# Patient Record
Sex: Female | Born: 1970
Health system: Southern US, Community
[De-identification: ages and names within clinical notes are randomized; demographics above are authoritative.]

## PROBLEM LIST (undated history)

## (undated) DIAGNOSIS — K219 Gastro-esophageal reflux disease without esophagitis: Secondary | ICD-10-CM

## (undated) DIAGNOSIS — I2699 Other pulmonary embolism without acute cor pulmonale: Secondary | ICD-10-CM

## (undated) DIAGNOSIS — K760 Fatty (change of) liver, not elsewhere classified: Secondary | ICD-10-CM

## (undated) DIAGNOSIS — J449 Chronic obstructive pulmonary disease, unspecified: Secondary | ICD-10-CM

## (undated) DIAGNOSIS — R51 Headache: Secondary | ICD-10-CM

## (undated) DIAGNOSIS — I493 Ventricular premature depolarization: Secondary | ICD-10-CM

## (undated) DIAGNOSIS — E119 Type 2 diabetes mellitus without complications: Secondary | ICD-10-CM

## (undated) DIAGNOSIS — A419 Sepsis, unspecified organism: Secondary | ICD-10-CM

## (undated) DIAGNOSIS — R0602 Shortness of breath: Secondary | ICD-10-CM

## (undated) DIAGNOSIS — A159 Respiratory tuberculosis unspecified: Secondary | ICD-10-CM

## (undated) DIAGNOSIS — E538 Deficiency of other specified B group vitamins: Secondary | ICD-10-CM

## (undated) DIAGNOSIS — I739 Peripheral vascular disease, unspecified: Secondary | ICD-10-CM

## (undated) DIAGNOSIS — M1990 Unspecified osteoarthritis, unspecified site: Secondary | ICD-10-CM

## (undated) DIAGNOSIS — F419 Anxiety disorder, unspecified: Secondary | ICD-10-CM

## (undated) DIAGNOSIS — J45909 Unspecified asthma, uncomplicated: Secondary | ICD-10-CM

## (undated) DIAGNOSIS — I509 Heart failure, unspecified: Secondary | ICD-10-CM

## (undated) DIAGNOSIS — R7989 Other specified abnormal findings of blood chemistry: Secondary | ICD-10-CM

## (undated) DIAGNOSIS — E559 Vitamin D deficiency, unspecified: Secondary | ICD-10-CM

## (undated) DIAGNOSIS — F32A Depression, unspecified: Secondary | ICD-10-CM

## (undated) DIAGNOSIS — I10 Essential (primary) hypertension: Secondary | ICD-10-CM

## (undated) DIAGNOSIS — D649 Anemia, unspecified: Secondary | ICD-10-CM

## (undated) DIAGNOSIS — I209 Angina pectoris, unspecified: Secondary | ICD-10-CM

## (undated) DIAGNOSIS — F329 Major depressive disorder, single episode, unspecified: Secondary | ICD-10-CM

## (undated) HISTORY — DX: Vitamin D deficiency, unspecified: E55.9

## (undated) HISTORY — DX: Other specified abnormal findings of blood chemistry: R79.89

## (undated) HISTORY — DX: Essential (primary) hypertension: I10

## (undated) HISTORY — DX: Type 2 diabetes mellitus without complications: E11.9

## (undated) HISTORY — DX: Other pulmonary embolism without acute cor pulmonale: I26.99

## (undated) HISTORY — DX: Deficiency of other specified B group vitamins: E53.8

## (undated) HISTORY — DX: Ventricular premature depolarization: I49.3

## (undated) HISTORY — DX: Heart failure, unspecified: I50.9

## (undated) HISTORY — PX: CHOLECYSTECTOMY: SHX55

## (undated) HISTORY — PX: DIAGNOSTIC LAPAROSCOPY: SUR761

## (undated) HISTORY — DX: Peripheral vascular disease, unspecified: I73.9

## (undated) HISTORY — PX: APPENDECTOMY: SHX54

---

## 2005-04-01 ENCOUNTER — Ambulatory Visit: Payer: Self-pay | Admitting: Cardiology

## 2011-07-28 DIAGNOSIS — J189 Pneumonia, unspecified organism: Secondary | ICD-10-CM

## 2011-07-28 HISTORY — DX: Pneumonia, unspecified organism: J18.9

## 2011-07-28 HISTORY — PX: APPENDECTOMY: SHX54

## 2012-01-28 ENCOUNTER — Inpatient Hospital Stay (HOSPITAL_COMMUNITY): Payer: Medicaid - Out of State

## 2012-01-28 ENCOUNTER — Inpatient Hospital Stay (HOSPITAL_COMMUNITY)
Admission: AD | Admit: 2012-01-28 | Discharge: 2012-02-06 | DRG: 862 | Disposition: A | Discharge status: Home / Self Care | Payer: Medicaid - Out of State | Source: Other Acute Inpatient Hospital | Attending: Internal Medicine | Admitting: Internal Medicine

## 2012-01-28 ENCOUNTER — Encounter (HOSPITAL_COMMUNITY): Payer: Self-pay | Admitting: *Deleted

## 2012-01-28 DIAGNOSIS — K35209 Acute appendicitis with generalized peritonitis, without abscess, unspecified as to perforation: Secondary | ICD-10-CM | POA: Diagnosis present

## 2012-01-28 DIAGNOSIS — E876 Hypokalemia: Secondary | ICD-10-CM | POA: Diagnosis present

## 2012-01-28 DIAGNOSIS — A419 Sepsis, unspecified organism: Secondary | ICD-10-CM | POA: Diagnosis present

## 2012-01-28 DIAGNOSIS — Y836 Removal of other organ (partial) (total) as the cause of abnormal reaction of the patient, or of later complication, without mention of misadventure at the time of the procedure: Secondary | ICD-10-CM | POA: Diagnosis present

## 2012-01-28 DIAGNOSIS — J9601 Acute respiratory failure with hypoxia: Secondary | ICD-10-CM | POA: Diagnosis present

## 2012-01-28 DIAGNOSIS — Y9210 Unspecified residential institution as the place of occurrence of the external cause: Secondary | ICD-10-CM | POA: Diagnosis present

## 2012-01-28 DIAGNOSIS — J189 Pneumonia, unspecified organism: Secondary | ICD-10-CM | POA: Diagnosis present

## 2012-01-28 DIAGNOSIS — T8140XA Infection following a procedure, unspecified, initial encounter: Principal | ICD-10-CM | POA: Diagnosis present

## 2012-01-28 DIAGNOSIS — R6520 Severe sepsis without septic shock: Secondary | ICD-10-CM | POA: Diagnosis present

## 2012-01-28 DIAGNOSIS — J4489 Other specified chronic obstructive pulmonary disease: Secondary | ICD-10-CM | POA: Diagnosis present

## 2012-01-28 DIAGNOSIS — J9589 Other postprocedural complications and disorders of respiratory system, not elsewhere classified: Secondary | ICD-10-CM

## 2012-01-28 DIAGNOSIS — Z6832 Body mass index (BMI) 32.0-32.9, adult: Secondary | ICD-10-CM

## 2012-01-28 DIAGNOSIS — K219 Gastro-esophageal reflux disease without esophagitis: Secondary | ICD-10-CM | POA: Diagnosis present

## 2012-01-28 DIAGNOSIS — F3289 Other specified depressive episodes: Secondary | ICD-10-CM | POA: Diagnosis present

## 2012-01-28 DIAGNOSIS — E46 Unspecified protein-calorie malnutrition: Secondary | ICD-10-CM | POA: Diagnosis present

## 2012-01-28 DIAGNOSIS — F329 Major depressive disorder, single episode, unspecified: Secondary | ICD-10-CM | POA: Diagnosis present

## 2012-01-28 DIAGNOSIS — E119 Type 2 diabetes mellitus without complications: Secondary | ICD-10-CM | POA: Diagnosis present

## 2012-01-28 DIAGNOSIS — J80 Acute respiratory distress syndrome: Secondary | ICD-10-CM

## 2012-01-28 DIAGNOSIS — J9600 Acute respiratory failure, unspecified whether with hypoxia or hypercapnia: Secondary | ICD-10-CM

## 2012-01-28 DIAGNOSIS — K37 Unspecified appendicitis: Secondary | ICD-10-CM | POA: Diagnosis present

## 2012-01-28 DIAGNOSIS — D649 Anemia, unspecified: Secondary | ICD-10-CM | POA: Diagnosis present

## 2012-01-28 DIAGNOSIS — J449 Chronic obstructive pulmonary disease, unspecified: Secondary | ICD-10-CM | POA: Diagnosis present

## 2012-01-28 DIAGNOSIS — I739 Peripheral vascular disease, unspecified: Secondary | ICD-10-CM | POA: Diagnosis present

## 2012-01-28 DIAGNOSIS — M129 Arthropathy, unspecified: Secondary | ICD-10-CM | POA: Diagnosis present

## 2012-01-28 DIAGNOSIS — F411 Generalized anxiety disorder: Secondary | ICD-10-CM | POA: Diagnosis present

## 2012-01-28 DIAGNOSIS — J45909 Unspecified asthma, uncomplicated: Secondary | ICD-10-CM | POA: Diagnosis present

## 2012-01-28 DIAGNOSIS — K352 Acute appendicitis with generalized peritonitis, without abscess: Secondary | ICD-10-CM | POA: Diagnosis present

## 2012-01-28 DIAGNOSIS — R6521 Severe sepsis with septic shock: Secondary | ICD-10-CM | POA: Diagnosis present

## 2012-01-28 DIAGNOSIS — I10 Essential (primary) hypertension: Secondary | ICD-10-CM | POA: Diagnosis present

## 2012-01-28 HISTORY — DX: Anemia, unspecified: D64.9

## 2012-01-28 HISTORY — DX: Gastro-esophageal reflux disease without esophagitis: K21.9

## 2012-01-28 HISTORY — DX: Type 2 diabetes mellitus without complications: E11.9

## 2012-01-28 HISTORY — DX: Headache: R51

## 2012-01-28 HISTORY — DX: Anxiety disorder, unspecified: F41.9

## 2012-01-28 HISTORY — DX: Chronic obstructive pulmonary disease, unspecified: J44.9

## 2012-01-28 HISTORY — DX: Angina pectoris, unspecified: I20.9

## 2012-01-28 HISTORY — DX: Unspecified asthma, uncomplicated: J45.909

## 2012-01-28 HISTORY — DX: Essential (primary) hypertension: I10

## 2012-01-28 HISTORY — DX: Shortness of breath: R06.02

## 2012-01-28 HISTORY — DX: Major depressive disorder, single episode, unspecified: F32.9

## 2012-01-28 HISTORY — DX: Peripheral vascular disease, unspecified: I73.9

## 2012-01-28 HISTORY — DX: Unspecified osteoarthritis, unspecified site: M19.90

## 2012-01-28 HISTORY — DX: Depression, unspecified: F32.A

## 2012-01-28 HISTORY — DX: Respiratory tuberculosis unspecified: A15.9

## 2012-01-28 LAB — POCT I-STAT 3, ART BLOOD GAS (G3+)
TCO2: 18 mmol/L (ref 0–100)
pCO2 arterial: 34.6 mmHg — ABNORMAL LOW (ref 35.0–45.0)
pH, Arterial: 7.295 — ABNORMAL LOW (ref 7.350–7.450)
pO2, Arterial: 84 mmHg (ref 80.0–100.0)

## 2012-01-28 LAB — GLUCOSE, CAPILLARY: Glucose-Capillary: 236 mg/dL — ABNORMAL HIGH (ref 70–99)

## 2012-01-28 LAB — COMPREHENSIVE METABOLIC PANEL
ALT: 12 U/L (ref 0–35)
Alkaline Phosphatase: 112 U/L (ref 39–117)
BUN: 15 mg/dL (ref 6–23)
CO2: 17 mEq/L — ABNORMAL LOW (ref 19–32)
Chloride: 102 mEq/L (ref 96–112)
GFR calc Af Amer: >90 mL/min (ref >90)
GFR calc non Af Amer: >90 mL/min (ref >90)
Glucose, Bld: 272 mg/dL — ABNORMAL HIGH (ref 70–99)
Potassium: 4.7 mEq/L (ref 3.5–5.1)
Sodium: 135 mEq/L (ref 135–145)
Total Bilirubin: 0.1 mg/dL — ABNORMAL LOW (ref 0.3–1.2)
Total Protein: 5.2 g/dL — ABNORMAL LOW (ref 6.0–8.3)

## 2012-01-28 LAB — CBC WITH DIFFERENTIAL/PLATELET
Basophils Relative: 0 % (ref 0–1)
Eosinophils Absolute: 0 10*3/uL (ref 0.0–0.7)
HCT: 27.6 % — ABNORMAL LOW (ref 36.0–46.0)
Hemoglobin: 8.9 g/dL — ABNORMAL LOW (ref 12.0–15.0)
Lymphocytes Relative: 12 % (ref 12–46)
Lymphs Abs: 1.3 10*3/uL (ref 0.7–4.0)
MCH: 32.2 pg (ref 26.0–34.0)
MCHC: 32.2 g/dL (ref 30.0–36.0)
MCV: 100 fL (ref 78.0–100.0)
Neutro Abs: 9.6 10*3/uL — ABNORMAL HIGH (ref 1.7–7.7)
RDW: 15.2 % (ref 11.5–15.5)

## 2012-01-28 LAB — LACTIC ACID, PLASMA: Lactic Acid, Venous: 0.7 mmol/L (ref 0.5–2.2)

## 2012-01-28 LAB — LIPASE, BLOOD: Lipase: 41 U/L (ref 11–59)

## 2012-01-28 LAB — CARDIAC PANEL(CRET KIN+CKTOT+MB+TROPI)
CK, MB: 1.3 ng/mL (ref 0.3–4.0)
Total CK: 31 U/L (ref 7–177)
Troponin I: <0.3 ng/mL (ref <0.30)

## 2012-01-28 LAB — PROTIME-INR: INR: 1.23 (ref 0.00–1.49)

## 2012-01-28 MED ORDER — MIDAZOLAM HCL 2 MG/2ML IJ SOLN
INTRAMUSCULAR | Status: AC
  Administered 2012-01-28: 4 mg
  Filled 2012-01-28: qty 4

## 2012-01-28 MED ORDER — DEXTROSE 10 % IV SOLN: INTRAVENOUS | Status: DC | PRN

## 2012-01-28 MED ORDER — FENTANYL CITRATE 0.05 MG/ML IJ SOLN
INTRAMUSCULAR | Status: AC
  Administered 2012-01-28: 100 ug
  Filled 2012-01-28: qty 4

## 2012-01-28 MED ORDER — ADULT MULTIVITAMIN LIQUID CH
5.0000 mL | Freq: Every day | ORAL | Status: DC
  Administered 2012-01-28 – 2012-02-05 (×8): 5 mL
  Filled 2012-01-28 (×9): qty 5

## 2012-01-28 MED ORDER — OXEPA PO LIQD
1000.0000 mL | ORAL | Status: DC
  Administered 2012-01-28 – 2012-02-01 (×5): 1000 mL
  Filled 2012-01-28 (×8): qty 1000

## 2012-01-28 MED ORDER — HEPARIN SODIUM (PORCINE) 5000 UNIT/ML IJ SOLN
5000.0000 [IU] | Freq: Three times a day (TID) | INTRAMUSCULAR | Status: DC
  Administered 2012-01-28 – 2012-02-06 (×28): 5000 [IU] via SUBCUTANEOUS
  Filled 2012-01-28 (×32): qty 1

## 2012-01-28 MED ORDER — NOREPINEPHRINE BITARTRATE 1 MG/ML IJ SOLN
2.0000 ug/min | INTRAVENOUS | Status: DC
  Administered 2012-01-28: 10 ug/min via INTRAVENOUS
  Administered 2012-01-28 – 2012-01-29 (×2): 8 ug/min via INTRAVENOUS
  Administered 2012-01-29: 15 ug/min via INTRAVENOUS
  Administered 2012-01-30: 8 ug/min via INTRAVENOUS
  Filled 2012-01-28 (×10): qty 4

## 2012-01-28 MED ORDER — SODIUM CHLORIDE 0.9 % IV SOLN
2.0000 mg/h | INTRAVENOUS | Status: DC
  Administered 2012-01-28: 2 mg/h via INTRAVENOUS
  Administered 2012-01-28 – 2012-01-29 (×4): 4 mg/h via INTRAVENOUS
  Administered 2012-01-29: 8 mg/h via INTRAVENOUS
  Administered 2012-01-30 (×2): 4 mg/h via INTRAVENOUS
  Administered 2012-01-31: 5 mg/h via INTRAVENOUS
  Administered 2012-01-31: 4 mg/h via INTRAVENOUS
  Administered 2012-02-01 (×4): 8 mg/h via INTRAVENOUS
  Administered 2012-02-02: 5 mg/h via INTRAVENOUS
  Administered 2012-02-02: 2.5 mg/h via INTRAVENOUS
  Administered 2012-02-02: 3 mg/h via INTRAVENOUS
  Administered 2012-02-02: 10 mg/h via INTRAVENOUS
  Filled 2012-01-28 (×19): qty 10

## 2012-01-28 MED ORDER — FUROSEMIDE 10 MG/ML IJ SOLN
40.0000 mg | Freq: Once | INTRAMUSCULAR | Status: AC
  Administered 2012-01-28: 40 mg via INTRAVENOUS
  Filled 2012-01-28: qty 4

## 2012-01-28 MED ORDER — PRO-STAT SUGAR FREE PO LIQD
30.0000 mL | Freq: Every day | ORAL | Status: DC
  Administered 2012-01-30 – 2012-02-02 (×4): 30 mL
  Filled 2012-01-28 (×8): qty 30

## 2012-01-28 MED ORDER — INSULIN ASPART 100 UNIT/ML ~~LOC~~ SOLN
0.0000 [IU] | SUBCUTANEOUS | Status: DC
  Administered 2012-01-28 (×2): 10 [IU] via SUBCUTANEOUS

## 2012-01-28 MED ORDER — CHLORHEXIDINE GLUCONATE 0.12 % MT SOLN
15.0000 mL | Freq: Two times a day (BID) | OROMUCOSAL | Status: DC
  Administered 2012-01-28 – 2012-02-04 (×14): 15 mL via OROMUCOSAL
  Filled 2012-01-28 (×16): qty 15

## 2012-01-28 MED ORDER — PRO-STAT SUGAR FREE PO LIQD
60.0000 mL | Freq: Three times a day (TID) | ORAL | Status: DC
  Administered 2012-01-28 – 2012-02-03 (×18): 60 mL
  Filled 2012-01-28 (×24): qty 60

## 2012-01-28 MED ORDER — IOHEXOL 300 MG/ML  SOLN
100.0000 mL | Freq: Once | INTRAMUSCULAR | Status: AC | PRN
  Administered 2012-01-28: 100 mL via INTRAVENOUS

## 2012-01-28 MED ORDER — SODIUM CHLORIDE 0.9 % IV SOLN
500.0000 mg | Freq: Four times a day (QID) | INTRAVENOUS | Status: AC
  Administered 2012-01-28 – 2012-02-06 (×36): 500 mg via INTRAVENOUS
  Filled 2012-01-28 (×37): qty 500

## 2012-01-28 MED ORDER — FAMOTIDINE IN NACL 20-0.9 MG/50ML-% IV SOLN
20.0000 mg | Freq: Two times a day (BID) | INTRAVENOUS | Status: DC
  Administered 2012-01-28 – 2012-01-29 (×3): 20 mg via INTRAVENOUS
  Filled 2012-01-28 (×4): qty 50

## 2012-01-28 MED ORDER — SODIUM CHLORIDE 0.9 % IV SOLN
INTRAVENOUS | Status: AC
  Administered 2012-01-28: 06:00:00 via INTRAVENOUS
  Administered 2012-01-29 (×2): 20 mL/h via INTRAVENOUS

## 2012-01-28 MED ORDER — MIDAZOLAM BOLUS VIA INFUSION
1.0000 mg | INTRAVENOUS | Status: DC | PRN
  Filled 2012-01-28 (×2): qty 2

## 2012-01-28 MED ORDER — VANCOMYCIN HCL IN DEXTROSE 1-5 GM/200ML-% IV SOLN
1000.0000 mg | Freq: Three times a day (TID) | INTRAVENOUS | Status: DC
  Administered 2012-01-28 – 2012-01-30 (×7): 1000 mg via INTRAVENOUS
  Filled 2012-01-28 (×10): qty 200

## 2012-01-28 MED ORDER — INSULIN ASPART 100 UNIT/ML ~~LOC~~ SOLN
3.0000 [IU] | SUBCUTANEOUS | Status: DC
  Administered 2012-01-28 (×2): 3 [IU] via SUBCUTANEOUS

## 2012-01-28 MED ORDER — ACETAMINOPHEN 160 MG/5ML PO SOLN
650.0000 mg | Freq: Four times a day (QID) | ORAL | Status: DC | PRN
  Administered 2012-01-28 – 2012-01-29 (×2): 650 mg
  Filled 2012-01-28 (×3): qty 20.3

## 2012-01-28 MED ORDER — BIOTENE DRY MOUTH MT LIQD
15.0000 mL | Freq: Four times a day (QID) | OROMUCOSAL | Status: DC
  Administered 2012-01-29 – 2012-02-04 (×27): 15 mL via OROMUCOSAL

## 2012-01-28 MED ORDER — SODIUM CHLORIDE 0.9 % IV SOLN
250.0000 mL | INTRAVENOUS | Status: DC | PRN
  Administered 2012-01-30: 250 mL via INTRAVENOUS

## 2012-01-28 MED ORDER — INSULIN ASPART 100 UNIT/ML ~~LOC~~ SOLN
0.0000 [IU] | SUBCUTANEOUS | Status: DC
  Administered 2012-01-28 (×2): 4 [IU] via SUBCUTANEOUS

## 2012-01-28 MED ORDER — IOHEXOL 300 MG/ML  SOLN
20.0000 mL | INTRAMUSCULAR | Status: AC
  Administered 2012-01-28 (×2): 20 mL via ORAL

## 2012-01-28 MED ORDER — IPRATROPIUM-ALBUTEROL 18-103 MCG/ACT IN AERO
6.0000 | INHALATION_SPRAY | RESPIRATORY_TRACT | Status: DC
  Administered 2012-01-28 – 2012-02-03 (×37): 6 via RESPIRATORY_TRACT
  Filled 2012-01-28 (×2): qty 14.7

## 2012-01-28 MED ORDER — SODIUM CHLORIDE 0.9 % IV SOLN
50.0000 ug/h | INTRAVENOUS | Status: DC
  Administered 2012-01-28: 75 ug/h via INTRAVENOUS
  Administered 2012-01-28 – 2012-01-29 (×3): 200 ug/h via INTRAVENOUS
  Administered 2012-01-30 – 2012-01-31 (×2): 150 ug/h via INTRAVENOUS
  Administered 2012-01-31 – 2012-02-01 (×2): 200 ug/h via INTRAVENOUS
  Administered 2012-02-01: 250 ug/h via INTRAVENOUS
  Administered 2012-02-02: 125 ug/h via INTRAVENOUS
  Administered 2012-02-02: 100 ug/h via INTRAVENOUS
  Administered 2012-02-03: 200 ug/h via INTRAVENOUS
  Filled 2012-01-28 (×13): qty 50

## 2012-01-28 MED ORDER — FENTANYL BOLUS VIA INFUSION
50.0000 ug | Freq: Four times a day (QID) | INTRAVENOUS | Status: DC | PRN
  Filled 2012-01-28: qty 100

## 2012-01-28 NOTE — Progress Notes (Addendum)
INITIAL ADULT NUTRITION ASSESSMENT Date: 01/28/2012   Time: 9:29 AM Reason for Assessment: TF Management Consult  INTERVENTION: Initiate Oxepa @ 20 ml/hr via nasogastric feeding tube. 60 ml Prostat TID and 30 ml Prostat daily.  At goal rate, tube feeding regimen will provide 1420 kcal (22 kcal/kg IBW), 135 grams of protein (>100% of minimum needs), and 376 ml of H2O.   Adult liquid MVI daily   ASSESSMENT: Female 41 y.o.  Dx: Acute respiratory failure with hypoxia  Hx:  Past Medical History  Diagnosis Date  . Peripheral vascular disease   . Hypertension   . Anginal pain   . Anxiety   . Depression   . Asthma   . COPD (chronic obstructive pulmonary disease)   . Shortness of breath   . Tuberculosis   . GERD (gastroesophageal reflux disease)   . Headache   . Arthritis   . Diabetes mellitus 01/28/2012   Past Surgical History  Procedure Date  . Diagnostic laparoscopy     Related Meds:     . albuterol-ipratropium  6 puff Inhalation Q4H  . famotidine (PEPCID) IV  20 mg Intravenous Q12H  . fentaNYL      . furosemide  40 mg Intravenous Once  . heparin  5,000 Units Subcutaneous Q8H  . imipenem-cilastatin  500 mg Intravenous Q6H  . insulin aspart  0-4 Units Subcutaneous Q4H  . insulin aspart  3 Units Subcutaneous Q4H  . iohexol  20 mL Oral Q1 Hr x 2  . midazolam      . vancomycin  1,000 mg Intravenous Q8H    Ht: 5\' 8"  (172.7 cm)  Wt: 215 lb 2.7 oz (97.6 kg)  Ideal Wt: 63.6 kg % Ideal Wt: 153%  Usual Wt: unknown  % Usual Wt:    Body mass index is 32.72 kg/(m^2). Obesity Class I  Food/Nutrition Related Hx: no family present. Pt sedated on vent. Per RN will start TF after pt has CT.   Labs:  CMP     Component Value Date/Time   NA 135 01/28/2012 0644   K 4.7 01/28/2012 0644   CL 102 01/28/2012 0644   CO2 17* 01/28/2012 0644   GLUCOSE 272* 01/28/2012 0644   BUN 15 01/28/2012 0644   CREATININE 0.63 01/28/2012 0644   CALCIUM 8.2* 01/28/2012 0644   PROT 5.2* 01/28/2012 0644   ALBUMIN 1.8* 01/28/2012 0644   AST 19 01/28/2012 0644   ALT 12 01/28/2012 0644   ALKPHOS 112 01/28/2012 0644   BILITOT 0.1* 01/28/2012 0644   GFRNONAA >90 01/28/2012 0644   GFRAA >90 01/28/2012 0644   CBG (last 3)   Basename 01/28/12 0745 01/28/12 0530  GLUCAP 236* 262*    No results found for this basename: phos   No results found for this basename: mg   No results found for this basename: HGBA1C    Intake/Output Summary (Last 24 hours) at 01/28/12 0932 Last data filed at 01/28/12 0800  Gross per 24 hour  Intake 139.55 ml  Output    285 ml  Net -145.45 ml    Diet Order: NPO  Supplements/Tube Feeding: none  IVF:    sodium chloride Last Rate: 20 mL/hr (01/28/12 0820)  dextrose   fentaNYL infusion INTRAVENOUS Last Rate: 200 mcg/hr (01/28/12 0815)  midazolam (VERSED) infusion Last Rate: 4 mg/hr (01/28/12 0800)   Pt with obesity and diabetes was admitted to [facility] on 7/1 with a ruptured appendix. She underwent a lap appy on 7/1 and on 7/3 developed  hypoxemic respiratory failure requiring intubation. She was transferred to [facility] for further management on 7/4. Pt with ARDS likely due to HCAP vs sepsis from appendicitis. Patient is currently intubated on ventilator support.  MV: 10.4 Temp: 37.1 C Pt with NG tube in place, no xray available.   Estimated Nutritional Needs:   Kcal:  1925 Protein:  >/=127 grams Fluid:  >2 L/day  NUTRITION DIAGNOSIS: -Inadequate oral intake (NI-2.1).  Status: Ongoing  RELATED TO: inability to eat  AS EVIDENCE BY: NPO status  MONITORING/EVALUATION(Goals): Goal: Enteral nutrition to provide 60-70% of estimated calorie needs (22-25 kcals/kg ideal body weight) and 100% of estimated protein needs, based on ASPEN guidelines for permissive underfeeding in critically ill obese individuals.  Monitor: TF tolerance, vent status, I&O  EDUCATION NEEDS: -No education needs identified at this time   DOCUMENTATION CODES Per approved criteria    -Obesity Unspecified    Kendell Bane RD, LDN, CNSC [phone] Pager [phone] After Hours Pager  01/28/2012, 9:29 AM

## 2012-01-28 NOTE — Progress Notes (Signed)
1742 Temp 100.6 Dr. Delford Field ordered Tylenol. RN will give when approved by pharmacy. Pls see MAR

## 2012-01-28 NOTE — Progress Notes (Signed)
1100 Discussed MAP <60 with Dr. Delford Field. Norepinephrine ordered to maintain MAP >65. Will start as soon as drip sent from pharmacy.   01/28/12 1100  Vitals  Pulse Rate 72   Pulse Rate Source Monitor  ECG Heart Rate 73   Cardiac Rhythm NSR  BP ! 100/43 mmHg  MAP (mmHg) 57   BP Location Left arm  BP Method Automatic  Patient Position, if appropriate Lying  Resp 16

## 2012-01-28 NOTE — Procedures (Signed)
Central Venous Catheter Insertion Procedure Note BLUE RUGGERIO [id number] 06/24/1971  Procedure: Insertion of Central Venous Catheter Indications: Assessment of intravascular volume and Drug and/or fluid administration  Procedure Details Consent: Risks of procedure as well as the alternatives and risks of each were explained to the (patient/caregiver).  Consent for procedure obtained. Time Out: Verified patient identification, verified procedure, site/side was marked, verified correct patient position, special equipment/implants available, medications/allergies/relevent history reviewed, required imaging and test results available.  Performed  Maximum sterile technique was used including antiseptics, cap, gloves, gown, hand hygiene, mask and sheet. Skin prep: Chlorhexidine; local anesthetic administered A antimicrobial bonded/coated triple lumen catheter was placed in the right internal jugular vein using the Seldinger technique. Ultrasound used for vessel identification.  Evaluation Blood flow good Complications: No apparent complications Patient did tolerate procedure well. Chest X-ray ordered to verify placement.  CXR: pending.  Naseem Varden 01/28/2012, 6:06 AM

## 2012-01-28 NOTE — Progress Notes (Signed)
ANTIBIOTIC CONSULT NOTE - INITIAL  Pharmacy Consult for Vancomycin/Primaxin Indication: rule out sepsis  No Known Allergies  Patient Measurements: Height: 5\' 8"  (172.7 cm) Weight: 215 lb 2.7 oz (97.6 kg) IBW/kg (Calculated) : 63.9  Adjusted Body Weight: 80  Vital Signs: Temp: 98.8 F (37.1 C) (07/04 0546) Temp src: Axillary (07/04 0546) BP: 101/47 mmHg (07/04 0645) Pulse Rate: 81  (07/04 0645) Intake/Output from previous day: 07/03 0701 - 07/04 0700 In: 55.1 [I.V.:55.1] Out: 185 [Urine:185] Intake/Output from this shift:    Labs (at [facility]): SCr 0.71  No results found for this basename: WBC:3,HGB:3,PLT:3,LABCREA:3,CREATININE:3 in the last 72 hours CrCl is unknown because no creatinine reading has been taken. No results found for this basename: VANCOTROUGH:2,VANCOPEAK:2,VANCORANDOM:2,GENTTROUGH:2,GENTPEAK:2,GENTRANDOM:2,TOBRATROUGH:2,TOBRAPEAK:2,TOBRARND:2,AMIKACINPEAK:2,AMIKACINTROU:2,AMIKACIN:2, in the last 72 hours   Microbiology: No results found for this or any previous visit (from the past 720 hour(s)).  Medical History: Past Medical History  Diagnosis Date  . Peripheral vascular disease   . Hypertension   . Anginal pain   . Anxiety   . Depression   . Asthma   . COPD (chronic obstructive pulmonary disease)   . Shortness of breath   . Tuberculosis   . GERD (gastroesophageal reflux disease)   . Headache   . Arthritis   . Diabetes mellitus 01/28/2012    Home Medications:  Metformin  Zantac  Lortab  Zestoretic  ASA  Coreg  Gabapentin  Assessment: 41 yo female with ARDS/sepsis for empiric antibiotics Antibiotics at [facility]: Vancomycin 1 g IV q8h   Last dose at 0330 7/4 Invanz 1 g IV q24h    Last dose at 2130 7/3 Azithromycin 250 mg IV q24h   Last dose at 2130 7/3  Goal of Therapy:  Vancomycin trough level 15-20 mcg/ml  Plan:  Continue Vancomycin 1g IV q8h Primaxin 500 mg IV q6h  Eddie Candle 01/28/2012,7:31 AM

## 2012-01-28 NOTE — H&P (Addendum)
Name: [name] MRN: [id number] DOB: [birth year]    LOS: 0  Referring Provider:  [facility] Reason for Referral:  Acute respiratory failure  PULMONARY / CRITICAL CARE MEDICINE  HPI:  This is a 41 y/o female with obesity and diabetes who was admitted to [facility] on 7/1 with a ruptured appendix.  She underwent a lap appy on 7/1 and on 7/3 developed hypoxemic respiratory failure requiring intubation.  She was transferred to [facility] for further management on 7/4.  Past Medical History  Diagnosis Date  . Peripheral vascular disease   . Hypertension   . Anginal pain   . Anxiety   . Depression   . Asthma   . COPD (chronic obstructive pulmonary disease)   . Shortness of breath   . Tuberculosis   . GERD (gastroesophageal reflux disease)   . Headache   . Arthritis    Past Surgical History  Procedure Date  . Diagnostic laparoscopy    Prior to Admission medications   Not on File   Allergies No Known Allergies  Family History History reviewed. No pertinent family history. Social History  does not have a smoking history on file. She does not have any smokeless tobacco history on file. She reports that she does not drink alcohol or use illicit drugs.  Review Of Systems:  Cannot obtain due to intubation  Brief patient description:  41 y/o female with DM2, who developed ARDS on 7/3 after a lap appy on 7/1 at [facility].  She was transferred to [facility] on 01/28/2012.  Events Since Admission: 7/3 CT Ab>> bibasilar airspace disease, fluid near appy site and drainage catheter, unremarkable small bowel, stomach, colon.    Current Status:  Vital Signs: Temp:  [98.8 F (37.1 C)] 98.8 F (37.1 C) (07/04 0546) Pulse Rate:  [82-93] 82  (07/04 0600) Resp:  [19-23] 19  (07/04 0600) BP: (103-117)/(47-59) 103/47 mmHg (07/04 0600) SpO2:  [96 %] 96 % (07/04 0600) FiO2 (%):  [100 %] 100 % (07/04 0600)  Physical Examination: Gen: sedated on vent HEENT: NCAT, PERRL,  EOMi PULM: Insp crackles L > R CV: RRR, no mgr, no JVD AB: BS infrequent, soft, nontender,  Ext: warm, no edema, no clubbing, no cyanosis Derm: no rash or skin breakdown Neuro: A&Ox4, CN II-XII intact, strength 5/5 in all 4 extremities   Principal Problem:  *Acute respiratory failure with hypoxia Active Problems:  Appendicitis  ARDS (adult respiratory distress syndrome)  Diabetes mellitus  Asthma  Hypertension   ASSESSMENT AND PLAN  PULMONARY No results found for this basename: PHART:5,PCO2:5,PCO2ART:5,PO2ART:5,HCO3:5,O2SAT:5 in the last 168 hours Ventilator Settings: Vent Mode:  [-]  FiO2 (%):  [100 %] 100 % CXR:  Bilateral airspace disease ETT:  7/3 >>  A:  ARDS likely due to HCAP vs. Sepsis from appendicitis; Asthma without exacerbation P:   -ARDS protocol -goal CVP < 4 -cont sedation protocol -see ID for antibiotics -daily WUA/SBT/CXR/ABG -scheduled bronchodilators  CARDIOVASCULAR No results found for this basename: TROPONINI:5,LATICACIDVEN:5, O2SATVEN:5,PROBNP:5 in the last 168 hours ECG:  pending Lines: 7/4 R IJ CVL >>  A: No acute issues P:  -Keep MAP > 65 -Keep CVP < 4 for ARDS  RENAL No results found for this basename: NA:5,K:2,CL:5,CO2:5,BUN:5,CREATININE:5,CALCIUM:5,MG:5,PHOS:5 in the last 168 hours Intake/Output    None    Foley:  7/4  A:  No acute issues P:   -BMET -follow uop  GASTROINTESTINAL No results found for this basename: AST:5,ALT:5,ALKPHOS:5,BILITOT:5,PROT:5,ALBUMIN:5 in the last 168 hours  A:  S/p Appy, protein calorie malnutrition P:   -tube feedings -follow JP drain output -see ID -check CMP  HEMATOLOGIC No results found for this basename: HGB:5,HCT:5,PLT:5,INR:5,APTT:5 in the last 168 hours A:  No acute issues P:  -follow cbc  INFECTIOUS No results found for this basename: WBC:5,PROCALCITON:5 in the last 168 hours Cultures: 7/2 resp >> 7/2 blood >> Antibiotics: 7/2 Vanc HCAP >> 7/2 Imi (HCAP/Appendicitis)  >>  A:  HCAP and appendicitis P:   -vanc/imi -follow blood and resp cultures -follow JP drain output  ENDOCRINE  Lab 01/28/12 0530  GLUCAP 262*   A:  DM2 P:   -ICU hyperglycemia protocol  NEUROLOGIC  A:  Sedation needs P:   -continuous sedation protocol  BEST PRACTICE / DISPOSITION Level of Care:  ICU Primary Service:  PCCM Consultants:   Code Status:  full Diet:  Enteral feedings DVT Px:  Sub q hep GI Px:  pepcid Skin Integrity:  normal Social / Family:  Need to update when they arrive on 7/4  CC time 60 minutes.  Max Fickle, M.D. Pulmonary and Critical Care Medicine [facility] Pager: [phone]  01/28/2012, 6:07 AM

## 2012-01-28 NOTE — Progress Notes (Signed)
1742 Arterial Line insertion by Earnest Rosier NP on 1st attempt.

## 2012-01-28 NOTE — Progress Notes (Signed)
1145 Norepinephrine gtt started to maintain MAP >65. Will continue to monitor patient throughout shift and alert oncoming RN of new gtt and MAP goal.

## 2012-01-28 NOTE — Consult Note (Signed)
[name] DOB: [birth year] MRN: [id number]                                                                                      DATE: 01/28/2012  PCP: No primary provider on file. Referring Provider: No ref. provider found  IMPRESSION:  S/P appendectomy with respiratory failure. Does not appear to have an intra-abdominal source of infection other than resolving appendicitis for appendectomy.  PLAN:   No surgical needs now. Will monitor drain output and abdominal exam.                    HPI:  [name] is a 41 y.o.  female who presents for evaluation of post op sepsis. She had an appendectomy a few days ago and has developed respiratory failure, sepsis. We are asked to evaluate for abdominal issues. She is on a vent and sedated and can't communicate at this point  PMH:  has a past medical history of Peripheral vascular disease; Hypertension; Anginal pain; Anxiety; Depression; Asthma; COPD (chronic obstructive pulmonary disease); Shortness of breath; Tuberculosis; GERD (gastroesophageal reflux disease); Headache; Arthritis; and Diabetes mellitus (01/28/2012).  PSH:   has past surgical history that includes Diagnostic laparoscopy.  ALLERGIES:  No Known Allergies  MEDICATIONS: Current facility-administered medications:0.9 %  sodium chloride infusion, 250 mL, Intravenous, PRN, Lupita Leash, MD;  0.9 %  sodium chloride infusion, , Intravenous, Continuous, Storm Frisk, MD, Last Rate: 20 mL/hr at 01/28/12 0820, 20 mL/hr at 01/28/12 0820;  albuterol-ipratropium (COMBIVENT) inhaler 6 puff, 6 puff, Inhalation, Q4H, Lupita Leash, MD, 6 puff at 01/28/12 1139 dextrose 10 % infusion, , Intravenous, Continuous PRN, Lupita Leash, MD;  famotidine (PEPCID) IVPB 20 mg, 20 mg, Intravenous, Q12H, Lupita Leash, MD, 20 mg at 01/28/12 1010;  feeding supplement (OXEPA) liquid 1,000 mL, 1,000 mL, Per Tube, Q24H, Heather Cornelison Pitts, RD;  feeding supplement (PRO-STAT SUGAR  FREE 64) liquid 30 mL, 30 mL, Per Tube, Daily, Heather Cornelison Pitts, RD feeding supplement (PRO-STAT SUGAR FREE 64) liquid 60 mL, 60 mL, Per Tube, TID, Heather Cornelison Pitts, RD, 60 mL at 01/28/12 1322;  fentaNYL (SUBLIMAZE) 0.05 MG/ML injection, , , , , 100 mcg at 01/28/12 0548;  fentaNYL (SUBLIMAZE) 10 mcg/mL in sodium chloride 0.9 % 250 mL infusion, 50-400 mcg/hr, Intravenous, Titrated, Zigmund Gottron, MD, Last Rate: 10 mL/hr at 01/28/12 0900, 100 mcg/hr at 01/28/12 0900 fentaNYL (SUBLIMAZE) bolus via infusion 50-100 mcg, 50-100 mcg, Intravenous, Q6H PRN, Zigmund Gottron, MD;  furosemide (LASIX) injection 40 mg, 40 mg, Intravenous, Once, Storm Frisk, MD, 40 mg at 01/28/12 0851;  heparin injection 5,000 Units, 5,000 Units, Subcutaneous, Q8H, Lupita Leash, MD, 5,000 Units at 01/28/12 1321 imipenem-cilastatin (PRIMAXIN) 500 mg in sodium chloride 0.9 % 100 mL IVPB, 500 mg, Intravenous, Q6H, Lupita Leash, MD, 500 mg at 01/28/12 0850;  insulin aspart (novoLOG) injection 0-4 Units, 0-4 Units, Subcutaneous, Q4H, Lupita Leash, MD, 4 Units at 01/28/12 1314;  insulin aspart (novoLOG) injection 3 Units, 3 Units, Subcutaneous, Q4H, Lupita Leash, MD, 3 Units at 01/28/12 1318 iohexol (OMNIPAQUE) 300 MG/ML solution 100  mL, 100 mL, Intravenous, Once PRN, Medication Radiologist, MD, 100 mL at 01/28/12 1248;  iohexol (OMNIPAQUE) 300 MG/ML solution 20 mL, 20 mL, Oral, Q1 Hr x 2, Medication Radiologist, MD, 20 mL at 01/28/12 1110;  midazolam (VERSED) 1 mg/mL in sodium chloride 0.9 % 50 mL infusion, 2-10 mg/hr, Intravenous, Titrated, Zigmund Gottron, MD, Last Rate: 3 mL/hr at 01/28/12 0900, 3 mg/hr at 01/28/12 0900 midazolam (VERSED) 2 MG/2ML injection, , , , , 4 mg at 01/28/12 0548;  midazolam (VERSED) bolus via infusion 1-2 mg, 1-2 mg, Intravenous, Q2H PRN, Zigmund Gottron, MD;  multivitamin liquid 5 mL, 5 mL, Per Tube, Daily, Heather Cornelison Pitts, RD, 5 mL at  01/28/12 1322 norepinephrine (LEVOPHED) 4 mg in dextrose 5 % 250 mL infusion, 2-50 mcg/min, Intravenous, Continuous, Storm Frisk, MD, Last Rate: 30 mL/hr at 01/28/12 1311, 8 mcg/min at 01/28/12 1311;  vancomycin (VANCOCIN) IVPB 1000 mg/200 mL premix, 1,000 mg, Intravenous, Q8H, Lupita Leash, MD, 1,000 mg at 01/28/12 1116  ROS: Not obtainable other than from prior notes  EXAM:   General: The patient is sedated on a ventilator. Vital signs noted. She is on low-dose Levophed. Pulse rate is 76. Lungs: Sounds clear to auscultation anteriorly. Abdomen: Soft. Seems nontender but difficult to assess due to sedation. Incisions are clean and healing nicely. The drain has serous material.  DATA REVIEWED:  I have reviewed her H&P other notes laboratory studies and CT scan. Of note is that the CT scan does not show evidence of any intra-abdominal abscess or infection as etiology for her septic picture.    [name] 01/28/2012  CC: No ref. provider found, No primary provider on file.

## 2012-01-28 NOTE — Progress Notes (Addendum)
Changes made per ards protocol increased RR to 18.

## 2012-01-28 NOTE — Procedures (Signed)
Arterial Catheter Insertion Procedure Note DEAMBER BUCKHALTER [id number] Mar 29, 1971  Procedure: Insertion of Arterial Catheter  Indications: Blood pressure monitoring and Frequent blood sampling  Procedure Details Consent: Risks of procedure as well as the alternatives and risks of each were explained to the (patient/caregiver).  Consent for procedure obtained. Time Out: Verified patient identification, verified procedure, site/side was marked, verified correct patient position, special equipment/implants available, medications/allergies/relevent history reviewed, required imaging and test results available.  Performed  Maximum sterile technique was used including antiseptics, cap, gloves, gown, hand hygiene, mask and sheet. Skin prep: Chlorhexidine; local anesthetic administered 20 gauge catheter was inserted into left radial artery using the Seldinger technique.  Evaluation Blood flow good; BP tracing good. Complications: No apparent complications.   Procedure performed under direct supervision of Dr. Delford Field and with ultrasound guidance.     Canary Brim, NP-C [facility] Pgr: [phone] or [phone]

## 2012-01-29 ENCOUNTER — Inpatient Hospital Stay (HOSPITAL_COMMUNITY): Payer: Medicaid - Out of State

## 2012-01-29 DIAGNOSIS — I509 Heart failure, unspecified: Secondary | ICD-10-CM

## 2012-01-29 LAB — COMPREHENSIVE METABOLIC PANEL
ALT: 10 U/L (ref 0–35)
AST: 18 U/L (ref 0–37)
Alkaline Phosphatase: 91 U/L (ref 39–117)
CO2: 21 mEq/L (ref 19–32)
Chloride: 104 mEq/L (ref 96–112)
GFR calc Af Amer: >90 mL/min (ref >90)
GFR calc non Af Amer: >90 mL/min (ref >90)
Glucose, Bld: 294 mg/dL — ABNORMAL HIGH (ref 70–99)
Potassium: 3.9 mEq/L (ref 3.5–5.1)
Sodium: 136 mEq/L (ref 135–145)
Total Bilirubin: 0.1 mg/dL — ABNORMAL LOW (ref 0.3–1.2)

## 2012-01-29 LAB — CBC
Hemoglobin: 9.3 g/dL — ABNORMAL LOW (ref 12.0–15.0)
MCHC: 31.1 g/dL (ref 30.0–36.0)
Platelets: 405 10*3/uL — ABNORMAL HIGH (ref 150–400)
Platelets: 368 10*3/uL (ref 150–400)
RBC: 2.74 MIL/uL — ABNORMAL LOW (ref 3.87–5.11)
RDW: 16.1 % — ABNORMAL HIGH (ref 11.5–15.5)
WBC: 14.2 10*3/uL — ABNORMAL HIGH (ref 4.0–10.5)

## 2012-01-29 LAB — GLUCOSE, CAPILLARY
Glucose-Capillary: 272 mg/dL — ABNORMAL HIGH (ref 70–99)
Glucose-Capillary: 260 mg/dL — ABNORMAL HIGH (ref 70–99)
Glucose-Capillary: 189 mg/dL — ABNORMAL HIGH (ref 70–99)
Glucose-Capillary: 128 mg/dL — ABNORMAL HIGH (ref 70–99)
Glucose-Capillary: 145 mg/dL — ABNORMAL HIGH (ref 70–99)
Glucose-Capillary: 175 mg/dL — ABNORMAL HIGH (ref 70–99)
Glucose-Capillary: 186 mg/dL — ABNORMAL HIGH (ref 70–99)
Glucose-Capillary: 251 mg/dL — ABNORMAL HIGH (ref 70–99)

## 2012-01-29 LAB — CREATININE, SERUM
Creatinine, Ser: 0.77 mg/dL (ref 0.50–1.10)
GFR calc non Af Amer: >90 mL/min (ref >90)

## 2012-01-29 LAB — PHOSPHORUS: Phosphorus: 3.3 mg/dL (ref 2.3–4.6)

## 2012-01-29 LAB — POCT I-STAT 3, ART BLOOD GAS (G3+)
Acid-base deficit: 8 mmol/L — ABNORMAL HIGH (ref 0.0–2.0)
O2 Saturation: 97 %
TCO2: 20 mmol/L (ref 0–100)
pCO2 arterial: 44.5 mmHg (ref 35.0–45.0)

## 2012-01-29 MED ORDER — INSULIN ASPART 100 UNIT/ML ~~LOC~~ SOLN
0.0000 [IU] | Freq: Three times a day (TID) | SUBCUTANEOUS | Status: DC
  Administered 2012-01-29: 2 [IU] via SUBCUTANEOUS

## 2012-01-29 MED ORDER — SODIUM CHLORIDE 0.9 % IV SOLN
100.0000 mg | Freq: Every day | INTRAVENOUS | Status: DC
  Administered 2012-01-29 – 2012-02-02 (×5): 100 mg via INTRAVENOUS
  Filled 2012-01-29 (×6): qty 100

## 2012-01-29 MED ORDER — INSULIN GLARGINE 100 UNIT/ML ~~LOC~~ SOLN
10.0000 [IU] | Freq: Every day | SUBCUTANEOUS | Status: DC
  Administered 2012-01-29 – 2012-02-03 (×6): 10 [IU] via SUBCUTANEOUS

## 2012-01-29 MED ORDER — FUROSEMIDE 10 MG/ML IJ SOLN
20.0000 mg | INTRAMUSCULAR | Status: DC | PRN
  Administered 2012-01-31 – 2012-02-01 (×4): 40 mg via INTRAVENOUS
  Administered 2012-02-03 (×2): 20 mg via INTRAVENOUS
  Filled 2012-01-29 (×5): qty 4
  Filled 2012-01-29: qty 8

## 2012-01-29 MED ORDER — STUDY - INVESTIGATIONAL DRUG SIMPLE RECORD
40.0000 mg | Freq: Once | Status: AC
  Administered 2012-01-29: 40 mg via ORAL
  Filled 2012-01-29: qty 40

## 2012-01-29 MED ORDER — FAMOTIDINE 40 MG/5ML PO SUSR
20.0000 mg | Freq: Two times a day (BID) | ORAL | Status: DC
  Administered 2012-01-29 – 2012-02-02 (×9): 20 mg
  Filled 2012-01-29 (×11): qty 2.5

## 2012-01-29 MED ORDER — VECURONIUM BOLUS VIA INFUSION
0.0800 mg/kg | Freq: Once | INTRAVENOUS | Status: AC
  Administered 2012-01-29: 8.3 mg via INTRAVENOUS
  Filled 2012-01-29: qty 9

## 2012-01-29 MED ORDER — STUDY - INVESTIGATIONAL DRUG SIMPLE RECORD
20.0000 mg | Freq: Every day | Status: DC
  Administered 2012-01-30 – 2012-02-06 (×8): 20 mg via ORAL
  Filled 2012-01-29 (×2): qty 20

## 2012-01-29 MED ORDER — DEXTROSE 5 % IV SOLN
INTRAVENOUS | Status: DC
  Administered 2012-01-29: 14:00:00 via INTRAVENOUS
  Filled 2012-01-29 (×2): qty 150

## 2012-01-29 MED ORDER — SODIUM CHLORIDE 0.9 % IV SOLN
0.8000 ug/kg/min | INTRAVENOUS | Status: DC
  Administered 2012-01-29: 1 ug/kg/min via INTRAVENOUS
  Administered 2012-01-30: 0.795 ug/kg/min via INTRAVENOUS
  Administered 2012-01-31: 0.804 ug/kg/min via INTRAVENOUS
  Filled 2012-01-29 (×3): qty 100

## 2012-01-29 MED ORDER — GLUCERNA PO LIQD
237.0000 mL | Freq: Three times a day (TID) | ORAL | Status: DC
  Filled 2012-01-29 (×3): qty 237

## 2012-01-29 MED ORDER — INSULIN ASPART 100 UNIT/ML ~~LOC~~ SOLN
0.0000 [IU] | Freq: Every day | SUBCUTANEOUS | Status: DC
  Administered 2012-01-29: 3 [IU] via SUBCUTANEOUS

## 2012-01-29 MED ORDER — HEPARIN SODIUM (PORCINE) 5000 UNIT/ML IJ SOLN: 5000.0000 [IU] | Freq: Three times a day (TID) | INTRAMUSCULAR | Status: DC

## 2012-01-29 MED ORDER — SODIUM CHLORIDE 0.9 % IV SOLN
INTRAVENOUS | Status: AC
  Administered 2012-01-29: 2.3 [IU]/h via INTRAVENOUS
  Filled 2012-01-29 (×2): qty 1

## 2012-01-29 MED ORDER — ARTIFICIAL TEARS OP OINT
1.0000 "application " | TOPICAL_OINTMENT | Freq: Three times a day (TID) | OPHTHALMIC | Status: DC
  Administered 2012-01-29 – 2012-02-02 (×13): 1 via OPHTHALMIC
  Filled 2012-01-29: qty 3.5

## 2012-01-29 NOTE — Progress Notes (Signed)
  Echocardiogram 2D Echocardiogram has been performed.  [name] 01/29/2012, 10:07 AM

## 2012-01-29 NOTE — Progress Notes (Signed)
1800  RN Daily Progress Note  Neuro: -Patient on versed for sedation and fentanyl for analgesia.  -BIS monitor placed at 14:00 due to CCM order for paralytic for optimum ARDS treatment. Versed and Fentanyl gtt titrated up (see I/O) to a BIS goal <40. Paralytic gtt started only after optimum sedation/analgesia (BIS 39 at 1500) -Eyes lubricated with lacrilube and covered with gauze over eyes.   CV -Patient Arterial line very positional and dampens frequently. Return of waveform with flush.  RT Mabe asked to assess. -Levophed gtt titrated for MAP goal maintain at >60 throughout shift -CVP 8-10 throughout shift  Respiratory -Vent changes made by RT. Increased rate 26, decreased TV 382, FIO2 50 and Peep 10 based ARDS protocol -Bicarb gtt started based on ABG  GI -Insulin gtt off -Lantus started and CBG now Q4H  GU -Decreased UO. Dr Tyson Alias aware. Note CVP 8-10 throughout shift. -No lasix ordered despite SAILS study  Skin -JP drain dressing changed in AM  Report given to [employer]

## 2012-01-29 NOTE — Care Management Note (Signed)
    Page 1 of 2   02/08/2012     3:25:06 PM   CARE MANAGEMENT NOTE 02/08/2012  Patient:  [name], [name]   Account Number:  [id number]  Date Initiated:  01/29/2012  Documentation initiated by:  [facility]  Subjective/Objective Assessment:   Septic/ARDS post lap chole on 7-1.  Tx to [facility] 01-28-12.     Action/Plan:   pt rec hhpt- patient lives in [place].   Anticipated DC Date:  02/06/2012   Anticipated DC Plan:  HOME/SELF CARE      DC Planning Services  CM consult      Choice offered to / List presented to:          [facility] arranged  HH - 11 Patient Refused      Status of service:  Completed, signed off Medicare Important Message given?   (If response is "NO", the following Medicare IM given date fields will be blank) Date Medicare IM given:   Date Additional Medicare IM given:    Discharge Disposition:  HOME/SELF CARE  Per UR Regulation:  Reviewed for med. necessity/level of care/duration of stay  If discussed at Long Length of Stay Meetings, dates discussed:    Comments:  Contact:  [place],Shirley sister -  [phone]                 Wonda Cerise - Aunt -  [phone]  02/08/12 15:23 Letha Cape RN, BSN [phone] patient dc on 7/13, spoke with patient today to see if she wanted hhpt , she states she has bee ambulating with her sister and she is doing well. She does not have any insurance and lives in [place] so she does not have funds to pay for hhpt, at this time she states she does not need it.  02/05/12 17:14 Letha Cape RN, BSN [phone] patient lives in [place], physical therapy recs hhpt, patient for possible dc on Monday.  02-03-12 11:45am Avie Arenas, RNBSN [phone] Called and left message for Talbert Forest - awaiting call back. 2pm Talbert Forest called back - relationship is older sister.  Is on disability and is home 24/7 and is planning on when her sister comes home taking care of her at her house.  Updated contacts with relationship to  patient.  Has lived with boyfriend prior to admission at her house, but is now in jail and will not be returning there on discharge.

## 2012-01-29 NOTE — Research (Signed)
Discussed SAILS clinical research trial, Crestor vs Placebo in patients with ARDS and septi,c with patient's sister and brother.  All questions and concerns were addressed and answered prior to obtaining informed consent.  No study procedures were initiated prior to obtaining informed consent.  A copy of the signed consent was provided to the L[facility].  Patient is currentlty pressor dependent  and therefore no fluid management with lasix will commence until off pressors for 12 hours and no fluid bolus given in past 12 hours.  Please contact CCM physician or study coordinator at cell# [phone] or home [phone] for any questions or concerns.

## 2012-01-29 NOTE — Progress Notes (Signed)
[facility] Progress Note    Subjective: Patient still septic.  Sedated, not paralyzed.  On ventilator, Still ARDS  Objective: Vital signs in last 24 hours: Temp:  [98.1 F (36.7 C)-100.6 F (38.1 C)] 99.2 F (37.3 C) (07/05 0442) Pulse Rate:  [62-89] 74  (07/05 0700) Resp:  [1-26] 18  (07/05 0832) BP: (88-124)/(39-57) 98/43 mmHg (07/05 0832) SpO2:  [91 %-99 %] 99 % (07/05 0832) FiO2 (%):  [60 %-60.2 %] 60 % (07/05 0832) Weight:  [103.7 kg (228 lb 9.9 oz)] 103.7 kg (228 lb 9.9 oz) (07/05 0424) Last BM Date:  (Prior to admission)  Intake/Output from previous day: 07/04 0701 - 07/05 0700 In: 2890 [I.V.:1510; NG/GT:280; IV Piggyback:1100] Out: 1970 [Urine:1870; Emesis/NG output:100] Intake/Output this shift:    General: Sedated and on pressors  Lungs: Clear  Abd: Hypoactive bowel sounds., wounds okay.  Murky fluid from drain.  Extremities: Edematous  Neuro: Sedated  Lab Results:  @LABLAST2 (wbc:2,hgb:2,hct:2,plt:2) BMET  Basename 01/29/12 0500 01/28/12 0644  NA 136 135  K 3.9 4.7  CL 104 102  CO2 21 17*  GLUCOSE 294* 272*  BUN 23 15  CREATININE 0.74 0.63  CALCIUM 8.6 8.2*   PT/INR  Basename 01/28/12 1024  LABPROT 15.8*  INR 1.23   ABG  Basename 01/29/12 0405 01/28/12 0646  PHART 7.250* 7.295*  HCO3 20.2 16.8*    Studies/Results: Ct Abdomen Pelvis W Contrast  01/28/2012  *RADIOLOGY REPORT*  Clinical Data: Status post appendectomy for perforated appendix 01/25/2012.  Drain in place.  Respiratory distress and sepsis. Question abscess.  CT ABDOMEN AND PELVIS WITH CONTRAST  Technique:  Multidetector CT imaging of the abdomen and pelvis was performed following the standard protocol during bolus administration of intravenous contrast.  Contrast: OMNIPAQUE IOHEXOL 300 MG/ML  SOLN  Comparison: CT abdomen and pelvis 01/27/2012.  Findings: Small bilateral pleural effusions are again seen. Extensive bilateral airspace disease in the lung bases persists.  Surgical  drain remains in place in the right lower quadrant.  The drain does not localize a fluid collection.  There is a persistent fluid collection in the cul-de-sac measuring 4.6 cm transverse by 2.7 cm AP by 3.7 cm cranial-caudal compared to 5.0 cm transverse by 3.4 cm AP by 4.4 cm cranial-caudal on the prior study.  No new fluid collection is identified.  The patient is status post cholecystectomy.  The liver, spleen, adrenal glands, pancreas and kidneys are unremarkable.  Uterus and adnexa are unremarkable.  Foley catheter is in place in a decompressed urinary bladder.  The stomach and small and large bowel appear normal.  There is no focal bony abnormality.  IMPRESSION:  1.  Slight decrease in the size of a small fluid collection in the cul-de-sac of the pelvis. 2.  Surgical drain remains in place in the right lower quadrant. The drain does not localize a fluid collection. 3.  No marked change in extensive bibasilar airspace disease and small bilateral pleural effusions.  Original Report Authenticated By: Bernadene Bell. Maricela Curet, M.D.   Portable Chest Xray In Am  01/29/2012  *RADIOLOGY REPORT*  Clinical Data: Check endotracheal tube.  Central venous line.  PORTABLE CHEST - 1 VIEW  Comparison: 01/28/2012.  Findings: Endotracheal tube and right IJ line are stable.  An NG tube courses off the inferior border the film.  The heart is mildly enlarged. A diffuse interstitial pattern is compatible with edema. Mild bibasilar atelectasis is stable.  IMPRESSION:  1.  Stable diffuse interstitial edema. 2.  The support apparatus  is stable.  Original Report Authenticated By: Jamesetta Orleans. MATTERN, M.D.   Dg Chest Port 1 View  01/28/2012  *RADIOLOGY REPORT*  Clinical Data: Central line placement  PORTABLE CHEST - 1 VIEW  Comparison: 01/28/2012  Findings: Right IJ catheter tip projects over the proximal SVC / brachiocephalic confluence.  Endotracheal tube tip projects 3.4 cm proximal to the carina.  NG tube descends below the level  of the image.  Bilateral airspace opacities are similar to slightly improved in the interval.  Prominent cardiomediastinal contours are similar.  No pneumothorax.  No acute osseous finding.  IMPRESSION: Right IJ catheter tip projects over the brachiocephalic confluence / proximal SVC.  No pneumothorax.  Endotracheal tube tip projects 3.4 cm proximal to the carina.  Bilateral airspace opacities are similar to slightly improved in the interval.  Original Report Authenticated By: Waneta Martins, M.D.    Anti-infectives: Anti-infectives     Start     Dose/Rate Route Frequency Ordered Stop   01/28/12 1200   vancomycin (VANCOCIN) IVPB 1000 mg/200 mL premix        1,000 mg 200 mL/hr over 60 Minutes Intravenous Every 8 hours 01/28/12 0739     01/28/12 0900   imipenem-cilastatin (PRIMAXIN) 500 mg in sodium chloride 0.9 % 100 mL IVPB        500 mg 200 mL/hr over 30 Minutes Intravenous Every 6 hours 01/28/12 0739            Assessment/Plan: s/p  Continue ABX therapy due to Post-op infection Continue foley due to strict I&O, patient critically ill and patient in ICU Follow loosely from surgical standpoint.  LOS: 1 day   Marta Lamas. Gae Bon, MD, FACS [phone] [facility] 01/29/2012

## 2012-01-29 NOTE — H&P (Signed)
Name: [name] MRN: [id number] DOB: 07/24/1971    LOS: 1  Referring Provider:  [facility] Reason for Referral:  Acute respiratory failure  PULMONARY / CRITICAL CARE MEDICINE  Brief patient description:  41 y/o female with DM2, who developed ARDS on 7/3 after a lap appy on 7/1 at [facility].  She was transferred to [facility] on 01/28/2012.  Events Since Admission: 7/3 CT Ab>> bibasilar airspace disease, fluid near appy site and drainage catheter, unremarkable small bowel, stomach, colon.   7/4 L Radial Art Line>>  Current Status:  vented  Vital Signs: Temp:  [98.1 F (36.7 C)-100.6 F (38.1 C)] 98.2 F (36.8 C) (07/05 1145) Pulse Rate:  [72-89] 72  (07/05 1000) Resp:  [10-26] 16  (07/05 1000) BP: (88-124)/(39-57) 93/41 mmHg (07/05 1000) SpO2:  [94 %-99 %] 95 % (07/05 1000) FiO2 (%):  [60 %-60.1 %] 60 % (07/05 1100) Weight:  [228 lb 9.9 oz (103.7 kg)] 228 lb 9.9 oz (103.7 kg) (07/05 0424)  Physical Examination: Gen: sedated on vent PULM: course breath sounds w/ predominantly upper airway transmision. On Vent CV: RRR, no mgr, no JVD AB: BS infrequent, soft,  Ext: warm, no edema, no clubbing, no cyanosis Derm: no rash or skin breakdown Neuro: Sedated   Principal Problem:  *Acute respiratory failure with hypoxia Active Problems:  Appendicitis  ARDS (adult respiratory distress syndrome)  Diabetes mellitus  Asthma  Hypertension   ASSESSMENT AND PLAN  PULMONARY  Lab 01/29/12 0405 01/28/12 0646  PHART 7.250* 7.295*  PCO2ART 48.1* 34.6*  PO2ART 82.8 84.0  HCO3 20.2 16.8*  O2SAT 94.6 95.0   Ventilator Settings: Vent Mode:  [-] PRVC FiO2 (%):  [60 %-60.1 %] 60 % Set Rate:  [18 bmp] 18 bmp Vt Set:  [450 mL] 450 mL PEEP:  [10 cmH20] 10 cmH20 Plateau Pressure:  [24 cmH20] 24 cmH20 CXR:  1. Stable diffuse interstitial edema.  2. The support apparatus is stable. ETT:  7/3 >>  A:  ARDS likely due to HCAP vs. Sepsis from appendicitis; Asthma without  apparent exacerbation. CXR shows diffuse interstitial edema but overall improved.  P:   - ARDS protocol maintain -goal CVP < 4 -cont sedation protocol -see ID for antibiotics -Change bronchodilators to PRN, limit in ARDS -last abg reviewed, plat 24, at 7 cc/kg, goal to 6, increase rate to max 26 with h/o asthma, abg to follow -pa/fio2 ratio = severe , consider 48 hrs paralysis -pcxr i nam  -NO WUA likely able in am or fruther Bicarb drip for ph less 7.25, ards Hold lasix today  CARDIOVASCULAR  Lab 01/28/12 1322 01/28/12 0647 01/28/12 0644  TROPONINI <0.30 -- <0.30  LATICACIDVEN -- 0.7 --  PROBNP -- -- 1340.0*   ECG:  pending Lines: 7/4 R IJ CVL >>  A: Requiring pressors. Currently on Levo 5. Hypotensive since admission. Fluid status +1.27L since admission. Non-tachy.  P:  - Likely still fluid deficit will provide 1/2NS  - Wean Levo as tolerated to keep MAP >65 - Contiue to hold home lisinopril/HCTZ, dc in setting arf - Keep CVP < 10 for ARDS Echo awaited  RENAL  Lab 01/29/12 0500 01/28/12 0644  NA 136 135  K 3.9 4.7  CL 104 102  CO2 21 17*  BUN 23 15  CREATININE 0.74 0.63  CALCIUM 8.6 8.2*  MG 2.0 --  PHOS 3.3 --   Intake/Output      07/04 0701 - 07/05 0700 07/05 0701 - 07/06 0700  I.V. (mL/kg) 1502.3 (14.5) 260.6 (2.5)   Other  20   NG/GT 280 80   IV Piggyback 1100 150   Total Intake(mL/kg) 2882.3 (27.8) 510.6 (4.9)   Urine (mL/kg/hr) 1900 (0.8) 35 (0.1)   Emesis/NG output 100    Total Output 2000 35   Net +882.3 +475.6         Foley:  7/4  A:  No acute issues. Cr. 0.74 on admission. UOP of 1.9L in 24hrs. IV lasix 40 x1 before transfer P:   -BMET - ARDS noted, cvp 10 , may need lasix restart in future, follow pressor needs -follow uop  GASTROINTESTINAL  Lab 01/29/12 0500 01/28/12 0644  AST 18 19  ALT 10 12  ALKPHOS 91 112  BILITOT 0.1* 0.1*  PROT 5.7* 5.2*  ALBUMIN 2.0* 1.8*    A:  S/p Appy, protein calorie malnutrition.  Minimal  serosaguanous JP drain output of  P:   -continue tube feedings -follow JP drain output -see ID for ABX -check CMP  HEMATOLOGIC  Lab 01/29/12 0500 01/28/12 1024  HGB 9.3* 8.9*  HCT 29.9* 27.6*  PLT 405* 300  INR -- 1.23  APTT -- --   A:  No acute issues. Likely volume depleted despite fluid status being positive since admission. Anemic, but unsure of baseline. Stable today at 9.3.  P:  -follow cbc - hep sub q  INFECTIOUS  Lab 01/29/12 0500 01/28/12 1024 01/28/12 0644  WBC 20.2* 11.2* --  PROCALCITON -- -- 1.10   Cultures: 7/5 resp >> 7/2 blood > > Antibiotics: 7/2 Vanc HCAP >> 7/2 Imipenim (HCAP/Appendicitis) >>  A:  HCAP and appendicitis. WBC elevated today but afebrile. Clear cut hemoconcetration, Cx negative so far.  P:   - CBC in am -vanc/imi -follow blood and resp cultures -follow JP drain output Low threshold antifungal, add myco with clinical deterioration Ct reviewed, appreciate surgery  ENDOCRINE  Lab 01/29/12 1012 01/29/12 0647 01/29/12 0534 01/29/12 0430 01/29/12 0326  GLUCAP 128* 189* 260* 272* 291*   A:  DM2. On glucose stabilizer P:   - Transition off glucose stabilizer to moderate SSI and Lantus as sugars appropriate for change.  - Continue to hold home Metformin -goal off drip  NEUROLOGIC  A:  Fentanyl 222mcg/hr and versed 1mg /hr P:   -rass goal to -3, requires vsred, fent -add vec, see pulm   BEST PRACTICE / DISPOSITION Level of Care:  ICU Primary Service:  PCCM Consultants: none  Code Status:  full Diet:  Enteral feedings DVT Px:  Sub q hep GI Px:  pepcid Skin Integrity:  normal Social / Family:  Family updated and w/ pt on 7/5  Ccm time 35 min  Shelly Flatten, MD Family Medicine PGY-2 01/29/2012, 12:05 PM  I have fully examined this patient and agree with above findings.    And edited in full.  Mcarthur Rossetti. Tyson Alias, MD, FACP Pgr: [phone] [facility]

## 2012-01-30 ENCOUNTER — Inpatient Hospital Stay (HOSPITAL_COMMUNITY): Payer: Medicaid - Out of State

## 2012-01-30 LAB — POCT I-STAT 3, ART BLOOD GAS (G3+)
O2 Saturation: 91 %
Patient temperature: 99
TCO2: 29 mmol/L (ref 0–100)
pO2, Arterial: 70 mmHg — ABNORMAL LOW (ref 80.0–100.0)

## 2012-01-30 LAB — GLUCOSE, CAPILLARY
Glucose-Capillary: 133 mg/dL — ABNORMAL HIGH (ref 70–99)
Glucose-Capillary: 198 mg/dL — ABNORMAL HIGH (ref 70–99)
Glucose-Capillary: 312 mg/dL — ABNORMAL HIGH (ref 70–99)
Glucose-Capillary: 357 mg/dL — ABNORMAL HIGH (ref 70–99)

## 2012-01-30 LAB — CK: Total CK: 11 U/L (ref 7–177)

## 2012-01-30 LAB — BLOOD GAS, ARTERIAL
Acid-base deficit: 1.4 mmol/L (ref 0.0–2.0)
FIO2: 0.5 %
MECHVT: 380 mL
TCO2: 27.7 mmol/L (ref 0–100)
pCO2 arterial: 69.3 mmHg (ref 35.0–45.0)
pO2, Arterial: 82.6 mmHg (ref 80.0–100.0)

## 2012-01-30 LAB — BASIC METABOLIC PANEL
BUN: 23 mg/dL (ref 6–23)
CO2: 26 mEq/L (ref 19–32)
Calcium: 8.4 mg/dL (ref 8.4–10.5)
GFR calc non Af Amer: >90 mL/min (ref >90)
Glucose, Bld: 195 mg/dL — ABNORMAL HIGH (ref 70–99)

## 2012-01-30 LAB — CREATININE, SERUM
Creatinine, Ser: 0.73 mg/dL (ref 0.50–1.10)
GFR calc non Af Amer: >90 mL/min (ref >90)

## 2012-01-30 MED ORDER — METHYLPREDNISOLONE SODIUM SUCC 40 MG IJ SOLR
40.0000 mg | Freq: Two times a day (BID) | INTRAMUSCULAR | Status: DC
  Administered 2012-01-30 – 2012-01-31 (×3): 40 mg via INTRAVENOUS
  Filled 2012-01-30 (×4): qty 1

## 2012-01-30 MED ORDER — INSULIN ASPART 100 UNIT/ML ~~LOC~~ SOLN
0.0000 [IU] | SUBCUTANEOUS | Status: DC
  Administered 2012-01-30: 9 [IU] via SUBCUTANEOUS
  Administered 2012-01-30: 1 [IU] via SUBCUTANEOUS
  Administered 2012-01-30: 2 [IU] via SUBCUTANEOUS
  Administered 2012-01-30: 9 [IU] via SUBCUTANEOUS
  Administered 2012-01-31: 3 [IU] via SUBCUTANEOUS
  Administered 2012-01-31: 9 [IU] via SUBCUTANEOUS
  Administered 2012-01-31: 7 [IU] via SUBCUTANEOUS
  Administered 2012-01-31 (×2): 5 [IU] via SUBCUTANEOUS
  Administered 2012-01-31: 3 [IU] via SUBCUTANEOUS
  Administered 2012-02-01 (×2): 1 [IU] via SUBCUTANEOUS
  Administered 2012-02-01 (×3): 2 [IU] via SUBCUTANEOUS
  Administered 2012-02-02: 3 [IU] via SUBCUTANEOUS
  Administered 2012-02-02 (×3): 2 [IU] via SUBCUTANEOUS
  Administered 2012-02-02: 3 [IU] via SUBCUTANEOUS
  Administered 2012-02-03 (×2): 2 [IU] via SUBCUTANEOUS
  Administered 2012-02-03: 3 [IU] via SUBCUTANEOUS
  Administered 2012-02-03: 5 [IU] via SUBCUTANEOUS
  Administered 2012-02-03: 2 [IU] via SUBCUTANEOUS
  Administered 2012-02-03: 3 [IU] via SUBCUTANEOUS
  Administered 2012-02-04 (×2): 2 [IU] via SUBCUTANEOUS
  Administered 2012-02-04: 1 [IU] via SUBCUTANEOUS
  Administered 2012-02-04: 2 [IU] via SUBCUTANEOUS
  Administered 2012-02-04: 1 [IU] via SUBCUTANEOUS
  Administered 2012-02-04: 2 [IU] via SUBCUTANEOUS
  Administered 2012-02-05 (×3): 1 [IU] via SUBCUTANEOUS

## 2012-01-30 MED ORDER — SODIUM BICARBONATE 8.4 % IV SOLN: INTRAVENOUS | Status: DC

## 2012-01-30 MED ORDER — VANCOMYCIN HCL IN DEXTROSE 1-5 GM/200ML-% IV SOLN
1000.0000 mg | Freq: Two times a day (BID) | INTRAVENOUS | Status: DC
  Administered 2012-01-31 – 2012-02-02 (×5): 1000 mg via INTRAVENOUS
  Filled 2012-01-30 (×6): qty 200

## 2012-01-30 MED ORDER — INSULIN ASPART 100 UNIT/ML ~~LOC~~ SOLN: 0.0000 [IU] | SUBCUTANEOUS | Status: DC

## 2012-01-30 NOTE — Progress Notes (Signed)
ANTIBIOTIC CONSULT NOTE - INITIAL  Pharmacy Consult for Vancomycin Indication: rule out sepsis  No Known Allergies  Patient Measurements: Height: 5\' 8"  (172.7 cm) Weight: 230 lb 2.6 oz (104.4 kg) IBW/kg (Calculated) : 63.9  Adjusted Body Weight: 80  Vital Signs: Temp: 99 F (37.2 C) (07/06 1800) Temp src: Rectal (07/06 1800) BP: 139/71 mmHg (07/06 1845) Pulse Rate: 114  (07/06 1845) Intake/Output from previous day: 07/05 0701 - 07/06 0700 In: 4556.9 [I.V.:2926.9; NG/GT:460; IV Piggyback:1150] Out: 1570 [Urine:1570] Intake/Output from this shift:    Labs (at [facility]): SCr 0.71   Basename 01/30/12 1035 01/30/12 1030 01/29/12 1335 01/29/12 0500 01/28/12 1024  WBC -- -- 14.2* 20.2* 11.2*  HGB -- -- 8.7* 9.3* 8.9*  PLT -- -- 368 405* 300  LABCREA -- -- -- -- --  CREATININE 0.73 0.59 0.77 -- --   Estimated Creatinine Clearance: 115.8 ml/min (by C-G formula based on Cr of 0.73).  Basename 01/30/12 1850  VANCOTROUGH 23.7*  VANCOPEAK --  VANCORANDOM --  GENTTROUGH --  GENTPEAK --  GENTRANDOM --  TOBRATROUGH --  TOBRAPEAK --  TOBRARND --  AMIKACINPEAK --  AMIKACINTROU --  AMIKACIN --     Microbiology: Recent Results (from the past 720 hour(s))  MRSA PCR SCREENING     Status: Normal   Collection Time   01/28/12  5:36 AM      Component Value Range Status Comment   MRSA by PCR NEGATIVE  NEGATIVE Final   CULTURE, BLOOD (ROUTINE X 2)     Status: Normal (Preliminary result)   Collection Time   01/28/12  8:30 AM      Component Value Range Status Comment   Specimen Description BLOOD LEFT ARM   Final    Special Requests BOTTLES DRAWN AEROBIC AND ANAEROBIC 10CC   Final    Culture  Setup Time 01/28/2012 16:55   Final    Culture     Final    Value:        BLOOD CULTURE RECEIVED NO GROWTH TO DATE CULTURE WILL BE HELD FOR 5 DAYS BEFORE ISSUING A FINAL NEGATIVE REPORT   Report Status PENDING   Incomplete   CULTURE, BLOOD (ROUTINE X 2)     Status: Normal  (Preliminary result)   Collection Time   01/28/12  8:45 AM      Component Value Range Status Comment   Specimen Description BLOOD LEFT ARM   Final    Special Requests BOTTLES DRAWN AEROBIC AND ANAEROBIC 10CC   Final    Culture  Setup Time 01/28/2012 16:55   Final    Culture     Final    Value:        BLOOD CULTURE RECEIVED NO GROWTH TO DATE CULTURE WILL BE HELD FOR 5 DAYS BEFORE ISSUING A FINAL NEGATIVE REPORT   Report Status PENDING   Incomplete   CULTURE, RESPIRATORY     Status: Normal (Preliminary result)   Collection Time   01/29/12 11:09 AM      Component Value Range Status Comment   Specimen Description TRACHEAL ASPIRATE   Final    Special Requests NONE   Final    Gram Stain     Final    Value: FEW WBC PRESENT,BOTH PMN AND MONONUCLEAR     FEW SQUAMOUS EPITHELIAL CELLS PRESENT     NO ORGANISMS SEEN   Culture NO GROWTH   Final    Report Status PENDING   Incomplete     Medical History:  Past Medical History  Diagnosis Date  . Peripheral vascular disease   . Hypertension   . Anginal pain   . Anxiety   . Depression   . Asthma   . COPD (chronic obstructive pulmonary disease)   . Shortness of breath   . Tuberculosis   . GERD (gastroesophageal reflux disease)   . Headache   . Arthritis   . Diabetes mellitus 01/28/2012    Home Medications:  Metformin  Zantac  Lortab  Zestoretic  ASA  Coreg  Gabapentin  Assessment: 41 yo female with ARDS/sepsis for empiric antibiotics. Trough came back supratherapeutic tonight. Scr has remained normal. UOP has been good.   Goal of Therapy:  Vancomycin trough level 15-20 mcg/ml  Plan:  Change vanc to 1g IV q12  [name] 01/30/2012,7:44 PM

## 2012-01-30 NOTE — Progress Notes (Signed)
[facility] Progress Note    Subjective: Patient is sedated and paralyzed.  Still on Levophed  Objective: Vital signs in last 24 hours: Temp:  [98.2 F (36.8 C)-100.6 F (38.1 C)] 98.6 F (37 C) (07/06 0740) Pulse Rate:  [72-115] 95  (07/06 0727) Resp:  [16-30] 30  (07/06 0727) BP: (92-128)/(37-60) 124/60 mmHg (07/06 0727) SpO2:  [90 %-99 %] 96 % (07/06 0727) FiO2 (%):  [49.9 %-60 %] 50.2 % (07/06 0727) Weight:  [104.4 kg (230 lb 2.6 oz)] 104.4 kg (230 lb 2.6 oz) (07/06 0500) Last BM Date:  (Prior to admission)  Intake/Output from previous day: 07/05 0701 - 07/06 0700 In: 4338 [I.V.:2728; NG/GT:440; IV Piggyback:1150] Out: 1570 [Urine:1570] Intake/Output this shift:    General: Paralyzed and not overbreathing  Lungs: Coarse breath sounds bilaterally with coarse rhonchi.  CXR show diffuse and focal infiltrates.  I would lean towards prone ventilation.  Abd: Soft, no enteric drainage from drain in RLQ.  Drainage is serous.  Extremities: No changes  Neuro: Sedated and paralyzed.  Lab Results:  @LABLAST2 (wbc:2,hgb:2,hct:2,plt:2) BMET  Basename 01/29/12 1335 01/29/12 0500 01/28/12 0644  NA -- 136 135  K -- 3.9 4.7  CL -- 104 102  CO2 -- 21 17*  GLUCOSE -- 294* 272*  BUN -- 23 15  CREATININE 0.77 0.74 --  CALCIUM -- 8.6 8.2*   PT/INR  Basename 01/28/12 1024  LABPROT 15.8*  INR 1.23   ABG  Basename 01/30/12 0525 01/29/12 1459  PHART 7.198* 7.237*  HCO3 25.7* 19.0*    Studies/Results: Ct Abdomen Pelvis W Contrast  01/28/2012  *RADIOLOGY REPORT*  Clinical Data: Status post appendectomy for perforated appendix 01/25/2012.  Drain in place.  Respiratory distress and sepsis. Question abscess.  CT ABDOMEN AND PELVIS WITH CONTRAST  Technique:  Multidetector CT imaging of the abdomen and pelvis was performed following the standard protocol during bolus administration of intravenous contrast.  Contrast: OMNIPAQUE IOHEXOL 300 MG/ML  SOLN  Comparison: CT abdomen and  pelvis 01/27/2012.  Findings: Small bilateral pleural effusions are again seen. Extensive bilateral airspace disease in the lung bases persists.  Surgical drain remains in place in the right lower quadrant.  The drain does not localize a fluid collection.  There is a persistent fluid collection in the cul-de-sac measuring 4.6 cm transverse by 2.7 cm AP by 3.7 cm cranial-caudal compared to 5.0 cm transverse by 3.4 cm AP by 4.4 cm cranial-caudal on the prior study.  No new fluid collection is identified.  The patient is status post cholecystectomy.  The liver, spleen, adrenal glands, pancreas and kidneys are unremarkable.  Uterus and adnexa are unremarkable.  Foley catheter is in place in a decompressed urinary bladder.  The stomach and small and large bowel appear normal.  There is no focal bony abnormality.  IMPRESSION:  1.  Slight decrease in the size of a small fluid collection in the cul-de-sac of the pelvis. 2.  Surgical drain remains in place in the right lower quadrant. The drain does not localize a fluid collection. 3.  No marked change in extensive bibasilar airspace disease and small bilateral pleural effusions.  Original Report Authenticated By: Bernadene Bell. Maricela Curet, M.D.   Dg Chest Port 1 View  01/30/2012  *RADIOLOGY REPORT*  Clinical Data: ARDS  PORTABLE CHEST - 1 VIEW  Comparison: 01/29/2012  Findings: There is a right IJ catheter with tip in the SVC.  The ET tube tip remains above the carina.  There is a nasogastric tube coiled in  the stomach.  The heart size appears mildly enlarged. Bilateral interstitial and airspace opacities are noted and appear increased from previous exam.  IMPRESSION:  1.  Worsening aeration to both lungs.  Original Report Authenticated By: Rosealee Albee, M.D.   Portable Chest Xray In Am  01/29/2012  *RADIOLOGY REPORT*  Clinical Data: Check endotracheal tube.  Central venous line.  PORTABLE CHEST - 1 VIEW  Comparison: 01/28/2012.  Findings: Endotracheal tube and right IJ  line are stable.  An NG tube courses off the inferior border the film.  The heart is mildly enlarged. A diffuse interstitial pattern is compatible with edema. Mild bibasilar atelectasis is stable.  IMPRESSION:  1.  Stable diffuse interstitial edema. 2.  The support apparatus is stable.  Original Report Authenticated By: Jamesetta Orleans. MATTERN, M.D.    Anti-infectives: Anti-infectives     Start     Dose/Rate Route Frequency Ordered Stop   01/29/12 1400   micafungin (MYCAMINE) 100 mg in sodium chloride 0.9 % 100 mL IVPB        100 mg 100 mL/hr over 1 Hours Intravenous Daily 01/29/12 1338     01/28/12 1200   vancomycin (VANCOCIN) IVPB 1000 mg/200 mL premix        1,000 mg 200 mL/hr over 60 Minutes Intravenous Every 8 hours 01/28/12 0739     01/28/12 0900   imipenem-cilastatin (PRIMAXIN) 500 mg in sodium chloride 0.9 % 100 mL IVPB        500 mg 200 mL/hr over 30 Minutes Intravenous Every 6 hours 01/28/12 0739            Assessment/Plan: s/p  Per CCM service Minimal surgical input at this time. If the patient subsequently needs nutrition, enteric feedings will be okay once patient off paralytic.  LOS: 2 days   Marta Lamas. Gae Bon, MD, FACS [phone] [facility] 01/30/2012

## 2012-01-30 NOTE — Progress Notes (Signed)
Name: [name] MRN: [id number] DOB: 08-04-1970    LOS: 2  Referring Provider:  [facility] Reason for Referral:  Acute respiratory failure  PULMONARY / CRITICAL CARE MEDICINE  Brief patient description:  41 y/o female with DM2, who developed ARDS on 7/3 after a lap appy on 7/1 at [facility].  She was transferred to [facility] on 01/28/2012.  Events Since Admission: 7/3 CT Ab>> bibasilar airspace disease, fluid near appy site and drainage catheter, unremarkable small bowel, stomach, colon.   7/4 L Radial Art Line>>7/5  Current Status:  Vented sedated and paralyzed  Vital Signs: Temp:  [98.2 F (36.8 C)-100.6 F (38.1 C)] 98.6 F (37 C) (07/06 0740) Pulse Rate:  [72-115] 95  (07/06 0727) Resp:  [16-30] 30  (07/06 0727) BP: (92-128)/(37-60) 124/60 mmHg (07/06 0727) SpO2:  [90 %-98 %] 96 % (07/06 0727) FiO2 (%):  [49.9 %-60 %] 50.2 % (07/06 0727) Weight:  [230 lb 2.6 oz (104.4 kg)] 230 lb 2.6 oz (104.4 kg) (07/06 0500)  Physical Examination: Gen: sedated/paralized on vent PULM: course breath sounds and wheezing bilat w/ predominantly upper airway congestion. On Vent CV: RRR, no mgr, no JVD AB: BS infrequent, soft,  Ext: warm, no edema, no clubbing, no cyanosis Derm: no rash or skin breakdown Neuro: Sedated and paralyzed  Principal Problem:  *Acute respiratory failure with hypoxia Active Problems:  Appendicitis  ARDS (adult respiratory distress syndrome)  Diabetes mellitus  Asthma  Hypertension   ASSESSMENT AND PLAN  PULMONARY  Lab 01/30/12 0525 01/29/12 1459 01/29/12 0405 01/28/12 0646  PHART 7.198* 7.237* 7.250* 7.295*  PCO2ART 69.3* 44.5 48.1* 34.6*  PO2ART 82.6 110.0* 82.8 84.0  HCO3 25.7* 19.0* 20.2 16.8*  O2SAT 95.0 97.0 94.6 95.0   Ventilator Settings: Vent Mode:  [-] PRVC FiO2 (%):  [49.9 %-60 %] 50.2 % Set Rate:  [26 bmp-30 bmp] 30 bmp Vt Set:  [380 mL-680 mL] 380 mL PEEP:  [10 cmH20] 10 cmH20 Plateau Pressure:  [20 cmH20-28 cmH20] 28  cmH20  CXR:  1. Worsening aeration to both lungs.  ETT:  7/3 >>  A:  ARDS likely due to HCAP vs. Sepsis from appendicitis;  Asthma without exacerbation but wheezing today.  CXR worse today Paralysis started 7/5 for severe ARDS P:   - ARDS protocol maintain -goal CVP < 4 (when off pressors) -cont sedation protocol and paralytics -see ID for antibiotics -Continue bronchodilators, add solumedrol for wheezing today - RR at max, no air trapping this AM (7/6) but this is a problem for her, monitor closely -pcxr daily   CARDIOVASCULAR  Lab 01/28/12 1322 01/28/12 0647 01/28/12 0644  TROPONINI <0.30 -- <0.30  LATICACIDVEN -- 0.7 --  PROBNP -- -- 1340.0*   ECG:  pending Lines: 7/4 R IJ>>> Echo: 7/5 Normal anatomy and function. EF of 65-70%  A: septic shock, worsened periodically with air trapping  pressors weaning 7/6 P:  - Likely still fluid deficit but concern for worsening ARDs - Wean Levo as tolerated to keep MAP >65 - Contiue to hold home lisinopril/HCTZ, dc in setting arf - Keep CVP < 4 for ARDS when off pressors   RENAL  Lab 01/29/12 1335 01/29/12 0500 01/28/12 0644  NA -- 136 135  K -- 3.9 4.7  CL -- 104 102  CO2 -- 21 17*  BUN -- 23 15  CREATININE 0.77 0.74 0.63  CALCIUM -- 8.6 8.2*  MG -- 2.0 --  PHOS -- 3.3 --   Intake/Output  07/05 0701 - 07/06 0700 07/06 0701 - 07/07 0700   I.V. (mL/kg) 2728 (26.1)    Other 20    NG/GT 440    IV Piggyback 1150    Total Intake(mL/kg) 4338 (41.6)    Urine (mL/kg/hr) 1570 (0.6)    Emesis/NG output     Total Output 1570    Net +2768          Foley:  7/4  A:  No acute issues. Cr. 0.74 on admission. UOP of 1.6L in 24hrs.  P:   -BMET - ARDS noted, cvp 10 , may need lasix restart in future, follow pressor needs -follow uop  GASTROINTESTINAL  Lab 01/30/12 0400 01/29/12 0500 01/28/12 0644  AST 27 18 19   ALT 13 10 12   ALKPHOS -- 91 112  BILITOT -- 0.1* 0.1*  PROT -- 5.7* 5.2*  ALBUMIN -- 2.0* 1.8*    A:   S/p Appy, protein calorie malnutrition.  Minimal serosaguanous JP drain output of  P:   -continue tube feedings -follow JP drain output -see ID for ABX -BMET in am  HEMATOLOGIC  Lab 01/29/12 1335 01/29/12 0500 01/28/12 1024  HGB 8.7* 9.3* 8.9*  HCT 27.4* 29.9* 27.6*  PLT 368 405* 300  INR -- -- 1.23  APTT -- -- --   A:  No acute issues. Likely volume depleted despite fluid status being positive since admission. Anemic, but unsure of baseline. Stable today at 8.7.  P:  -follow cbc - hep sub q  INFECTIOUS  Lab 01/29/12 1335 01/29/12 0500 01/28/12 1024 01/28/12 0644  WBC 14.2* 20.2* 11.2* --  PROCALCITON -- -- -- 1.10   Cultures: 7/5 resp >> 7/2 blood >>  Antibiotics: 7/2 Vanc HCAP >> 7/2 Imipenim (HCAP/Appendicitis) >>  A:  HCAP and appendicitis. WBC improved today but afebrile.  Cx negative so far.  P:   - CBC in am -vanc/imi -follow blood and resp cultures -follow JP drain output add myco with clinical deterioration Ct reviewed, appreciate surgery  ENDOCRINE  Lab 01/30/12 0732 01/30/12 0409 01/30/12 0013 01/29/12 2309 01/29/12 2217  GLUCAP 133* 159* 155* 198* 251*   A:  DM2. Off glucose stabilizer. On SSI P:   - continue moderate SSI and Lantus as sugars appropriate for change.  - Continue to hold home Metformin  NEUROLOGIC  A:  Fentanyl 279mcg/hr and versed 1mg /hr and vecuronium (Vec for 48 hours for paralysis due to severe ARDS) P:   -rass goal to -3, requires vsred, fent, Vec    BEST PRACTICE / DISPOSITION Level of Care:  ICU Primary Service:  PCCM Consultants: none  Code Status:  full Diet:  Enteral feedings DVT Px:  Sub q hep GI Px:  pepcid Skin Integrity:  normal Social / Family:  Family updated and w/ pt on 7/5   Shelly Flatten, MD Family Medicine PGY-2 01/30/2012, 9:11 AM  Attending:   I have seen and examined the patient with nurse practitioner/resident and agree with and have edited the note above.   Yolonda Kida  PCCM Pager: [phone] Cell: [phone] If no response, call [phone]

## 2012-01-30 NOTE — Progress Notes (Signed)
Worsening hypercarbic acidosis; rate increased lightly to 30; repeat ABG in 30 min,

## 2012-01-31 ENCOUNTER — Inpatient Hospital Stay (HOSPITAL_COMMUNITY): Payer: Medicaid - Out of State

## 2012-01-31 LAB — BLOOD GAS, ARTERIAL
Drawn by: 36277
FIO2: 0.5 %
MECHVT: 380 mL
PEEP: 10 cmH2O
RATE: 30 resp/min
pCO2 arterial: 46.6 mmHg — ABNORMAL HIGH (ref 35.0–45.0)
pH, Arterial: 7.399 (ref 7.350–7.450)
pO2, Arterial: 69.9 mmHg — ABNORMAL LOW (ref 80.0–100.0)

## 2012-01-31 LAB — GLUCOSE, CAPILLARY
Glucose-Capillary: 275 mg/dL — ABNORMAL HIGH (ref 70–99)
Glucose-Capillary: 289 mg/dL — ABNORMAL HIGH (ref 70–99)
Glucose-Capillary: 405 mg/dL — ABNORMAL HIGH (ref 70–99)
Glucose-Capillary: 414 mg/dL — ABNORMAL HIGH (ref 70–99)

## 2012-01-31 LAB — CBC
HCT: 29.3 % — ABNORMAL LOW (ref 36.0–46.0)
Hemoglobin: 9.3 g/dL — ABNORMAL LOW (ref 12.0–15.0)
MCH: 31.4 pg (ref 26.0–34.0)
MCHC: 31.7 g/dL (ref 30.0–36.0)
MCV: 99 fL (ref 78.0–100.0)
RBC: 2.96 MIL/uL — ABNORMAL LOW (ref 3.87–5.11)
RDW: 15.3 % (ref 11.5–15.5)

## 2012-01-31 LAB — BASIC METABOLIC PANEL
BUN: 27 mg/dL — ABNORMAL HIGH (ref 6–23)
CO2: 28 mEq/L (ref 19–32)
GFR calc non Af Amer: >90 mL/min (ref >90)
Glucose, Bld: 262 mg/dL — ABNORMAL HIGH (ref 70–99)
Potassium: 3.8 mEq/L (ref 3.5–5.1)

## 2012-01-31 LAB — CULTURE, RESPIRATORY W GRAM STAIN

## 2012-01-31 MED ORDER — INSULIN ASPART 100 UNIT/ML ~~LOC~~ SOLN
12.0000 [IU] | Freq: Once | SUBCUTANEOUS | Status: AC
  Administered 2012-01-31: 12 [IU] via SUBCUTANEOUS

## 2012-01-31 MED ORDER — SODIUM CHLORIDE 0.9 % IV BOLUS (SEPSIS)
250.0000 mL | Freq: Once | INTRAVENOUS | Status: AC
  Administered 2012-01-31: 250 mL via INTRAVENOUS

## 2012-01-31 NOTE — Progress Notes (Signed)
Subjective: Remains intubated, not responsive to commands  Objective: Vital signs in last 24 hours: Temp:  [98.1 F (36.7 C)-100.4 F (38 C)] 98.1 F (36.7 C) (07/07 0735) Pulse Rate:  [77-125] 125  (07/07 0811) Resp:  [20-30] 30  (07/07 0811) BP: (106-147)/(42-76) 120/65 mmHg (07/07 0811) SpO2:  [89 %-97 %] 95 % (07/07 0811) FiO2 (%):  [49.7 %-99 %] 50 % (07/07 0811) Weight:  [222 lb 10.6 oz (101 kg)] 222 lb 10.6 oz (101 kg) (07/07 0500) Last BM Date: 01/31/12  Intake/Output from previous day: 07/06 0701 - 07/07 0700 In: 2540.4 [I.V.:1050.4; NG/GT:790; IV Piggyback:700] Out: 4190 [Urine:3925; Drains:265] Intake/Output this shift:    Incisions well-healed Drain - thin serous drainage Some drainage around the JP drain site  Lab Results:   Basename 01/31/12 0430 01/29/12 1335  WBC 8.9 14.2*  HGB 9.3* 8.7*  HCT 29.3* 27.4*  PLT 404* 368   BMET  Basename 01/31/12 0430 01/30/12 1035 01/30/12 1030  NA 141 -- 137  K 3.8 -- 3.7  CL 104 -- 102  CO2 28 -- 26  GLUCOSE 262* -- 195*  BUN 27* -- 23  CREATININE 0.49* 0.73 --  CALCIUM 9.0 -- 8.4   PT/INR  Basename 01/28/12 1024  LABPROT 15.8*  INR 1.23   ABG  Basename 01/31/12 0419 01/30/12 1133  PHART 7.399 7.308*  HCO3 28.3* 27.1*    Studies/Results: Dg Chest Port 1 View  01/31/2012  *RADIOLOGY REPORT*  Clinical Data: Ventilator dependent respiratory failure.  Follow up pulmonary edema/ARDS and left lower lobe atelectasis/pneumonia.  PORTABLE CHEST - 1 VIEW 01/31/2012 0547 hours:  Comparison: Portable chest x-rays yesterday and dating back to 01/27/2011.  Findings: Endotracheal tube tip in satisfactory position approximately 7 cm above the carina.  Right jugular central venous catheter tip in the SVC.  Nasogastric courses below the diaphragm into the stomach.  Cardiac silhouette enlarged but stable.  Stable dense left lower lobe consolidation with air bronchograms. Resolution of pulmonary edema with pulmonary  venous hypertension persisting.  Stable mild atelectasis at the right lung base.  No new pulmonary parenchymal abnormalities.  IMPRESSION: Support apparatus satisfactory.  Stable dense left lower lobe pneumonia and mild right basilar atelectasis.  Resolution of interstitial pulmonary edema with pulmonary venous hypertension persisting.  No new abnormalities.  Original Report Authenticated By: Arnell Sieving, M.D.   Dg Chest Port 1 View  01/30/2012  *RADIOLOGY REPORT*  Clinical Data: Ventilator dependent respiratory failure.  Follow up edema and atelectasis versus pneumonia.  PORTABLE CHEST - 1 VIEW 01/30/2012 1153 hours:  Comparison: Portable chest x-ray earlier same date 0539 hours and dating back to 01/27/2012.  Findings: Endotracheal tube tip in satisfactory position approximately 5 cm above the carina.  Right jugular central venous catheter tip in the upper SVC.  Nasogastric tube courses below the diaphragm into the stomach.  Cardiac silhouette enlarged but stable.  Interval improvement in the interstitial and airspace pulmonary edema since earlier in the day, with only minimal interstitial edema persisting.  Improved aeration in the lung bases, though moderate consolidation persists, left greater than right.  No new pulmonary parenchymal abnormalities.  IMPRESSION: Support apparatus satisfactory.  Improved pulmonary edema since earlier same date, with only minimal interstitial edema persisting. Improved aeration in the lung bases, with persistent atelectasis and/or pneumonia, left greater than right.  No new abnormalities.  Original Report Authenticated By: Arnell Sieving, M.D.   Dg Chest Port 1 View  01/30/2012  *RADIOLOGY REPORT*  Clinical Data: ARDS  PORTABLE CHEST - 1 VIEW  Comparison: 01/29/2012  Findings: There is a right IJ catheter with tip in the SVC.  The ET tube tip remains above the carina.  There is a nasogastric tube coiled in the stomach.  The heart size appears mildly enlarged.  Bilateral interstitial and airspace opacities are noted and appear increased from previous exam.  IMPRESSION:  1.  Worsening aeration to both lungs.  Original Report Authenticated By: Rosealee Albee, M.D.    Anti-infectives: Anti-infectives     Start     Dose/Rate Route Frequency Ordered Stop   01/31/12 0030   vancomycin (VANCOCIN) IVPB 1000 mg/200 mL premix        1,000 mg 200 mL/hr over 60 Minutes Intravenous Every 12 hours 01/30/12 1947     01/29/12 1400   micafungin (MYCAMINE) 100 mg in sodium chloride 0.9 % 100 mL IVPB        100 mg 100 mL/hr over 1 Hours Intravenous Daily 01/29/12 1338     01/28/12 1200   vancomycin (VANCOCIN) IVPB 1000 mg/200 mL premix  Status:  Discontinued        1,000 mg 200 mL/hr over 60 Minutes Intravenous Every 8 hours 01/28/12 0739 01/30/12 1947   01/28/12 0900   imipenem-cilastatin (PRIMAXIN) 500 mg in sodium chloride 0.9 % 100 mL IVPB        500 mg 200 mL/hr over 30 Minutes Intravenous Every 6 hours 01/28/12 0739            Assessment/Plan: s/p lap appy on 01/25/12 at [facility] Severe ARDS per CCM No apparent surgical complications at this time.   LOS: 3 days    Khaliah Barnick K. 01/31/2012

## 2012-01-31 NOTE — Progress Notes (Signed)
eLink Physician-Brief Progress Note Patient Name: [name] DOB: 03/02/1971 MRN: [id number]  Date of Service  01/31/2012   HPI/Events of Note   Hypotension with CVP of 3 in the setting of SAILs Trial.  Had received lasix earlier.  Lasix held per protocol.  eICU Interventions  Plan: 250 cc bolus of NS for BP support in the setting of low CVP   Intervention Category Intermediate Interventions: Hypotension - evaluation and management  DETERDING,ELIZABETH 01/31/2012, 11:38 PM

## 2012-01-31 NOTE — Progress Notes (Signed)
Name: [name] MRN: [id number] DOB: May 15, 1971    LOS: 3  Referring Provider:  [facility] Reason for Referral:  Acute respiratory failure   PULMONARY / CRITICAL CARE MEDICINE  Brief patient description:  41 y/o female with DM2, who developed ARDS on 7/3 after a lap appy on 7/1 at [facility].  She was transferred to [facility] on 01/28/2012.  Events Since Admission: 7/3 CT Ab>> bibasilar airspace disease, fluid near appy site and drainage catheter, unremarkable small bowel, stomach, colon.   7/4 L Radial Art Line>>7/5  Current Status:  Intubated, sedated, paralyzed. Improved fio2/ peep  Vital Signs: Temp:  [98.1 F (36.7 C)-100.4 F (38 C)] 98.1 F (36.7 C) (07/07 1202) Pulse Rate:  [77-125] 91  (07/07 1100) Resp:  [28-30] 30  (07/07 1100) BP: (106-182)/(42-76) 133/61 mmHg (07/07 1100) SpO2:  [89 %-97 %] 94 % (07/07 1100) FiO2 (%):  [49.7 %-50.5 %] 50.2 % (07/07 1100) Weight:  [222 lb 10.6 oz (101 kg)] 222 lb 10.6 oz (101 kg) (07/07 0500)  Physical Examination: Gen: sedated/paralized on vent PULM: course breath sounds bilaterally CV: RRR, no mgr, no JVD AB: BS infrequent, soft,  Ext: warm, no edema, no clubbing, no cyanosis Derm: no rash or skin breakdown Neuro: Sedated and paralyzed  Principal Problem:  *Acute respiratory failure with hypoxia Active Problems:  Appendicitis  ARDS (adult respiratory distress syndrome)  Diabetes mellitus  Asthma  Hypertension   ASSESSMENT AND PLAN  PULMONARY  Lab 01/31/12 0419 01/30/12 1133 01/30/12 0525 01/29/12 1459 01/29/12 0405  PHART 7.399 7.308* 7.198* 7.237* 7.250*  PCO2ART 46.6* 54.3* 69.3* 44.5 48.1*  PO2ART 69.9* 70.0* 82.6 110.0* 82.8  HCO3 28.3* 27.1* 25.7* 19.0* 20.2  O2SAT 94.5 91.0 95.0 97.0 94.6   Ventilator Settings: Vent Mode:  [-] PRVC FiO2 (%):  [49.7 %-50.5 %] 50.2 % Set Rate:  [30 bmp] 30 bmp Vt Set:  [380 mL] 380 mL PEEP:  [8 cmH20-10 cmH20] 8 cmH20 Plateau Pressure:  [17 cmH20-31  cmH20] 21 cmH20  CXR:  7/7 Stable dense left lower lobe pneumonia and mild right basilar atelectasis. Resolution of interstitial pulmonary edema with pulmonary venous hypertension persisting. No new abnormalities.   ETT:  7/3 >>  A:   ARDS likely due to HCAP vs. Sepsis from appendicitis;  Asthma without exacerbation but wheezing today.  Paralysis started 7/5 for severe ARDS  P:   - ARDS protocol  - cont sedation protocol  - d/c paralytics - see ID for antibiotics - Continue bronchodilators, solumedrol - RR at max, no air trapping this AM (7/7) but this is a problem for her, monitor closely - pcxr daily   CARDIOVASCULAR  Lab 01/28/12 1322 01/28/12 0647 01/28/12 0644  TROPONINI <0.30 -- <0.30  LATICACIDVEN -- 0.7 --  PROBNP -- -- 1340.0*   ECG:  pending Lines: 7/4 R IJ>>> Echo: 7/5 Normal anatomy and function. EF of 65-70%  A:  septic shock, worsened periodically with air trapping -resolving.  P:  - Levo off 7/7 - Contiue to hold home lisinopril/HCTZ, dc in setting arf - Keep CVP < 4 for ARDS when off pressors   RENAL  Lab 01/31/12 0430 01/30/12 1035 01/30/12 1030 01/29/12 1335 01/29/12 0500 01/28/12 0644  NA 141 -- 137 -- 136 135  K 3.8 -- 3.7 -- -- --  CL 104 -- 102 -- 104 102  CO2 28 -- 26 -- 21 17*  BUN 27* -- 23 -- 23 15  CREATININE 0.49* 0.73 0.59 0.77 0.74 --  CALCIUM 9.0 -- 8.4 -- 8.6 8.2*  MG -- -- -- -- 2.0 --  PHOS -- -- -- -- 3.3 --   Intake/Output      07/06 0701 - 07/07 0700 07/07 0701 - 07/08 0700   I.V. (mL/kg) 1050.4 (10.4) 116 (1.1)   Other     NG/GT 790 80   IV Piggyback 700 200   Total Intake(mL/kg) 2540.4 (25.2) 396 (3.9)   Urine (mL/kg/hr) 3925 (1.6) 275 (0.5)   Drains 265    Total Output 4190 275   Net -1649.6 +121         Foley:  7/4  A:   No acute issues.    P:   - BMET in am  - ARDS noted, cvp 10 , may need lasix restart in future, follow pressor needs - follow uop   GASTROINTESTINAL  Lab 01/30/12 0400 01/29/12  0500 01/28/12 0644  AST 27 18 19   ALT 13 10 12   ALKPHOS -- 91 112  BILITOT -- 0.1* 0.1*  PROT -- 5.7* 5.2*  ALBUMIN -- 2.0* 1.8*    A:   S/p Appy protein calorie malnutrition.  Minimal serosaguanous JP drain output   P:   - continue tube feedings - follow JP drain output - see ID for ABX  HEMATOLOGIC  Lab 01/31/12 0430 01/29/12 1335 01/29/12 0500 01/28/12 1024  HGB 9.3* 8.7* 9.3* 8.9*  HCT 29.3* 27.4* 29.9* 27.6*  PLT 404* 368 405* 300  INR -- -- -- 1.23  APTT -- -- -- --   A:  No acute issues. Likely volume depleted despite fluid status being positive since admission. Anemic, but unsure of baseline. Stable today at 8.7.  P:  -follow cbc - hep sub q  INFECTIOUS  Lab 01/31/12 0430 01/29/12 1335 01/29/12 0500 01/28/12 1024 01/28/12 0644  WBC 8.9 14.2* 20.2* 11.2* --  PROCALCITON -- -- -- -- 1.10   Cultures: 7/5 resp >>neg 7/2 blood >>  Antibiotics: 7/2 Vanc HCAP >> 7/2 Imipenim (HCAP/Appendicitis) >> 7/5  Micafungin>>>  A:  HCAP and appendicitis. WBC improved today but afebrile.  Cx negative so far.  P:   - CBC in am - vanc/imi - follow blood and resp cultures - follow JP drain output - Appreciate surgery - abx / cultures as above  ENDOCRINE  Lab 01/31/12 1140 01/31/12 0723 01/31/12 0351 01/30/12 2354 01/30/12 2211  GLUCAP 289* 241* 233* 312* 346*   A:   DM2.   P:   - continue moderate SSI and Lantus as sugars appropriate for change.  - Continue to hold home Metformin  NEUROLOGIC  A:   Fentanyl 219mcg/hr and versed 1mg /hr and vecuronium (Vec for 48 hours for paralysis due to severe ARDS)  P:   -Vec for 48 hours for paralysis due to severe ARDS-->completed 7/7, d/c -wean sedation for vent synchrony   BEST PRACTICE / DISPOSITION Level of Care:  ICU Primary Service:  PCCM Consultants: none  Code Status:  full Diet:  Enteral feedings DVT Px:  Sub q hep GI Px:  pepcid Skin Integrity:  normal Social / Family:  Family updated and w/ pt on  7/7   Canary Brim, NP-C [facility] Pgr: [phone] or [phone]  Attending:  I have seen and examined the patient with nurse practitioner/resident and agree with the note above.   D/C vec Wheezing improved, d/c steroids given paralytics No airtrapping, likely need to d/c RR once vec off  Cont antibiotics  Karyme Mcconathy  [place] PCCM Pager: [phone] Cell: [phone] If no response, call [phone]

## 2012-02-01 ENCOUNTER — Inpatient Hospital Stay (HOSPITAL_COMMUNITY): Payer: Medicaid - Out of State

## 2012-02-01 LAB — BLOOD GAS, ARTERIAL
Acid-base deficit: 5.7 mmol/L — ABNORMAL HIGH (ref 0.0–2.0)
Bicarbonate: 20.2 mEq/L (ref 20.0–24.0)
Drawn by: 34779
FIO2: 0.6 %
O2 Saturation: 94.6 %
RATE: 18 resp/min
TCO2: 21.7 mmol/L (ref 0–100)
pCO2 arterial: 48.1 mmHg — ABNORMAL HIGH (ref 35.0–45.0)
pO2, Arterial: 82.8 mmHg (ref 80.0–100.0)

## 2012-02-01 LAB — GLUCOSE, CAPILLARY
Glucose-Capillary: 141 mg/dL — ABNORMAL HIGH (ref 70–99)
Glucose-Capillary: 191 mg/dL — ABNORMAL HIGH (ref 70–99)

## 2012-02-01 LAB — POCT I-STAT 3, ART BLOOD GAS (G3+)
Bicarbonate: 33.4 mEq/L — ABNORMAL HIGH (ref 20.0–24.0)
pH, Arterial: 7.434 (ref 7.350–7.450)
pO2, Arterial: 103 mmHg — ABNORMAL HIGH (ref 80.0–100.0)

## 2012-02-01 LAB — BASIC METABOLIC PANEL
Calcium: 8.7 mg/dL (ref 8.4–10.5)
Chloride: 105 mEq/L (ref 96–112)
Creatinine, Ser: 0.54 mg/dL (ref 0.50–1.10)
GFR calc Af Amer: >90 mL/min (ref >90)
GFR calc Af Amer: >90 mL/min (ref >90)
GFR calc non Af Amer: >90 mL/min (ref >90)
Glucose, Bld: 194 mg/dL — ABNORMAL HIGH (ref 70–99)
Potassium: 3.2 mEq/L — ABNORMAL LOW (ref 3.5–5.1)
Sodium: 144 mEq/L (ref 135–145)
Sodium: 143 mEq/L (ref 135–145)

## 2012-02-01 LAB — CBC
HCT: 27 % — ABNORMAL LOW (ref 36.0–46.0)
Platelets: 436 10*3/uL — ABNORMAL HIGH (ref 150–400)
RDW: 15.1 % (ref 11.5–15.5)
WBC: 13.8 10*3/uL — ABNORMAL HIGH (ref 4.0–10.5)

## 2012-02-01 LAB — CK: Total CK: 10 U/L (ref 7–177)

## 2012-02-01 MED ORDER — POTASSIUM CHLORIDE 20 MEQ/15ML (10%) PO LIQD
40.0000 meq | Freq: Every day | ORAL | Status: AC
  Administered 2012-02-01: 40 meq via ORAL
  Filled 2012-02-01: qty 30

## 2012-02-01 MED ORDER — SODIUM CHLORIDE 0.9 % IV BOLUS (SEPSIS)
250.0000 mL | Freq: Once | INTRAVENOUS | Status: AC
  Administered 2012-02-01: 250 mL via INTRAVENOUS

## 2012-02-01 MED ORDER — SODIUM CHLORIDE 0.9 % IV SOLN
INTRAVENOUS | Status: DC
  Administered 2012-02-01: 20 mL/h via INTRAVENOUS
  Administered 2012-02-03: 500 mL via INTRAVENOUS
  Administered 2012-02-04: 10 mL/h via INTRAVENOUS

## 2012-02-01 MED ORDER — POTASSIUM CHLORIDE 10 MEQ/50ML IV SOLN
INTRAVENOUS | Status: AC
  Filled 2012-02-01: qty 50

## 2012-02-01 MED ORDER — POTASSIUM CHLORIDE 10 MEQ/50ML IV SOLN
10.0000 meq | INTRAVENOUS | Status: AC
  Administered 2012-02-01 (×5): 10 meq via INTRAVENOUS
  Filled 2012-02-01 (×2): qty 50
  Filled 2012-02-01: qty 100

## 2012-02-01 NOTE — Progress Notes (Signed)
eLink Physician-Brief Progress Note Patient Name: [name] DOB: [birth year] MRN: [id number]  Date of Service  02/01/2012   HPI/Events of Note  hypokalemia   eICU Interventions  Potassium replaced   Intervention Category Intermediate Interventions: Electrolyte abnormality - evaluation and management  Sapphira Harjo 02/01/2012, 6:13 AM

## 2012-02-01 NOTE — Progress Notes (Signed)
Name: [name] MRN: [id number] DOB: 1971-07-11    LOS: 4  Referring Provider:  [facility] Reason for Referral:  Acute respiratory failure   PULMONARY / CRITICAL CARE MEDICINE  Brief patient description:  41 y/o female with DM2, who developed ARDS on 7/3 after a lap appy on 7/1 at [facility].  She was transferred to [facility] on 01/28/2012.  Events Since Admission: 7/3 CT Ab>> bibasilar airspace disease, fluid near appy site and drainage catheter, unremarkable small bowel, stomach, colon.   7/4 L Radial Art Line>>7/5 7/5 Paralytics>>>7/7  Current Status:  Intubated, sedated  Vital Signs: Temp:  [98 F (36.7 C)-99.3 F (37.4 C)] 98 F (36.7 C) (07/08 0403) Pulse Rate:  [63-128] 75  (07/08 0600) Resp:  [23-38] 30  (07/08 0600) BP: (97-182)/(38-90) 107/55 mmHg (07/08 0600) SpO2:  [93 %-99 %] 94 % (07/08 0600) FiO2 (%):  [39.4 %-50.3 %] 39.7 % (07/08 0600) Weight:  [220 lb 3.8 oz (99.9 kg)] 220 lb 3.8 oz (99.9 kg) (07/08 0500)  Intake/Output Summary (Last 24 hours) at 02/01/12 1034 Last data filed at 02/01/12 1000  Gross per 24 hour  Intake 3536.5 ml  Output   4605 ml  Net -1068.5 ml    Physical Examination: Gen: sedated on vent PULM: course breath sounds bilaterally, No wheezes CV: RRR, no mgr, no JVD AB: BS present, soft,  Ext: warm, no edema, no clubbing, no cyanosis Derm: no rash or skin breakdown Neuro: Sedated, responds to stimuli  Principal Problem:  *Acute respiratory failure with hypoxia Active Problems:  Appendicitis  ARDS (adult respiratory distress syndrome)  Diabetes mellitus  Asthma  Hypertension   ASSESSMENT AND PLAN  PULMONARY  Lab 01/31/12 0419 01/30/12 1133 01/30/12 0525 01/29/12 1459 01/29/12 0405  PHART 7.399 7.308* 7.198* 7.237* 7.250*  PCO2ART 46.6* 54.3* 69.3* 44.5 48.1*  PO2ART 69.9* 70.0* 82.6 110.0* 82.8  HCO3 28.3* 27.1* 25.7* 19.0* 20.2  O2SAT 94.5 91.0 95.0 97.0 94.6   Ventilator Settings: Vent Mode:  [-]  PRVC FiO2 (%):  [39.4 %-50.3 %] 39.7 % Set Rate:  [30 bmp] 30 bmp Vt Set:  [380 mL] 380 mL PEEP:  [5 cmH20-8 cmH20] 5 cmH20 Plateau Pressure:  [16 cmH20-21 cmH20] 16 cmH20  CXR: 7/8 Endotracheal tube tip 3.5 cm above the carina. Consolidation left base may represent infiltrate and / or  atelectasis superimposed upon the pulmonary edema.   ETT:  7/3 >>  A:   ARDS likely due to HCAP vs. Sepsis from appendicitis; Asthma with possible exacerbation Paralysis started 7/5 for severe ARDS and DC on 7/7. ABG pending  P:   - ARDS protocol, could consider liberalization of tidal volumes to allow smoother wake-up - cont sedation protocol  - ABG pending 7/8 - see ID for antibiotics - Continue bronchodilators, solumedrol x 1 given for possible bronchospasm - Monitor for air trapping - pcxr daily   CARDIOVASCULAR  Lab 01/28/12 1322 01/28/12 0647 01/28/12 0644  TROPONINI <0.30 -- <0.30  LATICACIDVEN -- 0.7 --  PROBNP -- -- 1340.0*   ECG:  pending Lines: 7/4 R IJ>>> Echo: 7/5 Normal anatomy and function. EF of 65-70%  A:  septic shock, worsened periodically with air trapping -resolving. Levo off on 7/7 P:  - Keep CVP goal 5-7 for ARDS when off pressors   RENAL  Lab 02/01/12 0400 01/31/12 0430 01/30/12 1035 01/30/12 1030 01/29/12 1335 01/29/12 0500 01/28/12 0644  NA 144 141 -- 137 -- 136 135  K 3.0* 3.8 -- -- -- -- --  CL 105 104 -- 102 -- 104 102  CO2 32 28 -- 26 -- 21 17*  BUN 33* 27* -- 23 -- 23 15  CREATININE 0.54 0.49* 0.73 0.59 0.77 -- --  CALCIUM 8.8 9.0 -- 8.4 -- 8.6 8.2*  MG 1.9 -- -- -- -- 2.0 --  PHOS -- -- -- -- -- 3.3 --   Intake/Output      07/07 0701 - 07/08 0700   I.V. (mL/kg) 618.5 (6.2)   Other 800   NG/GT 320   IV Piggyback 1454   Total Intake(mL/kg) 3192.5 (32)   Urine (mL/kg/hr) 4475 (1.9)   Drains 145   Total Output 4620   Net -1427.5        Foley:  7/4  A:   No acute issues.  UOP of 4.5L in 24hrs. Hypo K  P:   - BMET in am  - follow  uop - K repletion today  GASTROINTESTINAL  Lab 02/01/12 0400 01/30/12 0400 01/29/12 0500 01/28/12 0644  AST 17 27 18 19   ALT 16 13 10 12   ALKPHOS -- -- 91 112  BILITOT -- -- 0.1* 0.1*  PROT -- -- 5.7* 5.2*  ALBUMIN -- -- 2.0* 1.8*    A:   S/p Appy protein calorie malnutrition.  175cc serosaguanous JP drain output in 24hrs  P:   - continue tube feedings - follow JP drain output. Surgery to decide when out.  - see ID for ABX  HEMATOLOGIC  Lab 02/01/12 0400 01/31/12 0430 01/29/12 1335 01/29/12 0500 01/28/12 1024  HGB 8.6* 9.3* 8.7* 9.3* 8.9*  HCT 27.0* 29.3* 27.4* 29.9* 27.6*  PLT 436* 404* 368 405* 300  INR -- -- -- -- 1.23  APTT -- -- -- -- --   A:  No acute issues. Likely volume depleted despite fluid status being positive since admission. Anemic, but unsure of baseline. Stable today at 8.6.  P:  -follow cbc - hep sub q  INFECTIOUS  Lab 02/01/12 0400 01/31/12 0430 01/29/12 1335 01/29/12 0500 01/28/12 1024 01/28/12 0644  WBC 13.8* 8.9 14.2* 20.2* 11.2* --  PROCALCITON -- -- -- -- -- 1.10   Cultures: 7/5 resp >>neg 7/2 blood >> No Growth  Antibiotics: 7/2 Vanc HCAP >> 7/2 Imipenim (HCAP/Appendicitis) >> 7/5  Micafungin>>>  A:  HCAP (unlikely at this point given improved CXR) and appendicitis. WBC improved today but afebrile.  Cx negative so far.  P:   - CBC in am - vanc/imi - follow blood and resp cultures likely to remain negative  - follow JP drain output - Appreciate surgery recs - abx / cultures as above  ENDOCRINE  Lab 02/01/12 0401 01/31/12 2343 01/31/12 2154 01/31/12 1927 01/31/12 1551  GLUCAP 189* 275* 343* 414* 405*   A:   DM2. CBG improved.   P:   - continue moderate SSI and Lantus as sugars appropriate for change.  - Continue to hold home Metformin  NEUROLOGIC  A:   Still on Fentanyl 24mcg/hr and versed 1mg /hr.  Vec off since 7/7. Moves spontaneously and to painful stimuli P:   - SBT today  BEST PRACTICE / DISPOSITION Level of  Care:  ICU Primary Service:  PCCM Consultants: none  Code Status:  full Diet:  Enteral feedings DVT Px:  Sub q hep GI Px:  pepcid Skin Integrity:  normal Social / Family:  Family updated and w/ pt on 7/7   Shelly Flatten, MD Family Medicine PGY-2 02/01/2012, 6:34 AM   Attending Addendum:  I have seen the patient, discussed the issues, test results and plans with Dr Konrad Dolores.  I agree with the Assessment and Plans as ammended above.   Levy Pupa, MD, PhD 02/01/2012, 10:40 AM [facility] [phone] or if no answer [phone]

## 2012-02-01 NOTE — Progress Notes (Signed)
I have seen and examined the patient and agree with the assessment and plans.  Katalyna Socarras A. Thayer Embleton  MD, FACS  

## 2012-02-01 NOTE — Progress Notes (Signed)
ANTIBIOTIC CONSULT NOTE - Follow-Up  Pharmacy Consult for Vancomycin Indication: rule out sepsis/HCAP/appendicitis  No Known Allergies  Patient Measurements: Height: 5\' 8"  (172.7 cm) Weight: 220 lb 3.8 oz (99.9 kg) IBW/kg (Calculated) : 63.9  Adjusted Body Weight: 80  Vital Signs: Temp: 97.9 F (36.6 C) (07/08 0836) Temp src: Oral (07/08 0836) BP: 115/61 mmHg (07/08 1100) Pulse Rate: 87  (07/08 1100) Intake/Output from previous day: 07/07 0701 - 07/08 0700 In: 3238 [I.V.:644; NG/GT:340; IV Piggyback:1454] Out: 4650 [Urine:4475; Drains:175] Intake/Output from this shift: Total I/O In: 1011.5 [I.V.:197.5; NG/GT:210; IV Piggyback:604] Out: 1405 [Urine:1405]  Labs (at [facility]): SCr 0.71   Basename 02/01/12 0400 01/31/12 0430 01/30/12 1035 01/29/12 1335  WBC 13.8* 8.9 -- 14.2*  HGB 8.6* 9.3* -- 8.7*  PLT 436* 404* -- 368  LABCREA -- -- -- --  CREATININE 0.54 0.49* 0.73 --   Estimated Creatinine Clearance: 113.2 ml/min (by C-G formula based on Cr of 0.54).  Basename 01/30/12 1850  VANCOTROUGH 23.7*  VANCOPEAK --  VANCORANDOM --  GENTTROUGH --  GENTPEAK --  GENTRANDOM --  TOBRATROUGH --  TOBRAPEAK --  TOBRARND --  AMIKACINPEAK --  AMIKACINTROU --  AMIKACIN --     Microbiology: Recent Results (from the past 720 hour(s))  MRSA PCR SCREENING     Status: Normal   Collection Time   01/28/12  5:36 AM      Component Value Range Status Comment   MRSA by PCR NEGATIVE  NEGATIVE Final   CULTURE, BLOOD (ROUTINE X 2)     Status: Normal (Preliminary result)   Collection Time   01/28/12  8:30 AM      Component Value Range Status Comment   Specimen Description BLOOD LEFT ARM   Final    Special Requests BOTTLES DRAWN AEROBIC AND ANAEROBIC 10CC   Final    Culture  Setup Time 01/28/2012 16:55   Final    Culture     Final    Value:        BLOOD CULTURE RECEIVED NO GROWTH TO DATE CULTURE WILL BE HELD FOR 5 DAYS BEFORE ISSUING A FINAL NEGATIVE REPORT   Report Status  PENDING   Incomplete   CULTURE, BLOOD (ROUTINE X 2)     Status: Normal (Preliminary result)   Collection Time   01/28/12  8:45 AM      Component Value Range Status Comment   Specimen Description BLOOD LEFT ARM   Final    Special Requests BOTTLES DRAWN AEROBIC AND ANAEROBIC 10CC   Final    Culture  Setup Time 01/28/2012 16:55   Final    Culture     Final    Value:        BLOOD CULTURE RECEIVED NO GROWTH TO DATE CULTURE WILL BE HELD FOR 5 DAYS BEFORE ISSUING A FINAL NEGATIVE REPORT   Report Status PENDING   Incomplete   CULTURE, RESPIRATORY     Status: Normal   Collection Time   01/29/12 11:09 AM      Component Value Range Status Comment   Specimen Description TRACHEAL ASPIRATE   Final    Special Requests NONE   Final    Gram Stain     Final    Value: FEW WBC PRESENT,BOTH PMN AND MONONUCLEAR     FEW SQUAMOUS EPITHELIAL CELLS PRESENT     NO ORGANISMS SEEN   Culture NO GROWTH 2 DAYS   Final    Report Status 01/31/2012 FINAL   Final  Medical History: Past Medical History  Diagnosis Date  . Peripheral vascular disease   . Hypertension   . Anginal pain   . Anxiety   . Depression   . Asthma   . COPD (chronic obstructive pulmonary disease)   . Shortness of breath   . Tuberculosis   . GERD (gastroesophageal reflux disease)   . Headache   . Arthritis   . Diabetes mellitus 01/28/2012    Home Medications:  Metformin  Zantac  Lortab  Zestoretic  ASA  Coreg  Gabapentin  Assessment: 41 yo female with ARDS/sepsis/appendicitis started on empiric antibiotics.  JP drain remains and plan is to continue until out.  Cultures have not revealed anything, WBC increased, and is afebrile.  vanc 7/2>> primaxin 7/2 >> Mycamine 7/5>>  7/5 Rep>>neg 7/4 Bld x2>>NGTD 7/4 MRSA PCR: neg   Goal of Therapy:  Vancomycin trough level 15-20 mcg/ml  Plan:  - Cont vanc 1g IV q12 - Cont imipenem 500 q6h - f/u renal fxn, cx, drains, vancomycin levels as necessary   Rolland Porter, Pharm.D.,  BCPS Clinical Pharmacist Pager: [phone] 02/01/2012,11:59 AM

## 2012-02-01 NOTE — Progress Notes (Signed)
UR Completed.  Jazilyn Siegenthaler Jane [phone] 02/01/2012  

## 2012-02-01 NOTE — Progress Notes (Signed)
Subjective: Remains on ventilator, unresponsive to voice, will withdraw to painful stimuli.  Objective: Vital signs in last 24 hours: Temp:  [98 F (36.7 C)-99.3 F (37.4 C)] 98 F (36.7 C) (07/08 0403) Pulse Rate:  [63-128] 79  (07/08 0800) Resp:  [23-38] 29  (07/08 0800) BP: (97-182)/(38-90) 117/62 mmHg (07/08 0800) SpO2:  [93 %-99 %] 96 % (07/08 0800) FiO2 (%):  [39.4 %-50.3 %] 40.2 % (07/08 0800) Weight:  [220 lb 3.8 oz (99.9 kg)] 220 lb 3.8 oz (99.9 kg) (07/08 0500) Last BM Date: 01/31/12  Intake/Output from previous day: 07/07 0701 - 07/08 0700 In: 3238 [I.V.:644; NG/GT:340; IV Piggyback:1454] Out: 4650 [Urine:4475; Drains:175] Intake/Output this shift: Total I/O In: 110 [NG/GT:60; IV Piggyback:50] Out: 155 [Urine:155]  General appearance: no distress and mildly obese, on ventilator. Abdomen: JP drain in RLQ draining scant amount of serous appearing fluid (175 ml over past 24 hours) all other surgical wounds appear to be healing well, no visible drainage from these sites. Abdomen is otherwise soft, with +BS, no BM recorded.  Lab Results:   Basename 02/01/12 0400 01/31/12 0430  WBC 13.8* 8.9  HGB 8.6* 9.3*  HCT 27.0* 29.3*  PLT 436* 404*   BMET  Basename 02/01/12 0400 01/31/12 0430  NA 144 141  K 3.0* 3.8  CL 105 104  CO2 32 28  GLUCOSE 194* 262*  BUN 33* 27*  CREATININE 0.54 0.49*  CALCIUM 8.8 9.0   PT/INR No results found for this basename: LABPROT:2,INR:2 in the last 72 hours ABG  Basename 01/31/12 0419 01/30/12 1133  PHART 7.399 7.308*  HCO3 28.3* 27.1*    Studies/Results: Dg Chest Port 1 View  02/01/2012  *RADIOLOGY REPORT*  Clinical Data: Evaluate endotracheal tube.  PORTABLE CHEST - 1 VIEW  Comparison: 01/31/2012.  Findings: Rotation to the left.  Endotracheal tube tip 3.5 cm above the carina.  Right central line tip at the expected level of the proximal superior vena cava. Nasogastric tube courses below the diaphragm.  The tip is not  included on this exam.  Consolidation left base may represent infiltrate and / or atelectasis superimposed upon the pulmonary edema.  Heart size top normal slightly enlarged.  Aorta incompletely assessed.  IMPRESSION: Endotracheal tube tip 3.5 cm above the carina.  Consolidation left base may represent infiltrate and / or atelectasis superimposed upon the pulmonary edema.  Original Report Authenticated By: Fuller Canada, M.D.   Dg Chest Port 1 View  01/31/2012  *RADIOLOGY REPORT*  Clinical Data: Ventilator dependent respiratory failure.  Follow up pulmonary edema/ARDS and left lower lobe atelectasis/pneumonia.  PORTABLE CHEST - 1 VIEW 01/31/2012 0547 hours:  Comparison: Portable chest x-rays yesterday and dating back to 01/27/2011.  Findings: Endotracheal tube tip in satisfactory position approximately 7 cm above the carina.  Right jugular central venous catheter tip in the SVC.  Nasogastric courses below the diaphragm into the stomach.  Cardiac silhouette enlarged but stable.  Stable dense left lower lobe consolidation with air bronchograms. Resolution of pulmonary edema with pulmonary venous hypertension persisting.  Stable mild atelectasis at the right lung base.  No new pulmonary parenchymal abnormalities.  IMPRESSION: Support apparatus satisfactory.  Stable dense left lower lobe pneumonia and mild right basilar atelectasis.  Resolution of interstitial pulmonary edema with pulmonary venous hypertension persisting.  No new abnormalities.  Original Report Authenticated By: Arnell Sieving, M.D.   Dg Chest Port 1 View  01/30/2012  *RADIOLOGY REPORT*  Clinical Data: Ventilator dependent respiratory failure.  Follow up edema  and atelectasis versus pneumonia.  PORTABLE CHEST - 1 VIEW 01/30/2012 1153 hours:  Comparison: Portable chest x-ray earlier same date 0539 hours and dating back to 01/27/2012.  Findings: Endotracheal tube tip in satisfactory position approximately 5 cm above the carina.  Right jugular  central venous catheter tip in the upper SVC.  Nasogastric tube courses below the diaphragm into the stomach.  Cardiac silhouette enlarged but stable.  Interval improvement in the interstitial and airspace pulmonary edema since earlier in the day, with only minimal interstitial edema persisting.  Improved aeration in the lung bases, though moderate consolidation persists, left greater than right.  No new pulmonary parenchymal abnormalities.  IMPRESSION: Support apparatus satisfactory.  Improved pulmonary edema since earlier same date, with only minimal interstitial edema persisting. Improved aeration in the lung bases, with persistent atelectasis and/or pneumonia, left greater than right.  No new abnormalities.  Original Report Authenticated By: Arnell Sieving, M.D.    Anti-infectives: Anti-infectives     Start     Dose/Rate Route Frequency Ordered Stop   01/31/12 0030   vancomycin (VANCOCIN) IVPB 1000 mg/200 mL premix        1,000 mg 200 mL/hr over 60 Minutes Intravenous Every 12 hours 01/30/12 1947     01/29/12 1400   micafungin (MYCAMINE) 100 mg in sodium chloride 0.9 % 100 mL IVPB        100 mg 100 mL/hr over 1 Hours Intravenous Daily 01/29/12 1338     01/28/12 1200   vancomycin (VANCOCIN) IVPB 1000 mg/200 mL premix  Status:  Discontinued        1,000 mg 200 mL/hr over 60 Minutes Intravenous Every 8 hours 01/28/12 0739 01/30/12 1947   01/28/12 0900   imipenem-cilastatin (PRIMAXIN) 500 mg in sodium chloride 0.9 % 100 mL IVPB        500 mg 200 mL/hr over 30 Minutes Intravenous Every 6 hours 01/28/12 0739            Assessment/Plan: s/p * No surgery found * 1. Continue to follow 2. Medical management as per CCM   LOS: 4 days    Rein Popov 02/01/2012

## 2012-02-01 NOTE — Progress Notes (Signed)
Spoke with Elink RN for 2nd notification regarding K 3.2, no new orders received at this time, MD aware.

## 2012-02-02 ENCOUNTER — Inpatient Hospital Stay (HOSPITAL_COMMUNITY): Payer: Medicaid - Out of State

## 2012-02-02 LAB — GLUCOSE, CAPILLARY
Glucose-Capillary: 120 mg/dL — ABNORMAL HIGH (ref 70–99)
Glucose-Capillary: 172 mg/dL — ABNORMAL HIGH (ref 70–99)

## 2012-02-02 LAB — BASIC METABOLIC PANEL
CO2: 36 mEq/L — ABNORMAL HIGH (ref 19–32)
Calcium: 8.6 mg/dL (ref 8.4–10.5)
Creatinine, Ser: 0.51 mg/dL (ref 0.50–1.10)
GFR calc non Af Amer: >90 mL/min (ref >90)

## 2012-02-02 LAB — POCT I-STAT 3, ART BLOOD GAS (G3+)
Acid-Base Excess: 16 mmol/L — ABNORMAL HIGH (ref 0.0–2.0)
Bicarbonate: 40.6 mEq/L — ABNORMAL HIGH (ref 20.0–24.0)
Patient temperature: 99.1

## 2012-02-02 MED ORDER — POTASSIUM CHLORIDE 20 MEQ/15ML (10%) PO LIQD
ORAL | Status: AC
  Filled 2012-02-02: qty 30

## 2012-02-02 MED ORDER — POTASSIUM CHLORIDE 10 MEQ/50ML IV SOLN
10.0000 meq | INTRAVENOUS | Status: DC
  Administered 2012-02-02 (×4): 10 meq via INTRAVENOUS
  Filled 2012-02-02: qty 200
  Filled 2012-02-02: qty 50

## 2012-02-02 MED ORDER — POTASSIUM CHLORIDE 10 MEQ/50ML IV SOLN
10.0000 meq | INTRAVENOUS | Status: AC
  Administered 2012-02-02 (×2): 10 meq via INTRAVENOUS
  Filled 2012-02-02: qty 50

## 2012-02-02 MED ORDER — POTASSIUM CHLORIDE 20 MEQ/15ML (10%) PO LIQD
40.0000 meq | Freq: Once | ORAL | Status: AC
  Administered 2012-02-02: 40 meq
  Filled 2012-02-02: qty 30

## 2012-02-02 NOTE — Progress Notes (Signed)
Name: [name] MRN: [id number] DOB: 10-Aug-1970    LOS: 5  Referring Provider:  [facility] Reason for Referral:  Acute respiratory failure   PULMONARY / CRITICAL CARE MEDICINE  Brief patient description:  41 y/o female with DM2, who developed ARDS on 7/3 after a lap appy on 7/1 at [facility].  She was transferred to [facility] on 01/28/2012.  Events Since Admission: 7/3 CT Ab>> bibasilar airspace disease, fluid near appy site and drainage catheter, unremarkable small bowel, stomach, colon.   7/4 L Radial Art Line>>7/5 7/5 Paralytics>>>7/7  Current Status:  Intubated, sedated  Vital Signs: Temp:  [97.9 F (36.6 C)-98.9 F (37.2 C)] 98.1 F (36.7 C) (07/09 0500) Pulse Rate:  [70-92] 78  (07/09 0700) Resp:  [14-30] 30  (07/09 0700) BP: (88-129)/(36-62) 108/44 mmHg (07/09 0700) SpO2:  [89 %-97 %] 95 % (07/09 0700) FiO2 (%):  [29.5 %-40.2 %] 29.8 % (07/09 0700) Weight:  [217 lb 9.5 oz (98.7 kg)] 217 lb 9.5 oz (98.7 kg) (07/09 0500)  Intake/Output Summary (Last 24 hours) at 02/02/12 0743 Last data filed at 02/02/12 0600  Gross per 24 hour  Intake 2540.5 ml  Output   7335 ml  Net -4794.5 ml    Physical Examination: Gen: sedated on vent PULM: course breath sounds bilaterally, No wheezes CV: RRR, no mgr, no JVD AB: BS present, soft, JP drain in place Ext: warm, no edema, no clubbing, no cyanosis Derm: no rash or skin breakdown Neuro: Sedated, responds to stimuli  Principal Problem:  *Acute respiratory failure with hypoxia Active Problems:  Appendicitis  ARDS (adult respiratory distress syndrome)  Diabetes mellitus  Asthma  Hypertension   ASSESSMENT AND PLAN  PULMONARY  Lab 02/01/12 0901 01/31/12 0419 01/30/12 1133 01/30/12 0525 01/29/12 1459  PHART 7.434 7.399 7.308* 7.198* 7.237*  PCO2ART 49.7* 46.6* 54.3* 69.3* 44.5  PO2ART 103.0* 69.9* 70.0* 82.6 110.0*  HCO3 33.4* 28.3* 27.1* 25.7* 19.0*  O2SAT 98.0 94.5 91.0 95.0 97.0   Ventilator  Settings: Vent Mode:  [-] PRVC FiO2 (%):  [29.5 %-40.2 %] 29.8 % Set Rate:  [14 bmp-30 bmp] 30 bmp Vt Set:  [380 mL-520 mL] 380 mL PEEP:  [5 cmH20-5.5 cmH20] 5 cmH20 Plateau Pressure:  [19 cmH20-24 cmH20] 21 cmH20  CXR: 7/8 Endotracheal tube tip 3.5 cm above the carina. Consolidation left base may represent infiltrate and / or  atelectasis superimposed upon the pulmonary edema.   ETT:  7/3 >>  A:   ARDS likely due to HCAP vs. Sepsis from appendicitis; Asthma, with possible exacerbation Paralysis started 7/5 for severe ARDS and DC on 7/7. ABG pending  P:   - Continued ARDS protocol, due to clinical trial - cont sedation protocol  - see ID for antibiotics - Continue bronchodilators,  - Monitor for air trapping - pcxr daily - goal extubation 7/9   CARDIOVASCULAR  Lab 01/28/12 1322 01/28/12 0647 01/28/12 0644  TROPONINI <0.30 -- <0.30  LATICACIDVEN -- 0.7 --  PROBNP -- -- 1340.0*   ECG:  pending Lines: 7/4 R IJ>>> Echo: 7/5 Normal anatomy and function. EF of 65-70%  A:  septic shock at outside hospital, worsened periodically with air trapping -resolving. Levo off on 7/7 P:  - Keep CVP goal 5-7 for ARDS when off pressors   RENAL  Lab 02/01/12 1830 02/01/12 0400 01/31/12 0430 01/30/12 1035 01/30/12 1030 01/29/12 0500  NA 143 144 141 -- 137 136  K 3.2* 3.0* -- -- -- --  CL 99 105 104 --  102 104  CO2 37* 32 28 -- 26 21  BUN 33* 33* 27* -- 23 23  CREATININE 0.55 0.54 0.49* 0.73 0.59 --  CALCIUM 8.7 8.8 9.0 -- 8.4 8.6  MG -- 1.9 -- -- -- 2.0  PHOS -- -- -- -- -- 3.3   Intake/Output      07/08 0701 - 07/09 0700 07/09 0701 - 07/10 0700   I.V. (mL/kg) 1148.5 (11.6)    Other     NG/GT 490    IV Piggyback 1012    Total Intake(mL/kg) 2650.5 (26.9)    Urine (mL/kg/hr) 7375 (3.1)    Drains 65    Stool 50    Total Output 7490    Net -4839.5          Foley:  7/4  A:   No acute issues.  UOP of 7.4L in 24hrs. 120 Mg Lasix IV yesterday. Hypo K. BMET pending.   P:     - BMET in am  - follow uop - K repletion today after BMET returns  GASTROINTESTINAL  Lab 02/01/12 0400 01/30/12 0400 01/29/12 0500 01/28/12 0644  AST 17 27 18 19   ALT 16 13 10 12   ALKPHOS -- -- 91 112  BILITOT -- -- 0.1* 0.1*  PROT -- -- 5.7* 5.2*  ALBUMIN -- -- 2.0* 1.8*    A:   S/p Appy protein calorie malnutrition.  65cc serosaguanous JP drain output in 24hrs  P:   - continue tube feedings - follow JP drain output. Surgery d/c'd 7/9  - see ID for ABX  HEMATOLOGIC  Lab 02/01/12 0400 01/31/12 0430 01/29/12 1335 01/29/12 0500 01/28/12 1024  HGB 8.6* 9.3* 8.7* 9.3* 8.9*  HCT 27.0* 29.3* 27.4* 29.9* 27.6*  PLT 436* 404* 368 405* 300  INR -- -- -- -- 1.23  APTT -- -- -- -- --   A:  No acute issues. Likely volume depleted despite fluid status being positive since admission. Anemic, but unsure of baseline. Stable today at 8.6.  P:  - follow cbc - hep sub q  INFECTIOUS  Lab 02/01/12 0400 01/31/12 0430 01/29/12 1335 01/29/12 0500 01/28/12 1024 01/28/12 0644  WBC 13.8* 8.9 14.2* 20.2* 11.2* --  PROCALCITON -- -- -- -- -- 1.10   Cultures: 7/5 resp >>neg 7/2 blood >> No Growth  Antibiotics: 7/2 Vanc HCAP >> 7/9 7/2 Imipenim (HCAP/Appendicitis) >> 7/5  Micafungin>>>  A:  HCAP (unlikely at this point given improved CXR) and appendicitis.  Cx negative so far. Cx still negative P:   - CBC in am - continue imipenem, d/c vanco 7/9 - follow blood and resp cultures likely to remain negative  - Appreciate surgery recs - abx / cultures as above  ENDOCRINE  Lab 02/02/12 0456 02/01/12 2024 02/01/12 1144 02/01/12 0822 02/01/12 0401  GLUCAP 120* 191* 141* 151* 189*   A:   DM2. CBG improved.   P:   - continue moderate SSI and Lantus as sugars appropriate for change.  - Continue to hold home Metformin  NEUROLOGIC  A:   Still on Fentanyl and versed for sedation.  Vec off since 7/7. Moves spontaneously and to painful stimuli P:   - SBT today  BEST PRACTICE /  DISPOSITION Level of Care:  ICU Primary Service:  PCCM Consultants: none  Code Status:  full Diet:  Enteral feedings DVT Px:  Sub q hep GI Px:  pepcid Skin Integrity:  normal Social / Family:  Family updated and w/ pt on 7/7  CC time 30 minutes  Shelly Flatten, MD Family Medicine PGY-2 02/02/2012, 7:43 AM   Attending Addendum:  I have seen the patient, discussed the issues, test results and plans with B. Veleta Miners, NP. I agree with the Assessment and Plans as ammended above.   Levy Pupa, MD, PhD 02/02/2012, 11:01 AM [facility] [phone] or if no answer [phone]

## 2012-02-02 NOTE — Progress Notes (Signed)
Patient ID: [name], female   DOB: 10-12-1970, 41 y.o.   MRN: [id number]    Subjective: Remains on ventilator, unresponsive to voice, will withdraw to painful stimuli.  Objective: Vital signs in last 24 hours: Temp:  [97.9 F (36.6 C)-98.9 F (37.2 C)] 98.1 F (36.7 C) (07/09 0500) Pulse Rate:  [70-113] 113  (07/09 0745) Resp:  [14-30] 19  (07/09 0745) BP: (88-129)/(36-62) 113/53 mmHg (07/09 0745) SpO2:  [89 %-97 %] 97 % (07/09 0745) FiO2 (%):  [29.5 %-40.2 %] 30 % (07/09 0750) Weight:  [217 lb 9.5 oz (98.7 kg)] 217 lb 9.5 oz (98.7 kg) (07/09 0500) Last BM Date: 02/01/12  Intake/Output from previous day: 07/08 0701 - 07/09 0700 In: 2650.5 [I.V.:1148.5; NG/GT:490; IV Piggyback:1012] Out: 7490 [Urine:7375; Drains:65; Stool:50] Intake/Output this shift:    General appearance: no distress and mildly obese, on ventilator. Abdomen: JP drain in RLQ draining scant amount of serous appearing fluid (65 ml over past 24 hours) all other surgical wounds appear to be healing well, no visible drainage from these sites. Abdomen is otherwise soft, with +BS, no BM recorded.  Lab Results:   Basename 02/01/12 0400 01/31/12 0430  WBC 13.8* 8.9  HGB 8.6* 9.3*  HCT 27.0* 29.3*  PLT 436* 404*   BMET  Basename 02/01/12 1830 02/01/12 0400  NA 143 144  K 3.2* 3.0*  CL 99 105  CO2 37* 32  GLUCOSE 194* 194*  BUN 33* 33*  CREATININE 0.55 0.54  CALCIUM 8.7 8.8   PT/INR No results found for this basename: LABPROT:2,INR:2 in the last 72 hours ABG  Basename 02/01/12 0901 01/31/12 0419  PHART 7.434 7.399  HCO3 33.4* 28.3*    Studies/Results: Dg Chest Port 1 View  02/01/2012  *RADIOLOGY REPORT*  Clinical Data: Evaluate endotracheal tube.  PORTABLE CHEST - 1 VIEW  Comparison: 01/31/2012.  Findings: Rotation to the left.  Endotracheal tube tip 3.5 cm above the carina.  Right central line tip at the expected level of the proximal superior vena cava. Nasogastric tube courses below the  diaphragm.  The tip is not included on this exam.  Consolidation left base may represent infiltrate and / or atelectasis superimposed upon the pulmonary edema.  Heart size top normal slightly enlarged.  Aorta incompletely assessed.  IMPRESSION: Endotracheal tube tip 3.5 cm above the carina.  Consolidation left base may represent infiltrate and / or atelectasis superimposed upon the pulmonary edema.  Original Report Authenticated By: Fuller Canada, M.D.    Anti-infectives: Anti-infectives     Start     Dose/Rate Route Frequency Ordered Stop   01/31/12 0030   vancomycin (VANCOCIN) IVPB 1000 mg/200 mL premix        1,000 mg 200 mL/hr over 60 Minutes Intravenous Every 12 hours 01/30/12 1947     01/29/12 1400   micafungin (MYCAMINE) 100 mg in sodium chloride 0.9 % 100 mL IVPB        100 mg 100 mL/hr over 1 Hours Intravenous Daily 01/29/12 1338     01/28/12 1200   vancomycin (VANCOCIN) IVPB 1000 mg/200 mL premix  Status:  Discontinued        1,000 mg 200 mL/hr over 60 Minutes Intravenous Every 8 hours 01/28/12 0739 01/30/12 1947   01/28/12 0900   imipenem-cilastatin (PRIMAXIN) 500 mg in sodium chloride 0.9 % 100 mL IVPB        500 mg 200 mL/hr over 30 Minutes Intravenous Every 6 hours 01/28/12 0739  Assessment/Plan: s/p * No surgery found * 1. Continue to follow 2. Hypokalemia 3. Possible removal of JP today after discussion with Dr. Magnus Ivan. 4. Medical management as per CCM   LOS: 5 days    [name] 02/02/2012

## 2012-02-02 NOTE — Progress Notes (Signed)
I have seen and examined the patient and agree with the assessment and plans.  Bradlee Bridgers A. Cong Hightower  MD, FACS  

## 2012-02-03 ENCOUNTER — Other Ambulatory Visit: Payer: Self-pay

## 2012-02-03 ENCOUNTER — Inpatient Hospital Stay (HOSPITAL_COMMUNITY): Payer: Medicaid - Out of State

## 2012-02-03 LAB — BASIC METABOLIC PANEL
BUN: 29 mg/dL — ABNORMAL HIGH (ref 6–23)
Chloride: 101 mEq/L (ref 96–112)
GFR calc non Af Amer: >90 mL/min (ref >90)
Glucose, Bld: 195 mg/dL — ABNORMAL HIGH (ref 70–99)
Potassium: 3.4 mEq/L — ABNORMAL LOW (ref 3.5–5.1)

## 2012-02-03 LAB — CBC
HCT: 29.7 % — ABNORMAL LOW (ref 36.0–46.0)
Hemoglobin: 9.2 g/dL — ABNORMAL LOW (ref 12.0–15.0)
MCH: 31.5 pg (ref 26.0–34.0)
MCHC: 31 g/dL (ref 30.0–36.0)

## 2012-02-03 LAB — POCT I-STAT 3, ART BLOOD GAS (G3+)
Acid-Base Excess: 13 mmol/L — ABNORMAL HIGH (ref 0.0–2.0)
Bicarbonate: 36.8 mEq/L — ABNORMAL HIGH (ref 20.0–24.0)
Bicarbonate: 36.7 mEq/L — ABNORMAL HIGH (ref 20.0–24.0)
O2 Saturation: 95 %
O2 Saturation: 96 %
TCO2: 38 mmol/L (ref 0–100)
TCO2: 38 mmol/L (ref 0–100)
pCO2 arterial: 42 mmHg (ref 35.0–45.0)
pCO2 arterial: 42.6 mmHg (ref 35.0–45.0)
pO2, Arterial: 67 mmHg — ABNORMAL LOW (ref 80.0–100.0)
pO2, Arterial: 73 mmHg — ABNORMAL LOW (ref 80.0–100.0)

## 2012-02-03 LAB — GLUCOSE, CAPILLARY
Glucose-Capillary: 133 mg/dL — ABNORMAL HIGH (ref 70–99)
Glucose-Capillary: 153 mg/dL — ABNORMAL HIGH (ref 70–99)
Glucose-Capillary: 167 mg/dL — ABNORMAL HIGH (ref 70–99)
Glucose-Capillary: 172 mg/dL — ABNORMAL HIGH (ref 70–99)
Glucose-Capillary: 183 mg/dL — ABNORMAL HIGH (ref 70–99)
Glucose-Capillary: 203 mg/dL — ABNORMAL HIGH (ref 70–99)

## 2012-02-03 LAB — CULTURE, BLOOD (ROUTINE X 2): Culture: NO GROWTH

## 2012-02-03 MED ORDER — IPRATROPIUM BROMIDE 0.02 % IN SOLN
RESPIRATORY_TRACT | Status: AC
  Administered 2012-02-03: 0.5 mg
  Filled 2012-02-03: qty 2.5

## 2012-02-03 MED ORDER — POTASSIUM CHLORIDE 10 MEQ/50ML IV SOLN: 10.0000 meq | INTRAVENOUS | Status: DC

## 2012-02-03 MED ORDER — FENTANYL CITRATE 0.05 MG/ML IJ SOLN
25.0000 ug | INTRAMUSCULAR | Status: DC | PRN
  Administered 2012-02-03: 50 ug via INTRAVENOUS
  Filled 2012-02-03: qty 2

## 2012-02-03 MED ORDER — ALBUTEROL SULFATE (5 MG/ML) 0.5% IN NEBU
2.5000 mg | INHALATION_SOLUTION | RESPIRATORY_TRACT | Status: DC
  Administered 2012-02-03 – 2012-02-04 (×5): 2.5 mg via RESPIRATORY_TRACT
  Filled 2012-02-03 (×5): qty 0.5

## 2012-02-03 MED ORDER — POTASSIUM CHLORIDE 10 MEQ/50ML IV SOLN
10.0000 meq | INTRAVENOUS | Status: AC
  Administered 2012-02-03 (×2): 10 meq via INTRAVENOUS
  Filled 2012-02-03: qty 150

## 2012-02-03 MED ORDER — IPRATROPIUM BROMIDE 0.02 % IN SOLN
0.5000 mg | RESPIRATORY_TRACT | Status: DC
  Administered 2012-02-03 – 2012-02-04 (×5): 0.5 mg via RESPIRATORY_TRACT
  Filled 2012-02-03 (×5): qty 2.5

## 2012-02-03 MED ORDER — POTASSIUM CHLORIDE 10 MEQ/50ML IV SOLN
10.0000 meq | INTRAVENOUS | Status: DC
  Administered 2012-02-03: 10 meq via INTRAVENOUS
  Filled 2012-02-03 (×2): qty 50

## 2012-02-03 MED ORDER — ALBUTEROL SULFATE (5 MG/ML) 0.5% IN NEBU
INHALATION_SOLUTION | RESPIRATORY_TRACT | Status: AC
  Administered 2012-02-03: 2.5 mg
  Filled 2012-02-03: qty 0.5

## 2012-02-03 MED ORDER — POTASSIUM CHLORIDE 20 MEQ/15ML (10%) PO LIQD
ORAL | Status: AC
  Administered 2012-02-03: 20 meq
  Filled 2012-02-03: qty 30

## 2012-02-03 MED ORDER — POTASSIUM CHLORIDE 20 MEQ/15ML (10%) PO LIQD
40.0000 meq | ORAL | Status: AC
  Administered 2012-02-03 (×2): 40 meq
  Filled 2012-02-03 (×2): qty 30

## 2012-02-03 MED ORDER — IPRATROPIUM-ALBUTEROL 18-103 MCG/ACT IN AERO
2.0000 | INHALATION_SPRAY | RESPIRATORY_TRACT | Status: DC
  Filled 2012-02-03: qty 14.7

## 2012-02-03 MED ORDER — FAMOTIDINE IN NACL 20-0.9 MG/50ML-% IV SOLN
20.0000 mg | Freq: Two times a day (BID) | INTRAVENOUS | Status: DC
  Administered 2012-02-03 – 2012-02-04 (×2): 20 mg via INTRAVENOUS
  Filled 2012-02-03 (×3): qty 50

## 2012-02-03 NOTE — Therapy (Signed)
Speech Language Pathology Treatment Patient Details Name: TIEARA FLITTON MRN: [id number] DOB: Dec 16, 1970 Today's Date: 02/03/2012 Time:  -    Received order for swallow assessment.  Pt. Too sleepy to participate per RN at present.  She will most likely have a better outcome with swallow assessment tomorrow due to extubation 4 hrs ago and intubated for 6 days.  Will see 7/11          Breck Coons Brent Taillon M.Ed [employer] [phone]  02/03/2012

## 2012-02-03 NOTE — Progress Notes (Signed)
Name: [name] MRN: [id number] DOB: 1970-09-27    LOS: 6  Referring Provider:  [facility]  Reason for Referral:  Acute respiratory failure   PULMONARY / CRITICAL CARE MEDICINE  Brief patient description:  41 y/o female with DM2, who developed ARDS on 7/3 after a lap appy on 7/1 at [facility].  She was transferred to [facility] on 01/28/2012.  Events Since Admission: 7/3 CT Ab>> bibasilar airspace disease, fluid near appy site and drainage catheter, unremarkable small bowel, stomach, colon.   7/4 L Radial Art Line>>7/5 7/5 Paralytics>>>7/7 JP drain out on 7/9  Current Status:  Intubated, sedated. Following some commands  Vital Signs: Temp:  [98.2 F (36.8 C)-99.1 F (37.3 C)] 98.2 F (36.8 C) (07/10 0400) Pulse Rate:  [81-119] 97  (07/10 0700) Resp:  [9-30] 24  (07/10 0700) BP: (91-173)/(38-119) 110/49 mmHg (07/10 0700) SpO2:  [93 %-99 %] 97 % (07/10 0700) FiO2 (%):  [29.6 %-98.1 %] 30.1 % (07/10 0700) Weight:  [214 lb 4.6 oz (97.2 kg)] 214 lb 4.6 oz (97.2 kg) (07/10 0500)  Intake/Output Summary (Last 24 hours) at 02/03/12 0728 Last data filed at 02/03/12 0600  Gross per 24 hour  Intake 2707.91 ml  Output   3495 ml  Net -787.09 ml    Physical Examination: Gen: obese, on vent PULM: course breath sounds bilaterally, No wheezes CV: RRR, no mgr, no JVD AB: BS present, soft, JP drain in place Ext: warm, no edema, no clubbing, no cyanosis Derm: no rash or skin breakdown Neuro: Responds to stimuli, follows some commands  Principal Problem:  *Acute respiratory failure with hypoxia Active Problems:  Appendicitis  ARDS (adult respiratory distress syndrome)  Diabetes mellitus  Asthma  Hypertension   ASSESSMENT AND PLAN  PULMONARY  Lab 02/03/12 0527 02/03/12 0405 02/02/12 0902 02/01/12 0901 01/31/12 0419  PHART 7.544* 7.550* 7.533* 7.434 7.399  PCO2ART 42.6 42.0 48.3* 49.7* 46.6*  PO2ART 73.0* 67.0* 62.0* 103.0* 69.9*  HCO3 36.7* 36.8* 40.6* 33.4*  28.3*  O2SAT 96.0 95.0 93.0 98.0 94.5   Ventilator Settings: Vent Mode:  [-] PRVC FiO2 (%):  [29.6 %-98.1 %] 30.1 % Set Rate:  [24 bmp-30 bmp] 24 bmp Vt Set:  [380 mL] 380 mL PEEP:  [5 cmH20] 5 cmH20 Pressure Support:  [8 cmH20-10 cmH20] 10 cmH20 Plateau Pressure:  [18 cmH20-21 cmH20] 18 cmH20  CXR: 7/10. Official read pending but Endotracheal tube tip appears to be approximately 3.5 cm above the carina. Improved aeration w/ decreased infiltrate and atelectatic pattern.   ETT:  7/3 >>  A:   ARDS likely due to HCAP vs. Sepsis from appendicitis; Asthma, with possible exacerbation. Paralysis started 7/5 for severe ARDS and DC on 7/7. Overall improved today w/ significant diuresis, and slow vent wean.   P:   - Weaning with goal extubation 7/10 - see ID for antibiotics - Continue bronchodilators, steroids d/c'd - pcxr daily   CARDIOVASCULAR  Lab 01/28/12 1322 01/28/12 0647 01/28/12 0644  TROPONINI <0.30 -- <0.30  LATICACIDVEN -- 0.7 --  PROBNP -- -- 1340.0*   ECG:  pending Lines: 7/4 R IJ>>> Echo: 7/5 Normal anatomy and function. EF of 65-70%  A:  septic shock at outside hospital, worsened periodically with air trapping -resolving. Levo off on 7/7. FLuid status down 5L since admission. MAP of 55-130 in 24hrs  P:  - CVP variable yesterday. Keep CVP goal 5-7 for ARDS    RENAL  Lab 02/03/12 0500 02/02/12 0830 02/01/12 1830 02/01/12 0400 01/31/12  0430 01/29/12 0500  NA 144 142 143 144 141 --  K 3.4* 3.1* -- -- -- --  CL 101 98 99 105 104 --  CO2 34* 36* 37* 32 28 --  BUN 29* 35* 33* 33* 27* --  CREATININE 0.47* 0.51 0.55 0.54 0.49* --  CALCIUM 8.6 8.6 8.7 8.8 9.0 --  MG -- -- -- 1.9 -- 2.0  PHOS -- -- -- -- -- 3.3   Intake/Output      07/09 0701 - 07/10 0700 07/10 0701 - 07/11 0700   I.V. (mL/kg) 795.9 (8.2)    Other 250    NG/GT 760    IV Piggyback 902    Total Intake(mL/kg) 2707.9 (27.9)    Urine (mL/kg/hr) 3245 (1.4)    Drains     Stool 250    Total Output  3495    Net -787.1          Foley:  7/4  A:   No acute issues.  UOP of 3.25L in 24hrs. Lasix 20mg  x1 yesterday. K repleted but still hypoK.    P:   - BMET in am  - follow uop - K repletion today (IV x10) - Mag in the am  GASTROINTESTINAL  Lab 02/01/12 0400 01/30/12 0400 01/29/12 0500 01/28/12 0644  AST 17 27 18 19   ALT 16 13 10 12   ALKPHOS -- -- 91 112  BILITOT -- -- 0.1* 0.1*  PROT -- -- 5.7* 5.2*  ALBUMIN -- -- 2.0* 1.8*    A:   S/p Appy protein calorie malnutrition.  JP drain out 7/9  P:   - swallow eval for Po intake - see ID for ABX  HEMATOLOGIC  Lab 02/03/12 0500 02/01/12 0400 01/31/12 0430 01/29/12 1335 01/29/12 0500 01/28/12 1024  HGB 9.2* 8.6* 9.3* 8.7* 9.3* --  HCT 29.7* 27.0* 29.3* 27.4* 29.9* --  PLT 410* 436* 404* 368 405* --  INR -- -- -- -- -- 1.23  APTT -- -- -- -- -- --   A:  No acute issues. Anemic, but unsure of baseline. Improved on 7/10 to 9.2 from 8.6 on 7/9.  P:  - follow cbc - hep sub q  INFECTIOUS  Lab 02/03/12 0500 02/01/12 0400 01/31/12 0430 01/29/12 1335 01/29/12 0500 01/28/12 0644  WBC 12.2* 13.8* 8.9 14.2* 20.2* --  PROCALCITON -- -- -- -- -- 1.10   Cultures: 7/5 resp >>neg 7/2 blood >> No Growth  Antibiotics: 7/2 Vanc HCAP >> 7/9 7/2 Imipenim (HCAP/Appendicitis) >> 7/5  Micafungin>>> 7/10  A:  HCAP (unlikely at this point given improved CXR) and appendicitis.  Cx still negative. DC Vanc 7/9 P:   - continue imipenem, d/c micafungin today - follow blood and resp cultures likely to remain negative  - Appreciate surgery recs - abx / cultures as above  ENDOCRINE  Lab 02/03/12 0359 02/02/12 2343 02/02/12 1550 02/02/12 1150 02/02/12 0828  GLUCAP 163* 210* 217* 212* 172*   A:   DM2. CBG improved.   P:   - continue moderate SSI and Lantus as sugars appropriate for change.  - Continue to hold home Metformin  NEUROLOGIC  A:   Still on Fentanyl and versed for sedation.  Vec off since 7/7. Moves spontaneously  and to painful stimuli. Does not respond to commands P:   - Wean sedation for likely extubation  BEST PRACTICE / DISPOSITION Level of Care:  ICU Primary Service:  PCCM Consultants: none  Code Status:  full Diet:  NPO  until after swallow eval DVT Px:  Sub q hep GI Px:  pepcid Skin Integrity:  normal Social / Family:  Family updated and w/ pt on 7/7  Shelly Flatten, MD Family Medicine PGY-2 02/03/2012, 8:04 AM    Attending Addendum:  I have seen the patient, discussed the issues, test results and plans with Dr Konrad Dolores. I agree with the Assessment and Plans as ammended above.   Levy Pupa, MD, PhD 02/03/2012, 10:45 AM [facility] [phone] or if no answer [phone]

## 2012-02-03 NOTE — Progress Notes (Signed)
Inpatient Diabetes Program Recommendations  AACE/ADA: New Consensus Statement on Inpatient Glycemic Control  Target Ranges:  Prepandial:   less than 140 mg/dL      Peak postprandial:   less than 180 mg/dL (1-2 hours)      Critically ill patients:  140 - 180 mg/dL  Pager:  [phone] Hours:  8 am-10pm   Reason for Visit: Elevated glucose in 200s  Inpatient Diabetes Program Recommendations Insulin - Basal: Increase Lantus to 15 unit qhs    Alfredia Client PhD, RN Diabetes Coordinator  Office:  [phone] Team Pager:  [phone]

## 2012-02-03 NOTE — Progress Notes (Signed)
ANTIBIOTIC CONSULT NOTE - FOLLOW UP  Pharmacy Consult for Primaxin Indication: ? HCAP/sepsis  No Known Allergies  Patient Measurements: Height: 5\' 8"  (172.7 cm) Weight: 214 lb 4.6 oz (97.2 kg) IBW/kg (Calculated) : 63.9   Vital Signs: Temp: 98.2 F (36.8 C) (07/10 0400) Temp src: Oral (07/10 0400) BP: 113/93 mmHg (07/10 1046) Pulse Rate: 112  (07/10 1046) Intake/Output from previous day: 07/09 0701 - 07/10 0700 In: 2751.9 [I.V.:819.9; NG/GT:780; IV Piggyback:902] Out: 3495 [Urine:3245; Stool:250] Intake/Output from this shift: Total I/O In: 53 [I.V.:33; NG/GT:20] Out: 100 [Urine:100]  Labs:  Basename 02/03/12 0500 02/02/12 0830 02/01/12 1830 02/01/12 0400  WBC 12.2* -- -- 13.8*  HGB 9.2* -- -- 8.6*  PLT 410* -- -- 436*  LABCREA -- -- -- --  CREATININE 0.47* 0.51 0.55 --   Estimated Creatinine Clearance: 111.6 ml/min (by C-G formula based on Cr of 0.47). No results found for this basename: VANCOTROUGH:2,VANCOPEAK:2,VANCORANDOM:2,GENTTROUGH:2,GENTPEAK:2,GENTRANDOM:2,TOBRATROUGH:2,TOBRAPEAK:2,TOBRARND:2,AMIKACINPEAK:2,AMIKACINTROU:2,AMIKACIN:2, in the last 72 hours   Assessment: Ms. Bolick continues on empiric Primaxin for ?sepsis vs. HCAP s/p appendectomy at OSH. Pt is afebrile, WBC decr, but still slightly elevated. Scr and UOP are WNL.  vanc 7/2>> 7/9 primaxin 7/2 >> Mycamine 7/5>>7/10  7/5 Rep>>neg 7/4 Bld x2>>NGTD 7/4 MRSA PCR: neg  Plan:  - Continue Primaxin at current dose - Will f/up clinical status along with you daily - Will f/up for Primaxin LOT  Ayaka Andes K. Allena Katz, PharmD, BCPS.  Clinical Pharmacist Pager [phone]. 02/03/2012 10:57 AM

## 2012-02-03 NOTE — Procedures (Signed)
Extubation Procedure Note  Patient Details:   Name: SARIYA TRICKEY DOB: 12-Aug-1970 MRN: [id number]   Airway Documentation:   Pt extubated to 4L [place]. No stridor noted. BBS dim and coarse. Pt able to cough. Pt able to vocalize.   Evaluation  O2 sats: stable throughout and currently acceptable Complications: No apparent complications Patient did tolerate procedure well. Bilateral Breath Sounds: Rhonchi Suctioning: Airway   Christie Beckers 02/03/2012, 10:46 AM

## 2012-02-03 NOTE — Progress Notes (Signed)
Patient ID: SHERIE DOBROWOLSKI, female   DOB: 12-25-1970, 41 y.o.   MRN: [id number] Patient ID: LILYAN PRETE, female   DOB: August 14, 1970, 41 y.o.   MRN: [id number]    Subjective: Remains on ventilator,  Will respond  to voice by opening eyes, will withdraw to painful stimuli.  Objective: Vital signs in last 24 hours: Temp:  [98.2 F (36.8 C)-99.1 F (37.3 C)] 98.2 F (36.8 C) (07/10 0400) Pulse Rate:  [81-119] 101  (07/10 0733) Resp:  [9-30] 21  (07/10 0733) BP: (97-173)/(41-119) 119/51 mmHg (07/10 0733) SpO2:  [93 %-99 %] 98 % (07/10 0733) FiO2 (%):  [29.6 %-98.1 %] 30 % (07/10 0733) Weight:  [214 lb 4.6 oz (97.2 kg)] 214 lb 4.6 oz (97.2 kg) (07/10 0500) Last BM Date: 02/01/12  Intake/Output from previous day: 07/09 0701 - 07/10 0700 In: 2707.9 [I.V.:795.9; NG/GT:760; IV Piggyback:902] Out: 3495 [Urine:3245; Stool:250] Intake/Output this shift:    General appearance: no distress and mildly obese, on ventilator. Abdomen:  all surgical wounds appear to be healing well, no visible drainage from these sites. Abdomen is otherwise soft,slightly distended  with +BS, (250 ml in rectal pouch); BM recorded.  Lab Results:   Basename 02/03/12 0500 02/01/12 0400  WBC 12.2* 13.8*  HGB 9.2* 8.6*  HCT 29.7* 27.0*  PLT 410* 436*   BMET  Basename 02/03/12 0500 02/02/12 0830  NA 144 142  K 3.4* 3.1*  CL 101 98  CO2 34* 36*  GLUCOSE 195* 217*  BUN 29* 35*  CREATININE 0.47* 0.51  CALCIUM 8.6 8.6   PT/INR No results found for this basename: LABPROT:2,INR:2 in the last 72 hours ABG  Basename 02/03/12 0527 02/03/12 0405  PHART 7.544* 7.550*  HCO3 36.7* 36.8*    Studies/Results: Dg Chest Port 1 View  02/02/2012  *RADIOLOGY REPORT*  Clinical Data: Intubated.  ARDS.  COPD.  Tuberculosis. Hypertension.  PORTABLE CHEST - 1 VIEW  Comparison: 1 day prior  Findings: Endotracheal tube terminates at the level of the clavicles and is similar.  Nasogastric extends beyond the  inferior aspect of  the film.  Right IJ central line terminates at high SVC.  Support apparatus projects over the right upper lung.  Borderline cardiomegaly, accentuated by AP portable technique.  Suspect a small left pleural effusion.  Right costophrenic angle is minimally excluded. No pneumothorax.  Improved to resolved interstitial edema.  Improved right base aeration.  Patchy left base air space disease is slightly improved.  IMPRESSION:  1.  Overall improved aeration, with improved to resolved interstitial edema. 2.  Persistent left base atelectasis or infection with small left pleural effusion.  Original Report Authenticated By: Consuello Bossier, M.D.    Anti-infectives: Anti-infectives     Start     Dose/Rate Route Frequency Ordered Stop   01/31/12 0030   vancomycin (VANCOCIN) IVPB 1000 mg/200 mL premix  Status:  Discontinued        1,000 mg 200 mL/hr over 60 Minutes Intravenous Every 12 hours 01/30/12 1947 02/02/12 1101   01/29/12 1400   micafungin (MYCAMINE) 100 mg in sodium chloride 0.9 % 100 mL IVPB        100 mg 100 mL/hr over 1 Hours Intravenous Daily 01/29/12 1338     01/28/12 1200   vancomycin (VANCOCIN) IVPB 1000 mg/200 mL premix  Status:  Discontinued        1,000 mg 200 mL/hr over 60 Minutes Intravenous Every 8 hours 01/28/12 0739 01/30/12 1947   01/28/12 0900  imipenem-cilastatin (PRIMAXIN) 500 mg in sodium chloride 0.9 % 100 mL IVPB        500 mg 200 mL/hr over 30 Minutes Intravenous Every 6 hours 01/28/12 0739            Assessment/Plan: s/p * No surgery found * 1. Continue to follow 2. Mild Hypokalemia 3. Medical management as per CCM   LOS: 6 days    Jacqulyn Barresi 02/03/2012

## 2012-02-03 NOTE — Progress Notes (Signed)
I have seen and examined the patient and agree with the assessment and plans.  Kinaya Hilliker A. Tory Mckissack  MD, FACS  

## 2012-02-03 NOTE — Progress Notes (Signed)
[facility] ADULT ICU REPLACEMENT PROTOCOL FOR AM LAB REPLACEMENT ONLY  The patient does apply for the [facility] Adult ICU Electrolyte Replacment Protocol based on the criteria listed below:   1. Is GFR >/= 50 ml/min? yes  Patient's GFR today is >90 2. Is urine output >/= 0.5 ml/kg/hr for the last 8 hours? yes Patient's UOP is 31 ml/kg/hr 3. Is BUN < 30 mg/dL? yes  Patient's BUN today is 29 4. Abnormal electrolyte(s): Potassium 3.4 5. Ordered repletion with: x2 of potassium per protocol 6. If a panic level lab has been reported, has the CCM MD in charge been notified? no.   Physician:    Thomasenia Bottoms 02/03/2012 6:21 AM

## 2012-02-03 NOTE — Progress Notes (Signed)
Pt complaining of "tightening" chest pain. Called Dr. Vassie Loll, performed an EKG and faxed to [facility] at [facility].

## 2012-02-04 ENCOUNTER — Encounter (HOSPITAL_COMMUNITY): Payer: Self-pay | Admitting: General Practice

## 2012-02-04 ENCOUNTER — Inpatient Hospital Stay (HOSPITAL_COMMUNITY): Payer: Medicaid - Out of State

## 2012-02-04 LAB — BASIC METABOLIC PANEL
BUN: 19 mg/dL (ref 6–23)
Chloride: 102 mEq/L (ref 96–112)
GFR calc Af Amer: >90 mL/min (ref >90)
GFR calc non Af Amer: >90 mL/min (ref >90)
Potassium: 4.3 mEq/L (ref 3.5–5.1)
Sodium: 139 mEq/L (ref 135–145)

## 2012-02-04 LAB — GLUCOSE, CAPILLARY
Glucose-Capillary: 178 mg/dL — ABNORMAL HIGH (ref 70–99)
Glucose-Capillary: 152 mg/dL — ABNORMAL HIGH (ref 70–99)
Glucose-Capillary: 163 mg/dL — ABNORMAL HIGH (ref 70–99)
Glucose-Capillary: 168 mg/dL — ABNORMAL HIGH (ref 70–99)
Glucose-Capillary: 136 mg/dL — ABNORMAL HIGH (ref 70–99)
Glucose-Capillary: 131 mg/dL — ABNORMAL HIGH (ref 70–99)
Glucose-Capillary: 137 mg/dL — ABNORMAL HIGH (ref 70–99)

## 2012-02-04 LAB — ALT: ALT: 11 U/L (ref 0–35)

## 2012-02-04 MED ORDER — BOOST / RESOURCE BREEZE PO LIQD
1.0000 | Freq: Two times a day (BID) | ORAL | Status: DC
  Administered 2012-02-04 – 2012-02-06 (×3): 1 via ORAL

## 2012-02-04 MED ORDER — INSULIN GLARGINE 100 UNIT/ML ~~LOC~~ SOLN
15.0000 [IU] | Freq: Every day | SUBCUTANEOUS | Status: DC
  Administered 2012-02-04: 15 [IU] via SUBCUTANEOUS

## 2012-02-04 MED ORDER — ALBUTEROL SULFATE (5 MG/ML) 0.5% IN NEBU: 2.5000 mg | INHALATION_SOLUTION | RESPIRATORY_TRACT | Status: DC | PRN

## 2012-02-04 MED ORDER — IPRATROPIUM BROMIDE 0.02 % IN SOLN: 0.5000 mg | RESPIRATORY_TRACT | Status: DC | PRN

## 2012-02-04 MED ORDER — HYDROCODONE-ACETAMINOPHEN 5-325 MG PO TABS: 2.0000 | ORAL_TABLET | ORAL | Status: DC | PRN

## 2012-02-04 MED ORDER — HYDROCODONE-ACETAMINOPHEN 5-325 MG PO TABS
1.0000 | ORAL_TABLET | ORAL | Status: DC | PRN
  Administered 2012-02-05 (×2): 2 via ORAL
  Filled 2012-02-04 (×2): qty 2

## 2012-02-04 NOTE — Evaluation (Signed)
Physical Therapy Evaluation Patient Details Name: SAMA ARAUZ MRN: [id number] DOB: [birth year] Today's Date: 02/04/2012 Time: 6440-3474 PT Time Calculation (min): 27 min  PT Assessment / Plan / Recommendation Clinical Impression  Patient s/p VDRF after appendectomy with decr mobility secondary to decr endurance and decr balance after bedrest.  Will benefit from PT to address mobility.  IF sister can provide 24 hour assist, can probably d/c home with her.  If not NHP for therapy recommended.      PT Assessment  Patient needs continued PT services    Follow Up Recommendations  Home health PT;Supervision/Assistance - 24 hour (if sister can provide care)    Barriers to Discharge   If sister can provide 24 hour care, patient can d/c home.      Equipment Recommendations  None recommended by PT    Recommendations for Other Services OT consult   Frequency Min 3X/week    Precautions / Restrictions Precautions Precautions: Fall Restrictions Weight Bearing Restrictions: No   Pertinent Vitals/Pain VSS, Some pain      Mobility  Bed Mobility Bed Mobility: Rolling Left;Left Sidelying to Sit;Sitting - Scoot to [place] of Bed Rolling Left: 4: Min assist;With rail Left Sidelying to Sit: 1: +2 Total assist;With rails;HOB elevated Left Sidelying to Sit: Patient Percentage: 60% Sitting - Scoot to Edge of Bed: 3: Mod assist Details for Bed Mobility Assistance: cues for technique.  Assist to elevate trunk. Transfers Transfers: Sit to Stand;Stand to Sit Sit to Stand: 1: +2 Total assist;From elevated surface;With upper extremity assist;From bed Sit to Stand: Patient Percentage: 50% Stand to Sit: 1: +2 Total assist;With upper extremity assist;To bed Stand to Sit: Patient Percentage: 50% Details for Transfer Assistance: PAtient needed cues for hand placement and technique for sit to stand.  Had difficulty initiating movement and also to complete full stand as patient with flexed posture with  difficulty with trunk and knee extension.  Needed facilitation and cues.  Patient side stepped to her left up to [facility] holding RW to get up in bed to the correct position. with Total assist +2 (pt =65%).   Ambulation/Gait Ambulation/Gait Assistance: Not tested (comment) Stairs: No Wheelchair Mobility Wheelchair Mobility: No         PT Diagnosis: Generalized weakness  PT Problem List: Decreased activity tolerance;Decreased balance;Decreased mobility;Decreased safety awareness;Decreased knowledge of use of DME PT Treatment Interventions: DME instruction;Gait training;Functional mobility training;Therapeutic activities;Stair training;Therapeutic exercise;Balance training;Patient/family education   PT Goals Acute Rehab PT Goals PT Goal Formulation: With patient Time For Goal Achievement: 02/18/12 Potential to Achieve Goals: Good Pt will go Supine/Side to Sit: Independently PT Goal: Supine/Side to Sit - Progress: Goal set today Pt will go Sit to Stand: Independently PT Goal: Sit to Stand - Progress: Goal set today Pt will Ambulate: >150 feet;with supervision;with least restrictive assistive device PT Goal: Ambulate - Progress: Goal set today Pt will Go Up / Down Stairs: 3-5 stairs;with supervision;with least restrictive assistive device PT Goal: Up/Down Stairs - Progress: Goal set today  Visit Information  Last PT Received On: 02/04/12 Assistance Needed: +2    Subjective Data  Subjective: "I live by myself." Patient Stated Goal: To go stay with my sister   Prior Functioning  Home Living Lives With: Significant other (recent split) Available Help at Discharge: Family;Available 24 hours/day (sister) Type of Home: House Home Access: Stairs to enter [employer] of Steps: 1 Home Layout: One level Bathroom Shower/Tub: Health visitor: Standard Home Adaptive Equipment: Bedside commode/3-in-1;Walker - rolling Prior  Function Level of Independence:  Independent Able to Take Stairs?: Yes Driving: Yes Vocation: Unemployed Comments: Trying to get on disability before all this happened Communication Communication: No difficulties Dominant Hand: Left    Cognition  Overall Cognitive Status: Appears within functional limits for tasks assessed/performed Arousal/Alertness: Awake/alert Orientation Level: Appears intact for tasks assessed Behavior During Session: [facility] for tasks performed    Extremity/Trunk Assessment Right Upper Extremity Assessment RUE ROM/Strength/Tone: WFL for tasks assessed RUE Sensation: WFL - Light Touch RUE Coordination: WFL - gross/fine motor Left Upper Extremity Assessment LUE ROM/Strength/Tone: WFL for tasks assessed LUE Sensation: WFL - Light Touch LUE Coordination: WFL - gross/fine motor Right Lower Extremity Assessment RLE ROM/Strength/Tone: WFL for tasks assessed RLE Sensation: WFL - Light Touch RLE Coordination: WFL - gross/fine motor Left Lower Extremity Assessment LLE ROM/Strength/Tone: WFL for tasks assessed LLE Sensation: WFL - Light Touch LLE Coordination: WFL - gross/fine motor Trunk Assessment Trunk Assessment: Normal   Balance Balance Balance Assessed: Yes Static Sitting Balance Static Sitting - Balance Support: Bilateral upper extremity supported;Feet supported Static Sitting - Level of Assistance: 4: Min assist Static Sitting - Comment/# of Minutes: 3  End of Session PT - End of Session Equipment Utilized During Treatment: Gait belt Activity Tolerance: Patient limited by fatigue Patient left: in bed;with call bell/phone within reach (Nursing needed patient back in bed for line placement) Nurse Communication: Mobility status       INGOLD,Faolan [place] 02/04/2012, 12:32 PM  [facility] [phone] (pager)

## 2012-02-04 NOTE — Progress Notes (Signed)
I have seen and examined the patient and agree with the assessment and plans.  Pearlina Friedly A. Delila Kuklinski  MD, FACS  

## 2012-02-04 NOTE — Evaluation (Signed)
Clinical/Bedside Swallow Evaluation Patient Details  Name: KONI KANNAN MRN: [id number] Date of Birth: 1971/04/13  Today's Date: 02/04/2012 Time: 0805-0817 SLP Time Calculation (min): 12 min  Past Medical History:  Past Medical History  Diagnosis Date  . Peripheral vascular disease   . Hypertension   . Anginal pain   . Anxiety   . Depression   . Asthma   . COPD (chronic obstructive pulmonary disease)   . Shortness of breath   . Tuberculosis   . GERD (gastroesophageal reflux disease)   . Headache   . Arthritis   . Diabetes mellitus 01/28/2012   Past Surgical History:  Past Surgical History  Procedure Date  . Diagnostic laparoscopy    HPI:  41 yr old admitted to [facility] hospital 7/1 after ruptured appendix developed respiratory failure , intubated 7/3-7/10.  History of HTN, PVD anginal pain, GERD, COPD.   Assessment / Plan / Recommendation Clinical Impression  Pt. with strong and clear vocal quality during swallow assessment without significant complaints of pharyngeal soreness.  No indications of aspiration, mastication and oral transist WFL's.  Pt. could tolerate a regular diet and thin liquids with current swallowing ability, however NP for surgery present and recommends initiating clear liquids initially.  No ST needed at this time.  Recommend pt.'s diet upgrade to regular texture and thin liquids when pt. able.    Aspiration Risk  Mild    Diet Recommendation Regular;Thin liquid   Liquid Administration via: Cup;Straw Medication Administration: Whole meds with liquid Supervision: Patient able to self feed Compensations: Slow rate;Small sips/bites Postural Changes and/or Swallow Maneuvers: Seated upright 90 degrees;Upright 30-60 min after meal    Other  Recommendations Oral Care Recommendations: Oral care BID   Follow Up Recommendations  None    Frequency and Duration        Pertinent Vitals/Pain       Swallow Study Prior Functional Status       General  HPI: 41 yr old admitted to [facility] hospital 7/1 after ruptured appendix developed respiratory failure , intubated 7/3-7/10.  History of HTN, PVD anginal pain, GERD, COPD. Type of Study: Bedside swallow evaluation Diet Prior to this Study: NPO Temperature Spikes Noted: No Respiratory Status: Supplemental O2 delivered via (comment) History of Recent Intubation: Yes Length of Intubations (days): 8 days Date extubated: 02/03/12 Behavior/Cognition: Alert;Cooperative;Pleasant mood Oral Cavity - Dentition: Adequate natural dentition Self-Feeding Abilities: Able to feed self Patient Positioning: Upright in bed Baseline Vocal Quality: Clear Volitional Cough: Strong Volitional Swallow: Able to elicit    Oral/Motor/Sensory Function Overall Oral Motor/Sensory Function: Appears within functional limits for tasks assessed   Ice Chips Ice chips: Not tested   Thin Liquid Thin Liquid: Within functional limits    Nectar Thick Nectar Thick Liquid: Not tested   Honey Thick Honey Thick Liquid: Not tested   Puree Puree: Within functional limits   Solid Solid: Within functional limits    Royce Macadamia M.Ed [employer] [phone]  02/04/2012

## 2012-02-04 NOTE — Clinical Social Work Psychosocial (Signed)
     Clinical Social Work Department BRIEF PSYCHOSOCIAL ASSESSMENT 02/04/2012  Patient:  [name], [name]     Account Number:  [id number]     Admit date:  01/28/2012  Clinical Social Worker:  Margaree Mackintosh  Date/Time:  02/03/2012 02:00 PM  Referred by:  Physician  Date Referred:  02/03/2012 Referred for  Other - See comment   Other Referral:   Assistance with paperwork   Interview type:  Family Other interview type:   Sister    PSYCHOSOCIAL DATA Living Status:  ALONE Admitted from facility:   Level of care:   Primary support name:  [phone] Primary support relationship to patient:  SIBLING Degree of support available:   Adequate.    CURRENT CONCERNS Current Concerns  Other - See comment   Other Concerns:    SOCIAL WORK ASSESSMENT / PLAN Clinical Social Worker recieved referral from MD indicating that family requested assistance with paperwork.  CSW reviewed chart and met with sister at bedside, pt recently extubated and not able to fully participate in assessment due to medical condition.  CSW introduced self, explained role, and provided support.  Sister requested assistance with paperwork.  CSW reviewed paperwork and noted it was a request for medical records.  CSW explained HIPPA and process for obtaining medical records.  CSW walked sister to medical records where they could review request.  No other CSW needs identified at this time.  CSW to sign off, please re consult if needed.   Assessment/plan status:  No Further Intervention Required Other assessment/ plan:   Information/referral to community resources:   Medical Records.    PATIENTS/FAMILYS RESPONSE TO PLAN OF CARE: Pt was unable to fully participate in assessment due to medical condition.  Sister was pleasant and appropriately concerned.  Sister thanked CSW for intervention.

## 2012-02-04 NOTE — Progress Notes (Addendum)
Nutrition Follow-up  Intervention:    Boost Breeze BID to maximize oral intake.  Discontinue Pro-stat.  Assessment:   Patient was extubated yesterday morning.  S/P swallow evaluation with SLP this morning; recommended regular diet with thin liquids.  TF was discontinued with extubation.  Patient reports poor intake PTA, but seemed a little confused during RD visit.  Diet Order:  Clear Liquids, advancing to CHO-Modified Medium today  Meds: Scheduled Meds:   . albuterol  2.5 mg Nebulization Q4H  . albuterol      . antiseptic oral rinse  15 mL Mouth Rinse QID  . chlorhexidine  15 mL Mouth Rinse BID  . famotidine (PEPCID) IV  20 mg Intravenous Q12H  . feeding supplement  30 mL Per Tube Daily  . feeding supplement  60 mL Per Tube TID  . heparin  5,000 Units Subcutaneous Q8H  . imipenem-cilastatin  500 mg Intravenous Q6H  . insulin aspart  0-9 Units Subcutaneous Q4H  . insulin glargine  15 Units Subcutaneous QHS  . ipratropium      . ipratropium  0.5 mg Nebulization Q4H  . multivitamin  5 mL Per Tube Daily  . potassium chloride  10 mEq Intravenous Q1 Hr x 2  . potassium chloride  40 mEq Per Tube Q4H  . potassium chloride      . SAILS Study - rosuvastatin / placebo (PI-Wright)  20 mg Oral Daily  . DISCONTD: albuterol-ipratropium  2 puff Inhalation Q4H  . DISCONTD: albuterol-ipratropium  6 puff Inhalation Q4H  . DISCONTD: famotidine  20 mg Per Tube BID  . DISCONTD: feeding supplement (OXEPA)  1,000 mL Per Tube Q24H  . DISCONTD: insulin glargine  10 Units Subcutaneous QHS  . DISCONTD: micafungin (MYCAMINE) IV  100 mg Intravenous Daily  . DISCONTD: potassium chloride  10 mEq Intravenous Q1H   Continuous Infusions:   . sodium chloride 500 mL (02/03/12 2035)  . dextrose    . DISCONTD: fentaNYL infusion INTRAVENOUS Stopped (02/03/12 0900)  . DISCONTD: midazolam (VERSED) infusion Stopped (02/03/12 0900)  . DISCONTD: norepinephrine (LEVOPHED) Adult infusion 1.333 mcg/min (01/31/12  0015)   PRN Meds:.acetaminophen (TYLENOL) oral liquid 160 mg/5 mL, dextrose, HYDROcodone-acetaminophen, DISCONTD: fentaNYL, DISCONTD: fentaNYL, DISCONTD: furosemide, DISCONTD: HYDROcodone-acetaminophen, DISCONTD: midazolam  Labs:  CMP     Component Value Date/Time   NA 139 02/04/2012 0400   K 4.3 02/04/2012 0400   CL 102 02/04/2012 0400   CO2 29 02/04/2012 0400   GLUCOSE 168* 02/04/2012 0400   BUN 19 02/04/2012 0400   CREATININE 0.52 02/04/2012 0400   CALCIUM 8.6 02/04/2012 0400   PROT 5.7* 01/29/2012 0500   ALBUMIN 2.0* 01/29/2012 0500   AST 20 02/04/2012 0400   ALT 11 02/04/2012 0400   ALKPHOS 91 01/29/2012 0500   BILITOT 0.1* 01/29/2012 0500   GFRNONAA >90 02/04/2012 0400   GFRAA >90 02/04/2012 0400     Intake/Output Summary (Last 24 hours) at 02/04/12 0937 Last data filed at 02/04/12 0900  Gross per 24 hour  Intake   1090 ml  Output   2345 ml  Net  -1255 ml    Weight Status:  97.2 kg stable since admission  Re-estimated needs:  1800-2000 kcals, 95-105 grams protein daily  Nutrition Dx:  Inadequate oral intake, ongoing, now related to recent intubation and possible altered GI function as evidenced by clear liquid diet.  Goal:  Enteral nutrition to provide 60-70% of estimated calorie needs (22-25 kcals/kg ideal body weight) and 100% of estimated protein needs, based  on ASPEN guidelines for permissive underfeeding in critically ill obese individuals, no longer applicable.  New Goal:  Intake to meet >90% of estimated nutrition needs.  Monitor:  PO intake as diet is advanced, weight trend.   Joaquin Courts, RD, CNSC, LDN Pager# [phone] After Hours Pager# [phone]

## 2012-02-04 NOTE — Progress Notes (Signed)
Name: [name] MRN: [id number] DOB: 1970-10-30    LOS: 7  Referring Provider:  [facility]  Reason for Referral:  Acute respiratory failure   PULMONARY / CRITICAL CARE MEDICINE  Brief patient description:  41 y/o female with DM2, who developed ARDS on 7/3 after a lap appy on 7/1 at [facility].  She was transferred to [facility] on 01/28/2012.  Events Since Admission: 7/3 CT Ab>> bibasilar airspace disease, fluid near appy site and drainage catheter, unremarkable small bowel, stomach, colon.   7/4 L Radial Art Line>>7/5 7/5 Paralytics>>>7/7 JP drain out on 7/9  Current Status:  Extubated. Bedside swallow pending. Feels significantly better today.   Vital Signs: Temp:  [98.2 F (36.8 C)-99.6 F (37.6 C)] 98.4 F (36.9 C) (07/11 0400) Pulse Rate:  [61-112] 61  (07/11 0600) Resp:  [9-24] 11  (07/11 0600) BP: (88-126)/(46-93) 91/48 mmHg (07/11 0600) SpO2:  [94 %-99 %] 97 % (07/11 0600) FiO2 (%):  [30 %-30.2 %] 30.2 % (07/10 1000) Weight:  [214 lb 4.6 oz (97.2 kg)] 214 lb 4.6 oz (97.2 kg) (07/11 0500)  Intake/Output Summary (Last 24 hours) at 02/04/12 0636 Last data filed at 02/04/12 0600  Gross per 24 hour  Intake   1087 ml  Output   2295 ml  Net  -1208 ml    Physical Examination: Gen: obese, NAD PULM: CTAB, on 4L [place] CV: RRR, no mgr, no JVD AB: BS present, soft, Ext: warm, trace edema, no clubbing, no cyanosis Derm: no rash or skin breakdown Neuro: AAOx3  Principal Problem:  *Acute respiratory failure with hypoxia Active Problems:  Appendicitis  ARDS (adult respiratory distress syndrome)  Diabetes mellitus  Asthma  Hypertension   ASSESSMENT AND PLAN  PULMONARY  Lab 02/03/12 0527 02/03/12 0405 02/02/12 0902 02/01/12 0901 01/31/12 0419  PHART 7.544* 7.550* 7.533* 7.434 7.399  PCO2ART 42.6 42.0 48.3* 49.7* 46.6*  PO2ART 73.0* 67.0* 62.0* 103.0* 69.9*  HCO3 36.7* 36.8* 40.6* 33.4* 28.3*  O2SAT 96.0 95.0 93.0 98.0 94.5   Ventilator  Settings: None   CXR: 7/11. Mild central pulmonary vascular prominence without frank pulmonary edema.  Removal of endotracheal tube and nasogastric tube.  Mild prominence aortic knob region. Attention to this on follow-up.   ETT:  7/3 >>7/10  A:   ARDS likely due to HCAP vs. Sepsis from appendicitis; Asthma, with unlikely exacerbation. Paralysis started 7/5 for severe ARDS and DC on 7/7. Overall improved today w/ significant diuresis, and extubation. Net down 6.1L since admission  P:   - see ID for antibiotics - Continue supplemental O2 PRN. Wean as tolerated  - Continue bronchodilators - change to prn - Steroids off since 7/7 - pcxr daily   CARDIOVASCULAR  Lab 01/28/12 1322 01/28/12 0647 01/28/12 0644  TROPONINI <0.30 -- <0.30  LATICACIDVEN -- 0.7 --  PROBNP -- -- 1340.0*   ECG:  pending Lines: 7/4 R IJ>>> Echo: 7/5 Normal anatomy and function. EF of 65-70%  A:  septic shock at outside hospital, worsened periodically with air trapping -resolving. Levo off on 7/7. FLuid status down 6L since admission. MAP of 60-68 in 24hrs  P:  - Restart home BP meds as BP returns to baseline   RENAL  Lab 02/04/12 0400 02/03/12 0500 02/02/12 0830 02/01/12 1830 02/01/12 0400 01/29/12 0500  NA 139 144 142 143 144 --  K 4.3 3.4* -- -- -- --  CL 102 101 98 99 105 --  CO2 29 34* 36* 37* 32 --  BUN 19  29* 35* 33* 33* --  CREATININE 0.52 0.47* 0.51 0.55 0.54 --  CALCIUM 8.6 8.6 8.6 8.7 8.8 --  MG 2.1 -- -- -- 1.9 2.0  PHOS -- -- -- -- -- 3.3   Intake/Output      07/10 0701 - 07/11 0700   I.V. (mL/kg) 453 (4.7)   NG/GT 40   IV Piggyback 550   Total Intake(mL/kg) 1043 (10.7)   Urine (mL/kg/hr) 2295 (1)   Total Output 2295   Net -1252        Foley:  7/4>>>7/11  A:   No acute issues.  UOP of 2.3L in 24hrs. Lasix DC on 7/10. K repleted and normal.   P:   - BMET in am  - follow uop - No further K replacement - DC Foley today   GASTROINTESTINAL  Lab 02/04/12 0400 02/01/12  0400 01/30/12 0400 01/29/12 0500 01/28/12 0644  AST 20 17 27 18 19   ALT 11 16 13 10 12   ALKPHOS -- -- -- 91 112  BILITOT -- -- -- 0.1* 0.1*  PROT -- -- -- 5.7* 5.2*  ALBUMIN -- -- -- 2.0* 1.8*    A:   S/p Appy protein calorie malnutrition.  JP drain out 7/9. Mag normal on 7/11  P:   - swallow eval for Po intake deferred to today. - Advance diet to carb mod today. Start w/ clear liquid diet per Surgery - see ID for ABX - DC rectal tube today   HEMATOLOGIC  Lab 02/03/12 0500 02/01/12 0400 01/31/12 0430 01/29/12 1335 01/29/12 0500 01/28/12 1024  HGB 9.2* 8.6* 9.3* 8.7* 9.3* --  HCT 29.7* 27.0* 29.3* 27.4* 29.9* --  PLT 410* 436* 404* 368 405* --  INR -- -- -- -- -- 1.23  APTT -- -- -- -- -- --   A:  No acute issues. Anemic, but unsure of baseline. Improved on 7/10 to 9.2 from 8.6 on 7/9.  P:  - follow cbc - hep sub q  INFECTIOUS  Lab 02/03/12 0500 02/01/12 0400 01/31/12 0430 01/29/12 1335 01/29/12 0500 01/28/12 0644  WBC 12.2* 13.8* 8.9 14.2* 20.2* --  PROCALCITON -- -- -- -- -- 1.10   Cultures: 7/5 resp >>neg 7/2 blood >> No Growth as of 7/11  Antibiotics: 7/2 Vanc HCAP >> 7/9 7/2 Imipenim (HCAP/Appendicitis) >> 7/11  7/5  Micafungin>>> 7/10  A:  HCAP (unlikely at this point given improved CXR) and appendicitis.  Cx still negative. DC Vanc 7/9. DC Micofungin on 7/10.  P:   - DC imipenem 7/11 - follow blood and resp cultures likely to remain negative  - Appreciate surgery recs - abx / cultures as above  ENDOCRINE  Lab 02/03/12 2334 02/03/12 2239 02/03/12 1948 02/03/12 1617 02/03/12 1203  GLUCAP 153* 133* 172* 203* 263*   A:   DM2. Some elevated sugars, this should increase as pt starts taking PO.   P:   - continue moderate SSI and Lantus as sugars appropriate for change. Increase Lantus to 15.  - Continue to hold home Metformin  NEUROLOGIC  A:   Awake and alert. Pain well controlled.    Vec off since 7/7.  P:   - Pain well controlled. - DC  Fentanyl and start Norco  - PT/OT starting 7/11  BEST PRACTICE / DISPOSITION Level of Care:  ICU >> to floor bed Primary Service:  PCCM >> to [facility] service Consultants: none  Code Status:  full Diet:  NPO until after swallow eval DVT Px:  Sub q hep GI Px:  pepcid Skin Integrity:  normal Social / Family:  Family updated and w/ pt on 7/7  Shelly Flatten, MD Family Medicine PGY-2 02/04/2012, 6:36 AM  Levy Pupa, MD, PhD 02/04/2012, 11:10 AM [facility] [phone] or if no answer [phone]

## 2012-02-04 NOTE — Progress Notes (Signed)
Patient ID: [name], female   DOB: 1971-06-14, 41 y.o.   MRN: [id number] Patient ID: [name], female   DOB: 10-16-1970, 41 y.o.   MRN: [id number] Patient ID: [name], female   DOB: 1970/09/11, 41 y.o.   MRN: [id number]    Subjective: Extubated, awake, alert this am. Speech doing swallowing eval.  Objective: Vital signs in last 24 hours: Temp:  [98.2 F (36.8 C)-99.6 F (37.6 C)] 98.4 F (36.9 C) (07/11 0400) Pulse Rate:  [61-112] 65  (07/11 0700) Resp:  [9-16] 12  (07/11 0700) BP: (88-126)/(46-93) 105/54 mmHg (07/11 0700) SpO2:  [94 %-99 %] 98 % (07/11 0700) FiO2 (%):  [30 %-30.2 %] 30.2 % (07/10 1000) Weight:  [214 lb 4.6 oz (97.2 kg)] 214 lb 4.6 oz (97.2 kg) (07/11 0500) Last BM Date: 02/01/12  Intake/Output from previous day: 07/10 0701 - 07/11 0700 In: 1063 [I.V.:473; NG/GT:40; IV Piggyback:550] Out: 2295 [Urine:2295] Intake/Output this shift:    General appearance: no distress and mildly obese,awake , alert.  Abdomen:  all surgical wounds appear to be healing well, no visible drainage from these sites. Abdomen is otherwise soft,slightly distended  with +BS,no BM recorded today rectal pouch removed.  Lab Results:   Basename 02/03/12 0500  WBC 12.2*  HGB 9.2*  HCT 29.7*  PLT 410*   BMET  Basename 02/04/12 0400 02/03/12 0500  NA 139 144  K 4.3 3.4*  CL 102 101  CO2 29 34*  GLUCOSE 168* 195*  BUN 19 29*  CREATININE 0.52 0.47*  CALCIUM 8.6 8.6   PT/INR No results found for this basename: LABPROT:2,INR:2 in the last 72 hours ABG  Basename 02/03/12 0527 02/03/12 0405  PHART 7.544* 7.550*  HCO3 36.7* 36.8*    Studies/Results: Dg Chest Port 1 View  02/04/2012  *RADIOLOGY REPORT*  Clinical Data: ARDS. Shortness of breath.  PORTABLE CHEST - 1 VIEW  Comparison: 02/03/2012.  Findings: Endotracheal tube and nasogastric tube have been removed. Right central line unchanged in position at the expected level of the right internal jugular vein /  brachiocephalic vein junction. No gross pneumothorax.  Heart slightly prominent in size.  Mild pulmonary vascular prominence.  Mild prominence aortic knob region.  Attention to this on follow- up.  IMPRESSION: Mild central pulmonary vascular prominence without frank pulmonary edema.  Removal of endotracheal tube and nasogastric tube.  Mild prominence aortic knob region.  Attention to this on follow- up.  Original Report Authenticated By: Fuller Canada, M.D.   Dg Chest Port 1 View  02/03/2012  *RADIOLOGY REPORT*  Clinical Data: Intubated patient.  PORTABLE CHEST - 1 VIEW  Comparison: Chest 02/02/2012.  Findings: Endotracheal tube, NG tube and right IJ catheter unchanged.  Basilar airspace disease, worse on the left, persists. No pneumothorax.  Heart size normal.  IMPRESSION: No interval change.  Original Report Authenticated By: Bernadene Bell. Maricela Curet, M.D.    Anti-infectives: Anti-infectives     Start     Dose/Rate Route Frequency Ordered Stop   01/31/12 0030   vancomycin (VANCOCIN) IVPB 1000 mg/200 mL premix  Status:  Discontinued        1,000 mg 200 mL/hr over 60 Minutes Intravenous Every 12 hours 01/30/12 1947 02/02/12 1101   01/29/12 1400   micafungin (MYCAMINE) 100 mg in sodium chloride 0.9 % 100 mL IVPB  Status:  Discontinued        100 mg 100 mL/hr over 1 Hours Intravenous Daily 01/29/12 1338 02/03/12 1045   01/28/12 1200  vancomycin (VANCOCIN) IVPB 1000 mg/200 mL premix  Status:  Discontinued        1,000 mg 200 mL/hr over 60 Minutes Intravenous Every 8 hours 01/28/12 0739 01/30/12 1947   01/28/12 0900   imipenem-cilastatin (PRIMAXIN) 500 mg in sodium chloride 0.9 % 100 mL IVPB        500 mg 200 mL/hr over 30 Minutes Intravenous Every 6 hours 01/28/12 0739            Assessment/Plan: s/p * No surgery found * 1. Continue to follow 2. Hypokalemia (resolved) 3. Advance diet to clear liquids (patient cleared by speech) discussed with CCM we will advance diet as tolerated. 4.  PT/OT 5. Transfer to medsurg/step down soon? 6. Medical management as per CCM   LOS: 7 days    Chevy Virgo 02/04/2012

## 2012-02-05 ENCOUNTER — Inpatient Hospital Stay (HOSPITAL_COMMUNITY): Payer: Medicaid - Out of State

## 2012-02-05 ENCOUNTER — Encounter (HOSPITAL_COMMUNITY): Payer: Self-pay | Admitting: Internal Medicine

## 2012-02-05 DIAGNOSIS — A419 Sepsis, unspecified organism: Secondary | ICD-10-CM | POA: Diagnosis present

## 2012-02-05 DIAGNOSIS — D649 Anemia, unspecified: Secondary | ICD-10-CM

## 2012-02-05 DIAGNOSIS — E876 Hypokalemia: Secondary | ICD-10-CM | POA: Diagnosis present

## 2012-02-05 HISTORY — DX: Anemia, unspecified: D64.9

## 2012-02-05 LAB — GLUCOSE, CAPILLARY: Glucose-Capillary: 101 mg/dL — ABNORMAL HIGH (ref 70–99)

## 2012-02-05 MED ORDER — METFORMIN HCL 500 MG PO TABS
1000.0000 mg | ORAL_TABLET | Freq: Two times a day (BID) | ORAL | Status: DC
  Administered 2012-02-05 – 2012-02-06 (×3): 1000 mg via ORAL
  Filled 2012-02-05 (×5): qty 2

## 2012-02-05 MED ORDER — ADULT MULTIVITAMIN W/MINERALS CH
1.0000 | ORAL_TABLET | Freq: Every day | ORAL | Status: DC
  Administered 2012-02-06: 1 via ORAL
  Filled 2012-02-05: qty 1

## 2012-02-05 MED ORDER — INSULIN ASPART 100 UNIT/ML ~~LOC~~ SOLN: 0.0000 [IU] | Freq: Three times a day (TID) | SUBCUTANEOUS | Status: DC

## 2012-02-05 MED ORDER — CARVEDILOL 3.125 MG PO TABS
3.1250 mg | ORAL_TABLET | Freq: Two times a day (BID) | ORAL | Status: DC
  Administered 2012-02-05 – 2012-02-06 (×3): 3.125 mg via ORAL
  Filled 2012-02-05 (×5): qty 1

## 2012-02-05 MED ORDER — FAMOTIDINE 20 MG PO TABS
20.0000 mg | ORAL_TABLET | Freq: Every day | ORAL | Status: DC
  Administered 2012-02-05 – 2012-02-06 (×2): 20 mg via ORAL
  Filled 2012-02-05 (×2): qty 1

## 2012-02-05 MED ORDER — ONDANSETRON HCL 4 MG/2ML IJ SOLN
4.0000 mg | Freq: Four times a day (QID) | INTRAMUSCULAR | Status: DC | PRN
  Administered 2012-02-05: 4 mg via INTRAVENOUS
  Filled 2012-02-05: qty 2

## 2012-02-05 MED ORDER — METFORMIN HCL 500 MG PO TABS: 1000.0000 mg | ORAL_TABLET | Freq: Two times a day (BID) | ORAL | Status: DC

## 2012-02-05 MED ORDER — INSULIN GLARGINE 100 UNIT/ML ~~LOC~~ SOLN
5.0000 [IU] | Freq: Every day | SUBCUTANEOUS | Status: DC
  Administered 2012-02-05: 5 [IU] via SUBCUTANEOUS

## 2012-02-05 NOTE — Significant Event (Signed)
Pt c/o nausea.  Will give zofran IV prn and monitor.  Coralyn Helling, MD 02/05/2012, 2:02 AM

## 2012-02-05 NOTE — Research (Signed)
PCCM Research  I have seen and examined this pt previously on 01/28/12 and agree she is an excellent candidate for the [employer] SAILS trial.  Austin Miles  [phone]  Cell  [phone]  If no response or cell goes to voicemail, call beeper [phone]

## 2012-02-05 NOTE — Progress Notes (Signed)
I have seen and examined the patient and agree with the assessment and plans.  No further general surgical issues at this point.  Will sign off.  Call us if needed  Hagen Tidd A. Magnus Ivan  MD, FACS

## 2012-02-05 NOTE — Research (Signed)
SAILS clinical research study explained to patient.  Consent to continue in the study was obtained.  All questions and concerns were answered and addressed prior to obtaining continued consent.  The patient expressed understanding of the study.  A signed copy of the consent was provided to the patient.  Please contact CCM physician or study coordinator at [phone] for any questions or concerns.

## 2012-02-05 NOTE — Progress Notes (Signed)
[facility] PROGRESS NOTE  PEARSON REASONS ONG:[id number] DOB: Apr 02, 1971 DOA: 01/28/2012   Assessment/Plan: Patient Active Hospital Problem List: Acute respiratory failure with hypoxia (01/28/2012) 2/2 ARDS after rupture apendin -resolved, ETT: 7/3 >>7/10-Levo off on 7/7.  Septic shock: -Now resolved -septic shock at outside hospital,  Levo off on 7/7. FLuid status down 6L since admission. Good UOP. BP stable SB > 100's -now on imipenem and micafungin.  Ruptured Appendicitis (01/28/2012) -outside hospital, continue antibiotic imipenem and micafungin. -? To surgery duration of antibiotics. ? changed to PO. ? Follow ups. -advance diet as tolerated  Diabetes mellitus (01/28/2012) -good controlled. -on lantus plus SSI. Restart metformin  Asthma (01/28/2012) -Stable  Hypertension (01/28/2012) -good controlled. -resume coreg.  Anemia: -menstruating female, Hbg stable.  Code Status: full code Family Communication: patient  Disposition Plan: inpatient     LOS: 8 days   Procedures: Lines: 7/4 R IJ>>> JP today 7/3>> 7/9 7/4 L Radial Art Line>>7/5 ETT 7/3>>7/10  Antibiotics: 7/2 Vanc HCAP >> 7/9  7/2 Imipenim (HCAP/Appendicitis) >>  7/5 Micafungin>>> 7/10   Interim History: 42 y/o female with DM2, who developed ARDS on 7/3 after a lap appy on 7/1 at [facility]. She was transferred to [facility] on 01/28/2012. Intubated on 7/3 and required pressor. Extubated on 7/10 and transfer to the floor on 7/11.   Subjective: Feels tired. She relates she is not hungry  Objective: Filed Vitals:   02/04/12 1200 02/04/12 1300 02/04/12 2111 02/05/12 0522  BP: 92/54 88/51 115/73 100/62  Pulse: 63 62 86 71  Temp:   98.7 F (37.1 C) 97.5 F (36.4 C)  TempSrc:   Oral Oral  Resp: 14 13 16 18   Height:      Weight:      SpO2: 96% 96% 95% 92%    Intake/Output Summary (Last 24 hours) at 02/05/12 0813 Last data filed at 02/05/12 4401  Gross per 24 hour  Intake 582.84 ml  Output     250 ml  Net 332.84 ml   Weight change:   Exam:  General: Alert, awake, oriented x3, in no acute distress.  HEENT: No bruits, no goiter.  Heart: Regular rate and rhythm, without murmurs, rubs, gallops.  Lungs: Good air movement, bilateral air movement.  Abdomen: Soft, nontender, nondistended, positive bowel sounds.  Neuro: Grossly intact, nonfocal.   Data Reviewed: Basic Metabolic Panel:  Lab 02/04/12 0272 02/03/12 0500 02/02/12 0830 02/01/12 1830 02/01/12 0400  NA 139 144 142 143 144  K 4.3 3.4* -- -- --  CL 102 101 98 99 105  CO2 29 34* 36* 37* 32  GLUCOSE 168* 195* 217* 194* 194*  BUN 19 29* 35* 33* 33*  CREATININE 0.52 0.47* 0.51 0.55 0.54  CALCIUM 8.6 8.6 8.6 8.7 8.8  MG 2.1 -- -- -- 1.9  PHOS -- -- -- -- --   Liver Function Tests:  Lab 02/04/12 0400 02/01/12 0400 01/30/12 0400  AST 20 17 27   ALT 11 16 13   ALKPHOS -- -- --  BILITOT -- -- --  PROT -- -- --  ALBUMIN -- -- --   No results found for this basename: LIPASE:5,AMYLASE:5 in the last 168 hours No results found for this basename: AMMONIA:5 in the last 168 hours CBC:  Lab 02/03/12 0500 02/01/12 0400 01/31/12 0430 01/29/12 1335  WBC 12.2* 13.8* 8.9 14.2*  NEUTROABS -- -- -- --  HGB 9.2* 8.6* 9.3* 8.7*  HCT 29.7* 27.0* 29.3* 27.4*  MCV 101.7* 100.0 99.0 100.0  PLT  410* 436* 404* 368   Cardiac Enzymes:  Lab 02/04/12 0400 02/01/12 0400 01/30/12 0400  CKTOTAL 54 10 11  CKMB -- -- --  CKMBINDEX -- -- --  TROPONINI -- -- --   BNP: No components found with this basename: POCBNP:5 CBG:  Lab 02/05/12 0356 02/04/12 2341 02/04/12 2218 02/04/12 2104 02/04/12 1703  GLUCAP 126* 133* 137* 131* 136*    Recent Results (from the past 240 hour(s))  MRSA PCR SCREENING     Status: Normal   Collection Time   01/28/12  5:36 AM      Component Value Range Status Comment   MRSA by PCR NEGATIVE  NEGATIVE Final   CULTURE, BLOOD (ROUTINE X 2)     Status: Normal   Collection Time   01/28/12  8:30 AM      Component  Value Range Status Comment   Specimen Description BLOOD LEFT ARM   Final    Special Requests BOTTLES DRAWN AEROBIC AND ANAEROBIC 10CC   Final    Culture  Setup Time 01/28/2012 16:55   Final    Culture NO GROWTH 5 DAYS   Final    Report Status 02/03/2012 FINAL   Final   CULTURE, BLOOD (ROUTINE X 2)     Status: Normal   Collection Time   01/28/12  8:45 AM      Component Value Range Status Comment   Specimen Description BLOOD LEFT ARM   Final    Special Requests BOTTLES DRAWN AEROBIC AND ANAEROBIC 10CC   Final    Culture  Setup Time 01/28/2012 16:55   Final    Culture NO GROWTH 5 DAYS   Final    Report Status 02/03/2012 FINAL   Final   CULTURE, RESPIRATORY     Status: Normal   Collection Time   01/29/12 11:09 AM      Component Value Range Status Comment   Specimen Description TRACHEAL ASPIRATE   Final    Special Requests NONE   Final    Gram Stain     Final    Value: FEW WBC PRESENT,BOTH PMN AND MONONUCLEAR     FEW SQUAMOUS EPITHELIAL CELLS PRESENT     NO ORGANISMS SEEN   Culture NO GROWTH 2 DAYS   Final    Report Status 01/31/2012 FINAL   Final      Studies: Ct Abdomen Pelvis W Contrast  01/28/2012  *RADIOLOGY REPORT*  Clinical Data: Status post appendectomy for perforated appendix 01/25/2012.  Drain in place.  Respiratory distress and sepsis. Question abscess.  CT ABDOMEN AND PELVIS WITH CONTRAST  Technique:  Multidetector CT imaging of the abdomen and pelvis was performed following the standard protocol during bolus administration of intravenous contrast.  Contrast: OMNIPAQUE IOHEXOL 300 MG/ML  SOLN  Comparison: CT abdomen and pelvis 01/27/2012.  Findings: Small bilateral pleural effusions are again seen. Extensive bilateral airspace disease in the lung bases persists.  Surgical drain remains in place in the right lower quadrant.  The drain does not localize a fluid collection.  There is a persistent fluid collection in the cul-de-sac measuring 4.6 cm transverse by 2.7 cm AP by 3.7 cm  cranial-caudal compared to 5.0 cm transverse by 3.4 cm AP by 4.4 cm cranial-caudal on the prior study.  No new fluid collection is identified.  The patient is status post cholecystectomy.  The liver, spleen, adrenal glands, pancreas and kidneys are unremarkable.  Uterus and adnexa are unremarkable.  Foley catheter is in place in a decompressed  urinary bladder.  The stomach and small and large bowel appear normal.  There is no focal bony abnormality.  IMPRESSION:  1.  Slight decrease in the size of a small fluid collection in the cul-de-sac of the pelvis. 2.  Surgical drain remains in place in the right lower quadrant. The drain does not localize a fluid collection. 3.  No marked change in extensive bibasilar airspace disease and small bilateral pleural effusions.  Original Report Authenticated By: Bernadene Bell. Maricela Curet, M.D.   Dg Chest Port 1 View  02/04/2012  *RADIOLOGY REPORT*  Clinical Data: ARDS. Shortness of breath.  PORTABLE CHEST - 1 VIEW  Comparison: 02/03/2012.  Findings: Endotracheal tube and nasogastric tube have been removed. Right central line unchanged in position at the expected level of the right internal jugular vein / brachiocephalic vein junction. No gross pneumothorax.  Heart slightly prominent in size.  Mild pulmonary vascular prominence.  Mild prominence aortic knob region.  Attention to this on follow- up.  IMPRESSION: Mild central pulmonary vascular prominence without frank pulmonary edema.  Removal of endotracheal tube and nasogastric tube.  Mild prominence aortic knob region.  Attention to this on follow- up.  Original Report Authenticated By: Fuller Canada, M.D.   Dg Chest Port 1 View  02/03/2012  *RADIOLOGY REPORT*  Clinical Data: Intubated patient.  PORTABLE CHEST - 1 VIEW  Comparison: Chest 02/02/2012.  Findings: Endotracheal tube, NG tube and right IJ catheter unchanged.  Basilar airspace disease, worse on the left, persists. No pneumothorax.  Heart size normal.  IMPRESSION: No  interval change.  Original Report Authenticated By: Bernadene Bell. Maricela Curet, M.D.   Dg Chest Port 1 View  02/02/2012  *RADIOLOGY REPORT*  Clinical Data: Intubated.  ARDS.  COPD.  Tuberculosis. Hypertension.  PORTABLE CHEST - 1 VIEW  Comparison: 1 day prior  Findings: Endotracheal tube terminates at the level of the clavicles and is similar.  Nasogastric extends beyond the  inferior aspect of the film.  Right IJ central line terminates at high SVC.  Support apparatus projects over the right upper lung.  Borderline cardiomegaly, accentuated by AP portable technique.  Suspect a small left pleural effusion.  Right costophrenic angle is minimally excluded. No pneumothorax.  Improved to resolved interstitial edema.  Improved right base aeration.  Patchy left base air space disease is slightly improved.  IMPRESSION:  1.  Overall improved aeration, with improved to resolved interstitial edema. 2.  Persistent left base atelectasis or infection with small left pleural effusion.  Original Report Authenticated By: Consuello Bossier, M.D.   Dg Chest Port 1 View  02/01/2012  *RADIOLOGY REPORT*  Clinical Data: Evaluate endotracheal tube.  PORTABLE CHEST - 1 VIEW  Comparison: 01/31/2012.  Findings: Rotation to the left.  Endotracheal tube tip 3.5 cm above the carina.  Right central line tip at the expected level of the proximal superior vena cava. Nasogastric tube courses below the diaphragm.  The tip is not included on this exam.  Consolidation left base may represent infiltrate and / or atelectasis superimposed upon the pulmonary edema.  Heart size top normal slightly enlarged.  Aorta incompletely assessed.  IMPRESSION: Endotracheal tube tip 3.5 cm above the carina.  Consolidation left base may represent infiltrate and / or atelectasis superimposed upon the pulmonary edema.  Original Report Authenticated By: Fuller Canada, M.D.   Dg Chest Port 1 View  01/31/2012  *RADIOLOGY REPORT*  Clinical Data: Ventilator dependent  respiratory failure.  Follow up pulmonary edema/ARDS and left lower lobe atelectasis/pneumonia.  PORTABLE CHEST - 1 VIEW 01/31/2012 0547 hours:  Comparison: Portable chest x-rays yesterday and dating back to 01/27/2011.  Findings: Endotracheal tube tip in satisfactory position approximately 7 cm above the carina.  Right jugular central venous catheter tip in the SVC.  Nasogastric courses below the diaphragm into the stomach.  Cardiac silhouette enlarged but stable.  Stable dense left lower lobe consolidation with air bronchograms. Resolution of pulmonary edema with pulmonary venous hypertension persisting.  Stable mild atelectasis at the right lung base.  No new pulmonary parenchymal abnormalities.  IMPRESSION: Support apparatus satisfactory.  Stable dense left lower lobe pneumonia and mild right basilar atelectasis.  Resolution of interstitial pulmonary edema with pulmonary venous hypertension persisting.  No new abnormalities.  Original Report Authenticated By: Arnell Sieving, M.D.   Dg Chest Port 1 View  01/30/2012  *RADIOLOGY REPORT*  Clinical Data: Ventilator dependent respiratory failure.  Follow up edema and atelectasis versus pneumonia.  PORTABLE CHEST - 1 VIEW 01/30/2012 1153 hours:  Comparison: Portable chest x-ray earlier same date 0539 hours and dating back to 01/27/2012.  Findings: Endotracheal tube tip in satisfactory position approximately 5 cm above the carina.  Right jugular central venous catheter tip in the upper SVC.  Nasogastric tube courses below the diaphragm into the stomach.  Cardiac silhouette enlarged but stable.  Interval improvement in the interstitial and airspace pulmonary edema since earlier in the day, with only minimal interstitial edema persisting.  Improved aeration in the lung bases, though moderate consolidation persists, left greater than right.  No new pulmonary parenchymal abnormalities.  IMPRESSION: Support apparatus satisfactory.  Improved pulmonary edema since  earlier same date, with only minimal interstitial edema persisting. Improved aeration in the lung bases, with persistent atelectasis and/or pneumonia, left greater than right.  No new abnormalities.  Original Report Authenticated By: Arnell Sieving, M.D.   Dg Chest Port 1 View  01/30/2012  *RADIOLOGY REPORT*  Clinical Data: ARDS  PORTABLE CHEST - 1 VIEW  Comparison: 01/29/2012  Findings: There is a right IJ catheter with tip in the SVC.  The ET tube tip remains above the carina.  There is a nasogastric tube coiled in the stomach.  The heart size appears mildly enlarged. Bilateral interstitial and airspace opacities are noted and appear increased from previous exam.  IMPRESSION:  1.  Worsening aeration to both lungs.  Original Report Authenticated By: Rosealee Albee, M.D.   Portable Chest Xray In Am  01/29/2012  *RADIOLOGY REPORT*  Clinical Data: Check endotracheal tube.  Central venous line.  PORTABLE CHEST - 1 VIEW  Comparison: 01/28/2012.  Findings: Endotracheal tube and right IJ line are stable.  An NG tube courses off the inferior border the film.  The heart is mildly enlarged. A diffuse interstitial pattern is compatible with edema. Mild bibasilar atelectasis is stable.  IMPRESSION:  1.  Stable diffuse interstitial edema. 2.  The support apparatus is stable.  Original Report Authenticated By: Jamesetta Orleans. MATTERN, M.D.   Dg Chest Port 1 View  01/28/2012  *RADIOLOGY REPORT*  Clinical Data: Central line placement  PORTABLE CHEST - 1 VIEW  Comparison: 01/28/2012  Findings: Right IJ catheter tip projects over the proximal SVC / brachiocephalic confluence.  Endotracheal tube tip projects 3.4 cm proximal to the carina.  NG tube descends below the level of the image.  Bilateral airspace opacities are similar to slightly improved in the interval.  Prominent cardiomediastinal contours are similar.  No pneumothorax.  No acute osseous finding.  IMPRESSION: Right IJ catheter  tip projects over the  brachiocephalic confluence / proximal SVC.  No pneumothorax.  Endotracheal tube tip projects 3.4 cm proximal to the carina.  Bilateral airspace opacities are similar to slightly improved in the interval.  Original Report Authenticated By: Waneta Martins, M.D.    Scheduled Meds:   . feeding supplement  1 Container Oral BID BM  . heparin  5,000 Units Subcutaneous Q8H  . imipenem-cilastatin  500 mg Intravenous Q6H  . insulin aspart  0-9 Units Subcutaneous Q4H  . insulin glargine  15 Units Subcutaneous QHS  . multivitamin  5 mL Per Tube Daily  . SAILS Study - rosuvastatin / placebo (PI-Wright)  20 mg Oral Daily  . DISCONTD: albuterol  2.5 mg Nebulization Q4H  . DISCONTD: antiseptic oral rinse  15 mL Mouth Rinse QID  . DISCONTD: chlorhexidine  15 mL Mouth Rinse BID  . DISCONTD: famotidine (PEPCID) IV  20 mg Intravenous Q12H  . DISCONTD: feeding supplement  30 mL Per Tube Daily  . DISCONTD: feeding supplement  60 mL Per Tube TID  . DISCONTD: insulin glargine  10 Units Subcutaneous QHS  . DISCONTD: ipratropium  0.5 mg Nebulization Q4H   Continuous Infusions:   . sodium chloride 10 mL/hr at 02/05/12 0600  . DISCONTD: dextrose      Lambert Keto, MD  [facility] Pager [phone]  If 7PM-7AM, please contact night-coverage [email or web address] Password [facility] 02/05/2012, 8:13 AM

## 2012-02-05 NOTE — Progress Notes (Addendum)
Name: [name] MRN: [id number] DOB: 1971/01/05    LOS: 8  Referring Provider:  [facility]  Reason for Referral:  Acute respiratory failure   PULMONARY / CRITICAL CARE MEDICINE  Brief patient description:  41 y/o female with DM2, who developed ARDS on 7/3 after a lap appy on 7/1 at [facility].  She was transferred to [facility] on 01/28/2012.  Events Since Admission: 7/3 CT Ab>> bibasilar airspace disease, fluid near appy site and drainage catheter, unremarkable small bowel, stomach, colon.   7/4 L Radial Art Line>>7/5 7/5 Paralytics>>>7/7 JP drain out on 7/9  Current Status:  Passed speech eval, thin liquids, walked to door  Vital Signs: Temp:  [97.5 F (36.4 C)-98.7 F (37.1 C)] 97.5 F (36.4 C) (07/12 0522) Pulse Rate:  [62-86] 71  (07/12 0522) Resp:  [11-18] 18  (07/12 0522) BP: (88-115)/(49-73) 100/62 mmHg (07/12 0522) SpO2:  [92 %-99 %] 92 % (07/12 0522)  Intake/Output Summary (Last 24 hours) at 02/05/12 0956 Last data filed at 02/05/12 7829  Gross per 24 hour  Intake 502.84 ml  Output    250 ml  Net 252.84 ml    Physical Examination:  Gen: well appearing, sitting up , comfortable HEENT: NCAT, EOMi PULM: CTA B, few crackles CV: RRR,no mgr AB: BS+, soft, nontender Ext: trace edema Neuro: A&Ox4, maew  Principal Problem:  *Acute respiratory failure with hypoxia Active Problems:  Appendicitis  ARDS (adult respiratory distress syndrome)  Diabetes mellitus  Asthma  Hypertension  Septic shock  Hypokalemia  Anemia   ASSESSMENT AND PLAN  PULMONARY  Lab 02/03/12 0527 02/03/12 0405 02/02/12 0902 02/01/12 0901 01/31/12 0419  PHART 7.544* 7.550* 7.533* 7.434 7.399  PCO2ART 42.6 42.0 48.3* 49.7* 46.6*  PO2ART 73.0* 67.0* 62.0* 103.0* 69.9*  HCO3 36.7* 36.8* 40.6* 33.4* 28.3*  O2SAT 96.0 95.0 93.0 98.0 94.5   Ventilator Settings: None   CXR: 7/11. Mild central pulmonary vascular prominence without frank pulmonary edema.  Removal of  endotracheal tube and nasogastric tube.  Mild prominence aortic knob region. Attention to this on follow-up.   ETT:  7/3 >>7/10  A:   ARDS likely due to HCAP vs. Sepsis from appendicitis;  Asthma, without exacerbation Recovering remarkably well since 7/10 extubation  P:   - see ID for antibiotics - IC - OOB as tolerated - pcxr daily   CARDIOVASCULAR No results found for this basename: TROPONINI:5,LATICACIDVEN:5, O2SATVEN:5,PROBNP:5 in the last 168 hours ECG:  pending Lines: 7/4 R IJ>>> Echo: 7/5 Normal anatomy and function. EF of 65-70%  A:  Septic shock resolve, net negative fluid balance for hospitalization  P:  - home BP meds    RENAL  Lab 02/04/12 0400 02/03/12 0500 02/02/12 0830 02/01/12 1830 02/01/12 0400  NA 139 144 142 143 144  K 4.3 3.4* -- -- --  CL 102 101 98 99 105  CO2 29 34* 36* 37* 32  BUN 19 29* 35* 33* 33*  CREATININE 0.52 0.47* 0.51 0.55 0.54  CALCIUM 8.6 8.6 8.6 8.7 8.8  MG 2.1 -- -- -- 1.9  PHOS -- -- -- -- --   Intake/Output      07/11 0701 - 07/12 0700 07/12 0701 - 07/13 0700   P.O. 120    I.V. (mL/kg) 312.8 (3.2)    NG/GT     IV Piggyback 250    Total Intake(mL/kg) 682.8 (7)    Urine (mL/kg/hr) 400 (0.2)    Total Output 400    Net +282.8  Urine Occurrence 4 x 1 x   Stool Occurrence 3 x 1 x    Foley:  7/4>>>7/11  A:   No acute issues.  UOP of 2.3L in 24hrs. Lasix DC on 7/10. K repleted and normal.   P:   - BMET in am  - follow uop - keep net even   GASTROINTESTINAL  Lab 02/04/12 0400 02/01/12 0400 01/30/12 0400  AST 20 17 27   ALT 11 16 13   ALKPHOS -- -- --  BILITOT -- -- --  PROT -- -- --  ALBUMIN -- -- --    A:   S/p Appy protein calorie malnutrition.  JP drain out 7/9. Mag normal on 7/11  P:   - Advance diet per per Surgery - see ID for ABX   HEMATOLOGIC  Lab 02/03/12 0500 02/01/12 0400 01/31/12 0430 01/29/12 1335  HGB 9.2* 8.6* 9.3* 8.7*  HCT 29.7* 27.0* 29.3* 27.4*  PLT 410* 436* 404* 368    INR -- -- -- --  APTT -- -- -- --   A:  No acute issues. Anemic, but unsure of baseline. Improved on 7/10 to 9.2 from 8.6 on 7/9.  P:  - follow cbc - hep sub q  INFECTIOUS  Lab 02/03/12 0500 02/01/12 0400 01/31/12 0430 01/29/12 1335  WBC 12.2* 13.8* 8.9 14.2*  PROCALCITON -- -- -- --   Cultures: 7/5 resp >>neg 7/2 blood >> No Growth as of 7/11  Antibiotics: 7/2 Vanc HCAP >> 7/9 7/2 Imipenim (HCAP/Appendicitis) >> ??? 7/5  Micafungin>>> 7/10  A:  HCAP (unlikely at this point given improved CXR) and appendicitis.  Cx still negative. DC Vanc 7/9. DC Micofungin on 7/10.  P:   - no indication for imipenem for pneumonia - would d/c imi unless otherwise indicated by surgery  ENDOCRINE  Lab 02/05/12 0748 02/05/12 0356 02/04/12 2341 02/04/12 2218 02/04/12 2104  GLUCAP 113* 126* 133* 137* 131*   A:   DM2. Some elevated sugars, this should increase as pt starts taking PO.   P:   - per primary team  NEUROLOGIC  A:   Awake and alert. Pain well controlled.    Vec off since 7/7.  P:   - Pain well controlled. - PT/OT  PCCM to see as needed over weekend, call if questions  Yolonda Kida PCCM Pager: [phone] Cell: [phone] If no response, call [phone]

## 2012-02-05 NOTE — Progress Notes (Signed)
OT Cancellation Note  Treatment cancelled today due to patient cannot maintain focus with me talking to her, she keeps drifting off thus feel i will not get best answers and she will not retain what I will be going over with her.   Will eval 02/06/2012.Marland Kitchen  [name], [name] [phone] 02/05/2012, 11:54 AM

## 2012-02-05 NOTE — Progress Notes (Signed)
Physical Therapy Treatment Patient Details Name: [name] MRN: [id number] DOB: 1970/08/29 Today's Date: 02/05/2012 Time: 3086-5784 PT Time Calculation (min): 23 min  PT Assessment / Plan / Recommendation Comments on Treatment Session  Pt adm with VDRF after appendectomy.  Pt making excellent progress and should be able to go home IF sister can provide 24 hour care.    Follow Up Recommendations  Home health PT;Supervision/Assistance - 24 hour    Barriers to Discharge        Equipment Recommendations  None recommended by PT    Recommendations for Other Services OT consult  Frequency Min 3X/week   Plan Discharge plan remains appropriate;Frequency remains appropriate    Precautions / Restrictions Precautions Precautions: Fall Restrictions Weight Bearing Restrictions: No   Pertinent Vitals/Pain N/A    Mobility  Bed Mobility Bed Mobility: Supine to Sit;Sitting - Scoot to Edge of Bed Supine to Sit: 4: Min assist;HOB elevated Sitting - Scoot to [place] of Bed: 4: Min assist Details for Bed Mobility Assistance: verbal cues for technique Transfers Sit to Stand: 4: Min assist;With upper extremity assist;With armrests;From bed;From chair/3-in-1 Stand to Sit: 4: Min assist;With upper extremity assist;To chair/3-in-1 Transfer via Lift Equipment: Stedy Details for Transfer Assistance: Used Stedy for transfer from bed to chair. Ambulation/Gait Ambulation/Gait Assistance: 4: Min assist (+1 for person to follow with chair.) Ambulation Distance (Feet): 25 Feet Assistive device: Rolling walker Ambulation/Gait Assistance Details: verbal cues to stand more erect. Gait Pattern: Step-to pattern;Decreased step length - right;Decreased step length - left;Wide base of support (lt knee began to buckle)    Exercises     PT Diagnosis:    PT Problem List:   PT Treatment Interventions:     PT Goals Acute Rehab PT Goals PT Goal: Supine/Side to Sit - Progress: Progressing toward goal PT  Goal: Sit to Stand - Progress: Progressing toward goal PT Goal: Ambulate - Progress: Progressing toward goal  Visit Information  Last PT Received On: 02/05/12 Assistance Needed: +2    Subjective Data  Subjective: "I want to go home."   Cognition  Overall Cognitive Status: Appears within functional limits for tasks assessed/performed Arousal/Alertness: Awake/alert Orientation Level: Appears intact for tasks assessed Behavior During Session: [facility] for tasks performed    Balance  Static Sitting Balance Static Sitting - Balance Support: No upper extremity supported Static Sitting - Level of Assistance: 5: Stand by assistance Static Standing Balance Static Standing - Balance Support: Bilateral upper extremity supported Static Standing - Level of Assistance: 4: Min assist  End of Session PT - End of Session Equipment Utilized During Treatment: Gait belt Activity Tolerance: Patient tolerated treatment well Patient left: in chair;with call bell/phone within reach Nurse Communication: Mobility status   GP     Bryn Perkin 02/05/2012, 9:40 AM  [employer] PT [phone]

## 2012-02-05 NOTE — Progress Notes (Signed)
Patient ID: [name], female   DOB: 1970/08/19, 41 y.o.   MRN: [id number] Patient ID: [name], female   DOB: [birth year], 41 y.o.   MRN: [id number] Patient ID: [name], female   DOB: April 28, 1971, 41 y.o.   MRN: [id number] Patient ID: [name], female   DOB: 28-Aug-1970, 41 y.o.   MRN: [id number]    Subjective: Doing well this morning, " feels weak" tolerating diet, no c/o offered otherwise.  Objective: Vital signs in last 24 hours: Temp:  [97.5 F (36.4 C)-98.7 F (37.1 C)] 97.5 F (36.4 C) (07/12 0522) Pulse Rate:  [62-86] 71  (07/12 0522) Resp:  [11-18] 18  (07/12 0522) BP: (88-115)/(48-73) 100/62 mmHg (07/12 0522) SpO2:  [92 %-99 %] 92 % (07/12 0522) Last BM Date: 02/04/12  Intake/Output from previous day: 07/11 0701 - 07/12 0700 In: 682.8 [P.O.:120; I.V.:312.8; IV Piggyback:250] Out: 400 [Urine:400] Intake/Output this shift:    General appearance: no distress and mildly obese,awake , alert.  Abdomen:  all surgical wounds appear to be healing well, no visible drainage from these sites. Abdomen is otherwise soft,slightly distended  with +BS.  Lab Results:   Basename 02/03/12 0500  WBC 12.2*  HGB 9.2*  HCT 29.7*  PLT 410*   BMET  Basename 02/04/12 0400 02/03/12 0500  NA 139 144  K 4.3 3.4*  CL 102 101  CO2 29 34*  GLUCOSE 168* 195*  BUN 19 29*  CREATININE 0.52 0.47*  CALCIUM 8.6 8.6   PT/INR No results found for this basename: LABPROT:2,INR:2 in the last 72 hours ABG  Basename 02/03/12 0527 02/03/12 0405  PHART 7.544* 7.550*  HCO3 36.7* 36.8*    Studies/Results: Dg Chest Port 1 View  02/05/2012  *RADIOLOGY REPORT*  Clinical Data: Fever, ARDS  PORTABLE CHEST - 1 VIEW  Comparison: Portable exam 0715 hours compared to 02/04/2012  Findings: Right jugular line no longer identified. Borderline enlargement of cardiac silhouette. Pulmonary vascular congestion. Peribronchial thickening. Minimal right basilar atelectasis. Central left upper lobe  infiltrate. No pleural effusion or pneumothorax. Bones unremarkable.  IMPRESSION: Borderline enlargement of cardiac silhouette with pulmonary vascular congestion. Central left upper lobe infiltrate and right basilar atelectasis.  Original Report Authenticated By: Lollie Marrow, M.D.   Dg Chest Port 1 View  02/04/2012  *RADIOLOGY REPORT*  Clinical Data: ARDS. Shortness of breath.  PORTABLE CHEST - 1 VIEW  Comparison: 02/03/2012.  Findings: Endotracheal tube and nasogastric tube have been removed. Right central line unchanged in position at the expected level of the right internal jugular vein / brachiocephalic vein junction. No gross pneumothorax.  Heart slightly prominent in size.  Mild pulmonary vascular prominence.  Mild prominence aortic knob region.  Attention to this on follow- up.  IMPRESSION: Mild central pulmonary vascular prominence without frank pulmonary edema.  Removal of endotracheal tube and nasogastric tube.  Mild prominence aortic knob region.  Attention to this on follow- up.  Original Report Authenticated By: Fuller Canada, M.D.    Anti-infectives: Anti-infectives     Start     Dose/Rate Route Frequency Ordered Stop   01/31/12 0030   vancomycin (VANCOCIN) IVPB 1000 mg/200 mL premix  Status:  Discontinued        1,000 mg 200 mL/hr over 60 Minutes Intravenous Every 12 hours 01/30/12 1947 02/02/12 1101   01/29/12 1400   micafungin (MYCAMINE) 100 mg in sodium chloride 0.9 % 100 mL IVPB  Status:  Discontinued        100  mg 100 mL/hr over 1 Hours Intravenous Daily 01/29/12 1338 02/03/12 1045   01/28/12 1200   vancomycin (VANCOCIN) IVPB 1000 mg/200 mL premix  Status:  Discontinued        1,000 mg 200 mL/hr over 60 Minutes Intravenous Every 8 hours 01/28/12 0739 01/30/12 1947   01/28/12 0900   imipenem-cilastatin (PRIMAXIN) 500 mg in sodium chloride 0.9 % 100 mL IVPB        500 mg 200 mL/hr over 30 Minutes Intravenous Every 6 hours 01/28/12 0739 02/05/12 2359           Assessment/Plan: s/p * No surgery found * 1. PT/OT 2. Follow up surgically should be with patient 's surgeon at [facility].  LOS: 8 days    Blenda Mounts 02/05/2012

## 2012-02-06 LAB — GLUCOSE, CAPILLARY
Glucose-Capillary: 94 mg/dL (ref 70–99)
Glucose-Capillary: 129 mg/dL — ABNORMAL HIGH (ref 70–99)

## 2012-02-06 NOTE — Discharge Summary (Signed)
Physician Discharge Summary  VERDIS BASSETTE ZHY:[id number] DOB: [birth year] DOA: 01/28/2012  PCP: No primary provider on file.  Admit date: 01/28/2012 Discharge date: 02/06/2012  Discharge Diagnoses:  Principal Problem:  *Acute respiratory failure with hypoxia Active Problems:  Appendicitis  ARDS (adult respiratory distress syndrome)  Diabetes mellitus  Asthma  Hypertension  Septic shock  Hypokalemia  Anemia   Discharge Condition: stable.  Disposition:  Follow-up Information    Please follow up. (Patient should follow up with her surgeon at )          Diet:carb modified diet  History of present illness:  41 y/o female with obesity and diabetes who was admitted to [facility] on 7/1 with a ruptured appendix. She underwent a lap appy on 7/1 and on 7/3 developed hypoxemic respiratory failure requiring intubation. She was transferred to [facility] for further management on 7/4.   Hospital Course:  Principal Problem:  *Acute respiratory failure with hypoxia/ARDS (adult respiratory distress syndrome): pateint was transferred from [facility] had to be intubated from.  Septic shock:  -septic shock at outside hospital, on admission to call had to be put on pressors, Levo off on 7/7. FLuid status down 6L since admission. This is mostly secondary to ruptured appendix.  Active Problems:  Ruptured Appendicitis  -outside hospital status post surgery over at [facility], admitted to the ICU team broad-spectrum antibiotics and micafungin the antibiotic treatment was desufflated to imipenem she completed a day course surgery was consulted they recommended to continue current management and complete a piece of antibiotics. This was stopped she tolerated this well. Her diet was advanced and she discharged in stable condition to   Diabetes mellitus: She was changed back to oral dose regimen she'll follow with her primary care Dr.   Hypertension Aftershock resolve her blood pressure started to  trend up her blood pressure medications were started she will continue to follow with her primary care Dr. will titrate her blood pressure medications as tolerated.  Discharge Exam: Filed Vitals:   02/06/12 0541  BP: 95/60  Pulse: 74  Temp: 98.1 F (36.7 C)  Resp: 18   Filed Vitals:   02/05/12 1400 02/05/12 1640 02/05/12 2108 02/06/12 0541  BP: 100/64 109/67 107/73 95/60  Pulse: 73 84 70 74  Temp: 98.4 F (36.9 C)  98.5 F (36.9 C) 98.1 F (36.7 C)  TempSrc: Oral  Oral Oral  Resp: 20  16 18   Height:      Weight:      SpO2: 94%  94% 92%   General: Alert awake and oriented times Cardiovascular: Regular rate and rhythm with positive S1 and S2 Respiratory: Good air movement clear to  Discharge Instructions  Discharge Orders    Future Orders Please Complete By Expires   Diet - low sodium heart healthy      Increase activity slowly        Medication List  As of 02/06/2012 11:35 AM   TAKE these medications         aspirin 325 MG tablet   Take 325 mg by mouth daily.      CALCIUM 600 + D PO   Take 1 tablet by mouth daily.      carvedilol 3.125 MG tablet   Commonly known as: COREG   Take 3.125 mg by mouth 2 (two) times daily with a meal.      gabapentin 600 MG tablet   Commonly known as: NEURONTIN   Take 600 mg by mouth 3 (three) times  daily.      HYDROcodone-acetaminophen 10-500 MG per tablet   Commonly known as: LORTAB   Take 1 tablet by mouth every 6 (six) hours as needed. pain      lisinopril-hydrochlorothiazide 20-12.5 MG per tablet   Commonly known as: PRINZIDE,ZESTORETIC   Take 1 tablet by mouth daily.      medroxyPROGESTERone 150 MG/ML injection   Commonly known as: DEPO-PROVERA   Inject 150 mg into the muscle every 3 (three) months.      metFORMIN 1000 MG tablet   Commonly known as: GLUCOPHAGE   Take 2,000 mg by mouth 2 (two) times daily with a meal.      ranitidine 150 MG tablet   Commonly known as: ZANTAC   Take 150 mg by mouth 2 (two) times  daily.              The results of significant diagnostics from this hospitalization (including imaging, microbiology, ancillary and laboratory) are listed below for reference.    Significant Diagnostic Studies: Ct Abdomen Pelvis W Contrast  01/28/2012  *RADIOLOGY REPORT*  Clinical Data: Status post appendectomy for perforated appendix 01/25/2012.  Drain in place.  Respiratory distress and sepsis. Question abscess.  CT ABDOMEN AND PELVIS WITH CONTRAST  Technique:  Multidetector CT imaging of the abdomen and pelvis was performed following the standard protocol during bolus administration of intravenous contrast.  Contrast: OMNIPAQUE IOHEXOL 300 MG/ML  SOLN  Comparison: CT abdomen and pelvis 01/27/2012.  Findings: Small bilateral pleural effusions are again seen. Extensive bilateral airspace disease in the lung bases persists.  Surgical drain remains in place in the right lower quadrant.  The drain does not localize a fluid collection.  There is a persistent fluid collection in the cul-de-sac measuring 4.6 cm transverse by 2.7 cm AP by 3.7 cm cranial-caudal compared to 5.0 cm transverse by 3.4 cm AP by 4.4 cm cranial-caudal on the prior study.  No new fluid collection is identified.  The patient is status post cholecystectomy.  The liver, spleen, adrenal glands, pancreas and kidneys are unremarkable.  Uterus and adnexa are unremarkable.  Foley catheter is in place in a decompressed urinary bladder.  The stomach and small and large bowel appear normal.  There is no focal bony abnormality.  IMPRESSION:  1.  Slight decrease in the size of a small fluid collection in the cul-de-sac of the pelvis. 2.  Surgical drain remains in place in the right lower quadrant. The drain does not localize a fluid collection. 3.  No marked change in extensive bibasilar airspace disease and small bilateral pleural effusions.  Original Report Authenticated By: Bernadene Bell. Maricela Curet, M.D.   Dg Chest Port 1 View  02/05/2012   *RADIOLOGY REPORT*  Clinical Data: Fever, ARDS  PORTABLE CHEST - 1 VIEW  Comparison: Portable exam 0715 hours compared to 02/04/2012  Findings: Right jugular line no longer identified. Borderline enlargement of cardiac silhouette. Pulmonary vascular congestion. Peribronchial thickening. Minimal right basilar atelectasis. Central left upper lobe infiltrate. No pleural effusion or pneumothorax. Bones unremarkable.  IMPRESSION: Borderline enlargement of cardiac silhouette with pulmonary vascular congestion. Central left upper lobe infiltrate and right basilar atelectasis.  Original Report Authenticated By: Lollie Marrow, M.D.   Dg Chest Port 1 View  02/04/2012  *RADIOLOGY REPORT*  Clinical Data: ARDS. Shortness of breath.  PORTABLE CHEST - 1 VIEW  Comparison: 02/03/2012.  Findings: Endotracheal tube and nasogastric tube have been removed. Right central line unchanged in position at the expected level of the  right internal jugular vein / brachiocephalic vein junction. No gross pneumothorax.  Heart slightly prominent in size.  Mild pulmonary vascular prominence.  Mild prominence aortic knob region.  Attention to this on follow- up.  IMPRESSION: Mild central pulmonary vascular prominence without frank pulmonary edema.  Removal of endotracheal tube and nasogastric tube.  Mild prominence aortic knob region.  Attention to this on follow- up.  Original Report Authenticated By: Fuller Canada, M.D.   Dg Chest Port 1 View  02/03/2012  *RADIOLOGY REPORT*  Clinical Data: Intubated patient.  PORTABLE CHEST - 1 VIEW  Comparison: Chest 02/02/2012.  Findings: Endotracheal tube, NG tube and right IJ catheter unchanged.  Basilar airspace disease, worse on the left, persists. No pneumothorax.  Heart size normal.  IMPRESSION: No interval change.  Original Report Authenticated By: Bernadene Bell. Maricela Curet, M.D.   Dg Chest Port 1 View  02/02/2012  *RADIOLOGY REPORT*  Clinical Data: Intubated.  ARDS.  COPD.  Tuberculosis. Hypertension.   PORTABLE CHEST - 1 VIEW  Comparison: 1 day prior  Findings: Endotracheal tube terminates at the level of the clavicles and is similar.  Nasogastric extends beyond the  inferior aspect of the film.  Right IJ central line terminates at high SVC.  Support apparatus projects over the right upper lung.  Borderline cardiomegaly, accentuated by AP portable technique.  Suspect a small left pleural effusion.  Right costophrenic angle is minimally excluded. No pneumothorax.  Improved to resolved interstitial edema.  Improved right base aeration.  Patchy left base air space disease is slightly improved.  IMPRESSION:  1.  Overall improved aeration, with improved to resolved interstitial edema. 2.  Persistent left base atelectasis or infection with small left pleural effusion.  Original Report Authenticated By: Consuello Bossier, M.D.   Dg Chest Port 1 View  02/01/2012  *RADIOLOGY REPORT*  Clinical Data: Evaluate endotracheal tube.  PORTABLE CHEST - 1 VIEW  Comparison: 01/31/2012.  Findings: Rotation to the left.  Endotracheal tube tip 3.5 cm above the carina.  Right central line tip at the expected level of the proximal superior vena cava. Nasogastric tube courses below the diaphragm.  The tip is not included on this exam.  Consolidation left base may represent infiltrate and / or atelectasis superimposed upon the pulmonary edema.  Heart size top normal slightly enlarged.  Aorta incompletely assessed.  IMPRESSION: Endotracheal tube tip 3.5 cm above the carina.  Consolidation left base may represent infiltrate and / or atelectasis superimposed upon the pulmonary edema.  Original Report Authenticated By: Fuller Canada, M.D.   Dg Chest Port 1 View  01/31/2012  *RADIOLOGY REPORT*  Clinical Data: Ventilator dependent respiratory failure.  Follow up pulmonary edema/ARDS and left lower lobe atelectasis/pneumonia.  PORTABLE CHEST - 1 VIEW 01/31/2012 0547 hours:  Comparison: Portable chest x-rays yesterday and dating back to  01/27/2011.  Findings: Endotracheal tube tip in satisfactory position approximately 7 cm above the carina.  Right jugular central venous catheter tip in the SVC.  Nasogastric courses below the diaphragm into the stomach.  Cardiac silhouette enlarged but stable.  Stable dense left lower lobe consolidation with air bronchograms. Resolution of pulmonary edema with pulmonary venous hypertension persisting.  Stable mild atelectasis at the right lung base.  No new pulmonary parenchymal abnormalities.  IMPRESSION: Support apparatus satisfactory.  Stable dense left lower lobe pneumonia and mild right basilar atelectasis.  Resolution of interstitial pulmonary edema with pulmonary venous hypertension persisting.  No new abnormalities.  Original Report Authenticated By: Arnell Sieving, M.D.  Dg Chest Port 1 View  01/30/2012  *RADIOLOGY REPORT*  Clinical Data: Ventilator dependent respiratory failure.  Follow up edema and atelectasis versus pneumonia.  PORTABLE CHEST - 1 VIEW 01/30/2012 1153 hours:  Comparison: Portable chest x-ray earlier same date 0539 hours and dating back to 01/27/2012.  Findings: Endotracheal tube tip in satisfactory position approximately 5 cm above the carina.  Right jugular central venous catheter tip in the upper SVC.  Nasogastric tube courses below the diaphragm into the stomach.  Cardiac silhouette enlarged but stable.  Interval improvement in the interstitial and airspace pulmonary edema since earlier in the day, with only minimal interstitial edema persisting.  Improved aeration in the lung bases, though moderate consolidation persists, left greater than right.  No new pulmonary parenchymal abnormalities.  IMPRESSION: Support apparatus satisfactory.  Improved pulmonary edema since earlier same date, with only minimal interstitial edema persisting. Improved aeration in the lung bases, with persistent atelectasis and/or pneumonia, left greater than right.  No new abnormalities.  Original  Report Authenticated By: Arnell Sieving, M.D.   Dg Chest Port 1 View  01/30/2012  *RADIOLOGY REPORT*  Clinical Data: ARDS  PORTABLE CHEST - 1 VIEW  Comparison: 01/29/2012  Findings: There is a right IJ catheter with tip in the SVC.  The ET tube tip remains above the carina.  There is a nasogastric tube coiled in the stomach.  The heart size appears mildly enlarged. Bilateral interstitial and airspace opacities are noted and appear increased from previous exam.  IMPRESSION:  1.  Worsening aeration to both lungs.  Original Report Authenticated By: Rosealee Albee, M.D.   Portable Chest Xray In Am  01/29/2012  *RADIOLOGY REPORT*  Clinical Data: Check endotracheal tube.  Central venous line.  PORTABLE CHEST - 1 VIEW  Comparison: 01/28/2012.  Findings: Endotracheal tube and right IJ line are stable.  An NG tube courses off the inferior border the film.  The heart is mildly enlarged. A diffuse interstitial pattern is compatible with edema. Mild bibasilar atelectasis is stable.  IMPRESSION:  1.  Stable diffuse interstitial edema. 2.  The support apparatus is stable.  Original Report Authenticated By: Jamesetta Orleans. MATTERN, M.D.   Dg Chest Port 1 View  01/28/2012  *RADIOLOGY REPORT*  Clinical Data: Central line placement  PORTABLE CHEST - 1 VIEW  Comparison: 01/28/2012  Findings: Right IJ catheter tip projects over the proximal SVC / brachiocephalic confluence.  Endotracheal tube tip projects 3.4 cm proximal to the carina.  NG tube descends below the level of the image.  Bilateral airspace opacities are similar to slightly improved in the interval.  Prominent cardiomediastinal contours are similar.  No pneumothorax.  No acute osseous finding.  IMPRESSION: Right IJ catheter tip projects over the brachiocephalic confluence / proximal SVC.  No pneumothorax.  Endotracheal tube tip projects 3.4 cm proximal to the carina.  Bilateral airspace opacities are similar to slightly improved in the interval.  Original Report  Authenticated By: Waneta Martins, M.D.    Microbiology: Recent Results (from the past 240 hour(s))  MRSA PCR SCREENING     Status: Normal   Collection Time   01/28/12  5:36 AM      Component Value Range Status Comment   MRSA by PCR NEGATIVE  NEGATIVE Final   CULTURE, BLOOD (ROUTINE X 2)     Status: Normal   Collection Time   01/28/12  8:30 AM      Component Value Range Status Comment   Specimen Description BLOOD LEFT ARM  Final    Special Requests BOTTLES DRAWN AEROBIC AND ANAEROBIC 10CC   Final    Culture  Setup Time 01/28/2012 16:55   Final    Culture NO GROWTH 5 DAYS   Final    Report Status 02/03/2012 FINAL   Final   CULTURE, BLOOD (ROUTINE X 2)     Status: Normal   Collection Time   01/28/12  8:45 AM      Component Value Range Status Comment   Specimen Description BLOOD LEFT ARM   Final    Special Requests BOTTLES DRAWN AEROBIC AND ANAEROBIC 10CC   Final    Culture  Setup Time 01/28/2012 16:55   Final    Culture NO GROWTH 5 DAYS   Final    Report Status 02/03/2012 FINAL   Final   CULTURE, RESPIRATORY     Status: Normal   Collection Time   01/29/12 11:09 AM      Component Value Range Status Comment   Specimen Description TRACHEAL ASPIRATE   Final    Special Requests NONE   Final    Gram Stain     Final    Value: FEW WBC PRESENT,BOTH PMN AND MONONUCLEAR     FEW SQUAMOUS EPITHELIAL CELLS PRESENT     NO ORGANISMS SEEN   Culture NO GROWTH 2 DAYS   Final    Report Status 01/31/2012 FINAL   Final      Labs: Basic Metabolic Panel:  Lab 02/04/12 1610 02/03/12 0500 02/02/12 0830 02/01/12 1830 02/01/12 0400  NA 139 144 142 143 144  K 4.3 3.4* -- -- --  CL 102 101 98 99 105  CO2 29 34* 36* 37* 32  GLUCOSE 168* 195* 217* 194* 194*  BUN 19 29* 35* 33* 33*  CREATININE 0.52 0.47* 0.51 0.55 0.54  CALCIUM 8.6 8.6 8.6 8.7 8.8  MG 2.1 -- -- -- 1.9  PHOS -- -- -- -- --   Liver Function Tests:  Lab 02/04/12 0400 02/01/12 0400  AST 20 17  ALT 11 16  ALKPHOS -- --  BILITOT --  --  PROT -- --  ALBUMIN -- --   No results found for this basename: LIPASE:5,AMYLASE:5 in the last 168 hours No results found for this basename: AMMONIA:5 in the last 168 hours CBC:  Lab 02/03/12 0500 02/01/12 0400 01/31/12 0430  WBC 12.2* 13.8* 8.9  NEUTROABS -- -- --  HGB 9.2* 8.6* 9.3*  HCT 29.7* 27.0* 29.3*  MCV 101.7* 100.0 99.0  PLT 410* 436* 404*   Cardiac Enzymes:  Lab 02/04/12 0400 02/01/12 0400  CKTOTAL 54 10  CKMB -- --  CKMBINDEX -- --  TROPONINI -- --   BNP: No components found with this basename: POCBNP:5 CBG:  Lab 02/06/12 0808 02/05/12 2151 02/05/12 1646 02/05/12 1148 02/05/12 0748  GLUCAP 94 101* 98 123* 113*    Time coordinating discharge: Rhythm 45 minutes  Signed:  Marinda Elk  [facility] 02/06/2012, 11:35 AM

## 2012-02-06 NOTE — Progress Notes (Signed)
Physical Therapy Treatment Patient Details Name: [name] MRN: [id number] DOB: Sep 23, 1970 Today's Date: 02/06/2012 Time: 4098-1191 PT Time Calculation (min): 12 min  PT Assessment / Plan / Recommendation Comments on Treatment Session  Pt making great progress with mobility.  Very excited today due to possibly d/cing home.  Pt states she will go to her sister's house & her sister & brother in law will be able to provide assistance/supervision.      Follow Up Recommendations  Home health PT;Supervision/Assistance - 24 hour    Barriers to Discharge        Equipment Recommendations       Recommendations for Other Services    Frequency Min 3X/week   Plan Discharge plan remains appropriate;Frequency remains appropriate    Precautions / Restrictions Precautions Precautions: Fall Restrictions Weight Bearing Restrictions: No       Mobility  Transfers Transfers: Sit to Stand;Stand to Sit Sit to Stand: 5: Supervision;With upper extremity assist;From bed Stand to Sit: 5: Supervision;With upper extremity assist;To bed Details for Transfer Assistance: Supervision for safety.  Cues for safe hand placement & technique Ambulation/Gait Ambulation/Gait Assistance: 4: Min guard Ambulation Distance (Feet): 120 Feet Assistive device: Rolling walker Ambulation/Gait Assistance Details: Guarding for safety.  Ocassional increased lateral sway but no physical assist needed.  Cues to slow down & taker her time.  Pt very excited due to possibility of d/cing home today.   Gait Pattern: Step-through pattern;Decreased stride length Stairs: Yes Stairs Assistance: 4: Min guard Stair Management Technique: Two rails;Step to pattern;Forwards Number of Stairs: 5  Wheelchair Mobility Wheelchair Mobility: No      PT Goals Acute Rehab PT Goals Time For Goal Achievement: 02/18/12 PT Goal: Sit to Stand - Progress: Progressing toward goal PT Goal: Ambulate - Progress: Progressing toward goal PT  Goal: Up/Down Stairs - Progress: Progressing toward goal  Visit Information  Last PT Received On: 02/06/12 Assistance Needed: +1    Subjective Data      Cognition  Overall Cognitive Status: Appears within functional limits for tasks assessed/performed Arousal/Alertness: Awake/alert Orientation Level: Appears intact for tasks assessed Behavior During Session: [facility] for tasks performed    Balance     End of Session PT - End of Session Equipment Utilized During Treatment: Gait belt Activity Tolerance: Patient tolerated treatment well Patient left: Other (comment) (sitting EOB)    Verdell Face, PTA [phone] 02/06/2012

## 2012-02-09 DIAGNOSIS — I2699 Other pulmonary embolism without acute cor pulmonale: Secondary | ICD-10-CM

## 2012-03-14 DIAGNOSIS — R079 Chest pain, unspecified: Secondary | ICD-10-CM

## 2012-03-22 DIAGNOSIS — R079 Chest pain, unspecified: Secondary | ICD-10-CM

## 2012-11-29 DIAGNOSIS — F411 Generalized anxiety disorder: Secondary | ICD-10-CM | POA: Diagnosis not present

## 2012-11-29 DIAGNOSIS — I2699 Other pulmonary embolism without acute cor pulmonale: Secondary | ICD-10-CM | POA: Diagnosis not present

## 2012-11-29 DIAGNOSIS — F329 Major depressive disorder, single episode, unspecified: Secondary | ICD-10-CM | POA: Diagnosis not present

## 2012-11-29 DIAGNOSIS — I10 Essential (primary) hypertension: Secondary | ICD-10-CM | POA: Diagnosis not present

## 2013-01-04 DIAGNOSIS — H538 Other visual disturbances: Secondary | ICD-10-CM | POA: Diagnosis not present

## 2013-01-04 DIAGNOSIS — E1139 Type 2 diabetes mellitus with other diabetic ophthalmic complication: Secondary | ICD-10-CM | POA: Diagnosis not present

## 2013-01-20 DIAGNOSIS — E785 Hyperlipidemia, unspecified: Secondary | ICD-10-CM | POA: Diagnosis not present

## 2013-01-20 DIAGNOSIS — I739 Peripheral vascular disease, unspecified: Secondary | ICD-10-CM | POA: Diagnosis not present

## 2013-01-20 DIAGNOSIS — E119 Type 2 diabetes mellitus without complications: Secondary | ICD-10-CM | POA: Diagnosis not present

## 2013-01-20 DIAGNOSIS — I10 Essential (primary) hypertension: Secondary | ICD-10-CM | POA: Diagnosis not present

## 2013-02-17 DIAGNOSIS — E119 Type 2 diabetes mellitus without complications: Secondary | ICD-10-CM | POA: Diagnosis not present

## 2013-02-17 DIAGNOSIS — I10 Essential (primary) hypertension: Secondary | ICD-10-CM | POA: Diagnosis not present

## 2013-02-17 DIAGNOSIS — I739 Peripheral vascular disease, unspecified: Secondary | ICD-10-CM | POA: Diagnosis not present

## 2013-02-17 DIAGNOSIS — E559 Vitamin D deficiency, unspecified: Secondary | ICD-10-CM | POA: Diagnosis not present

## 2013-02-17 DIAGNOSIS — I515 Myocardial degeneration: Secondary | ICD-10-CM | POA: Diagnosis not present

## 2013-02-17 DIAGNOSIS — R0602 Shortness of breath: Secondary | ICD-10-CM | POA: Diagnosis not present

## 2013-02-17 DIAGNOSIS — E785 Hyperlipidemia, unspecified: Secondary | ICD-10-CM | POA: Diagnosis not present

## 2013-02-17 DIAGNOSIS — I517 Cardiomegaly: Secondary | ICD-10-CM | POA: Diagnosis not present

## 2013-02-17 DIAGNOSIS — I059 Rheumatic mitral valve disease, unspecified: Secondary | ICD-10-CM | POA: Diagnosis not present

## 2013-02-17 DIAGNOSIS — F17200 Nicotine dependence, unspecified, uncomplicated: Secondary | ICD-10-CM | POA: Diagnosis not present

## 2013-02-21 DIAGNOSIS — I2699 Other pulmonary embolism without acute cor pulmonale: Secondary | ICD-10-CM | POA: Diagnosis not present

## 2013-02-21 DIAGNOSIS — F411 Generalized anxiety disorder: Secondary | ICD-10-CM | POA: Diagnosis not present

## 2013-02-21 DIAGNOSIS — F329 Major depressive disorder, single episode, unspecified: Secondary | ICD-10-CM | POA: Diagnosis not present

## 2013-02-21 DIAGNOSIS — Z3049 Encounter for surveillance of other contraceptives: Secondary | ICD-10-CM | POA: Diagnosis not present

## 2013-02-21 DIAGNOSIS — I10 Essential (primary) hypertension: Secondary | ICD-10-CM | POA: Diagnosis not present

## 2013-02-24 DIAGNOSIS — H60509 Unspecified acute noninfective otitis externa, unspecified ear: Secondary | ICD-10-CM | POA: Diagnosis not present

## 2013-02-24 DIAGNOSIS — H6120 Impacted cerumen, unspecified ear: Secondary | ICD-10-CM | POA: Diagnosis not present

## 2013-02-28 DIAGNOSIS — Z79899 Other long term (current) drug therapy: Secondary | ICD-10-CM | POA: Diagnosis not present

## 2013-02-28 DIAGNOSIS — R0789 Other chest pain: Secondary | ICD-10-CM | POA: Diagnosis not present

## 2013-02-28 DIAGNOSIS — Z86711 Personal history of pulmonary embolism: Secondary | ICD-10-CM | POA: Diagnosis not present

## 2013-02-28 DIAGNOSIS — Z7982 Long term (current) use of aspirin: Secondary | ICD-10-CM | POA: Diagnosis not present

## 2013-02-28 DIAGNOSIS — Z7901 Long term (current) use of anticoagulants: Secondary | ICD-10-CM | POA: Diagnosis not present

## 2013-02-28 DIAGNOSIS — J9819 Other pulmonary collapse: Secondary | ICD-10-CM | POA: Diagnosis not present

## 2013-02-28 DIAGNOSIS — F411 Generalized anxiety disorder: Secondary | ICD-10-CM | POA: Diagnosis not present

## 2013-02-28 DIAGNOSIS — I10 Essential (primary) hypertension: Secondary | ICD-10-CM | POA: Diagnosis not present

## 2013-02-28 DIAGNOSIS — E119 Type 2 diabetes mellitus without complications: Secondary | ICD-10-CM | POA: Diagnosis not present

## 2013-02-28 DIAGNOSIS — F17200 Nicotine dependence, unspecified, uncomplicated: Secondary | ICD-10-CM | POA: Diagnosis not present

## 2013-03-15 DIAGNOSIS — H6120 Impacted cerumen, unspecified ear: Secondary | ICD-10-CM | POA: Diagnosis not present

## 2013-03-21 DIAGNOSIS — H60399 Other infective otitis externa, unspecified ear: Secondary | ICD-10-CM | POA: Diagnosis not present

## 2013-03-21 DIAGNOSIS — H6120 Impacted cerumen, unspecified ear: Secondary | ICD-10-CM | POA: Diagnosis not present

## 2013-03-22 DIAGNOSIS — R000 Tachycardia, unspecified: Secondary | ICD-10-CM | POA: Diagnosis not present

## 2013-03-22 DIAGNOSIS — F17200 Nicotine dependence, unspecified, uncomplicated: Secondary | ICD-10-CM | POA: Diagnosis not present

## 2013-03-22 DIAGNOSIS — Z7982 Long term (current) use of aspirin: Secondary | ICD-10-CM | POA: Diagnosis not present

## 2013-03-22 DIAGNOSIS — I10 Essential (primary) hypertension: Secondary | ICD-10-CM | POA: Diagnosis not present

## 2013-03-22 DIAGNOSIS — R079 Chest pain, unspecified: Secondary | ICD-10-CM | POA: Diagnosis not present

## 2013-03-22 DIAGNOSIS — M25519 Pain in unspecified shoulder: Secondary | ICD-10-CM | POA: Diagnosis not present

## 2013-03-22 DIAGNOSIS — Z79899 Other long term (current) drug therapy: Secondary | ICD-10-CM | POA: Diagnosis not present

## 2013-03-22 DIAGNOSIS — Z86711 Personal history of pulmonary embolism: Secondary | ICD-10-CM | POA: Diagnosis not present

## 2013-03-22 DIAGNOSIS — E119 Type 2 diabetes mellitus without complications: Secondary | ICD-10-CM | POA: Diagnosis not present

## 2013-03-22 DIAGNOSIS — Z7901 Long term (current) use of anticoagulants: Secondary | ICD-10-CM | POA: Diagnosis not present

## 2013-03-22 DIAGNOSIS — I739 Peripheral vascular disease, unspecified: Secondary | ICD-10-CM | POA: Diagnosis not present

## 2013-03-31 DIAGNOSIS — H9190 Unspecified hearing loss, unspecified ear: Secondary | ICD-10-CM | POA: Diagnosis not present

## 2013-03-31 DIAGNOSIS — H6120 Impacted cerumen, unspecified ear: Secondary | ICD-10-CM | POA: Diagnosis not present

## 2013-03-31 DIAGNOSIS — R42 Dizziness and giddiness: Secondary | ICD-10-CM | POA: Diagnosis not present

## 2013-04-14 DIAGNOSIS — R209 Unspecified disturbances of skin sensation: Secondary | ICD-10-CM | POA: Diagnosis not present

## 2013-04-14 DIAGNOSIS — M79609 Pain in unspecified limb: Secondary | ICD-10-CM | POA: Diagnosis not present

## 2013-04-22 DIAGNOSIS — R42 Dizziness and giddiness: Secondary | ICD-10-CM | POA: Diagnosis not present

## 2013-04-22 DIAGNOSIS — E119 Type 2 diabetes mellitus without complications: Secondary | ICD-10-CM | POA: Diagnosis not present

## 2013-04-22 DIAGNOSIS — G43909 Migraine, unspecified, not intractable, without status migrainosus: Secondary | ICD-10-CM | POA: Diagnosis not present

## 2013-04-22 DIAGNOSIS — I10 Essential (primary) hypertension: Secondary | ICD-10-CM | POA: Diagnosis not present

## 2013-04-22 DIAGNOSIS — F17200 Nicotine dependence, unspecified, uncomplicated: Secondary | ICD-10-CM | POA: Diagnosis not present

## 2013-04-22 DIAGNOSIS — Z79899 Other long term (current) drug therapy: Secondary | ICD-10-CM | POA: Diagnosis not present

## 2013-04-22 DIAGNOSIS — I739 Peripheral vascular disease, unspecified: Secondary | ICD-10-CM | POA: Diagnosis not present

## 2013-04-22 DIAGNOSIS — Z7982 Long term (current) use of aspirin: Secondary | ICD-10-CM | POA: Diagnosis not present

## 2013-05-15 DIAGNOSIS — I10 Essential (primary) hypertension: Secondary | ICD-10-CM | POA: Diagnosis not present

## 2013-05-15 DIAGNOSIS — I2699 Other pulmonary embolism without acute cor pulmonale: Secondary | ICD-10-CM | POA: Diagnosis not present

## 2013-05-15 DIAGNOSIS — F411 Generalized anxiety disorder: Secondary | ICD-10-CM | POA: Diagnosis not present

## 2013-05-15 DIAGNOSIS — G4489 Other headache syndrome: Secondary | ICD-10-CM | POA: Diagnosis not present

## 2013-05-15 DIAGNOSIS — F329 Major depressive disorder, single episode, unspecified: Secondary | ICD-10-CM | POA: Diagnosis not present

## 2013-05-26 DIAGNOSIS — I739 Peripheral vascular disease, unspecified: Secondary | ICD-10-CM | POA: Diagnosis not present

## 2013-05-26 DIAGNOSIS — I10 Essential (primary) hypertension: Secondary | ICD-10-CM | POA: Diagnosis not present

## 2013-05-26 DIAGNOSIS — E119 Type 2 diabetes mellitus without complications: Secondary | ICD-10-CM | POA: Diagnosis not present

## 2013-05-26 DIAGNOSIS — E785 Hyperlipidemia, unspecified: Secondary | ICD-10-CM | POA: Diagnosis not present

## 2013-06-14 DIAGNOSIS — H6120 Impacted cerumen, unspecified ear: Secondary | ICD-10-CM | POA: Diagnosis not present

## 2013-07-01 DIAGNOSIS — J45909 Unspecified asthma, uncomplicated: Secondary | ICD-10-CM | POA: Diagnosis not present

## 2013-07-01 DIAGNOSIS — E119 Type 2 diabetes mellitus without complications: Secondary | ICD-10-CM | POA: Diagnosis not present

## 2013-07-01 DIAGNOSIS — Z7982 Long term (current) use of aspirin: Secondary | ICD-10-CM | POA: Diagnosis not present

## 2013-07-01 DIAGNOSIS — Z7901 Long term (current) use of anticoagulants: Secondary | ICD-10-CM | POA: Diagnosis not present

## 2013-07-01 DIAGNOSIS — K2990 Gastroduodenitis, unspecified, without bleeding: Secondary | ICD-10-CM | POA: Diagnosis not present

## 2013-07-01 DIAGNOSIS — Z79899 Other long term (current) drug therapy: Secondary | ICD-10-CM | POA: Diagnosis not present

## 2013-07-01 DIAGNOSIS — K2970 Gastritis, unspecified, without bleeding: Secondary | ICD-10-CM | POA: Diagnosis not present

## 2013-07-01 DIAGNOSIS — F17200 Nicotine dependence, unspecified, uncomplicated: Secondary | ICD-10-CM | POA: Diagnosis not present

## 2013-07-01 DIAGNOSIS — I10 Essential (primary) hypertension: Secondary | ICD-10-CM | POA: Diagnosis not present

## 2013-08-02 DIAGNOSIS — Z9889 Other specified postprocedural states: Secondary | ICD-10-CM | POA: Diagnosis not present

## 2013-08-02 DIAGNOSIS — I739 Peripheral vascular disease, unspecified: Secondary | ICD-10-CM | POA: Diagnosis not present

## 2013-08-07 DIAGNOSIS — F3289 Other specified depressive episodes: Secondary | ICD-10-CM | POA: Diagnosis not present

## 2013-08-07 DIAGNOSIS — IMO0001 Reserved for inherently not codable concepts without codable children: Secondary | ICD-10-CM | POA: Diagnosis not present

## 2013-08-07 DIAGNOSIS — G4489 Other headache syndrome: Secondary | ICD-10-CM | POA: Diagnosis not present

## 2013-08-07 DIAGNOSIS — I2699 Other pulmonary embolism without acute cor pulmonale: Secondary | ICD-10-CM | POA: Diagnosis not present

## 2013-08-07 DIAGNOSIS — F329 Major depressive disorder, single episode, unspecified: Secondary | ICD-10-CM | POA: Diagnosis not present

## 2013-08-07 DIAGNOSIS — F411 Generalized anxiety disorder: Secondary | ICD-10-CM | POA: Diagnosis not present

## 2013-08-07 DIAGNOSIS — I10 Essential (primary) hypertension: Secondary | ICD-10-CM | POA: Diagnosis not present

## 2013-10-31 DIAGNOSIS — F3289 Other specified depressive episodes: Secondary | ICD-10-CM | POA: Diagnosis not present

## 2013-10-31 DIAGNOSIS — M129 Arthropathy, unspecified: Secondary | ICD-10-CM | POA: Diagnosis not present

## 2013-10-31 DIAGNOSIS — I10 Essential (primary) hypertension: Secondary | ICD-10-CM | POA: Diagnosis not present

## 2013-10-31 DIAGNOSIS — F329 Major depressive disorder, single episode, unspecified: Secondary | ICD-10-CM | POA: Diagnosis not present

## 2013-10-31 DIAGNOSIS — F411 Generalized anxiety disorder: Secondary | ICD-10-CM | POA: Diagnosis not present

## 2013-10-31 DIAGNOSIS — K219 Gastro-esophageal reflux disease without esophagitis: Secondary | ICD-10-CM | POA: Diagnosis not present

## 2013-10-31 DIAGNOSIS — IMO0001 Reserved for inherently not codable concepts without codable children: Secondary | ICD-10-CM | POA: Diagnosis not present

## 2013-10-31 DIAGNOSIS — I2699 Other pulmonary embolism without acute cor pulmonale: Secondary | ICD-10-CM | POA: Diagnosis not present

## 2013-10-31 DIAGNOSIS — E538 Deficiency of other specified B group vitamins: Secondary | ICD-10-CM | POA: Diagnosis not present

## 2013-11-07 DIAGNOSIS — F411 Generalized anxiety disorder: Secondary | ICD-10-CM | POA: Diagnosis not present

## 2013-11-07 DIAGNOSIS — F329 Major depressive disorder, single episode, unspecified: Secondary | ICD-10-CM | POA: Diagnosis not present

## 2013-11-07 DIAGNOSIS — F3289 Other specified depressive episodes: Secondary | ICD-10-CM | POA: Diagnosis not present

## 2013-11-07 DIAGNOSIS — G4489 Other headache syndrome: Secondary | ICD-10-CM | POA: Diagnosis not present

## 2013-11-07 DIAGNOSIS — IMO0001 Reserved for inherently not codable concepts without codable children: Secondary | ICD-10-CM | POA: Diagnosis not present

## 2013-11-07 DIAGNOSIS — I2699 Other pulmonary embolism without acute cor pulmonale: Secondary | ICD-10-CM | POA: Diagnosis not present

## 2013-11-07 DIAGNOSIS — M129 Arthropathy, unspecified: Secondary | ICD-10-CM | POA: Diagnosis not present

## 2013-11-07 DIAGNOSIS — I10 Essential (primary) hypertension: Secondary | ICD-10-CM | POA: Diagnosis not present

## 2014-01-04 DIAGNOSIS — R109 Unspecified abdominal pain: Secondary | ICD-10-CM | POA: Diagnosis not present

## 2014-01-04 DIAGNOSIS — M79609 Pain in unspecified limb: Secondary | ICD-10-CM | POA: Diagnosis not present

## 2014-01-04 DIAGNOSIS — IMO0001 Reserved for inherently not codable concepts without codable children: Secondary | ICD-10-CM | POA: Diagnosis not present

## 2014-01-09 DIAGNOSIS — R109 Unspecified abdominal pain: Secondary | ICD-10-CM | POA: Diagnosis not present

## 2014-01-09 DIAGNOSIS — M25559 Pain in unspecified hip: Secondary | ICD-10-CM | POA: Diagnosis not present

## 2014-01-09 DIAGNOSIS — R112 Nausea with vomiting, unspecified: Secondary | ICD-10-CM | POA: Diagnosis not present

## 2014-01-09 DIAGNOSIS — K7689 Other specified diseases of liver: Secondary | ICD-10-CM | POA: Diagnosis not present

## 2014-01-15 DIAGNOSIS — I739 Peripheral vascular disease, unspecified: Secondary | ICD-10-CM | POA: Diagnosis not present

## 2014-01-15 DIAGNOSIS — J45909 Unspecified asthma, uncomplicated: Secondary | ICD-10-CM | POA: Diagnosis not present

## 2014-01-15 DIAGNOSIS — Z7901 Long term (current) use of anticoagulants: Secondary | ICD-10-CM | POA: Diagnosis not present

## 2014-01-15 DIAGNOSIS — E1065 Type 1 diabetes mellitus with hyperglycemia: Secondary | ICD-10-CM | POA: Diagnosis not present

## 2014-01-15 DIAGNOSIS — IMO0002 Reserved for concepts with insufficient information to code with codable children: Secondary | ICD-10-CM | POA: Diagnosis not present

## 2014-01-15 DIAGNOSIS — T50901A Poisoning by unspecified drugs, medicaments and biological substances, accidental (unintentional), initial encounter: Secondary | ICD-10-CM | POA: Diagnosis not present

## 2014-01-15 DIAGNOSIS — I10 Essential (primary) hypertension: Secondary | ICD-10-CM | POA: Diagnosis not present

## 2014-01-15 DIAGNOSIS — F17200 Nicotine dependence, unspecified, uncomplicated: Secondary | ICD-10-CM | POA: Diagnosis not present

## 2014-01-15 DIAGNOSIS — R112 Nausea with vomiting, unspecified: Secondary | ICD-10-CM | POA: Diagnosis not present

## 2014-01-15 DIAGNOSIS — Z8489 Family history of other specified conditions: Secondary | ICD-10-CM | POA: Diagnosis not present

## 2014-01-15 DIAGNOSIS — K769 Liver disease, unspecified: Secondary | ICD-10-CM | POA: Diagnosis not present

## 2014-01-15 DIAGNOSIS — Z833 Family history of diabetes mellitus: Secondary | ICD-10-CM | POA: Diagnosis not present

## 2014-01-15 DIAGNOSIS — E785 Hyperlipidemia, unspecified: Secondary | ICD-10-CM | POA: Diagnosis not present

## 2014-01-15 DIAGNOSIS — Z823 Family history of stroke: Secondary | ICD-10-CM | POA: Diagnosis not present

## 2014-01-15 DIAGNOSIS — R4182 Altered mental status, unspecified: Secondary | ICD-10-CM | POA: Diagnosis not present

## 2014-01-15 DIAGNOSIS — R55 Syncope and collapse: Secondary | ICD-10-CM | POA: Diagnosis not present

## 2014-01-15 DIAGNOSIS — Z7982 Long term (current) use of aspirin: Secondary | ICD-10-CM | POA: Diagnosis not present

## 2014-01-15 DIAGNOSIS — E7800 Pure hypercholesterolemia, unspecified: Secondary | ICD-10-CM | POA: Diagnosis not present

## 2014-01-15 DIAGNOSIS — R51 Headache: Secondary | ICD-10-CM | POA: Diagnosis not present

## 2014-01-15 DIAGNOSIS — Z794 Long term (current) use of insulin: Secondary | ICD-10-CM | POA: Diagnosis not present

## 2014-01-15 DIAGNOSIS — Z86711 Personal history of pulmonary embolism: Secondary | ICD-10-CM | POA: Diagnosis not present

## 2014-01-15 DIAGNOSIS — Z79899 Other long term (current) drug therapy: Secondary | ICD-10-CM | POA: Diagnosis not present

## 2014-01-16 DIAGNOSIS — R4182 Altered mental status, unspecified: Secondary | ICD-10-CM | POA: Diagnosis not present

## 2014-01-16 DIAGNOSIS — R51 Headache: Secondary | ICD-10-CM | POA: Diagnosis not present

## 2014-01-22 DIAGNOSIS — F3289 Other specified depressive episodes: Secondary | ICD-10-CM | POA: Diagnosis not present

## 2014-01-22 DIAGNOSIS — IMO0001 Reserved for inherently not codable concepts without codable children: Secondary | ICD-10-CM | POA: Diagnosis not present

## 2014-01-22 DIAGNOSIS — G4489 Other headache syndrome: Secondary | ICD-10-CM | POA: Diagnosis not present

## 2014-01-22 DIAGNOSIS — F329 Major depressive disorder, single episode, unspecified: Secondary | ICD-10-CM | POA: Diagnosis not present

## 2014-01-22 DIAGNOSIS — R110 Nausea: Secondary | ICD-10-CM | POA: Diagnosis not present

## 2014-01-22 DIAGNOSIS — F411 Generalized anxiety disorder: Secondary | ICD-10-CM | POA: Diagnosis not present

## 2014-01-24 ENCOUNTER — Telehealth: Payer: Self-pay | Admitting: Gastroenterology

## 2014-01-24 ENCOUNTER — Other Ambulatory Visit: Payer: Self-pay | Admitting: Gastroenterology

## 2014-01-24 DIAGNOSIS — K769 Liver disease, unspecified: Secondary | ICD-10-CM

## 2014-01-24 NOTE — Telephone Encounter (Signed)
MRI OF LIVER W/EOVIST DUEL PHASE [facility] ON Monday July 6th AT 8:30 AM AND MRS. Stamour IS AWARE

## 2014-01-24 NOTE — Telephone Encounter (Signed)
Thank you :)

## 2014-01-29 ENCOUNTER — Telehealth: Payer: Self-pay

## 2014-01-29 DIAGNOSIS — K7689 Other specified diseases of liver: Secondary | ICD-10-CM | POA: Diagnosis not present

## 2014-01-29 NOTE — Telephone Encounter (Signed)
T/C [facility] with call report on MRI of abdomen. She is faxing the report to me and she said to inform provider to focus on Impression #1 ( small hepatic lesion).

## 2014-01-29 NOTE — Telephone Encounter (Signed)
Fax given to Gerrit HallsAnna Sams, NP.

## 2014-01-30 ENCOUNTER — Ambulatory Visit (INDEPENDENT_AMBULATORY_CARE_PROVIDER_SITE_OTHER): Payer: Medicare Other | Admitting: Gastroenterology

## 2014-01-30 ENCOUNTER — Encounter: Payer: Self-pay | Admitting: Gastroenterology

## 2014-01-30 ENCOUNTER — Encounter (INDEPENDENT_AMBULATORY_CARE_PROVIDER_SITE_OTHER): Payer: Self-pay

## 2014-01-30 ENCOUNTER — Other Ambulatory Visit: Payer: Self-pay | Admitting: Gastroenterology

## 2014-01-30 VITALS — BP 108/65 | HR 83 | Temp 98.9°F | Resp 18 | Ht 65.0 in | Wt 212.0 lb

## 2014-01-30 DIAGNOSIS — R1011 Right upper quadrant pain: Secondary | ICD-10-CM | POA: Diagnosis not present

## 2014-01-30 DIAGNOSIS — K750 Abscess of liver: Secondary | ICD-10-CM

## 2014-01-30 MED ORDER — PANTOPRAZOLE SODIUM 40 MG PO TBEC: 40.0000 mg | DELAYED_RELEASE_TABLET | Freq: Every day | ORAL | Status: DC

## 2014-01-30 MED ORDER — AMOXICILLIN-POT CLAVULANATE 875-125 MG PO TABS: 1.0000 | ORAL_TABLET | Freq: Two times a day (BID) | ORAL | Status: DC

## 2014-01-30 NOTE — Progress Notes (Signed)
Primary Care Physician:  Selinda FlavinHOWARD, KEVIN, MD Primary Gastroenterologist:  Dr. Darrick PennaFields   Chief Complaint  Patient presents with  . Referral    HPI:   [name] presents today as an urgent work-in secondary to enlarging liver lesion noted on CT. Patient has not been seen by our practice before. Hospitalized at [facility] in late June secondary to a syncopal episode of unknown etiology, incidentally found to have a 2.2 by 1.8 cm right lobe liver lesion, with interval growth since Dec 2014. MRI performed just prior to appt, noting concerning for a small hepatic abscess and markedly fatty liver. Discussed in detail with Dr. Tyron RussellBoles, who states the lesion appears to be typical of fluid, with post-contrast images noting peripheral enhancement, not typical of an hemangioma. Feels hot sometimes. Doesn't take temperature. N/V for several months. RUQ pain, radiating down chronically for 2 years, since ruptured appendix in July 2013. Eats because she has to. Seldom gets hungry. N/V not related to eating. Alternating constipation/diarrhea. No rectal bleeding. On zantac only, no PPI.   Past Medical History  Diagnosis Date  . Peripheral vascular disease   . Hypertension   . Anginal pain   . Anxiety   . Depression   . Asthma   . COPD (chronic obstructive pulmonary disease)   . Shortness of breath   . Tuberculosis   . GERD (gastroesophageal reflux disease)   . Headache(784.0)   . Arthritis   . Diabetes mellitus 01/28/2012  . Anemia 02/05/2012  . PE (pulmonary embolism)     Xarelto    Past Surgical History  Procedure Laterality Date  . Diagnostic laparoscopy    . Appendectomy      Current Outpatient Prescriptions  Medication Sig Dispense Refill  . ALPRAZolam (XANAX) 0.5 MG tablet Take 0.5 mg by mouth 2 (two) times daily as needed for anxiety.      Marland Kitchen. aspirin 325 MG tablet Take 325 mg by mouth daily.      . Calcium Carbonate-Vitamin D (CALCIUM 600 + D PO) Take 1 tablet by mouth daily.        . carvedilol (COREG) 3.125 MG tablet Take 3.125 mg by mouth 2 (two) times daily with a meal.      . fenofibrate (TRICOR) 145 MG tablet Take 145 mg by mouth daily.      Marland Kitchen. gabapentin (NEURONTIN) 600 MG tablet Take 600 mg by mouth 3 (three) times daily.      Marland Kitchen. glipiZIDE (GLUCOTROL) 10 MG tablet Take 10 mg by mouth 2 (two) times daily.      . Insulin Glargine (LANTUS [place]) Inject 20 mg into the skin daily.      . Insulin Lispro, Human, (HUMALOG [place]) Inject into the skin. Sliding Scale      . lisinopril (PRINIVIL,ZESTRIL) 5 MG tablet Take 5 mg by mouth daily.      . metFORMIN (GLUCOPHAGE) 1000 MG tablet Take 2,000 mg by mouth 2 (two) times daily with a meal.      . metoCLOPramide (REGLAN) 5 MG tablet Take 5 mg by mouth 4 (four) times daily.      . Multiple Vitamins-Minerals (COMPLETE MULTIVITAMIN/MINERAL PO) Take by mouth daily.      . ondansetron (ZOFRAN) 4 MG tablet Take 4 mg by mouth every 8 (eight) hours as needed for nausea or vomiting.      Marland Kitchen. oxyCODONE-acetaminophen (PERCOCET/ROXICET) 5-325 MG per tablet Take 1 tablet by mouth every 6 (six) hours as needed for severe  pain.      . ranitidine (ZANTAC) 150 MG tablet Take 150 mg by mouth 2 (two) times daily.      . rivaroxaban (XARELTO) 20 MG TABS tablet Take 20 mg by mouth every morning.      . sertraline (ZOLOFT) 100 MG tablet Take 100 mg by mouth daily.      . simvastatin (ZOCOR) 80 MG tablet Take 80 mg by mouth every evening.      . topiramate (TOPAMAX) 25 MG tablet Take 25 mg by mouth every evening.      Marland Kitchen. amoxicillin-clavulanate (AUGMENTIN) 875-125 MG per tablet Take 1 tablet by mouth 2 (two) times daily.  28 tablet  0  . medroxyPROGESTERone (DEPO-PROVERA) 150 MG/ML injection Inject 150 mg into the muscle every 3 (three) months.      . pantoprazole (PROTONIX) 40 MG tablet Take 1 tablet (40 mg total) by mouth daily.  30 tablet  3   No current facility-administered medications for this visit.    Allergies as of 01/30/2014  . (No Known  Allergies)    Family History  Problem Relation Age of Onset  . Colon cancer Neg Hx   . Liver disease Neg Hx     History   Social History  . Marital Status: Married    Spouse Name: N/A    Number of Children: N/A  . Years of Education: N/A   Occupational History  . Not on file.   Social History Main Topics  . Smoking status: Current Every Day Smoker -- 1.50 packs/day  . Smokeless tobacco: Not on file  . Alcohol Use: No  . Drug Use: No  . Sexual Activity: Yes   Other Topics Concern  . Not on file   Social History Narrative  . No narrative on file    Review of Systems: As mentioned in HPI.   Physical Exam: BP 108/65  Pulse 83  Temp(Src) 98.9 F (37.2 C) (Oral)  Resp 18  Ht 5\' 5"  (1.651 m)  Wt 212 lb (96.163 kg)  BMI 35.28 kg/m2 General:   Alert and oriented. Pleasant and cooperative. Well-nourished and well-developed.  Head:  Normocephalic and atraumatic. Eyes:  Without icterus, sclera clear and conjunctiva pink.  Ears:  Normal auditory acuity. Nose:  No deformity, discharge,  or lesions. Mouth:  No deformity or lesions, oral mucosa pink.  Lungs:  Clear to auscultation bilaterally. No wheezes, rales, or rhonchi. No distress.  Heart:  S1, S2 present without murmurs appreciated.  Abdomen:  +BS, soft, TTP RUQ but without peritoneal signs.  Obese.  No HSM noted. No guarding or rebound. No masses appreciated.  Rectal:  Deferred  Msk:  Symmetrical without gross deformities. Normal posture. Extremities:  Without clubbing or edema. Neurologic:  Alert and  oriented x4;  grossly normal neurologically. Skin:  Intact without significant lesions or rashes. Psych:  Alert and cooperative. Normal mood and affect.

## 2014-01-30 NOTE — Patient Instructions (Addendum)
Start taking Protonix each morning, 30 minutes before breakfast. You can take one dose of Zantac in the evening if needed.  We have scheduled you for a liver biopsy for further evaluation.  I have sent Augmentin to your pharmacy to take twice a day with food for 14 days. Please seek immediate medical attention if fever, chills, worsening abdominal pain, inability to tolerate any oral intake.  Please have blood work done today.

## 2014-01-31 DIAGNOSIS — R1011 Right upper quadrant pain: Secondary | ICD-10-CM | POA: Insufficient documentation

## 2014-01-31 LAB — HEPATIC FUNCTION PANEL
ALBUMIN: 4 g/dL (ref 3.5–5.2)
ALT: 31 U/L (ref 0–35)
AST: 28 U/L (ref 0–37)
Alkaline Phosphatase: 66 U/L (ref 39–117)
BILIRUBIN DIRECT: 0.1 mg/dL (ref 0.0–0.3)
Indirect Bilirubin: 0.2 mg/dL (ref 0.2–1.2)
TOTAL PROTEIN: 6.6 g/dL (ref 6.0–8.3)
Total Bilirubin: 0.3 mg/dL (ref 0.2–1.2)

## 2014-01-31 LAB — CBC
HEMATOCRIT: 36.8 % (ref 36.0–46.0)
HEMOGLOBIN: 12.2 g/dL (ref 12.0–15.0)
MCH: 30.1 pg (ref 26.0–34.0)
MCHC: 33.2 g/dL (ref 30.0–36.0)
MCV: 90.9 fL (ref 78.0–100.0)
Platelets: 246 10*3/uL (ref 150–400)
RBC: 4.05 MIL/uL (ref 3.87–5.11)
RDW: 15 % (ref 11.5–15.5)
WBC: 10.1 10*3/uL (ref 4.0–10.5)

## 2014-01-31 NOTE — Assessment & Plan Note (Signed)
Chronic, with associated N/V worsening. Incidental finding of enlarging liver lesion noted on recent CT at [facility], with subsequent MRI noting question of abscess due to characteristics. Afebrile at visit, although she does note occasional chills but has not taken her actual temperature. N/V for several months. Reviewed MRI with Dr. Tyron RussellBoles who favors an abscess; although it is not definite, and this will require ultrasound-guided percutaneous biopsy for definitive diagnosis.   Patient is non-toxic appearing. To be thorough, will empirically place on Augmentin. Arrange appt with IR as soon as possible. PATIENT IS ON XARELTO.  Start Protonix daily, Zantac in evening prn.

## 2014-02-01 NOTE — Progress Notes (Signed)
cc'd to pcp 

## 2014-02-05 DIAGNOSIS — F411 Generalized anxiety disorder: Secondary | ICD-10-CM | POA: Diagnosis not present

## 2014-02-05 DIAGNOSIS — I2699 Other pulmonary embolism without acute cor pulmonale: Secondary | ICD-10-CM | POA: Diagnosis not present

## 2014-02-05 DIAGNOSIS — E538 Deficiency of other specified B group vitamins: Secondary | ICD-10-CM | POA: Diagnosis not present

## 2014-02-05 DIAGNOSIS — IMO0001 Reserved for inherently not codable concepts without codable children: Secondary | ICD-10-CM | POA: Diagnosis not present

## 2014-02-05 DIAGNOSIS — I10 Essential (primary) hypertension: Secondary | ICD-10-CM | POA: Diagnosis not present

## 2014-02-05 DIAGNOSIS — M129 Arthropathy, unspecified: Secondary | ICD-10-CM | POA: Diagnosis not present

## 2014-02-05 DIAGNOSIS — K219 Gastro-esophageal reflux disease without esophagitis: Secondary | ICD-10-CM | POA: Diagnosis not present

## 2014-02-06 NOTE — Progress Notes (Signed)
Quick Note:  LFTs normal. CBC normal. How is patient doing? Appears she is scheduled with IR on 7/22 for percutaneous biopsy of ?Liver abscess.  ______

## 2014-02-06 NOTE — Progress Notes (Signed)
Quick Note:  I called and informed pt. She said she is still having a lot of nausea and some vomiting and some dry heaves. She just does not feel good. She is scheduled for the biopsy on 02/14/2014.  Please advise! ______

## 2014-02-07 DIAGNOSIS — IMO0001 Reserved for inherently not codable concepts without codable children: Secondary | ICD-10-CM | POA: Diagnosis not present

## 2014-02-13 ENCOUNTER — Other Ambulatory Visit: Payer: Self-pay | Admitting: Radiology

## 2014-02-13 NOTE — Progress Notes (Signed)
Quick Note:  Have her follow-up with us next week. If she has persistent symptoms after biopsy, may need to consider EGD. ______

## 2014-02-14 ENCOUNTER — Encounter (HOSPITAL_COMMUNITY): Payer: Self-pay

## 2014-02-14 ENCOUNTER — Ambulatory Visit (HOSPITAL_COMMUNITY)
Admission: RE | Admit: 2014-02-14 | Discharge: 2014-02-14 | Disposition: A | Discharge status: Home / Self Care | Payer: Medicare Other | Source: Ambulatory Visit | Attending: Gastroenterology | Admitting: Gastroenterology

## 2014-02-14 DIAGNOSIS — K750 Abscess of liver: Secondary | ICD-10-CM | POA: Diagnosis not present

## 2014-02-14 DIAGNOSIS — R109 Unspecified abdominal pain: Secondary | ICD-10-CM | POA: Insufficient documentation

## 2014-02-14 DIAGNOSIS — K7689 Other specified diseases of liver: Secondary | ICD-10-CM | POA: Diagnosis not present

## 2014-02-14 DIAGNOSIS — K7460 Unspecified cirrhosis of liver: Secondary | ICD-10-CM | POA: Diagnosis not present

## 2014-02-14 LAB — GLUCOSE, CAPILLARY
GLUCOSE-CAPILLARY: 206 mg/dL — AB (ref 70–99)
GLUCOSE-CAPILLARY: 178 mg/dL — AB (ref 70–99)

## 2014-02-14 LAB — CBC
HEMATOCRIT: 37.5 % (ref 36.0–46.0)
Hemoglobin: 12.1 g/dL (ref 12.0–15.0)
MCH: 31.3 pg (ref 26.0–34.0)
MCHC: 32.3 g/dL (ref 30.0–36.0)
MCV: 97.2 fL (ref 78.0–100.0)
Platelets: 224 10*3/uL (ref 150–400)
RBC: 3.86 MIL/uL — AB (ref 3.87–5.11)
RDW: 15.4 % (ref 11.5–15.5)
WBC: 9.1 10*3/uL (ref 4.0–10.5)

## 2014-02-14 LAB — PROTIME-INR
INR: 0.97 (ref 0.00–1.49)
Prothrombin Time: 12.9 seconds (ref 11.6–15.2)

## 2014-02-14 LAB — APTT: aPTT: 28 seconds (ref 24–37)

## 2014-02-14 MED ORDER — HYDROCODONE-ACETAMINOPHEN 5-325 MG PO TABS
1.0000 | ORAL_TABLET | ORAL | Status: DC | PRN
  Administered 2014-02-14: 2 via ORAL

## 2014-02-14 MED ORDER — FENTANYL CITRATE 0.05 MG/ML IJ SOLN
INTRAMUSCULAR | Status: AC
  Filled 2014-02-14: qty 2

## 2014-02-14 MED ORDER — MIDAZOLAM HCL 2 MG/2ML IJ SOLN
INTRAMUSCULAR | Status: AC | PRN
  Administered 2014-02-14: 0.5 mg via INTRAVENOUS
  Administered 2014-02-14: 1 mg via INTRAVENOUS

## 2014-02-14 MED ORDER — FENTANYL CITRATE 0.05 MG/ML IJ SOLN
INTRAMUSCULAR | Status: AC | PRN
  Administered 2014-02-14 (×3): 25 ug via INTRAVENOUS

## 2014-02-14 MED ORDER — MIDAZOLAM HCL 2 MG/2ML IJ SOLN
INTRAMUSCULAR | Status: AC
  Filled 2014-02-14: qty 2

## 2014-02-14 MED ORDER — HYDROCODONE-ACETAMINOPHEN 5-325 MG PO TABS
ORAL_TABLET | ORAL | Status: AC
  Filled 2014-02-14: qty 2

## 2014-02-14 MED ORDER — SODIUM CHLORIDE 0.9 % IV SOLN
INTRAVENOUS | Status: DC
  Administered 2014-02-14: 13:00:00 via INTRAVENOUS

## 2014-02-14 NOTE — Procedures (Signed)
US 17g FNA and 18g core biopsy R liver lesion 2ml purulent material for culture No complication No blood loss. [facility] dictation [facility].

## 2014-02-14 NOTE — Progress Notes (Signed)
Discharge instruction given to pt per MD order.  Pt to car via wheelchair.

## 2014-02-14 NOTE — Progress Notes (Addendum)
Pt stated that pain med was not effective for chronic right side upper abd pain.  Pt stated she take percocet at home.

## 2014-02-14 NOTE — Discharge Instructions (Signed)
Liver Biopsy, Care After °Refer to this sheet in the next few weeks. These discharge instructions provide you with general information on caring for yourself after you leave the hospital. Your caregiver may also give you specific instructions. Your treatment has been planned according to the most current medical practices available, but unavoidable complications sometimes occur. If you have any problems or questions after discharge, please call your caregiver. °HOME CARE INSTRUCTIONS  °· You should rest for 1 to 2 days or as instructed. °· If you go home the same day as your procedure (outpatient), have a responsible adult take you home and stay with you overnight. °· Do not lift more than 5 pounds or play contact sports for 2 weeks. °· Do not drive for 24 hours after this test. °· Do not take medicine containing aspirin or drink alcohol for 1 week after this test. °· Change bandages (dressings) as directed. °· Only take over-the-counter or prescription medicines for pain, discomfort, or fever as directed by your caregiver. °OBTAINING YOUR TEST RESULTS °Not all test results are available during your visit. If your test results are not back during the visit, make an appointment with your caregiver to find out the results. Do not assume everything is normal if you have not heard from your caregiver or the medical facility. It is important for you to follow up on all of your test results. °SEEK MEDICAL CARE IF:  °· You have increased bleeding (more than a small spot) from the biopsy site. °· You have redness, swelling, or increasing pain in the biopsy site. °· You have an oral temperature above 102° F (38.9° C). °SEEK IMMEDIATE MEDICAL CARE IF:  °· You develop swelling or pain in the belly (abdomen). °· You develop a rash. °· You have difficulty breathing, feel short of breath, or feel faint. °· You develop any reaction or side effects to medicines given. °MAKE SURE YOU:  °· Understand these instructions. °· Will watch  your condition. °· Will get help right away if you are not doing well or get worse. °Document Released: 01/30/2005 Document Revised: 07/18/2013 Document Reviewed: 02/23/2008 °ExitCare® Patient Information ©2015 [employer]. This information is not intended to replace advice given to you by your health care provider. Make sure you discuss any questions you have with your health care provider. ° °

## 2014-02-14 NOTE — Progress Notes (Signed)
Quick Note:  LMOM to call. ______ 

## 2014-02-14 NOTE — H&P (Signed)
[name] is an 43 y.o. female.   Chief Complaint: pt has suffered off and on abd pain x 1-2 yrs Was first seen in late 2014 by MD: all tests were neg per pt Was admitted to hospital 12/2013 for syncopal episode of unknown etiology Work up revealed incidental finding of liver lesion Compare to last CT does show same lesion - now enlarging Reading suggests probable fluid collection rather than mass Now scheduled for liver lesion biopsy vs aspiration  HPI: smoker; PVD; HTN; COPD- asthma; TB; GERD; DM; on Xarelto for PE( last dose 1 week ago)  Past Medical History  Diagnosis Date  . Peripheral vascular disease   . Hypertension   . Anginal pain   . Anxiety   . Depression   . Asthma   . COPD (chronic obstructive pulmonary disease)   . Shortness of breath   . Tuberculosis   . GERD (gastroesophageal reflux disease)   . Headache(784.0)   . Arthritis   . Diabetes mellitus 01/28/2012  . Anemia 02/05/2012  . PE (pulmonary embolism)     Xarelto    Past Surgical History  Procedure Laterality Date  . Diagnostic laparoscopy    . Appendectomy      Family History  Problem Relation Age of Onset  . Colon cancer Neg Hx   . Liver disease Neg Hx    Social History:  reports that she has been smoking.  She does not have any smokeless tobacco history on file. She reports that she does not drink alcohol or use illicit drugs.  Allergies: No Known Allergies   (Not in a hospital admission)  Results for orders placed during the hospital encounter of 02/14/14 (from the past 48 hour(s))  APTT     Status: None   Collection Time    02/14/14 12:53 PM      Result Value Ref Range   aPTT 28  24 - 37 seconds  CBC     Status: Abnormal   Collection Time    02/14/14 12:53 PM      Result Value Ref Range   WBC 9.1  4.0 - 10.5 K/uL   RBC 3.86 (*) 3.87 - 5.11 MIL/uL   Hemoglobin 12.1  12.0 - 15.0 g/dL   HCT 16.1  09.6 - 04.5 %   MCV 97.2  78.0 - 100.0 fL   MCH 31.3  26.0 - 34.0 pg   MCHC 32.3   30.0 - 36.0 g/dL   RDW 40.9  81.1 - 91.4 %   Platelets 224  150 - 400 K/uL  PROTIME-INR     Status: None   Collection Time    02/14/14 12:53 PM      Result Value Ref Range   Prothrombin Time 12.9  11.6 - 15.2 seconds   INR 0.97  0.00 - 1.49  GLUCOSE, CAPILLARY     Status: Abnormal   Collection Time    02/14/14  1:21 PM      Result Value Ref Range   Glucose-Capillary 206 (*) 70 - 99 mg/dL   Comment 1 Notify RN     Comment 2 Documented in Chart     No results found.  Review of Systems  Constitutional: Negative for fever and weight loss.  HENT: Negative for hearing loss.   Respiratory: Negative for shortness of breath.   Cardiovascular: Negative for chest pain.  Gastrointestinal: Positive for nausea and abdominal pain.  Musculoskeletal: Negative for back pain.  Neurological: Positive for weakness.  Psychiatric/Behavioral:  Positive for substance abuse.       Smoker    Blood pressure 118/69, pulse 80, temperature 98 F (36.7 C), temperature source Oral, resp. rate 20, height 5\' 6"  (1.676 m), weight 97.523 kg (215 lb), SpO2 98.00%. Physical Exam  Constitutional: She is oriented to person, place, and time. She appears well-nourished.  Cardiovascular: Normal rate and regular rhythm.   No murmur heard. Respiratory: Effort normal and breath sounds normal. She has no wheezes.  GI: Soft. Bowel sounds are normal. There is tenderness.  Musculoskeletal: Normal range of motion.  Neurological: She is alert and oriented to person, place, and time.  Skin: Skin is warm and dry.  Psychiatric: She has a normal mood and affect. Her behavior is normal. Judgment and thought content normal.     Assessment/Plan Liver lesion Fluid vs mass Enlarging on CT Now scheduled for bx vs aspiration Pt aware of procedure benefits and risks and agreeable to proceed Consent signed andin chart  Konni Kesinger A 02/14/2014, 2:07 PM

## 2014-02-15 NOTE — Progress Notes (Signed)
Patient ID: [name], female   DOB: [birth year], 43 y.o.   MRN: [id number]   Pts sister [name] has called to inform us about continued pain pt is having at bx site. [phone]  She gave me pts number: [phone]- lives in [place], [place] Pt states she has pain at liver bx site Seems worse than yesterday Unable to sleep due to pain- even with Percocet she has at home.  Denies N/V or fever Site is tender. Does not feel like pulse has changed Does not feel dizzy or light headed  Explained to pt she may have some discomfort from biopsy Could try cold pack to area But if she feels pain is that severe or worsening Rec: to ED either [facility] or here For evaluation  Pt has good understanding of plan Gave her PA desk at [facility] # if needs anything

## 2014-02-16 NOTE — Progress Notes (Signed)
Quick Note:  I called and informed pt. [name] will schedule her an appt for next week. ______

## 2014-02-17 LAB — CULTURE, ROUTINE-ABSCESS

## 2014-02-21 ENCOUNTER — Encounter: Payer: Self-pay | Admitting: Gastroenterology

## 2014-02-21 ENCOUNTER — Ambulatory Visit (INDEPENDENT_AMBULATORY_CARE_PROVIDER_SITE_OTHER): Payer: Medicare Other | Admitting: Gastroenterology

## 2014-02-21 VITALS — BP 100/67 | HR 85 | Temp 97.5°F | Ht 66.0 in | Wt 221.0 lb

## 2014-02-21 DIAGNOSIS — R1011 Right upper quadrant pain: Secondary | ICD-10-CM

## 2014-02-21 DIAGNOSIS — K750 Abscess of liver: Secondary | ICD-10-CM | POA: Diagnosis not present

## 2014-02-21 LAB — COMPREHENSIVE METABOLIC PANEL
ALBUMIN: 3.8 g/dL (ref 3.5–5.2)
ALT: 32 U/L (ref 0–35)
AST: 25 U/L (ref 0–37)
Alkaline Phosphatase: 91 U/L (ref 39–117)
BUN: 18 mg/dL (ref 6–23)
CO2: 23 mEq/L (ref 19–32)
Calcium: 9.7 mg/dL (ref 8.4–10.5)
Chloride: 101 mEq/L (ref 96–112)
Creat: 1.09 mg/dL (ref 0.50–1.10)
Glucose, Bld: 260 mg/dL — ABNORMAL HIGH (ref 70–99)
POTASSIUM: 4.8 meq/L (ref 3.5–5.3)
Sodium: 138 mEq/L (ref 135–145)
TOTAL PROTEIN: 6.8 g/dL (ref 6.0–8.3)
Total Bilirubin: 0.2 mg/dL — ABNORMAL LOW (ref 0.2–1.2)

## 2014-02-21 MED ORDER — FLUCONAZOLE 200 MG PO TABS: 200.0000 mg | ORAL_TABLET | Freq: Every day | ORAL | Status: DC

## 2014-02-21 MED ORDER — AMOXICILLIN-POT CLAVULANATE 875-125 MG PO TABS: 1.0000 | ORAL_TABLET | Freq: Two times a day (BID) | ORAL | Status: DC

## 2014-02-21 MED ORDER — PROMETHAZINE HCL 25 MG PO TABS: 12.5000 mg | ORAL_TABLET | Freq: Four times a day (QID) | ORAL | Status: DC | PRN

## 2014-02-21 MED ORDER — ONDANSETRON HCL 4 MG PO TABS: 4.0000 mg | ORAL_TABLET | Freq: Three times a day (TID) | ORAL | Status: DC

## 2014-02-21 NOTE — Progress Notes (Signed)
Referring Provider: Selinda FlavinHoward, Kevin, MD Primary Care Physician:  Selinda FlavinHOWARD, KEVIN, MD Primary GI: Dr. Darrick PennaFields   Chief Complaint  Patient presents with  . Follow-up    pt still having some right side pain most of the time    HPI:   Claiborne BillingsKaren D Eberlin presents today in close follow-up after recent ultrasound guided liver abscess aspiration and biopsy. Please see prior office visit note from 7/7 for full history. Cultures revealed few candida albicans.  N/V improved. Still feels nauseated the majority of the time. Vomiting improved. None in a few days. Right-sided abdominal pain, around site of biopsy. Constant. Laying on it makes it worse. Turning a certain way worsens. Had been placed on course of Augmentin empirically at last visit due to symptoms of significant pain, N/V, reported fever.   Past Medical History  Diagnosis Date  . Peripheral vascular disease   . Hypertension   . Anginal pain   . Anxiety   . Depression   . Asthma   . COPD (chronic obstructive pulmonary disease)   . Shortness of breath   . Tuberculosis   . GERD (gastroesophageal reflux disease)   . Headache(784.0)   . Arthritis   . Diabetes mellitus 01/28/2012  . Anemia 02/05/2012  . PE (pulmonary embolism)     Xarelto    Past Surgical History  Procedure Laterality Date  . Diagnostic laparoscopy    . Appendectomy      Current Outpatient Prescriptions  Medication Sig Dispense Refill  . ALPRAZolam (XANAX) 0.5 MG tablet Take 0.5 mg by mouth 2 (two) times daily as needed for anxiety.      Marland Kitchen. aspirin 325 MG tablet Take 325 mg by mouth daily.      . Calcium Carbonate-Vitamin D (CALCIUM 600 + D PO) Take 1 tablet by mouth daily.       . carvedilol (COREG) 3.125 MG tablet Take 3.125 mg by mouth 2 (two) times daily with a meal.      . fenofibrate (TRICOR) 145 MG tablet Take 145 mg by mouth daily.      Marland Kitchen. gabapentin (NEURONTIN) 600 MG tablet Take 600 mg by mouth 3 (three) times daily.      Marland Kitchen. glipiZIDE (GLUCOTROL) 10 MG  tablet Take 10 mg by mouth 2 (two) times daily.      . insulin glargine (LANTUS) 100 UNIT/ML injection Inject 20 Units into the skin every morning.      . insulin lispro (HUMALOG) 100 UNIT/ML injection Inject 2-6 Units into the skin 3 (three) times daily before meals. *per sliding scale*      . lisinopril (PRINIVIL,ZESTRIL) 5 MG tablet Take 5 mg by mouth every morning.       . metFORMIN (GLUCOPHAGE-XR) 500 MG 24 hr tablet Take 1,000 mg by mouth 2 (two) times daily.      . metoCLOPramide (REGLAN) 5 MG tablet Take 5 mg by mouth 4 (four) times daily as needed for nausea or vomiting.       . Multiple Vitamins-Minerals (COMPLETE MULTIVITAMIN/MINERAL PO) Take 1 tablet by mouth daily.       . ondansetron (ZOFRAN) 4 MG tablet Take 1 tablet (4 mg total) by mouth 3 (three) times daily with meals.  90 tablet  3  . oxyCODONE-acetaminophen (PERCOCET/ROXICET) 5-325 MG per tablet Take 1 tablet by mouth every 6 (six) hours as needed for severe pain.      . pantoprazole (PROTONIX) 40 MG tablet Take 40 mg by mouth every  morning.      . ranitidine (ZANTAC) 300 MG tablet Take 300 mg by mouth at bedtime as needed for heartburn. *takes if symptoms not relieved by protonix*      . rivaroxaban (XARELTO) 20 MG TABS tablet Take 20 mg by mouth every morning.      . sertraline (ZOLOFT) 100 MG tablet Take 100 mg by mouth daily.      . simvastatin (ZOCOR) 80 MG tablet Take 80 mg by mouth every evening.      . topiramate (TOPAMAX) 25 MG tablet Take 25 mg by mouth every evening.      Marland Kitchen amoxicillin-clavulanate (AUGMENTIN) 875-125 MG per tablet Take 1 tablet by mouth 2 (two) times daily.  28 tablet  0  . fluconazole (DIFLUCAN) 200 MG tablet Take 1 tablet (200 mg total) by mouth daily. Take 4 tablets on day one, then 2 tablets for the next 21 days.  48 tablet  0  . promethazine (PHENERGAN) 25 MG tablet Take 0.5 tablets (12.5 mg total) by mouth every 6 (six) hours as needed for nausea or vomiting.  30 tablet  3   No current  facility-administered medications for this visit.    Allergies as of 02/21/2014  . (No Known Allergies)    Family History  Problem Relation Age of Onset  . Colon cancer Neg Hx   . Liver disease Neg Hx     History   Social History  . Marital Status: Single    Spouse Name: N/A    Number of Children: N/A  . Years of Education: N/A   Social History Main Topics  . Smoking status: Current Every Day Smoker -- 1.50 packs/day  . Smokeless tobacco: None     Comment: 1/2 to one pack a day  . Alcohol Use: No  . Drug Use: No  . Sexual Activity: Yes   Other Topics Concern  . None   Social History Narrative  . None    Review of Systems: As mentioned in HPI  Physical Exam: BP 100/67  Pulse 85  Temp(Src) 97.5 F (36.4 C) (Oral)  Ht 5\' 6"  (1.676 m)  Wt 221 lb (100.245 kg)  BMI 35.69 kg/m2 General:   Alert and oriented. No distress noted. Pleasant and cooperative.  Head:  Normocephalic and atraumatic. Eyes:  Conjuctiva clear without scleral icterus. Mouth:  Oral mucosa pink and moist. Good dentition. No lesions. Heart:  S1, S2 present without murmurs, rubs, or gallops. Regular rate and rhythm. Abdomen:  +BS, soft, tender to palpation right-sided abdomen with only light palpation, out of proportion. +carnett's sign.  Msk:  Symmetrical without gross deformities. Normal posture. Extremities:  Without edema. Neurologic:  Alert and  oriented x4;  grossly normal neurologically. Skin:  Intact without significant lesions or rashes. Psych:  Alert and cooperative. Normal mood and affect.

## 2014-02-21 NOTE — Patient Instructions (Addendum)
Go to the lab and have blood cultures drawn. Make sure they draw from 2 different sites (both arms for example).   Do not take the medication (Diflucan) until I call you with the results of your kidney function. We need to make sure the dosing is right for this. However, you may start taking Augmentin now.   When it is time to start Diflucan, you will take 4 tablets the first day, then 2 tablets for 21 more days. Again, do not take Diflucan until we call you. DO NOT TAKE YOUR CHOLESTEROL MEDICATION (ZOCOR and TRICOR) WHILE TAKING DIFLUCAN.   We will be in touch with the results of the blood cultures as soon as they are available.   I have sent Zofran, a nausea medication, to the pharmacy to take with your meals. I have also sent Phenergan to take 1/2 tablet as needed for severe nausea.

## 2014-02-21 NOTE — Assessment & Plan Note (Addendum)
Interesting case with cultures revealing candida albicans. Was on Augmentin empirically prior to biopsy, so unable to completely rule out bacterial involvement. Improvement in symptoms of N/V but with persistent abdominal pain. +Carnett's sign on exam. Doubt current clinical scenario of liver abscess completely explaining abdominal pain.   Spoke with Dr. Orvan Falconerampbell with ID at [id number]. Recommended routine blood cultures X 2, speciate organisms, obtain susceptibility. Then start loading dose of Diflucan 800 mg followed by 400 mg daily for a few weeks. Remain on Augmentin. Tailor therapy once cultures back. Reimage if clinically indicated. Await blood cultures now. Patient appears to be stable, non-toxic appearing. Appropriate for outpatient management.

## 2014-02-22 NOTE — Progress Notes (Signed)
Quick Note:  Glucose elevated.  LFTs normal. Good renal function.  Go ahead and start Diflucan and Augmentin. Blood cultures pending. ______

## 2014-02-27 LAB — CULTURE, BLOOD (SINGLE): ORGANISM ID, BACTERIA: NO GROWTH

## 2014-02-27 NOTE — Progress Notes (Signed)
Quick Note:  Looks like we are missing the second blood culture? No growth thus far. Complete Diflucan and Augmentin as previously instructed. Let's repeat MRI in 6 months. ______

## 2014-02-27 NOTE — Progress Notes (Signed)
Cc to PCP 

## 2014-03-07 ENCOUNTER — Telehealth: Payer: Self-pay | Admitting: Gastroenterology

## 2014-03-07 DIAGNOSIS — I2699 Other pulmonary embolism without acute cor pulmonale: Secondary | ICD-10-CM | POA: Diagnosis not present

## 2014-03-07 DIAGNOSIS — IMO0001 Reserved for inherently not codable concepts without codable children: Secondary | ICD-10-CM | POA: Diagnosis not present

## 2014-03-07 DIAGNOSIS — K219 Gastro-esophageal reflux disease without esophagitis: Secondary | ICD-10-CM | POA: Diagnosis not present

## 2014-03-07 DIAGNOSIS — I10 Essential (primary) hypertension: Secondary | ICD-10-CM | POA: Diagnosis not present

## 2014-03-07 DIAGNOSIS — M129 Arthropathy, unspecified: Secondary | ICD-10-CM | POA: Diagnosis not present

## 2014-03-07 DIAGNOSIS — F411 Generalized anxiety disorder: Secondary | ICD-10-CM | POA: Diagnosis not present

## 2014-03-07 DIAGNOSIS — F3289 Other specified depressive episodes: Secondary | ICD-10-CM | POA: Diagnosis not present

## 2014-03-07 DIAGNOSIS — R109 Unspecified abdominal pain: Secondary | ICD-10-CM | POA: Diagnosis not present

## 2014-03-07 DIAGNOSIS — F329 Major depressive disorder, single episode, unspecified: Secondary | ICD-10-CM | POA: Diagnosis not present

## 2014-03-07 NOTE — Telephone Encounter (Signed)
AS was already aware of the blood culture that was printed and showed to me today. She had ordered TWO culture that day but looks like only one was done. Please verify with the lab.

## 2014-03-07 NOTE — Telephone Encounter (Signed)
I have printed out the blood culture results and showed them to LSL and put the results in AS box.  Routing to DS and LSL

## 2014-03-07 NOTE — Telephone Encounter (Signed)
Pt called asking for JL. JL not available to speak with patient. Patient was wanting her results from her labs. Looks like she is a SF and don't know why she was asking for JL. JL said that she would have to call lab to see if they would send us the results so LSL could review them since AS is not here this week. Patient said if we get this done today to call [phone] or you can call her home number tomorrow at [phone]

## 2014-03-08 NOTE — Telephone Encounter (Signed)
Routing to DS to follow up on labs.

## 2014-03-09 NOTE — Telephone Encounter (Signed)
I called [facility] and spoke to WanetteNeka who said that 2 cultures were done and she will fax the results of both.

## 2014-03-09 NOTE — Telephone Encounter (Signed)
Routing the info to Tana CoastLeslie Lewis, [place].

## 2014-03-12 NOTE — Telephone Encounter (Signed)
I placed on Anna's desk since she is back now.

## 2014-03-13 ENCOUNTER — Telehealth: Payer: Self-pay | Admitting: *Deleted

## 2014-03-13 NOTE — Telephone Encounter (Signed)
Pt called to get her results. Please advise  

## 2014-03-14 NOTE — Telephone Encounter (Signed)
Cultures negative.   She should be finished with Augmentin and Diflucan. How is she doing?

## 2014-03-15 NOTE — Telephone Encounter (Signed)
Please see addendum to my office note.

## 2014-03-15 NOTE — Progress Notes (Signed)
LMOM to call.

## 2014-03-15 NOTE — Telephone Encounter (Signed)
Pt is aware of results. She did finish the medication. She is still hurting on her side. When she burps it taste like boiled eggs. Please advise

## 2014-03-15 NOTE — Progress Notes (Signed)
She has been treated appropriately for liver abscess. No growth on blood cultures. With her persistent symptoms, will need EGD with Dr. Darrick PennaFields. HOWEVER, she is on Xarelto. Will need to have this held, but we NEED TO CHECK WITH CARDIOLOGY FIRST. Anticipate holding 3 days prior. Will also likely need with Propofol. Ultimately needs office visit with us to set all this up.

## 2014-03-19 NOTE — Progress Notes (Signed)
Pt called and was informed. OV with Gerrit Halls, NP on 04/18/2014 at 10:30 AM.

## 2014-04-18 ENCOUNTER — Ambulatory Visit (INDEPENDENT_AMBULATORY_CARE_PROVIDER_SITE_OTHER): Payer: Medicare Other | Admitting: Gastroenterology

## 2014-04-18 ENCOUNTER — Encounter (INDEPENDENT_AMBULATORY_CARE_PROVIDER_SITE_OTHER): Payer: Self-pay

## 2014-04-18 ENCOUNTER — Encounter: Payer: Self-pay | Admitting: Gastroenterology

## 2014-04-18 ENCOUNTER — Telehealth: Payer: Self-pay | Admitting: Gastroenterology

## 2014-04-18 VITALS — BP 102/70 | HR 84 | Temp 97.4°F | Ht 66.0 in | Wt 214.0 lb

## 2014-04-18 DIAGNOSIS — K750 Abscess of liver: Secondary | ICD-10-CM

## 2014-04-18 DIAGNOSIS — R1011 Right upper quadrant pain: Secondary | ICD-10-CM | POA: Diagnosis not present

## 2014-04-18 DIAGNOSIS — K5900 Constipation, unspecified: Secondary | ICD-10-CM

## 2014-04-18 MED ORDER — LUBIPROSTONE 8 MCG PO CAPS: 8.0000 ug | ORAL_CAPSULE | Freq: Two times a day (BID) | ORAL | Status: DC

## 2014-04-18 NOTE — Patient Instructions (Signed)
Continue Zofran scheduled with meals. Continue Protonix each morning, 30 minutes before breakfast.   For constipation: start taking Amitiza 1 gelcap WITH FOOD to avoid nausea twice a day. I have provided samples and a prescription.   I recommend an upper endoscopy with dilation in the near future. Please call me in the next few days with a decision. It will need to be done within the next 30 days.   We will see you back in 3 months regardless. Review the high fiber diet handout attached. Make sure you are drinking plenty of water daily.   High-Fiber Diet Fiber is found in fruits, vegetables, and grains. A high-fiber diet encourages the addition of more whole grains, legumes, fruits, and vegetables in your diet. The recommended amount of fiber for adult males is 38 g per day. For adult females, it is 25 g per day. Pregnant and lactating women should get 28 g of fiber per day. If you have a digestive or bowel problem, ask your caregiver for advice before adding high-fiber foods to your diet. Eat a variety of high-fiber foods instead of only a select few type of foods.  PURPOSE  To increase stool bulk.  To make bowel movements more regular to prevent constipation.  To lower cholesterol.  To prevent overeating. WHEN IS THIS DIET USED?  It may be used if you have constipation and hemorrhoids.  It may be used if you have uncomplicated diverticulosis (intestine condition) and irritable bowel syndrome.  It may be used if you need help with weight management.  It may be used if you want to add it to your diet as a protective measure against atherosclerosis, diabetes, and cancer. SOURCES OF FIBER  Whole-grain breads and cereals.  Fruits, such as apples, oranges, bananas, berries, prunes, and pears.  Vegetables, such as green peas, carrots, sweet potatoes, beets, broccoli, cabbage, spinach, and artichokes.  Legumes, such split peas, soy, lentils.  Almonds. FIBER CONTENT IN FOODS Starches  and Grains / Dietary Fiber (g)  Cheerios, 1 cup / 3 g  Corn Flakes cereal, 1 cup / 0.7 g  Rice crispy treat cereal, 1 cup / 0.3 g  Instant oatmeal (cooked),  cup / 2 g  Frosted wheat cereal, 1 cup / 5.1 g  Brown, long-grain rice (cooked), 1 cup / 3.5 g  White, long-grain rice (cooked), 1 cup / 0.6 g  Enriched macaroni (cooked), 1 cup / 2.5 g Legumes / Dietary Fiber (g)  Baked beans (canned, plain, or vegetarian),  cup / 5.2 g  Kidney beans (canned),  cup / 6.8 g  Pinto beans (cooked),  cup / 5.5 g Breads and Crackers / Dietary Fiber (g)  Plain or honey graham crackers, 2 squares / 0.7 g  Saltine crackers, 3 squares / 0.3 g  Plain, salted pretzels, 10 pieces / 1.8 g  Whole-wheat bread, 1 slice / 1.9 g  White bread, 1 slice / 0.7 g  Raisin bread, 1 slice / 1.2 g  Plain bagel, 3 oz / 2 g  Flour tortilla, 1 oz / 0.9 g  Corn tortilla, 1 small / 1.5 g  Hamburger or hotdog bun, 1 small / 0.9 g Fruits / Dietary Fiber (g)  Apple with skin, 1 medium / 4.4 g  Sweetened applesauce,  cup / 1.5 g  Banana,  medium / 1.5 g  Grapes, 10 grapes / 0.4 g  Orange, 1 small / 2.3 g  Raisin, 1.5 oz / 1.6 g  Melon, 1 cup /  1.4 g Vegetables / Dietary Fiber (g)  Green beans (canned),  cup / 1.3 g  Carrots (cooked),  cup / 2.3 g  Broccoli (cooked),  cup / 2.8 g  Peas (cooked),  cup / 4.4 g  Mashed potatoes,  cup / 1.6 g  Lettuce, 1 cup / 0.5 g  Corn (canned),  cup / 1.6 g  Tomato,  cup / 1.1 g Document Released: 07/13/2005 Document Revised: 01/12/2012 Document Reviewed: 10/15/2011 ExitCare Patient Information 2015 [place], [place]. This information is not intended to replace advice given to you by your health care provider. Make sure you discuss any questions you have with your health care provider.

## 2014-04-18 NOTE — Progress Notes (Signed)
Referring Provider: Selinda Flavin, MD Primary Care Physician:  Selinda Flavin, MD Primary GI: Dr. Darrick Penna    Chief Complaint  Patient presents with  . Emesis    not vomiting as much for the last 2 weeks    HPI:   [name] presents with history of chronic RUQ pain since appendectomy several years ago, incidentally found to have an enlarging liver lesion on CT several months ago. MRI then completed showing a small hepatic abscess and markedly fatty liver. Sent for liver abscess biopsy with cultures revealing candida albicans. Discussed with ID, who recommended continuing Augmentin, start Diflucan, and proceed with blood cultures. Blood cultures negative X 2. She presents today to discuss possible EGD due to persistent abdominal pain and N/V.   No vomiting in 2 weeks but some nausea. RUQ pain constant. Not aggravated by eating. No fever/chills. On Protonix daily. Zantac each evening. Some breakthrough reflux occasionally. Occasional solid food and liquid dysphagia. No melena, no hematochezia. Small amount of constipation. BM daily to every other day. Sometimes feels like it is a hard time sometimes to get out. Soft stool.   On Xarelto. Managed by Dr. Dimas Aguas.    Past Medical History  Diagnosis Date  . Peripheral vascular disease   . Hypertension   . Anginal pain   . Anxiety   . Depression   . Asthma   . COPD (chronic obstructive pulmonary disease)   . Shortness of breath   . Tuberculosis   . GERD (gastroesophageal reflux disease)   . Headache(784.0)   . Arthritis   . Diabetes mellitus 01/28/2012  . Anemia 02/05/2012  . PE (pulmonary embolism)     Xarelto    Past Surgical History  Procedure Laterality Date  . Diagnostic laparoscopy    . Appendectomy      Current Outpatient Prescriptions  Medication Sig Dispense Refill  . ALPRAZolam (XANAX) 0.5 MG tablet Take 0.5 mg by mouth 2 (two) times daily as needed for anxiety.      Marland Kitchen aspirin 325 MG tablet Take 325 mg by mouth  daily.      . Calcium Carbonate-Vitamin D (CALCIUM 600 + D PO) Take 1 tablet by mouth daily.       . carvedilol (COREG) 3.125 MG tablet Take 3.125 mg by mouth 2 (two) times daily with a meal.      . fenofibrate (TRICOR) 145 MG tablet Take 145 mg by mouth daily.      Marland Kitchen gabapentin (NEURONTIN) 600 MG tablet Take 600 mg by mouth 3 (three) times daily.      Marland Kitchen glipiZIDE (GLUCOTROL) 10 MG tablet Take 10 mg by mouth 2 (two) times daily.      . insulin glargine (LANTUS) 100 UNIT/ML injection Inject 20 Units into the skin every morning.      . insulin lispro (HUMALOG) 100 UNIT/ML injection Inject 2-6 Units into the skin 3 (three) times daily before meals. *per sliding scale*      . lisinopril (PRINIVIL,ZESTRIL) 5 MG tablet Take 5 mg by mouth every morning.       . metFORMIN (GLUCOPHAGE-XR) 500 MG 24 hr tablet Take 1,000 mg by mouth 2 (two) times daily.      . Multiple Vitamins-Minerals (COMPLETE MULTIVITAMIN/MINERAL PO) Take 1 tablet by mouth daily.       . ondansetron (ZOFRAN) 4 MG tablet Take 1 tablet (4 mg total) by mouth 3 (three) times daily with meals.  90 tablet  3  . oxyCODONE-acetaminophen (PERCOCET/ROXICET)  5-325 MG per tablet Take 1 tablet by mouth every 6 (six) hours as needed for severe pain.      . pantoprazole (PROTONIX) 40 MG tablet Take 40 mg by mouth every morning.      . promethazine (PHENERGAN) 25 MG tablet Take 0.5 tablets (12.5 mg total) by mouth every 6 (six) hours as needed for nausea or vomiting.  30 tablet  3  . ranitidine (ZANTAC) 300 MG tablet Take 300 mg by mouth at bedtime as needed for heartburn. *takes if symptoms not relieved by protonix*      . rivaroxaban (XARELTO) 20 MG TABS tablet Take 20 mg by mouth every morning.      . sertraline (ZOLOFT) 100 MG tablet Take 100 mg by mouth daily.      . simvastatin (ZOCOR) 80 MG tablet Take 80 mg by mouth every evening.      . topiramate (TOPAMAX) 25 MG tablet Take 25 mg by mouth every evening.       No current facility-administered  medications for this visit.    Allergies as of 04/18/2014  . (No Known Allergies)    Family History  Problem Relation Age of Onset  . Colon cancer Neg Hx   . Liver disease Neg Hx     History   Social History  . Marital Status: Single    Spouse Name: N/A    Number of Children: N/A  . Years of Education: N/A   Social History Main Topics  . Smoking status: Current Every Day Smoker -- 1.50 packs/day  . Smokeless tobacco: None     Comment: 1/2 to one pack a day  . Alcohol Use: No  . Drug Use: No  . Sexual Activity: Yes   Other Topics Concern  . None   Social History Narrative  . None    Review of Systems: As mentioned in HPI  Physical Exam: BP 102/70  Pulse 84  Temp(Src) 97.4 F (36.3 C) (Oral)  Ht  (1.676 m)  Wt 214 lb (97.07 kg)  BMI 34.56 kg/m2 General:   Alert and oriented. No distress noted. Pleasant and cooperative.  Head:  Normocephalic and atraumatic. Eyes:  Conjuctiva clear without scleral icterus. Mouth:  Oral mucosa pink and moist. Good dentition. No lesions. Heart:  S1, S2 present without murmurs, rubs, or gallops. Regular rate and rhythm. Abdomen:  +BS, soft, TTP with light touch RUQ, +CARNETT'S SIGN and non-distended. No rebound or guarding. No HSM or masses noted. Msk:  Symmetrical without gross deformities. Normal posture. Extremities:  Without edema. Neurologic:  Alert and  oriented x4;  grossly normal neurologically. Psych:  Alert and cooperative. Normal mood and affect.

## 2014-04-18 NOTE — Telephone Encounter (Signed)
PATIENT NEEDS REPEAT MRI LATE OCT 2015

## 2014-04-19 ENCOUNTER — Telehealth: Payer: Self-pay | Admitting: Gastroenterology

## 2014-04-19 ENCOUNTER — Other Ambulatory Visit: Payer: Self-pay

## 2014-04-19 DIAGNOSIS — K769 Liver disease, unspecified: Secondary | ICD-10-CM

## 2014-04-19 NOTE — Telephone Encounter (Signed)
PATIENT CALLED AGAIN STATING THAT SHE WANTS HER MRI IN [place] .

## 2014-04-19 NOTE — Telephone Encounter (Signed)
LMOM to call back

## 2014-04-19 NOTE — Telephone Encounter (Signed)
Order has been faxed to [facility] and she is scheduled for 05/14/14 @ 10:00 am. Pt is aware of appointment and time.

## 2014-04-19 NOTE — Telephone Encounter (Signed)
Noted. I will send the order to [facility]

## 2014-04-19 NOTE — Telephone Encounter (Signed)
PATIENT RETURNED CALL, PLEASE CALL BACK  °

## 2014-04-20 DIAGNOSIS — K5900 Constipation, unspecified: Secondary | ICD-10-CM | POA: Insufficient documentation

## 2014-04-20 NOTE — Assessment & Plan Note (Signed)
Culture revealing candida albicans. No septicemia, with blood cultures negative X 2. Completed course of Diflucan and Augmentin as per ID recommendations. Needs repeat MRI liver next month to document resolution.

## 2014-04-20 NOTE — Assessment & Plan Note (Signed)
No rectal bleeding. Start Amitiza po BID. 3 month return.

## 2014-04-20 NOTE — Assessment & Plan Note (Signed)
43 year old female with chronic RUQ since appendectomy several years ago, associated N/V but improved since scheduled Zofran. Intermittent solid food and liquid dysphagia. Although liver abscess incidentally noted, this does not completely explain all of her symptoms. With +Carnett's sign, query abdominal wall pain. With persistent symptoms, would proceed with EGD and dilation to assess for gastritis, PUD. Dysphagia differentials include uncontrolled GERD, web, ring, stricture. HOWEVER, patient is hesitant to pursue an EGD due to fear of sedation. She has requested to consult with her sister first. Patient is on XARELTO. This would need to be held prior to procedure. In interim while patient decides, continue Protonix each morning, Zofran scheduled with meals.

## 2014-04-26 NOTE — Progress Notes (Signed)
cc'ed to pcp °

## 2014-05-14 DIAGNOSIS — K769 Liver disease, unspecified: Secondary | ICD-10-CM | POA: Diagnosis not present

## 2014-05-14 DIAGNOSIS — K7689 Other specified diseases of liver: Secondary | ICD-10-CM | POA: Diagnosis not present

## 2014-05-23 ENCOUNTER — Telehealth: Payer: Self-pay | Admitting: Gastroenterology

## 2014-05-23 NOTE — Telephone Encounter (Signed)
I spoke to pt and she would like these results when available. I am having Darl PikesSusan get the results from [facility].

## 2014-05-23 NOTE — Telephone Encounter (Signed)
PATIENT CALLED UPSET STATING THAT SHE WANTS HER RESULTS FROM HER MRI

## 2014-05-24 ENCOUNTER — Telehealth: Payer: Self-pay | Admitting: Gastroenterology

## 2014-05-24 NOTE — Telephone Encounter (Signed)
Patient has called several times today, but this time she said that DS had called and she was returning her call. Please call her at [phone]

## 2014-05-24 NOTE — Telephone Encounter (Signed)
Pt was informed of the results.

## 2014-05-24 NOTE — Telephone Encounter (Signed)
LMOM to call.

## 2014-05-24 NOTE — Telephone Encounter (Signed)
Great news! MRI reviewed and there is almost complete resolution of liver abscess. Will have it scanned in.

## 2014-05-24 NOTE — Telephone Encounter (Signed)
I have returned pt's call and informed of the MRI results.

## 2014-05-24 NOTE — Telephone Encounter (Signed)
We received [facility] and I am giving them to Gerrit HallsAnna Sams, NP to sign off on in Dr. Evelina DunField's absence.

## 2014-06-05 DIAGNOSIS — Z23 Encounter for immunization: Secondary | ICD-10-CM | POA: Diagnosis not present

## 2014-06-05 DIAGNOSIS — E1165 Type 2 diabetes mellitus with hyperglycemia: Secondary | ICD-10-CM | POA: Diagnosis not present

## 2014-06-05 DIAGNOSIS — E1140 Type 2 diabetes mellitus with diabetic neuropathy, unspecified: Secondary | ICD-10-CM | POA: Diagnosis not present

## 2014-06-05 DIAGNOSIS — F419 Anxiety disorder, unspecified: Secondary | ICD-10-CM | POA: Diagnosis not present

## 2014-06-05 DIAGNOSIS — E1143 Type 2 diabetes mellitus with diabetic autonomic (poly)neuropathy: Secondary | ICD-10-CM | POA: Diagnosis not present

## 2014-06-05 DIAGNOSIS — G4489 Other headache syndrome: Secondary | ICD-10-CM | POA: Diagnosis not present

## 2014-06-05 DIAGNOSIS — F328 Other depressive episodes: Secondary | ICD-10-CM | POA: Diagnosis not present

## 2014-07-16 ENCOUNTER — Ambulatory Visit: Payer: Medicare Other | Admitting: Gastroenterology

## 2014-08-08 DIAGNOSIS — I739 Peripheral vascular disease, unspecified: Secondary | ICD-10-CM | POA: Diagnosis not present

## 2014-08-08 DIAGNOSIS — Z9889 Other specified postprocedural states: Secondary | ICD-10-CM | POA: Diagnosis not present

## 2014-08-08 DIAGNOSIS — Z09 Encounter for follow-up examination after completed treatment for conditions other than malignant neoplasm: Secondary | ICD-10-CM | POA: Diagnosis not present

## 2014-08-24 ENCOUNTER — Ambulatory Visit (INDEPENDENT_AMBULATORY_CARE_PROVIDER_SITE_OTHER): Payer: Medicare Other | Admitting: Gastroenterology

## 2014-08-24 ENCOUNTER — Encounter: Payer: Self-pay | Admitting: Gastroenterology

## 2014-08-24 ENCOUNTER — Other Ambulatory Visit: Payer: Self-pay

## 2014-08-24 VITALS — BP 136/87 | HR 107 | Temp 97.4°F | Ht 66.0 in | Wt 202.4 lb

## 2014-08-24 DIAGNOSIS — R1011 Right upper quadrant pain: Secondary | ICD-10-CM

## 2014-08-24 DIAGNOSIS — R1314 Dysphagia, pharyngoesophageal phase: Secondary | ICD-10-CM

## 2014-08-24 DIAGNOSIS — K5900 Constipation, unspecified: Secondary | ICD-10-CM | POA: Diagnosis not present

## 2014-08-24 NOTE — Patient Instructions (Signed)
We will ask Dr. Dimas AguasHoward if it's ok to stop the Xarelto for 3 days prior to the procedure.  We have set you up for an upper endoscopy and possible dilation with Dr. Darrick PennaFields in the near future.

## 2014-08-24 NOTE — Progress Notes (Signed)
Referring Provider: Selinda FlavinHoward, Kevin, MD Primary Care Physician:  Selinda FlavinHOWARD, KEVIN, MD Primary GI: Dr. Darrick PennaFields    Chief Complaint  Patient presents with  . Follow-up    HPI:   Sharon BillingsKaren D [name] presents with history of chronic RUQ pain since appendectomy several years ago, incidentally found to have an enlarging liver lesion on CT several months ago. MRI then completed showing a small hepatic abscess and markedly fatty liver. Sent for liver abscess biopsy with cultures revealing candida albicans. Discussed with ID, who recommended continuing Augmentin, start Diflucan, and proceed with blood cultures. Blood cultures negative X 2. Repeat MRI in Oct 2015 with almost complete resolution of liver abscess. She presents today to discuss possible EGD due to persistent abdominal pain and N/V. Started Amitiza 8 mcg BID in Sept 2015.   Chronic intermittent nausea, vomiting/gagging. Nausea most days of the week. RUQ pain constant. No aggravating or relieving factors. Not much of an appetite. If eats without drinking, may have solid food dysphagia. Protonix once daily. Zantac as needed at night. No melena. No rectal bleeding. Not taking Amitiza but notes improvement in constipation symptoms.   On Xarelto due to history of PE. Managed by Dr. Dimas AguasHoward.    Past Medical History  Diagnosis Date  . Peripheral vascular disease   . Hypertension   . Anginal pain   . Anxiety   . Depression   . Asthma   . COPD (chronic obstructive pulmonary disease)   . Shortness of breath   . Tuberculosis   . GERD (gastroesophageal reflux disease)   . Headache(784.0)   . Arthritis   . Diabetes mellitus 01/28/2012  . Anemia 02/05/2012  . PE (pulmonary embolism)     Xarelto    Past Surgical History  Procedure Laterality Date  . Diagnostic laparoscopy    . Appendectomy    . Cholecystectomy      Current Outpatient Prescriptions  Medication Sig Dispense Refill  . ALPRAZolam (XANAX) 0.5 MG tablet Take 0.5 mg by mouth 2  (two) times daily as needed for anxiety.    Marland Kitchen. aspirin 325 MG tablet Take 325 mg by mouth daily.    . Calcium Carbonate-Vitamin D (CALCIUM 600 + D PO) Take 1 tablet by mouth daily.     . carvedilol (COREG) 3.125 MG tablet Take 3.125 mg by mouth 2 (two) times daily with a meal.    . fenofibrate (TRICOR) 145 MG tablet Take 145 mg by mouth daily.    Marland Kitchen. gabapentin (NEURONTIN) 600 MG tablet Take 600 mg by mouth 3 (three) times daily.    Marland Kitchen. glipiZIDE (GLUCOTROL) 10 MG tablet Take 10 mg by mouth 2 (two) times daily.    . insulin glargine (LANTUS) 100 UNIT/ML injection Inject 20 Units into the skin every morning.    . insulin lispro (HUMALOG) 100 UNIT/ML injection Inject 2-6 Units into the skin 3 (three) times daily before meals. *per sliding scale*    . lisinopril (PRINIVIL,ZESTRIL) 5 MG tablet Take 5 mg by mouth every morning.     . lubiprostone (AMITIZA) 8 MCG capsule Take 1 capsule (8 mcg total) by mouth 2 (two) times daily with a meal. 60 capsule 3  . metFORMIN (GLUCOPHAGE-XR) 500 MG 24 hr tablet Take 1,000 mg by mouth 2 (two) times daily.    . Multiple Vitamins-Minerals (COMPLETE MULTIVITAMIN/MINERAL PO) Take 1 tablet by mouth daily.     . ondansetron (ZOFRAN) 4 MG tablet Take 1 tablet (4 mg total) by mouth 3 (three) times  daily with meals. 90 tablet 3  . oxyCODONE-acetaminophen (PERCOCET/ROXICET) 5-325 MG per tablet Take 1 tablet by mouth every 6 (six) hours as needed for severe pain.    . pantoprazole (PROTONIX) 40 MG tablet Take 40 mg by mouth every morning.    . promethazine (PHENERGAN) 25 MG tablet Take 0.5 tablets (12.5 mg total) by mouth every 6 (six) hours as needed for nausea or vomiting. 30 tablet 3  . ranitidine (ZANTAC) 300 MG tablet Take 300 mg by mouth at bedtime as needed for heartburn. *takes if symptoms not relieved by protonix*    . rivaroxaban (XARELTO) 20 MG TABS tablet Take 20 mg by mouth every morning.    . sertraline (ZOLOFT) 100 MG tablet Take 100 mg by mouth daily.    .  simvastatin (ZOCOR) 80 MG tablet Take 80 mg by mouth every evening.    . topiramate (TOPAMAX) 25 MG tablet Take 25 mg by mouth every evening.     No current facility-administered medications for this visit.    Allergies as of 08/24/2014  . (No Known Allergies)    Family History  Problem Relation Age of Onset  . Colon cancer Neg Hx   . Liver disease Neg Hx     History   Social History  . Marital Status: Single    Spouse Name: N/A    Number of Children: N/A  . Years of Education: N/A   Social History Main Topics  . Smoking status: Current Every Day Smoker -- 1.50 packs/day  . Smokeless tobacco: None     Comment: 1/2 to one pack a day  . Alcohol Use: No  . Drug Use: No  . Sexual Activity: Yes   Other Topics Concern  . None   Social History Narrative    Review of Systems: As mentioned in HPI  Physical Exam: BP 136/87 mmHg  Pulse 107  Temp(Src) 97.4 F (36.3 C) (Oral)  Ht  (1.676 m)  Wt 202 lb 6.4 oz (91.808 kg)  BMI 32.68 kg/m2 General:   Alert and oriented. No distress noted. Pleasant and cooperative.  Head:  Normocephalic and atraumatic. Eyes:  Conjuctiva clear without scleral icterus. Mouth:  Oral mucosa pink and moist.  Heart:  S1, S2 present without murmurs Abdomen:  +BS, soft, TTP with light touch RUQ, +CARNETT'S SIGN and non-distended. No rebound or guarding. No HSM or masses noted. Msk:  Symmetrical without gross deformities. Normal posture. Extremities:  Without edema. Neurologic:  Alert and  oriented x4;  grossly normal neurologically. Psych:  Alert and cooperative. Normal mood and affect.

## 2014-08-27 ENCOUNTER — Telehealth: Payer: Self-pay | Admitting: Gastroenterology

## 2014-08-27 NOTE — Telephone Encounter (Signed)
ON FEB RECALL FOR REPEAT MRI

## 2014-08-28 ENCOUNTER — Telehealth: Payer: Self-pay

## 2014-08-28 ENCOUNTER — Telehealth: Payer: Self-pay | Admitting: Gastroenterology

## 2014-08-28 NOTE — Telephone Encounter (Signed)
Please let patient know she needs to hold Xarelto X 3 days prior to procedure with Dr. Darrick PennaFields. This has been approved by prescribing physician.  Nira RetortAnna W. Sams, ANP-BC [facility]

## 2014-08-28 NOTE — Telephone Encounter (Signed)
Pt is aware to hold Xarelto x 3 days prior to procedure with Dr. Darrick PennaFields.

## 2014-08-28 NOTE — Telephone Encounter (Signed)
Noted. Thanks.

## 2014-08-28 NOTE — Telephone Encounter (Signed)
Letter mailed with appointment for MRI on 09/07/14 @ 1245

## 2014-08-28 NOTE — Telephone Encounter (Signed)
Faxed letter to [facility] to Dr.Howard to see if it was ok to hold pts Xarelto for 3 days for her procedure. Valerie from [facility] called and said Dr.Howard said it was ok to hold Xarelto for 3 days prior to procedure.

## 2014-08-28 NOTE — Assessment & Plan Note (Addendum)
44 year old female with chronic RUQ pain since appendectomy several years ago, likely secondary to adhesive disease. As mentioned above, liver abscess treated and nearly resolved (see HPI). Additional symptoms of chronic intermittent nausea, vomiting, and solid food dysphagia warrants further upper GI evaluation. Continue Protonix once daily. On Xarelto due to past history of PE: per Dr. Dimas AguasHoward, may hold Xarelto X 3 days prior to procedure.  Proceed with upper endoscopy and dilation in the near future with Dr. Jena Gaussourk. The risks, benefits, and alternatives have been discussed in detail with patient. They have stated understanding and desire to proceed.  Hold Xarelto X 3 days prior Continue Protonix once daily

## 2014-08-29 ENCOUNTER — Other Ambulatory Visit: Payer: Self-pay

## 2014-08-29 NOTE — Assessment & Plan Note (Signed)
Resume Amitiza if indicated. Doing well now without any agents. No concerning lower GI symptoms.

## 2014-08-29 NOTE — Assessment & Plan Note (Signed)
Dilatation as appropriate.  

## 2014-08-29 NOTE — Telephone Encounter (Signed)
Called pt and told her to hold her [name] for 3 days prior to procedure. Pt aware to start holding on 09/07/2014

## 2014-08-30 DIAGNOSIS — E1143 Type 2 diabetes mellitus with diabetic autonomic (poly)neuropathy: Secondary | ICD-10-CM | POA: Diagnosis not present

## 2014-08-30 DIAGNOSIS — G8929 Other chronic pain: Secondary | ICD-10-CM | POA: Diagnosis not present

## 2014-08-30 DIAGNOSIS — E1140 Type 2 diabetes mellitus with diabetic neuropathy, unspecified: Secondary | ICD-10-CM | POA: Diagnosis not present

## 2014-08-30 DIAGNOSIS — F328 Other depressive episodes: Secondary | ICD-10-CM | POA: Diagnosis not present

## 2014-08-30 DIAGNOSIS — E1165 Type 2 diabetes mellitus with hyperglycemia: Secondary | ICD-10-CM | POA: Diagnosis not present

## 2014-08-30 DIAGNOSIS — F419 Anxiety disorder, unspecified: Secondary | ICD-10-CM | POA: Diagnosis not present

## 2014-08-30 NOTE — Telephone Encounter (Signed)
PER CJ: Called pt and told her to hold her Lesia HausenXaralto for 3 days prior to procedure. Pt aware to start holding on 09/07/2014

## 2014-09-06 ENCOUNTER — Telehealth: Payer: Self-pay

## 2014-09-06 ENCOUNTER — Other Ambulatory Visit: Payer: Self-pay

## 2014-09-06 ENCOUNTER — Encounter (HOSPITAL_COMMUNITY): Payer: Self-pay

## 2014-09-06 ENCOUNTER — Encounter (HOSPITAL_COMMUNITY)
Admission: RE | Admit: 2014-09-06 | Discharge: 2014-09-06 | Disposition: A | Discharge status: Home / Self Care | Payer: Medicare Other | Source: Ambulatory Visit | Attending: Gastroenterology | Admitting: Gastroenterology

## 2014-09-06 DIAGNOSIS — Z01818 Encounter for other preprocedural examination: Secondary | ICD-10-CM | POA: Insufficient documentation

## 2014-09-06 DIAGNOSIS — R1314 Dysphagia, pharyngoesophageal phase: Secondary | ICD-10-CM | POA: Diagnosis not present

## 2014-09-06 LAB — CBC
HEMATOCRIT: 36.8 % (ref 36.0–46.0)
HEMOGLOBIN: 12 g/dL (ref 12.0–15.0)
MCH: 31.4 pg (ref 26.0–34.0)
MCHC: 32.6 g/dL (ref 30.0–36.0)
MCV: 96.3 fL (ref 78.0–100.0)
Platelets: 166 10*3/uL (ref 150–400)
RBC: 3.82 MIL/uL — ABNORMAL LOW (ref 3.87–5.11)
RDW: 14.3 % (ref 11.5–15.5)
WBC: 7.4 10*3/uL (ref 4.0–10.5)

## 2014-09-06 LAB — BASIC METABOLIC PANEL
Anion gap: 7 (ref 5–15)
BUN: 9 mg/dL (ref 6–23)
CALCIUM: 8.6 mg/dL (ref 8.4–10.5)
CHLORIDE: 105 mmol/L (ref 96–112)
CO2: 22 mmol/L (ref 19–32)
Creatinine, Ser: 1.01 mg/dL (ref 0.50–1.10)
GFR calc Af Amer: 77 mL/min — ABNORMAL LOW (ref >90)
GFR, EST NON AFRICAN AMERICAN: 66 mL/min — AB (ref >90)
Glucose, Bld: 590 mg/dL (ref 70–99)
POTASSIUM: 4.2 mmol/L (ref 3.5–5.1)
Sodium: 134 mmol/L — ABNORMAL LOW (ref 135–145)

## 2014-09-06 NOTE — Patient Instructions (Signed)
[name]  09/06/2014   Your procedure is scheduled on:   09/11/2014   [facility] at  [facility]  AM.  Call this number if you have problems the morning of surgery: [phone]   Remember:   Do not eat food or drink liquids after midnight.   Take these medicines the morning of surgery with A SIP OF WATER:  Xanax, coreg, neurontin, lisinopril, zofran, oxycodone, protonix, phenergan, zoloft   Do not wear jewelry, make-up or nail polish.  Do not wear lotions, powders, or perfumes.   Do not shave 48 hours prior to surgery. Men may shave face and neck.  Do not bring valuables to the hospital.  [facility] is not responsible for any belongings or valuables.               Contacts, dentures or bridgework may not be worn into surgery.  Leave suitcase in the car. After surgery it may be brought to your room.  For patients admitted to the hospital, discharge time is determined by your treatment team.               Patients discharged the day of surgery will not be allowed to drive home.  Name and phone number of your driver: family  Special Instructions: N/A   Please read over the following fact sheets that you were given: Pain Booklet, Coughing and Deep Breathing, Surgical Site Infection Prevention, Anesthesia Post-op Instructions and Care and Recovery After Surgery Esophagogastroduodenoscopy Esophagogastroduodenoscopy (EGD) is a procedure to examine the lining of the esophagus, stomach, and first part of the small intestine (duodenum). A long, flexible, lighted tube with a camera attached (endoscope) is inserted down the throat to view these organs. This procedure is done to detect problems or abnormalities, such as inflammation, bleeding, ulcers, or growths, in order to treat them. The procedure lasts about 5-20 minutes. It is usually an outpatient procedure, but it may need to be performed in emergency cases in the hospital. LET YOUR CAREGIVER KNOW ABOUT:   Allergies to  food or medicine.  All medicines you are taking, including vitamins, herbs, eyedrops, and over-the-counter medicines and creams.  Use of steroids (by mouth or creams).  Previous problems you or members of your family have had with the use of anesthetics.  Any blood disorders you have.  Previous surgeries you have had.  Other health problems you have.  Possibility of pregnancy, if this applies. RISKS AND COMPLICATIONS  Generally, EGD is a safe procedure. However, as with any procedure, complications can occur. Possible complications include:  Infection.  Bleeding.  Tearing (perforation) of the esophagus, stomach, or duodenum.  Difficulty breathing or not being able to breath.  Excessive sweating.  Spasms of the larynx.  Slowed heartbeat.  Low blood pressure. BEFORE THE PROCEDURE  Do not eat or drink anything for 6-8 hours before the procedure or as directed by your caregiver.  Ask your caregiver about changing or stopping your regular medicines.  If you wear dentures, be prepared to remove them before the procedure.  Arrange for someone to drive you home after the procedure. PROCEDURE   A vein will be accessed to give medicines and fluids. A medicine to relax you (sedative) and a pain reliever will be given through that access into the vein.  A numbing medicine (local anesthetic) may be sprayed on your throat for comfort and to stop you from gagging or coughing.  A mouth guard may  be placed in your mouth to protect your teeth and to keep you from biting on the endoscope.  You will be asked to lie on your left side.  The endoscope is inserted down your throat and into the esophagus, stomach, and duodenum.  Air is put through the endoscope to allow your caregiver to view the lining of your esophagus clearly.  The esophagus, stomach, and duodenum is then examined. During the exam, your caregiver may:  Remove tissue to be examined under a microscope (biopsy) for  inflammation, infection, or other medical problems.  Remove growths.  Remove objects (foreign bodies) that are stuck.  Treat any bleeding with medicines or other devices that stop tissues from bleeding (hot cautery, clipping devices).  Widen (dilate) or stretch narrowed areas of the esophagus and stomach.  The endoscope will then be withdrawn. AFTER THE PROCEDURE  You will be taken to a recovery area to be monitored. You will be able to go home once you are stable and alert.  Do not eat or drink anything until the local anesthetic and numbing medicines have worn off. You may choke.  It is normal to feel bloated, have pain with swallowing, or have a sore throat for a short time. This will wear off.  Your caregiver should be able to discuss his or her findings with you. It will take longer to discuss the test results if any biopsies were taken. Document Released: 11/13/2004 Document Revised: 11/27/2013 Document Reviewed: 06/15/2012 [facility] Patient Information 2015 [place], [place]. This information is not intended to replace advice given to you by your health care provider. Make sure you discuss any questions you have with your health care provider. Esophageal Dilatation The esophagus is the long, narrow tube which carries food and liquid from the mouth to the stomach. Esophageal dilatation is the technique used to stretch a blocked or narrowed portion of the esophagus. This procedure is used when a part of the esophagus has become so narrow that it becomes difficult, painful or even impossible to swallow. This is generally an uncomplicated form of treatment. When this is not successful, chest surgery may be required. This is a much more extensive form of treatment with a longer recovery time. CAUSES  Some of the more common causes of blockage or strictures of the esophagus are:  Narrowing from longstanding inflammation (soreness and redness) of the lower esophagus. This comes from the  constant exposure of the lower esophagus to the acid which bubbles up from the stomach. Over time this causes scarring and narrowing of the lower esophagus.  Hiatal hernia in which a small part of the stomach bulges (herniates) up through the diaphragm. This can cause a gradual narrowing of the end of the esophagus.  Schatzki ring is a narrow ring of benign (non-cancerous) fibrous tissue which constricts the lower esophagus. The reason for this is not known.  Scleroderma is a connective tissue disorder that affects the esophagus and makes swallowing difficult.  Achalasia is an absence of nerves to the lower esophagus and to the esophageal sphincter. This is the circular muscle between the stomach and esophagus that relaxes to allow food into the stomach. After swallowing, it contracts to keep food in the stomach. This absence of nerves may be congenital (present since birth). This can cause irregular spasms of the lower esophageal muscle. This spasm does not open up to allow food and fluid through. The result is a persistent blockage with subsequent slow trickling of the esophageal contents into the stomach.  Strictures may develop from swallowing materials which damage the esophagus. Some examples are strong acids or alkalis such as lye.  Growths such as benign (non-cancerous) and malignant (cancerous) tumors can block the esophagus.  Hereditary (present since birth) causes. DIAGNOSIS  Your caregiver often suspects this problem by taking a medical history. They will also do a physical exam. They can then prove their suspicions using X-rays and endoscopy. Endoscopy is an exam in which a tube like a small, flexible telescope is used to look at your esophagus.  TREATMENT There are different stretching (dilating) techniques that can be used. Simple bougie dilatation may be done in the office. This usually takes only a couple minutes. A numbing (anesthetic) spray of the throat is used. Endoscopy, when  done, is done in an endoscopy suite under mild sedation. When fluoroscopy is used, the procedure is performed in X-ray. Other techniques require a little longer time. Recovery is usually quick. There is no waiting time to begin eating and drinking to test success of the treatment. Following are some of the methods used. Narrowing of the esophagus is treated by making it bigger. Commonly this is a mechanical problem which can be treated with stretching. This can be done in different ways. Your caregiver will discuss these with you. Some of the means used are:  A series of graduated (increasing thickness) flexible dilators can be used. These are weighted tubes passed through the esophagus into the stomach. The tubes used become progressively larger until the desired stretched size is reached. Graduated dilators are a simple and quick way of opening the esophagus. No visualization is required.  Another method is the use of endoscopy to place a flexible wire across the stricture. The endoscope is removed and the wire left in place. A dilator with a hole through it from end to end is guided down the esophagus and across the stricture. One or more of these dilators are passed over the wire. At the end of the exam, the wire is removed. This type of treatment may be performed in the X-ray department under fluoroscopy. An advantage of this procedure is the examiner is visualizing the end opening in the esophagus.  Stretching of the esophagus may be done using balloons. Deflated balloons are placed through the endoscope and across the stricture. This type of balloon dilatation is often done at the time of endoscopy or fluoroscopy. Flexible endoscopy allows the examiner to directly view the stricture. A balloon is inserted in the deflated form into the area of narrowing. It is then inflated with air to a certain pressure that is preset for a given circumference. When inflated, it becomes sausage shaped, stretched, and  makes the stricture larger.  Achalasia requires a longer, larger balloon-type dilator. This is frequently done under X-ray control. In this situation, the spastic muscle fibers in the lower esophagus are stretched. All of the above procedures make the passage of food and water into the stomach easier. They also make it easier for stomach contents to reflux back into the esophagus. Special medications may be used following the procedure to help prevent further stricturing. Proton-pump inhibitor medications are good at decreasing the amount of acid in the stomach juice. When stomach juice refluxes into the esophagus, the juice is no longer as acidic and is less likely to burn or scar the esophagus. RISKS AND COMPLICATIONS Esophageal dilatation is usually performed effectively and without problems. Some complications that can occur are:  A small amount of bleeding almost always happens  where the stretching takes place. If this is too excessive it may require more aggressive treatment.  An uncommon complication is perforation (making a hole) of the esophagus. The esophagus is thin. It is easy to make a hole in it. If this happens, an operation may be necessary to repair this.  A small, undetected perforation could lead to an infection in the chest. This can be very serious. HOME CARE INSTRUCTIONS   If you received sedation for your procedure, do not drive, make important decisions, or perform any activities requiring your full coordination. Do not drink alcohol, take sedatives, or use any mind altering chemicals unless instructed by your caregiver.  You may use throat lozenges or warm salt water gargles if you have throat discomfort.  You can begin eating and drinking normally on return home unless instructed otherwise. Do not purposely try to force large chunks of food down to test the benefits of your procedure.  Mild discomfort can be eased with sips of ice water.  Medications for discomfort may  or may not be needed. SEEK IMMEDIATE MEDICAL CARE IF:   You begin vomiting up blood.  You develop black, tarry stools.  You develop chills or an unexplained temperature of over 101F (38.3C)  You develop chest or abdominal pain.  You develop shortness of breath, or feel light-headed or faint.  Your swallowing is becoming more painful, difficult, or you are unable to swallow. MAKE SURE YOU:   Understand these instructions.  Will watch your condition.  Will get help right away if you are not doing well or get worse. Document Released: 09/03/2005 Document Revised: 11/27/2013 Document Reviewed: 10/21/2005 [facility] Patient Information 2015 [place], [place]. This information is not intended to replace advice given to you by your health care provider. Make sure you discuss any questions you have with your health care provider. PATIENT INSTRUCTIONS POST-ANESTHESIA  IMMEDIATELY FOLLOWING SURGERY:  Do not drive or operate machinery for the first twenty four hours after surgery.  Do not make any important decisions for twenty four hours after surgery or while taking narcotic pain medications or sedatives.  If you develop intractable nausea and vomiting or a severe headache please notify your doctor immediately.  FOLLOW-UP:  Please make an appointment with your surgeon as instructed. You do not need to follow up with anesthesia unless specifically instructed to do so.  WOUND CARE INSTRUCTIONS (if applicable):  Keep a dry clean dressing on the anesthesia/puncture wound site if there is drainage.  Once the wound has quit draining you may leave it open to air.  Generally you should leave the bandage intact for twenty four hours unless there is drainage.  If the epidural site drains for more than 36-48 hours please call the anesthesia department.  QUESTIONS?:  Please feel free to call your physician or the hospital operator if you have any questions, and they will be happy to assist you.

## 2014-09-06 NOTE — Telephone Encounter (Signed)
Pt came by office and states that she just left her pre-op appt. And her times were changed and she was unaware. Pt states she needs original time that was set for procedure (945) and an arrival at 815-830am due to caring for her mother.  Wants to know if Dr. Darrick PennaFields can make an exception for this.  Spoke with Dr. Darrick PennaFields and she states she will do her 1st OR case and then a regular sedation and then the Mrs. Darcel BayleyLeonard.

## 2014-09-06 NOTE — Pre-Procedure Instructions (Signed)
Glucose of 590 called to Wynne DustEric Gill, PA. He is to let Dr Darrick PennaFields know.

## 2014-09-06 NOTE — Pre-Procedure Instructions (Signed)
Patient given information to sign up for my chart at home. 

## 2014-09-07 ENCOUNTER — Ambulatory Visit (HOSPITAL_COMMUNITY): Admission: RE | Admit: 2014-09-07 | Payer: Medicare Other | Source: Ambulatory Visit

## 2014-09-07 ENCOUNTER — Telehealth: Payer: Self-pay | Admitting: Gastroenterology

## 2014-09-07 NOTE — Progress Notes (Signed)
REVIEWED.  

## 2014-09-07 NOTE — Pre-Procedure Instructions (Signed)
Dr Jayme CloudGonzalez made aware of glucose of 590. Will do fasting CBG am of procedure.

## 2014-09-07 NOTE — Telephone Encounter (Signed)
REVIEWED. AGREE. NO ADDITIONAL RECOMMENDATIONS. 

## 2014-09-07 NOTE — Progress Notes (Signed)
REVIEWED-NO ADDITIONAL RECOMMENDATIONS. 

## 2014-09-07 NOTE — Telephone Encounter (Signed)
PLEASE CALL PT. HER GLUCOSE IS 590. HER BLOOD COUNT IS NORMAL. SHE SHOULD FOLLOW A DIABETIC DIET AND USE HER HUMALOG SLIDING SCALE TO GET GER BLOOD SUGAR DOWN. SHE NEEDS TO DRINK WATER TO KEEP HER URINE LIGHT YELLOW.

## 2014-09-07 NOTE — Telephone Encounter (Signed)
Patient called and left her a message from Dr Darrick PennaFields. I let her know per Dr Darrick PennaFields that her glucose was 590 and her blood count was normal. That she needs to follow a diabetic diet and use her Humalog sliding scale to get her bloodsugar under control. Instructed her that she needs to drink plenty of water to keep her urine a light yellow.

## 2014-09-11 ENCOUNTER — Encounter (HOSPITAL_COMMUNITY): Admission: RE | Payer: Self-pay | Source: Ambulatory Visit

## 2014-09-11 ENCOUNTER — Other Ambulatory Visit: Payer: Self-pay

## 2014-09-11 ENCOUNTER — Telehealth: Payer: Self-pay

## 2014-09-11 ENCOUNTER — Ambulatory Visit (HOSPITAL_COMMUNITY): Admission: RE | Admit: 2014-09-11 | Payer: Medicare Other | Source: Ambulatory Visit | Admitting: Gastroenterology

## 2014-09-11 SURGERY — ESOPHAGOGASTRODUODENOSCOPY (EGD) WITH PROPOFOL
Anesthesia: Monitor Anesthesia Care

## 2014-09-11 NOTE — Telephone Encounter (Signed)
REVIEWED-NO ADDITIONAL RECOMMENDATIONS. 

## 2014-09-11 NOTE — Progress Notes (Signed)
cc'ed to pcp °

## 2014-09-11 NOTE — Telephone Encounter (Signed)
Pt called to reschedule her appointment for her procedure. She is now set up for 10/09/2014 @ 930am . Pt is aware of instructions and will follow previous instructions and follow them for the new date.   Medications reviewed and no changes noted.

## 2014-10-04 ENCOUNTER — Encounter (HOSPITAL_COMMUNITY): Payer: Self-pay

## 2014-10-04 ENCOUNTER — Encounter (HOSPITAL_COMMUNITY)
Admission: RE | Admit: 2014-10-04 | Discharge: 2014-10-04 | Disposition: A | Discharge status: Home / Self Care | Payer: Medicare Other | Source: Ambulatory Visit | Attending: Gastroenterology | Admitting: Gastroenterology

## 2014-10-04 NOTE — Pre-Procedure Instructions (Signed)
Pre op assessment completed.  Patient verbalizes understanding and is aware of medications to take am of surgery.  Advised to take only 1/2 of her regular dose of insulin the am of procedure and nothing for diabetes that morning.

## 2014-10-09 ENCOUNTER — Ambulatory Visit (HOSPITAL_COMMUNITY): Payer: Medicare Other | Admitting: Anesthesiology

## 2014-10-09 ENCOUNTER — Encounter (HOSPITAL_COMMUNITY): Payer: Self-pay | Admitting: *Deleted

## 2014-10-09 ENCOUNTER — Encounter (HOSPITAL_COMMUNITY)
Admission: RE | Disposition: A | Discharge status: Home / Self Care | Payer: Self-pay | Source: Ambulatory Visit | Attending: Gastroenterology

## 2014-10-09 ENCOUNTER — Ambulatory Visit (HOSPITAL_COMMUNITY)
Admission: RE | Admit: 2014-10-09 | Discharge: 2014-10-09 | Disposition: A | Discharge status: Home / Self Care | Payer: Medicare Other | Source: Ambulatory Visit | Attending: Gastroenterology | Admitting: Gastroenterology

## 2014-10-09 DIAGNOSIS — J449 Chronic obstructive pulmonary disease, unspecified: Secondary | ICD-10-CM | POA: Diagnosis not present

## 2014-10-09 DIAGNOSIS — Z9049 Acquired absence of other specified parts of digestive tract: Secondary | ICD-10-CM | POA: Insufficient documentation

## 2014-10-09 DIAGNOSIS — K2970 Gastritis, unspecified, without bleeding: Secondary | ICD-10-CM | POA: Diagnosis not present

## 2014-10-09 DIAGNOSIS — Z79899 Other long term (current) drug therapy: Secondary | ICD-10-CM | POA: Insufficient documentation

## 2014-10-09 DIAGNOSIS — K219 Gastro-esophageal reflux disease without esophagitis: Secondary | ICD-10-CM | POA: Diagnosis not present

## 2014-10-09 DIAGNOSIS — R112 Nausea with vomiting, unspecified: Secondary | ICD-10-CM

## 2014-10-09 DIAGNOSIS — F419 Anxiety disorder, unspecified: Secondary | ICD-10-CM | POA: Diagnosis not present

## 2014-10-09 DIAGNOSIS — Z7901 Long term (current) use of anticoagulants: Secondary | ICD-10-CM | POA: Insufficient documentation

## 2014-10-09 DIAGNOSIS — Z79891 Long term (current) use of opiate analgesic: Secondary | ICD-10-CM | POA: Diagnosis not present

## 2014-10-09 DIAGNOSIS — R109 Unspecified abdominal pain: Secondary | ICD-10-CM

## 2014-10-09 DIAGNOSIS — R1310 Dysphagia, unspecified: Secondary | ICD-10-CM | POA: Diagnosis not present

## 2014-10-09 DIAGNOSIS — J45909 Unspecified asthma, uncomplicated: Secondary | ICD-10-CM | POA: Insufficient documentation

## 2014-10-09 DIAGNOSIS — E1165 Type 2 diabetes mellitus with hyperglycemia: Secondary | ICD-10-CM | POA: Insufficient documentation

## 2014-10-09 DIAGNOSIS — I739 Peripheral vascular disease, unspecified: Secondary | ICD-10-CM | POA: Diagnosis not present

## 2014-10-09 DIAGNOSIS — R1011 Right upper quadrant pain: Secondary | ICD-10-CM | POA: Diagnosis not present

## 2014-10-09 DIAGNOSIS — F17210 Nicotine dependence, cigarettes, uncomplicated: Secondary | ICD-10-CM | POA: Diagnosis not present

## 2014-10-09 DIAGNOSIS — Z794 Long term (current) use of insulin: Secondary | ICD-10-CM | POA: Insufficient documentation

## 2014-10-09 DIAGNOSIS — Z6833 Body mass index (BMI) 33.0-33.9, adult: Secondary | ICD-10-CM | POA: Insufficient documentation

## 2014-10-09 DIAGNOSIS — K2950 Unspecified chronic gastritis without bleeding: Secondary | ICD-10-CM | POA: Insufficient documentation

## 2014-10-09 DIAGNOSIS — Z86711 Personal history of pulmonary embolism: Secondary | ICD-10-CM | POA: Insufficient documentation

## 2014-10-09 DIAGNOSIS — I10 Essential (primary) hypertension: Secondary | ICD-10-CM | POA: Insufficient documentation

## 2014-10-09 DIAGNOSIS — M1990 Unspecified osteoarthritis, unspecified site: Secondary | ICD-10-CM | POA: Insufficient documentation

## 2014-10-09 DIAGNOSIS — F329 Major depressive disorder, single episode, unspecified: Secondary | ICD-10-CM | POA: Diagnosis not present

## 2014-10-09 DIAGNOSIS — Z7982 Long term (current) use of aspirin: Secondary | ICD-10-CM | POA: Diagnosis not present

## 2014-10-09 HISTORY — PX: ESOPHAGOGASTRODUODENOSCOPY (EGD) WITH PROPOFOL: SHX5813

## 2014-10-09 HISTORY — PX: ESOPHAGEAL DILATION: SHX303

## 2014-10-09 LAB — GLUCOSE, CAPILLARY
Glucose-Capillary: 340 mg/dL — ABNORMAL HIGH (ref 70–99)
Glucose-Capillary: 337 mg/dL — ABNORMAL HIGH (ref 70–99)
Glucose-Capillary: 264 mg/dL — ABNORMAL HIGH (ref 70–99)

## 2014-10-09 SURGERY — ESOPHAGOGASTRODUODENOSCOPY (EGD) WITH PROPOFOL
Anesthesia: Monitor Anesthesia Care

## 2014-10-09 MED ORDER — SIMETHICONE 40 MG/0.6ML PO SUSP
ORAL | Status: DC | PRN
  Administered 2014-10-09: 10:00:00

## 2014-10-09 MED ORDER — ONDANSETRON HCL 4 MG/2ML IJ SOLN
INTRAMUSCULAR | Status: AC
  Filled 2014-10-09: qty 2

## 2014-10-09 MED ORDER — MIDAZOLAM HCL 2 MG/2ML IJ SOLN
1.0000 mg | INTRAMUSCULAR | Status: DC | PRN
  Administered 2014-10-09 (×2): 2 mg via INTRAVENOUS
  Filled 2014-10-09: qty 2

## 2014-10-09 MED ORDER — LIDOCAINE VISCOUS 2 % MT SOLN
15.0000 mL | Freq: Once | OROMUCOSAL | Status: AC
  Administered 2014-10-09: 15 mL via OROMUCOSAL

## 2014-10-09 MED ORDER — MINERAL OIL PO OIL
TOPICAL_OIL | ORAL | Status: AC
  Filled 2014-10-09: qty 30

## 2014-10-09 MED ORDER — FENTANYL CITRATE 0.05 MG/ML IJ SOLN
INTRAMUSCULAR | Status: AC
  Filled 2014-10-09: qty 2

## 2014-10-09 MED ORDER — LACTATED RINGERS IV SOLN
INTRAVENOUS | Status: DC
  Administered 2014-10-09: 1000 mL via INTRAVENOUS

## 2014-10-09 MED ORDER — PANTOPRAZOLE SODIUM 40 MG PO TBEC: 40.0000 mg | DELAYED_RELEASE_TABLET | Freq: Two times a day (BID) | ORAL | Status: DC

## 2014-10-09 MED ORDER — STERILE WATER FOR IRRIGATION IR SOLN
Status: DC | PRN
  Administered 2014-10-09: 1000 mL

## 2014-10-09 MED ORDER — LACTATED RINGERS IV SOLN
INTRAVENOUS | Status: DC | PRN
  Administered 2014-10-09: 08:00:00 via INTRAVENOUS

## 2014-10-09 MED ORDER — LIDOCAINE HCL (CARDIAC) 10 MG/ML IV SOLN
INTRAVENOUS | Status: DC | PRN
  Administered 2014-10-09: 50 mg via INTRAVENOUS

## 2014-10-09 MED ORDER — ONDANSETRON HCL 4 MG/2ML IJ SOLN
4.0000 mg | Freq: Once | INTRAMUSCULAR | Status: AC
  Administered 2014-10-09: 4 mg via INTRAVENOUS

## 2014-10-09 MED ORDER — MIDAZOLAM HCL 2 MG/2ML IJ SOLN
INTRAMUSCULAR | Status: AC
  Filled 2014-10-09: qty 2

## 2014-10-09 MED ORDER — INSULIN ASPART 100 UNIT/ML ~~LOC~~ SOLN
5.0000 [IU] | Freq: Once | SUBCUTANEOUS | Status: AC
  Administered 2014-10-09: 5 [IU] via SUBCUTANEOUS
  Filled 2014-10-09: qty 0.05

## 2014-10-09 MED ORDER — FENTANYL CITRATE 0.05 MG/ML IJ SOLN
25.0000 ug | INTRAMUSCULAR | Status: AC
  Administered 2014-10-09 (×2): 25 ug via INTRAVENOUS

## 2014-10-09 MED ORDER — LIDOCAINE VISCOUS 2 % MT SOLN
OROMUCOSAL | Status: AC
  Filled 2014-10-09: qty 15

## 2014-10-09 MED ORDER — PROPOFOL INFUSION 10 MG/ML OPTIME
INTRAVENOUS | Status: DC | PRN
  Administered 2014-10-09: 125 ug/kg/min via INTRAVENOUS

## 2014-10-09 MED ORDER — GLYCOPYRROLATE 0.2 MG/ML IJ SOLN
INTRAMUSCULAR | Status: AC
  Filled 2014-10-09: qty 1

## 2014-10-09 SURGICAL SUPPLY — 29 items
BALLN CRE LF 10-12 240X5.5 (BALLOONS)
BALLN CRE LF 10-12MM 240X5.5 (BALLOONS)
BALLN DILATOR CRE 12-15 240 (BALLOONS)
BALLN DILATOR CRE 15-18 240 (BALLOONS) IMPLANT
BALLN DILATOR CRE 18-20 240 (BALLOONS) IMPLANT
BALLN DILATOR CRE WIREGUIDE (BALLOONS)
BALLOON CRE LF 10-12 240X5.5 (BALLOONS) IMPLANT
BALLOON DILATOR CRE 12-15 240 (BALLOONS) IMPLANT
BALLOON DILATOR CRE WIREGUIDE (BALLOONS) IMPLANT
BLOCK BITE 60FR ADLT L/F BLUE (MISCELLANEOUS) ×2 IMPLANT
ELECT REM PT RETURN 9FT ADLT (ELECTROSURGICAL)
ELECTRODE REM PT RTRN 9FT ADLT (ELECTROSURGICAL) IMPLANT
FLOOR PAD 36X40 (MISCELLANEOUS) ×3
FORCEPS BIOP RAD 4 LRG CAP 4 (CUTTING FORCEPS) ×2 IMPLANT
FORMALIN 10 PREFIL 20ML (MISCELLANEOUS) ×2 IMPLANT
KIT CLEAN ENDO COMPLIANCE (KITS) ×2 IMPLANT
MANIFOLD NEPTUNE II (INSTRUMENTS) ×3 IMPLANT
NDL SCLEROTHERAPY 25GX240 (NEEDLE) IMPLANT
NEEDLE SCLEROTHERAPY 25GX240 (NEEDLE) IMPLANT
PAD FLOOR 36X40 (MISCELLANEOUS) IMPLANT
PROBE APC STR FIRE (PROBE) IMPLANT
PROBE INJECTION GOLD (MISCELLANEOUS)
PROBE INJECTION GOLD 7FR (MISCELLANEOUS) IMPLANT
SNARE SHORT THROW 13M SML OVAL (MISCELLANEOUS) IMPLANT
SYR 50ML LL SCALE MARK (SYRINGE) ×2 IMPLANT
SYR INFLATE BILIARY GAUGE (MISCELLANEOUS) IMPLANT
SYR INFLATION 60ML (SYRINGE) IMPLANT
TUBING IRRIGATION ENDOGATOR (MISCELLANEOUS) ×2 IMPLANT
WATER STERILE IRR 1000ML POUR (IV SOLUTION) ×2 IMPLANT

## 2014-10-09 NOTE — Progress Notes (Signed)
REVIEWED-NO ADDITIONAL RECOMMENDATIONS. 

## 2014-10-09 NOTE — Anesthesia Preprocedure Evaluation (Addendum)
Anesthesia Evaluation  Patient identified by MRN, date of birth, ID band Patient awake    Reviewed: Allergy & Precautions, Patient's Chart, lab work & pertinent test results  Airway Mallampati: II  TM Distance: >3 FB     Dental  (+) Teeth Intact   Pulmonary shortness of breath, asthma , COPDCurrent Smoker,  Hx resp failure with hypoxia  breath sounds clear to auscultation        Cardiovascular hypertension, Pt. on medications + angina with exertion + Peripheral Vascular Disease Rhythm:Regular Rate:Normal     Neuro/Psych  Headaches, PSYCHIATRIC DISORDERS Anxiety Depression    GI/Hepatic GERD-  Poorly Controlled,  Endo/Other  diabetes, Poorly Controlled, Type 2, Insulin DependentMorbid obesity  Renal/GU      Musculoskeletal   Abdominal   Peds  Hematology  (+) anemia ,   Anesthesia Other Findings   Reproductive/Obstetrics                            Anesthesia Physical Anesthesia Plan  ASA: III  Anesthesia Plan: MAC   Post-op Pain Management:    Induction: Intravenous  Airway Management Planned: Simple Face Mask  Additional Equipment:   Intra-op Plan:   Post-operative Plan:   Informed Consent: I have reviewed the patients History and Physical, chart, labs and discussed the procedure including the risks, benefits and alternatives for the proposed anesthesia with the patient or authorized representative who has indicated his/her understanding and acceptance.     Plan Discussed with:   Anesthesia Plan Comments:         Anesthesia Quick Evaluation

## 2014-10-09 NOTE — Anesthesia Postprocedure Evaluation (Signed)
  Anesthesia Post-op Note  Patient: [name]  Procedure(s) Performed: Procedure(s) with comments: ESOPHAGOGASTRODUODENOSCOPY (EGD) WITH PROPOFOL (N/A) ESOPHAGEAL DILATION (N/A) - #15 and #16 savory  Patient Location: PACU  Anesthesia Type:MAC  Level of Consciousness: awake, alert , oriented and patient cooperative  Airway and Oxygen Therapy: Patient Spontanous Breathing and Patient connected to nasal cannula oxygen  Post-op Pain: none  Post-op Assessment: Post-op Vital signs reviewed, Patient's Cardiovascular Status Stable, Respiratory Function Stable, Patent Airway, No signs of Nausea or vomiting and Pain level controlled  Post-op Vital Signs: Reviewed and stable  Last Vitals:  Filed Vitals:   10/09/14 0935  BP: 80/54  Pulse:   Temp:   Resp: 20    Complications: No apparent anesthesia complications

## 2014-10-09 NOTE — Transfer of Care (Signed)
Immediate Anesthesia Transfer of Care Note  Patient: [name]  Procedure(s) Performed: Procedure(s) with comments: ESOPHAGOGASTRODUODENOSCOPY (EGD) WITH PROPOFOL (N/A) ESOPHAGEAL DILATION (N/A) - #15 and #16 savory  Patient Location: PACU  Anesthesia Type:MAC  Level of Consciousness: awake and alert   Airway & Oxygen Therapy: Patient Spontanous Breathing and Patient connected to nasal cannula oxygen  Post-op Assessment: Report given to RN and Post -op Vital signs reviewed and stable  Post vital signs: Reviewed and stable  Last Vitals:  Filed Vitals:   10/09/14 0935  BP: 80/54  Pulse:   Temp:   Resp: 20    Complications: No apparent anesthesia complications

## 2014-10-09 NOTE — Discharge Instructions (Signed)
YOUR NAUSEA AND VOMITING ARE MOST LIKELY DUE TO UNCONTROLLED BLOOD SUGARS, REFLUX, AND GASTRITIS. I dilated your esophagus. I DID NOT SEE A DEFINITE NARROWING IN YOUR esophagus. You have MILD gastritis. I biopsied your stomach.   STRICTLY FOLLOW YOU DIABETIC DIET AND TAKE YOUR DIABETES MEDS/INSULIN. IF NOT YOUR ARE GOING TO CONTINUE TO HAVE PROBLEMS WITH NAUSEA AND VOMITING AND ABDOMINAL PAIN.  INCREASE PROTONIX. TAKE 30 MINUTES PRIOR TO MEALS TWICE DAILY.  ZANTAC HELPS MOST WHEN USED AS NEEDED.  YOUR BIOPSY RESULTS WILL BE AVAILABLE IN MY CHART AFTER MAR 17 OR MY OFFICE WILL CONTACT YOU IN 10-14 DAYS WITH YOUR RESULTS.   FOLLOW UP IN 4 MOS. UPPER ENDOSCOPY AFTER CARE Read the instructions outlined below and refer to this sheet in the next week. These discharge instructions provide you with general information on caring for yourself after you leave the hospital. While your treatment has been planned according to the most current medical practices available, unavoidable complications occasionally occur. If you have any problems or questions after discharge, call DR. Gregroy Dombkowski, [phone].  ACTIVITY  You may resume your regular activity, but move at a slower pace for the next 24 hours.   Take frequent rest periods for the next 24 hours.   Walking will help get rid of the air and reduce the bloated feeling in your belly (abdomen).   No driving for 24 hours (because of the medicine (anesthesia) used during the test).   You may shower.   Do not sign any important legal documents or operate any machinery for 24 hours (because of the anesthesia used during the test).    NUTRITION  Drink plenty of fluids.   You may resume your normal diet as instructed by your doctor.   Begin with a light meal and progress to your normal diet. Heavy or fried foods are harder to digest and may make you feel sick to your stomach (nauseated).   Avoid alcoholic beverages for 24 hours or as instructed.     MEDICATIONS  You may resume your normal medications.   WHAT YOU CAN EXPECT TODAY  Some feelings of bloating in the abdomen.   Passage of more gas than usual.    IF YOU HAD A BIOPSY TAKEN DURING THE UPPER ENDOSCOPY:  Eat a soft diet IF YOU HAVE NAUSEA, BLOATING, ABDOMINAL PAIN, OR VOMITING.    FINDING OUT THE RESULTS OF YOUR TEST Not all test results are available during your visit. DR. Darrick PennaFIELDS WILL CALL YOU WITHIN 14 DAYS OF YOUR PROCEDUE WITH YOUR RESULTS. Do not assume everything is normal if you have not heard from DR. Mesha Schamberger, CALL HER OFFICE AT [phone].  SEEK IMMEDIATE MEDICAL ATTENTION AND CALL THE OFFICE: [phone] IF:  You have more than a spotting of blood in your stool.   Your belly is swollen (abdominal distention).   You are nauseated or vomiting.   You have a temperature over 101F.   You have abdominal pain or discomfort that is severe or gets worse throughout the day.  Gastritis  Gastritis is an inflammation (the body's way of reacting to injury and/or infection) of the stomach. It is often caused by viral or bacterial (germ) infections. It can also be caused BY ASPIRIN, BC/GOODY POWDER'S, (IBUPROFEN) MOTRIN, OR ALEVE (NAPROXEN), chemicals (including alcohol), SPICY FOODS, and medications. This illness may be associated with generalized malaise (feeling tired, not well), UPPER ABDOMINAL STOMACH cramps, and fever. One common bacterial cause of gastritis is an organism known as H. Pylori.  This can be treated with antibiotics.   DYSPHAGIA DYSPHAGIA can be caused by stomach acid backing up into the tube that carries food from the mouth down to the stomach (lower esophagus).  TREATMENT There are a number of medicines used to treat DYSPHAGIA including:  Antacids.  Proton-pump inhibitors: PROTONIX ZANTAC  HOME CARE INSTRUCTIONS Eat 2-3 hours before going to bed.  Try to reach and maintain a healthy weight.  Do not eat just a few very large meals.  Instead, eat 4 TO 6 smaller meals throughout the day.  Try to identify foods and beverages that make your symptoms worse, and avoid these.  Avoid tight clothing.  Do not exercise right after eating.

## 2014-10-09 NOTE — H&P (Signed)
Primary Care Physician:  Selinda FlavinHOWARD, KEVIN, MD Primary Gastroenterologist:  Dr. Darrick PennaFields  Pre-Procedure History & Physical: HPI:  [name] is a 44 y.o. female here for DYSPHAGIA/abd pain.  Past Medical History  Diagnosis Date  . Peripheral vascular disease   . Hypertension   . Anginal pain   . Anxiety   . Depression   . Asthma   . COPD (chronic obstructive pulmonary disease)   . Shortness of breath   . Tuberculosis   . GERD (gastroesophageal reflux disease)   . Headache(784.0)   . Arthritis   . Diabetes mellitus 01/28/2012  . Anemia 02/05/2012  . PE (pulmonary embolism)     Xarelto    Past Surgical History  Procedure Laterality Date  . Appendectomy    . Cholecystectomy    . Diagnostic laparoscopy      Prior to Admission medications   Medication Sig Start Date End Date Taking? Authorizing Provider  ALPRAZolam Prudy Feeler(XANAX) 0.5 MG tablet Take 0.5 mg by mouth 2 (two) times daily as needed for anxiety.   Yes Historical Provider, MD  Calcium Carbonate-Vitamin D (CALCIUM 600 + D PO) Take 1 tablet by mouth daily.    Yes Historical Provider, MD  carvedilol (COREG) 3.125 MG tablet Take 3.125 mg by mouth 2 (two) times daily with a meal.   Yes Historical Provider, MD  fenofibrate (TRICOR) 145 MG tablet Take 145 mg by mouth daily.   Yes Historical Provider, MD  gabapentin (NEURONTIN) 600 MG tablet Take 600 mg by mouth 3 (three) times daily.   Yes Historical Provider, MD  glipiZIDE (GLUCOTROL) 10 MG tablet Take 10 mg by mouth 2 (two) times daily.   Yes Historical Provider, MD  insulin glargine (LANTUS) 100 UNIT/ML injection Inject 20 Units into the skin every morning.   Yes Historical Provider, MD  insulin lispro (HUMALOG) 100 UNIT/ML injection Inject 2-6 Units into the skin 3 (three) times daily before meals. *per sliding scale*   Yes Historical Provider, MD  lisinopril (PRINIVIL,ZESTRIL) 5 MG tablet Take 5 mg by mouth every morning.    Yes Historical Provider, MD  lubiprostone (AMITIZA) 8  MCG capsule Take 1 capsule (8 mcg total) by mouth 2 (two) times daily with a meal. 04/18/14  Yes Nira RetortAnna W Sams, NP  metFORMIN (GLUCOPHAGE-XR) 500 MG 24 hr tablet Take 1,000 mg by mouth 2 (two) times daily.   Yes Historical Provider, MD  Multiple Vitamins-Minerals (COMPLETE MULTIVITAMIN/MINERAL PO) Take 1 tablet by mouth daily.    Yes Historical Provider, MD  ondansetron (ZOFRAN) 4 MG tablet Take 1 tablet (4 mg total) by mouth 3 (three) times daily with meals. 02/21/14  Yes Nira RetortAnna W Sams, NP  oxyCODONE-acetaminophen (PERCOCET/ROXICET) 5-325 MG per tablet Take 1 tablet by mouth every 6 (six) hours as needed for severe pain.   Yes Historical Provider, MD  pantoprazole (PROTONIX) 40 MG tablet Take 40 mg by mouth every morning.   Yes Historical Provider, MD  promethazine (PHENERGAN) 25 MG tablet Take 0.5 tablets (12.5 mg total) by mouth every 6 (six) hours as needed for nausea or vomiting. 02/21/14  Yes Nira RetortAnna W Sams, NP  ranitidine (ZANTAC) 300 MG tablet Take 300 mg by mouth at bedtime as needed for heartburn. *takes if symptoms not relieved by protonix*   Yes Historical Provider, MD  sertraline (ZOLOFT) 100 MG tablet Take 100 mg by mouth daily.   Yes Historical Provider, MD  simvastatin (ZOCOR) 80 MG tablet Take 80 mg by mouth every evening.   Yes  Historical Provider, MD  topiramate (TOPAMAX) 25 MG tablet Take 25 mg by mouth every evening.   Yes Historical Provider, MD  aspirin 325 MG tablet Take 325 mg by mouth daily.    Historical Provider, MD  rivaroxaban (XARELTO) 20 MG TABS tablet Take 20 mg by mouth every morning.    Historical Provider, MD    Allergies as of 09/11/2014  . (No Known Allergies)    Family History  Problem Relation Age of Onset  . Colon cancer Neg Hx   . Liver disease Neg Hx     History   Social History  . Marital Status: Single    Spouse Name: N/A  . Number of Children: N/A  . Years of Education: N/A   Occupational History  . Not on file.   Social History Main Topics  .  Smoking status: Current Every Day Smoker -- 1.00 packs/day for 20 years    Types: Cigarettes  . Smokeless tobacco: Not on file     Comment: 1/2 to one pack a day  . Alcohol Use: No  . Drug Use: No  . Sexual Activity: Yes    Birth Control/ Protection: Injection   Other Topics Concern  . Not on file   Social History Narrative    Review of Systems: See HPI, otherwise negative ROS   Physical Exam: BP 94/58 mmHg  Pulse 99  Temp(Src) 98.5 F (36.9 C) (Oral)  Resp 15  Ht  (1.676 m)  Wt 205 lb (92.987 kg)  BMI 33.10 kg/m2  SpO2 99% General:   Alert,  pleasant and cooperative in NAD Head:  Normocephalic and atraumatic. Neck:  Supple; Lungs:  Clear throughout to auscultation.    Heart:  Regular rate and rhythm. Abdomen:  Soft, nontender and nondistended. Normal bowel sounds, without guarding, and without rebound.   Neurologic:  Alert and  oriented x4;  grossly normal neurologically.  Impression/Plan:      DYSPHAGIA/abd pain PLAN:  EGD/?DIL TODAY

## 2014-10-09 NOTE — Anesthesia Procedure Notes (Signed)
Procedure Name: MAC Date/Time: 10/09/2014 9:44 AM Performed by: Pernell DupreADAMS, Kadiatou Oplinger A Pre-anesthesia Checklist: Patient identified, Timeout performed, Emergency Drugs available, Suction available and Patient being monitored Oxygen Delivery Method: Simple face mask

## 2014-10-10 NOTE — Op Note (Signed)
[facility] [address] [place], [id number]   ENDOSCOPY PROCEDURE REPORT  PATIENT: [name], [name]  MR#: [id number] BIRTHDATE: August 23, 1970 , 43  yrs. old GENDER: female  ENDOSCOPIST: West BaliSandi L Yvonne Stopher, MD REFFERED XB:JYNWGBY:Kevin Howard, M.[name]  PROCEDURE DATE:  10/09/2014 PROCEDURE:   EGD with biopsy and EGD with dilatation over guidewire   INDICATIONS:1.  nausea and vomiting.   2.  abdominal pain. PT intermittently takes INSULIN SHOTS. MEDICATIONS: Monitored anesthesia care TOPICAL ANESTHETIC: Viscous Xylocaine  DESCRIPTION OF PROCEDURE:   After the risks benefits and alternatives of the procedure were thoroughly explained, informed consent was obtained.  The     endoscope was introduced through the mouth and advanced to the second portion of the duodenum. The instrument was slowly withdrawn as the mucosa was carefully examined.  Prior to withdrawal of the scope, the guidwire was placed.  The esophagus was dilated successfully.  The patient was recovered in endoscopy and discharged home in satisfactory condition.   ESOPHAGUS: SCOPE PASSED EASILY INTO PROXIMAL ESOPHAGUS.  NL ESOPHAGEAL MUCOSA.   STOMACH: Mild erosive gastritis (inflammation) was found in the gastric antrum.  Multiple biopsies were performed using cold forceps.   DUODENUM: The duodenal mucosa showed no abnormalities in the bulb and second portion of the duodenum. Dilation was then performed at the gastroesphageal junction Dilator: Savary over guidewire Size(s): 15-16 MM Resistance: minimal Heme: none  COMPLICATIONS: There were no immediate complications.  ENDOSCOPIC IMPRESSION: 1.  NAUSEA AND VOMITING MOST LIKELY DUE TO UNCONTROLLED BLOOD SUGARS, REFLUX, AND GASTRITIS. 2.  EMPIRIC DILATION DUE TO C/O DYSPHAGIA  RECOMMENDATIONS: STRICTLY FOLLOW A DIABETIC DIET AND TAKE DIABETES MEDS/INSULIN. INCREASE PROTONIX.  TAKE 30 MINUTES PRIOR TO MEALS TWICE DAILY. ZANTAC HELPS MOST WHEN USED AS  NEEDED. AWAIT BIOPSY. FOLLOW UP IN 4 MOS.   eSigned:  West BaliSandi L Clem Wisenbaker, MD 10/10/2014 3:56 PM   CPT CODES: ICD CODES:  The ICD and CPT codes recommended by this software are interpretations from the data that the clinical staff has captured with the software.  The verification of the translation of this report to the ICD and CPT codes and modifiers is the sole responsibility of the health care institution and practicing physician where this report was generated.  [employer] will not be held responsible for the validity of the ICD and CPT codes included on this report.  AMA assumes no liability for data contained or not contained herein. CPT is a Publishing rights managerregistered trademark of the [employer].

## 2014-10-11 ENCOUNTER — Encounter (HOSPITAL_COMMUNITY): Payer: Self-pay | Admitting: Gastroenterology

## 2014-10-22 ENCOUNTER — Telehealth: Payer: Self-pay | Admitting: Gastroenterology

## 2014-10-22 NOTE — Telephone Encounter (Signed)
LMOM for pt that I have not gotten any results from Dr. Darrick PennaFields yet. I will send message to Dr. Darrick PennaFields and let her know soon.

## 2014-10-22 NOTE — Telephone Encounter (Signed)
PATIENT CALLED INQUIRING ON RESULTS FROM PROCEDURE SHE HAD 10/09/14.  PLEASE ADVISE

## 2014-10-23 NOTE — Telephone Encounter (Signed)
LMOM to call.

## 2014-10-23 NOTE — Telephone Encounter (Signed)
Please call pt. HER stomach Bx shows gastritis. HER NAUSEA AND VOMITING ARE MOST LIKELY DUE TO UNCONTROLLED BLOOD SUGARS, REFLUX, AND GASTRITIS.   SHE SHOULD STRICTLY FOLLOW HER DIABETIC DIET AND TAKE HER DIABETES MEDS/INSULIN. IF NOT SHE IS GOING TO CONTINUE TO HAVE PROBLEMS WITH NAUSEA AND VOMITING AND ABDOMINAL PAIN.  CONTINUE PROTONIX. TAKE 30 MINUTES PRIOR TO MEALS TWICE DAILY.  ZANTAC HELPS MOST WHEN USED AS NEEDED.  FOLLOW UP IN 4 MOS E30 NAUSEA/VOMITING.

## 2014-10-23 NOTE — Telephone Encounter (Signed)
Pt called and was informed of results. She said her right side continues to hurt all of the time. She rates the pain at a 6-7 now. The pain is midway her abdomen on her right side. She continues to have some nausea and vomiting.  Please advise!

## 2014-10-23 NOTE — Telephone Encounter (Signed)
APPOINTMENT MADE °

## 2014-10-25 MED ORDER — ONDANSETRON HCL 4 MG PO TABS: 4.0000 mg | ORAL_TABLET | Freq: Three times a day (TID) | ORAL | Status: DC

## 2014-10-25 NOTE — Addendum Note (Signed)
Addended by: West BaliFIELDS, [name] Vazguez L on: 10/25/2014 01:35 PM   Modules accepted: Orders

## 2014-10-25 NOTE — Telephone Encounter (Addendum)
PLEASE CALL PT. Her SIDE PAIN IS MOST LIKELY DUE TO ABDOMINAL WALL PAIN. ICE TID TO R SIDE. SHE MAY USE TYLENOL PRN PAIN. USE ZOFRAN TO CONTROL NAUSEA AND VOMITING.

## 2014-10-25 NOTE — Telephone Encounter (Signed)
Pt is aware.  

## 2014-11-08 DIAGNOSIS — S161XXS Strain of muscle, fascia and tendon at neck level, sequela: Secondary | ICD-10-CM | POA: Diagnosis not present

## 2014-11-08 DIAGNOSIS — H6123 Impacted cerumen, bilateral: Secondary | ICD-10-CM | POA: Diagnosis not present

## 2014-11-28 DIAGNOSIS — F419 Anxiety disorder, unspecified: Secondary | ICD-10-CM | POA: Diagnosis not present

## 2014-11-28 DIAGNOSIS — Z1389 Encounter for screening for other disorder: Secondary | ICD-10-CM | POA: Diagnosis not present

## 2014-11-28 DIAGNOSIS — E1140 Type 2 diabetes mellitus with diabetic neuropathy, unspecified: Secondary | ICD-10-CM | POA: Diagnosis not present

## 2014-11-28 DIAGNOSIS — F328 Other depressive episodes: Secondary | ICD-10-CM | POA: Diagnosis not present

## 2014-11-28 DIAGNOSIS — Z23 Encounter for immunization: Secondary | ICD-10-CM | POA: Diagnosis not present

## 2014-11-28 DIAGNOSIS — E1165 Type 2 diabetes mellitus with hyperglycemia: Secondary | ICD-10-CM | POA: Diagnosis not present

## 2014-11-28 DIAGNOSIS — G8929 Other chronic pain: Secondary | ICD-10-CM | POA: Diagnosis not present

## 2014-12-21 DIAGNOSIS — M542 Cervicalgia: Secondary | ICD-10-CM | POA: Diagnosis not present

## 2014-12-21 DIAGNOSIS — M546 Pain in thoracic spine: Secondary | ICD-10-CM | POA: Diagnosis not present

## 2014-12-26 DIAGNOSIS — M542 Cervicalgia: Secondary | ICD-10-CM | POA: Diagnosis not present

## 2014-12-26 DIAGNOSIS — M546 Pain in thoracic spine: Secondary | ICD-10-CM | POA: Diagnosis not present

## 2014-12-31 DIAGNOSIS — M546 Pain in thoracic spine: Secondary | ICD-10-CM | POA: Diagnosis not present

## 2014-12-31 DIAGNOSIS — M542 Cervicalgia: Secondary | ICD-10-CM | POA: Diagnosis not present

## 2015-01-02 DIAGNOSIS — M546 Pain in thoracic spine: Secondary | ICD-10-CM | POA: Diagnosis not present

## 2015-01-02 DIAGNOSIS — M542 Cervicalgia: Secondary | ICD-10-CM | POA: Diagnosis not present

## 2015-01-08 DIAGNOSIS — M546 Pain in thoracic spine: Secondary | ICD-10-CM | POA: Diagnosis not present

## 2015-01-08 DIAGNOSIS — M542 Cervicalgia: Secondary | ICD-10-CM | POA: Diagnosis not present

## 2015-01-10 DIAGNOSIS — M542 Cervicalgia: Secondary | ICD-10-CM | POA: Diagnosis not present

## 2015-01-10 DIAGNOSIS — M546 Pain in thoracic spine: Secondary | ICD-10-CM | POA: Diagnosis not present

## 2015-01-14 DIAGNOSIS — M546 Pain in thoracic spine: Secondary | ICD-10-CM | POA: Diagnosis not present

## 2015-01-14 DIAGNOSIS — M542 Cervicalgia: Secondary | ICD-10-CM | POA: Diagnosis not present

## 2015-01-16 DIAGNOSIS — M546 Pain in thoracic spine: Secondary | ICD-10-CM | POA: Diagnosis not present

## 2015-01-16 DIAGNOSIS — M542 Cervicalgia: Secondary | ICD-10-CM | POA: Diagnosis not present

## 2015-01-23 DIAGNOSIS — M546 Pain in thoracic spine: Secondary | ICD-10-CM | POA: Diagnosis not present

## 2015-01-23 DIAGNOSIS — M542 Cervicalgia: Secondary | ICD-10-CM | POA: Diagnosis not present

## 2015-01-25 DIAGNOSIS — M542 Cervicalgia: Secondary | ICD-10-CM | POA: Diagnosis not present

## 2015-01-25 DIAGNOSIS — M546 Pain in thoracic spine: Secondary | ICD-10-CM | POA: Diagnosis not present

## 2015-01-29 DIAGNOSIS — M542 Cervicalgia: Secondary | ICD-10-CM | POA: Diagnosis not present

## 2015-01-29 DIAGNOSIS — M546 Pain in thoracic spine: Secondary | ICD-10-CM | POA: Diagnosis not present

## 2015-02-04 DIAGNOSIS — M546 Pain in thoracic spine: Secondary | ICD-10-CM | POA: Diagnosis not present

## 2015-02-04 DIAGNOSIS — M542 Cervicalgia: Secondary | ICD-10-CM | POA: Diagnosis not present

## 2015-02-08 ENCOUNTER — Ambulatory Visit: Payer: Medicare Other | Admitting: Gastroenterology

## 2015-02-11 DIAGNOSIS — M542 Cervicalgia: Secondary | ICD-10-CM | POA: Diagnosis not present

## 2015-02-11 DIAGNOSIS — M546 Pain in thoracic spine: Secondary | ICD-10-CM | POA: Diagnosis not present

## 2015-02-14 ENCOUNTER — Encounter: Payer: Self-pay | Admitting: Gastroenterology

## 2015-02-14 ENCOUNTER — Ambulatory Visit (INDEPENDENT_AMBULATORY_CARE_PROVIDER_SITE_OTHER): Payer: Medicare Other | Admitting: Gastroenterology

## 2015-02-14 VITALS — BP 118/75 | HR 89 | Temp 96.8°F | Ht 65.0 in | Wt 177.8 lb

## 2015-02-14 DIAGNOSIS — R1011 Right upper quadrant pain: Secondary | ICD-10-CM | POA: Diagnosis not present

## 2015-02-14 DIAGNOSIS — K750 Abscess of liver: Secondary | ICD-10-CM

## 2015-02-14 MED ORDER — LIDOCAINE 5 % EX PTCH: 1.0000 | MEDICATED_PATCH | CUTANEOUS | Status: DC

## 2015-02-14 NOTE — Patient Instructions (Signed)
We will set up the repeat MRI for you. This should be the last one you have to do.   I have sent in lidoderm patches to your pharmacy. Let me know if this isn't covered. This will be applied to the right-side for 12 hours, then take off for 12 hours.   We have referred you to Pain Management.

## 2015-02-14 NOTE — Progress Notes (Signed)
Referring Provider: Selinda Flavin, MD Primary Care Physician:  Selinda Flavin, MD  Primary GI: Dr. Darrick Penna   Chief Complaint  Patient presents with  . Follow-up    doing about the same as before.    HPI:   [name] is a 44 y.o. female presenting today with a history of chronic RUQ pain since appendectomy several years ago, incidentally found to have an enlarging liver lesion on CT several months ago. MRI then completed showing a small hepatic abscess and markedly fatty liver. Sent for liver abscess biopsy with cultures revealing candida albicans. Discussed with ID, who recommended continuing Augmentin, start Diflucan, and proceed with blood cultures. Blood cultures negative X 2. Repeat MRI in Oct 2015 with almost complete resolution of liver abscess  Recent EGD March 2016 with mild erosive gastritis, empiric dilation. History of chronic constipation, Amitiza 8 mcg po BID.   Zofran a few times a week. Phenergan a few times a week. N/V improved. RUQ pain persists.    Past Medical History  Diagnosis Date  . Peripheral vascular disease   . Hypertension   . Anginal pain   . Anxiety   . Depression   . Asthma   . COPD (chronic obstructive pulmonary disease)   . Shortness of breath   . Tuberculosis   . GERD (gastroesophageal reflux disease)   . Headache(784.0)   . Arthritis   . Diabetes mellitus 01/28/2012  . Anemia 02/05/2012  . PE (pulmonary embolism)     Xarelto    Past Surgical History  Procedure Laterality Date  . Appendectomy    . Cholecystectomy    . Diagnostic laparoscopy    . Esophagogastroduodenoscopy (egd) with propofol N/A 10/09/2014    Dr. Darrick Penna: mild erosive gastritis, Savary dilation. Nausea and vomiting most likely due to uncontroleld blood sugars, reflux, and gastritis.   . Esophageal dilation N/A 10/09/2014    Procedure: ESOPHAGEAL DILATION;  Surgeon: West Bali, MD;  Location: [facility];  Service: Endoscopy;  Laterality: N/A;  #15 and #16 savory     Current Outpatient Prescriptions  Medication Sig Dispense Refill  . ALPRAZolam (XANAX) 0.5 MG tablet Take 0.5 mg by mouth 2 (two) times daily as needed for anxiety.    Marland Kitchen aspirin 325 MG tablet Take 325 mg by mouth daily.    . Calcium Carbonate-Vitamin D (CALCIUM 600 + D PO) Take 1 tablet by mouth daily.     . carvedilol (COREG) 3.125 MG tablet Take 3.125 mg by mouth 2 (two) times daily with a meal.    . gabapentin (NEURONTIN) 600 MG tablet Take 600 mg by mouth 3 (three) times daily.    Marland Kitchen glipiZIDE (GLUCOTROL) 10 MG tablet Take 10 mg by mouth 2 (two) times daily.    . insulin aspart (NOVOLOG) 100 UNIT/ML injection Inject into the skin as needed for high blood sugar (per sliding scale).    . insulin glargine (LANTUS) 100 UNIT/ML injection Inject 20 Units into the skin every morning.    Marland Kitchen lisinopril (PRINIVIL,ZESTRIL) 5 MG tablet Take 5 mg by mouth every morning.     . lubiprostone (AMITIZA) 8 MCG capsule Take 1 capsule (8 mcg total) by mouth 2 (two) times daily with a meal. (Patient taking differently: Take 8 mcg by mouth 2 (two) times daily with a meal. Takes prn) 60 capsule 3  . metFORMIN (GLUCOPHAGE-XR) 500 MG 24 hr tablet Take 1,000 mg by mouth 2 (two) times daily.    . Multiple Vitamins-Minerals (COMPLETE  MULTIVITAMIN/MINERAL PO) Take 1 tablet by mouth daily.     . ondansetron (ZOFRAN) 4 MG tablet Take 1 tablet (4 mg total) by mouth 3 (three) times daily with meals. 90 tablet 1  . oxyCODONE-acetaminophen (PERCOCET/ROXICET) 5-325 MG per tablet Take 1 tablet by mouth every 6 (six) hours as needed for severe pain.    . pantoprazole (PROTONIX) 40 MG tablet Take 1 tablet (40 mg total) by mouth 2 (two) times daily before a meal. 60 tablet 11  . promethazine (PHENERGAN) 25 MG tablet Take 0.5 tablets (12.5 mg total) by mouth every 6 (six) hours as needed for nausea or vomiting. 30 tablet 3  . ranitidine (ZANTAC) 300 MG tablet Take 300 mg by mouth at bedtime as needed for heartburn. *takes if  symptoms not relieved by protonix*    . sertraline (ZOLOFT) 100 MG tablet Take 100 mg by mouth daily.    . simvastatin (ZOCOR) 80 MG tablet Take 80 mg by mouth every evening.    . topiramate (TOPAMAX) 25 MG tablet Take 25 mg by mouth every evening.    . lidocaine (LIDODERM) 5 % Place 1 patch onto the skin daily. Remove & Discard patch within 12 hours or as directed by MD 30 patch 0   No current facility-administered medications for this visit.    Allergies as of 02/14/2015  . (No Known Allergies)    Family History  Problem Relation Age of Onset  . Colon cancer Neg Hx   . Liver disease Neg Hx     History   Social History  . Marital Status: Single    Spouse Name: N/A  . Number of Children: N/A  . Years of Education: N/A   Social History Main Topics  . Smoking status: Current Every Day Smoker -- 1.00 packs/day for 20 years    Types: Cigarettes  . Smokeless tobacco: Not on file     Comment: 1/2 to one pack a day  . Alcohol Use: No  . Drug Use: No  . Sexual Activity: Yes    Birth Control/ Protection: Injection   Other Topics Concern  . None   Social History Narrative    Review of Systems: Negative unless mentioned in HPI  Physical Exam: BP 118/75 mmHg  Pulse 89  Temp(Src) 96.8 F (36 C) (Oral)  Ht  (1.651 m)  Wt 177 lb 12.8 oz (80.65 kg)  BMI 29.59 kg/m2 General:   Alert and oriented. No distress noted. Pleasant and cooperative.  Head:  Normocephalic and atraumatic. Eyes:  Conjuctiva clear without scleral icterus. Abdomen:  +BS, soft, TTP RUQ, +Carnett's sign and non-distended. No rebound or guarding.  Extremities:  Without edema. Neurologic:  Alert and  oriented x4;  grossly normal neurologically. Psych:  Alert and cooperative. Normal mood and affect.

## 2015-02-19 ENCOUNTER — Other Ambulatory Visit: Payer: Self-pay

## 2015-02-19 DIAGNOSIS — K750 Abscess of liver: Secondary | ICD-10-CM

## 2015-02-19 DIAGNOSIS — R109 Unspecified abdominal pain: Secondary | ICD-10-CM

## 2015-02-19 DIAGNOSIS — K769 Liver disease, unspecified: Secondary | ICD-10-CM

## 2015-02-19 NOTE — Assessment & Plan Note (Signed)
44 year old female with chronic RUQ pain since appendectomy several years ago, with interesting history of liver abscess as noted in "liver abscess". EGD completed with mild erosive gastritis, s/p dilation. Symptoms persist and highly suspect adhesive disease as culprit, chronic abdominal pain. Referral to Pain Management for further evaluation. In interim, trial short course of lidoderm patches if covered by insurance. Continue PPI, Zofran and Phenergan prn.

## 2015-02-19 NOTE — Assessment & Plan Note (Signed)
With culture revealing candida albicans in past. No septicemia, blood cultures negative X2. Course completed last year with Diflucan and Augmentin as per ID recommendations. Last MRI in Oct 2015 with near resolution. One final MRI ordered to ensure complete resolution.

## 2015-02-25 DIAGNOSIS — K769 Liver disease, unspecified: Secondary | ICD-10-CM | POA: Diagnosis not present

## 2015-02-25 DIAGNOSIS — K760 Fatty (change of) liver, not elsewhere classified: Secondary | ICD-10-CM | POA: Diagnosis not present

## 2015-02-25 DIAGNOSIS — R109 Unspecified abdominal pain: Secondary | ICD-10-CM | POA: Diagnosis not present

## 2015-02-25 DIAGNOSIS — R112 Nausea with vomiting, unspecified: Secondary | ICD-10-CM | POA: Diagnosis not present

## 2015-02-25 NOTE — Progress Notes (Signed)
CC'ED TO PCP 

## 2015-02-28 NOTE — Progress Notes (Signed)
MRI from 02/25/15 at [facility] reviewed:  Interval resolution of subcapsular lesion in the inferior right hepatic lobe, consistent with resolution of hepatic abscess. Mild residual scarring. Mild fatty liver.   Good news! No further MRI unless clinically indicated.

## 2015-02-28 NOTE — Progress Notes (Signed)
LMOM to call.

## 2015-03-13 ENCOUNTER — Telehealth: Payer: Self-pay

## 2015-03-13 NOTE — Progress Notes (Signed)
LMOM to call.

## 2015-03-13 NOTE — Telephone Encounter (Signed)
Received call from [facility] staff. They stated that they called patient to give her an appointment and the patient told them she had no way to get there. This is the second pain management referral for pt.  Pain specialist in [place] would not see her.   Please advise.

## 2015-03-14 NOTE — Progress Notes (Signed)
Pt is aware.  

## 2015-03-18 DIAGNOSIS — F419 Anxiety disorder, unspecified: Secondary | ICD-10-CM | POA: Diagnosis not present

## 2015-03-18 DIAGNOSIS — E1140 Type 2 diabetes mellitus with diabetic neuropathy, unspecified: Secondary | ICD-10-CM | POA: Diagnosis not present

## 2015-03-18 DIAGNOSIS — F328 Other depressive episodes: Secondary | ICD-10-CM | POA: Diagnosis not present

## 2015-03-18 DIAGNOSIS — G8929 Other chronic pain: Secondary | ICD-10-CM | POA: Diagnosis not present

## 2015-03-18 DIAGNOSIS — E1165 Type 2 diabetes mellitus with hyperglycemia: Secondary | ICD-10-CM | POA: Diagnosis not present

## 2015-04-19 DIAGNOSIS — Z1231 Encounter for screening mammogram for malignant neoplasm of breast: Secondary | ICD-10-CM | POA: Diagnosis not present

## 2015-05-28 DIAGNOSIS — Z23 Encounter for immunization: Secondary | ICD-10-CM | POA: Diagnosis not present

## 2015-05-28 DIAGNOSIS — F419 Anxiety disorder, unspecified: Secondary | ICD-10-CM | POA: Diagnosis not present

## 2015-05-28 DIAGNOSIS — M129 Arthropathy, unspecified: Secondary | ICD-10-CM | POA: Diagnosis not present

## 2015-05-28 DIAGNOSIS — E1165 Type 2 diabetes mellitus with hyperglycemia: Secondary | ICD-10-CM | POA: Diagnosis not present

## 2015-05-28 DIAGNOSIS — R109 Unspecified abdominal pain: Secondary | ICD-10-CM | POA: Diagnosis not present

## 2015-05-28 DIAGNOSIS — E1140 Type 2 diabetes mellitus with diabetic neuropathy, unspecified: Secondary | ICD-10-CM | POA: Diagnosis not present

## 2015-08-05 DIAGNOSIS — R079 Chest pain, unspecified: Secondary | ICD-10-CM | POA: Diagnosis not present

## 2015-08-05 DIAGNOSIS — Z86711 Personal history of pulmonary embolism: Secondary | ICD-10-CM | POA: Diagnosis not present

## 2015-08-05 DIAGNOSIS — F17200 Nicotine dependence, unspecified, uncomplicated: Secondary | ICD-10-CM | POA: Diagnosis not present

## 2015-08-05 DIAGNOSIS — K219 Gastro-esophageal reflux disease without esophagitis: Secondary | ICD-10-CM | POA: Diagnosis not present

## 2015-08-05 DIAGNOSIS — R0789 Other chest pain: Secondary | ICD-10-CM | POA: Diagnosis not present

## 2015-08-05 DIAGNOSIS — Z79899 Other long term (current) drug therapy: Secondary | ICD-10-CM | POA: Diagnosis not present

## 2015-08-05 DIAGNOSIS — E119 Type 2 diabetes mellitus without complications: Secondary | ICD-10-CM | POA: Diagnosis not present

## 2015-08-05 DIAGNOSIS — I10 Essential (primary) hypertension: Secondary | ICD-10-CM | POA: Diagnosis not present

## 2015-08-05 DIAGNOSIS — I739 Peripheral vascular disease, unspecified: Secondary | ICD-10-CM | POA: Diagnosis not present

## 2015-08-07 DIAGNOSIS — Z95828 Presence of other vascular implants and grafts: Secondary | ICD-10-CM | POA: Diagnosis not present

## 2015-08-07 DIAGNOSIS — I739 Peripheral vascular disease, unspecified: Secondary | ICD-10-CM | POA: Diagnosis not present

## 2015-09-11 DIAGNOSIS — M62262 Nontraumatic ischemic infarction of muscle, left lower leg: Secondary | ICD-10-CM | POA: Diagnosis not present

## 2015-09-11 DIAGNOSIS — K219 Gastro-esophageal reflux disease without esophagitis: Secondary | ICD-10-CM | POA: Diagnosis not present

## 2015-09-11 DIAGNOSIS — I2699 Other pulmonary embolism without acute cor pulmonale: Secondary | ICD-10-CM | POA: Diagnosis not present

## 2015-09-11 DIAGNOSIS — I10 Essential (primary) hypertension: Secondary | ICD-10-CM | POA: Diagnosis not present

## 2015-09-11 DIAGNOSIS — G4489 Other headache syndrome: Secondary | ICD-10-CM | POA: Diagnosis not present

## 2015-09-11 DIAGNOSIS — B354 Tinea corporis: Secondary | ICD-10-CM | POA: Diagnosis not present

## 2015-09-11 DIAGNOSIS — E1165 Type 2 diabetes mellitus with hyperglycemia: Secondary | ICD-10-CM | POA: Diagnosis not present

## 2015-09-11 DIAGNOSIS — F419 Anxiety disorder, unspecified: Secondary | ICD-10-CM | POA: Diagnosis not present

## 2015-09-11 DIAGNOSIS — F3289 Other specified depressive episodes: Secondary | ICD-10-CM | POA: Diagnosis not present

## 2015-09-11 DIAGNOSIS — Z0001 Encounter for general adult medical examination with abnormal findings: Secondary | ICD-10-CM | POA: Diagnosis not present

## 2015-09-11 DIAGNOSIS — E1140 Type 2 diabetes mellitus with diabetic neuropathy, unspecified: Secondary | ICD-10-CM | POA: Diagnosis not present

## 2015-12-04 DIAGNOSIS — F330 Major depressive disorder, recurrent, mild: Secondary | ICD-10-CM | POA: Diagnosis not present

## 2015-12-04 DIAGNOSIS — E1165 Type 2 diabetes mellitus with hyperglycemia: Secondary | ICD-10-CM | POA: Diagnosis not present

## 2015-12-04 DIAGNOSIS — F419 Anxiety disorder, unspecified: Secondary | ICD-10-CM | POA: Diagnosis not present

## 2015-12-04 DIAGNOSIS — K219 Gastro-esophageal reflux disease without esophagitis: Secondary | ICD-10-CM | POA: Diagnosis not present

## 2015-12-04 DIAGNOSIS — Z1389 Encounter for screening for other disorder: Secondary | ICD-10-CM | POA: Diagnosis not present

## 2015-12-04 DIAGNOSIS — E1140 Type 2 diabetes mellitus with diabetic neuropathy, unspecified: Secondary | ICD-10-CM | POA: Diagnosis not present

## 2015-12-04 DIAGNOSIS — I10 Essential (primary) hypertension: Secondary | ICD-10-CM | POA: Diagnosis not present

## 2015-12-04 DIAGNOSIS — R1084 Generalized abdominal pain: Secondary | ICD-10-CM | POA: Diagnosis not present

## 2015-12-06 DIAGNOSIS — R1084 Generalized abdominal pain: Secondary | ICD-10-CM | POA: Diagnosis not present

## 2015-12-06 DIAGNOSIS — G8929 Other chronic pain: Secondary | ICD-10-CM | POA: Diagnosis not present

## 2015-12-06 DIAGNOSIS — R109 Unspecified abdominal pain: Secondary | ICD-10-CM | POA: Diagnosis not present

## 2015-12-06 DIAGNOSIS — K219 Gastro-esophageal reflux disease without esophagitis: Secondary | ICD-10-CM | POA: Diagnosis not present

## 2016-02-13 ENCOUNTER — Ambulatory Visit: Payer: Medicare Other | Admitting: Nurse Practitioner

## 2016-03-09 DIAGNOSIS — R109 Unspecified abdominal pain: Secondary | ICD-10-CM | POA: Diagnosis not present

## 2016-03-09 DIAGNOSIS — M62262 Nontraumatic ischemic infarction of muscle, left lower leg: Secondary | ICD-10-CM | POA: Diagnosis not present

## 2016-03-09 DIAGNOSIS — F419 Anxiety disorder, unspecified: Secondary | ICD-10-CM | POA: Diagnosis not present

## 2016-03-09 DIAGNOSIS — M47812 Spondylosis without myelopathy or radiculopathy, cervical region: Secondary | ICD-10-CM | POA: Diagnosis not present

## 2016-03-09 DIAGNOSIS — G4489 Other headache syndrome: Secondary | ICD-10-CM | POA: Diagnosis not present

## 2016-03-09 DIAGNOSIS — Z6826 Body mass index (BMI) 26.0-26.9, adult: Secondary | ICD-10-CM | POA: Diagnosis not present

## 2016-03-09 DIAGNOSIS — E1140 Type 2 diabetes mellitus with diabetic neuropathy, unspecified: Secondary | ICD-10-CM | POA: Diagnosis not present

## 2016-03-12 ENCOUNTER — Encounter: Payer: Self-pay | Admitting: Nurse Practitioner

## 2016-03-12 ENCOUNTER — Ambulatory Visit (INDEPENDENT_AMBULATORY_CARE_PROVIDER_SITE_OTHER): Payer: Medicare Other | Admitting: Nurse Practitioner

## 2016-03-12 VITALS — BP 124/79 | HR 88 | Temp 97.4°F | Ht 65.5 in | Wt 161.6 lb

## 2016-03-12 DIAGNOSIS — K750 Abscess of liver: Secondary | ICD-10-CM

## 2016-03-12 DIAGNOSIS — R112 Nausea with vomiting, unspecified: Secondary | ICD-10-CM

## 2016-03-12 DIAGNOSIS — R1314 Dysphagia, pharyngoesophageal phase: Secondary | ICD-10-CM

## 2016-03-12 DIAGNOSIS — K5900 Constipation, unspecified: Secondary | ICD-10-CM

## 2016-03-12 DIAGNOSIS — R1011 Right upper quadrant pain: Secondary | ICD-10-CM | POA: Diagnosis not present

## 2016-03-12 MED ORDER — LINACLOTIDE 145 MCG PO CAPS: 145.0000 ug | ORAL_CAPSULE | Freq: Every day | ORAL | 0 refills | Status: DC

## 2016-03-12 MED ORDER — PANTOPRAZOLE SODIUM 40 MG PO TBEC: 40.0000 mg | DELAYED_RELEASE_TABLET | Freq: Two times a day (BID) | ORAL | 11 refills | Status: DC

## 2016-03-12 MED ORDER — PROMETHAZINE HCL 25 MG PO TABS: 12.5000 mg | ORAL_TABLET | Freq: Four times a day (QID) | ORAL | 3 refills | Status: DC | PRN

## 2016-03-12 MED ORDER — ONDANSETRON HCL 4 MG PO TABS: 4.0000 mg | ORAL_TABLET | Freq: Three times a day (TID) | ORAL | 2 refills | Status: DC

## 2016-03-12 MED ORDER — LIDOCAINE 5 % EX PTCH
1.0000 | MEDICATED_PATCH | CUTANEOUS | 1 refills | Status: DC
Start: 2016-03-12 — End: 2016-08-19

## 2016-03-12 NOTE — Assessment & Plan Note (Signed)
Issue with persistent nausea and vomiting. Query element of gastroparesis. She has run out of her Zofran and Phenergan and did not call for refills. Today I'll refill her anti-emetics and schedule gastric emptying study to further evaluate.

## 2016-03-12 NOTE — Progress Notes (Signed)
Referring Provider: Selinda FlavinHoward, Kevin, MD Primary Care Physician:  Selinda FlavinHOWARD, KEVIN, MD Primary GI:  Dr. Darrick PennaFields  Chief Complaint  Patient presents with  . Abdominal Pain  . Emesis  . Constipation    bm q 3-4 days, small amt    HPI:   Sharon BillingsKaren D [name] is a 45 y.o. female who presents For abdominal pain, emesis, constipation. Last seen in our office 02/14/2015 with a history of chronic right upper quadrant pain since appendectomy several years ago and noted enlarging liver lesion on CT. MRI completed showing small hepatic abscess and marketed fatty liver. Liver abscess biopsy with cultures revealed candida albicans. Consulted infectious disease recommended continuing Augmentin, start Diflucan and proceed up blood cultures which were negative 2. Repeat MRI in 2015 with near-complete resolution of abscess.  Last EGD March 2016 with nonerosive gastritis, empiric dilation. Chronic constipation on Amitiza 8 g by mouth twice a day. Abdominal pain was persistent at her last visit.  At her last visit a final MRI was ordered to document resolution of liver abscess. Persistent right upper quadrant pain with suspected adhesive disease and she was referred to pain management for further evaluation and given a short course of Lidoderm patches in the interim. Recommended continue PPI, Zofran, Phenergan.  There is a line entry for MRI of the abdomen with and without contrast on 02/25/2015 but there is no result report available, query if the MRI was completed.  Today she states she's still having abdominal pain. She was told by her PCP that she's not sure if her insurance will pay for pain management. MRI report reviewed, documents interval resolution of liver abscess. Has generalized abdominal pain, pain is described as crampy and constant ache. Still with constipation, typically 2-3 days between bowel movements with minimal stools. Also with N/V recently after eating. Occasional heartburn at least once a week.  Also with dysphagia. Denies hematochezia, melena. Denies chest pain, dyspnea, dizziness, lightheadedness, syncope, near syncope. Denies any other upper or lower GI symptoms.  States Lidoderm patches helped some, but no refills were ordered so she ran out. Ran out of Amitiza but doesn't think it's helped. Also ran out of Protonix. Also ran out of Zofran and Phenergan.  Past Medical History:  Diagnosis Date  . Anemia 02/05/2012  . Anginal pain (HCC)   . Anxiety   . Arthritis   . Asthma   . COPD (chronic obstructive pulmonary disease) (HCC)   . Depression   . Diabetes mellitus (HCC) 01/28/2012  . GERD (gastroesophageal reflux disease)   . Headache(784.0)   . Hypertension   . PE (pulmonary embolism)    Xarelto  . Peripheral vascular disease (HCC)   . Shortness of breath   . Tuberculosis     Past Surgical History:  Procedure Laterality Date  . APPENDECTOMY    . CHOLECYSTECTOMY    . DIAGNOSTIC LAPAROSCOPY    . ESOPHAGEAL DILATION N/A 10/09/2014   Procedure: ESOPHAGEAL DILATION;  Surgeon: West BaliSandi L Fields, MD;  Location: [facility];  Service: Endoscopy;  Laterality: N/A;  #15 and #16 savory  . ESOPHAGOGASTRODUODENOSCOPY (EGD) WITH PROPOFOL N/A 10/09/2014   Dr. Darrick PennaFields: mild erosive gastritis, Savary dilation. Nausea and vomiting most likely due to uncontroleld blood sugars, reflux, and gastritis.     Current Outpatient Prescriptions  Medication Sig Dispense Refill  . ALPRAZolam (XANAX) 0.5 MG tablet Take 0.5 mg by mouth 2 (two) times daily as needed for anxiety.    . Calcium Carbonate-Vitamin D (CALCIUM 600 + D  PO) Take 1 tablet by mouth daily.     . carvedilol (COREG) 3.125 MG tablet Take 3.125 mg by mouth 2 (two) times daily with a meal.    . gabapentin (NEURONTIN) 600 MG tablet Take 600 mg by mouth 4 (four) times daily.     Marland Kitchen. glipiZIDE (GLUCOTROL) 10 MG tablet Take 10 mg by mouth 2 (two) times daily.    . insulin aspart (NOVOLOG) 100 UNIT/ML injection Inject into the skin as needed for high  blood sugar (per sliding scale).    . insulin glargine (LANTUS) 100 UNIT/ML injection Inject 20 Units into the skin every morning.    Marland Kitchen. lisinopril (PRINIVIL,ZESTRIL) 5 MG tablet Take 5 mg by mouth every morning.     . metFORMIN (GLUCOPHAGE-XR) 500 MG 24 hr tablet Take 1,000 mg by mouth 2 (two) times daily.    . Multiple Vitamins-Minerals (COMPLETE MULTIVITAMIN/MINERAL PO) Take 1 tablet by mouth daily.     Marland Kitchen. oxyCODONE-acetaminophen (PERCOCET/ROXICET) 5-325 MG per tablet Take 1 tablet by mouth every 6 (six) hours as needed for severe pain.    . ranitidine (ZANTAC) 300 MG tablet Take 300 mg by mouth at bedtime as needed for heartburn. *takes if symptoms not relieved by protonix*    . topiramate (TOPAMAX) 25 MG tablet Take 25 mg by mouth every evening.    Carlena Hurl. XARELTO 20 MG TABS tablet Take 1 tablet by mouth daily.     No current facility-administered medications for this visit.     Allergies as of 03/12/2016  . (No Known Allergies)    Family History  Problem Relation Age of Onset  . Colon cancer Neg Hx   . Liver disease Neg Hx     Social History   Social History  . Marital status: Single    Spouse name: N/A  . Number of children: N/A  . Years of education: N/A   Social History Main Topics  . Smoking status: Current Every Day Smoker    Packs/day: 1.00    Years: 20.00    Types: Cigarettes  . Smokeless tobacco: Never Used  . Alcohol use No  . Drug use: No  . Sexual activity: Yes    Birth control/ protection: Injection   Other Topics Concern  . None   Social History Narrative  . None    Review of Systems: 10-point ROS negative except as per HPI.   Physical Exam: BP 124/79   Pulse 88   Temp 97.4 F (36.3 C) (Oral)   Ht 5' 5.5" (1.664 m)   Wt 161 lb 9.6 oz (73.3 kg)   BMI 26.48 kg/m  General:   Alert and oriented. Pleasant and cooperative. Well-nourished and well-developed.  Eyes:  Without icterus, sclera clear and conjunctiva pink.  Ears:  Normal auditory  acuity. Cardiovascular:  S1, S2 present without murmurs appreciated. Normal pulses noted. Extremities without clubbing or edema. Respiratory:  Clear to auscultation bilaterally. No wheezes, rales, or rhonchi. No distress.  Gastrointestinal:  +BS, soft, and non-distended. Generalized abdominal TTP. No HSM noted. No guarding or rebound. No masses appreciated.  Rectal:  Deferred  Musculoskalatal:  Symmetrical without gross deformities. Skin:  Intact without significant lesions or rashes. Neurologic:  Alert and oriented x4;  grossly normal neurologically. Psych:  Alert and cooperative. Normal mood and affect. Heme/Lymph/Immune: No excessive bruising noted.    03/12/2016 10:42 AM   Disclaimer: This note was dictated with voice recognition software. Similar sounding words can inadvertently be transcribed and may not be corrected  upon review.

## 2016-03-12 NOTE — Patient Instructions (Signed)
1. I have refilled Zofran and Phenergan for her nausea. 2. I refilled a lidocaine patch for your pain. 3. I refilled her Protonix for acid reflux. 4. I will give you samples of Linzess 145 g pills. Take 1, once a day on an empty stomach. 5. Call us with a progress report on how the Linzess helping your constipation in 2 weeks. 6. I've ordered a gastric emptying study to evaluate for slow emptying stomach as a possible cause severe nausea and vomiting. 7. Return for follow-up in 2 months.

## 2016-03-12 NOTE — Progress Notes (Signed)
cc'ed to pcp °

## 2016-03-12 NOTE — Assessment & Plan Note (Signed)
And to need constipation, Amitiza 8 g did not help much. His only otherwise tried over-the-counter medications. I'll trial her on 145 g Linzess with samples for 2 weeks and request a progress report in 2 weeks. Return for follow-up in 2 months.

## 2016-03-12 NOTE — Assessment & Plan Note (Signed)
Continued abdominal pain. She has run out of Protonix and I called for refill. I'll refill her Protonix today. Also ran out of Lidoderm patches which she felt helped. We'll reorder these as well. At last visit deemed likely adhesive disease. She was referred to pain management but told she was not sure if her insurance to pay for that. It would likely be beneficial to refer her to pain management again at her next visit and allow pain management determine if she has insurance coverage for this. Return for follow-up in 2 months.

## 2016-03-12 NOTE — Assessment & Plan Note (Signed)
Noted interval resolution of liver abscess on MRI completed [facility].

## 2016-03-12 NOTE — Assessment & Plan Note (Signed)
Notes some continued dysphagia symptoms. Last EGD about 1 year ago with empiric dilation. I will order a gastric emptying study to further evaluate for narrowing or stricture. Return for follow-up in 2 months.

## 2016-03-17 ENCOUNTER — Telehealth: Payer: Self-pay | Admitting: Gastroenterology

## 2016-03-17 DIAGNOSIS — K5900 Constipation, unspecified: Secondary | ICD-10-CM

## 2016-03-17 DIAGNOSIS — R1084 Generalized abdominal pain: Secondary | ICD-10-CM

## 2016-03-17 NOTE — Telephone Encounter (Signed)
Pt is taking Linzess 145 mcg and she is having cramps in lower abd and middle. She is have a small amount diarrhea. Please advise

## 2016-03-17 NOTE — Telephone Encounter (Signed)
[phone]  ERIC CALLED IN LIDOCAINE PATCH AND HER PHARMACY STATES THAT HER INSURANCE NEEDS PRE AUTH, ALSO WOULD LIKE TO SPEAK TO THE NURSE ABOUT THE LINZESS SAMPLES SHE GOT HERE.

## 2016-03-18 NOTE — Telephone Encounter (Signed)
Hold the Linzess. Let's do an abdominal XRay 1-view for stool burden (I put in the order, she can walk-in at [facility]).  Please ask Raynelle FanningJulie to work on [employer] I[employer]PA for [employer]idoderm patch  We will call her with Herby AbrahamXRay results and further recommendations. Will likely need to change her constipation medicine dose or switch to a different medication.

## 2016-03-18 NOTE — Addendum Note (Signed)
Addended by: Delane GingerGILL, Jazmeen Axtell A on: 03/18/2016 11:37 AM   Modules accepted: Orders

## 2016-03-19 NOTE — Telephone Encounter (Signed)
LMOM for pt to call as soon as she gets this message.

## 2016-03-20 NOTE — Telephone Encounter (Signed)
LMOM that I have been unable to reach her but to please go by the hospital for the X-Ray that [name] has ordered. Call me back if she gets this before noon today, since we leave at noon on Fridays.

## 2016-03-23 ENCOUNTER — Ambulatory Visit (HOSPITAL_COMMUNITY)
Admission: RE | Admit: 2016-03-23 | Discharge: 2016-03-23 | Disposition: A | Discharge status: Home / Self Care | Payer: Medicare Other | Source: Ambulatory Visit | Attending: Nurse Practitioner | Admitting: Nurse Practitioner

## 2016-03-23 ENCOUNTER — Encounter (HOSPITAL_COMMUNITY): Payer: Self-pay

## 2016-03-23 ENCOUNTER — Encounter (HOSPITAL_COMMUNITY)
Admission: RE | Admit: 2016-03-23 | Discharge: 2016-03-23 | Disposition: A | Discharge status: Home / Self Care | Payer: Medicare Other | Source: Ambulatory Visit | Attending: Nurse Practitioner | Admitting: Nurse Practitioner

## 2016-03-23 DIAGNOSIS — R1084 Generalized abdominal pain: Secondary | ICD-10-CM | POA: Insufficient documentation

## 2016-03-23 DIAGNOSIS — R112 Nausea with vomiting, unspecified: Secondary | ICD-10-CM | POA: Insufficient documentation

## 2016-03-23 DIAGNOSIS — K5900 Constipation, unspecified: Secondary | ICD-10-CM | POA: Diagnosis not present

## 2016-03-23 DIAGNOSIS — I70298 Other atherosclerosis of native arteries of extremities, other extremity: Secondary | ICD-10-CM | POA: Insufficient documentation

## 2016-03-23 DIAGNOSIS — R6881 Early satiety: Secondary | ICD-10-CM | POA: Diagnosis not present

## 2016-03-23 MED ORDER — TECHNETIUM TC 99M SULFUR COLLOID
2.0000 | Freq: Once | INTRAVENOUS | Status: AC | PRN
  Administered 2016-03-23: 2 via ORAL

## 2016-03-23 NOTE — Telephone Encounter (Signed)
She needs a purge. Significantly constipated but no evidence of obstruction.  Take Miralax 1 capful every hour followed by full glass of water up to 6 doses. Then, tomorrow, start Linzess 145 mcg once daily. She will likely need the 290 mcg dosage in the future, but let's start with the 145.Marland Kitchen. She may or may not have loose stool with this. This is a classic side effect of Linzess. Needs to give it a full week to trial. Further recommendations per Minerva AreolaEric.

## 2016-03-23 NOTE — Telephone Encounter (Signed)
LMOM for a return call.  

## 2016-03-23 NOTE — Telephone Encounter (Signed)
Forwarding to Sharon BattenAnna Booth, NP in the absence of Wynne DustEric Gill, NP.  Please review the X-ray results and make recommendations.

## 2016-03-24 NOTE — Telephone Encounter (Signed)
LMOM to call.

## 2016-03-24 NOTE — Telephone Encounter (Signed)
Pt is aware.  

## 2016-03-25 NOTE — Telephone Encounter (Signed)
Tried to do a PA for lidoderm patch. It was denied. They will only approve it if the pt has: pain associated with diabetic neuropathy, pain associated with cancer related neuropathy or post herpactic neuralgia. A letter can be done and faxed to them for an appeal if you would want to do that.

## 2016-03-26 ENCOUNTER — Other Ambulatory Visit: Payer: Self-pay

## 2016-03-26 MED ORDER — LINACLOTIDE 145 MCG PO CAPS: 145.0000 ug | ORAL_CAPSULE | Freq: Every day | ORAL | 2 refills | Status: DC

## 2016-03-26 NOTE — Progress Notes (Signed)
LMOM to call.

## 2016-03-27 ENCOUNTER — Telehealth: Payer: Self-pay | Admitting: Gastroenterology

## 2016-03-27 NOTE — Telephone Encounter (Signed)
Minerva AreolaEric , please send the prescription. Looks like it was entered as Samples.  Thanks!

## 2016-03-27 NOTE — Telephone Encounter (Signed)
[facility] in [place] hasn't received patient's prescription of Linzess yet. Please send. Pt was offered samples, but she can't get here before we close at noon.

## 2016-03-27 NOTE — Progress Notes (Signed)
PT said the Linzess 145 mcg has helped her although she still only has a BM about once every other day. Please advise!

## 2016-03-27 NOTE — Progress Notes (Signed)
Pt is aware.  

## 2016-04-01 MED ORDER — LINACLOTIDE 145 MCG PO CAPS: 145.0000 ug | ORAL_CAPSULE | Freq: Every day | ORAL | 3 refills | Status: DC

## 2016-04-01 NOTE — Telephone Encounter (Signed)
Please tell the patient her insurance will not cover Lidoderm except for a few specific reasons, none of which she has. Keep her follow-up appointment, keep taking Protonix. Call with questions/concerns.

## 2016-04-01 NOTE — Telephone Encounter (Signed)
LMOM for pt that it has been sent in.  

## 2016-04-01 NOTE — Telephone Encounter (Signed)
Looks like it was already sent in on 8/31 but I sent in again for a 90 day supply.

## 2016-04-01 NOTE — Addendum Note (Signed)
Addended by: Gelene MinkBOONE, Dominigue Gellner W on: 04/01/2016 12:14 PM   Modules accepted: Orders

## 2016-04-02 NOTE — Progress Notes (Signed)
LMOM to call.

## 2016-04-03 NOTE — Progress Notes (Signed)
Pt is aware of the plan and also aware per Raynelle FanningJulie that her insurance will not cover the Lidoderm patches.

## 2016-04-03 NOTE — Telephone Encounter (Signed)
[name] informed pt.

## 2016-04-09 DIAGNOSIS — K219 Gastro-esophageal reflux disease without esophagitis: Secondary | ICD-10-CM | POA: Diagnosis not present

## 2016-04-09 DIAGNOSIS — I10 Essential (primary) hypertension: Secondary | ICD-10-CM | POA: Diagnosis not present

## 2016-04-09 DIAGNOSIS — E1140 Type 2 diabetes mellitus with diabetic neuropathy, unspecified: Secondary | ICD-10-CM | POA: Diagnosis not present

## 2016-04-09 DIAGNOSIS — E1165 Type 2 diabetes mellitus with hyperglycemia: Secondary | ICD-10-CM | POA: Diagnosis not present

## 2016-04-09 DIAGNOSIS — F330 Major depressive disorder, recurrent, mild: Secondary | ICD-10-CM | POA: Diagnosis not present

## 2016-04-09 DIAGNOSIS — F419 Anxiety disorder, unspecified: Secondary | ICD-10-CM | POA: Diagnosis not present

## 2016-04-09 DIAGNOSIS — Z6826 Body mass index (BMI) 26.0-26.9, adult: Secondary | ICD-10-CM | POA: Diagnosis not present

## 2016-04-09 DIAGNOSIS — E1143 Type 2 diabetes mellitus with diabetic autonomic (poly)neuropathy: Secondary | ICD-10-CM | POA: Diagnosis not present

## 2016-04-17 DIAGNOSIS — M542 Cervicalgia: Secondary | ICD-10-CM | POA: Diagnosis not present

## 2016-04-21 DIAGNOSIS — Z1231 Encounter for screening mammogram for malignant neoplasm of breast: Secondary | ICD-10-CM | POA: Diagnosis not present

## 2016-04-22 DIAGNOSIS — M542 Cervicalgia: Secondary | ICD-10-CM | POA: Diagnosis not present

## 2016-04-28 DIAGNOSIS — M542 Cervicalgia: Secondary | ICD-10-CM | POA: Diagnosis not present

## 2016-05-01 DIAGNOSIS — M542 Cervicalgia: Secondary | ICD-10-CM | POA: Diagnosis not present

## 2016-05-05 DIAGNOSIS — M542 Cervicalgia: Secondary | ICD-10-CM | POA: Diagnosis not present

## 2016-05-08 DIAGNOSIS — M542 Cervicalgia: Secondary | ICD-10-CM | POA: Diagnosis not present

## 2016-05-11 DIAGNOSIS — M542 Cervicalgia: Secondary | ICD-10-CM | POA: Diagnosis not present

## 2016-05-12 ENCOUNTER — Ambulatory Visit (INDEPENDENT_AMBULATORY_CARE_PROVIDER_SITE_OTHER): Payer: Medicare Other | Admitting: Gastroenterology

## 2016-05-12 ENCOUNTER — Encounter: Payer: Self-pay | Admitting: Gastroenterology

## 2016-05-12 ENCOUNTER — Other Ambulatory Visit: Payer: Self-pay

## 2016-05-12 VITALS — BP 123/78 | HR 92 | Temp 97.4°F | Ht 65.0 in | Wt 165.6 lb

## 2016-05-12 DIAGNOSIS — R1011 Right upper quadrant pain: Secondary | ICD-10-CM | POA: Diagnosis not present

## 2016-05-12 DIAGNOSIS — K5900 Constipation, unspecified: Secondary | ICD-10-CM | POA: Diagnosis not present

## 2016-05-12 DIAGNOSIS — R1115 Cyclical vomiting syndrome unrelated to migraine: Secondary | ICD-10-CM

## 2016-05-12 DIAGNOSIS — G43A0 Cyclical vomiting, not intractable: Secondary | ICD-10-CM

## 2016-05-12 DIAGNOSIS — G8929 Other chronic pain: Secondary | ICD-10-CM

## 2016-05-12 NOTE — Progress Notes (Signed)
Referring Provider: Selinda Flavin, MD Primary Care Physician:  Selinda Flavin, MD Primary GI: Dr. Darrick Penna   Chief Complaint  Patient presents with  . Abdominal Pain    HPI:   [name] is a 45 y.o. female presenting today with a history of chronic abdominal pain, constipation, dysphagia, N/V. Recent GES completed and normal. Last EGD in March 2016 with empiric dilation.   Chronic abdominal pain. Linzess 145 mcg too strong. Has diarrhea with it. If doesn't take it, will be constipated. Had an appt for pain management in [place] but it was too far. Not vomiting as much, less nausea since taking Zofran. Protonix BID.   Past Medical History:  Diagnosis Date  . Anemia 02/05/2012  . Anginal pain (HCC)   . Anxiety   . Arthritis   . Asthma   . COPD (chronic obstructive pulmonary disease) (HCC)   . Depression   . Diabetes mellitus (HCC) 01/28/2012  . GERD (gastroesophageal reflux disease)   . Headache(784.0)   . Hypertension   . PE (pulmonary embolism)    Xarelto  . Peripheral vascular disease (HCC)   . Shortness of breath   . Tuberculosis     Past Surgical History:  Procedure Laterality Date  . APPENDECTOMY    . CHOLECYSTECTOMY    . DIAGNOSTIC LAPAROSCOPY    . ESOPHAGEAL DILATION N/A 10/09/2014   Procedure: ESOPHAGEAL DILATION;  Surgeon: West Bali, MD;  Location: [facility];  Service: Endoscopy;  Laterality: N/A;  #15 and #16 savory  . ESOPHAGOGASTRODUODENOSCOPY (EGD) WITH PROPOFOL N/A 10/09/2014   Dr. Darrick Penna: mild erosive gastritis, Savary dilation. Nausea and vomiting most likely due to uncontroleld blood sugars, reflux, and gastritis.     Current Outpatient Prescriptions  Medication Sig Dispense Refill  . ALPRAZolam (XANAX) 0.5 MG tablet Take 0.5 mg by mouth 2 (two) times daily as needed for anxiety.    . Calcium Carbonate-Vitamin D (CALCIUM 600 + D PO) Take 1 tablet by mouth daily.     . carvedilol (COREG) 3.125 MG tablet Take 3.125 mg by mouth 2 (two) times  daily with a meal.    . gabapentin (NEURONTIN) 600 MG tablet Take 600 mg by mouth 4 (four) times daily.     Marland Kitchen glipiZIDE (GLUCOTROL) 10 MG tablet Take 10 mg by mouth 2 (two) times daily.    . insulin aspart (NOVOLOG) 100 UNIT/ML injection Inject into the skin as needed for high blood sugar (per sliding scale).    . insulin glargine (LANTUS) 100 UNIT/ML injection Inject 20 Units into the skin every morning.    . lidocaine (LIDODERM) 5 % Place 1 patch onto the skin daily. Remove & Discard patch within 12 hours or as directed by MD 30 patch 1  . linaclotide (LINZESS) 145 MCG CAPS capsule Take 1 capsule (145 mcg total) by mouth daily before breakfast. 30 capsule 2  . lisinopril (PRINIVIL,ZESTRIL) 5 MG tablet Take 5 mg by mouth every morning.     . metFORMIN (GLUCOPHAGE-XR) 500 MG 24 hr tablet Take 1,000 mg by mouth 2 (two) times daily.    . Multiple Vitamins-Minerals (COMPLETE MULTIVITAMIN/MINERAL PO) Take 1 tablet by mouth daily.     . ondansetron (ZOFRAN) 4 MG tablet Take 1 tablet (4 mg total) by mouth 3 (three) times daily with meals. 90 tablet 2  . oxyCODONE-acetaminophen (PERCOCET/ROXICET) 5-325 MG per tablet Take 1 tablet by mouth every 6 (six) hours as needed for severe pain.    . pantoprazole (PROTONIX)  40 MG tablet Take 1 tablet (40 mg total) by mouth 2 (two) times daily before a meal. 60 tablet 11  . promethazine (PHENERGAN) 25 MG tablet Take 0.5 tablets (12.5 mg total) by mouth every 6 (six) hours as needed for nausea or vomiting. 30 tablet 3  . ranitidine (ZANTAC) 300 MG tablet Take 300 mg by mouth at bedtime as needed for heartburn. *takes if symptoms not relieved by protonix*    . topiramate (TOPAMAX) 25 MG tablet Take 25 mg by mouth every evening.    Carlena Hurl. XARELTO 20 MG TABS tablet Take 1 tablet by mouth daily.    Marland Kitchen. linaclotide (LINZESS) 145 MCG CAPS capsule Take 1 capsule (145 mcg total) by mouth daily before breakfast. (Patient not taking: Reported on 05/12/2016) 90 capsule 3   No current  facility-administered medications for this visit.     Allergies as of 05/12/2016  . (No Known Allergies)    Family History  Problem Relation Age of Onset  . Colon cancer Neg Hx   . Liver disease Neg Hx     Social History   Social History  . Marital status: Single    Spouse name: N/A  . Number of children: N/A  . Years of education: N/A   Social History Main Topics  . Smoking status: Current Every Day Smoker    Packs/day: 1.00    Years: 20.00    Types: Cigarettes  . Smokeless tobacco: Never Used  . Alcohol use No  . Drug use: No  . Sexual activity: Yes    Birth control/ protection: Injection   Other Topics Concern  . None   Social History Narrative  . None    Review of Systems: As mentioned in HPI   Physical Exam: BP 123/78   Pulse 92   Temp 97.4 F (36.3 C) (Oral)   Ht 5\' 5"  (1.651 m)   Wt 165 lb 9.6 oz (75.1 kg)   BMI 27.56 kg/m  General:   Alert and oriented. No distress noted. Pleasant and cooperative.  Head:  Normocephalic and atraumatic. Eyes:  Conjuctiva clear without scleral icterus. Abdomen:  +BS, soft, TTP right-sided abdomen, chronic and non-distended. No rebound or guarding. No HSM or masses noted. Msk:  Symmetrical without gross deformities. Normal posture. Extremities:  Without edema. Neurologic:  Alert and  oriented x4;  grossly normal neurologically. Psych:  Alert and cooperative. Normal mood and affect.

## 2016-05-12 NOTE — Patient Instructions (Signed)
Let's try Linzess at a smaller dose. I have given you samples of Linzess 72 mcg to take once each morning, 30 minutes before breakfast.   Continue Zofran scheduled with meals and at bedtime.   Continue Protonix twice a day.  We will try and get you in with Pain Management Specialists near you or here in [place].   Return in 3 months.

## 2016-05-14 NOTE — Assessment & Plan Note (Signed)
EGD in March 2016, GES recently normal. Continue PPI. Zofran prn.

## 2016-05-14 NOTE — Assessment & Plan Note (Signed)
Decrease Linzess to 72 mcg. Return in 3 months.

## 2016-05-14 NOTE — Assessment & Plan Note (Signed)
Chronic. Query adhesive disease. Thorough work-up. Refer to pain management closer to home or here in [place].

## 2016-05-15 NOTE — Progress Notes (Signed)
cc'ed to pcp °

## 2016-05-19 DIAGNOSIS — M542 Cervicalgia: Secondary | ICD-10-CM | POA: Diagnosis not present

## 2016-05-20 ENCOUNTER — Telehealth: Payer: Self-pay | Admitting: Gastroenterology

## 2016-05-20 NOTE — Telephone Encounter (Signed)
Noted  

## 2016-05-20 NOTE — Telephone Encounter (Signed)
Dr Ronal Fearoonquah's office called to say that they do not see patients from [place] for pain management and do not accept [place] medicaid.

## 2016-05-21 NOTE — Telephone Encounter (Signed)
We don't know of anyone [place] that [place]. Please advise

## 2016-05-22 NOTE — Telephone Encounter (Signed)
She may see someone near her.

## 2016-05-25 ENCOUNTER — Telehealth: Payer: Self-pay

## 2016-05-25 NOTE — Telephone Encounter (Signed)
LMOM for pt to call office re: referral to pain management. Referral had been sent to Dr. Ronal Fearoonquah's office, however they do not accept pt's insurance.

## 2016-05-26 NOTE — Telephone Encounter (Signed)
[name] talked with her today and she is going to talk with her PCP about a pain doctor

## 2016-05-26 NOTE — Telephone Encounter (Signed)
Pt called and wanted to let AB know that the Linzess 72 mcg is working and she would like RX [facility] (stated Linzess 145 mcg was too strong). Also informed that pain management referral was sent to Dr. Ronal Fearoonquah's office but they do not accept her insurance. Pt said she has an appt with her PCP 06/12/16 and she will ask about pain management referral then, stated PCP had sent her somewhere in the past.

## 2016-05-27 MED ORDER — LINACLOTIDE 72 MCG PO CAPS: 72.0000 ug | ORAL_CAPSULE | Freq: Every day | ORAL | 3 refills | Status: DC

## 2016-05-27 NOTE — Addendum Note (Signed)
Addended by: Gelene MinkBOONE, Sher Shampine W on: 05/27/2016 09:48 AM   Modules accepted: Orders

## 2016-05-27 NOTE — Telephone Encounter (Signed)
Prescription sent. I think I had requested to see if we can refer her somewhere in [place] for pain management?

## 2016-06-09 DIAGNOSIS — E1143 Type 2 diabetes mellitus with diabetic autonomic (poly)neuropathy: Secondary | ICD-10-CM | POA: Diagnosis not present

## 2016-06-09 DIAGNOSIS — F419 Anxiety disorder, unspecified: Secondary | ICD-10-CM | POA: Diagnosis not present

## 2016-06-09 DIAGNOSIS — E1165 Type 2 diabetes mellitus with hyperglycemia: Secondary | ICD-10-CM | POA: Diagnosis not present

## 2016-06-09 DIAGNOSIS — E1140 Type 2 diabetes mellitus with diabetic neuropathy, unspecified: Secondary | ICD-10-CM | POA: Diagnosis not present

## 2016-06-09 DIAGNOSIS — I10 Essential (primary) hypertension: Secondary | ICD-10-CM | POA: Diagnosis not present

## 2016-07-06 ENCOUNTER — Encounter: Payer: Self-pay | Admitting: Gastroenterology

## 2016-08-05 DIAGNOSIS — I739 Peripheral vascular disease, unspecified: Secondary | ICD-10-CM | POA: Diagnosis not present

## 2016-08-05 DIAGNOSIS — Z9889 Other specified postprocedural states: Secondary | ICD-10-CM | POA: Diagnosis not present

## 2016-08-05 DIAGNOSIS — Z7901 Long term (current) use of anticoagulants: Secondary | ICD-10-CM | POA: Diagnosis not present

## 2016-08-05 DIAGNOSIS — Z01812 Encounter for preprocedural laboratory examination: Secondary | ICD-10-CM | POA: Diagnosis not present

## 2016-08-05 DIAGNOSIS — Z09 Encounter for follow-up examination after completed treatment for conditions other than malignant neoplasm: Secondary | ICD-10-CM | POA: Diagnosis not present

## 2016-08-05 DIAGNOSIS — Z8679 Personal history of other diseases of the circulatory system: Secondary | ICD-10-CM | POA: Diagnosis not present

## 2016-08-05 DIAGNOSIS — M79605 Pain in left leg: Secondary | ICD-10-CM | POA: Diagnosis not present

## 2016-08-12 ENCOUNTER — Ambulatory Visit: Payer: Medicare Other | Admitting: Gastroenterology

## 2016-08-13 ENCOUNTER — Ambulatory Visit: Payer: Medicare Other | Admitting: Gastroenterology

## 2016-08-19 ENCOUNTER — Encounter: Payer: Self-pay | Admitting: Gastroenterology

## 2016-08-19 ENCOUNTER — Ambulatory Visit (INDEPENDENT_AMBULATORY_CARE_PROVIDER_SITE_OTHER): Payer: Medicare Other | Admitting: Gastroenterology

## 2016-08-19 VITALS — BP 144/87 | HR 109 | Temp 98.5°F | Ht 64.0 in | Wt 177.0 lb

## 2016-08-19 DIAGNOSIS — R1011 Right upper quadrant pain: Secondary | ICD-10-CM

## 2016-08-19 DIAGNOSIS — K5900 Constipation, unspecified: Secondary | ICD-10-CM | POA: Diagnosis not present

## 2016-08-19 NOTE — Patient Instructions (Signed)
Continue taking Linzess 72 mcg each morning as you are doing.  I am sorry you are having such pain with your leg!  We will see you in 6 months!

## 2016-08-19 NOTE — Assessment & Plan Note (Signed)
Chronic, question of adhesive disease. Pain Management upcoming Feb 2018. Linzess 72 mcg working quite well. Return in 6 months.

## 2016-08-19 NOTE — Progress Notes (Signed)
Referring Provider: Selinda Flavin, MD Primary Care Physician:  Selinda Flavin, MD  Primary GI: Dr. Darrick Penna   Chief Complaint  Patient presents with  . Abdominal Pain    HPI:   [name] is a 46 y.o. female presenting today with a history of chronic abdominal pain, constipation, dysphagia, N/V. GES normal last year. Last EGD in March 2016 with empiric dilation.    Left leg very painful due to advanced peripheral arterial occlusive disease, hx of tobacco abuse, hx of failed bypass. Will be assessed for candidacy of revascularization in near future.  Still with chronic right-sided pain but bowel movements are better. Linzess 72 mcg works well.    Has an appt with Pain Management (Dr. Reynolds Bowl with [facility]).   Continues to work on purposeful weight loss and doing quite well with this.   Past Medical History:  Diagnosis Date  . Anemia 02/05/2012  . Anginal pain (HCC)   . Anxiety   . Arthritis   . Asthma   . COPD (chronic obstructive pulmonary disease) (HCC)   . Depression   . Diabetes mellitus (HCC) 01/28/2012  . GERD (gastroesophageal reflux disease)   . Headache(784.0)   . Hypertension   . PE (pulmonary embolism)    Xarelto  . Peripheral vascular disease (HCC)   . Shortness of breath   . Tuberculosis     Past Surgical History:  Procedure Laterality Date  . APPENDECTOMY    . CHOLECYSTECTOMY    . DIAGNOSTIC LAPAROSCOPY    . ESOPHAGEAL DILATION N/A 10/09/2014   Procedure: ESOPHAGEAL DILATION;  Surgeon: West Bali, MD;  Location: [facility];  Service: Endoscopy;  Laterality: N/A;  #15 and #16 savory  . ESOPHAGOGASTRODUODENOSCOPY (EGD) WITH PROPOFOL N/A 10/09/2014   Dr. Darrick Penna: mild erosive gastritis, Savary dilation. Nausea and vomiting most likely due to uncontroleld blood sugars, reflux, and gastritis.     Current Outpatient Prescriptions  Medication Sig Dispense Refill  . ALPRAZolam (XANAX) 0.5 MG tablet Take 0.5 mg by mouth 2 (two) times daily as needed for  anxiety.    . Calcium Carbonate-Vitamin D (CALCIUM 600 + D PO) Take 1 tablet by mouth daily.     . carvedilol (COREG) 3.125 MG tablet Take 3.125 mg by mouth 2 (two) times daily with a meal.    . gabapentin (NEURONTIN) 600 MG tablet Take 600 mg by mouth 4 (four) times daily.     Marland Kitchen glipiZIDE (GLUCOTROL) 10 MG tablet Take 10 mg by mouth 2 (two) times daily.    . insulin aspart (NOVOLOG) 100 UNIT/ML injection Inject into the skin as needed for high blood sugar (per sliding scale).    . insulin glargine (LANTUS) 100 UNIT/ML injection Inject 20 Units into the skin every morning.    . lidocaine (LIDODERM) 5 % Place 1 patch onto the skin daily. Remove & Discard patch within 12 hours or as directed by MD 30 patch 1  . linaclotide (LINZESS) 72 MCG capsule Take 1 capsule (72 mcg total) by mouth daily before breakfast. 90 capsule 3  . lisinopril (PRINIVIL,ZESTRIL) 5 MG tablet Take 5 mg by mouth every morning.     . metFORMIN (GLUCOPHAGE-XR) 500 MG 24 hr tablet Take 1,000 mg by mouth 2 (two) times daily.    . Multiple Vitamins-Minerals (COMPLETE MULTIVITAMIN/MINERAL PO) Take 1 tablet by mouth daily.     . ondansetron (ZOFRAN) 4 MG tablet Take 1 tablet (4 mg total) by mouth 3 (three) times daily with meals.  90 tablet 2  . pantoprazole (PROTONIX) 40 MG tablet Take 1 tablet (40 mg total) by mouth 2 (two) times daily before a meal. 60 tablet 11  . promethazine (PHENERGAN) 25 MG tablet Take 0.5 tablets (12.5 mg total) by mouth every 6 (six) hours as needed for nausea or vomiting. 30 tablet 3  . ranitidine (ZANTAC) 300 MG tablet Take 300 mg by mouth at bedtime as needed for heartburn. *takes if symptoms not relieved by protonix*    . topiramate (TOPAMAX) 25 MG tablet Take 25 mg by mouth every evening.    Carlena Hurl. XARELTO 20 MG TABS tablet Take 1 tablet by mouth daily.    Marland Kitchen. oxyCODONE-acetaminophen (PERCOCET/ROXICET) 5-325 MG per tablet Take 1 tablet by mouth every 6 (six) hours as needed for severe pain.     No current  facility-administered medications for this visit.     Allergies as of 08/19/2016  . (No Known Allergies)    Family History  Problem Relation Age of Onset  . Colon cancer Neg Hx   . Liver disease Neg Hx     Social History   Social History  . Marital status: Single    Spouse name: N/A  . Number of children: N/A  . Years of education: N/A   Social History Main Topics  . Smoking status: Current Every Day Smoker    Packs/day: 1.00    Years: 20.00    Types: Cigarettes  . Smokeless tobacco: Never Used  . Alcohol use No  . Drug use: No  . Sexual activity: Yes    Birth control/ protection: Injection   Other Topics Concern  . None   Social History Narrative  . None    Review of Systems: As mentioned in HPI   Physical Exam: BP (!) 144/87   Pulse (!) 109   Temp 98.5 F (36.9 C) (Oral)   Ht 5\' 4"  (1.626 m)   Wt 177 lb (80.3 kg)   BMI 30.38 kg/m  General:   Alert and oriented. No distress noted. Pleasant and cooperative.  Head:  Normocephalic and atraumatic. Eyes:  Conjuctiva clear without scleral icterus. Mouth:  Oral mucosa pink and moist. Good dentition. No lesions. Abdomen:  +BS, soft, mild chronic TTP RUQ and non-distended. No rebound or guarding. No HSM or masses noted. Msk:  Symmetrical without gross deformities. Normal posture. Extremities:  Without edema. Neurologic:  Alert and  oriented x4;  grossly normal neurologically. Psych:  Alert and cooperative. Normal mood and affect.

## 2016-08-20 DIAGNOSIS — I70213 Atherosclerosis of native arteries of extremities with intermittent claudication, bilateral legs: Secondary | ICD-10-CM | POA: Diagnosis not present

## 2016-08-20 DIAGNOSIS — I743 Embolism and thrombosis of arteries of the lower extremities: Secondary | ICD-10-CM | POA: Diagnosis not present

## 2016-08-20 DIAGNOSIS — I739 Peripheral vascular disease, unspecified: Secondary | ICD-10-CM | POA: Diagnosis not present

## 2016-08-20 DIAGNOSIS — M79605 Pain in left leg: Secondary | ICD-10-CM | POA: Diagnosis not present

## 2016-08-20 DIAGNOSIS — Z01810 Encounter for preprocedural cardiovascular examination: Secondary | ICD-10-CM | POA: Diagnosis not present

## 2016-08-20 DIAGNOSIS — Z87891 Personal history of nicotine dependence: Secondary | ICD-10-CM | POA: Diagnosis not present

## 2016-08-20 DIAGNOSIS — R262 Difficulty in walking, not elsewhere classified: Secondary | ICD-10-CM | POA: Diagnosis not present

## 2016-08-20 NOTE — Progress Notes (Signed)
CC'ED TO PCP 

## 2016-08-24 DIAGNOSIS — R944 Abnormal results of kidney function studies: Secondary | ICD-10-CM | POA: Diagnosis not present

## 2016-08-26 DIAGNOSIS — I779 Disorder of arteries and arterioles, unspecified: Secondary | ICD-10-CM | POA: Diagnosis not present

## 2016-08-28 DIAGNOSIS — R1031 Right lower quadrant pain: Secondary | ICD-10-CM | POA: Diagnosis not present

## 2016-08-28 DIAGNOSIS — M79605 Pain in left leg: Secondary | ICD-10-CM | POA: Diagnosis not present

## 2016-08-28 DIAGNOSIS — M542 Cervicalgia: Secondary | ICD-10-CM | POA: Diagnosis not present

## 2016-08-28 DIAGNOSIS — I739 Peripheral vascular disease, unspecified: Secondary | ICD-10-CM | POA: Diagnosis not present

## 2016-08-28 DIAGNOSIS — G8929 Other chronic pain: Secondary | ICD-10-CM | POA: Diagnosis not present

## 2016-09-11 DIAGNOSIS — Z794 Long term (current) use of insulin: Secondary | ICD-10-CM | POA: Diagnosis not present

## 2016-09-11 DIAGNOSIS — Z86711 Personal history of pulmonary embolism: Secondary | ICD-10-CM | POA: Diagnosis not present

## 2016-09-11 DIAGNOSIS — E119 Type 2 diabetes mellitus without complications: Secondary | ICD-10-CM | POA: Diagnosis not present

## 2016-09-11 DIAGNOSIS — L0291 Cutaneous abscess, unspecified: Secondary | ICD-10-CM | POA: Diagnosis not present

## 2016-09-11 DIAGNOSIS — K610 Anal abscess: Secondary | ICD-10-CM | POA: Diagnosis not present

## 2016-09-11 DIAGNOSIS — I10 Essential (primary) hypertension: Secondary | ICD-10-CM | POA: Diagnosis not present

## 2016-09-11 DIAGNOSIS — I739 Peripheral vascular disease, unspecified: Secondary | ICD-10-CM | POA: Diagnosis not present

## 2016-09-11 DIAGNOSIS — L0231 Cutaneous abscess of buttock: Secondary | ICD-10-CM | POA: Diagnosis not present

## 2016-09-11 DIAGNOSIS — K611 Rectal abscess: Secondary | ICD-10-CM | POA: Diagnosis not present

## 2016-09-11 DIAGNOSIS — J449 Chronic obstructive pulmonary disease, unspecified: Secondary | ICD-10-CM | POA: Diagnosis not present

## 2016-09-11 DIAGNOSIS — K219 Gastro-esophageal reflux disease without esophagitis: Secondary | ICD-10-CM | POA: Diagnosis not present

## 2016-09-11 DIAGNOSIS — Z6828 Body mass index (BMI) 28.0-28.9, adult: Secondary | ICD-10-CM | POA: Diagnosis not present

## 2016-09-11 DIAGNOSIS — Z7901 Long term (current) use of anticoagulants: Secondary | ICD-10-CM | POA: Diagnosis not present

## 2016-09-11 DIAGNOSIS — J852 Abscess of lung without pneumonia: Secondary | ICD-10-CM | POA: Diagnosis not present

## 2016-09-12 DIAGNOSIS — J449 Chronic obstructive pulmonary disease, unspecified: Secondary | ICD-10-CM | POA: Diagnosis not present

## 2016-09-12 DIAGNOSIS — E119 Type 2 diabetes mellitus without complications: Secondary | ICD-10-CM | POA: Diagnosis not present

## 2016-09-12 DIAGNOSIS — I10 Essential (primary) hypertension: Secondary | ICD-10-CM | POA: Diagnosis not present

## 2016-09-12 DIAGNOSIS — K611 Rectal abscess: Secondary | ICD-10-CM | POA: Diagnosis not present

## 2016-09-24 DIAGNOSIS — R011 Cardiac murmur, unspecified: Secondary | ICD-10-CM | POA: Diagnosis not present

## 2016-09-24 DIAGNOSIS — I739 Peripheral vascular disease, unspecified: Secondary | ICD-10-CM | POA: Diagnosis not present

## 2016-09-24 DIAGNOSIS — E785 Hyperlipidemia, unspecified: Secondary | ICD-10-CM | POA: Diagnosis not present

## 2016-09-24 DIAGNOSIS — I10 Essential (primary) hypertension: Secondary | ICD-10-CM | POA: Diagnosis not present

## 2016-09-24 DIAGNOSIS — Z720 Tobacco use: Secondary | ICD-10-CM | POA: Diagnosis not present

## 2016-10-08 DIAGNOSIS — F419 Anxiety disorder, unspecified: Secondary | ICD-10-CM | POA: Diagnosis not present

## 2016-10-08 DIAGNOSIS — F17200 Nicotine dependence, unspecified, uncomplicated: Secondary | ICD-10-CM | POA: Diagnosis not present

## 2016-10-08 DIAGNOSIS — Z6828 Body mass index (BMI) 28.0-28.9, adult: Secondary | ICD-10-CM | POA: Diagnosis not present

## 2016-10-08 DIAGNOSIS — I10 Essential (primary) hypertension: Secondary | ICD-10-CM | POA: Diagnosis not present

## 2016-10-08 DIAGNOSIS — M47812 Spondylosis without myelopathy or radiculopathy, cervical region: Secondary | ICD-10-CM | POA: Diagnosis not present

## 2016-10-08 DIAGNOSIS — E1140 Type 2 diabetes mellitus with diabetic neuropathy, unspecified: Secondary | ICD-10-CM | POA: Diagnosis not present

## 2016-10-22 DIAGNOSIS — R011 Cardiac murmur, unspecified: Secondary | ICD-10-CM | POA: Diagnosis not present

## 2016-10-30 DIAGNOSIS — Z01818 Encounter for other preprocedural examination: Secondary | ICD-10-CM | POA: Diagnosis not present

## 2016-10-30 DIAGNOSIS — I10 Essential (primary) hypertension: Secondary | ICD-10-CM | POA: Diagnosis not present

## 2016-11-09 DIAGNOSIS — R599 Enlarged lymph nodes, unspecified: Secondary | ICD-10-CM | POA: Diagnosis not present

## 2016-11-09 DIAGNOSIS — I952 Hypotension due to drugs: Secondary | ICD-10-CM | POA: Diagnosis not present

## 2016-11-09 DIAGNOSIS — K219 Gastro-esophageal reflux disease without esophagitis: Secondary | ICD-10-CM | POA: Diagnosis present

## 2016-11-09 DIAGNOSIS — I70222 Atherosclerosis of native arteries of extremities with rest pain, left leg: Secondary | ICD-10-CM | POA: Diagnosis not present

## 2016-11-09 DIAGNOSIS — I779 Disorder of arteries and arterioles, unspecified: Secondary | ICD-10-CM | POA: Diagnosis not present

## 2016-11-09 DIAGNOSIS — Z86711 Personal history of pulmonary embolism: Secondary | ICD-10-CM | POA: Diagnosis not present

## 2016-11-09 DIAGNOSIS — E1151 Type 2 diabetes mellitus with diabetic peripheral angiopathy without gangrene: Secondary | ICD-10-CM | POA: Diagnosis present

## 2016-11-09 DIAGNOSIS — J449 Chronic obstructive pulmonary disease, unspecified: Secondary | ICD-10-CM | POA: Diagnosis present

## 2016-11-09 DIAGNOSIS — Z794 Long term (current) use of insulin: Secondary | ICD-10-CM | POA: Diagnosis not present

## 2016-11-09 DIAGNOSIS — I10 Essential (primary) hypertension: Secondary | ICD-10-CM | POA: Diagnosis present

## 2016-11-09 DIAGNOSIS — T402X5A Adverse effect of other opioids, initial encounter: Secondary | ICD-10-CM | POA: Diagnosis not present

## 2016-11-09 DIAGNOSIS — E1165 Type 2 diabetes mellitus with hyperglycemia: Secondary | ICD-10-CM | POA: Diagnosis not present

## 2016-11-09 DIAGNOSIS — E1140 Type 2 diabetes mellitus with diabetic neuropathy, unspecified: Secondary | ICD-10-CM | POA: Diagnosis present

## 2016-11-09 DIAGNOSIS — E7800 Pure hypercholesterolemia, unspecified: Secondary | ICD-10-CM | POA: Diagnosis present

## 2016-11-09 DIAGNOSIS — I70212 Atherosclerosis of native arteries of extremities with intermittent claudication, left leg: Secondary | ICD-10-CM | POA: Diagnosis not present

## 2016-11-09 DIAGNOSIS — I2510 Atherosclerotic heart disease of native coronary artery without angina pectoris: Secondary | ICD-10-CM | POA: Diagnosis present

## 2016-11-09 DIAGNOSIS — F17210 Nicotine dependence, cigarettes, uncomplicated: Secondary | ICD-10-CM | POA: Diagnosis present

## 2016-12-10 DIAGNOSIS — L0390 Cellulitis, unspecified: Secondary | ICD-10-CM | POA: Diagnosis not present

## 2016-12-10 DIAGNOSIS — Z6829 Body mass index (BMI) 29.0-29.9, adult: Secondary | ICD-10-CM | POA: Diagnosis not present

## 2016-12-14 DIAGNOSIS — K611 Rectal abscess: Secondary | ICD-10-CM | POA: Diagnosis not present

## 2016-12-16 IMAGING — DX DG ABDOMEN 1V
2 series · 2 of 2 positions shown · non-contrast
Comparison: CT scan of the abdomen pelvis of December 06, 2015

CLINICAL DATA: Generalized abdominal pain and bloating,
constipation, and nausea and vomiting intermittently for the past
3-4 months. No bowel movement for the past 5 days.

EXAM:
ABDOMEN - 1 VIEW

[abdomen kub (1 of 2)]
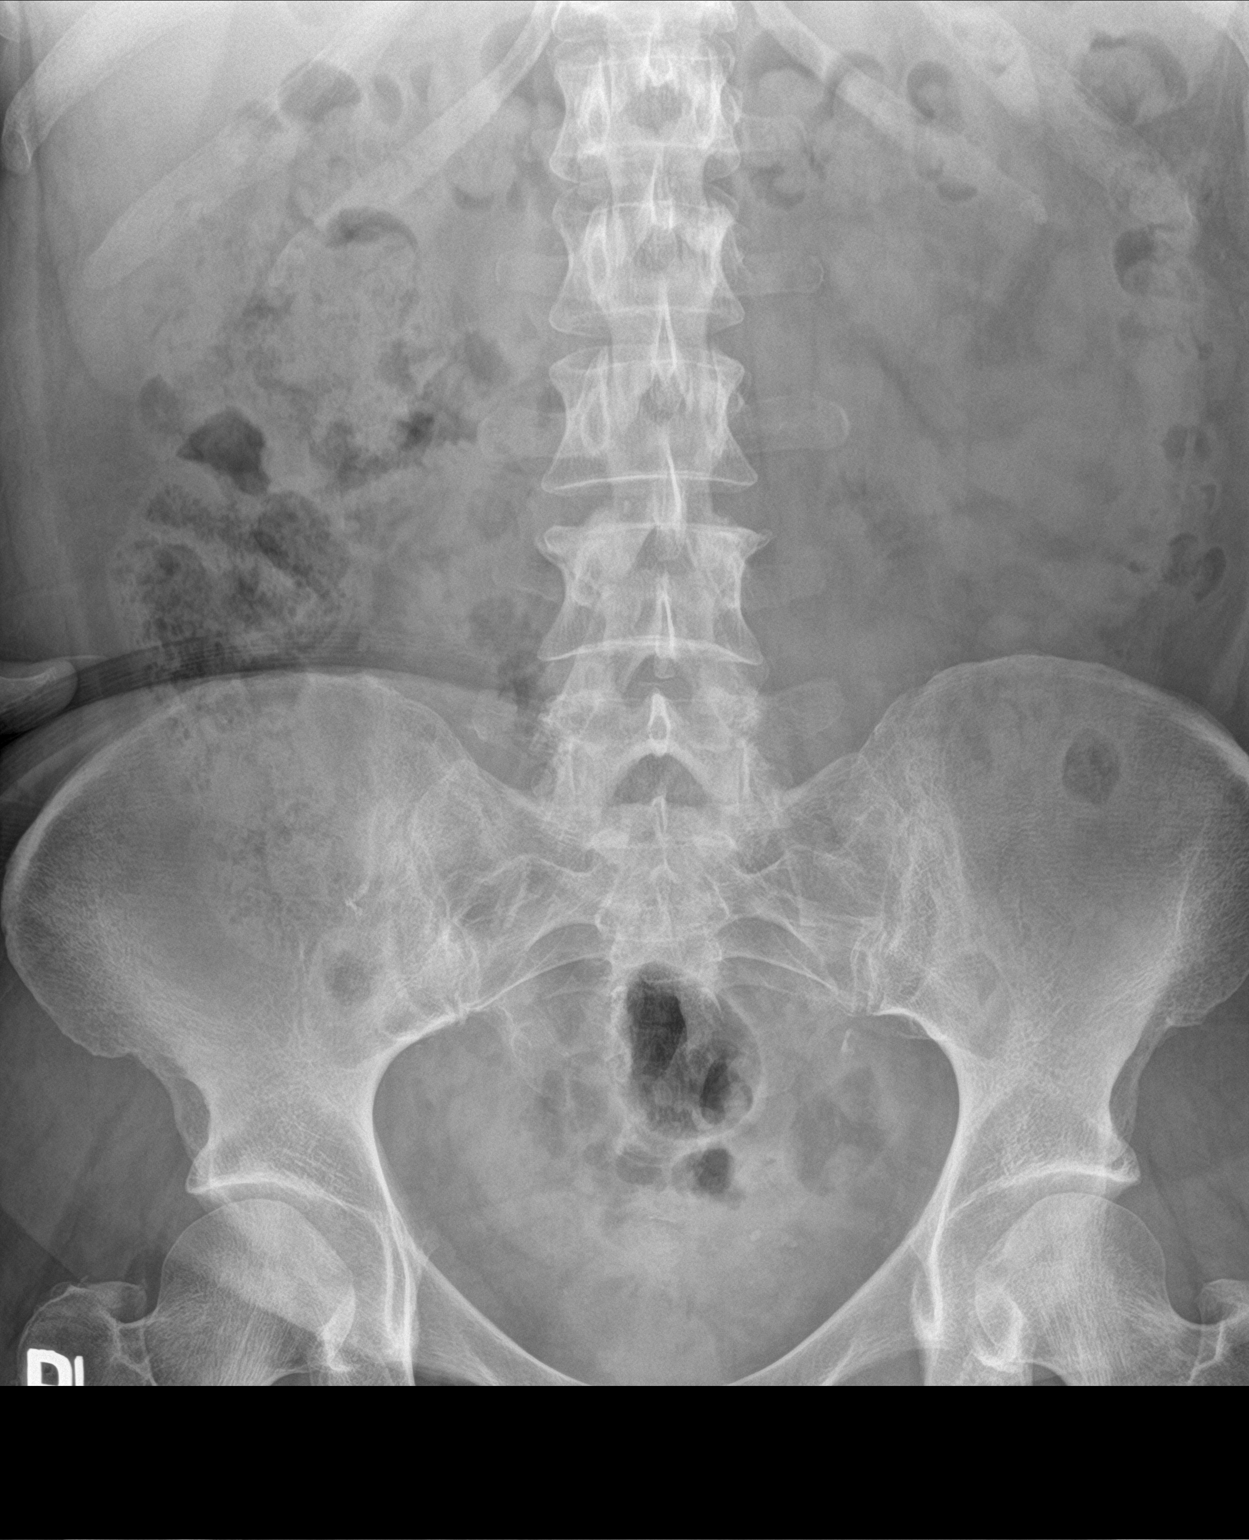

[abdomen kub (2 of 2)]
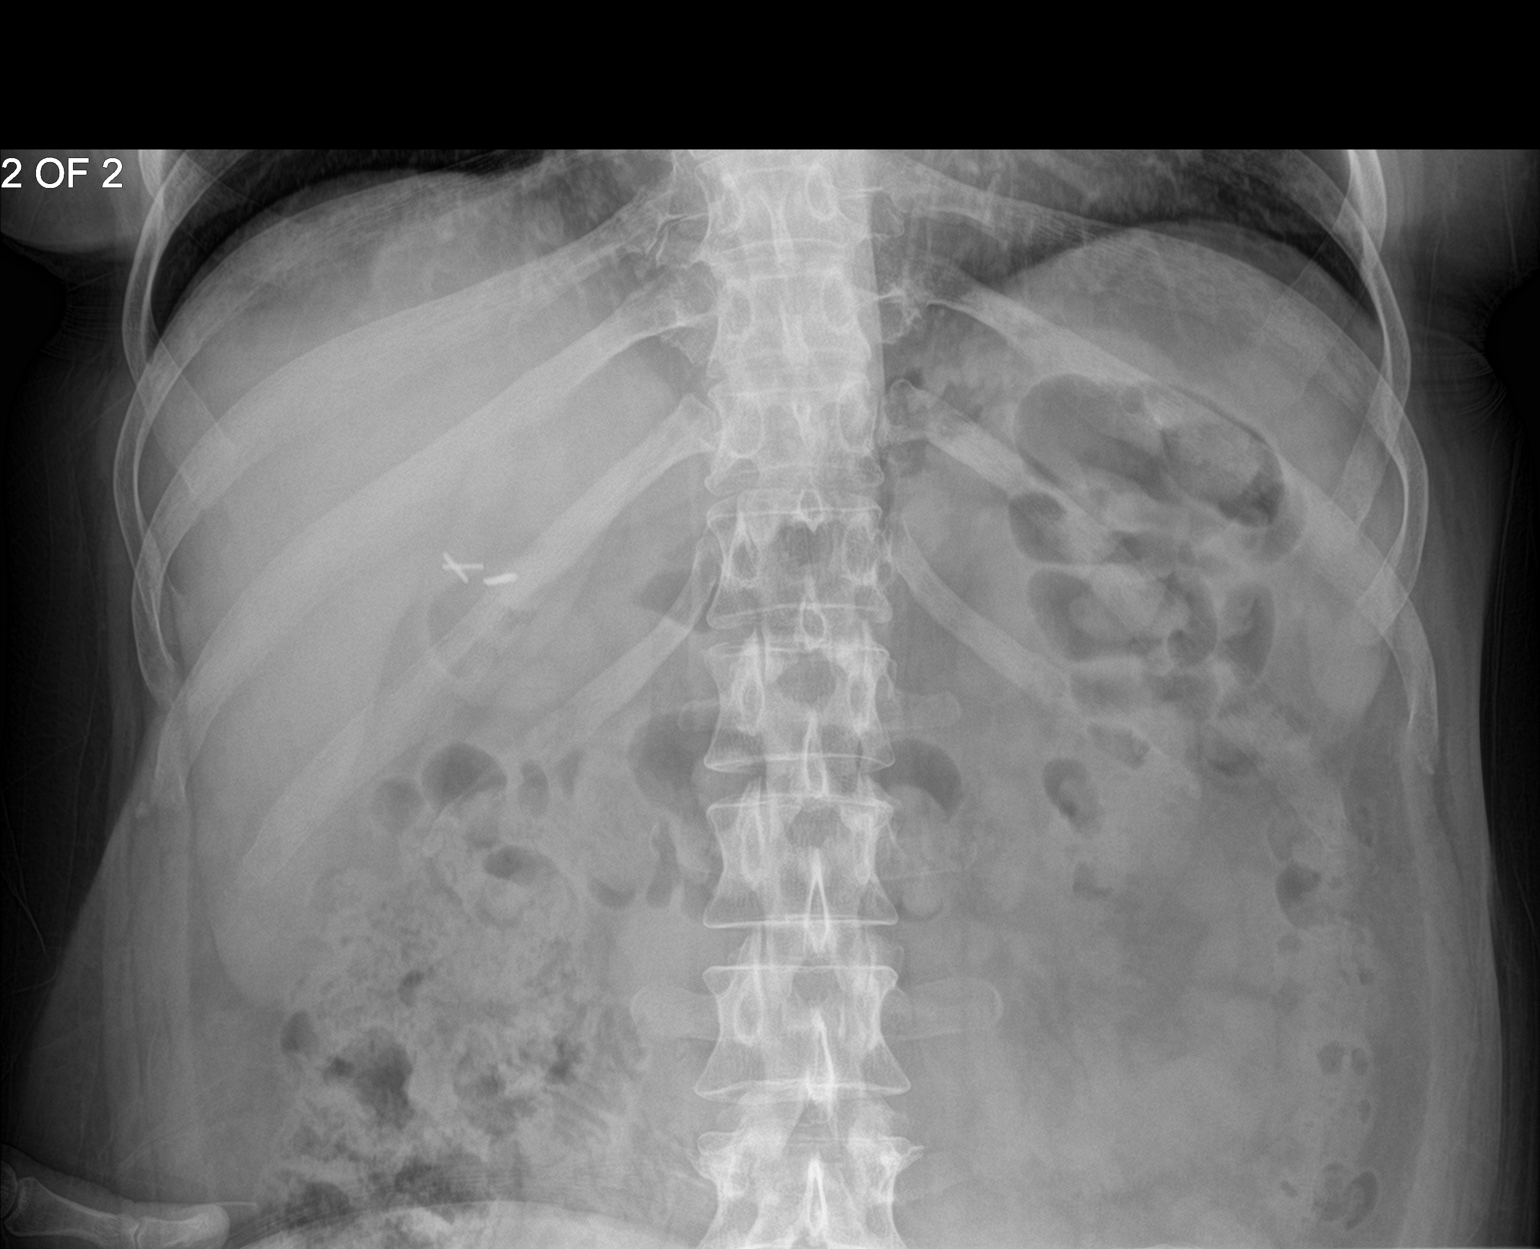

[2 of 2 positions shown; findings below may reference images not displayed]

FINDINGS: The colonic stool burden is increased diffusely. There is no
evidence of a fecal impaction. No abnormal small bowel gas
collections are observed. No ureteral or bladder stones are
demonstrated. There are pelvic phleboliths and there is
calcification within the common iliac arteries. There are surgical
clips in the gallbladder fossa. The lung bases are clear. The bony
structures exhibit no acute abnormalities. There is mild
degenerative disc disease centered at L4-5.
IMPRESSION: 1. Increase colonic stool burden consistent with clinical
constipation. No small or large bowel obstructive pattern.
2. Iliac artery atherosclerosis.

## 2016-12-25 DIAGNOSIS — M542 Cervicalgia: Secondary | ICD-10-CM | POA: Diagnosis not present

## 2016-12-25 DIAGNOSIS — R1084 Generalized abdominal pain: Secondary | ICD-10-CM | POA: Diagnosis not present

## 2016-12-25 DIAGNOSIS — M79605 Pain in left leg: Secondary | ICD-10-CM | POA: Diagnosis not present

## 2016-12-25 DIAGNOSIS — G8929 Other chronic pain: Secondary | ICD-10-CM | POA: Diagnosis not present

## 2017-01-04 DIAGNOSIS — K611 Rectal abscess: Secondary | ICD-10-CM | POA: Diagnosis not present

## 2017-02-15 DIAGNOSIS — K611 Rectal abscess: Secondary | ICD-10-CM | POA: Diagnosis not present

## 2017-02-17 ENCOUNTER — Ambulatory Visit: Payer: Medicare Other | Admitting: Nurse Practitioner

## 2017-02-26 DIAGNOSIS — I739 Peripheral vascular disease, unspecified: Secondary | ICD-10-CM | POA: Diagnosis not present

## 2017-02-26 DIAGNOSIS — M791 Myalgia: Secondary | ICD-10-CM | POA: Diagnosis not present

## 2017-02-26 DIAGNOSIS — G8929 Other chronic pain: Secondary | ICD-10-CM | POA: Diagnosis not present

## 2017-02-26 DIAGNOSIS — M542 Cervicalgia: Secondary | ICD-10-CM | POA: Diagnosis not present

## 2017-03-01 DIAGNOSIS — K611 Rectal abscess: Secondary | ICD-10-CM | POA: Diagnosis not present

## 2017-04-05 DIAGNOSIS — E1165 Type 2 diabetes mellitus with hyperglycemia: Secondary | ICD-10-CM | POA: Diagnosis not present

## 2017-04-05 DIAGNOSIS — I10 Essential (primary) hypertension: Secondary | ICD-10-CM | POA: Diagnosis not present

## 2017-04-05 DIAGNOSIS — K219 Gastro-esophageal reflux disease without esophagitis: Secondary | ICD-10-CM | POA: Diagnosis not present

## 2017-04-07 DIAGNOSIS — Z6830 Body mass index (BMI) 30.0-30.9, adult: Secondary | ICD-10-CM | POA: Diagnosis not present

## 2017-04-07 DIAGNOSIS — I10 Essential (primary) hypertension: Secondary | ICD-10-CM | POA: Diagnosis not present

## 2017-04-07 DIAGNOSIS — F17200 Nicotine dependence, unspecified, uncomplicated: Secondary | ICD-10-CM | POA: Diagnosis not present

## 2017-04-07 DIAGNOSIS — F419 Anxiety disorder, unspecified: Secondary | ICD-10-CM | POA: Diagnosis not present

## 2017-04-07 DIAGNOSIS — M47812 Spondylosis without myelopathy or radiculopathy, cervical region: Secondary | ICD-10-CM | POA: Diagnosis not present

## 2017-04-07 DIAGNOSIS — E1140 Type 2 diabetes mellitus with diabetic neuropathy, unspecified: Secondary | ICD-10-CM | POA: Diagnosis not present

## 2017-05-05 DIAGNOSIS — M79605 Pain in left leg: Secondary | ICD-10-CM | POA: Diagnosis not present

## 2017-05-05 DIAGNOSIS — Z95820 Peripheral vascular angioplasty status with implants and grafts: Secondary | ICD-10-CM | POA: Diagnosis not present

## 2017-05-05 DIAGNOSIS — Z95828 Presence of other vascular implants and grafts: Secondary | ICD-10-CM | POA: Diagnosis not present

## 2017-05-05 DIAGNOSIS — M79604 Pain in right leg: Secondary | ICD-10-CM | POA: Diagnosis not present

## 2017-05-05 DIAGNOSIS — I739 Peripheral vascular disease, unspecified: Secondary | ICD-10-CM | POA: Diagnosis not present

## 2017-05-05 DIAGNOSIS — Z7901 Long term (current) use of anticoagulants: Secondary | ICD-10-CM | POA: Diagnosis not present

## 2017-05-05 DIAGNOSIS — Z01812 Encounter for preprocedural laboratory examination: Secondary | ICD-10-CM | POA: Diagnosis not present

## 2017-05-20 DIAGNOSIS — I739 Peripheral vascular disease, unspecified: Secondary | ICD-10-CM | POA: Diagnosis not present

## 2017-05-20 DIAGNOSIS — I70213 Atherosclerosis of native arteries of extremities with intermittent claudication, bilateral legs: Secondary | ICD-10-CM | POA: Diagnosis not present

## 2017-05-20 DIAGNOSIS — Z01818 Encounter for other preprocedural examination: Secondary | ICD-10-CM | POA: Diagnosis not present

## 2017-05-26 DIAGNOSIS — I739 Peripheral vascular disease, unspecified: Secondary | ICD-10-CM | POA: Diagnosis not present

## 2017-05-26 DIAGNOSIS — I10 Essential (primary) hypertension: Secondary | ICD-10-CM | POA: Diagnosis not present

## 2017-05-26 DIAGNOSIS — Z01810 Encounter for preprocedural cardiovascular examination: Secondary | ICD-10-CM | POA: Diagnosis not present

## 2017-05-26 DIAGNOSIS — R262 Difficulty in walking, not elsewhere classified: Secondary | ICD-10-CM | POA: Diagnosis not present

## 2017-05-26 DIAGNOSIS — Z01812 Encounter for preprocedural laboratory examination: Secondary | ICD-10-CM | POA: Diagnosis not present

## 2017-05-26 DIAGNOSIS — M79604 Pain in right leg: Secondary | ICD-10-CM | POA: Diagnosis not present

## 2017-06-01 DIAGNOSIS — Z6831 Body mass index (BMI) 31.0-31.9, adult: Secondary | ICD-10-CM | POA: Diagnosis not present

## 2017-06-01 DIAGNOSIS — Z86711 Personal history of pulmonary embolism: Secondary | ICD-10-CM | POA: Diagnosis not present

## 2017-06-01 DIAGNOSIS — K219 Gastro-esophageal reflux disease without esophagitis: Secondary | ICD-10-CM | POA: Diagnosis present

## 2017-06-01 DIAGNOSIS — E669 Obesity, unspecified: Secondary | ICD-10-CM | POA: Diagnosis present

## 2017-06-01 DIAGNOSIS — F329 Major depressive disorder, single episode, unspecified: Secondary | ICD-10-CM | POA: Diagnosis present

## 2017-06-01 DIAGNOSIS — I70211 Atherosclerosis of native arteries of extremities with intermittent claudication, right leg: Secondary | ICD-10-CM | POA: Diagnosis present

## 2017-06-01 DIAGNOSIS — I10 Essential (primary) hypertension: Secondary | ICD-10-CM | POA: Diagnosis present

## 2017-06-01 DIAGNOSIS — F419 Anxiety disorder, unspecified: Secondary | ICD-10-CM | POA: Diagnosis present

## 2017-06-01 DIAGNOSIS — Z7982 Long term (current) use of aspirin: Secondary | ICD-10-CM | POA: Diagnosis not present

## 2017-06-01 DIAGNOSIS — I739 Peripheral vascular disease, unspecified: Secondary | ICD-10-CM | POA: Diagnosis not present

## 2017-06-01 DIAGNOSIS — M7741 Metatarsalgia, right foot: Secondary | ICD-10-CM | POA: Diagnosis present

## 2017-06-01 DIAGNOSIS — E1142 Type 2 diabetes mellitus with diabetic polyneuropathy: Secondary | ICD-10-CM | POA: Diagnosis present

## 2017-06-01 DIAGNOSIS — I959 Hypotension, unspecified: Secondary | ICD-10-CM | POA: Diagnosis present

## 2017-06-01 DIAGNOSIS — Z23 Encounter for immunization: Secondary | ICD-10-CM | POA: Diagnosis not present

## 2017-06-01 DIAGNOSIS — J449 Chronic obstructive pulmonary disease, unspecified: Secondary | ICD-10-CM | POA: Diagnosis present

## 2017-06-01 DIAGNOSIS — E1151 Type 2 diabetes mellitus with diabetic peripheral angiopathy without gangrene: Secondary | ICD-10-CM | POA: Diagnosis present

## 2017-06-01 DIAGNOSIS — Z8249 Family history of ischemic heart disease and other diseases of the circulatory system: Secondary | ICD-10-CM | POA: Diagnosis not present

## 2017-06-01 DIAGNOSIS — E785 Hyperlipidemia, unspecified: Secondary | ICD-10-CM | POA: Diagnosis present

## 2017-06-01 DIAGNOSIS — E7800 Pure hypercholesterolemia, unspecified: Secondary | ICD-10-CM | POA: Diagnosis present

## 2017-06-01 DIAGNOSIS — F17210 Nicotine dependence, cigarettes, uncomplicated: Secondary | ICD-10-CM | POA: Diagnosis present

## 2017-06-12 DIAGNOSIS — M549 Dorsalgia, unspecified: Secondary | ICD-10-CM | POA: Diagnosis not present

## 2017-09-22 DIAGNOSIS — I739 Peripheral vascular disease, unspecified: Secondary | ICD-10-CM | POA: Diagnosis not present

## 2017-10-05 DIAGNOSIS — I739 Peripheral vascular disease, unspecified: Secondary | ICD-10-CM | POA: Diagnosis not present

## 2017-10-05 DIAGNOSIS — Z95820 Peripheral vascular angioplasty status with implants and grafts: Secondary | ICD-10-CM | POA: Diagnosis not present

## 2017-10-13 DIAGNOSIS — I739 Peripheral vascular disease, unspecified: Secondary | ICD-10-CM | POA: Diagnosis not present

## 2017-11-04 DIAGNOSIS — I739 Peripheral vascular disease, unspecified: Secondary | ICD-10-CM | POA: Diagnosis not present

## 2017-11-04 DIAGNOSIS — E1140 Type 2 diabetes mellitus with diabetic neuropathy, unspecified: Secondary | ICD-10-CM | POA: Diagnosis not present

## 2017-11-04 DIAGNOSIS — F419 Anxiety disorder, unspecified: Secondary | ICD-10-CM | POA: Diagnosis not present

## 2017-11-04 DIAGNOSIS — F17200 Nicotine dependence, unspecified, uncomplicated: Secondary | ICD-10-CM | POA: Diagnosis not present

## 2017-11-04 DIAGNOSIS — Z6831 Body mass index (BMI) 31.0-31.9, adult: Secondary | ICD-10-CM | POA: Diagnosis not present

## 2017-11-04 DIAGNOSIS — M129 Arthropathy, unspecified: Secondary | ICD-10-CM | POA: Diagnosis not present

## 2018-01-06 DIAGNOSIS — E119 Type 2 diabetes mellitus without complications: Secondary | ICD-10-CM | POA: Diagnosis not present

## 2018-02-03 DIAGNOSIS — E1165 Type 2 diabetes mellitus with hyperglycemia: Secondary | ICD-10-CM | POA: Diagnosis not present

## 2018-02-03 DIAGNOSIS — Z720 Tobacco use: Secondary | ICD-10-CM | POA: Diagnosis not present

## 2018-02-03 DIAGNOSIS — Z0001 Encounter for general adult medical examination with abnormal findings: Secondary | ICD-10-CM | POA: Diagnosis not present

## 2018-02-03 DIAGNOSIS — I10 Essential (primary) hypertension: Secondary | ICD-10-CM | POA: Diagnosis not present

## 2018-02-03 DIAGNOSIS — E1140 Type 2 diabetes mellitus with diabetic neuropathy, unspecified: Secondary | ICD-10-CM | POA: Diagnosis not present

## 2018-02-03 DIAGNOSIS — K219 Gastro-esophageal reflux disease without esophagitis: Secondary | ICD-10-CM | POA: Diagnosis not present

## 2018-02-03 DIAGNOSIS — Z6829 Body mass index (BMI) 29.0-29.9, adult: Secondary | ICD-10-CM | POA: Diagnosis not present

## 2018-02-03 DIAGNOSIS — F419 Anxiety disorder, unspecified: Secondary | ICD-10-CM | POA: Diagnosis not present

## 2018-02-03 DIAGNOSIS — E1143 Type 2 diabetes mellitus with diabetic autonomic (poly)neuropathy: Secondary | ICD-10-CM | POA: Diagnosis not present

## 2018-04-13 DIAGNOSIS — Z95820 Peripheral vascular angioplasty status with implants and grafts: Secondary | ICD-10-CM | POA: Diagnosis not present

## 2018-04-13 DIAGNOSIS — I779 Disorder of arteries and arterioles, unspecified: Secondary | ICD-10-CM | POA: Diagnosis not present

## 2018-04-13 DIAGNOSIS — Z95828 Presence of other vascular implants and grafts: Secondary | ICD-10-CM | POA: Diagnosis not present

## 2018-04-13 DIAGNOSIS — I739 Peripheral vascular disease, unspecified: Secondary | ICD-10-CM | POA: Diagnosis not present

## 2018-07-07 DIAGNOSIS — H40053 Ocular hypertension, bilateral: Secondary | ICD-10-CM | POA: Diagnosis not present

## 2018-07-07 DIAGNOSIS — H35033 Hypertensive retinopathy, bilateral: Secondary | ICD-10-CM | POA: Diagnosis not present

## 2018-08-04 DIAGNOSIS — M47812 Spondylosis without myelopathy or radiculopathy, cervical region: Secondary | ICD-10-CM | POA: Diagnosis not present

## 2018-08-04 DIAGNOSIS — Z720 Tobacco use: Secondary | ICD-10-CM | POA: Diagnosis not present

## 2018-08-04 DIAGNOSIS — Z1331 Encounter for screening for depression: Secondary | ICD-10-CM | POA: Diagnosis not present

## 2018-08-04 DIAGNOSIS — I10 Essential (primary) hypertension: Secondary | ICD-10-CM | POA: Diagnosis not present

## 2018-08-04 DIAGNOSIS — Z1389 Encounter for screening for other disorder: Secondary | ICD-10-CM | POA: Diagnosis not present

## 2018-08-04 DIAGNOSIS — K219 Gastro-esophageal reflux disease without esophagitis: Secondary | ICD-10-CM | POA: Diagnosis not present

## 2018-08-04 DIAGNOSIS — Z23 Encounter for immunization: Secondary | ICD-10-CM | POA: Diagnosis not present

## 2018-08-04 DIAGNOSIS — F419 Anxiety disorder, unspecified: Secondary | ICD-10-CM | POA: Diagnosis not present

## 2018-08-04 DIAGNOSIS — I739 Peripheral vascular disease, unspecified: Secondary | ICD-10-CM | POA: Diagnosis not present

## 2018-08-04 DIAGNOSIS — G252 Other specified forms of tremor: Secondary | ICD-10-CM | POA: Diagnosis not present

## 2018-08-04 DIAGNOSIS — E1140 Type 2 diabetes mellitus with diabetic neuropathy, unspecified: Secondary | ICD-10-CM | POA: Diagnosis not present

## 2018-08-04 DIAGNOSIS — Z6830 Body mass index (BMI) 30.0-30.9, adult: Secondary | ICD-10-CM | POA: Diagnosis not present

## 2018-11-22 NOTE — Progress Notes (Signed)
REVIEWED-NO ADDITIONAL RECOMMENDATIONS. 

## 2018-11-30 DIAGNOSIS — E1140 Type 2 diabetes mellitus with diabetic neuropathy, unspecified: Secondary | ICD-10-CM | POA: Diagnosis not present

## 2018-11-30 DIAGNOSIS — Z720 Tobacco use: Secondary | ICD-10-CM | POA: Diagnosis not present

## 2018-11-30 DIAGNOSIS — Z6831 Body mass index (BMI) 31.0-31.9, adult: Secondary | ICD-10-CM | POA: Diagnosis not present

## 2018-11-30 DIAGNOSIS — M47812 Spondylosis without myelopathy or radiculopathy, cervical region: Secondary | ICD-10-CM | POA: Diagnosis not present

## 2018-11-30 DIAGNOSIS — K219 Gastro-esophageal reflux disease without esophagitis: Secondary | ICD-10-CM | POA: Diagnosis not present

## 2018-11-30 DIAGNOSIS — I10 Essential (primary) hypertension: Secondary | ICD-10-CM | POA: Diagnosis not present

## 2018-11-30 DIAGNOSIS — I739 Peripheral vascular disease, unspecified: Secondary | ICD-10-CM | POA: Diagnosis not present

## 2018-11-30 DIAGNOSIS — F419 Anxiety disorder, unspecified: Secondary | ICD-10-CM | POA: Diagnosis not present

## 2018-11-30 DIAGNOSIS — R1011 Right upper quadrant pain: Secondary | ICD-10-CM | POA: Diagnosis not present

## 2018-11-30 DIAGNOSIS — Z0000 Encounter for general adult medical examination without abnormal findings: Secondary | ICD-10-CM | POA: Diagnosis not present

## 2018-12-23 DIAGNOSIS — Z1231 Encounter for screening mammogram for malignant neoplasm of breast: Secondary | ICD-10-CM | POA: Diagnosis not present

## 2019-01-03 DIAGNOSIS — H40053 Ocular hypertension, bilateral: Secondary | ICD-10-CM | POA: Diagnosis not present

## 2019-01-03 DIAGNOSIS — H35033 Hypertensive retinopathy, bilateral: Secondary | ICD-10-CM | POA: Diagnosis not present

## 2019-01-04 DIAGNOSIS — Z95828 Presence of other vascular implants and grafts: Secondary | ICD-10-CM | POA: Diagnosis not present

## 2019-01-04 DIAGNOSIS — Z7901 Long term (current) use of anticoagulants: Secondary | ICD-10-CM | POA: Diagnosis not present

## 2019-01-04 DIAGNOSIS — Z01812 Encounter for preprocedural laboratory examination: Secondary | ICD-10-CM | POA: Diagnosis not present

## 2019-01-04 DIAGNOSIS — T82898A Other specified complication of vascular prosthetic devices, implants and grafts, initial encounter: Secondary | ICD-10-CM | POA: Diagnosis not present

## 2019-01-04 DIAGNOSIS — T82868A Thrombosis of vascular prosthetic devices, implants and grafts, initial encounter: Secondary | ICD-10-CM | POA: Diagnosis not present

## 2019-01-04 DIAGNOSIS — M79605 Pain in left leg: Secondary | ICD-10-CM | POA: Diagnosis not present

## 2019-01-04 DIAGNOSIS — I739 Peripheral vascular disease, unspecified: Secondary | ICD-10-CM | POA: Diagnosis not present

## 2019-01-04 DIAGNOSIS — M79672 Pain in left foot: Secondary | ICD-10-CM | POA: Diagnosis not present

## 2019-01-04 DIAGNOSIS — I779 Disorder of arteries and arterioles, unspecified: Secondary | ICD-10-CM | POA: Diagnosis not present

## 2019-01-06 DIAGNOSIS — Z7901 Long term (current) use of anticoagulants: Secondary | ICD-10-CM | POA: Diagnosis not present

## 2019-01-06 DIAGNOSIS — Z01818 Encounter for other preprocedural examination: Secondary | ICD-10-CM | POA: Diagnosis not present

## 2019-01-06 DIAGNOSIS — Z01812 Encounter for preprocedural laboratory examination: Secondary | ICD-10-CM | POA: Diagnosis not present

## 2019-01-06 DIAGNOSIS — I779 Disorder of arteries and arterioles, unspecified: Secondary | ICD-10-CM | POA: Diagnosis not present

## 2019-01-09 DIAGNOSIS — N99841 Postprocedural hematoma of a genitourinary system organ or structure following other procedure: Secondary | ICD-10-CM | POA: Diagnosis not present

## 2019-01-09 DIAGNOSIS — E785 Hyperlipidemia, unspecified: Secondary | ICD-10-CM | POA: Diagnosis not present

## 2019-01-09 DIAGNOSIS — I499 Cardiac arrhythmia, unspecified: Secondary | ICD-10-CM | POA: Diagnosis not present

## 2019-01-09 DIAGNOSIS — N2889 Other specified disorders of kidney and ureter: Secondary | ICD-10-CM | POA: Diagnosis not present

## 2019-01-09 DIAGNOSIS — K661 Hemoperitoneum: Secondary | ICD-10-CM | POA: Diagnosis not present

## 2019-01-09 DIAGNOSIS — Z86711 Personal history of pulmonary embolism: Secondary | ICD-10-CM | POA: Diagnosis not present

## 2019-01-09 DIAGNOSIS — F17210 Nicotine dependence, cigarettes, uncomplicated: Secondary | ICD-10-CM | POA: Diagnosis not present

## 2019-01-09 DIAGNOSIS — T82898A Other specified complication of vascular prosthetic devices, implants and grafts, initial encounter: Secondary | ICD-10-CM | POA: Diagnosis not present

## 2019-01-09 DIAGNOSIS — Z7982 Long term (current) use of aspirin: Secondary | ICD-10-CM | POA: Diagnosis not present

## 2019-01-09 DIAGNOSIS — J449 Chronic obstructive pulmonary disease, unspecified: Secondary | ICD-10-CM | POA: Diagnosis not present

## 2019-01-09 DIAGNOSIS — R001 Bradycardia, unspecified: Secondary | ICD-10-CM | POA: Diagnosis not present

## 2019-01-09 DIAGNOSIS — F329 Major depressive disorder, single episode, unspecified: Secondary | ICD-10-CM | POA: Diagnosis not present

## 2019-01-09 DIAGNOSIS — K219 Gastro-esophageal reflux disease without esophagitis: Secondary | ICD-10-CM | POA: Diagnosis not present

## 2019-01-09 DIAGNOSIS — Z87891 Personal history of nicotine dependence: Secondary | ICD-10-CM | POA: Diagnosis not present

## 2019-01-09 DIAGNOSIS — I739 Peripheral vascular disease, unspecified: Secondary | ICD-10-CM | POA: Diagnosis not present

## 2019-01-09 DIAGNOSIS — I10 Essential (primary) hypertension: Secondary | ICD-10-CM | POA: Diagnosis not present

## 2019-01-09 DIAGNOSIS — E7800 Pure hypercholesterolemia, unspecified: Secondary | ICD-10-CM | POA: Diagnosis not present

## 2019-01-09 DIAGNOSIS — Z7902 Long term (current) use of antithrombotics/antiplatelets: Secondary | ICD-10-CM | POA: Diagnosis not present

## 2019-01-09 DIAGNOSIS — E1140 Type 2 diabetes mellitus with diabetic neuropathy, unspecified: Secondary | ICD-10-CM | POA: Diagnosis not present

## 2019-01-09 DIAGNOSIS — R58 Hemorrhage, not elsewhere classified: Secondary | ICD-10-CM | POA: Diagnosis not present

## 2019-01-09 DIAGNOSIS — N179 Acute kidney failure, unspecified: Secondary | ICD-10-CM | POA: Diagnosis not present

## 2019-01-09 DIAGNOSIS — E1151 Type 2 diabetes mellitus with diabetic peripheral angiopathy without gangrene: Secondary | ICD-10-CM | POA: Diagnosis not present

## 2019-01-09 DIAGNOSIS — E119 Type 2 diabetes mellitus without complications: Secondary | ICD-10-CM | POA: Diagnosis not present

## 2019-01-09 DIAGNOSIS — S37011A Minor contusion of right kidney, initial encounter: Secondary | ICD-10-CM | POA: Diagnosis not present

## 2019-01-10 DIAGNOSIS — I739 Peripheral vascular disease, unspecified: Secondary | ICD-10-CM | POA: Diagnosis not present

## 2019-01-10 DIAGNOSIS — E119 Type 2 diabetes mellitus without complications: Secondary | ICD-10-CM | POA: Diagnosis not present

## 2019-01-10 DIAGNOSIS — K661 Hemoperitoneum: Secondary | ICD-10-CM | POA: Diagnosis not present

## 2019-01-10 DIAGNOSIS — N99841 Postprocedural hematoma of a genitourinary system organ or structure following other procedure: Secondary | ICD-10-CM | POA: Diagnosis not present

## 2019-01-11 DIAGNOSIS — N2889 Other specified disorders of kidney and ureter: Secondary | ICD-10-CM | POA: Diagnosis not present

## 2019-01-13 DIAGNOSIS — N99840 Postprocedural hematoma of a genitourinary system organ or structure following a genitourinary system procedure: Secondary | ICD-10-CM | POA: Diagnosis not present

## 2019-01-13 DIAGNOSIS — E1151 Type 2 diabetes mellitus with diabetic peripheral angiopathy without gangrene: Secondary | ICD-10-CM | POA: Diagnosis not present

## 2019-01-13 DIAGNOSIS — N2889 Other specified disorders of kidney and ureter: Secondary | ICD-10-CM | POA: Diagnosis not present

## 2019-01-13 DIAGNOSIS — D649 Anemia, unspecified: Secondary | ICD-10-CM | POA: Diagnosis not present

## 2019-01-13 DIAGNOSIS — J449 Chronic obstructive pulmonary disease, unspecified: Secondary | ICD-10-CM | POA: Diagnosis not present

## 2019-01-13 DIAGNOSIS — R109 Unspecified abdominal pain: Secondary | ICD-10-CM | POA: Diagnosis not present

## 2019-01-13 DIAGNOSIS — I10 Essential (primary) hypertension: Secondary | ICD-10-CM | POA: Diagnosis not present

## 2019-01-13 DIAGNOSIS — Z794 Long term (current) use of insulin: Secondary | ICD-10-CM | POA: Diagnosis not present

## 2019-01-13 DIAGNOSIS — Z86711 Personal history of pulmonary embolism: Secondary | ICD-10-CM | POA: Diagnosis not present

## 2019-01-18 DIAGNOSIS — I779 Disorder of arteries and arterioles, unspecified: Secondary | ICD-10-CM | POA: Diagnosis not present

## 2019-01-25 DIAGNOSIS — F17210 Nicotine dependence, cigarettes, uncomplicated: Secondary | ICD-10-CM | POA: Diagnosis not present

## 2019-01-25 DIAGNOSIS — Z95828 Presence of other vascular implants and grafts: Secondary | ICD-10-CM | POA: Diagnosis not present

## 2019-01-25 DIAGNOSIS — I10 Essential (primary) hypertension: Secondary | ICD-10-CM | POA: Diagnosis not present

## 2019-01-25 DIAGNOSIS — I739 Peripheral vascular disease, unspecified: Secondary | ICD-10-CM | POA: Diagnosis not present

## 2019-01-25 DIAGNOSIS — R0789 Other chest pain: Secondary | ICD-10-CM | POA: Diagnosis not present

## 2019-01-25 DIAGNOSIS — E785 Hyperlipidemia, unspecified: Secondary | ICD-10-CM | POA: Diagnosis not present

## 2019-01-25 DIAGNOSIS — Z01810 Encounter for preprocedural cardiovascular examination: Secondary | ICD-10-CM | POA: Diagnosis not present

## 2019-01-25 DIAGNOSIS — I779 Disorder of arteries and arterioles, unspecified: Secondary | ICD-10-CM | POA: Diagnosis not present

## 2019-01-25 DIAGNOSIS — R0600 Dyspnea, unspecified: Secondary | ICD-10-CM | POA: Diagnosis not present

## 2019-01-25 DIAGNOSIS — E1100 Type 2 diabetes mellitus with hyperosmolarity without nonketotic hyperglycemic-hyperosmolar coma (NKHHC): Secondary | ICD-10-CM | POA: Diagnosis not present

## 2019-01-25 DIAGNOSIS — Z794 Long term (current) use of insulin: Secondary | ICD-10-CM | POA: Diagnosis not present

## 2019-01-25 DIAGNOSIS — R9431 Abnormal electrocardiogram [ECG] [EKG]: Secondary | ICD-10-CM | POA: Diagnosis not present

## 2019-01-27 DIAGNOSIS — S37011A Minor contusion of right kidney, initial encounter: Secondary | ICD-10-CM | POA: Diagnosis not present

## 2019-01-31 DIAGNOSIS — R9431 Abnormal electrocardiogram [ECG] [EKG]: Secondary | ICD-10-CM | POA: Diagnosis not present

## 2019-01-31 DIAGNOSIS — I779 Disorder of arteries and arterioles, unspecified: Secondary | ICD-10-CM | POA: Diagnosis not present

## 2019-01-31 DIAGNOSIS — R0789 Other chest pain: Secondary | ICD-10-CM | POA: Diagnosis not present

## 2019-01-31 DIAGNOSIS — Z01810 Encounter for preprocedural cardiovascular examination: Secondary | ICD-10-CM | POA: Diagnosis not present

## 2019-01-31 DIAGNOSIS — R0600 Dyspnea, unspecified: Secondary | ICD-10-CM | POA: Diagnosis not present

## 2019-01-31 DIAGNOSIS — Z794 Long term (current) use of insulin: Secondary | ICD-10-CM | POA: Diagnosis not present

## 2019-01-31 DIAGNOSIS — E1100 Type 2 diabetes mellitus with hyperosmolarity without nonketotic hyperglycemic-hyperosmolar coma (NKHHC): Secondary | ICD-10-CM | POA: Diagnosis not present

## 2019-02-08 DIAGNOSIS — I779 Disorder of arteries and arterioles, unspecified: Secondary | ICD-10-CM | POA: Diagnosis not present

## 2019-02-08 DIAGNOSIS — M79605 Pain in left leg: Secondary | ICD-10-CM | POA: Diagnosis not present

## 2019-02-08 DIAGNOSIS — Z01812 Encounter for preprocedural laboratory examination: Secondary | ICD-10-CM | POA: Diagnosis not present

## 2019-02-11 DIAGNOSIS — Z01812 Encounter for preprocedural laboratory examination: Secondary | ICD-10-CM | POA: Diagnosis not present

## 2019-02-14 DIAGNOSIS — F17210 Nicotine dependence, cigarettes, uncomplicated: Secondary | ICD-10-CM | POA: Diagnosis present

## 2019-02-14 DIAGNOSIS — Z6831 Body mass index (BMI) 31.0-31.9, adult: Secondary | ICD-10-CM | POA: Diagnosis not present

## 2019-02-14 DIAGNOSIS — J449 Chronic obstructive pulmonary disease, unspecified: Secondary | ICD-10-CM | POA: Diagnosis present

## 2019-02-14 DIAGNOSIS — S37091A Other injury of right kidney, initial encounter: Secondary | ICD-10-CM | POA: Diagnosis not present

## 2019-02-14 DIAGNOSIS — E871 Hypo-osmolality and hyponatremia: Secondary | ICD-10-CM | POA: Diagnosis not present

## 2019-02-14 DIAGNOSIS — S37011A Minor contusion of right kidney, initial encounter: Secondary | ICD-10-CM | POA: Diagnosis present

## 2019-02-14 DIAGNOSIS — Z720 Tobacco use: Secondary | ICD-10-CM | POA: Diagnosis not present

## 2019-02-14 DIAGNOSIS — Z7982 Long term (current) use of aspirin: Secondary | ICD-10-CM | POA: Diagnosis not present

## 2019-02-14 DIAGNOSIS — I129 Hypertensive chronic kidney disease with stage 1 through stage 4 chronic kidney disease, or unspecified chronic kidney disease: Secondary | ICD-10-CM | POA: Diagnosis present

## 2019-02-14 DIAGNOSIS — F419 Anxiety disorder, unspecified: Secondary | ICD-10-CM | POA: Diagnosis present

## 2019-02-14 DIAGNOSIS — M1990 Unspecified osteoarthritis, unspecified site: Secondary | ICD-10-CM | POA: Diagnosis present

## 2019-02-14 DIAGNOSIS — T82898A Other specified complication of vascular prosthetic devices, implants and grafts, initial encounter: Secondary | ICD-10-CM | POA: Diagnosis present

## 2019-02-14 DIAGNOSIS — E1151 Type 2 diabetes mellitus with diabetic peripheral angiopathy without gangrene: Secondary | ICD-10-CM | POA: Diagnosis not present

## 2019-02-14 DIAGNOSIS — E785 Hyperlipidemia, unspecified: Secondary | ICD-10-CM | POA: Diagnosis present

## 2019-02-14 DIAGNOSIS — E669 Obesity, unspecified: Secondary | ICD-10-CM | POA: Diagnosis present

## 2019-02-14 DIAGNOSIS — K760 Fatty (change of) liver, not elsewhere classified: Secondary | ICD-10-CM | POA: Diagnosis present

## 2019-02-14 DIAGNOSIS — K219 Gastro-esophageal reflux disease without esophagitis: Secondary | ICD-10-CM | POA: Diagnosis present

## 2019-02-14 DIAGNOSIS — I10 Essential (primary) hypertension: Secondary | ICD-10-CM | POA: Diagnosis not present

## 2019-02-14 DIAGNOSIS — I743 Embolism and thrombosis of arteries of the lower extremities: Secondary | ICD-10-CM | POA: Diagnosis not present

## 2019-02-14 DIAGNOSIS — E1165 Type 2 diabetes mellitus with hyperglycemia: Secondary | ICD-10-CM | POA: Diagnosis present

## 2019-02-14 DIAGNOSIS — D62 Acute posthemorrhagic anemia: Secondary | ICD-10-CM | POA: Diagnosis present

## 2019-02-14 DIAGNOSIS — I70222 Atherosclerosis of native arteries of extremities with rest pain, left leg: Secondary | ICD-10-CM | POA: Diagnosis not present

## 2019-02-14 DIAGNOSIS — G43909 Migraine, unspecified, not intractable, without status migrainosus: Secondary | ICD-10-CM | POA: Diagnosis present

## 2019-02-14 DIAGNOSIS — Z79899 Other long term (current) drug therapy: Secondary | ICD-10-CM | POA: Diagnosis not present

## 2019-02-14 DIAGNOSIS — E1142 Type 2 diabetes mellitus with diabetic polyneuropathy: Secondary | ICD-10-CM | POA: Diagnosis present

## 2019-02-14 DIAGNOSIS — I739 Peripheral vascular disease, unspecified: Secondary | ICD-10-CM | POA: Diagnosis not present

## 2019-02-14 DIAGNOSIS — E875 Hyperkalemia: Secondary | ICD-10-CM | POA: Diagnosis not present

## 2019-02-14 DIAGNOSIS — N183 Chronic kidney disease, stage 3 (moderate): Secondary | ICD-10-CM | POA: Diagnosis present

## 2019-02-14 DIAGNOSIS — E1122 Type 2 diabetes mellitus with diabetic chronic kidney disease: Secondary | ICD-10-CM | POA: Diagnosis present

## 2019-02-16 DIAGNOSIS — S37091A Other injury of right kidney, initial encounter: Secondary | ICD-10-CM | POA: Diagnosis not present

## 2019-02-21 DIAGNOSIS — M1991 Primary osteoarthritis, unspecified site: Secondary | ICD-10-CM | POA: Diagnosis not present

## 2019-02-21 DIAGNOSIS — J449 Chronic obstructive pulmonary disease, unspecified: Secondary | ICD-10-CM | POA: Diagnosis not present

## 2019-02-21 DIAGNOSIS — F17210 Nicotine dependence, cigarettes, uncomplicated: Secondary | ICD-10-CM | POA: Diagnosis not present

## 2019-02-21 DIAGNOSIS — Z8701 Personal history of pneumonia (recurrent): Secondary | ICD-10-CM | POA: Diagnosis not present

## 2019-02-21 DIAGNOSIS — K219 Gastro-esophageal reflux disease without esophagitis: Secondary | ICD-10-CM | POA: Diagnosis not present

## 2019-02-21 DIAGNOSIS — Z794 Long term (current) use of insulin: Secondary | ICD-10-CM | POA: Diagnosis not present

## 2019-02-21 DIAGNOSIS — Z7982 Long term (current) use of aspirin: Secondary | ICD-10-CM | POA: Diagnosis not present

## 2019-02-21 DIAGNOSIS — Z48812 Encounter for surgical aftercare following surgery on the circulatory system: Secondary | ICD-10-CM | POA: Diagnosis not present

## 2019-02-21 DIAGNOSIS — I10 Essential (primary) hypertension: Secondary | ICD-10-CM | POA: Diagnosis not present

## 2019-02-21 DIAGNOSIS — F419 Anxiety disorder, unspecified: Secondary | ICD-10-CM | POA: Diagnosis not present

## 2019-02-21 DIAGNOSIS — G43909 Migraine, unspecified, not intractable, without status migrainosus: Secondary | ICD-10-CM | POA: Diagnosis not present

## 2019-02-21 DIAGNOSIS — E1140 Type 2 diabetes mellitus with diabetic neuropathy, unspecified: Secondary | ICD-10-CM | POA: Diagnosis not present

## 2019-02-21 DIAGNOSIS — E1151 Type 2 diabetes mellitus with diabetic peripheral angiopathy without gangrene: Secondary | ICD-10-CM | POA: Diagnosis not present

## 2019-02-21 DIAGNOSIS — Z86711 Personal history of pulmonary embolism: Secondary | ICD-10-CM | POA: Diagnosis not present

## 2019-02-21 DIAGNOSIS — F329 Major depressive disorder, single episode, unspecified: Secondary | ICD-10-CM | POA: Diagnosis not present

## 2019-02-21 DIAGNOSIS — Z7902 Long term (current) use of antithrombotics/antiplatelets: Secondary | ICD-10-CM | POA: Diagnosis not present

## 2019-02-22 DIAGNOSIS — Z48812 Encounter for surgical aftercare following surgery on the circulatory system: Secondary | ICD-10-CM | POA: Diagnosis not present

## 2019-02-22 DIAGNOSIS — E1140 Type 2 diabetes mellitus with diabetic neuropathy, unspecified: Secondary | ICD-10-CM | POA: Diagnosis not present

## 2019-02-22 DIAGNOSIS — I10 Essential (primary) hypertension: Secondary | ICD-10-CM | POA: Diagnosis not present

## 2019-02-22 DIAGNOSIS — F419 Anxiety disorder, unspecified: Secondary | ICD-10-CM | POA: Diagnosis not present

## 2019-02-22 DIAGNOSIS — J449 Chronic obstructive pulmonary disease, unspecified: Secondary | ICD-10-CM | POA: Diagnosis not present

## 2019-02-22 DIAGNOSIS — E1151 Type 2 diabetes mellitus with diabetic peripheral angiopathy without gangrene: Secondary | ICD-10-CM | POA: Diagnosis not present

## 2019-02-24 DIAGNOSIS — I10 Essential (primary) hypertension: Secondary | ICD-10-CM | POA: Diagnosis not present

## 2019-02-24 DIAGNOSIS — J449 Chronic obstructive pulmonary disease, unspecified: Secondary | ICD-10-CM | POA: Diagnosis not present

## 2019-02-24 DIAGNOSIS — E1140 Type 2 diabetes mellitus with diabetic neuropathy, unspecified: Secondary | ICD-10-CM | POA: Diagnosis not present

## 2019-02-24 DIAGNOSIS — E1151 Type 2 diabetes mellitus with diabetic peripheral angiopathy without gangrene: Secondary | ICD-10-CM | POA: Diagnosis not present

## 2019-02-24 DIAGNOSIS — Z48812 Encounter for surgical aftercare following surgery on the circulatory system: Secondary | ICD-10-CM | POA: Diagnosis not present

## 2019-02-24 DIAGNOSIS — F419 Anxiety disorder, unspecified: Secondary | ICD-10-CM | POA: Diagnosis not present

## 2019-02-27 DIAGNOSIS — Z48812 Encounter for surgical aftercare following surgery on the circulatory system: Secondary | ICD-10-CM | POA: Diagnosis not present

## 2019-02-27 DIAGNOSIS — E1140 Type 2 diabetes mellitus with diabetic neuropathy, unspecified: Secondary | ICD-10-CM | POA: Diagnosis not present

## 2019-02-27 DIAGNOSIS — E1151 Type 2 diabetes mellitus with diabetic peripheral angiopathy without gangrene: Secondary | ICD-10-CM | POA: Diagnosis not present

## 2019-02-27 DIAGNOSIS — J449 Chronic obstructive pulmonary disease, unspecified: Secondary | ICD-10-CM | POA: Diagnosis not present

## 2019-02-27 DIAGNOSIS — I10 Essential (primary) hypertension: Secondary | ICD-10-CM | POA: Diagnosis not present

## 2019-02-27 DIAGNOSIS — F419 Anxiety disorder, unspecified: Secondary | ICD-10-CM | POA: Diagnosis not present

## 2019-02-28 DIAGNOSIS — I10 Essential (primary) hypertension: Secondary | ICD-10-CM | POA: Diagnosis not present

## 2019-02-28 DIAGNOSIS — E1151 Type 2 diabetes mellitus with diabetic peripheral angiopathy without gangrene: Secondary | ICD-10-CM | POA: Diagnosis not present

## 2019-02-28 DIAGNOSIS — J449 Chronic obstructive pulmonary disease, unspecified: Secondary | ICD-10-CM | POA: Diagnosis not present

## 2019-02-28 DIAGNOSIS — Z48812 Encounter for surgical aftercare following surgery on the circulatory system: Secondary | ICD-10-CM | POA: Diagnosis not present

## 2019-02-28 DIAGNOSIS — F419 Anxiety disorder, unspecified: Secondary | ICD-10-CM | POA: Diagnosis not present

## 2019-02-28 DIAGNOSIS — E1140 Type 2 diabetes mellitus with diabetic neuropathy, unspecified: Secondary | ICD-10-CM | POA: Diagnosis not present

## 2019-03-02 DIAGNOSIS — E1140 Type 2 diabetes mellitus with diabetic neuropathy, unspecified: Secondary | ICD-10-CM | POA: Diagnosis not present

## 2019-03-02 DIAGNOSIS — K219 Gastro-esophageal reflux disease without esophagitis: Secondary | ICD-10-CM | POA: Diagnosis not present

## 2019-03-02 DIAGNOSIS — I739 Peripheral vascular disease, unspecified: Secondary | ICD-10-CM | POA: Diagnosis not present

## 2019-03-02 DIAGNOSIS — Z720 Tobacco use: Secondary | ICD-10-CM | POA: Diagnosis not present

## 2019-03-02 DIAGNOSIS — Z6830 Body mass index (BMI) 30.0-30.9, adult: Secondary | ICD-10-CM | POA: Diagnosis not present

## 2019-03-02 DIAGNOSIS — F419 Anxiety disorder, unspecified: Secondary | ICD-10-CM | POA: Diagnosis not present

## 2019-03-02 DIAGNOSIS — I10 Essential (primary) hypertension: Secondary | ICD-10-CM | POA: Diagnosis not present

## 2019-03-02 DIAGNOSIS — M47812 Spondylosis without myelopathy or radiculopathy, cervical region: Secondary | ICD-10-CM | POA: Diagnosis not present

## 2019-03-03 DIAGNOSIS — E1151 Type 2 diabetes mellitus with diabetic peripheral angiopathy without gangrene: Secondary | ICD-10-CM | POA: Diagnosis not present

## 2019-03-03 DIAGNOSIS — Z48812 Encounter for surgical aftercare following surgery on the circulatory system: Secondary | ICD-10-CM | POA: Diagnosis not present

## 2019-03-03 DIAGNOSIS — F419 Anxiety disorder, unspecified: Secondary | ICD-10-CM | POA: Diagnosis not present

## 2019-03-03 DIAGNOSIS — I10 Essential (primary) hypertension: Secondary | ICD-10-CM | POA: Diagnosis not present

## 2019-03-03 DIAGNOSIS — J449 Chronic obstructive pulmonary disease, unspecified: Secondary | ICD-10-CM | POA: Diagnosis not present

## 2019-03-03 DIAGNOSIS — E1140 Type 2 diabetes mellitus with diabetic neuropathy, unspecified: Secondary | ICD-10-CM | POA: Diagnosis not present

## 2019-03-07 DIAGNOSIS — E1151 Type 2 diabetes mellitus with diabetic peripheral angiopathy without gangrene: Secondary | ICD-10-CM | POA: Diagnosis not present

## 2019-03-07 DIAGNOSIS — Z48812 Encounter for surgical aftercare following surgery on the circulatory system: Secondary | ICD-10-CM | POA: Diagnosis not present

## 2019-03-07 DIAGNOSIS — F419 Anxiety disorder, unspecified: Secondary | ICD-10-CM | POA: Diagnosis not present

## 2019-03-07 DIAGNOSIS — J449 Chronic obstructive pulmonary disease, unspecified: Secondary | ICD-10-CM | POA: Diagnosis not present

## 2019-03-07 DIAGNOSIS — E1140 Type 2 diabetes mellitus with diabetic neuropathy, unspecified: Secondary | ICD-10-CM | POA: Diagnosis not present

## 2019-03-07 DIAGNOSIS — I10 Essential (primary) hypertension: Secondary | ICD-10-CM | POA: Diagnosis not present

## 2019-03-08 DIAGNOSIS — Z48812 Encounter for surgical aftercare following surgery on the circulatory system: Secondary | ICD-10-CM | POA: Diagnosis not present

## 2019-03-08 DIAGNOSIS — I10 Essential (primary) hypertension: Secondary | ICD-10-CM | POA: Diagnosis not present

## 2019-03-08 DIAGNOSIS — J449 Chronic obstructive pulmonary disease, unspecified: Secondary | ICD-10-CM | POA: Diagnosis not present

## 2019-03-08 DIAGNOSIS — F419 Anxiety disorder, unspecified: Secondary | ICD-10-CM | POA: Diagnosis not present

## 2019-03-08 DIAGNOSIS — E1140 Type 2 diabetes mellitus with diabetic neuropathy, unspecified: Secondary | ICD-10-CM | POA: Diagnosis not present

## 2019-03-08 DIAGNOSIS — E1151 Type 2 diabetes mellitus with diabetic peripheral angiopathy without gangrene: Secondary | ICD-10-CM | POA: Diagnosis not present

## 2019-03-09 DIAGNOSIS — F419 Anxiety disorder, unspecified: Secondary | ICD-10-CM | POA: Diagnosis not present

## 2019-03-09 DIAGNOSIS — E1151 Type 2 diabetes mellitus with diabetic peripheral angiopathy without gangrene: Secondary | ICD-10-CM | POA: Diagnosis not present

## 2019-03-09 DIAGNOSIS — E1140 Type 2 diabetes mellitus with diabetic neuropathy, unspecified: Secondary | ICD-10-CM | POA: Diagnosis not present

## 2019-03-09 DIAGNOSIS — J449 Chronic obstructive pulmonary disease, unspecified: Secondary | ICD-10-CM | POA: Diagnosis not present

## 2019-03-09 DIAGNOSIS — Z48812 Encounter for surgical aftercare following surgery on the circulatory system: Secondary | ICD-10-CM | POA: Diagnosis not present

## 2019-03-09 DIAGNOSIS — I10 Essential (primary) hypertension: Secondary | ICD-10-CM | POA: Diagnosis not present

## 2019-03-14 DIAGNOSIS — I10 Essential (primary) hypertension: Secondary | ICD-10-CM | POA: Diagnosis not present

## 2019-03-14 DIAGNOSIS — E1140 Type 2 diabetes mellitus with diabetic neuropathy, unspecified: Secondary | ICD-10-CM | POA: Diagnosis not present

## 2019-03-14 DIAGNOSIS — Z48812 Encounter for surgical aftercare following surgery on the circulatory system: Secondary | ICD-10-CM | POA: Diagnosis not present

## 2019-03-14 DIAGNOSIS — F419 Anxiety disorder, unspecified: Secondary | ICD-10-CM | POA: Diagnosis not present

## 2019-03-14 DIAGNOSIS — E1151 Type 2 diabetes mellitus with diabetic peripheral angiopathy without gangrene: Secondary | ICD-10-CM | POA: Diagnosis not present

## 2019-03-14 DIAGNOSIS — J449 Chronic obstructive pulmonary disease, unspecified: Secondary | ICD-10-CM | POA: Diagnosis not present

## 2019-03-16 DIAGNOSIS — J449 Chronic obstructive pulmonary disease, unspecified: Secondary | ICD-10-CM | POA: Diagnosis not present

## 2019-03-16 DIAGNOSIS — I10 Essential (primary) hypertension: Secondary | ICD-10-CM | POA: Diagnosis not present

## 2019-03-16 DIAGNOSIS — E1151 Type 2 diabetes mellitus with diabetic peripheral angiopathy without gangrene: Secondary | ICD-10-CM | POA: Diagnosis not present

## 2019-03-16 DIAGNOSIS — E1140 Type 2 diabetes mellitus with diabetic neuropathy, unspecified: Secondary | ICD-10-CM | POA: Diagnosis not present

## 2019-03-16 DIAGNOSIS — Z48812 Encounter for surgical aftercare following surgery on the circulatory system: Secondary | ICD-10-CM | POA: Diagnosis not present

## 2019-03-16 DIAGNOSIS — F419 Anxiety disorder, unspecified: Secondary | ICD-10-CM | POA: Diagnosis not present

## 2019-03-17 DIAGNOSIS — F419 Anxiety disorder, unspecified: Secondary | ICD-10-CM | POA: Diagnosis not present

## 2019-03-17 DIAGNOSIS — J449 Chronic obstructive pulmonary disease, unspecified: Secondary | ICD-10-CM | POA: Diagnosis not present

## 2019-03-17 DIAGNOSIS — Z48812 Encounter for surgical aftercare following surgery on the circulatory system: Secondary | ICD-10-CM | POA: Diagnosis not present

## 2019-03-17 DIAGNOSIS — E1151 Type 2 diabetes mellitus with diabetic peripheral angiopathy without gangrene: Secondary | ICD-10-CM | POA: Diagnosis not present

## 2019-03-17 DIAGNOSIS — I10 Essential (primary) hypertension: Secondary | ICD-10-CM | POA: Diagnosis not present

## 2019-03-17 DIAGNOSIS — E1140 Type 2 diabetes mellitus with diabetic neuropathy, unspecified: Secondary | ICD-10-CM | POA: Diagnosis not present

## 2019-03-22 DIAGNOSIS — F419 Anxiety disorder, unspecified: Secondary | ICD-10-CM | POA: Diagnosis not present

## 2019-03-22 DIAGNOSIS — Z48812 Encounter for surgical aftercare following surgery on the circulatory system: Secondary | ICD-10-CM | POA: Diagnosis not present

## 2019-03-22 DIAGNOSIS — E1151 Type 2 diabetes mellitus with diabetic peripheral angiopathy without gangrene: Secondary | ICD-10-CM | POA: Diagnosis not present

## 2019-03-22 DIAGNOSIS — J449 Chronic obstructive pulmonary disease, unspecified: Secondary | ICD-10-CM | POA: Diagnosis not present

## 2019-03-22 DIAGNOSIS — E1140 Type 2 diabetes mellitus with diabetic neuropathy, unspecified: Secondary | ICD-10-CM | POA: Diagnosis not present

## 2019-03-22 DIAGNOSIS — I10 Essential (primary) hypertension: Secondary | ICD-10-CM | POA: Diagnosis not present

## 2019-03-23 DIAGNOSIS — F17210 Nicotine dependence, cigarettes, uncomplicated: Secondary | ICD-10-CM | POA: Diagnosis not present

## 2019-03-23 DIAGNOSIS — Z86711 Personal history of pulmonary embolism: Secondary | ICD-10-CM | POA: Diagnosis not present

## 2019-03-23 DIAGNOSIS — Z7902 Long term (current) use of antithrombotics/antiplatelets: Secondary | ICD-10-CM | POA: Diagnosis not present

## 2019-03-23 DIAGNOSIS — Z7982 Long term (current) use of aspirin: Secondary | ICD-10-CM | POA: Diagnosis not present

## 2019-03-23 DIAGNOSIS — M1991 Primary osteoarthritis, unspecified site: Secondary | ICD-10-CM | POA: Diagnosis not present

## 2019-03-23 DIAGNOSIS — E1151 Type 2 diabetes mellitus with diabetic peripheral angiopathy without gangrene: Secondary | ICD-10-CM | POA: Diagnosis not present

## 2019-03-23 DIAGNOSIS — Z48812 Encounter for surgical aftercare following surgery on the circulatory system: Secondary | ICD-10-CM | POA: Diagnosis not present

## 2019-03-23 DIAGNOSIS — F329 Major depressive disorder, single episode, unspecified: Secondary | ICD-10-CM | POA: Diagnosis not present

## 2019-03-23 DIAGNOSIS — Z8701 Personal history of pneumonia (recurrent): Secondary | ICD-10-CM | POA: Diagnosis not present

## 2019-03-23 DIAGNOSIS — E1140 Type 2 diabetes mellitus with diabetic neuropathy, unspecified: Secondary | ICD-10-CM | POA: Diagnosis not present

## 2019-03-23 DIAGNOSIS — G43909 Migraine, unspecified, not intractable, without status migrainosus: Secondary | ICD-10-CM | POA: Diagnosis not present

## 2019-03-23 DIAGNOSIS — F419 Anxiety disorder, unspecified: Secondary | ICD-10-CM | POA: Diagnosis not present

## 2019-03-23 DIAGNOSIS — Z794 Long term (current) use of insulin: Secondary | ICD-10-CM | POA: Diagnosis not present

## 2019-03-23 DIAGNOSIS — J449 Chronic obstructive pulmonary disease, unspecified: Secondary | ICD-10-CM | POA: Diagnosis not present

## 2019-03-23 DIAGNOSIS — K219 Gastro-esophageal reflux disease without esophagitis: Secondary | ICD-10-CM | POA: Diagnosis not present

## 2019-03-23 DIAGNOSIS — I10 Essential (primary) hypertension: Secondary | ICD-10-CM | POA: Diagnosis not present

## 2019-03-29 DIAGNOSIS — Z48812 Encounter for surgical aftercare following surgery on the circulatory system: Secondary | ICD-10-CM | POA: Diagnosis not present

## 2019-03-29 DIAGNOSIS — J449 Chronic obstructive pulmonary disease, unspecified: Secondary | ICD-10-CM | POA: Diagnosis not present

## 2019-03-29 DIAGNOSIS — I10 Essential (primary) hypertension: Secondary | ICD-10-CM | POA: Diagnosis not present

## 2019-03-29 DIAGNOSIS — F419 Anxiety disorder, unspecified: Secondary | ICD-10-CM | POA: Diagnosis not present

## 2019-03-29 DIAGNOSIS — E1151 Type 2 diabetes mellitus with diabetic peripheral angiopathy without gangrene: Secondary | ICD-10-CM | POA: Diagnosis not present

## 2019-03-29 DIAGNOSIS — E1140 Type 2 diabetes mellitus with diabetic neuropathy, unspecified: Secondary | ICD-10-CM | POA: Diagnosis not present

## 2019-03-31 DIAGNOSIS — Z48812 Encounter for surgical aftercare following surgery on the circulatory system: Secondary | ICD-10-CM | POA: Diagnosis not present

## 2019-03-31 DIAGNOSIS — F419 Anxiety disorder, unspecified: Secondary | ICD-10-CM | POA: Diagnosis not present

## 2019-03-31 DIAGNOSIS — E1151 Type 2 diabetes mellitus with diabetic peripheral angiopathy without gangrene: Secondary | ICD-10-CM | POA: Diagnosis not present

## 2019-03-31 DIAGNOSIS — E1140 Type 2 diabetes mellitus with diabetic neuropathy, unspecified: Secondary | ICD-10-CM | POA: Diagnosis not present

## 2019-03-31 DIAGNOSIS — I10 Essential (primary) hypertension: Secondary | ICD-10-CM | POA: Diagnosis not present

## 2019-03-31 DIAGNOSIS — J449 Chronic obstructive pulmonary disease, unspecified: Secondary | ICD-10-CM | POA: Diagnosis not present

## 2019-04-04 DIAGNOSIS — Z48812 Encounter for surgical aftercare following surgery on the circulatory system: Secondary | ICD-10-CM | POA: Diagnosis not present

## 2019-04-04 DIAGNOSIS — E1140 Type 2 diabetes mellitus with diabetic neuropathy, unspecified: Secondary | ICD-10-CM | POA: Diagnosis not present

## 2019-04-04 DIAGNOSIS — E1151 Type 2 diabetes mellitus with diabetic peripheral angiopathy without gangrene: Secondary | ICD-10-CM | POA: Diagnosis not present

## 2019-04-04 DIAGNOSIS — I10 Essential (primary) hypertension: Secondary | ICD-10-CM | POA: Diagnosis not present

## 2019-04-04 DIAGNOSIS — F419 Anxiety disorder, unspecified: Secondary | ICD-10-CM | POA: Diagnosis not present

## 2019-04-04 DIAGNOSIS — J449 Chronic obstructive pulmonary disease, unspecified: Secondary | ICD-10-CM | POA: Diagnosis not present

## 2019-04-05 DIAGNOSIS — Z48812 Encounter for surgical aftercare following surgery on the circulatory system: Secondary | ICD-10-CM | POA: Diagnosis not present

## 2019-04-05 DIAGNOSIS — E1151 Type 2 diabetes mellitus with diabetic peripheral angiopathy without gangrene: Secondary | ICD-10-CM | POA: Diagnosis not present

## 2019-04-05 DIAGNOSIS — F419 Anxiety disorder, unspecified: Secondary | ICD-10-CM | POA: Diagnosis not present

## 2019-04-05 DIAGNOSIS — I10 Essential (primary) hypertension: Secondary | ICD-10-CM | POA: Diagnosis not present

## 2019-04-05 DIAGNOSIS — E1140 Type 2 diabetes mellitus with diabetic neuropathy, unspecified: Secondary | ICD-10-CM | POA: Diagnosis not present

## 2019-04-05 DIAGNOSIS — J449 Chronic obstructive pulmonary disease, unspecified: Secondary | ICD-10-CM | POA: Diagnosis not present

## 2019-04-11 DIAGNOSIS — E1151 Type 2 diabetes mellitus with diabetic peripheral angiopathy without gangrene: Secondary | ICD-10-CM | POA: Diagnosis not present

## 2019-04-11 DIAGNOSIS — J449 Chronic obstructive pulmonary disease, unspecified: Secondary | ICD-10-CM | POA: Diagnosis not present

## 2019-04-11 DIAGNOSIS — Z48812 Encounter for surgical aftercare following surgery on the circulatory system: Secondary | ICD-10-CM | POA: Diagnosis not present

## 2019-04-11 DIAGNOSIS — I10 Essential (primary) hypertension: Secondary | ICD-10-CM | POA: Diagnosis not present

## 2019-04-11 DIAGNOSIS — E1140 Type 2 diabetes mellitus with diabetic neuropathy, unspecified: Secondary | ICD-10-CM | POA: Diagnosis not present

## 2019-04-11 DIAGNOSIS — F419 Anxiety disorder, unspecified: Secondary | ICD-10-CM | POA: Diagnosis not present

## 2019-04-12 DIAGNOSIS — Z48812 Encounter for surgical aftercare following surgery on the circulatory system: Secondary | ICD-10-CM | POA: Diagnosis not present

## 2019-04-12 DIAGNOSIS — I10 Essential (primary) hypertension: Secondary | ICD-10-CM | POA: Diagnosis not present

## 2019-04-12 DIAGNOSIS — E1151 Type 2 diabetes mellitus with diabetic peripheral angiopathy without gangrene: Secondary | ICD-10-CM | POA: Diagnosis not present

## 2019-04-12 DIAGNOSIS — J449 Chronic obstructive pulmonary disease, unspecified: Secondary | ICD-10-CM | POA: Diagnosis not present

## 2019-04-12 DIAGNOSIS — F419 Anxiety disorder, unspecified: Secondary | ICD-10-CM | POA: Diagnosis not present

## 2019-04-12 DIAGNOSIS — E1140 Type 2 diabetes mellitus with diabetic neuropathy, unspecified: Secondary | ICD-10-CM | POA: Diagnosis not present

## 2019-04-13 DIAGNOSIS — E1151 Type 2 diabetes mellitus with diabetic peripheral angiopathy without gangrene: Secondary | ICD-10-CM | POA: Diagnosis not present

## 2019-04-13 DIAGNOSIS — Z48812 Encounter for surgical aftercare following surgery on the circulatory system: Secondary | ICD-10-CM | POA: Diagnosis not present

## 2019-04-13 DIAGNOSIS — J449 Chronic obstructive pulmonary disease, unspecified: Secondary | ICD-10-CM | POA: Diagnosis not present

## 2019-04-13 DIAGNOSIS — I10 Essential (primary) hypertension: Secondary | ICD-10-CM | POA: Diagnosis not present

## 2019-04-13 DIAGNOSIS — F419 Anxiety disorder, unspecified: Secondary | ICD-10-CM | POA: Diagnosis not present

## 2019-04-13 DIAGNOSIS — E1140 Type 2 diabetes mellitus with diabetic neuropathy, unspecified: Secondary | ICD-10-CM | POA: Diagnosis not present

## 2019-04-25 DIAGNOSIS — K219 Gastro-esophageal reflux disease without esophagitis: Secondary | ICD-10-CM | POA: Diagnosis not present

## 2019-04-25 DIAGNOSIS — E1151 Type 2 diabetes mellitus with diabetic peripheral angiopathy without gangrene: Secondary | ICD-10-CM | POA: Diagnosis not present

## 2019-04-25 DIAGNOSIS — F419 Anxiety disorder, unspecified: Secondary | ICD-10-CM | POA: Diagnosis not present

## 2019-04-25 DIAGNOSIS — Z48812 Encounter for surgical aftercare following surgery on the circulatory system: Secondary | ICD-10-CM | POA: Diagnosis not present

## 2019-04-25 DIAGNOSIS — E1140 Type 2 diabetes mellitus with diabetic neuropathy, unspecified: Secondary | ICD-10-CM | POA: Diagnosis not present

## 2019-04-25 DIAGNOSIS — J449 Chronic obstructive pulmonary disease, unspecified: Secondary | ICD-10-CM | POA: Diagnosis not present

## 2019-04-25 DIAGNOSIS — I10 Essential (primary) hypertension: Secondary | ICD-10-CM | POA: Diagnosis not present

## 2019-04-25 DIAGNOSIS — M1991 Primary osteoarthritis, unspecified site: Secondary | ICD-10-CM | POA: Diagnosis not present

## 2019-06-09 DIAGNOSIS — Z6830 Body mass index (BMI) 30.0-30.9, adult: Secondary | ICD-10-CM | POA: Diagnosis not present

## 2019-06-09 DIAGNOSIS — F419 Anxiety disorder, unspecified: Secondary | ICD-10-CM | POA: Diagnosis not present

## 2019-06-09 DIAGNOSIS — Z23 Encounter for immunization: Secondary | ICD-10-CM | POA: Diagnosis not present

## 2019-06-09 DIAGNOSIS — I739 Peripheral vascular disease, unspecified: Secondary | ICD-10-CM | POA: Diagnosis not present

## 2019-06-09 DIAGNOSIS — E1140 Type 2 diabetes mellitus with diabetic neuropathy, unspecified: Secondary | ICD-10-CM | POA: Diagnosis not present

## 2019-06-09 DIAGNOSIS — M47812 Spondylosis without myelopathy or radiculopathy, cervical region: Secondary | ICD-10-CM | POA: Diagnosis not present

## 2019-06-09 DIAGNOSIS — Z9114 Patient's other noncompliance with medication regimen: Secondary | ICD-10-CM | POA: Diagnosis not present

## 2019-06-09 DIAGNOSIS — I10 Essential (primary) hypertension: Secondary | ICD-10-CM | POA: Diagnosis not present

## 2019-06-14 DIAGNOSIS — T82898A Other specified complication of vascular prosthetic devices, implants and grafts, initial encounter: Secondary | ICD-10-CM | POA: Diagnosis not present

## 2019-06-14 DIAGNOSIS — M79673 Pain in unspecified foot: Secondary | ICD-10-CM | POA: Diagnosis not present

## 2019-06-14 DIAGNOSIS — T82868A Thrombosis of vascular prosthetic devices, implants and grafts, initial encounter: Secondary | ICD-10-CM | POA: Diagnosis not present

## 2019-06-14 DIAGNOSIS — I779 Disorder of arteries and arterioles, unspecified: Secondary | ICD-10-CM | POA: Diagnosis not present

## 2019-06-18 DIAGNOSIS — Z01818 Encounter for other preprocedural examination: Secondary | ICD-10-CM | POA: Diagnosis not present

## 2019-06-18 DIAGNOSIS — Z01812 Encounter for preprocedural laboratory examination: Secondary | ICD-10-CM | POA: Diagnosis not present

## 2019-06-20 DIAGNOSIS — I10 Essential (primary) hypertension: Secondary | ICD-10-CM | POA: Diagnosis not present

## 2019-06-20 DIAGNOSIS — R9431 Abnormal electrocardiogram [ECG] [EKG]: Secondary | ICD-10-CM | POA: Diagnosis not present

## 2019-06-20 DIAGNOSIS — Z01810 Encounter for preprocedural cardiovascular examination: Secondary | ICD-10-CM | POA: Diagnosis not present

## 2019-06-20 DIAGNOSIS — I779 Disorder of arteries and arterioles, unspecified: Secondary | ICD-10-CM | POA: Diagnosis not present

## 2019-06-20 DIAGNOSIS — Z95828 Presence of other vascular implants and grafts: Secondary | ICD-10-CM | POA: Diagnosis not present

## 2019-06-23 DIAGNOSIS — K760 Fatty (change of) liver, not elsewhere classified: Secondary | ICD-10-CM | POA: Diagnosis present

## 2019-06-23 DIAGNOSIS — I70322 Atherosclerosis of unspecified type of bypass graft(s) of the extremities with rest pain, left leg: Secondary | ICD-10-CM | POA: Diagnosis not present

## 2019-06-23 DIAGNOSIS — F329 Major depressive disorder, single episode, unspecified: Secondary | ICD-10-CM | POA: Diagnosis present

## 2019-06-23 DIAGNOSIS — E1151 Type 2 diabetes mellitus with diabetic peripheral angiopathy without gangrene: Secondary | ICD-10-CM | POA: Diagnosis not present

## 2019-06-23 DIAGNOSIS — I739 Peripheral vascular disease, unspecified: Secondary | ICD-10-CM | POA: Diagnosis not present

## 2019-06-23 DIAGNOSIS — G546 Phantom limb syndrome with pain: Secondary | ICD-10-CM | POA: Diagnosis not present

## 2019-06-23 DIAGNOSIS — K219 Gastro-esophageal reflux disease without esophagitis: Secondary | ICD-10-CM | POA: Diagnosis present

## 2019-06-23 DIAGNOSIS — I10 Essential (primary) hypertension: Secondary | ICD-10-CM | POA: Diagnosis not present

## 2019-06-23 DIAGNOSIS — Z022 Encounter for examination for admission to residential institution: Secondary | ICD-10-CM | POA: Diagnosis not present

## 2019-06-23 DIAGNOSIS — I959 Hypotension, unspecified: Secondary | ICD-10-CM | POA: Diagnosis not present

## 2019-06-23 DIAGNOSIS — F17210 Nicotine dependence, cigarettes, uncomplicated: Secondary | ICD-10-CM | POA: Diagnosis present

## 2019-06-23 DIAGNOSIS — I70222 Atherosclerosis of native arteries of extremities with rest pain, left leg: Secondary | ICD-10-CM | POA: Diagnosis not present

## 2019-06-23 DIAGNOSIS — Z86711 Personal history of pulmonary embolism: Secondary | ICD-10-CM | POA: Diagnosis not present

## 2019-06-23 DIAGNOSIS — N179 Acute kidney failure, unspecified: Secondary | ICD-10-CM | POA: Diagnosis not present

## 2019-06-23 DIAGNOSIS — E7800 Pure hypercholesterolemia, unspecified: Secondary | ICD-10-CM | POA: Diagnosis present

## 2019-06-23 DIAGNOSIS — F419 Anxiety disorder, unspecified: Secondary | ICD-10-CM | POA: Diagnosis present

## 2019-06-23 DIAGNOSIS — F418 Other specified anxiety disorders: Secondary | ICD-10-CM | POA: Diagnosis not present

## 2019-06-23 DIAGNOSIS — J449 Chronic obstructive pulmonary disease, unspecified: Secondary | ICD-10-CM | POA: Diagnosis not present

## 2019-06-23 DIAGNOSIS — E1142 Type 2 diabetes mellitus with diabetic polyneuropathy: Secondary | ICD-10-CM | POA: Diagnosis present

## 2019-06-23 DIAGNOSIS — Z7902 Long term (current) use of antithrombotics/antiplatelets: Secondary | ICD-10-CM | POA: Diagnosis not present

## 2019-06-23 DIAGNOSIS — D62 Acute posthemorrhagic anemia: Secondary | ICD-10-CM | POA: Diagnosis not present

## 2019-06-23 DIAGNOSIS — Z20828 Contact with and (suspected) exposure to other viral communicable diseases: Secondary | ICD-10-CM | POA: Diagnosis present

## 2019-07-03 DIAGNOSIS — Z89519 Acquired absence of unspecified leg below knee: Secondary | ICD-10-CM | POA: Diagnosis not present

## 2019-07-03 DIAGNOSIS — Z20828 Contact with and (suspected) exposure to other viral communicable diseases: Secondary | ICD-10-CM | POA: Diagnosis not present

## 2019-07-03 DIAGNOSIS — R531 Weakness: Secondary | ICD-10-CM | POA: Diagnosis not present

## 2019-07-03 DIAGNOSIS — Z720 Tobacco use: Secondary | ICD-10-CM | POA: Diagnosis not present

## 2019-07-03 DIAGNOSIS — I739 Peripheral vascular disease, unspecified: Secondary | ICD-10-CM | POA: Diagnosis not present

## 2019-07-04 DIAGNOSIS — Z89519 Acquired absence of unspecified leg below knee: Secondary | ICD-10-CM | POA: Diagnosis not present

## 2019-07-04 DIAGNOSIS — R531 Weakness: Secondary | ICD-10-CM | POA: Diagnosis not present

## 2019-07-04 DIAGNOSIS — Z720 Tobacco use: Secondary | ICD-10-CM | POA: Diagnosis not present

## 2019-07-04 DIAGNOSIS — I739 Peripheral vascular disease, unspecified: Secondary | ICD-10-CM | POA: Diagnosis not present

## 2019-07-05 DIAGNOSIS — L89150 Pressure ulcer of sacral region, unstageable: Secondary | ICD-10-CM | POA: Diagnosis not present

## 2019-07-05 DIAGNOSIS — R531 Weakness: Secondary | ICD-10-CM | POA: Diagnosis not present

## 2019-07-05 DIAGNOSIS — I739 Peripheral vascular disease, unspecified: Secondary | ICD-10-CM | POA: Diagnosis not present

## 2019-07-05 DIAGNOSIS — L97929 Non-pressure chronic ulcer of unspecified part of left lower leg with unspecified severity: Secondary | ICD-10-CM | POA: Diagnosis not present

## 2019-07-05 DIAGNOSIS — Z89519 Acquired absence of unspecified leg below knee: Secondary | ICD-10-CM | POA: Diagnosis not present

## 2019-07-06 DIAGNOSIS — I739 Peripheral vascular disease, unspecified: Secondary | ICD-10-CM | POA: Diagnosis not present

## 2019-07-06 DIAGNOSIS — Z89519 Acquired absence of unspecified leg below knee: Secondary | ICD-10-CM | POA: Diagnosis not present

## 2019-07-06 DIAGNOSIS — R531 Weakness: Secondary | ICD-10-CM | POA: Diagnosis not present

## 2019-07-22 DIAGNOSIS — Z9181 History of falling: Secondary | ICD-10-CM | POA: Diagnosis not present

## 2019-07-22 DIAGNOSIS — Z7902 Long term (current) use of antithrombotics/antiplatelets: Secondary | ICD-10-CM | POA: Diagnosis not present

## 2019-07-22 DIAGNOSIS — J449 Chronic obstructive pulmonary disease, unspecified: Secondary | ICD-10-CM | POA: Diagnosis not present

## 2019-07-22 DIAGNOSIS — Z7984 Long term (current) use of oral hypoglycemic drugs: Secondary | ICD-10-CM | POA: Diagnosis not present

## 2019-07-22 DIAGNOSIS — E1151 Type 2 diabetes mellitus with diabetic peripheral angiopathy without gangrene: Secondary | ICD-10-CM | POA: Diagnosis not present

## 2019-07-22 DIAGNOSIS — I10 Essential (primary) hypertension: Secondary | ICD-10-CM | POA: Diagnosis not present

## 2019-07-22 DIAGNOSIS — L89322 Pressure ulcer of left buttock, stage 2: Secondary | ICD-10-CM | POA: Diagnosis not present

## 2019-07-22 DIAGNOSIS — F17200 Nicotine dependence, unspecified, uncomplicated: Secondary | ICD-10-CM | POA: Diagnosis not present

## 2019-07-22 DIAGNOSIS — Z7982 Long term (current) use of aspirin: Secondary | ICD-10-CM | POA: Diagnosis not present

## 2019-07-22 DIAGNOSIS — Z89512 Acquired absence of left leg below knee: Secondary | ICD-10-CM | POA: Diagnosis not present

## 2019-07-22 DIAGNOSIS — L89312 Pressure ulcer of right buttock, stage 2: Secondary | ICD-10-CM | POA: Diagnosis not present

## 2019-07-22 DIAGNOSIS — Z4781 Encounter for orthopedic aftercare following surgical amputation: Secondary | ICD-10-CM | POA: Diagnosis not present

## 2019-07-22 DIAGNOSIS — L89152 Pressure ulcer of sacral region, stage 2: Secondary | ICD-10-CM | POA: Diagnosis not present

## 2019-07-24 DIAGNOSIS — I10 Essential (primary) hypertension: Secondary | ICD-10-CM | POA: Diagnosis not present

## 2019-07-24 DIAGNOSIS — E1151 Type 2 diabetes mellitus with diabetic peripheral angiopathy without gangrene: Secondary | ICD-10-CM | POA: Diagnosis not present

## 2019-07-24 DIAGNOSIS — L89322 Pressure ulcer of left buttock, stage 2: Secondary | ICD-10-CM | POA: Diagnosis not present

## 2019-07-24 DIAGNOSIS — Z4781 Encounter for orthopedic aftercare following surgical amputation: Secondary | ICD-10-CM | POA: Diagnosis not present

## 2019-07-24 DIAGNOSIS — L89312 Pressure ulcer of right buttock, stage 2: Secondary | ICD-10-CM | POA: Diagnosis not present

## 2019-07-24 DIAGNOSIS — L89152 Pressure ulcer of sacral region, stage 2: Secondary | ICD-10-CM | POA: Diagnosis not present

## 2019-08-03 DIAGNOSIS — Z4781 Encounter for orthopedic aftercare following surgical amputation: Secondary | ICD-10-CM | POA: Diagnosis not present

## 2019-08-03 DIAGNOSIS — I10 Essential (primary) hypertension: Secondary | ICD-10-CM | POA: Diagnosis not present

## 2019-08-03 DIAGNOSIS — L89152 Pressure ulcer of sacral region, stage 2: Secondary | ICD-10-CM | POA: Diagnosis not present

## 2019-08-03 DIAGNOSIS — L89322 Pressure ulcer of left buttock, stage 2: Secondary | ICD-10-CM | POA: Diagnosis not present

## 2019-08-03 DIAGNOSIS — L89312 Pressure ulcer of right buttock, stage 2: Secondary | ICD-10-CM | POA: Diagnosis not present

## 2019-08-03 DIAGNOSIS — E1151 Type 2 diabetes mellitus with diabetic peripheral angiopathy without gangrene: Secondary | ICD-10-CM | POA: Diagnosis not present

## 2019-08-04 DIAGNOSIS — I10 Essential (primary) hypertension: Secondary | ICD-10-CM | POA: Diagnosis not present

## 2019-08-04 DIAGNOSIS — E1151 Type 2 diabetes mellitus with diabetic peripheral angiopathy without gangrene: Secondary | ICD-10-CM | POA: Diagnosis not present

## 2019-08-04 DIAGNOSIS — L89152 Pressure ulcer of sacral region, stage 2: Secondary | ICD-10-CM | POA: Diagnosis not present

## 2019-08-04 DIAGNOSIS — L89322 Pressure ulcer of left buttock, stage 2: Secondary | ICD-10-CM | POA: Diagnosis not present

## 2019-08-04 DIAGNOSIS — L89312 Pressure ulcer of right buttock, stage 2: Secondary | ICD-10-CM | POA: Diagnosis not present

## 2019-08-04 DIAGNOSIS — Z4781 Encounter for orthopedic aftercare following surgical amputation: Secondary | ICD-10-CM | POA: Diagnosis not present

## 2019-08-07 DIAGNOSIS — L89152 Pressure ulcer of sacral region, stage 2: Secondary | ICD-10-CM | POA: Diagnosis not present

## 2019-08-07 DIAGNOSIS — L89322 Pressure ulcer of left buttock, stage 2: Secondary | ICD-10-CM | POA: Diagnosis not present

## 2019-08-07 DIAGNOSIS — I10 Essential (primary) hypertension: Secondary | ICD-10-CM | POA: Diagnosis not present

## 2019-08-07 DIAGNOSIS — E1151 Type 2 diabetes mellitus with diabetic peripheral angiopathy without gangrene: Secondary | ICD-10-CM | POA: Diagnosis not present

## 2019-08-07 DIAGNOSIS — Z4781 Encounter for orthopedic aftercare following surgical amputation: Secondary | ICD-10-CM | POA: Diagnosis not present

## 2019-08-07 DIAGNOSIS — L89312 Pressure ulcer of right buttock, stage 2: Secondary | ICD-10-CM | POA: Diagnosis not present

## 2019-08-09 DIAGNOSIS — L89152 Pressure ulcer of sacral region, stage 2: Secondary | ICD-10-CM | POA: Diagnosis not present

## 2019-08-09 DIAGNOSIS — Z4781 Encounter for orthopedic aftercare following surgical amputation: Secondary | ICD-10-CM | POA: Diagnosis not present

## 2019-08-09 DIAGNOSIS — L89312 Pressure ulcer of right buttock, stage 2: Secondary | ICD-10-CM | POA: Diagnosis not present

## 2019-08-09 DIAGNOSIS — L89322 Pressure ulcer of left buttock, stage 2: Secondary | ICD-10-CM | POA: Diagnosis not present

## 2019-08-09 DIAGNOSIS — I10 Essential (primary) hypertension: Secondary | ICD-10-CM | POA: Diagnosis not present

## 2019-08-09 DIAGNOSIS — E1151 Type 2 diabetes mellitus with diabetic peripheral angiopathy without gangrene: Secondary | ICD-10-CM | POA: Diagnosis not present

## 2019-08-10 DIAGNOSIS — L89322 Pressure ulcer of left buttock, stage 2: Secondary | ICD-10-CM | POA: Diagnosis not present

## 2019-08-10 DIAGNOSIS — L89152 Pressure ulcer of sacral region, stage 2: Secondary | ICD-10-CM | POA: Diagnosis not present

## 2019-08-10 DIAGNOSIS — I10 Essential (primary) hypertension: Secondary | ICD-10-CM | POA: Diagnosis not present

## 2019-08-10 DIAGNOSIS — Z4781 Encounter for orthopedic aftercare following surgical amputation: Secondary | ICD-10-CM | POA: Diagnosis not present

## 2019-08-10 DIAGNOSIS — L89312 Pressure ulcer of right buttock, stage 2: Secondary | ICD-10-CM | POA: Diagnosis not present

## 2019-08-10 DIAGNOSIS — E1151 Type 2 diabetes mellitus with diabetic peripheral angiopathy without gangrene: Secondary | ICD-10-CM | POA: Diagnosis not present

## 2019-08-11 DIAGNOSIS — L89312 Pressure ulcer of right buttock, stage 2: Secondary | ICD-10-CM | POA: Diagnosis not present

## 2019-08-11 DIAGNOSIS — E1151 Type 2 diabetes mellitus with diabetic peripheral angiopathy without gangrene: Secondary | ICD-10-CM | POA: Diagnosis not present

## 2019-08-11 DIAGNOSIS — Z4781 Encounter for orthopedic aftercare following surgical amputation: Secondary | ICD-10-CM | POA: Diagnosis not present

## 2019-08-11 DIAGNOSIS — L89152 Pressure ulcer of sacral region, stage 2: Secondary | ICD-10-CM | POA: Diagnosis not present

## 2019-08-11 DIAGNOSIS — L89322 Pressure ulcer of left buttock, stage 2: Secondary | ICD-10-CM | POA: Diagnosis not present

## 2019-08-11 DIAGNOSIS — I10 Essential (primary) hypertension: Secondary | ICD-10-CM | POA: Diagnosis not present

## 2019-08-14 DIAGNOSIS — L89152 Pressure ulcer of sacral region, stage 2: Secondary | ICD-10-CM | POA: Diagnosis not present

## 2019-08-14 DIAGNOSIS — L89322 Pressure ulcer of left buttock, stage 2: Secondary | ICD-10-CM | POA: Diagnosis not present

## 2019-08-14 DIAGNOSIS — I10 Essential (primary) hypertension: Secondary | ICD-10-CM | POA: Diagnosis not present

## 2019-08-14 DIAGNOSIS — Z4781 Encounter for orthopedic aftercare following surgical amputation: Secondary | ICD-10-CM | POA: Diagnosis not present

## 2019-08-14 DIAGNOSIS — E1151 Type 2 diabetes mellitus with diabetic peripheral angiopathy without gangrene: Secondary | ICD-10-CM | POA: Diagnosis not present

## 2019-08-14 DIAGNOSIS — L89312 Pressure ulcer of right buttock, stage 2: Secondary | ICD-10-CM | POA: Diagnosis not present

## 2019-08-16 DIAGNOSIS — E1151 Type 2 diabetes mellitus with diabetic peripheral angiopathy without gangrene: Secondary | ICD-10-CM | POA: Diagnosis not present

## 2019-08-16 DIAGNOSIS — L89152 Pressure ulcer of sacral region, stage 2: Secondary | ICD-10-CM | POA: Diagnosis not present

## 2019-08-16 DIAGNOSIS — L89312 Pressure ulcer of right buttock, stage 2: Secondary | ICD-10-CM | POA: Diagnosis not present

## 2019-08-16 DIAGNOSIS — Z4781 Encounter for orthopedic aftercare following surgical amputation: Secondary | ICD-10-CM | POA: Diagnosis not present

## 2019-08-16 DIAGNOSIS — I10 Essential (primary) hypertension: Secondary | ICD-10-CM | POA: Diagnosis not present

## 2019-08-16 DIAGNOSIS — L89322 Pressure ulcer of left buttock, stage 2: Secondary | ICD-10-CM | POA: Diagnosis not present

## 2019-08-18 DIAGNOSIS — L89312 Pressure ulcer of right buttock, stage 2: Secondary | ICD-10-CM | POA: Diagnosis not present

## 2019-08-18 DIAGNOSIS — I10 Essential (primary) hypertension: Secondary | ICD-10-CM | POA: Diagnosis not present

## 2019-08-18 DIAGNOSIS — Z4781 Encounter for orthopedic aftercare following surgical amputation: Secondary | ICD-10-CM | POA: Diagnosis not present

## 2019-08-18 DIAGNOSIS — L89152 Pressure ulcer of sacral region, stage 2: Secondary | ICD-10-CM | POA: Diagnosis not present

## 2019-08-18 DIAGNOSIS — E1151 Type 2 diabetes mellitus with diabetic peripheral angiopathy without gangrene: Secondary | ICD-10-CM | POA: Diagnosis not present

## 2019-08-18 DIAGNOSIS — L89322 Pressure ulcer of left buttock, stage 2: Secondary | ICD-10-CM | POA: Diagnosis not present

## 2019-08-19 DIAGNOSIS — L89312 Pressure ulcer of right buttock, stage 2: Secondary | ICD-10-CM | POA: Diagnosis not present

## 2019-08-19 DIAGNOSIS — Z4781 Encounter for orthopedic aftercare following surgical amputation: Secondary | ICD-10-CM | POA: Diagnosis not present

## 2019-08-19 DIAGNOSIS — I10 Essential (primary) hypertension: Secondary | ICD-10-CM | POA: Diagnosis not present

## 2019-08-19 DIAGNOSIS — L89322 Pressure ulcer of left buttock, stage 2: Secondary | ICD-10-CM | POA: Diagnosis not present

## 2019-08-19 DIAGNOSIS — L89152 Pressure ulcer of sacral region, stage 2: Secondary | ICD-10-CM | POA: Diagnosis not present

## 2019-08-19 DIAGNOSIS — E1151 Type 2 diabetes mellitus with diabetic peripheral angiopathy without gangrene: Secondary | ICD-10-CM | POA: Diagnosis not present

## 2019-08-21 DIAGNOSIS — Z7902 Long term (current) use of antithrombotics/antiplatelets: Secondary | ICD-10-CM | POA: Diagnosis not present

## 2019-08-21 DIAGNOSIS — Z7982 Long term (current) use of aspirin: Secondary | ICD-10-CM | POA: Diagnosis not present

## 2019-08-21 DIAGNOSIS — E1151 Type 2 diabetes mellitus with diabetic peripheral angiopathy without gangrene: Secondary | ICD-10-CM | POA: Diagnosis not present

## 2019-08-21 DIAGNOSIS — Z7984 Long term (current) use of oral hypoglycemic drugs: Secondary | ICD-10-CM | POA: Diagnosis not present

## 2019-08-21 DIAGNOSIS — Z89512 Acquired absence of left leg below knee: Secondary | ICD-10-CM | POA: Diagnosis not present

## 2019-08-21 DIAGNOSIS — L89322 Pressure ulcer of left buttock, stage 2: Secondary | ICD-10-CM | POA: Diagnosis not present

## 2019-08-21 DIAGNOSIS — L89312 Pressure ulcer of right buttock, stage 2: Secondary | ICD-10-CM | POA: Diagnosis not present

## 2019-08-21 DIAGNOSIS — Z4801 Encounter for change or removal of surgical wound dressing: Secondary | ICD-10-CM | POA: Diagnosis not present

## 2019-08-21 DIAGNOSIS — Z4781 Encounter for orthopedic aftercare following surgical amputation: Secondary | ICD-10-CM | POA: Diagnosis not present

## 2019-08-21 DIAGNOSIS — F330 Major depressive disorder, recurrent, mild: Secondary | ICD-10-CM | POA: Diagnosis not present

## 2019-08-21 DIAGNOSIS — I739 Peripheral vascular disease, unspecified: Secondary | ICD-10-CM | POA: Diagnosis not present

## 2019-08-21 DIAGNOSIS — D500 Iron deficiency anemia secondary to blood loss (chronic): Secondary | ICD-10-CM | POA: Diagnosis not present

## 2019-08-21 DIAGNOSIS — Z9181 History of falling: Secondary | ICD-10-CM | POA: Diagnosis not present

## 2019-08-21 DIAGNOSIS — J449 Chronic obstructive pulmonary disease, unspecified: Secondary | ICD-10-CM | POA: Diagnosis not present

## 2019-08-21 DIAGNOSIS — F17200 Nicotine dependence, unspecified, uncomplicated: Secondary | ICD-10-CM | POA: Diagnosis not present

## 2019-08-21 DIAGNOSIS — I10 Essential (primary) hypertension: Secondary | ICD-10-CM | POA: Diagnosis not present

## 2019-08-21 DIAGNOSIS — L89152 Pressure ulcer of sacral region, stage 2: Secondary | ICD-10-CM | POA: Diagnosis not present

## 2019-08-24 DIAGNOSIS — L89152 Pressure ulcer of sacral region, stage 2: Secondary | ICD-10-CM | POA: Diagnosis not present

## 2019-08-24 DIAGNOSIS — L89322 Pressure ulcer of left buttock, stage 2: Secondary | ICD-10-CM | POA: Diagnosis not present

## 2019-08-24 DIAGNOSIS — L89312 Pressure ulcer of right buttock, stage 2: Secondary | ICD-10-CM | POA: Diagnosis not present

## 2019-08-24 DIAGNOSIS — E1151 Type 2 diabetes mellitus with diabetic peripheral angiopathy without gangrene: Secondary | ICD-10-CM | POA: Diagnosis not present

## 2019-08-24 DIAGNOSIS — D500 Iron deficiency anemia secondary to blood loss (chronic): Secondary | ICD-10-CM | POA: Diagnosis not present

## 2019-08-24 DIAGNOSIS — Z4781 Encounter for orthopedic aftercare following surgical amputation: Secondary | ICD-10-CM | POA: Diagnosis not present

## 2019-08-25 DIAGNOSIS — J449 Chronic obstructive pulmonary disease, unspecified: Secondary | ICD-10-CM | POA: Diagnosis present

## 2019-08-25 DIAGNOSIS — I129 Hypertensive chronic kidney disease with stage 1 through stage 4 chronic kidney disease, or unspecified chronic kidney disease: Secondary | ICD-10-CM | POA: Diagnosis not present

## 2019-08-25 DIAGNOSIS — I739 Peripheral vascular disease, unspecified: Secondary | ICD-10-CM | POA: Diagnosis not present

## 2019-08-25 DIAGNOSIS — D62 Acute posthemorrhagic anemia: Secondary | ICD-10-CM | POA: Diagnosis not present

## 2019-08-25 DIAGNOSIS — G92 Toxic encephalopathy: Secondary | ICD-10-CM | POA: Diagnosis not present

## 2019-08-25 DIAGNOSIS — M86152 Other acute osteomyelitis, left femur: Secondary | ICD-10-CM | POA: Diagnosis not present

## 2019-08-25 DIAGNOSIS — E8770 Fluid overload, unspecified: Secondary | ICD-10-CM | POA: Diagnosis not present

## 2019-08-25 DIAGNOSIS — L89312 Pressure ulcer of right buttock, stage 2: Secondary | ICD-10-CM | POA: Diagnosis not present

## 2019-08-25 DIAGNOSIS — Z0000 Encounter for general adult medical examination without abnormal findings: Secondary | ICD-10-CM | POA: Diagnosis not present

## 2019-08-25 DIAGNOSIS — A419 Sepsis, unspecified organism: Secondary | ICD-10-CM | POA: Diagnosis not present

## 2019-08-25 DIAGNOSIS — Z86711 Personal history of pulmonary embolism: Secondary | ICD-10-CM | POA: Diagnosis not present

## 2019-08-25 DIAGNOSIS — Z7902 Long term (current) use of antithrombotics/antiplatelets: Secondary | ICD-10-CM | POA: Diagnosis not present

## 2019-08-25 DIAGNOSIS — I2510 Atherosclerotic heart disease of native coronary artery without angina pectoris: Secondary | ICD-10-CM | POA: Diagnosis present

## 2019-08-25 DIAGNOSIS — D500 Iron deficiency anemia secondary to blood loss (chronic): Secondary | ICD-10-CM | POA: Diagnosis not present

## 2019-08-25 DIAGNOSIS — T8740 Infection of amputation stump, unspecified extremity: Secondary | ICD-10-CM | POA: Diagnosis not present

## 2019-08-25 DIAGNOSIS — T426X5A Adverse effect of other antiepileptic and sedative-hypnotic drugs, initial encounter: Secondary | ICD-10-CM | POA: Diagnosis not present

## 2019-08-25 DIAGNOSIS — E1122 Type 2 diabetes mellitus with diabetic chronic kidney disease: Secondary | ICD-10-CM | POA: Diagnosis not present

## 2019-08-25 DIAGNOSIS — Z9889 Other specified postprocedural states: Secondary | ICD-10-CM | POA: Diagnosis not present

## 2019-08-25 DIAGNOSIS — L97129 Non-pressure chronic ulcer of left thigh with unspecified severity: Secondary | ICD-10-CM | POA: Diagnosis not present

## 2019-08-25 DIAGNOSIS — I10 Essential (primary) hypertension: Secondary | ICD-10-CM | POA: Diagnosis present

## 2019-08-25 DIAGNOSIS — T879 Unspecified complications of amputation stump: Secondary | ICD-10-CM | POA: Diagnosis not present

## 2019-08-25 DIAGNOSIS — L89322 Pressure ulcer of left buttock, stage 2: Secondary | ICD-10-CM | POA: Diagnosis not present

## 2019-08-25 DIAGNOSIS — E785 Hyperlipidemia, unspecified: Secondary | ICD-10-CM | POA: Diagnosis present

## 2019-08-25 DIAGNOSIS — I358 Other nonrheumatic aortic valve disorders: Secondary | ICD-10-CM | POA: Diagnosis not present

## 2019-08-25 DIAGNOSIS — I70202 Unspecified atherosclerosis of native arteries of extremities, left leg: Secondary | ICD-10-CM | POA: Diagnosis present

## 2019-08-25 DIAGNOSIS — J810 Acute pulmonary edema: Secondary | ICD-10-CM | POA: Diagnosis not present

## 2019-08-25 DIAGNOSIS — Z4781 Encounter for orthopedic aftercare following surgical amputation: Secondary | ICD-10-CM | POA: Diagnosis not present

## 2019-08-25 DIAGNOSIS — I499 Cardiac arrhythmia, unspecified: Secondary | ICD-10-CM | POA: Diagnosis not present

## 2019-08-25 DIAGNOSIS — R6521 Severe sepsis with septic shock: Secondary | ICD-10-CM | POA: Diagnosis not present

## 2019-08-25 DIAGNOSIS — E7800 Pure hypercholesterolemia, unspecified: Secondary | ICD-10-CM | POA: Diagnosis present

## 2019-08-25 DIAGNOSIS — T8140XA Infection following a procedure, unspecified, initial encounter: Secondary | ICD-10-CM | POA: Diagnosis not present

## 2019-08-25 DIAGNOSIS — N189 Chronic kidney disease, unspecified: Secondary | ICD-10-CM | POA: Diagnosis not present

## 2019-08-25 DIAGNOSIS — T8744 Infection of amputation stump, left lower extremity: Secondary | ICD-10-CM | POA: Diagnosis not present

## 2019-08-25 DIAGNOSIS — Z794 Long term (current) use of insulin: Secondary | ICD-10-CM | POA: Diagnosis not present

## 2019-08-25 DIAGNOSIS — E1165 Type 2 diabetes mellitus with hyperglycemia: Secondary | ICD-10-CM | POA: Diagnosis not present

## 2019-08-25 DIAGNOSIS — R7989 Other specified abnormal findings of blood chemistry: Secondary | ICD-10-CM | POA: Diagnosis not present

## 2019-08-25 DIAGNOSIS — I959 Hypotension, unspecified: Secondary | ICD-10-CM | POA: Diagnosis not present

## 2019-08-25 DIAGNOSIS — E1142 Type 2 diabetes mellitus with diabetic polyneuropathy: Secondary | ICD-10-CM | POA: Diagnosis present

## 2019-08-25 DIAGNOSIS — R571 Hypovolemic shock: Secondary | ICD-10-CM | POA: Diagnosis not present

## 2019-08-25 DIAGNOSIS — D638 Anemia in other chronic diseases classified elsewhere: Secondary | ICD-10-CM | POA: Diagnosis not present

## 2019-08-25 DIAGNOSIS — G9340 Encephalopathy, unspecified: Secondary | ICD-10-CM | POA: Diagnosis not present

## 2019-08-25 DIAGNOSIS — E1151 Type 2 diabetes mellitus with diabetic peripheral angiopathy without gangrene: Secondary | ICD-10-CM | POA: Diagnosis present

## 2019-08-25 DIAGNOSIS — L89152 Pressure ulcer of sacral region, stage 2: Secondary | ICD-10-CM | POA: Diagnosis not present

## 2019-08-25 DIAGNOSIS — F17210 Nicotine dependence, cigarettes, uncomplicated: Secondary | ICD-10-CM | POA: Diagnosis present

## 2019-08-25 DIAGNOSIS — R000 Tachycardia, unspecified: Secondary | ICD-10-CM | POA: Diagnosis not present

## 2019-08-25 DIAGNOSIS — J9601 Acute respiratory failure with hypoxia: Secondary | ICD-10-CM | POA: Diagnosis not present

## 2019-08-26 DIAGNOSIS — L97129 Non-pressure chronic ulcer of left thigh with unspecified severity: Secondary | ICD-10-CM | POA: Diagnosis not present

## 2019-08-26 DIAGNOSIS — T8744 Infection of amputation stump, left lower extremity: Secondary | ICD-10-CM | POA: Diagnosis not present

## 2019-08-26 DIAGNOSIS — I129 Hypertensive chronic kidney disease with stage 1 through stage 4 chronic kidney disease, or unspecified chronic kidney disease: Secondary | ICD-10-CM | POA: Diagnosis not present

## 2019-08-26 DIAGNOSIS — T879 Unspecified complications of amputation stump: Secondary | ICD-10-CM | POA: Diagnosis not present

## 2019-08-26 DIAGNOSIS — I739 Peripheral vascular disease, unspecified: Secondary | ICD-10-CM | POA: Diagnosis not present

## 2019-08-26 DIAGNOSIS — E1122 Type 2 diabetes mellitus with diabetic chronic kidney disease: Secondary | ICD-10-CM | POA: Diagnosis not present

## 2019-08-26 DIAGNOSIS — N189 Chronic kidney disease, unspecified: Secondary | ICD-10-CM | POA: Diagnosis not present

## 2019-08-26 DIAGNOSIS — M86152 Other acute osteomyelitis, left femur: Secondary | ICD-10-CM | POA: Diagnosis not present

## 2019-08-27 DIAGNOSIS — J810 Acute pulmonary edema: Secondary | ICD-10-CM | POA: Diagnosis not present

## 2019-08-27 DIAGNOSIS — I358 Other nonrheumatic aortic valve disorders: Secondary | ICD-10-CM | POA: Diagnosis not present

## 2019-08-27 DIAGNOSIS — I959 Hypotension, unspecified: Secondary | ICD-10-CM | POA: Diagnosis not present

## 2019-08-27 DIAGNOSIS — G9340 Encephalopathy, unspecified: Secondary | ICD-10-CM | POA: Diagnosis not present

## 2019-08-27 DIAGNOSIS — D62 Acute posthemorrhagic anemia: Secondary | ICD-10-CM | POA: Diagnosis not present

## 2019-08-27 DIAGNOSIS — J9601 Acute respiratory failure with hypoxia: Secondary | ICD-10-CM | POA: Diagnosis not present

## 2019-08-27 DIAGNOSIS — R000 Tachycardia, unspecified: Secondary | ICD-10-CM | POA: Diagnosis not present

## 2019-08-28 DIAGNOSIS — D638 Anemia in other chronic diseases classified elsewhere: Secondary | ICD-10-CM | POA: Diagnosis not present

## 2019-08-28 DIAGNOSIS — D62 Acute posthemorrhagic anemia: Secondary | ICD-10-CM | POA: Diagnosis not present

## 2019-08-28 DIAGNOSIS — Z794 Long term (current) use of insulin: Secondary | ICD-10-CM | POA: Diagnosis not present

## 2019-08-28 DIAGNOSIS — J810 Acute pulmonary edema: Secondary | ICD-10-CM | POA: Diagnosis not present

## 2019-08-28 DIAGNOSIS — G9340 Encephalopathy, unspecified: Secondary | ICD-10-CM | POA: Diagnosis not present

## 2019-08-28 DIAGNOSIS — E1151 Type 2 diabetes mellitus with diabetic peripheral angiopathy without gangrene: Secondary | ICD-10-CM | POA: Diagnosis not present

## 2019-08-28 DIAGNOSIS — R7989 Other specified abnormal findings of blood chemistry: Secondary | ICD-10-CM | POA: Diagnosis not present

## 2019-08-29 DIAGNOSIS — J810 Acute pulmonary edema: Secondary | ICD-10-CM | POA: Diagnosis not present

## 2019-08-29 DIAGNOSIS — D62 Acute posthemorrhagic anemia: Secondary | ICD-10-CM | POA: Diagnosis not present

## 2019-08-29 DIAGNOSIS — G9340 Encephalopathy, unspecified: Secondary | ICD-10-CM | POA: Diagnosis not present

## 2019-08-29 DIAGNOSIS — D638 Anemia in other chronic diseases classified elsewhere: Secondary | ICD-10-CM | POA: Diagnosis not present

## 2019-08-29 DIAGNOSIS — R7989 Other specified abnormal findings of blood chemistry: Secondary | ICD-10-CM | POA: Diagnosis not present

## 2019-08-29 DIAGNOSIS — Z794 Long term (current) use of insulin: Secondary | ICD-10-CM | POA: Diagnosis not present

## 2019-08-29 DIAGNOSIS — E1151 Type 2 diabetes mellitus with diabetic peripheral angiopathy without gangrene: Secondary | ICD-10-CM | POA: Diagnosis not present

## 2019-08-31 DIAGNOSIS — E1151 Type 2 diabetes mellitus with diabetic peripheral angiopathy without gangrene: Secondary | ICD-10-CM | POA: Diagnosis not present

## 2019-08-31 DIAGNOSIS — Z4781 Encounter for orthopedic aftercare following surgical amputation: Secondary | ICD-10-CM | POA: Diagnosis not present

## 2019-08-31 DIAGNOSIS — L89322 Pressure ulcer of left buttock, stage 2: Secondary | ICD-10-CM | POA: Diagnosis not present

## 2019-08-31 DIAGNOSIS — L89312 Pressure ulcer of right buttock, stage 2: Secondary | ICD-10-CM | POA: Diagnosis not present

## 2019-08-31 DIAGNOSIS — D500 Iron deficiency anemia secondary to blood loss (chronic): Secondary | ICD-10-CM | POA: Diagnosis not present

## 2019-08-31 DIAGNOSIS — L89152 Pressure ulcer of sacral region, stage 2: Secondary | ICD-10-CM | POA: Diagnosis not present

## 2019-09-01 DIAGNOSIS — L89152 Pressure ulcer of sacral region, stage 2: Secondary | ICD-10-CM | POA: Diagnosis not present

## 2019-09-01 DIAGNOSIS — Z4781 Encounter for orthopedic aftercare following surgical amputation: Secondary | ICD-10-CM | POA: Diagnosis not present

## 2019-09-01 DIAGNOSIS — D500 Iron deficiency anemia secondary to blood loss (chronic): Secondary | ICD-10-CM | POA: Diagnosis not present

## 2019-09-01 DIAGNOSIS — E1151 Type 2 diabetes mellitus with diabetic peripheral angiopathy without gangrene: Secondary | ICD-10-CM | POA: Diagnosis not present

## 2019-09-01 DIAGNOSIS — L89312 Pressure ulcer of right buttock, stage 2: Secondary | ICD-10-CM | POA: Diagnosis not present

## 2019-09-01 DIAGNOSIS — L89322 Pressure ulcer of left buttock, stage 2: Secondary | ICD-10-CM | POA: Diagnosis not present

## 2019-09-05 DIAGNOSIS — L89322 Pressure ulcer of left buttock, stage 2: Secondary | ICD-10-CM | POA: Diagnosis not present

## 2019-09-05 DIAGNOSIS — Z4781 Encounter for orthopedic aftercare following surgical amputation: Secondary | ICD-10-CM | POA: Diagnosis not present

## 2019-09-05 DIAGNOSIS — E1151 Type 2 diabetes mellitus with diabetic peripheral angiopathy without gangrene: Secondary | ICD-10-CM | POA: Diagnosis not present

## 2019-09-05 DIAGNOSIS — D500 Iron deficiency anemia secondary to blood loss (chronic): Secondary | ICD-10-CM | POA: Diagnosis not present

## 2019-09-05 DIAGNOSIS — L89152 Pressure ulcer of sacral region, stage 2: Secondary | ICD-10-CM | POA: Diagnosis not present

## 2019-09-05 DIAGNOSIS — L89312 Pressure ulcer of right buttock, stage 2: Secondary | ICD-10-CM | POA: Diagnosis not present

## 2019-09-15 DIAGNOSIS — Z720 Tobacco use: Secondary | ICD-10-CM | POA: Diagnosis not present

## 2019-09-15 DIAGNOSIS — D500 Iron deficiency anemia secondary to blood loss (chronic): Secondary | ICD-10-CM | POA: Diagnosis not present

## 2019-09-15 DIAGNOSIS — K219 Gastro-esophageal reflux disease without esophagitis: Secondary | ICD-10-CM | POA: Diagnosis not present

## 2019-09-15 DIAGNOSIS — E1140 Type 2 diabetes mellitus with diabetic neuropathy, unspecified: Secondary | ICD-10-CM | POA: Diagnosis not present

## 2019-09-15 DIAGNOSIS — L89152 Pressure ulcer of sacral region, stage 2: Secondary | ICD-10-CM | POA: Diagnosis not present

## 2019-09-15 DIAGNOSIS — E1151 Type 2 diabetes mellitus with diabetic peripheral angiopathy without gangrene: Secondary | ICD-10-CM | POA: Diagnosis not present

## 2019-09-15 DIAGNOSIS — I10 Essential (primary) hypertension: Secondary | ICD-10-CM | POA: Diagnosis not present

## 2019-09-15 DIAGNOSIS — I739 Peripheral vascular disease, unspecified: Secondary | ICD-10-CM | POA: Diagnosis not present

## 2019-09-15 DIAGNOSIS — L89322 Pressure ulcer of left buttock, stage 2: Secondary | ICD-10-CM | POA: Diagnosis not present

## 2019-09-15 DIAGNOSIS — F419 Anxiety disorder, unspecified: Secondary | ICD-10-CM | POA: Diagnosis not present

## 2019-09-15 DIAGNOSIS — M47812 Spondylosis without myelopathy or radiculopathy, cervical region: Secondary | ICD-10-CM | POA: Diagnosis not present

## 2019-09-15 DIAGNOSIS — L89312 Pressure ulcer of right buttock, stage 2: Secondary | ICD-10-CM | POA: Diagnosis not present

## 2019-09-15 DIAGNOSIS — Z4781 Encounter for orthopedic aftercare following surgical amputation: Secondary | ICD-10-CM | POA: Diagnosis not present

## 2019-09-15 DIAGNOSIS — Z9114 Patient's other noncompliance with medication regimen: Secondary | ICD-10-CM | POA: Diagnosis not present

## 2019-09-18 DIAGNOSIS — Z4781 Encounter for orthopedic aftercare following surgical amputation: Secondary | ICD-10-CM | POA: Diagnosis not present

## 2019-09-18 DIAGNOSIS — D500 Iron deficiency anemia secondary to blood loss (chronic): Secondary | ICD-10-CM | POA: Diagnosis not present

## 2019-09-18 DIAGNOSIS — L89152 Pressure ulcer of sacral region, stage 2: Secondary | ICD-10-CM | POA: Diagnosis not present

## 2019-09-18 DIAGNOSIS — L89322 Pressure ulcer of left buttock, stage 2: Secondary | ICD-10-CM | POA: Diagnosis not present

## 2019-09-18 DIAGNOSIS — E1151 Type 2 diabetes mellitus with diabetic peripheral angiopathy without gangrene: Secondary | ICD-10-CM | POA: Diagnosis not present

## 2019-09-18 DIAGNOSIS — L89312 Pressure ulcer of right buttock, stage 2: Secondary | ICD-10-CM | POA: Diagnosis not present

## 2019-09-20 DIAGNOSIS — I2510 Atherosclerotic heart disease of native coronary artery without angina pectoris: Secondary | ICD-10-CM | POA: Diagnosis not present

## 2019-09-20 DIAGNOSIS — F17210 Nicotine dependence, cigarettes, uncomplicated: Secondary | ICD-10-CM | POA: Diagnosis not present

## 2019-09-20 DIAGNOSIS — J449 Chronic obstructive pulmonary disease, unspecified: Secondary | ICD-10-CM | POA: Diagnosis not present

## 2019-09-20 DIAGNOSIS — D500 Iron deficiency anemia secondary to blood loss (chronic): Secondary | ICD-10-CM | POA: Diagnosis not present

## 2019-09-20 DIAGNOSIS — E1140 Type 2 diabetes mellitus with diabetic neuropathy, unspecified: Secondary | ICD-10-CM | POA: Diagnosis not present

## 2019-09-20 DIAGNOSIS — Z993 Dependence on wheelchair: Secondary | ICD-10-CM | POA: Diagnosis not present

## 2019-09-20 DIAGNOSIS — E1151 Type 2 diabetes mellitus with diabetic peripheral angiopathy without gangrene: Secondary | ICD-10-CM | POA: Diagnosis not present

## 2019-09-20 DIAGNOSIS — Z89612 Acquired absence of left leg above knee: Secondary | ICD-10-CM | POA: Diagnosis not present

## 2019-09-20 DIAGNOSIS — F330 Major depressive disorder, recurrent, mild: Secondary | ICD-10-CM | POA: Diagnosis not present

## 2019-09-20 DIAGNOSIS — Z4781 Encounter for orthopedic aftercare following surgical amputation: Secondary | ICD-10-CM | POA: Diagnosis not present

## 2019-09-20 DIAGNOSIS — I10 Essential (primary) hypertension: Secondary | ICD-10-CM | POA: Diagnosis not present

## 2019-09-20 DIAGNOSIS — Z9181 History of falling: Secondary | ICD-10-CM | POA: Diagnosis not present

## 2019-09-20 DIAGNOSIS — Z794 Long term (current) use of insulin: Secondary | ICD-10-CM | POA: Diagnosis not present

## 2019-09-25 DIAGNOSIS — J449 Chronic obstructive pulmonary disease, unspecified: Secondary | ICD-10-CM | POA: Diagnosis not present

## 2019-09-25 DIAGNOSIS — Z4781 Encounter for orthopedic aftercare following surgical amputation: Secondary | ICD-10-CM | POA: Diagnosis not present

## 2019-09-25 DIAGNOSIS — I10 Essential (primary) hypertension: Secondary | ICD-10-CM | POA: Diagnosis not present

## 2019-09-25 DIAGNOSIS — D500 Iron deficiency anemia secondary to blood loss (chronic): Secondary | ICD-10-CM | POA: Diagnosis not present

## 2019-09-25 DIAGNOSIS — E1151 Type 2 diabetes mellitus with diabetic peripheral angiopathy without gangrene: Secondary | ICD-10-CM | POA: Diagnosis not present

## 2019-09-25 DIAGNOSIS — I2510 Atherosclerotic heart disease of native coronary artery without angina pectoris: Secondary | ICD-10-CM | POA: Diagnosis not present

## 2019-10-02 DIAGNOSIS — I2510 Atherosclerotic heart disease of native coronary artery without angina pectoris: Secondary | ICD-10-CM | POA: Diagnosis not present

## 2019-10-02 DIAGNOSIS — D500 Iron deficiency anemia secondary to blood loss (chronic): Secondary | ICD-10-CM | POA: Diagnosis not present

## 2019-10-02 DIAGNOSIS — I10 Essential (primary) hypertension: Secondary | ICD-10-CM | POA: Diagnosis not present

## 2019-10-02 DIAGNOSIS — Z4781 Encounter for orthopedic aftercare following surgical amputation: Secondary | ICD-10-CM | POA: Diagnosis not present

## 2019-10-02 DIAGNOSIS — J449 Chronic obstructive pulmonary disease, unspecified: Secondary | ICD-10-CM | POA: Diagnosis not present

## 2019-10-02 DIAGNOSIS — E1151 Type 2 diabetes mellitus with diabetic peripheral angiopathy without gangrene: Secondary | ICD-10-CM | POA: Diagnosis not present

## 2019-10-09 DIAGNOSIS — E1151 Type 2 diabetes mellitus with diabetic peripheral angiopathy without gangrene: Secondary | ICD-10-CM | POA: Diagnosis not present

## 2019-10-09 DIAGNOSIS — I2510 Atherosclerotic heart disease of native coronary artery without angina pectoris: Secondary | ICD-10-CM | POA: Diagnosis not present

## 2019-10-09 DIAGNOSIS — Z4781 Encounter for orthopedic aftercare following surgical amputation: Secondary | ICD-10-CM | POA: Diagnosis not present

## 2019-10-09 DIAGNOSIS — I10 Essential (primary) hypertension: Secondary | ICD-10-CM | POA: Diagnosis not present

## 2019-10-09 DIAGNOSIS — D500 Iron deficiency anemia secondary to blood loss (chronic): Secondary | ICD-10-CM | POA: Diagnosis not present

## 2019-10-09 DIAGNOSIS — J449 Chronic obstructive pulmonary disease, unspecified: Secondary | ICD-10-CM | POA: Diagnosis not present

## 2019-10-16 DIAGNOSIS — J449 Chronic obstructive pulmonary disease, unspecified: Secondary | ICD-10-CM | POA: Diagnosis not present

## 2019-10-16 DIAGNOSIS — Z4781 Encounter for orthopedic aftercare following surgical amputation: Secondary | ICD-10-CM | POA: Diagnosis not present

## 2019-10-16 DIAGNOSIS — I10 Essential (primary) hypertension: Secondary | ICD-10-CM | POA: Diagnosis not present

## 2019-10-16 DIAGNOSIS — E1151 Type 2 diabetes mellitus with diabetic peripheral angiopathy without gangrene: Secondary | ICD-10-CM | POA: Diagnosis not present

## 2019-10-16 DIAGNOSIS — D500 Iron deficiency anemia secondary to blood loss (chronic): Secondary | ICD-10-CM | POA: Diagnosis not present

## 2019-10-16 DIAGNOSIS — I2510 Atherosclerotic heart disease of native coronary artery without angina pectoris: Secondary | ICD-10-CM | POA: Diagnosis not present

## 2019-10-20 DIAGNOSIS — E1151 Type 2 diabetes mellitus with diabetic peripheral angiopathy without gangrene: Secondary | ICD-10-CM | POA: Diagnosis not present

## 2019-10-20 DIAGNOSIS — Z9181 History of falling: Secondary | ICD-10-CM | POA: Diagnosis not present

## 2019-10-20 DIAGNOSIS — E1140 Type 2 diabetes mellitus with diabetic neuropathy, unspecified: Secondary | ICD-10-CM | POA: Diagnosis not present

## 2019-10-20 DIAGNOSIS — D500 Iron deficiency anemia secondary to blood loss (chronic): Secondary | ICD-10-CM | POA: Diagnosis not present

## 2019-10-20 DIAGNOSIS — Z993 Dependence on wheelchair: Secondary | ICD-10-CM | POA: Diagnosis not present

## 2019-10-20 DIAGNOSIS — Z89612 Acquired absence of left leg above knee: Secondary | ICD-10-CM | POA: Diagnosis not present

## 2019-10-20 DIAGNOSIS — Z4781 Encounter for orthopedic aftercare following surgical amputation: Secondary | ICD-10-CM | POA: Diagnosis not present

## 2019-10-20 DIAGNOSIS — I2510 Atherosclerotic heart disease of native coronary artery without angina pectoris: Secondary | ICD-10-CM | POA: Diagnosis not present

## 2019-10-20 DIAGNOSIS — I10 Essential (primary) hypertension: Secondary | ICD-10-CM | POA: Diagnosis not present

## 2019-10-20 DIAGNOSIS — J449 Chronic obstructive pulmonary disease, unspecified: Secondary | ICD-10-CM | POA: Diagnosis not present

## 2019-10-20 DIAGNOSIS — F330 Major depressive disorder, recurrent, mild: Secondary | ICD-10-CM | POA: Diagnosis not present

## 2019-10-20 DIAGNOSIS — F17210 Nicotine dependence, cigarettes, uncomplicated: Secondary | ICD-10-CM | POA: Diagnosis not present

## 2019-10-20 DIAGNOSIS — Z794 Long term (current) use of insulin: Secondary | ICD-10-CM | POA: Diagnosis not present

## 2019-10-26 DIAGNOSIS — J449 Chronic obstructive pulmonary disease, unspecified: Secondary | ICD-10-CM | POA: Diagnosis not present

## 2019-10-26 DIAGNOSIS — E1151 Type 2 diabetes mellitus with diabetic peripheral angiopathy without gangrene: Secondary | ICD-10-CM | POA: Diagnosis not present

## 2019-10-26 DIAGNOSIS — D500 Iron deficiency anemia secondary to blood loss (chronic): Secondary | ICD-10-CM | POA: Diagnosis not present

## 2019-10-26 DIAGNOSIS — Z4781 Encounter for orthopedic aftercare following surgical amputation: Secondary | ICD-10-CM | POA: Diagnosis not present

## 2019-10-26 DIAGNOSIS — I2510 Atherosclerotic heart disease of native coronary artery without angina pectoris: Secondary | ICD-10-CM | POA: Diagnosis not present

## 2019-10-26 DIAGNOSIS — I10 Essential (primary) hypertension: Secondary | ICD-10-CM | POA: Diagnosis not present

## 2019-10-31 DIAGNOSIS — I10 Essential (primary) hypertension: Secondary | ICD-10-CM | POA: Diagnosis not present

## 2019-10-31 DIAGNOSIS — Z4781 Encounter for orthopedic aftercare following surgical amputation: Secondary | ICD-10-CM | POA: Diagnosis not present

## 2019-10-31 DIAGNOSIS — J449 Chronic obstructive pulmonary disease, unspecified: Secondary | ICD-10-CM | POA: Diagnosis not present

## 2019-10-31 DIAGNOSIS — I2510 Atherosclerotic heart disease of native coronary artery without angina pectoris: Secondary | ICD-10-CM | POA: Diagnosis not present

## 2019-10-31 DIAGNOSIS — E1151 Type 2 diabetes mellitus with diabetic peripheral angiopathy without gangrene: Secondary | ICD-10-CM | POA: Diagnosis not present

## 2019-10-31 DIAGNOSIS — D500 Iron deficiency anemia secondary to blood loss (chronic): Secondary | ICD-10-CM | POA: Diagnosis not present

## 2019-11-09 DIAGNOSIS — I2510 Atherosclerotic heart disease of native coronary artery without angina pectoris: Secondary | ICD-10-CM | POA: Diagnosis not present

## 2019-11-09 DIAGNOSIS — Z4781 Encounter for orthopedic aftercare following surgical amputation: Secondary | ICD-10-CM | POA: Diagnosis not present

## 2019-11-09 DIAGNOSIS — J449 Chronic obstructive pulmonary disease, unspecified: Secondary | ICD-10-CM | POA: Diagnosis not present

## 2019-11-09 DIAGNOSIS — I10 Essential (primary) hypertension: Secondary | ICD-10-CM | POA: Diagnosis not present

## 2019-11-09 DIAGNOSIS — D500 Iron deficiency anemia secondary to blood loss (chronic): Secondary | ICD-10-CM | POA: Diagnosis not present

## 2019-11-09 DIAGNOSIS — E1151 Type 2 diabetes mellitus with diabetic peripheral angiopathy without gangrene: Secondary | ICD-10-CM | POA: Diagnosis not present

## 2019-11-14 DIAGNOSIS — Z4781 Encounter for orthopedic aftercare following surgical amputation: Secondary | ICD-10-CM | POA: Diagnosis not present

## 2019-11-14 DIAGNOSIS — D500 Iron deficiency anemia secondary to blood loss (chronic): Secondary | ICD-10-CM | POA: Diagnosis not present

## 2019-11-14 DIAGNOSIS — E1151 Type 2 diabetes mellitus with diabetic peripheral angiopathy without gangrene: Secondary | ICD-10-CM | POA: Diagnosis not present

## 2019-11-14 DIAGNOSIS — I10 Essential (primary) hypertension: Secondary | ICD-10-CM | POA: Diagnosis not present

## 2019-11-14 DIAGNOSIS — I2510 Atherosclerotic heart disease of native coronary artery without angina pectoris: Secondary | ICD-10-CM | POA: Diagnosis not present

## 2019-11-14 DIAGNOSIS — J449 Chronic obstructive pulmonary disease, unspecified: Secondary | ICD-10-CM | POA: Diagnosis not present

## 2019-11-17 DIAGNOSIS — N2889 Other specified disorders of kidney and ureter: Secondary | ICD-10-CM | POA: Diagnosis not present

## 2019-11-17 DIAGNOSIS — Q632 Ectopic kidney: Secondary | ICD-10-CM | POA: Diagnosis not present

## 2019-11-19 DIAGNOSIS — E1151 Type 2 diabetes mellitus with diabetic peripheral angiopathy without gangrene: Secondary | ICD-10-CM | POA: Diagnosis not present

## 2019-11-19 DIAGNOSIS — Z89612 Acquired absence of left leg above knee: Secondary | ICD-10-CM | POA: Diagnosis not present

## 2019-11-19 DIAGNOSIS — J449 Chronic obstructive pulmonary disease, unspecified: Secondary | ICD-10-CM | POA: Diagnosis not present

## 2019-11-19 DIAGNOSIS — Z993 Dependence on wheelchair: Secondary | ICD-10-CM | POA: Diagnosis not present

## 2019-11-19 DIAGNOSIS — F330 Major depressive disorder, recurrent, mild: Secondary | ICD-10-CM | POA: Diagnosis not present

## 2019-11-19 DIAGNOSIS — E1140 Type 2 diabetes mellitus with diabetic neuropathy, unspecified: Secondary | ICD-10-CM | POA: Diagnosis not present

## 2019-11-19 DIAGNOSIS — Z4781 Encounter for orthopedic aftercare following surgical amputation: Secondary | ICD-10-CM | POA: Diagnosis not present

## 2019-11-19 DIAGNOSIS — Z794 Long term (current) use of insulin: Secondary | ICD-10-CM | POA: Diagnosis not present

## 2019-11-19 DIAGNOSIS — Z9181 History of falling: Secondary | ICD-10-CM | POA: Diagnosis not present

## 2019-11-19 DIAGNOSIS — F17210 Nicotine dependence, cigarettes, uncomplicated: Secondary | ICD-10-CM | POA: Diagnosis not present

## 2019-11-19 DIAGNOSIS — I2510 Atherosclerotic heart disease of native coronary artery without angina pectoris: Secondary | ICD-10-CM | POA: Diagnosis not present

## 2019-11-19 DIAGNOSIS — I10 Essential (primary) hypertension: Secondary | ICD-10-CM | POA: Diagnosis not present

## 2019-11-19 DIAGNOSIS — D500 Iron deficiency anemia secondary to blood loss (chronic): Secondary | ICD-10-CM | POA: Diagnosis not present

## 2019-11-20 DIAGNOSIS — I10 Essential (primary) hypertension: Secondary | ICD-10-CM | POA: Diagnosis not present

## 2019-11-20 DIAGNOSIS — E1151 Type 2 diabetes mellitus with diabetic peripheral angiopathy without gangrene: Secondary | ICD-10-CM | POA: Diagnosis not present

## 2019-11-20 DIAGNOSIS — D500 Iron deficiency anemia secondary to blood loss (chronic): Secondary | ICD-10-CM | POA: Diagnosis not present

## 2019-11-20 DIAGNOSIS — Z4781 Encounter for orthopedic aftercare following surgical amputation: Secondary | ICD-10-CM | POA: Diagnosis not present

## 2019-11-20 DIAGNOSIS — I2510 Atherosclerotic heart disease of native coronary artery without angina pectoris: Secondary | ICD-10-CM | POA: Diagnosis not present

## 2019-11-20 DIAGNOSIS — J449 Chronic obstructive pulmonary disease, unspecified: Secondary | ICD-10-CM | POA: Diagnosis not present

## 2019-11-21 DIAGNOSIS — I2510 Atherosclerotic heart disease of native coronary artery without angina pectoris: Secondary | ICD-10-CM | POA: Diagnosis not present

## 2019-11-21 DIAGNOSIS — E1151 Type 2 diabetes mellitus with diabetic peripheral angiopathy without gangrene: Secondary | ICD-10-CM | POA: Diagnosis not present

## 2019-11-21 DIAGNOSIS — D500 Iron deficiency anemia secondary to blood loss (chronic): Secondary | ICD-10-CM | POA: Diagnosis not present

## 2019-11-21 DIAGNOSIS — J449 Chronic obstructive pulmonary disease, unspecified: Secondary | ICD-10-CM | POA: Diagnosis not present

## 2019-11-21 DIAGNOSIS — I10 Essential (primary) hypertension: Secondary | ICD-10-CM | POA: Diagnosis not present

## 2019-11-21 DIAGNOSIS — Z4781 Encounter for orthopedic aftercare following surgical amputation: Secondary | ICD-10-CM | POA: Diagnosis not present

## 2019-11-24 DIAGNOSIS — D500 Iron deficiency anemia secondary to blood loss (chronic): Secondary | ICD-10-CM | POA: Diagnosis not present

## 2019-11-24 DIAGNOSIS — I10 Essential (primary) hypertension: Secondary | ICD-10-CM | POA: Diagnosis not present

## 2019-11-24 DIAGNOSIS — Z4781 Encounter for orthopedic aftercare following surgical amputation: Secondary | ICD-10-CM | POA: Diagnosis not present

## 2019-11-24 DIAGNOSIS — J449 Chronic obstructive pulmonary disease, unspecified: Secondary | ICD-10-CM | POA: Diagnosis not present

## 2019-11-24 DIAGNOSIS — I2510 Atherosclerotic heart disease of native coronary artery without angina pectoris: Secondary | ICD-10-CM | POA: Diagnosis not present

## 2019-11-24 DIAGNOSIS — E1151 Type 2 diabetes mellitus with diabetic peripheral angiopathy without gangrene: Secondary | ICD-10-CM | POA: Diagnosis not present

## 2019-11-28 DIAGNOSIS — Z4781 Encounter for orthopedic aftercare following surgical amputation: Secondary | ICD-10-CM | POA: Diagnosis not present

## 2019-11-28 DIAGNOSIS — I2510 Atherosclerotic heart disease of native coronary artery without angina pectoris: Secondary | ICD-10-CM | POA: Diagnosis not present

## 2019-11-28 DIAGNOSIS — D500 Iron deficiency anemia secondary to blood loss (chronic): Secondary | ICD-10-CM | POA: Diagnosis not present

## 2019-11-28 DIAGNOSIS — I10 Essential (primary) hypertension: Secondary | ICD-10-CM | POA: Diagnosis not present

## 2019-11-28 DIAGNOSIS — J449 Chronic obstructive pulmonary disease, unspecified: Secondary | ICD-10-CM | POA: Diagnosis not present

## 2019-11-28 DIAGNOSIS — E1151 Type 2 diabetes mellitus with diabetic peripheral angiopathy without gangrene: Secondary | ICD-10-CM | POA: Diagnosis not present

## 2019-11-30 DIAGNOSIS — I2510 Atherosclerotic heart disease of native coronary artery without angina pectoris: Secondary | ICD-10-CM | POA: Diagnosis not present

## 2019-11-30 DIAGNOSIS — E1151 Type 2 diabetes mellitus with diabetic peripheral angiopathy without gangrene: Secondary | ICD-10-CM | POA: Diagnosis not present

## 2019-11-30 DIAGNOSIS — Z4781 Encounter for orthopedic aftercare following surgical amputation: Secondary | ICD-10-CM | POA: Diagnosis not present

## 2019-11-30 DIAGNOSIS — D500 Iron deficiency anemia secondary to blood loss (chronic): Secondary | ICD-10-CM | POA: Diagnosis not present

## 2019-11-30 DIAGNOSIS — J449 Chronic obstructive pulmonary disease, unspecified: Secondary | ICD-10-CM | POA: Diagnosis not present

## 2019-11-30 DIAGNOSIS — I10 Essential (primary) hypertension: Secondary | ICD-10-CM | POA: Diagnosis not present

## 2019-12-01 DIAGNOSIS — I10 Essential (primary) hypertension: Secondary | ICD-10-CM | POA: Diagnosis not present

## 2019-12-01 DIAGNOSIS — Z4781 Encounter for orthopedic aftercare following surgical amputation: Secondary | ICD-10-CM | POA: Diagnosis not present

## 2019-12-01 DIAGNOSIS — J449 Chronic obstructive pulmonary disease, unspecified: Secondary | ICD-10-CM | POA: Diagnosis not present

## 2019-12-01 DIAGNOSIS — E1151 Type 2 diabetes mellitus with diabetic peripheral angiopathy without gangrene: Secondary | ICD-10-CM | POA: Diagnosis not present

## 2019-12-01 DIAGNOSIS — I2510 Atherosclerotic heart disease of native coronary artery without angina pectoris: Secondary | ICD-10-CM | POA: Diagnosis not present

## 2019-12-01 DIAGNOSIS — D500 Iron deficiency anemia secondary to blood loss (chronic): Secondary | ICD-10-CM | POA: Diagnosis not present

## 2019-12-04 DIAGNOSIS — J449 Chronic obstructive pulmonary disease, unspecified: Secondary | ICD-10-CM | POA: Diagnosis not present

## 2019-12-04 DIAGNOSIS — I2510 Atherosclerotic heart disease of native coronary artery without angina pectoris: Secondary | ICD-10-CM | POA: Diagnosis not present

## 2019-12-04 DIAGNOSIS — D500 Iron deficiency anemia secondary to blood loss (chronic): Secondary | ICD-10-CM | POA: Diagnosis not present

## 2019-12-04 DIAGNOSIS — E1151 Type 2 diabetes mellitus with diabetic peripheral angiopathy without gangrene: Secondary | ICD-10-CM | POA: Diagnosis not present

## 2019-12-04 DIAGNOSIS — Z4781 Encounter for orthopedic aftercare following surgical amputation: Secondary | ICD-10-CM | POA: Diagnosis not present

## 2019-12-04 DIAGNOSIS — I10 Essential (primary) hypertension: Secondary | ICD-10-CM | POA: Diagnosis not present

## 2019-12-07 DIAGNOSIS — D500 Iron deficiency anemia secondary to blood loss (chronic): Secondary | ICD-10-CM | POA: Diagnosis not present

## 2019-12-07 DIAGNOSIS — I10 Essential (primary) hypertension: Secondary | ICD-10-CM | POA: Diagnosis not present

## 2019-12-07 DIAGNOSIS — I2510 Atherosclerotic heart disease of native coronary artery without angina pectoris: Secondary | ICD-10-CM | POA: Diagnosis not present

## 2019-12-07 DIAGNOSIS — E1151 Type 2 diabetes mellitus with diabetic peripheral angiopathy without gangrene: Secondary | ICD-10-CM | POA: Diagnosis not present

## 2019-12-07 DIAGNOSIS — J449 Chronic obstructive pulmonary disease, unspecified: Secondary | ICD-10-CM | POA: Diagnosis not present

## 2019-12-07 DIAGNOSIS — Z4781 Encounter for orthopedic aftercare following surgical amputation: Secondary | ICD-10-CM | POA: Diagnosis not present

## 2019-12-11 DIAGNOSIS — I10 Essential (primary) hypertension: Secondary | ICD-10-CM | POA: Diagnosis not present

## 2019-12-11 DIAGNOSIS — D500 Iron deficiency anemia secondary to blood loss (chronic): Secondary | ICD-10-CM | POA: Diagnosis not present

## 2019-12-11 DIAGNOSIS — E1151 Type 2 diabetes mellitus with diabetic peripheral angiopathy without gangrene: Secondary | ICD-10-CM | POA: Diagnosis not present

## 2019-12-11 DIAGNOSIS — Z4781 Encounter for orthopedic aftercare following surgical amputation: Secondary | ICD-10-CM | POA: Diagnosis not present

## 2019-12-11 DIAGNOSIS — J449 Chronic obstructive pulmonary disease, unspecified: Secondary | ICD-10-CM | POA: Diagnosis not present

## 2019-12-11 DIAGNOSIS — I2510 Atherosclerotic heart disease of native coronary artery without angina pectoris: Secondary | ICD-10-CM | POA: Diagnosis not present

## 2019-12-13 DIAGNOSIS — I2510 Atherosclerotic heart disease of native coronary artery without angina pectoris: Secondary | ICD-10-CM | POA: Diagnosis not present

## 2019-12-13 DIAGNOSIS — E1151 Type 2 diabetes mellitus with diabetic peripheral angiopathy without gangrene: Secondary | ICD-10-CM | POA: Diagnosis not present

## 2019-12-13 DIAGNOSIS — D500 Iron deficiency anemia secondary to blood loss (chronic): Secondary | ICD-10-CM | POA: Diagnosis not present

## 2019-12-13 DIAGNOSIS — J449 Chronic obstructive pulmonary disease, unspecified: Secondary | ICD-10-CM | POA: Diagnosis not present

## 2019-12-13 DIAGNOSIS — I10 Essential (primary) hypertension: Secondary | ICD-10-CM | POA: Diagnosis not present

## 2019-12-13 DIAGNOSIS — Z4781 Encounter for orthopedic aftercare following surgical amputation: Secondary | ICD-10-CM | POA: Diagnosis not present

## 2019-12-18 DIAGNOSIS — Z9114 Patient's other noncompliance with medication regimen: Secondary | ICD-10-CM | POA: Diagnosis not present

## 2019-12-18 DIAGNOSIS — I10 Essential (primary) hypertension: Secondary | ICD-10-CM | POA: Diagnosis not present

## 2019-12-18 DIAGNOSIS — E1140 Type 2 diabetes mellitus with diabetic neuropathy, unspecified: Secondary | ICD-10-CM | POA: Diagnosis not present

## 2019-12-18 DIAGNOSIS — Z720 Tobacco use: Secondary | ICD-10-CM | POA: Diagnosis not present

## 2019-12-18 DIAGNOSIS — K219 Gastro-esophageal reflux disease without esophagitis: Secondary | ICD-10-CM | POA: Diagnosis not present

## 2019-12-18 DIAGNOSIS — I739 Peripheral vascular disease, unspecified: Secondary | ICD-10-CM | POA: Diagnosis not present

## 2019-12-18 DIAGNOSIS — F419 Anxiety disorder, unspecified: Secondary | ICD-10-CM | POA: Diagnosis not present

## 2019-12-18 DIAGNOSIS — M47812 Spondylosis without myelopathy or radiculopathy, cervical region: Secondary | ICD-10-CM | POA: Diagnosis not present

## 2019-12-19 DIAGNOSIS — Z4781 Encounter for orthopedic aftercare following surgical amputation: Secondary | ICD-10-CM | POA: Diagnosis not present

## 2020-01-30 DIAGNOSIS — R262 Difficulty in walking, not elsewhere classified: Secondary | ICD-10-CM | POA: Diagnosis not present

## 2020-01-30 DIAGNOSIS — S78119A Complete traumatic amputation at level between unspecified hip and knee, initial encounter: Secondary | ICD-10-CM | POA: Diagnosis not present

## 2020-02-01 DIAGNOSIS — R262 Difficulty in walking, not elsewhere classified: Secondary | ICD-10-CM | POA: Diagnosis not present

## 2020-02-01 DIAGNOSIS — S78119A Complete traumatic amputation at level between unspecified hip and knee, initial encounter: Secondary | ICD-10-CM | POA: Diagnosis not present

## 2020-02-07 DIAGNOSIS — R262 Difficulty in walking, not elsewhere classified: Secondary | ICD-10-CM | POA: Diagnosis not present

## 2020-02-07 DIAGNOSIS — S78119A Complete traumatic amputation at level between unspecified hip and knee, initial encounter: Secondary | ICD-10-CM | POA: Diagnosis not present

## 2020-02-09 DIAGNOSIS — S78119A Complete traumatic amputation at level between unspecified hip and knee, initial encounter: Secondary | ICD-10-CM | POA: Diagnosis not present

## 2020-02-09 DIAGNOSIS — R262 Difficulty in walking, not elsewhere classified: Secondary | ICD-10-CM | POA: Diagnosis not present

## 2020-02-13 DIAGNOSIS — R262 Difficulty in walking, not elsewhere classified: Secondary | ICD-10-CM | POA: Diagnosis not present

## 2020-02-13 DIAGNOSIS — S78119A Complete traumatic amputation at level between unspecified hip and knee, initial encounter: Secondary | ICD-10-CM | POA: Diagnosis not present

## 2020-02-15 DIAGNOSIS — R262 Difficulty in walking, not elsewhere classified: Secondary | ICD-10-CM | POA: Diagnosis not present

## 2020-02-15 DIAGNOSIS — S78119A Complete traumatic amputation at level between unspecified hip and knee, initial encounter: Secondary | ICD-10-CM | POA: Diagnosis not present

## 2020-02-19 DIAGNOSIS — S78119A Complete traumatic amputation at level between unspecified hip and knee, initial encounter: Secondary | ICD-10-CM | POA: Diagnosis not present

## 2020-02-19 DIAGNOSIS — R262 Difficulty in walking, not elsewhere classified: Secondary | ICD-10-CM | POA: Diagnosis not present

## 2020-02-21 DIAGNOSIS — R262 Difficulty in walking, not elsewhere classified: Secondary | ICD-10-CM | POA: Diagnosis not present

## 2020-02-21 DIAGNOSIS — S78119A Complete traumatic amputation at level between unspecified hip and knee, initial encounter: Secondary | ICD-10-CM | POA: Diagnosis not present

## 2020-02-26 DIAGNOSIS — R262 Difficulty in walking, not elsewhere classified: Secondary | ICD-10-CM | POA: Diagnosis not present

## 2020-02-26 DIAGNOSIS — S78119A Complete traumatic amputation at level between unspecified hip and knee, initial encounter: Secondary | ICD-10-CM | POA: Diagnosis not present

## 2020-02-29 DIAGNOSIS — R2681 Unsteadiness on feet: Secondary | ICD-10-CM | POA: Diagnosis not present

## 2020-02-29 DIAGNOSIS — R262 Difficulty in walking, not elsewhere classified: Secondary | ICD-10-CM | POA: Diagnosis not present

## 2020-02-29 DIAGNOSIS — Z89612 Acquired absence of left leg above knee: Secondary | ICD-10-CM | POA: Diagnosis not present

## 2020-03-04 DIAGNOSIS — R262 Difficulty in walking, not elsewhere classified: Secondary | ICD-10-CM | POA: Diagnosis not present

## 2020-03-04 DIAGNOSIS — R2681 Unsteadiness on feet: Secondary | ICD-10-CM | POA: Diagnosis not present

## 2020-03-04 DIAGNOSIS — Z89612 Acquired absence of left leg above knee: Secondary | ICD-10-CM | POA: Diagnosis not present

## 2020-03-06 DIAGNOSIS — R262 Difficulty in walking, not elsewhere classified: Secondary | ICD-10-CM | POA: Diagnosis not present

## 2020-03-06 DIAGNOSIS — Z89612 Acquired absence of left leg above knee: Secondary | ICD-10-CM | POA: Diagnosis not present

## 2020-03-06 DIAGNOSIS — R2681 Unsteadiness on feet: Secondary | ICD-10-CM | POA: Diagnosis not present

## 2020-03-11 DIAGNOSIS — R2681 Unsteadiness on feet: Secondary | ICD-10-CM | POA: Diagnosis not present

## 2020-03-11 DIAGNOSIS — Z89612 Acquired absence of left leg above knee: Secondary | ICD-10-CM | POA: Diagnosis not present

## 2020-03-11 DIAGNOSIS — R262 Difficulty in walking, not elsewhere classified: Secondary | ICD-10-CM | POA: Diagnosis not present

## 2020-03-13 DIAGNOSIS — R262 Difficulty in walking, not elsewhere classified: Secondary | ICD-10-CM | POA: Diagnosis not present

## 2020-03-13 DIAGNOSIS — R2681 Unsteadiness on feet: Secondary | ICD-10-CM | POA: Diagnosis not present

## 2020-03-13 DIAGNOSIS — Z89612 Acquired absence of left leg above knee: Secondary | ICD-10-CM | POA: Diagnosis not present

## 2020-03-18 DIAGNOSIS — R2681 Unsteadiness on feet: Secondary | ICD-10-CM | POA: Diagnosis not present

## 2020-03-18 DIAGNOSIS — Z89612 Acquired absence of left leg above knee: Secondary | ICD-10-CM | POA: Diagnosis not present

## 2020-03-18 DIAGNOSIS — R262 Difficulty in walking, not elsewhere classified: Secondary | ICD-10-CM | POA: Diagnosis not present

## 2020-03-20 DIAGNOSIS — R262 Difficulty in walking, not elsewhere classified: Secondary | ICD-10-CM | POA: Diagnosis not present

## 2020-03-20 DIAGNOSIS — R2681 Unsteadiness on feet: Secondary | ICD-10-CM | POA: Diagnosis not present

## 2020-03-20 DIAGNOSIS — Z89612 Acquired absence of left leg above knee: Secondary | ICD-10-CM | POA: Diagnosis not present

## 2020-03-25 DIAGNOSIS — R2681 Unsteadiness on feet: Secondary | ICD-10-CM | POA: Diagnosis not present

## 2020-03-25 DIAGNOSIS — Z89612 Acquired absence of left leg above knee: Secondary | ICD-10-CM | POA: Diagnosis not present

## 2020-03-25 DIAGNOSIS — R262 Difficulty in walking, not elsewhere classified: Secondary | ICD-10-CM | POA: Diagnosis not present

## 2020-03-27 DIAGNOSIS — R262 Difficulty in walking, not elsewhere classified: Secondary | ICD-10-CM | POA: Diagnosis not present

## 2020-03-27 DIAGNOSIS — Z89612 Acquired absence of left leg above knee: Secondary | ICD-10-CM | POA: Diagnosis not present

## 2020-03-27 DIAGNOSIS — R2681 Unsteadiness on feet: Secondary | ICD-10-CM | POA: Diagnosis not present

## 2020-04-03 DIAGNOSIS — S78119A Complete traumatic amputation at level between unspecified hip and knee, initial encounter: Secondary | ICD-10-CM | POA: Diagnosis not present

## 2020-04-03 DIAGNOSIS — R2681 Unsteadiness on feet: Secondary | ICD-10-CM | POA: Diagnosis not present

## 2020-04-05 DIAGNOSIS — R2681 Unsteadiness on feet: Secondary | ICD-10-CM | POA: Diagnosis not present

## 2020-04-05 DIAGNOSIS — S78119A Complete traumatic amputation at level between unspecified hip and knee, initial encounter: Secondary | ICD-10-CM | POA: Diagnosis not present

## 2020-04-11 DIAGNOSIS — Z23 Encounter for immunization: Secondary | ICD-10-CM | POA: Diagnosis not present

## 2020-04-15 DIAGNOSIS — S78119A Complete traumatic amputation at level between unspecified hip and knee, initial encounter: Secondary | ICD-10-CM | POA: Diagnosis not present

## 2020-04-15 DIAGNOSIS — R2681 Unsteadiness on feet: Secondary | ICD-10-CM | POA: Diagnosis not present

## 2020-04-17 DIAGNOSIS — R2681 Unsteadiness on feet: Secondary | ICD-10-CM | POA: Diagnosis not present

## 2020-04-17 DIAGNOSIS — S78119A Complete traumatic amputation at level between unspecified hip and knee, initial encounter: Secondary | ICD-10-CM | POA: Diagnosis not present

## 2020-04-22 DIAGNOSIS — S78119A Complete traumatic amputation at level between unspecified hip and knee, initial encounter: Secondary | ICD-10-CM | POA: Diagnosis not present

## 2020-04-22 DIAGNOSIS — R2681 Unsteadiness on feet: Secondary | ICD-10-CM | POA: Diagnosis not present

## 2020-04-24 DIAGNOSIS — R2681 Unsteadiness on feet: Secondary | ICD-10-CM | POA: Diagnosis not present

## 2020-04-24 DIAGNOSIS — S78119A Complete traumatic amputation at level between unspecified hip and knee, initial encounter: Secondary | ICD-10-CM | POA: Diagnosis not present

## 2020-04-29 DIAGNOSIS — Z89612 Acquired absence of left leg above knee: Secondary | ICD-10-CM | POA: Diagnosis not present

## 2020-04-29 DIAGNOSIS — R2681 Unsteadiness on feet: Secondary | ICD-10-CM | POA: Diagnosis not present

## 2020-05-01 DIAGNOSIS — R2681 Unsteadiness on feet: Secondary | ICD-10-CM | POA: Diagnosis not present

## 2020-05-01 DIAGNOSIS — Z89612 Acquired absence of left leg above knee: Secondary | ICD-10-CM | POA: Diagnosis not present

## 2020-05-02 DIAGNOSIS — Z23 Encounter for immunization: Secondary | ICD-10-CM | POA: Diagnosis not present

## 2020-05-06 DIAGNOSIS — M25531 Pain in right wrist: Secondary | ICD-10-CM | POA: Diagnosis not present

## 2020-05-06 DIAGNOSIS — M25571 Pain in right ankle and joints of right foot: Secondary | ICD-10-CM | POA: Diagnosis not present

## 2020-05-06 DIAGNOSIS — M25532 Pain in left wrist: Secondary | ICD-10-CM | POA: Diagnosis not present

## 2020-05-06 DIAGNOSIS — Z89612 Acquired absence of left leg above knee: Secondary | ICD-10-CM | POA: Diagnosis not present

## 2020-05-06 DIAGNOSIS — R262 Difficulty in walking, not elsewhere classified: Secondary | ICD-10-CM | POA: Diagnosis not present

## 2020-05-08 DIAGNOSIS — R2681 Unsteadiness on feet: Secondary | ICD-10-CM | POA: Diagnosis not present

## 2020-05-08 DIAGNOSIS — Z89612 Acquired absence of left leg above knee: Secondary | ICD-10-CM | POA: Diagnosis not present

## 2020-05-09 DIAGNOSIS — E1140 Type 2 diabetes mellitus with diabetic neuropathy, unspecified: Secondary | ICD-10-CM | POA: Diagnosis not present

## 2020-05-09 DIAGNOSIS — Z9114 Patient's other noncompliance with medication regimen: Secondary | ICD-10-CM | POA: Diagnosis not present

## 2020-05-09 DIAGNOSIS — K219 Gastro-esophageal reflux disease without esophagitis: Secondary | ICD-10-CM | POA: Diagnosis not present

## 2020-05-09 DIAGNOSIS — F419 Anxiety disorder, unspecified: Secondary | ICD-10-CM | POA: Diagnosis not present

## 2020-05-09 DIAGNOSIS — I10 Essential (primary) hypertension: Secondary | ICD-10-CM | POA: Diagnosis not present

## 2020-05-09 DIAGNOSIS — M47812 Spondylosis without myelopathy or radiculopathy, cervical region: Secondary | ICD-10-CM | POA: Diagnosis not present

## 2020-05-09 DIAGNOSIS — Z720 Tobacco use: Secondary | ICD-10-CM | POA: Diagnosis not present

## 2020-05-09 DIAGNOSIS — I739 Peripheral vascular disease, unspecified: Secondary | ICD-10-CM | POA: Diagnosis not present

## 2020-05-13 DIAGNOSIS — R2681 Unsteadiness on feet: Secondary | ICD-10-CM | POA: Diagnosis not present

## 2020-05-13 DIAGNOSIS — Z89612 Acquired absence of left leg above knee: Secondary | ICD-10-CM | POA: Diagnosis not present

## 2020-05-15 DIAGNOSIS — I10 Essential (primary) hypertension: Secondary | ICD-10-CM | POA: Diagnosis not present

## 2020-05-15 DIAGNOSIS — Z13220 Encounter for screening for lipoid disorders: Secondary | ICD-10-CM | POA: Diagnosis not present

## 2020-05-15 DIAGNOSIS — K219 Gastro-esophageal reflux disease without esophagitis: Secondary | ICD-10-CM | POA: Diagnosis not present

## 2020-05-15 DIAGNOSIS — E1143 Type 2 diabetes mellitus with diabetic autonomic (poly)neuropathy: Secondary | ICD-10-CM | POA: Diagnosis not present

## 2020-05-15 DIAGNOSIS — F17200 Nicotine dependence, unspecified, uncomplicated: Secondary | ICD-10-CM | POA: Diagnosis not present

## 2020-05-20 DIAGNOSIS — Z89612 Acquired absence of left leg above knee: Secondary | ICD-10-CM | POA: Diagnosis not present

## 2020-05-20 DIAGNOSIS — R2681 Unsteadiness on feet: Secondary | ICD-10-CM | POA: Diagnosis not present

## 2020-05-22 DIAGNOSIS — R29898 Other symptoms and signs involving the musculoskeletal system: Secondary | ICD-10-CM | POA: Diagnosis not present

## 2020-05-22 DIAGNOSIS — I739 Peripheral vascular disease, unspecified: Secondary | ICD-10-CM | POA: Diagnosis not present

## 2020-05-22 DIAGNOSIS — R2681 Unsteadiness on feet: Secondary | ICD-10-CM | POA: Diagnosis not present

## 2020-05-22 DIAGNOSIS — Z89612 Acquired absence of left leg above knee: Secondary | ICD-10-CM | POA: Diagnosis not present

## 2020-05-22 DIAGNOSIS — M25571 Pain in right ankle and joints of right foot: Secondary | ICD-10-CM | POA: Diagnosis not present

## 2020-05-22 DIAGNOSIS — Z7409 Other reduced mobility: Secondary | ICD-10-CM | POA: Diagnosis not present

## 2020-05-27 DIAGNOSIS — R2681 Unsteadiness on feet: Secondary | ICD-10-CM | POA: Diagnosis not present

## 2020-05-27 DIAGNOSIS — Z89612 Acquired absence of left leg above knee: Secondary | ICD-10-CM | POA: Diagnosis not present

## 2020-08-09 DIAGNOSIS — K219 Gastro-esophageal reflux disease without esophagitis: Secondary | ICD-10-CM | POA: Diagnosis not present

## 2020-08-09 DIAGNOSIS — I739 Peripheral vascular disease, unspecified: Secondary | ICD-10-CM | POA: Diagnosis not present

## 2020-08-09 DIAGNOSIS — E1140 Type 2 diabetes mellitus with diabetic neuropathy, unspecified: Secondary | ICD-10-CM | POA: Diagnosis not present

## 2020-08-09 DIAGNOSIS — F419 Anxiety disorder, unspecified: Secondary | ICD-10-CM | POA: Diagnosis not present

## 2020-08-09 DIAGNOSIS — Z9114 Patient's other noncompliance with medication regimen: Secondary | ICD-10-CM | POA: Diagnosis not present

## 2020-08-09 DIAGNOSIS — Z6830 Body mass index (BMI) 30.0-30.9, adult: Secondary | ICD-10-CM | POA: Diagnosis not present

## 2020-08-09 DIAGNOSIS — M47812 Spondylosis without myelopathy or radiculopathy, cervical region: Secondary | ICD-10-CM | POA: Diagnosis not present

## 2020-08-09 DIAGNOSIS — I10 Essential (primary) hypertension: Secondary | ICD-10-CM | POA: Diagnosis not present

## 2020-11-07 DIAGNOSIS — F419 Anxiety disorder, unspecified: Secondary | ICD-10-CM | POA: Diagnosis not present

## 2020-11-07 DIAGNOSIS — K219 Gastro-esophageal reflux disease without esophagitis: Secondary | ICD-10-CM | POA: Diagnosis not present

## 2020-11-07 DIAGNOSIS — I739 Peripheral vascular disease, unspecified: Secondary | ICD-10-CM | POA: Diagnosis not present

## 2020-11-07 DIAGNOSIS — M47812 Spondylosis without myelopathy or radiculopathy, cervical region: Secondary | ICD-10-CM | POA: Diagnosis not present

## 2020-11-07 DIAGNOSIS — E785 Hyperlipidemia, unspecified: Secondary | ICD-10-CM | POA: Diagnosis not present

## 2020-11-07 DIAGNOSIS — E1140 Type 2 diabetes mellitus with diabetic neuropathy, unspecified: Secondary | ICD-10-CM | POA: Diagnosis not present

## 2020-11-07 DIAGNOSIS — Z9114 Patient's other noncompliance with medication regimen: Secondary | ICD-10-CM | POA: Diagnosis not present

## 2020-11-07 DIAGNOSIS — I10 Essential (primary) hypertension: Secondary | ICD-10-CM | POA: Diagnosis not present

## 2021-02-05 DIAGNOSIS — K219 Gastro-esophageal reflux disease without esophagitis: Secondary | ICD-10-CM | POA: Diagnosis not present

## 2021-02-05 DIAGNOSIS — Z9114 Patient's other noncompliance with medication regimen: Secondary | ICD-10-CM | POA: Diagnosis not present

## 2021-02-05 DIAGNOSIS — I10 Essential (primary) hypertension: Secondary | ICD-10-CM | POA: Diagnosis not present

## 2021-02-05 DIAGNOSIS — E785 Hyperlipidemia, unspecified: Secondary | ICD-10-CM | POA: Diagnosis not present

## 2021-02-05 DIAGNOSIS — E1140 Type 2 diabetes mellitus with diabetic neuropathy, unspecified: Secondary | ICD-10-CM | POA: Diagnosis not present

## 2021-02-05 DIAGNOSIS — F419 Anxiety disorder, unspecified: Secondary | ICD-10-CM | POA: Diagnosis not present

## 2021-02-05 DIAGNOSIS — M47812 Spondylosis without myelopathy or radiculopathy, cervical region: Secondary | ICD-10-CM | POA: Diagnosis not present

## 2021-02-05 DIAGNOSIS — I739 Peripheral vascular disease, unspecified: Secondary | ICD-10-CM | POA: Diagnosis not present

## 2021-04-28 DIAGNOSIS — K219 Gastro-esophageal reflux disease without esophagitis: Secondary | ICD-10-CM | POA: Diagnosis not present

## 2021-04-28 DIAGNOSIS — E785 Hyperlipidemia, unspecified: Secondary | ICD-10-CM | POA: Diagnosis not present

## 2021-04-28 DIAGNOSIS — I739 Peripheral vascular disease, unspecified: Secondary | ICD-10-CM | POA: Diagnosis not present

## 2021-04-28 DIAGNOSIS — I10 Essential (primary) hypertension: Secondary | ICD-10-CM | POA: Diagnosis not present

## 2021-04-28 DIAGNOSIS — M47812 Spondylosis without myelopathy or radiculopathy, cervical region: Secondary | ICD-10-CM | POA: Diagnosis not present

## 2021-04-28 DIAGNOSIS — E1140 Type 2 diabetes mellitus with diabetic neuropathy, unspecified: Secondary | ICD-10-CM | POA: Diagnosis not present

## 2021-04-28 DIAGNOSIS — Z720 Tobacco use: Secondary | ICD-10-CM | POA: Diagnosis not present

## 2021-04-28 DIAGNOSIS — Z9114 Patient's other noncompliance with medication regimen: Secondary | ICD-10-CM | POA: Diagnosis not present

## 2021-08-04 DIAGNOSIS — E785 Hyperlipidemia, unspecified: Secondary | ICD-10-CM | POA: Diagnosis not present

## 2021-08-04 DIAGNOSIS — I10 Essential (primary) hypertension: Secondary | ICD-10-CM | POA: Diagnosis not present

## 2021-08-04 DIAGNOSIS — I739 Peripheral vascular disease, unspecified: Secondary | ICD-10-CM | POA: Diagnosis not present

## 2021-08-04 DIAGNOSIS — E1140 Type 2 diabetes mellitus with diabetic neuropathy, unspecified: Secondary | ICD-10-CM | POA: Diagnosis not present

## 2021-08-04 DIAGNOSIS — R1011 Right upper quadrant pain: Secondary | ICD-10-CM | POA: Diagnosis not present

## 2021-08-04 DIAGNOSIS — Z9114 Patient's other noncompliance with medication regimen: Secondary | ICD-10-CM | POA: Diagnosis not present

## 2021-08-04 DIAGNOSIS — M47812 Spondylosis without myelopathy or radiculopathy, cervical region: Secondary | ICD-10-CM | POA: Diagnosis not present

## 2021-08-04 DIAGNOSIS — F419 Anxiety disorder, unspecified: Secondary | ICD-10-CM | POA: Diagnosis not present

## 2021-08-15 DIAGNOSIS — R932 Abnormal findings on diagnostic imaging of liver and biliary tract: Secondary | ICD-10-CM | POA: Diagnosis not present

## 2021-08-15 DIAGNOSIS — R1011 Right upper quadrant pain: Secondary | ICD-10-CM | POA: Diagnosis not present

## 2021-08-15 DIAGNOSIS — Z9049 Acquired absence of other specified parts of digestive tract: Secondary | ICD-10-CM | POA: Diagnosis not present

## 2021-08-15 DIAGNOSIS — I739 Peripheral vascular disease, unspecified: Secondary | ICD-10-CM | POA: Diagnosis not present

## 2021-08-26 ENCOUNTER — Other Ambulatory Visit (HOSPITAL_COMMUNITY): Payer: Self-pay | Admitting: Vascular Surgery

## 2021-08-26 DIAGNOSIS — I739 Peripheral vascular disease, unspecified: Secondary | ICD-10-CM

## 2021-08-27 ENCOUNTER — Encounter: Payer: Self-pay | Admitting: Vascular Surgery

## 2021-08-27 ENCOUNTER — Ambulatory Visit (INDEPENDENT_AMBULATORY_CARE_PROVIDER_SITE_OTHER): Payer: Medicare Other | Admitting: Vascular Surgery

## 2021-08-27 ENCOUNTER — Other Ambulatory Visit: Payer: Self-pay

## 2021-08-27 ENCOUNTER — Ambulatory Visit (INDEPENDENT_AMBULATORY_CARE_PROVIDER_SITE_OTHER): Payer: Medicare Other

## 2021-08-27 VITALS — BP 150/73 | HR 92 | Temp 97.7°F | Resp 18 | Ht 65.0 in

## 2021-08-27 DIAGNOSIS — I739 Peripheral vascular disease, unspecified: Secondary | ICD-10-CM | POA: Diagnosis not present

## 2021-08-27 DIAGNOSIS — I70221 Atherosclerosis of native arteries of extremities with rest pain, right leg: Secondary | ICD-10-CM | POA: Diagnosis not present

## 2021-08-27 NOTE — H&P (View-Only) (Signed)
[facility] [place]  Patient name: [name] MRN: [id number] DOB: [birth year] Sex: female  REASON FOR CONSULT: Evaluation critical limb ischemia right leg.  HPI: [name] is a 52 y.o. female, who is here today for evaluation.  She is here today with her sister.  She has an extremely complex past history.  She had Aasha Dina onset of peripheral vascular occlusive disease and gone multiple bilateral procedures in [place] at [place] [facility].  This dates back to 2012 with a femme distal bypass on the left leg.  She had multiple subsequent attempts at revision up to a left Pham posterior tibial with cadaver vein.  This failed and she underwent initially a below-knee amputation on the left in November 2020 and subsequent above-knee amputation on the left with healing in January 2021.  She also is status post right leg revascularization with a prosthetic bypass to the above-knee position in October 2018.  It is unclear whether she had had any recent surveillance.  She reports that for the past year she has had rest pain in her right foot.  This has been progressively severe.  She reports difficulty with transportation and is very worried about the possibility of right leg amputation and so was reluctant to seek medical care.  She lives in [place] and has transportation to our facility in [place] and therefore is seen here today.  She has no history of cardiac disease.  Does have a history of pulmonary embolus.  Unfortunately has resumed cigarette smoking  Past Medical History:  Diagnosis Date   Anemia 02/05/2012   Anginal pain (HCC)    Anxiety    Arthritis    Asthma    COPD (chronic obstructive pulmonary disease) (HCC)    Depression    Diabetes mellitus ([place]) 01/28/2012   GERD (gastroesophageal reflux disease)    Headache(784.0)    Hypertension    PE (pulmonary embolism)    Xarelto   Peripheral vascular disease (HCC)     Shortness of breath    Tuberculosis     Family History  Problem Relation Age of Onset   Colon cancer Neg Hx    Liver disease Neg Hx     SOCIAL HISTORY: Social History   Socioeconomic History   Marital status: Single    Spouse name: Not on file   Number of children: Not on file   Years of education: Not on file   Highest education level: Not on file  Occupational History   Not on file  Tobacco Use   Smoking status: Every Day    Packs/day: 1.00    Years: 20.00    Pack years: 20.00    Types: Cigarettes   Smokeless tobacco: Never  Substance and Sexual Activity   Alcohol use: No   Drug use: No   Sexual activity: Yes    Birth control/protection: Injection  Other Topics Concern   Not on file  Social History Narrative   Not on file   Social Determinants of Health   Financial Resource Strain: Not on file  Food Insecurity: Not on file  Transportation Needs: Not on file  Physical Activity: Not on file  Stress: Not on file  Social Connections: Not on file  Intimate Partner Violence: Not on file    No Known Allergies  Current Outpatient Medications  Medication Sig Dispense Refill   ALPRAZolam (XANAX) 0.5 MG tablet Take 0.5 mg by mouth 2 (two) times daily as needed for anxiety.  atorvastatin (LIPITOR) 40 MG tablet Take 40 mg by mouth daily. 1 every evening     Calcium Carbonate-Vitamin D (CALCIUM 600 + D PO) Take 1 tablet by mouth daily.      carvedilol (COREG) 3.125 MG tablet Take 3.125 mg by mouth 2 (two) times daily with a meal.     clopidogrel (PLAVIX) 75 MG tablet Take 75 mg by mouth daily.     gabapentin (NEURONTIN) 600 MG tablet Take 600 mg by mouth 4 (four) times daily.      glipiZIDE (GLUCOTROL) 10 MG tablet Take 10 mg by mouth 2 (two) times daily.     insulin aspart (NOVOLOG) 100 UNIT/ML injection Inject into the skin as needed for high blood sugar (per sliding scale).     insulin glargine (LANTUS) 100 UNIT/ML injection Inject 40 Units into the skin 2 (two)  times daily.     linaclotide (LINZESS) 72 MCG capsule Take 1 capsule (72 mcg total) by mouth daily before breakfast. 90 capsule 3   lisinopril (PRINIVIL,ZESTRIL) 5 MG tablet Take 5 mg by mouth every morning.      metFORMIN (GLUCOPHAGE-XR) 500 MG 24 hr tablet Take 1,000 mg by mouth 2 (two) times daily.     Multiple Vitamins-Minerals (COMPLETE MULTIVITAMIN/MINERAL PO) Take 1 tablet by mouth daily.      ondansetron (ZOFRAN) 4 MG tablet Take 1 tablet (4 mg total) by mouth 3 (three) times daily with meals. 90 tablet 2   pantoprazole (PROTONIX) 40 MG tablet Take 1 tablet (40 mg total) by mouth 2 (two) times daily before a meal. 60 tablet 11   promethazine (PHENERGAN) 25 MG tablet Take 0.5 tablets (12.5 mg total) by mouth every 6 (six) hours as needed for nausea or vomiting. 30 tablet 3   ranitidine (ZANTAC) 300 MG tablet Take 300 mg by mouth at bedtime as needed for heartburn. *takes if symptoms not relieved by protonix*     topiramate (TOPAMAX) 25 MG tablet Take 25 mg by mouth every evening.     XARELTO 20 MG TABS tablet Take 1 tablet by mouth daily. (Patient not taking: Reported on 08/27/2021)     No current facility-administered medications for this visit.    REVIEW OF SYSTEMS:  [X]  denotes positive finding, [ ]  denotes negative finding Cardiac  Comments:  Chest pain or chest pressure:    Shortness of breath upon exertion:    Short of breath when lying flat:    Irregular heart rhythm:        Vascular    Pain in calf, thigh, or hip brought on by ambulation:    Pain in feet at night that wakes you up from your sleep:  x   Blood clot in your veins:    Leg swelling:         Pulmonary    Oxygen at home:    Productive cough:     Wheezing:         Neurologic    Sudden weakness in arms or legs:     Sudden numbness in arms or legs:     Sudden onset of difficulty speaking or slurred speech:    Temporary loss of vision in one eye:     Problems with dizziness:         Gastrointestinal     Blood in stool:     Vomited blood:         Genitourinary    Burning when urinating:     Blood in urine:  Psychiatric    Major depression:         Hematologic    Bleeding problems:    Problems with blood clotting too easily:        Skin    Rashes or ulcers:        Constitutional    Fever or chills:      PHYSICAL EXAM: Vitals:   08/27/21 1013  BP: (!) 150/73  Pulse: 92  Resp: 18  Temp: 97.7 F (36.5 C)  TempSrc: Temporal  SpO2: 100%  Height: 5\' 5"  (1.651 m)    GENERAL: The patient is a well-nourished female, in no acute distress. The vital signs are documented above. CARDIOVASCULAR: Palpable femoral pulses bilaterally.  Absent popliteal and distal pulses on the right PULMONARY: There is good air exchange  MUSCULOSKELETAL: There are no major deformities or cyanosis.  Status post left above-knee amputation NEUROLOGIC: No focal weakness or paresthesias are detected. SKIN: There are no ulcers or rashes noted.  Foot is cool with thickened nails.  No ulcers PSYCHIATRIC: The patient has a normal affect.  DATA:  Noninvasive studies in our office today reveal an audible flow in the dorsalis pedis and dampened monophasic flow in the posterior tibial with an ankle arm index of 0.16  MEDICAL ISSUES: Had a long discussion with the patient and her sister.  She reports that she would not consent to amputation if it comes to this.  I did explain that she certainly has critical limb ischemia but fortunately has had no progression and no tissue loss.  I explained that if she is at extremely high risk for amputation without revascularization.  Explained the next step in her evaluation would be formal arteriography.  It appears that her above-knee popliteal incision is anterior to the saphenous vein.  I can feel the saphenous vein in her calf and explained that her success would be dependent on finding a distal target and vein conduit.  We will schedule her for outpatient  arteriography and possible endovascular treatment if possible as an outpatient at [facility].  I explained that in all likelihood this will require surgical treatment.   Rosetta Posner, MD FACS [facility] [phone] Pager [phone]  Note: Portions of this report may have been transcribed using voice recognition software.  Every effort has been made to ensure accuracy; however, inadvertent computerized transcription errors may still be present.

## 2021-08-27 NOTE — H&P (View-Only) (Signed)
[facility] Specialist of [place]  Patient name: [name] MRN: [id number] DOB: [birth year] Sex: female  REASON FOR CONSULT: Evaluation critical limb ischemia right leg.  HPI: [name] is a 51 y.o. female, who is here today for evaluation.  She is here today with her sister.  She has an extremely complex past history.  She had [name] onset of peripheral vascular occlusive disease and gone multiple bilateral procedures in [place] at [facility].  This dates back to 2012 with a femme distal bypass on the left leg.  She had multiple subsequent attempts at revision up to a left Pham posterior tibial with cadaver vein.  This failed and she underwent initially a below-knee amputation on the left in November 2020 and subsequent above-knee amputation on the left with healing in January 2021.  She also is status post right leg revascularization with a prosthetic bypass to the above-knee position in October 2018.  It is unclear whether she had had any recent surveillance.  She reports that for the past year she has had rest pain in her right foot.  This has been progressively severe.  She reports difficulty with transportation and is very worried about the possibility of right leg amputation and so was reluctant to seek medical care.  She lives in [place] and has transportation to our facility in [place] and therefore is seen here today.  She has no history of cardiac disease.  Does have a history of pulmonary embolus.  Unfortunately has resumed cigarette smoking  Past Medical History:  Diagnosis Date   Anemia 02/05/2012   Anginal pain (HCC)    Anxiety    Arthritis    Asthma    COPD (chronic obstructive pulmonary disease) (HCC)    Depression    Diabetes mellitus ([place]) 01/28/2012   GERD (gastroesophageal reflux disease)    Headache(784.0)    Hypertension    PE (pulmonary embolism)    Xarelto   Peripheral vascular disease (HCC)     Shortness of breath    Tuberculosis     Family History  Problem Relation Age of Onset   Colon cancer Neg Hx    Liver disease Neg Hx     SOCIAL HISTORY: Social History   Socioeconomic History   Marital status: Single    Spouse name: Not on file   Number of children: Not on file   Years of education: Not on file   Highest education level: Not on file  Occupational History   Not on file  Tobacco Use   Smoking status: Every Day    Packs/day: 1.00    Years: 20.00    Pack years: 20.00    Types: Cigarettes   Smokeless tobacco: Never  Substance and Sexual Activity   Alcohol use: No   Drug use: No   Sexual activity: Yes    Birth control/protection: Injection  Other Topics Concern   Not on file  Social History Narrative   Not on file   Social Determinants of Health   Financial Resource Strain: Not on file  Food Insecurity: Not on file  Transportation Needs: Not on file  Physical Activity: Not on file  Stress: Not on file  Social Connections: Not on file  Intimate Partner Violence: Not on file    No Known Allergies  Current Outpatient Medications  Medication Sig Dispense Refill   ALPRAZolam (XANAX) 0.5 MG tablet Take 0.5 mg by mouth 2 (two) times daily as needed for anxiety.  atorvastatin (LIPITOR) 40 MG tablet Take 40 mg by mouth daily. 1 every evening     Calcium Carbonate-Vitamin D (CALCIUM 600 + D PO) Take 1 tablet by mouth daily.      carvedilol (COREG) 3.125 MG tablet Take 3.125 mg by mouth 2 (two) times daily with a meal.     clopidogrel (PLAVIX) 75 MG tablet Take 75 mg by mouth daily.     gabapentin (NEURONTIN) 600 MG tablet Take 600 mg by mouth 4 (four) times daily.      glipiZIDE (GLUCOTROL) 10 MG tablet Take 10 mg by mouth 2 (two) times daily.     insulin aspart (NOVOLOG) 100 UNIT/ML injection Inject into the skin as needed for high blood sugar (per sliding scale).     insulin glargine (LANTUS) 100 UNIT/ML injection Inject 40 Units into the skin 2 (two)  times daily.     linaclotide (LINZESS) 72 MCG capsule Take 1 capsule (72 mcg total) by mouth daily before breakfast. 90 capsule 3   lisinopril (PRINIVIL,ZESTRIL) 5 MG tablet Take 5 mg by mouth every morning.      metFORMIN (GLUCOPHAGE-XR) 500 MG 24 hr tablet Take 1,000 mg by mouth 2 (two) times daily.     Multiple Vitamins-Minerals (COMPLETE MULTIVITAMIN/MINERAL PO) Take 1 tablet by mouth daily.      ondansetron (ZOFRAN) 4 MG tablet Take 1 tablet (4 mg total) by mouth 3 (three) times daily with meals. 90 tablet 2   pantoprazole (PROTONIX) 40 MG tablet Take 1 tablet (40 mg total) by mouth 2 (two) times daily before a meal. 60 tablet 11   promethazine (PHENERGAN) 25 MG tablet Take 0.5 tablets (12.5 mg total) by mouth every 6 (six) hours as needed for nausea or vomiting. 30 tablet 3   ranitidine (ZANTAC) 300 MG tablet Take 300 mg by mouth at bedtime as needed for heartburn. *takes if symptoms not relieved by protonix*     topiramate (TOPAMAX) 25 MG tablet Take 25 mg by mouth every evening.     XARELTO 20 MG TABS tablet Take 1 tablet by mouth daily. (Patient not taking: Reported on 08/27/2021)     No current facility-administered medications for this visit.    REVIEW OF SYSTEMS:  [X]  denotes positive finding, [ ]  denotes negative finding Cardiac  Comments:  Chest pain or chest pressure:    Shortness of breath upon exertion:    Short of breath when lying flat:    Irregular heart rhythm:        Vascular    Pain in calf, thigh, or hip brought on by ambulation:    Pain in feet at night that wakes you up from your sleep:  x   Blood clot in your veins:    Leg swelling:         Pulmonary    Oxygen at home:    Productive cough:     Wheezing:         Neurologic    Sudden weakness in arms or legs:     Sudden numbness in arms or legs:     Sudden onset of difficulty speaking or slurred speech:    Temporary loss of vision in one eye:     Problems with dizziness:         Gastrointestinal     Blood in stool:     Vomited blood:         Genitourinary    Burning when urinating:     Blood in urine:  Psychiatric    Major depression:         Hematologic    Bleeding problems:    Problems with blood clotting too easily:        Skin    Rashes or ulcers:        Constitutional    Fever or chills:      PHYSICAL EXAM: Vitals:   08/27/21 1013  BP: (!) 150/73  Pulse: 92  Resp: 18  Temp: 97.7 F (36.5 C)  TempSrc: Temporal  SpO2: 100%  Height: 5\' 5"  (1.651 m)    GENERAL: The patient is a well-nourished female, in no acute distress. The vital signs are documented above. CARDIOVASCULAR: Palpable femoral pulses bilaterally.  Absent popliteal and distal pulses on the right PULMONARY: There is good air exchange  MUSCULOSKELETAL: There are no major deformities or cyanosis.  Status post left above-knee amputation NEUROLOGIC: No focal weakness or paresthesias are detected. SKIN: There are no ulcers or rashes noted.  Foot is cool with thickened nails.  No ulcers PSYCHIATRIC: The patient has a normal affect.  DATA:  Noninvasive studies in our office today reveal an audible flow in the dorsalis pedis and dampened monophasic flow in the posterior tibial with an ankle arm index of 0.16  MEDICAL ISSUES: Had a long discussion with the patient and her sister.  She reports that she would not consent to amputation if it comes to this.  I did explain that she certainly has critical limb ischemia but fortunately has had no progression and no tissue loss.  I explained that if she is at extremely high risk for amputation without revascularization.  Explained the next step in her evaluation would be formal arteriography.  It appears that her above-knee popliteal incision is anterior to the saphenous vein.  I can feel the saphenous vein in her calf and explained that her success would be dependent on finding a distal target and vein conduit.  We will schedule her for outpatient  arteriography and possible endovascular treatment if possible as an outpatient at [facility].  I explained that in all likelihood this will require surgical treatment.   Rosetta Posner, MD FACS [facility] [phone] Pager [phone]  Note: Portions of this report may have been transcribed using voice recognition software.  Every effort has been made to ensure accuracy; however, inadvertent computerized transcription errors may still be present.

## 2021-08-27 NOTE — Progress Notes (Signed)
° ° °[facility] [place] ° °Patient name: [name] MRN: [id number] DOB: 04/01/1971 Sex: female ° °REASON FOR CONSULT: Evaluation critical limb ischemia right leg. ° °HPI: °[name] is a 50 y.o. female, who is here today for evaluation.  She is here today with her sister.  She has an extremely complex past history.  She had [name] onset of peripheral vascular occlusive disease and gone multiple bilateral procedures in [place] at [facility].  This dates back to 2012 with a femme distal bypass on the left leg.  She had multiple subsequent attempts at revision up to a left Pham posterior tibial with cadaver vein.  This failed and she underwent initially a below-knee amputation on the left in November 2020 and subsequent above-knee amputation on the left with healing in January 2021.  She also is status post right leg revascularization with a prosthetic bypass to the above-knee position in October 2018.  It is unclear whether she had had any recent surveillance.  She reports that for the past year she has had rest pain in her right foot.  This has been progressively severe.  She reports difficulty with transportation and is very worried about the possibility of right leg amputation and so was reluctant to seek medical care.  She lives in [place] and has transportation to our facility in [place] and therefore is seen here today.  She has no history of cardiac disease.  Does have a history of pulmonary embolus.  Unfortunately has resumed cigarette smoking ° °Past Medical History:  °Diagnosis Date  ° Anemia 02/05/2012  ° Anginal pain (HCC)   ° Anxiety   ° Arthritis   ° Asthma   ° COPD (chronic obstructive pulmonary disease) (HCC)   ° Depression   ° Diabetes mellitus (HCC) 01/28/2012  ° GERD (gastroesophageal reflux disease)   ° Headache(784.0)   ° Hypertension   ° PE (pulmonary embolism)   ° Xarelto  ° Peripheral vascular disease (HCC)   °  Shortness of breath   ° Tuberculosis   ° ° °Family History  °Problem Relation Age of Onset  ° Colon cancer Neg Hx   ° Liver disease Neg Hx   ° ° °SOCIAL HISTORY: °Social History  ° °Socioeconomic History  ° Marital status: Single  °  Spouse name: Not on file  ° Number of children: Not on file  ° Years of education: Not on file  ° Highest education level: Not on file  °Occupational History  ° Not on file  °Tobacco Use  ° Smoking status: Every Day  °  Packs/day: 1.00  °  Years: 20.00  °  Pack years: 20.00  °  Types: Cigarettes  ° Smokeless tobacco: Never  °Substance and Sexual Activity  ° Alcohol use: No  ° Drug use: No  ° Sexual activity: Yes  °  Birth control/protection: Injection  °Other Topics Concern  ° Not on file  °Social History Narrative  ° Not on file  ° °Social Determinants of Health  ° °Financial Resource Strain: Not on file  °Food Insecurity: Not on file  °Transportation Needs: Not on file  °Physical Activity: Not on file  °Stress: Not on file  °Social Connections: Not on file  °Intimate Partner Violence: Not on file  ° ° °No Known Allergies ° °Current Outpatient Medications  °Medication Sig Dispense Refill  ° ALPRAZolam (XANAX) 0.5 MG tablet Take 0.5 mg by mouth 2 (two) times daily as needed for anxiety.    °   atorvastatin (LIPITOR) 40 MG tablet Take 40 mg by mouth daily. 1 every evening    ° Calcium Carbonate-Vitamin D (CALCIUM 600 + D PO) Take 1 tablet by mouth daily.     ° carvedilol (COREG) 3.125 MG tablet Take 3.125 mg by mouth 2 (two) times daily with a meal.    ° clopidogrel (PLAVIX) 75 MG tablet Take 75 mg by mouth daily.    ° gabapentin (NEURONTIN) 600 MG tablet Take 600 mg by mouth 4 (four) times daily.     ° glipiZIDE (GLUCOTROL) 10 MG tablet Take 10 mg by mouth 2 (two) times daily.    ° insulin aspart (NOVOLOG) 100 UNIT/ML injection Inject into the skin as needed for high blood sugar (per sliding scale).    ° insulin glargine (LANTUS) 100 UNIT/ML injection Inject 40 Units into the skin 2 (two)  times daily.    ° linaclotide (LINZESS) 72 MCG capsule Take 1 capsule (72 mcg total) by mouth daily before breakfast. 90 capsule 3  ° lisinopril (PRINIVIL,ZESTRIL) 5 MG tablet Take 5 mg by mouth every morning.     ° metFORMIN (GLUCOPHAGE-XR) 500 MG 24 hr tablet Take 1,000 mg by mouth 2 (two) times daily.    ° Multiple Vitamins-Minerals (COMPLETE MULTIVITAMIN/MINERAL PO) Take 1 tablet by mouth daily.     ° ondansetron (ZOFRAN) 4 MG tablet Take 1 tablet (4 mg total) by mouth 3 (three) times daily with meals. 90 tablet 2  ° pantoprazole (PROTONIX) 40 MG tablet Take 1 tablet (40 mg total) by mouth 2 (two) times daily before a meal. 60 tablet 11  ° promethazine (PHENERGAN) 25 MG tablet Take 0.5 tablets (12.5 mg total) by mouth every 6 (six) hours as needed for nausea or vomiting. 30 tablet 3  ° ranitidine (ZANTAC) 300 MG tablet Take 300 mg by mouth at bedtime as needed for heartburn. *takes if symptoms not relieved by protonix*    ° topiramate (TOPAMAX) 25 MG tablet Take 25 mg by mouth every evening.    ° XARELTO 20 MG TABS tablet Take 1 tablet by mouth daily. (Patient not taking: Reported on 08/27/2021)    ° °No current facility-administered medications for this visit.  ° ° °REVIEW OF SYSTEMS:  °[X] denotes positive finding, [ ] denotes negative finding °Cardiac  Comments:  °Chest pain or chest pressure:    °Shortness of breath upon exertion:    °Short of breath when lying flat:    °Irregular heart rhythm:    °    °Vascular    °Pain in calf, thigh, or hip brought on by ambulation:    °Pain in feet at night that wakes you up from your sleep:  x   °Blood clot in your veins:    °Leg swelling:     °    °Pulmonary    °Oxygen at home:    °Productive cough:     °Wheezing:     °    °Neurologic    °Sudden weakness in arms or legs:     °Sudden numbness in arms or legs:     °Sudden onset of difficulty speaking or slurred speech:    °Temporary loss of vision in one eye:     °Problems with dizziness:     °    °Gastrointestinal     °Blood in stool:     °Vomited blood:     °    °Genitourinary    °Burning when urinating:     °Blood in urine:    °    °  Psychiatric    °Major depression:     °    °Hematologic    °Bleeding problems:    °Problems with blood clotting too easily:    °    °Skin    °Rashes or ulcers:    °    °Constitutional    °Fever or chills:    ° ° °PHYSICAL EXAM: °Vitals:  ° 08/27/21 1013  °BP: (!) 150/73  °Pulse: 92  °Resp: 18  °Temp: 97.7 °F (36.5 °C)  °TempSrc: Temporal  °SpO2: 100%  °Height: 5' 5" (1.651 m)  ° ° °GENERAL: The patient is a well-nourished female, in no acute distress. The vital signs are documented above. °CARDIOVASCULAR: Palpable femoral pulses bilaterally.  Absent popliteal and distal pulses on the right °PULMONARY: There is good air exchange  °MUSCULOSKELETAL: There are no major deformities or cyanosis.  Status post left above-knee amputation °NEUROLOGIC: No focal weakness or paresthesias are detected. °SKIN: There are no ulcers or rashes noted.  Foot is cool with thickened nails.  No ulcers °PSYCHIATRIC: The patient has a normal affect. ° °DATA:  °Noninvasive studies in our office today reveal an audible flow in the dorsalis pedis and dampened monophasic flow in the posterior tibial with an ankle arm index of 0.16 ° °MEDICAL ISSUES: °Had a long discussion with the patient and her sister.  She reports that she would not consent to amputation if it comes to this.  I did explain that she certainly has critical limb ischemia but fortunately has had no progression and no tissue loss.  I explained that if she is at extremely high risk for amputation without revascularization.  Explained the next step in her evaluation would be formal arteriography.  It appears that her above-knee popliteal incision is anterior to the saphenous vein.  I can feel the saphenous vein in her calf and explained that her success would be dependent on finding a distal target and vein conduit.  We will schedule her for outpatient  arteriography and possible endovascular treatment if possible as an outpatient at [facility].  I explained that in all likelihood this will require surgical treatment. ° ° °Demetris Capell F. Lavoy Bernards, MD FACS °[facility] °Office Tel [phone] °Pager [phone] ° °Note: Portions of this report may have been transcribed using voice recognition software.  Every effort has been made to ensure accuracy; however, inadvertent computerized transcription errors may still be present. ° °

## 2021-09-01 ENCOUNTER — Other Ambulatory Visit: Payer: Self-pay

## 2021-09-05 ENCOUNTER — Ambulatory Visit (HOSPITAL_COMMUNITY)
Admission: RE | Admit: 2021-09-05 | Discharge: 2021-09-05 | Disposition: A | Discharge status: Home / Self Care | Payer: Medicare Other | Attending: Vascular Surgery | Admitting: Vascular Surgery

## 2021-09-05 ENCOUNTER — Ambulatory Visit (HOSPITAL_COMMUNITY)
Admission: RE | Disposition: A | Discharge status: Home / Self Care | Payer: Self-pay | Source: Home / Self Care | Attending: Vascular Surgery

## 2021-09-05 DIAGNOSIS — E1151 Type 2 diabetes mellitus with diabetic peripheral angiopathy without gangrene: Secondary | ICD-10-CM | POA: Diagnosis not present

## 2021-09-05 DIAGNOSIS — Z7984 Long term (current) use of oral hypoglycemic drugs: Secondary | ICD-10-CM | POA: Insufficient documentation

## 2021-09-05 DIAGNOSIS — Z794 Long term (current) use of insulin: Secondary | ICD-10-CM | POA: Diagnosis not present

## 2021-09-05 DIAGNOSIS — T82856A Stenosis of peripheral vascular stent, initial encounter: Secondary | ICD-10-CM | POA: Diagnosis not present

## 2021-09-05 DIAGNOSIS — Z86711 Personal history of pulmonary embolism: Secondary | ICD-10-CM | POA: Diagnosis not present

## 2021-09-05 DIAGNOSIS — I998 Other disorder of circulatory system: Secondary | ICD-10-CM

## 2021-09-05 DIAGNOSIS — F17210 Nicotine dependence, cigarettes, uncomplicated: Secondary | ICD-10-CM | POA: Insufficient documentation

## 2021-09-05 DIAGNOSIS — I70221 Atherosclerosis of native arteries of extremities with rest pain, right leg: Secondary | ICD-10-CM | POA: Diagnosis not present

## 2021-09-05 HISTORY — PX: ABDOMINAL AORTOGRAM W/LOWER EXTREMITY: CATH118223

## 2021-09-05 LAB — POCT I-STAT, CHEM 8
BUN: 14 mg/dL (ref 6–20)
Calcium, Ion: 1.18 mmol/L (ref 1.15–1.40)
Chloride: 107 mmol/L (ref 98–111)
Creatinine, Ser: 1.3 mg/dL — ABNORMAL HIGH (ref 0.44–1.00)
Glucose, Bld: 235 mg/dL — ABNORMAL HIGH (ref 70–99)
HCT: 35 % — ABNORMAL LOW (ref 36.0–46.0)
Hemoglobin: 11.9 g/dL — ABNORMAL LOW (ref 12.0–15.0)
Potassium: 5.2 mmol/L — ABNORMAL HIGH (ref 3.5–5.1)
Sodium: 135 mmol/L (ref 135–145)
TCO2: 21 mmol/L — ABNORMAL LOW (ref 22–32)

## 2021-09-05 LAB — GLUCOSE, CAPILLARY: Glucose-Capillary: 169 mg/dL — ABNORMAL HIGH (ref 70–99)

## 2021-09-05 SURGERY — ABDOMINAL AORTOGRAM W/LOWER EXTREMITY
Anesthesia: LOCAL

## 2021-09-05 MED ORDER — HEPARIN (PORCINE) IN NACL 1000-0.9 UT/500ML-% IV SOLN
INTRAVENOUS | Status: AC
  Filled 2021-09-05: qty 500

## 2021-09-05 MED ORDER — SODIUM CHLORIDE 0.9 % IV SOLN: 250.0000 mL | INTRAVENOUS | Status: DC | PRN

## 2021-09-05 MED ORDER — OXYCODONE-ACETAMINOPHEN 5-325 MG PO TABS: 1.0000 | ORAL_TABLET | ORAL | 0 refills | Status: DC | PRN

## 2021-09-05 MED ORDER — HYDRALAZINE HCL 20 MG/ML IJ SOLN: 5.0000 mg | INTRAMUSCULAR | Status: DC | PRN

## 2021-09-05 MED ORDER — ACETAMINOPHEN 325 MG PO TABS: 650.0000 mg | ORAL_TABLET | ORAL | Status: DC | PRN

## 2021-09-05 MED ORDER — SODIUM CHLORIDE 0.9 % WEIGHT BASED INFUSION: 1.0000 mL/kg/h | INTRAVENOUS | Status: DC

## 2021-09-05 MED ORDER — FENTANYL CITRATE (PF) 100 MCG/2ML IJ SOLN
INTRAMUSCULAR | Status: AC
  Filled 2021-09-05: qty 2

## 2021-09-05 MED ORDER — SODIUM CHLORIDE 0.9% FLUSH: 3.0000 mL | Freq: Two times a day (BID) | INTRAVENOUS | Status: DC

## 2021-09-05 MED ORDER — FENTANYL CITRATE (PF) 100 MCG/2ML IJ SOLN
INTRAMUSCULAR | Status: DC | PRN
  Administered 2021-09-05 (×2): 50 ug via INTRAVENOUS

## 2021-09-05 MED ORDER — MIDAZOLAM HCL 2 MG/2ML IJ SOLN
INTRAMUSCULAR | Status: DC | PRN
  Administered 2021-09-05: 1 mg via INTRAVENOUS

## 2021-09-05 MED ORDER — LIDOCAINE HCL (PF) 1 % IJ SOLN
INTRAMUSCULAR | Status: AC
  Filled 2021-09-05: qty 30

## 2021-09-05 MED ORDER — LIDOCAINE HCL (PF) 1 % IJ SOLN
INTRAMUSCULAR | Status: DC | PRN
  Administered 2021-09-05 (×2): 15 mL

## 2021-09-05 MED ORDER — SODIUM CHLORIDE 0.9% FLUSH: 3.0000 mL | INTRAVENOUS | Status: DC | PRN

## 2021-09-05 MED ORDER — MIDAZOLAM HCL 2 MG/2ML IJ SOLN
INTRAMUSCULAR | Status: AC
  Filled 2021-09-05: qty 2

## 2021-09-05 MED ORDER — OXYCODONE-ACETAMINOPHEN 5-325 MG PO TABS
2.0000 | ORAL_TABLET | Freq: Once | ORAL | Status: AC
  Administered 2021-09-05: 2 via ORAL
  Filled 2021-09-05: qty 2

## 2021-09-05 MED ORDER — LABETALOL HCL 5 MG/ML IV SOLN: 10.0000 mg | INTRAVENOUS | Status: DC | PRN

## 2021-09-05 MED ORDER — ONDANSETRON HCL 4 MG/2ML IJ SOLN: 4.0000 mg | Freq: Four times a day (QID) | INTRAMUSCULAR | Status: DC | PRN

## 2021-09-05 MED ORDER — IODIXANOL 320 MG/ML IV SOLN
INTRAVENOUS | Status: DC | PRN
  Administered 2021-09-05: 80 mL

## 2021-09-05 MED ORDER — SODIUM CHLORIDE 0.9 % IV SOLN: INTRAVENOUS | Status: DC

## 2021-09-05 SURGICAL SUPPLY — 13 items
CATH OMNI FLUSH 5F 65CM (CATHETERS) ×1 IMPLANT
GLIDEWIRE NITREX 0.018X80X5 (WIRE) ×2
GUIDEWIRE NITREX 0.018X80X5 (WIRE) IMPLANT
KIT MICROPUNCTURE NIT STIFF (SHEATH) ×1 IMPLANT
KIT PV (KITS) ×2 IMPLANT
SHEATH PINNACLE 5F 10CM (SHEATH) ×1 IMPLANT
SHEATH PROBE COVER 6X72 (BAG) ×1 IMPLANT
STOPCOCK MORSE 400PSI 3WAY (MISCELLANEOUS) ×1 IMPLANT
SYR MEDRAD MARK 7 150ML (SYRINGE) ×2 IMPLANT
TRANSDUCER W/STOPCOCK (MISCELLANEOUS) ×2 IMPLANT
TRAY PV CATH (CUSTOM PROCEDURE TRAY) ×2 IMPLANT
TUBING CIL FLEX 10 FLL-RA (TUBING) ×1 IMPLANT
WIRE BENTSON .035X145CM (WIRE) ×1 IMPLANT

## 2021-09-05 NOTE — Interval H&P Note (Signed)
History and Physical Interval Note:  09/05/2021 7:53 AM  [name]  has presented today for surgery, with the diagnosis of ischemia.  The various methods of treatment have been discussed with the patient and family. After consideration of risks, benefits and other options for treatment, the patient has consented to  Procedure(s): ABDOMINAL AORTOGRAM W/LOWER EXTREMITY (N/A) as a surgical intervention.  The patient's history has been reviewed, patient examined, no change in status, stable for surgery.  I have reviewed the patient's chart and labs.  Questions were answered to the patient's satisfaction.     [name]

## 2021-09-05 NOTE — Progress Notes (Signed)
Site area: Right groin a 5 french arterial sheath was removed  Site Prior to Removal:  Level 0  Pressure Applied For 20 MINUTES    Bedrest Beginning at 1010am X 4 hours  Manual:   Yes.    Patient Status During Pull:  stable  Post Pull Groin Site:  Level 0  Post Pull Instructions Given:  Yes.    Post Pull Pulses Present:  Yes.    Dressing Applied:  Yes.    Comments:

## 2021-09-05 NOTE — Op Note (Signed)
DATE OF SERVICE: 09/05/2021  PATIENT:  [name]  51 y.o. female  PRE-OPERATIVE DIAGNOSIS:  Atherosclerosis of native and bypassed arteries of right lower extremity causing rest pain  POST-OPERATIVE DIAGNOSIS:  Same  PROCEDURE:   1) Ultrasound guided bilateral common femoral artery access 2) Aortogram 3) Right lower extremity angiogram (68mL total contrast) 4) Conscious sedation (31 minutes)  SURGEON:  Rande Brunt. Lenell Antu, MD  ASSISTANT: none  ANESTHESIA:   local and IV sedation  ESTIMATED BLOOD LOSS: minimal  LOCAL MEDICATIONS USED:  LIDOCAINE   COUNTS: confirmed correct.  PATIENT DISPOSITION:  PACU - hemodynamically stable.   Delay start of Pharmacological VTE agent (>24hrs) due to surgical blood loss or risk of bleeding: no  INDICATION FOR PROCEDURE: [name] is a 51 y.o. female with ischemic rest pain of the right foot. She has severe PAD by non-invasive testing. She has a history of a right femoral-above knee popliteal bypass with goretex graft. After careful discussion of risks, benefits, and alternatives the patient was offered angiogram. The patient understood and wished to proceed.  OPERATIVE FINDINGS:  Terminal aorta and iliac arteries: Occluded left common iliac artery Diffusely diseased arteries which are diminutive and heavily calcified throughout  Right lower extremity: Diffusely diseased arteries which are diminutive and heavily calcified throughout Common femoral artery: Patent.  Heavily diseased.  Diminutive. Profunda femoris artery: Patent.  Heavily diseased.  Diminutive.  Superficial femoral artery: Occluded Popliteal artery: Severe stenosis throughout (90% or greater), but patent to the takeoff of the anterior tibial artery. Anterior tibial artery: Patent from origin to mid calf.  The artery abruptly occludes about the ankle and fills collaterals into the ankle. Tibioperoneal trunk: Occluded Peroneal artery: Occluded Posterior tibial artery:  Occluded Pedal circulation: Severely disadvantaged.  No contrast visualized into the foot.  DESCRIPTION OF PROCEDURE: After identification of the patient in the pre-operative holding area, the patient was transferred to the operating room. The patient was positioned supine on the operating room table. Anesthesia was induced. The groins was prepped and draped in standard fashion. A surgical pause was performed confirming correct patient, procedure, and operative location.  The left groin was anesthetized with subcutaneous injection of 1% lidocaine. Using ultrasound guidance, the left common femoral artery was accessed with micropuncture technique. Fluoroscopy was used to confirm cannulation over the femoral head.  Retrograde angiography was performed.  This showed occlusion of the left common iliac artery.  The right groin was anesthetized with subcutaneous injection of 1% lidocaine. Using ultrasound guidance, the left common femoral artery was accessed with micropuncture technique. Fluoroscopy was used to confirm cannulation over the femoral head  A Benson wire was advanced into the distal aorta. Over the wire an omni flush catheter was advanced to the level of L2. Aortogram was performed - see above for details.   Retrograde angiography was then performed of the right lower extremity through the sheath.  Angiography was performed in stations.  See above for details.  After completion of the study, the sheath was left in place, to be removed in the recovery area.    Conscious sedation was administered with the use of IV fentanyl and midazolam under continuous physician and nurse monitoring.  Heart rate, blood pressure, and oxygen saturation were continuously monitored.  Total sedation time was 31 minutes  Upon completion of the case instrument and sharps counts were confirmed correct. The patient was transferred to the PACU in good condition. I was present for all portions of the procedure.  PLAN:  No options for revascularization.  No outflow to the foot.  Needs an right above-knee amputation for palliation.  Will prescribe her narcotic pain medicine x1 prescription without refill.  My office will reach out to her to schedule the amputation.  Rande Brunt. Lenell Antu, MD [facility] Phone Number: [phone] 09/05/2021 9:40 AM

## 2021-09-05 NOTE — Progress Notes (Signed)
Patient and family was given discharge instructions. Both verbalized understanding. 

## 2021-09-08 ENCOUNTER — Encounter (HOSPITAL_COMMUNITY): Payer: Self-pay | Admitting: Vascular Surgery

## 2021-09-08 MED FILL — Heparin Sod (Porcine)-NaCl IV Soln 1000 Unit/500ML-0.9%: INTRAVENOUS | Qty: 1000 | Status: AC

## 2021-09-10 ENCOUNTER — Telehealth: Payer: Self-pay

## 2021-09-10 NOTE — Telephone Encounter (Signed)
Pt called to r/s her surgery. She wants to r/s for 3/1 and is aware she is to resume Plavix and then hold it again 5 days prior to surgery. We reviewed all of the pre op instructions. Pt verbalized understanding.

## 2021-09-11 ENCOUNTER — Inpatient Hospital Stay: Admit: 2021-09-11 | Payer: Medicare Other | Admitting: Vascular Surgery

## 2021-09-11 SURGERY — AMPUTATION, ABOVE KNEE
Anesthesia: General | Site: Knee | Laterality: Right

## 2021-09-22 ENCOUNTER — Other Ambulatory Visit: Payer: Self-pay | Admitting: Vascular Surgery

## 2021-09-22 ENCOUNTER — Encounter (HOSPITAL_COMMUNITY): Payer: Self-pay | Admitting: Vascular Surgery

## 2021-09-22 ENCOUNTER — Other Ambulatory Visit: Payer: Self-pay

## 2021-09-22 DIAGNOSIS — Z419 Encounter for procedure for purposes other than remedying health state, unspecified: Secondary | ICD-10-CM

## 2021-09-22 DIAGNOSIS — I70221 Atherosclerosis of native arteries of extremities with rest pain, right leg: Secondary | ICD-10-CM

## 2021-09-22 LAB — SARS CORONAVIRUS 2 (TAT 6-24 HRS): SARS Coronavirus 2: NEGATIVE

## 2021-09-22 NOTE — Progress Notes (Signed)
Spoke with pt for pre-op call. Pt denies cardiac history. States she was told by her family that a nurse told them after her last surgery she had a heart attack. She states she hasn't seen a cardiologist. Pt is a type 2 diabetic. She is not sure when her last A1C was but thinks it was 12.1. States her blood sugar runs in the 200's to the 300's. Instructed pt to hold her Glipizide Tuesday and Wednesday. Instructed to hold her Metformin DOS. Instructed pt to take 1/2 of her regular dose of Basaglar insulin Tuesday PM and Wednesday AM. She will take 20 units each time.  If blood sugar is 70 or below, treat with 1/2 cup of clear juice (apple or cranberry) and recheck blood sugar 15 minutes after drinking juice. If blood sugar continues to be 70 or below pt will notify nurse on arrival.   Covid test done today, she understands about wearing a mask when out in public.   Chart sent to Anesthesia PA  I have requested her most recent A1C from [facility] in [place].

## 2021-09-22 NOTE — Progress Notes (Signed)
Anesthesia Chart Review: Same day workup  Patient seen by cardiology at [facility] clinic for evaluation prior to undergoing vascular bypass surgery lower extremity.  She was seen by Dr. Webb Silversmith 01/25/2019 who ordered nuclear stress test for risk stratification.  Nuclear stress demonstrated normal myocardial perfusion and she was cleared for surgery.  Patient was admitted January 2021 at [facility] for sepsis secondary to BKA stump infection.  She is noted to have elevated troponin which was suspected to be secondary to demand ischemia due to severe anemia and acute pulmonary edema.  She had echo showing mildly reduced LV function.  Per hospitalist note 08/29/2019, "Reviewed echo with ejection fraction of 45% and does have some prior scarring but no new wall motion abnormalities."  She was recommended to continue aspirin, statin, and beta-blocker.  Per vascular surgery instructions, patient was instructed to hold Plavix 5 days prior to procedure.  Markedly uncontrolled IDDM2.  Last A1c 12.1 on 08/04/2021 at PCP office.  Patient will need day of surgery labs and evaluation.  EKG 09/05/2021: Sinus rhythm with frequent PVCs.  Rate 93.  Low voltage QRS.  TTE 08/27/2019: Summary   1. Mildly increased left ventricular internal cavity size.   2. LV systolic normal is low normal or mildly reduced with LVEF 45-50% with akinesis and thinning of the inferior base compatible with scar.   3. Trivial pericardial effusion.   4. No significant valvular disease.   5. No intracardiac thrombi, mass or vegetations.   Nuclear stress 01/31/2019 (Care Everywhere): IMPRESSION:  SPECT imaging shows normal perfusion and function.  EF is normal at > 65%.  No transient ischemic dilatation.  Hot gut uptake of tracer.      Zannie Cove [facility] Short Stay Center/Anesthesiology Phone [phone] 09/23/2021 2:29 PM

## 2021-09-23 NOTE — Anesthesia Preprocedure Evaluation (Addendum)
Anesthesia Evaluation  Patient identified by MRN, date of birth, ID band Patient awake    Reviewed: Allergy & Precautions, H&P , NPO status , Patient's Chart, lab work & pertinent test results  Airway Mallampati: II   Neck ROM: full    Dental   Pulmonary shortness of breath, asthma , COPD, Current Smoker and Patient abstained from smoking.,    breath sounds clear to auscultation       Cardiovascular hypertension, + Peripheral Vascular Disease   Rhythm:regular Rate:Normal     Neuro/Psych  Headaches, PSYCHIATRIC DISORDERS Anxiety Depression    GI/Hepatic GERD  ,  Endo/Other  diabetes, Type 2  Renal/GU      Musculoskeletal  (+) Arthritis ,   Abdominal   Peds  Hematology   Anesthesia Other Findings   Reproductive/Obstetrics                            Anesthesia Physical Anesthesia Plan  ASA: 3  Anesthesia Plan: General   Post-op Pain Management:    Induction: Intravenous  PONV Risk Score and Plan: 2 and Ondansetron, Dexamethasone, Midazolam and Treatment may vary due to age or medical condition  Airway Management Planned: Oral ETT  Additional Equipment:   Intra-op Plan:   Post-operative Plan: Extubation in OR  Informed Consent: I have reviewed the patients History and Physical, chart, labs and discussed the procedure including the risks, benefits and alternatives for the proposed anesthesia with the patient or authorized representative who has indicated his/her understanding and acceptance.     Dental advisory given  Plan Discussed with: CRNA, Anesthesiologist and Surgeon  Anesthesia Plan Comments: (PAT note by Antionette Poles, PA-C: Patient seen by cardiology at [facility] clinic for evaluation prior to undergoing vascular bypass surgery lower extremity.  She was seen by Dr. Webb Silversmith 01/25/2019 who ordered nuclear stress test for risk stratification.  Nuclear stress demonstrated  normal myocardial perfusion and she was cleared for surgery.  Patient was admitted January 2021 at [facility] for sepsis secondary to BKA stump infection.  She is noted to have elevated troponin which was suspected to be secondary to demand ischemia due to severe anemia and acute pulmonary edema.  She had echo showing mildly reduced LV function.  Per hospitalist note 08/29/2019, "Reviewed echo with ejection fraction of 45% and does have some prior scarring but no new wall motion abnormalities."  She was recommended to continue aspirin, statin, and beta-blocker.  Per vascular surgery instructions, patient was instructed to hold Plavix 5 days prior to procedure.  Markedly uncontrolled IDDM2.  Last A1c 12.1 on 08/04/2021 at PCP office.  Patient will need day of surgery labs and evaluation.  EKG 09/05/2021: Sinus rhythm with frequent PVCs.  Rate 93.  Low voltage QRS.  TTE 08/27/2019: Summary  1. Mildly increased left ventricular internal cavity size.  2. LV systolic normal is low normal or mildly reduced with LVEF 45-50% with akinesis and thinning of the inferior base compatible with scar.  3. Trivial pericardial effusion.  4. No significant valvular disease.  5. No intracardiac thrombi, mass or vegetations.   Nuclear stress 01/31/2019 (Care Everywhere): IMPRESSION:  SPECT imaging shows normal perfusion and function.  EF is normal at > 65%.  No transient ischemic dilatation.  Hot gut uptake of tracer.  )       Anesthesia Quick Evaluation

## 2021-09-24 ENCOUNTER — Other Ambulatory Visit: Payer: Self-pay

## 2021-09-24 ENCOUNTER — Encounter (HOSPITAL_COMMUNITY): Payer: Self-pay | Admitting: Vascular Surgery

## 2021-09-24 ENCOUNTER — Inpatient Hospital Stay (HOSPITAL_COMMUNITY)
Admission: RE | Admit: 2021-09-24 | Discharge: 2021-10-02 | DRG: 240 | Disposition: A | Discharge status: Skilled Nursing Facility | Payer: Medicare Other | Attending: Internal Medicine | Admitting: Internal Medicine

## 2021-09-24 ENCOUNTER — Inpatient Hospital Stay (HOSPITAL_COMMUNITY): Payer: Medicare Other | Admitting: Physician Assistant

## 2021-09-24 ENCOUNTER — Encounter (HOSPITAL_COMMUNITY)
Admission: RE | Disposition: A | Discharge status: Skilled Nursing Facility | Payer: Self-pay | Source: Home / Self Care | Attending: Internal Medicine

## 2021-09-24 ENCOUNTER — Inpatient Hospital Stay: Payer: Self-pay

## 2021-09-24 DIAGNOSIS — I70221 Atherosclerosis of native arteries of extremities with rest pain, right leg: Secondary | ICD-10-CM | POA: Diagnosis not present

## 2021-09-24 DIAGNOSIS — F32A Depression, unspecified: Secondary | ICD-10-CM | POA: Diagnosis present

## 2021-09-24 DIAGNOSIS — E871 Hypo-osmolality and hyponatremia: Secondary | ICD-10-CM | POA: Diagnosis present

## 2021-09-24 DIAGNOSIS — D509 Iron deficiency anemia, unspecified: Secondary | ICD-10-CM | POA: Diagnosis present

## 2021-09-24 DIAGNOSIS — R5381 Other malaise: Secondary | ICD-10-CM

## 2021-09-24 DIAGNOSIS — F17200 Nicotine dependence, unspecified, uncomplicated: Secondary | ICD-10-CM | POA: Diagnosis present

## 2021-09-24 DIAGNOSIS — F17210 Nicotine dependence, cigarettes, uncomplicated: Secondary | ICD-10-CM | POA: Diagnosis present

## 2021-09-24 DIAGNOSIS — K219 Gastro-esophageal reflux disease without esophagitis: Secondary | ICD-10-CM | POA: Diagnosis present

## 2021-09-24 DIAGNOSIS — D519 Vitamin B12 deficiency anemia, unspecified: Secondary | ICD-10-CM | POA: Diagnosis not present

## 2021-09-24 DIAGNOSIS — E1122 Type 2 diabetes mellitus with diabetic chronic kidney disease: Secondary | ICD-10-CM | POA: Diagnosis not present

## 2021-09-24 DIAGNOSIS — Z7401 Bed confinement status: Secondary | ICD-10-CM | POA: Diagnosis not present

## 2021-09-24 DIAGNOSIS — I70223 Atherosclerosis of native arteries of extremities with rest pain, bilateral legs: Secondary | ICD-10-CM | POA: Diagnosis present

## 2021-09-24 DIAGNOSIS — Z79899 Other long term (current) drug therapy: Secondary | ICD-10-CM

## 2021-09-24 DIAGNOSIS — I998 Other disorder of circulatory system: Secondary | ICD-10-CM

## 2021-09-24 DIAGNOSIS — F418 Other specified anxiety disorders: Secondary | ICD-10-CM | POA: Diagnosis not present

## 2021-09-24 DIAGNOSIS — R1314 Dysphagia, pharyngoesophageal phase: Secondary | ICD-10-CM

## 2021-09-24 DIAGNOSIS — E11649 Type 2 diabetes mellitus with hypoglycemia without coma: Secondary | ICD-10-CM | POA: Diagnosis not present

## 2021-09-24 DIAGNOSIS — R1011 Right upper quadrant pain: Secondary | ICD-10-CM

## 2021-09-24 DIAGNOSIS — E1165 Type 2 diabetes mellitus with hyperglycemia: Secondary | ICD-10-CM | POA: Diagnosis present

## 2021-09-24 DIAGNOSIS — I10 Essential (primary) hypertension: Secondary | ICD-10-CM | POA: Diagnosis present

## 2021-09-24 DIAGNOSIS — M1990 Unspecified osteoarthritis, unspecified site: Secondary | ICD-10-CM | POA: Diagnosis present

## 2021-09-24 DIAGNOSIS — D72828 Other elevated white blood cell count: Secondary | ICD-10-CM | POA: Diagnosis not present

## 2021-09-24 DIAGNOSIS — Z419 Encounter for procedure for purposes other than remedying health state, unspecified: Secondary | ICD-10-CM

## 2021-09-24 DIAGNOSIS — Z9049 Acquired absence of other specified parts of digestive tract: Secondary | ICD-10-CM

## 2021-09-24 DIAGNOSIS — R112 Nausea with vomiting, unspecified: Secondary | ICD-10-CM

## 2021-09-24 DIAGNOSIS — E222 Syndrome of inappropriate secretion of antidiuretic hormone: Secondary | ICD-10-CM | POA: Diagnosis present

## 2021-09-24 DIAGNOSIS — D529 Folate deficiency anemia, unspecified: Secondary | ICD-10-CM | POA: Diagnosis present

## 2021-09-24 DIAGNOSIS — K760 Fatty (change of) liver, not elsewhere classified: Secondary | ICD-10-CM | POA: Diagnosis not present

## 2021-09-24 DIAGNOSIS — N183 Chronic kidney disease, stage 3 unspecified: Secondary | ICD-10-CM | POA: Diagnosis present

## 2021-09-24 DIAGNOSIS — I499 Cardiac arrhythmia, unspecified: Secondary | ICD-10-CM | POA: Diagnosis not present

## 2021-09-24 DIAGNOSIS — K5900 Constipation, unspecified: Secondary | ICD-10-CM | POA: Diagnosis present

## 2021-09-24 DIAGNOSIS — I959 Hypotension, unspecified: Secondary | ICD-10-CM | POA: Diagnosis not present

## 2021-09-24 DIAGNOSIS — J9611 Chronic respiratory failure with hypoxia: Secondary | ICD-10-CM | POA: Diagnosis not present

## 2021-09-24 DIAGNOSIS — Z86711 Personal history of pulmonary embolism: Secondary | ICD-10-CM

## 2021-09-24 DIAGNOSIS — Z794 Long term (current) use of insulin: Secondary | ICD-10-CM

## 2021-09-24 DIAGNOSIS — E669 Obesity, unspecified: Secondary | ICD-10-CM | POA: Diagnosis not present

## 2021-09-24 DIAGNOSIS — D631 Anemia in chronic kidney disease: Secondary | ICD-10-CM | POA: Diagnosis not present

## 2021-09-24 DIAGNOSIS — Z6830 Body mass index (BMI) 30.0-30.9, adult: Secondary | ICD-10-CM

## 2021-09-24 DIAGNOSIS — L905 Scar conditions and fibrosis of skin: Secondary | ICD-10-CM | POA: Diagnosis not present

## 2021-09-24 DIAGNOSIS — J449 Chronic obstructive pulmonary disease, unspecified: Secondary | ICD-10-CM

## 2021-09-24 DIAGNOSIS — I739 Peripheral vascular disease, unspecified: Secondary | ICD-10-CM | POA: Diagnosis present

## 2021-09-24 DIAGNOSIS — R41841 Cognitive communication deficit: Secondary | ICD-10-CM | POA: Diagnosis present

## 2021-09-24 DIAGNOSIS — D649 Anemia, unspecified: Secondary | ICD-10-CM | POA: Diagnosis present

## 2021-09-24 DIAGNOSIS — E785 Hyperlipidemia, unspecified: Secondary | ICD-10-CM | POA: Diagnosis present

## 2021-09-24 DIAGNOSIS — R0600 Dyspnea, unspecified: Secondary | ICD-10-CM | POA: Diagnosis not present

## 2021-09-24 DIAGNOSIS — D6489 Other specified anemias: Secondary | ICD-10-CM | POA: Diagnosis present

## 2021-09-24 DIAGNOSIS — Z4781 Encounter for orthopedic aftercare following surgical amputation: Secondary | ICD-10-CM | POA: Diagnosis not present

## 2021-09-24 DIAGNOSIS — E1151 Type 2 diabetes mellitus with diabetic peripheral angiopathy without gangrene: Principal | ICD-10-CM | POA: Diagnosis present

## 2021-09-24 DIAGNOSIS — I129 Hypertensive chronic kidney disease with stage 1 through stage 4 chronic kidney disease, or unspecified chronic kidney disease: Secondary | ICD-10-CM | POA: Diagnosis present

## 2021-09-24 DIAGNOSIS — M6281 Muscle weakness (generalized): Secondary | ICD-10-CM | POA: Diagnosis present

## 2021-09-24 DIAGNOSIS — E119 Type 2 diabetes mellitus without complications: Secondary | ICD-10-CM

## 2021-09-24 DIAGNOSIS — E875 Hyperkalemia: Secondary | ICD-10-CM | POA: Diagnosis present

## 2021-09-24 DIAGNOSIS — Z7982 Long term (current) use of aspirin: Secondary | ICD-10-CM

## 2021-09-24 DIAGNOSIS — E861 Hypovolemia: Secondary | ICD-10-CM | POA: Diagnosis not present

## 2021-09-24 DIAGNOSIS — IMO0001 Reserved for inherently not codable concepts without codable children: Secondary | ICD-10-CM | POA: Diagnosis present

## 2021-09-24 DIAGNOSIS — Z89612 Acquired absence of left leg above knee: Secondary | ICD-10-CM | POA: Diagnosis not present

## 2021-09-24 DIAGNOSIS — Z7984 Long term (current) use of oral hypoglycemic drugs: Secondary | ICD-10-CM

## 2021-09-24 DIAGNOSIS — E441 Mild protein-calorie malnutrition: Secondary | ICD-10-CM | POA: Diagnosis present

## 2021-09-24 DIAGNOSIS — G8918 Other acute postprocedural pain: Secondary | ICD-10-CM | POA: Diagnosis not present

## 2021-09-24 DIAGNOSIS — I70201 Unspecified atherosclerosis of native arteries of extremities, right leg: Secondary | ICD-10-CM | POA: Diagnosis not present

## 2021-09-24 DIAGNOSIS — D72829 Elevated white blood cell count, unspecified: Secondary | ICD-10-CM

## 2021-09-24 DIAGNOSIS — F419 Anxiety disorder, unspecified: Secondary | ICD-10-CM | POA: Diagnosis present

## 2021-09-24 DIAGNOSIS — Z7902 Long term (current) use of antithrombotics/antiplatelets: Secondary | ICD-10-CM

## 2021-09-24 DIAGNOSIS — N1831 Chronic kidney disease, stage 3a: Secondary | ICD-10-CM | POA: Diagnosis present

## 2021-09-24 DIAGNOSIS — R262 Difficulty in walking, not elsewhere classified: Secondary | ICD-10-CM | POA: Diagnosis present

## 2021-09-24 DIAGNOSIS — R1312 Dysphagia, oropharyngeal phase: Secondary | ICD-10-CM | POA: Diagnosis present

## 2021-09-24 HISTORY — DX: Fatty (change of) liver, not elsewhere classified: K76.0

## 2021-09-24 HISTORY — PX: AMPUTATION: SHX166

## 2021-09-24 HISTORY — DX: Sepsis, unspecified organism: A41.9

## 2021-09-24 LAB — COMPREHENSIVE METABOLIC PANEL
ALT: 9 U/L (ref 0–44)
AST: 10 U/L — ABNORMAL LOW (ref 15–41)
Albumin: 3.3 g/dL — ABNORMAL LOW (ref 3.5–5.0)
Alkaline Phosphatase: 110 U/L (ref 38–126)
Anion gap: 10 (ref 5–15)
BUN: 18 mg/dL (ref 6–20)
CO2: 20 mmol/L — ABNORMAL LOW (ref 22–32)
Calcium: 8.6 mg/dL — ABNORMAL LOW (ref 8.9–10.3)
Chloride: 101 mmol/L (ref 98–111)
Creatinine, Ser: 1.63 mg/dL — ABNORMAL HIGH (ref 0.44–1.00)
GFR, Estimated: 38 mL/min — ABNORMAL LOW (ref >60)
Glucose, Bld: 422 mg/dL — ABNORMAL HIGH (ref 70–99)
Potassium: 5.5 mmol/L — ABNORMAL HIGH (ref 3.5–5.1)
Sodium: 131 mmol/L — ABNORMAL LOW (ref 135–145)
Total Bilirubin: 0.2 mg/dL — ABNORMAL LOW (ref 0.3–1.2)
Total Protein: 6.4 g/dL — ABNORMAL LOW (ref 6.5–8.1)

## 2021-09-24 LAB — TYPE AND SCREEN
ABO/RH(D): O NEG
Antibody Screen: NEGATIVE

## 2021-09-24 LAB — GLUCOSE, CAPILLARY
Glucose-Capillary: 441 mg/dL — ABNORMAL HIGH (ref 70–99)
Glucose-Capillary: 262 mg/dL — ABNORMAL HIGH (ref 70–99)
Glucose-Capillary: 234 mg/dL — ABNORMAL HIGH (ref 70–99)
Glucose-Capillary: 122 mg/dL — ABNORMAL HIGH (ref 70–99)
Glucose-Capillary: 271 mg/dL — ABNORMAL HIGH (ref 70–99)
Glucose-Capillary: 342 mg/dL — ABNORMAL HIGH (ref 70–99)

## 2021-09-24 LAB — PROTIME-INR
INR: 0.9 (ref 0.8–1.2)
Prothrombin Time: 12 seconds (ref 11.4–15.2)

## 2021-09-24 LAB — CBC
HCT: 33.4 % — ABNORMAL LOW (ref 36.0–46.0)
Hemoglobin: 11.1 g/dL — ABNORMAL LOW (ref 12.0–15.0)
MCH: 32.4 pg (ref 26.0–34.0)
MCHC: 33.2 g/dL (ref 30.0–36.0)
MCV: 97.4 fL (ref 80.0–100.0)
Platelets: 332 10*3/uL (ref 150–400)
RBC: 3.43 MIL/uL — ABNORMAL LOW (ref 3.87–5.11)
RDW: 14.4 % (ref 11.5–15.5)
WBC: 11.8 10*3/uL — ABNORMAL HIGH (ref 4.0–10.5)
nRBC: 0 % (ref 0.0–0.2)

## 2021-09-24 LAB — HEMOGLOBIN A1C
Hgb A1c MFr Bld: 10 % — ABNORMAL HIGH (ref 4.8–5.6)
Mean Plasma Glucose: 240.3 mg/dL

## 2021-09-24 LAB — APTT: aPTT: 26 seconds (ref 24–36)

## 2021-09-24 LAB — ABO/RH: ABO/RH(D): O NEG

## 2021-09-24 SURGERY — Surgical Case
Anesthesia: *Unknown

## 2021-09-24 SURGERY — AMPUTATION, ABOVE KNEE
Anesthesia: General | Site: Knee | Laterality: Right

## 2021-09-24 MED ORDER — ALUM & MAG HYDROXIDE-SIMETH 200-200-20 MG/5ML PO SUSP: 15.0000 mL | ORAL | Status: DC | PRN

## 2021-09-24 MED ORDER — DOCUSATE SODIUM 100 MG PO CAPS: 100.0000 mg | ORAL_CAPSULE | Freq: Every day | ORAL | Status: DC

## 2021-09-24 MED ORDER — PHENYLEPHRINE 40 MCG/ML (10ML) SYRINGE FOR IV PUSH (FOR BLOOD PRESSURE SUPPORT)
PREFILLED_SYRINGE | INTRAVENOUS | Status: DC | PRN
  Administered 2021-09-24: 80 ug via INTRAVENOUS
  Administered 2021-09-24 (×2): 120 ug via INTRAVENOUS
  Administered 2021-09-24: 80 ug via INTRAVENOUS

## 2021-09-24 MED ORDER — METOPROLOL TARTRATE 5 MG/5ML IV SOLN: 2.0000 mg | INTRAVENOUS | Status: DC | PRN

## 2021-09-24 MED ORDER — ROCURONIUM BROMIDE 10 MG/ML (PF) SYRINGE
PREFILLED_SYRINGE | INTRAVENOUS | Status: DC | PRN
  Administered 2021-09-24: 50 mg via INTRAVENOUS

## 2021-09-24 MED ORDER — LIDOCAINE 2% (20 MG/ML) 5 ML SYRINGE
INTRAMUSCULAR | Status: AC
  Filled 2021-09-24: qty 5

## 2021-09-24 MED ORDER — BISACODYL 5 MG PO TBEC: 5.0000 mg | DELAYED_RELEASE_TABLET | Freq: Every day | ORAL | Status: DC | PRN

## 2021-09-24 MED ORDER — INSULIN ASPART 100 UNIT/ML IJ SOLN
0.0000 [IU] | INTRAMUSCULAR | Status: AC | PRN
  Administered 2021-09-24: 14 [IU] via SUBCUTANEOUS
  Administered 2021-09-24: 8 [IU] via SUBCUTANEOUS
  Filled 2021-09-24: qty 1

## 2021-09-24 MED ORDER — DOCUSATE SODIUM 100 MG PO CAPS
100.0000 mg | ORAL_CAPSULE | Freq: Every day | ORAL | Status: DC
  Administered 2021-09-24 – 2021-10-01 (×8): 100 mg via ORAL
  Filled 2021-09-24 (×9): qty 1

## 2021-09-24 MED ORDER — LABETALOL HCL 5 MG/ML IV SOLN: 10.0000 mg | INTRAVENOUS | Status: DC | PRN

## 2021-09-24 MED ORDER — 0.9 % SODIUM CHLORIDE (POUR BTL) OPTIME
TOPICAL | Status: DC | PRN
  Administered 2021-09-24: 1000 mL

## 2021-09-24 MED ORDER — OXYCODONE HCL 5 MG PO TABS
5.0000 mg | ORAL_TABLET | Freq: Once | ORAL | Status: AC | PRN
  Administered 2021-09-24: 5 mg via ORAL

## 2021-09-24 MED ORDER — OXYCODONE-ACETAMINOPHEN 5-325 MG PO TABS
1.0000 | ORAL_TABLET | ORAL | Status: DC | PRN
  Administered 2021-10-02: 21:00:00 1 via ORAL
  Filled 2021-09-24: qty 1

## 2021-09-24 MED ORDER — PHENYLEPHRINE HCL-NACL 20-0.9 MG/250ML-% IV SOLN
INTRAVENOUS | Status: DC | PRN
  Administered 2021-09-24: 20 ug/min via INTRAVENOUS

## 2021-09-24 MED ORDER — CARVEDILOL 3.125 MG PO TABS
3.1250 mg | ORAL_TABLET | Freq: Two times a day (BID) | ORAL | Status: DC
  Administered 2021-09-24 – 2021-10-02 (×16): 3.125 mg via ORAL
  Filled 2021-09-24 (×16): qty 1

## 2021-09-24 MED ORDER — ONDANSETRON HCL 4 MG/2ML IJ SOLN: 4.0000 mg | Freq: Four times a day (QID) | INTRAMUSCULAR | Status: DC | PRN

## 2021-09-24 MED ORDER — HYDRALAZINE HCL 20 MG/ML IJ SOLN: 5.0000 mg | INTRAMUSCULAR | Status: DC | PRN

## 2021-09-24 MED ORDER — GABAPENTIN 400 MG PO CAPS
400.0000 mg | ORAL_CAPSULE | Freq: Four times a day (QID) | ORAL | Status: DC
  Administered 2021-09-24 – 2021-09-27 (×11): 400 mg via ORAL
  Filled 2021-09-24 (×11): qty 1

## 2021-09-24 MED ORDER — ROCURONIUM BROMIDE 10 MG/ML (PF) SYRINGE
PREFILLED_SYRINGE | INTRAVENOUS | Status: AC
  Filled 2021-09-24: qty 10

## 2021-09-24 MED ORDER — PHENOL 1.4 % MT LIQD: 1.0000 | OROMUCOSAL | Status: DC | PRN

## 2021-09-24 MED ORDER — OXYCODONE HCL 5 MG/5ML PO SOLN: 5.0000 mg | Freq: Once | ORAL | Status: AC | PRN

## 2021-09-24 MED ORDER — ONDANSETRON HCL 4 MG/2ML IJ SOLN
INTRAMUSCULAR | Status: DC | PRN
  Administered 2021-09-24: 4 mg via INTRAVENOUS

## 2021-09-24 MED ORDER — LIDOCAINE 2% (20 MG/ML) 5 ML SYRINGE
INTRAMUSCULAR | Status: DC | PRN
  Administered 2021-09-24: 60 mg via INTRAVENOUS

## 2021-09-24 MED ORDER — HYDROCODONE-ACETAMINOPHEN 5-325 MG PO TABS
1.0000 | ORAL_TABLET | ORAL | Status: DC | PRN
  Administered 2021-09-25: 2 via ORAL
  Administered 2021-09-25: 1 via ORAL
  Filled 2021-09-24 (×2): qty 2

## 2021-09-24 MED ORDER — NICOTINE 14 MG/24HR TD PT24
14.0000 mg | MEDICATED_PATCH | Freq: Every day | TRANSDERMAL | Status: DC
Start: 2021-09-24 — End: 2021-10-03
  Administered 2021-09-24 – 2021-10-02 (×9): 14 mg via TRANSDERMAL
  Filled 2021-09-24 (×9): qty 1

## 2021-09-24 MED ORDER — INSULIN ASPART 100 UNIT/ML IJ SOLN
0.0000 [IU] | Freq: Every day | INTRAMUSCULAR | Status: DC
  Administered 2021-09-24: 4 [IU] via SUBCUTANEOUS

## 2021-09-24 MED ORDER — INSULIN GLARGINE-YFGN 100 UNIT/ML ~~LOC~~ SOLN
40.0000 [IU] | Freq: Two times a day (BID) | SUBCUTANEOUS | Status: DC
  Administered 2021-09-24 – 2021-09-29 (×11): 40 [IU] via SUBCUTANEOUS
  Filled 2021-09-24 (×13): qty 0.4

## 2021-09-24 MED ORDER — HEPARIN SODIUM (PORCINE) 5000 UNIT/ML IJ SOLN
5000.0000 [IU] | Freq: Three times a day (TID) | INTRAMUSCULAR | Status: DC
  Administered 2021-09-25 – 2021-10-02 (×21): 5000 [IU] via SUBCUTANEOUS
  Filled 2021-09-24 (×20): qty 1

## 2021-09-24 MED ORDER — PROPOFOL 10 MG/ML IV BOLUS
INTRAVENOUS | Status: DC | PRN
  Administered 2021-09-24: 130 mg via INTRAVENOUS
  Administered 2021-09-24: 30 mg via INTRAVENOUS

## 2021-09-24 MED ORDER — LINACLOTIDE 72 MCG PO CAPS
72.0000 ug | ORAL_CAPSULE | Freq: Every day | ORAL | Status: DC
  Filled 2021-09-24: qty 1

## 2021-09-24 MED ORDER — MORPHINE SULFATE (PF) 2 MG/ML IV SOLN
0.5000 mg | INTRAVENOUS | Status: DC | PRN
  Administered 2021-09-24 – 2021-09-25 (×4): 1 mg via INTRAVENOUS
  Filled 2021-09-24 (×4): qty 1

## 2021-09-24 MED ORDER — INSULIN ASPART 100 UNIT/ML IJ SOLN
0.0000 [IU] | Freq: Three times a day (TID) | INTRAMUSCULAR | Status: DC
  Administered 2021-09-25: 5 [IU] via SUBCUTANEOUS
  Administered 2021-09-25: 3 [IU] via SUBCUTANEOUS
  Administered 2021-09-25: 8 [IU] via SUBCUTANEOUS
  Administered 2021-09-26: 2 [IU] via SUBCUTANEOUS
  Administered 2021-09-27 – 2021-09-28 (×2): 3 [IU] via SUBCUTANEOUS
  Administered 2021-09-29: 2 [IU] via SUBCUTANEOUS

## 2021-09-24 MED ORDER — HYDROCODONE-ACETAMINOPHEN 7.5-325 MG PO TABS
1.0000 | ORAL_TABLET | ORAL | Status: DC | PRN
  Administered 2021-09-24 – 2021-10-02 (×6): 2 via ORAL
  Filled 2021-09-24 (×7): qty 2

## 2021-09-24 MED ORDER — CHLORHEXIDINE GLUCONATE 0.12 % MT SOLN
15.0000 mL | Freq: Once | OROMUCOSAL | Status: AC
  Administered 2021-09-24: 15 mL via OROMUCOSAL
  Filled 2021-09-24: qty 15

## 2021-09-24 MED ORDER — MIDAZOLAM HCL 2 MG/2ML IJ SOLN
INTRAMUSCULAR | Status: DC | PRN
  Administered 2021-09-24: 2 mg via INTRAVENOUS

## 2021-09-24 MED ORDER — DEXMEDETOMIDINE (PRECEDEX) IN NS 20 MCG/5ML (4 MCG/ML) IV SYRINGE
PREFILLED_SYRINGE | INTRAVENOUS | Status: DC | PRN
  Administered 2021-09-24: 12 ug via INTRAVENOUS
  Administered 2021-09-24: 8 ug via INTRAVENOUS

## 2021-09-24 MED ORDER — SENNOSIDES-DOCUSATE SODIUM 8.6-50 MG PO TABS: 1.0000 | ORAL_TABLET | Freq: Every evening | ORAL | Status: DC | PRN

## 2021-09-24 MED ORDER — FENTANYL CITRATE (PF) 250 MCG/5ML IJ SOLN
INTRAMUSCULAR | Status: DC | PRN
  Administered 2021-09-24 (×5): 50 ug via INTRAVENOUS

## 2021-09-24 MED ORDER — CHLORHEXIDINE GLUCONATE CLOTH 2 % EX PADS: 6.0000 | MEDICATED_PAD | Freq: Once | CUTANEOUS | Status: DC

## 2021-09-24 MED ORDER — METHOCARBAMOL 500 MG PO TABS
500.0000 mg | ORAL_TABLET | Freq: Two times a day (BID) | ORAL | Status: DC
  Administered 2021-09-24 – 2021-10-02 (×16): 500 mg via ORAL
  Filled 2021-09-24 (×16): qty 1

## 2021-09-24 MED ORDER — ONDANSETRON HCL 4 MG/2ML IJ SOLN
INTRAMUSCULAR | Status: AC
  Filled 2021-09-24: qty 2

## 2021-09-24 MED ORDER — FENTANYL CITRATE (PF) 100 MCG/2ML IJ SOLN
25.0000 ug | INTRAMUSCULAR | Status: AC | PRN
  Administered 2021-09-24: 25 ug via INTRAVENOUS
  Administered 2021-09-24: 50 ug via INTRAVENOUS
  Administered 2021-09-24: 25 ug via INTRAVENOUS
  Administered 2021-09-24: 50 ug via INTRAVENOUS
  Administered 2021-09-24 (×2): 25 ug via INTRAVENOUS

## 2021-09-24 MED ORDER — PANTOPRAZOLE SODIUM 40 MG PO TBEC
40.0000 mg | DELAYED_RELEASE_TABLET | Freq: Every day | ORAL | Status: DC
  Administered 2021-09-24: 40 mg via ORAL
  Filled 2021-09-24: qty 1

## 2021-09-24 MED ORDER — ACETAMINOPHEN 325 MG PO TABS: 325.0000 mg | ORAL_TABLET | Freq: Four times a day (QID) | ORAL | Status: DC | PRN

## 2021-09-24 MED ORDER — CEFAZOLIN SODIUM-DEXTROSE 2-4 GM/100ML-% IV SOLN
2.0000 g | INTRAVENOUS | Status: AC
  Administered 2021-09-24: 2 g via INTRAVENOUS
  Filled 2021-09-24: qty 100

## 2021-09-24 MED ORDER — ALPRAZOLAM 0.5 MG PO TABS
0.5000 mg | ORAL_TABLET | Freq: Three times a day (TID) | ORAL | Status: DC | PRN
  Administered 2021-09-25 – 2021-10-01 (×3): 0.5 mg via ORAL
  Filled 2021-09-24 (×3): qty 1

## 2021-09-24 MED ORDER — CLOPIDOGREL BISULFATE 75 MG PO TABS
75.0000 mg | ORAL_TABLET | Freq: Every day | ORAL | Status: DC
  Administered 2021-09-24 – 2021-10-02 (×9): 75 mg via ORAL
  Filled 2021-09-24 (×9): qty 1

## 2021-09-24 MED ORDER — ACETAMINOPHEN 500 MG PO TABS
500.0000 mg | ORAL_TABLET | Freq: Four times a day (QID) | ORAL | Status: AC
  Administered 2021-09-24 – 2021-09-25 (×3): 500 mg via ORAL
  Filled 2021-09-24 (×3): qty 1

## 2021-09-24 MED ORDER — FENTANYL CITRATE (PF) 250 MCG/5ML IJ SOLN
INTRAMUSCULAR | Status: AC
  Filled 2021-09-24: qty 5

## 2021-09-24 MED ORDER — POTASSIUM CHLORIDE CRYS ER 20 MEQ PO TBCR: 20.0000 meq | EXTENDED_RELEASE_TABLET | Freq: Every day | ORAL | Status: DC | PRN

## 2021-09-24 MED ORDER — TOPIRAMATE 25 MG PO TABS
25.0000 mg | ORAL_TABLET | Freq: Every evening | ORAL | Status: DC
  Administered 2021-09-24 – 2021-10-02 (×9): 25 mg via ORAL
  Filled 2021-09-24 (×9): qty 1

## 2021-09-24 MED ORDER — SODIUM CHLORIDE 0.9 % IV SOLN: INTRAVENOUS | Status: DC

## 2021-09-24 MED ORDER — MAGNESIUM SULFATE 2 GM/50ML IV SOLN: 2.0000 g | Freq: Every day | INTRAVENOUS | Status: DC | PRN

## 2021-09-24 MED ORDER — ORAL CARE MOUTH RINSE: 15.0000 mL | Freq: Once | OROMUCOSAL | Status: AC

## 2021-09-24 MED ORDER — SUGAMMADEX SODIUM 200 MG/2ML IV SOLN
INTRAVENOUS | Status: DC | PRN
  Administered 2021-09-24: 200 mg via INTRAVENOUS

## 2021-09-24 MED ORDER — CEFAZOLIN SODIUM-DEXTROSE 2-4 GM/100ML-% IV SOLN
2.0000 g | Freq: Three times a day (TID) | INTRAVENOUS | Status: AC
  Administered 2021-09-24 – 2021-09-25 (×2): 2 g via INTRAVENOUS
  Filled 2021-09-24 (×2): qty 100

## 2021-09-24 MED ORDER — MIDAZOLAM HCL 2 MG/2ML IJ SOLN
INTRAMUSCULAR | Status: AC
  Filled 2021-09-24: qty 2

## 2021-09-24 MED ORDER — FENTANYL CITRATE (PF) 100 MCG/2ML IJ SOLN
INTRAMUSCULAR | Status: AC
  Filled 2021-09-24: qty 2

## 2021-09-24 MED ORDER — PROPOFOL 10 MG/ML IV BOLUS
INTRAVENOUS | Status: AC
  Filled 2021-09-24: qty 20

## 2021-09-24 MED ORDER — PANTOPRAZOLE SODIUM 40 MG PO TBEC
40.0000 mg | DELAYED_RELEASE_TABLET | Freq: Every day | ORAL | Status: DC
  Administered 2021-09-25 – 2021-10-02 (×8): 40 mg via ORAL
  Filled 2021-09-24 (×8): qty 1

## 2021-09-24 MED ORDER — ATORVASTATIN CALCIUM 40 MG PO TABS
40.0000 mg | ORAL_TABLET | Freq: Every evening | ORAL | Status: DC
  Administered 2021-09-24 – 2021-10-02 (×9): 40 mg via ORAL
  Filled 2021-09-24 (×9): qty 1

## 2021-09-24 MED ORDER — ASPIRIN EC 81 MG PO TBEC
81.0000 mg | DELAYED_RELEASE_TABLET | Freq: Every day | ORAL | Status: DC
  Administered 2021-09-25 – 2021-10-02 (×8): 81 mg via ORAL
  Filled 2021-09-24 (×8): qty 1

## 2021-09-24 MED ORDER — PHENYLEPHRINE 40 MCG/ML (10ML) SYRINGE FOR IV PUSH (FOR BLOOD PRESSURE SUPPORT)
PREFILLED_SYRINGE | INTRAVENOUS | Status: AC
  Filled 2021-09-24: qty 10

## 2021-09-24 MED ORDER — GUAIFENESIN-DM 100-10 MG/5ML PO SYRP: 15.0000 mL | ORAL_SOLUTION | ORAL | Status: DC | PRN

## 2021-09-24 MED ORDER — OXYCODONE HCL 5 MG PO TABS
ORAL_TABLET | ORAL | Status: AC
  Filled 2021-09-24: qty 1

## 2021-09-24 SURGICAL SUPPLY — 71 items
APL PRP STRL LF DISP 70% ISPRP (MISCELLANEOUS) ×1
BAG COUNTER SPONGE SURGICOUNT (BAG) ×2 IMPLANT
BAG SPNG CNTER NS LX DISP (BAG) ×1
BANDAGE ESMARK 6X9 LF (GAUZE/BANDAGES/DRESSINGS) ×1 IMPLANT
BIT DRILL 5/64X5 DISP (BIT) IMPLANT
BLADE SAGITTAL (BLADE)
BLADE SAGITTAL 25.0X1.19X90 (BLADE) IMPLANT
BLADE SAW GIGLI 510 (BLADE) ×2 IMPLANT
BLADE SAW THK.89X75X18XSGTL (BLADE) IMPLANT
BLADE SURG 21 STRL SS (BLADE) ×2 IMPLANT
BNDG CMPR 9X6 STRL LF SNTH (GAUZE/BANDAGES/DRESSINGS) ×1
BNDG COHESIVE 6X5 TAN STRL LF (GAUZE/BANDAGES/DRESSINGS) ×1 IMPLANT
BNDG ELASTIC 4X5.8 VLCR STR LF (GAUZE/BANDAGES/DRESSINGS) ×2 IMPLANT
BNDG ELASTIC 6X5.8 VLCR STR LF (GAUZE/BANDAGES/DRESSINGS) ×2 IMPLANT
BNDG ESMARK 6X9 LF (GAUZE/BANDAGES/DRESSINGS) ×2
BNDG GAUZE ELAST 4 BULKY (GAUZE/BANDAGES/DRESSINGS) ×2 IMPLANT
CANISTER SUCT 3000ML PPV (MISCELLANEOUS) ×2 IMPLANT
CANISTER WOUNDNEG PRESSURE 500 (CANNISTER) ×1 IMPLANT
CHLORAPREP W/TINT 26 (MISCELLANEOUS) ×2 IMPLANT
COVER SURGICAL LIGHT HANDLE (MISCELLANEOUS) ×2 IMPLANT
CUFF TOURN SGL QUICK 24 (TOURNIQUET CUFF)
CUFF TOURN SGL QUICK 34 (TOURNIQUET CUFF) ×2
CUFF TRNQT CYL 24X4X16.5-23 (TOURNIQUET CUFF) IMPLANT
CUFF TRNQT CYL 34X4.125X (TOURNIQUET CUFF) IMPLANT
DRAIN CHANNEL 19F RND (DRAIN) IMPLANT
DRAPE DERMATAC (DRAPES) ×1 IMPLANT
DRAPE HALF SHEET 40X57 (DRAPES) ×2 IMPLANT
DRAPE INCISE IOBAN 66X45 STRL (DRAPES) ×2 IMPLANT
DRAPE ORTHO SPLIT 77X108 STRL (DRAPES) ×4
DRAPE SURG ORHT 6 SPLT 77X108 (DRAPES) ×2 IMPLANT
DRESSING PEEL AND PLAC PRVNA20 (GAUZE/BANDAGES/DRESSINGS) IMPLANT
DRESSING PREVENA PLUS CUSTOM (GAUZE/BANDAGES/DRESSINGS) IMPLANT
DRSG PEEL AND PLACE PREVENA 20 (GAUZE/BANDAGES/DRESSINGS) ×4
DRSG PREVENA PLUS CUSTOM (GAUZE/BANDAGES/DRESSINGS)
ELECT CAUTERY BLADE 6.4 (BLADE) ×2 IMPLANT
ELECT REM PT RETURN 9FT ADLT (ELECTROSURGICAL) ×2
ELECTRODE REM PT RTRN 9FT ADLT (ELECTROSURGICAL) ×1 IMPLANT
EVACUATOR SILICONE 100CC (DRAIN) IMPLANT
GAUZE SPONGE 4X4 12PLY STRL (GAUZE/BANDAGES/DRESSINGS) ×2 IMPLANT
GAUZE XEROFORM 5X9 LF (GAUZE/BANDAGES/DRESSINGS) ×2 IMPLANT
GLOVE SURG POLYISO LF SZ8 (GLOVE) ×2 IMPLANT
GOWN STRL REUS W/ TWL LRG LVL3 (GOWN DISPOSABLE) ×2 IMPLANT
GOWN STRL REUS W/ TWL XL LVL3 (GOWN DISPOSABLE) ×1 IMPLANT
GOWN STRL REUS W/TWL LRG LVL3 (GOWN DISPOSABLE) ×4
GOWN STRL REUS W/TWL XL LVL3 (GOWN DISPOSABLE) ×2
KIT BASIN OR (CUSTOM PROCEDURE TRAY) ×2 IMPLANT
KIT TURNOVER KIT B (KITS) ×2 IMPLANT
NS IRRIG 1000ML POUR BTL (IV SOLUTION) ×2 IMPLANT
PACK GENERAL/GYN (CUSTOM PROCEDURE TRAY) ×2 IMPLANT
PAD ARMBOARD 7.5X6 YLW CONV (MISCELLANEOUS) ×4 IMPLANT
PENCIL SMOKE EVACUATOR (MISCELLANEOUS) ×2 IMPLANT
PREVENA RESTOR ARTHOFORM 46X30 (CANNISTER) IMPLANT
SPONGE T-LAP 18X18 ~~LOC~~+RFID (SPONGE) ×1 IMPLANT
STAPLER SKIN 35 REG (STAPLE) ×2 IMPLANT
STAPLER VISISTAT 35W (STAPLE) ×2 IMPLANT
STOCKINETTE IMPERVIOUS LG (DRAPES) ×2 IMPLANT
SUT ETHILON 2 0 PSLX (SUTURE) ×4 IMPLANT
SUT ETHILON 3 0 PS 1 (SUTURE) IMPLANT
SUT FIBERWIRE #5 38 CONV BLUE (SUTURE)
SUT SILK 0 TIES 10X30 (SUTURE) ×2 IMPLANT
SUT SILK 2 0 (SUTURE) ×2
SUT SILK 2 0 SH CR/8 (SUTURE) ×2 IMPLANT
SUT SILK 2-0 18XBRD TIE 12 (SUTURE) ×1 IMPLANT
SUT SILK 3 0 (SUTURE)
SUT SILK 3-0 18XBRD TIE 12 (SUTURE) IMPLANT
SUT VIC AB 2-0 CT1 18 (SUTURE) ×4 IMPLANT
SUTURE FIBERWR #5 38 CONV BLUE (SUTURE) IMPLANT
TAPE UMBILICAL COTTON 1/8X30 (MISCELLANEOUS) ×2 IMPLANT
TOWEL GREEN STERILE (TOWEL DISPOSABLE) ×4 IMPLANT
UNDERPAD 30X36 HEAVY ABSORB (UNDERPADS AND DIAPERS) ×2 IMPLANT
WATER STERILE IRR 1000ML POUR (IV SOLUTION) ×2 IMPLANT

## 2021-09-24 NOTE — TOC Progression Note (Signed)
Transition of Care (TOC) - Progression Note  ? ? ?Patient Details  ?Name: [name] ?MRN: [id number] ?Date of Birth: June 22, 1971 ? ?Transition of Care (TOC) CM/SW Contact  ?Leone Haven, RN ?Phone Number: ?09/24/2021, 5:08 PM ? ?Clinical Narrative:    ?From home alone, admitted for Evaluation critical limb ischemia right leg.  TOC will continue to follow for dc needs.  ? ? ?  ?  ? ?Expected Discharge Plan and Services ?  ?  ?  ?  ?  ?                ?  ?  ?  ?  ?  ?  ?  ?  ?  ?  ? ? ?Social Determinants of Health (SDOH) Interventions ?  ? ?Readmission Risk Interventions ?No flowsheet data found. ? ?

## 2021-09-24 NOTE — Anesthesia Procedure Notes (Signed)
Procedure Name: Intubation ?Date/Time: 09/24/2021 8:46 AM ?Performed by: Vonna Drafts, CRNA ?Pre-anesthesia Checklist: Patient identified, Emergency Drugs available, Suction available and Patient being monitored ?Patient Re-evaluated:Patient Re-evaluated prior to induction ?Oxygen Delivery Method: Circle system utilized ?Preoxygenation: Pre-oxygenation with 100% oxygen ?Induction Type: IV induction ?Ventilation: Mask ventilation without difficulty and Oral airway inserted - appropriate to patient size ?Laryngoscope Size: Mac and 4 ?Grade View: Grade I ?Tube type: Oral ?Tube size: 7.0 mm ?Number of attempts: 1 ?Airway Equipment and Method: Stylet and Oral airway ?Placement Confirmation: ETT inserted through vocal cords under direct vision, positive ETCO2 and breath sounds checked- equal and bilateral ?Secured at: 21 cm ?Tube secured with: Tape ?Dental Injury: Teeth and Oropharynx as per pre-operative assessment  ? ? ? ? ?

## 2021-09-24 NOTE — Assessment & Plan Note (Addendum)
-  Continue Coreg ?-Hold Lisinopril and monitor renal function ? ?

## 2021-09-24 NOTE — Plan of Care (Signed)

## 2021-09-24 NOTE — Op Note (Addendum)
DATE OF SERVICE: 09/24/2021 ? ?PATIENT:  [name]  51 y.o. female ? ?PRE-OPERATIVE DIAGNOSIS:  ischemic right leg without options for revascularization ? ?POST-OPERATIVE DIAGNOSIS:  Same ? ?PROCEDURE:   ?Right above knee amputation ? ?SURGEON:  Surgeon(s) and Role: ?   * [name], MD - Primary ? ?ASSISTANT: Corrie Baglia ? ?An experienced assistant was required given the complexity of this procedure and the standard of surgical care. My assistant helped with exposure through counter tension, suctioning, ligation and retraction to better visualize the surgical field.  My assistant expedited sewing during the case by following my sutures. Wherever I use the term "we" in the report, my assistant actively helped me with that portion of the procedure. ? ?ANESTHESIA:   general ? ?EBL: ? ?BLOOD ADMINISTERED:none ? ?DRAINS: none  ? ?LOCAL MEDICATIONS USED:  NONE ? ?SPECIMEN:  residual right leg ? ?COUNTS: confirmed correct. ? ?TOURNIQUET:    ?Total Tourniquet Time Documented: ?Thigh (Right) - 12 minutes ?Total: Thigh (Right) - 12 minutes ? ?PATIENT DISPOSITION:  PACU - hemodynamically stable. ?  ?Delay start of Pharmacological VTE agent (>24hrs) due to surgical blood loss or risk of bleeding: no ? ?INDICATION FOR PROCEDURE: [name] is a 51 y.o. female with ischemic right leg with no options for revascularization. After careful discussion of risks, benefits, and alternatives the patient was offered right above knee amputation. The patient  understood and wished to proceed. ? ?OPERATIVE FINDINGS: Unremarkable right above knee amputation. Prior gore-tex bypass debrided back to mid thigh and ligated. ? ?DESCRIPTION OF PROCEDURE: After identification of the patient in the pre-operative holding area, the patient was transferred to the operating room. The patient was positioned supine on the operating room table. Anesthesia was induced. The right leg was prepped and draped in standard fashion. A surgical  pause was performed confirming correct patient, procedure, and operative location. ? ?A generous right thigh fishmouth incision was planned on the right lower extremity with a skin marker. A sterile tourniquet was applied to the proximal thigh. An esmarch tourniquet was used to exsanguinate the leg. The pneumatic tourniquet was inflated. An incision was made as planned. The incision was carried down with a #21 scalpel through the subcutaneous tissue, and through the posterior and anterior compartments of the thigh until the femur was encountered. The periosteum about the femur was elevated as high as possible. The femur was transected with a Gigli saw. The vascular bundle was found in its typical position, and was suture-ligated proximally using a 2-0 silk suture. A prior gore-tex bypass was debrided back to the mid thigh. The tourniquet was released. Hemostasis was achieved in the surgical bed. The wound was copiously irrigated. The wound was closed in layers using 2-0 Vicryl, surgical stapler.  A clean bandage was applied. ? ?Upon completion of the case instrument and sharps counts were confirmed correct. The patient was transferred to the PACU in good condition. I was present for all portions of the procedure. ? ?Rande Brunt. Lenell Antu, MD ?[facility] ?Office Phone Number: [phone] ?09/24/2021 9:55 AM ? ? ? ?

## 2021-09-24 NOTE — Assessment & Plan Note (Addendum)
-  A1C 10.0 ?-hold Glucophage, glucotrol ?-Continue glargine ?-Cover with moderate-scale SSI ?-Blood sugars are currently stable ?

## 2021-09-24 NOTE — Plan of Care (Signed)

## 2021-09-24 NOTE — Interval H&P Note (Signed)
History and Physical Interval Note: ? ?09/24/2021 ?8:05 AM ? ?[name]  has presented today for surgery, with the diagnosis of Critical Limb ischemia right lower extremity.  The various methods of treatment have been discussed with the patient and family. After consideration of risks, benefits and other options for treatment, the patient has consented to  Procedure(s): ?RIGHT ABOVE KNEE AMPUTATION (Right) as a surgical intervention.  The patient's history has been reviewed, patient examined, no change in status, stable for surgery.  I have reviewed the patient's chart and labs.  Questions were answered to the patient's satisfaction.   ? ? ?Cherre Robins ? ? ?

## 2021-09-24 NOTE — Progress Notes (Signed)
Inpatient Diabetes Program Recommendations ? ?AACE/ADA: New Consensus Statement on Inpatient Glycemic Control (2015) ? ?Target Ranges:  Prepandial:   less than 140 mg/dL ?     Peak postprandial:   less than 180 mg/dL (1-2 hours) ?     Critically ill patients:  140 - 180 mg/dL  ? ?Lab Results  ?Component Value Date  ? GLUCAP 262 (H) 09/24/2021  ? ? ?Review of Glycemic Control ? Latest Reference Range & Units 09/24/21 06:54 09/24/21 09:12  ?Glucose-Capillary 70 - 99 mg/dL 441 (H) 262 (H)  ?(H): Data is abnormally high ?Diabetes history: Type 2 DM ?Outpatient Diabetes medications: Glipizide 10 mg BID, Basaglar 40 units BID, Metformin 1000 mg BID ?Current orders for Inpatient glycemic control: Novolog 0-14 Q2H PRN periop ? ?Inpatient Diabetes Program Recommendations:   ? ?Consider adding A1C to assess glycemic control.  ?Following. ? ?Thanks, ?Bronson Curb, MSN, RNC-OB ?Diabetes Coordinator ?[phone] (8a-5p) ? ? ? ?

## 2021-09-24 NOTE — Assessment & Plan Note (Addendum)
-  Patient with prior L AKA presenting for R AKA after aortogram demonstrated no viable revascularization targets ?-Seen by PT/OT with recommendations for SNF placement ?-Continue ASA and Plavix ?-Management per vascular surgery ?-She is having significant pain at her operative site, started on Dilaudid PCA.  Discontinue Dilaudid PCA today.  Continue pain management ?

## 2021-09-24 NOTE — Progress Notes (Signed)
Pt unable to obtain urine sample. Dr. Lenell Antu made aware.  ?

## 2021-09-24 NOTE — H&P (Signed)
?History and Physical  ? ? ?Patient: [name] [id number] DOB: Jan 12, 1971 ?DOA: 09/24/2021 ?DOS: the patient was seen and examined on 09/24/2021 ?PCP: Rory Percy, MD  ?Patient coming from: Home  ? ? ?Chief Complaint: AKA ? ?HPI: [name] is a 51 y.o. female with medical history significant of COPD; DM; HTN; and PVD s/p prior L AKA presenting for R AKA.  She has h/o L BKA (05/2019) -> AKA (07/2019) and she developed R foot pain.  She was seen by vascular surgery on 2/1 and then had aortogram on 2/10 with no options for revascularization and need for R AKA.  She came in today for the procedure.  She was immediately post-op at the time of my evaluation and was still very sedated but was complaining of significant pain. ? ? ? ?ER Course:   Came in today for scheduled AKA, now B AKA.   ? ? ? ? ?Review of Systems: unable to review all systems due to the inability of the patient to answer questions. ?Past Medical History:  ?Diagnosis Date  ? Anemia 02/05/2012  ? Anginal pain ([place])   ? Anxiety   ? Arthritis   ? Asthma   ? COPD (chronic obstructive pulmonary disease) ([place])   ? Depression   ? Diabetes mellitus ([place]) 01/28/2012  ? Fatty liver   ? GERD (gastroesophageal reflux disease)   ? Headache(784.0)   ? Hypertension   ? PE (pulmonary embolism)   ? Xarelto  ? Peripheral vascular disease ([place])   ? Pneumonia 2013  ? Sepsis ([place])   ? Shortness of breath   ? Tuberculosis   ? pt denies  ? ?Past Surgical History:  ?Procedure Laterality Date  ? ABDOMINAL AORTOGRAM W/LOWER EXTREMITY N/A 09/05/2021  ? Procedure: ABDOMINAL AORTOGRAM W/LOWER EXTREMITY;  Surgeon: Cherre Robins, MD;  Location: [facility];  Service: Cardiovascular;  Laterality: N/A;  ? APPENDECTOMY    ? CHOLECYSTECTOMY    ? DIAGNOSTIC LAPAROSCOPY    ? ESOPHAGEAL DILATION N/A 10/09/2014  ? Procedure: ESOPHAGEAL DILATION;  Surgeon: Danie Binder, MD;  Location: [facility];  Service: Endoscopy;  Laterality: N/A;  #15 and #16 savory  ?  ESOPHAGOGASTRODUODENOSCOPY (EGD) WITH PROPOFOL N/A 10/09/2014  ? Dr. Oneida Alar: mild erosive gastritis, Savary dilation. Nausea and vomiting most likely due to uncontroleld blood sugars, reflux, and gastritis.   ? ?Social History:  reports that she has been smoking cigarettes. She has a 20.00 pack-year smoking history. She has never used smokeless tobacco. She reports that she does not drink alcohol and does not use drugs. ? ?No Active Allergies ? ?Family History  ?Problem Relation Age of Onset  ? Colon cancer Neg Hx   ? Liver disease Neg Hx   ? ? ?Prior to Admission medications   ?Medication Sig Start Date End Date Taking? Authorizing Provider  ?ALPRAZolam (XANAX) 0.5 MG tablet Take 0.5 mg by mouth 3 (three) times daily as needed for anxiety.   Yes [provider]  ?aspirin EC 81 MG tablet Take 81 mg by mouth daily. Swallow whole.   Yes [provider]  ?atorvastatin (LIPITOR) 40 MG tablet Take 40 mg by mouth every evening.   Yes [provider]  ?carvedilol (COREG) 3.125 MG tablet Take 3.125 mg by mouth 2 (two) times daily with a meal.   Yes [provider]  ?clopidogrel (PLAVIX) 75 MG tablet Take 75 mg by mouth daily.   Yes [provider]  ?docusate sodium (COLACE) 100  MG capsule Take 100 mg by mouth daily.   Yes [provider]  ?gabapentin (NEURONTIN) 400 MG capsule Take 400 mg by mouth 4 (four) times daily.   Yes [provider]  ?glipiZIDE (GLUCOTROL) 10 MG tablet Take 10 mg by mouth 2 (two) times daily before a meal.   Yes [provider]  ?Insulin Glargine (BASAGLAR KWIKPEN) 100 UNIT/ML Inject 40 Units into the skin 2 (two) times daily. 07/31/21  Yes [provider]  ?linaclotide Rolan Lipa) 72 MCG capsule Take 1 capsule (72 mcg total) by mouth daily before breakfast. 05/27/16  Yes Annitta Needs, NP  ?lisinopril (PRINIVIL,ZESTRIL) 5 MG tablet Take 5 mg by mouth every morning.    Yes [provider]  ?metFORMIN (GLUCOPHAGE)  1000 MG tablet Take 1,000 mg by mouth 2 (two) times daily. 07/31/21  Yes [provider]  ?methocarbamol (ROBAXIN) 500 MG tablet Take 500 mg by mouth 2 (two) times daily. 05/05/21  Yes [provider]  ?ondansetron (ZOFRAN) 4 MG tablet Take 4 mg by mouth every 6 (six) hours as needed for nausea or vomiting.   Yes [provider]  ?oxyCODONE-acetaminophen (PERCOCET) 5-325 MG tablet Take 1 tablet by mouth every 4 (four) hours as needed for severe pain. 09/05/21 09/05/22 Yes Cherre Robins, MD  ?pantoprazole (PROTONIX) 40 MG tablet Take 1 tablet (40 mg total) by mouth 2 (two) times daily before a meal. ?Patient taking differently: Take 40 mg by mouth daily. 03/12/16  Yes Carlis Stable, NP  ?topiramate (TOPAMAX) 25 MG tablet Take 25 mg by mouth every evening.   Yes [provider]  ? ? ?Physical Exam: ?Vitals:  ? 09/24/21 1430 09/24/21 1500 09/24/21 1530 09/24/21 1600  ?BP: 134/78 113/82 (!) 152/76 (!) 165/70  ?Pulse: 100 97 89 90  ?Resp: (!) 23 (!) 25 (!) 23 (!) 22  ?Temp:    98.8 ?F (37.1 ?C)  ?TempSrc:      ?SpO2: 98% 98% 98% 95%  ?Weight:      ?Height:      ? ?General:  Appears somnolent and in pain but is in NAD ?Eyes:   normal lids ?ENT:  grossly normal lips & tongue, mmm ?Neck:  no LAD, masses or thyromegaly ?Cardiovascular:  RRR, no m/r/g. No LE edema.  ?Respiratory:   CTA bilaterally with no wheezes/rales/rhonchi.  Normal respiratory effort. ?Abdomen:  soft, NT, ND ?Skin:  no rash or induration seen on limited exam ?Musculoskeletal:  L AKA stump is C/D/I; R AKA stump is bandaged ?Psychiatric:  somnolent, moaning and crying out periodically ?Neurologic:  unable to effectively perform ? ? ?Radiological Exams on Admission: ?Independently reviewed - see discussion in A/P where applicable ? ?No results found. ? ?EKG: not done ? ? ?Labs on Admission: I have personally reviewed the available labs and imaging studies at the time of the admission. ? ?Pertinent labs:   ? ?Na++ 131 ?K+  5.5 ?Glucose 422 ?BUN 18/Creatinine 1.63/GFR 38 ?Albumin 3.3 ?WBC 11.8 ?Hgb 11.1 ? ? ? ?Assessment and Plan: ?* PAD (peripheral artery disease) ([place])- (present on admission) ?-Patient with prior L AKA presenting for R AKA after aortogram demonstrated no viable revascularization targets ?-She is now post-op ?-Likely to need placement ?-Continue ASA ?-Management per vascular surgery ? ?Tobacco dependence- (present on admission) ?-Encourage cessation.   ?-Patch ordered  ? ?COPD not affecting current episode of care [facility])- (present on admission) ?-Needs to stop smoking ? ?Hypertension- (present on admission) ?-Continue Coreg ?-Hold Lisinopril and monitor renal  function ?-Will add prn IV hydralazine ? ?Diabetes mellitus ([place]) ?-Will check A1c ?-hold Glucophage, glucotrol ?-Continue glargine ?-Cover with moderate-scale SSI ? ? ? ? ? ? ?Advance Care Planning:   Code Status: Full Code  ? ?Consults: Vascular surgery; CIR; PT/OT/TOC team ? ?DVT Prophylaxis: Heparin ? ?Family Communication: None present; the surgeon was expected to speak with the family post-operatively ? ?Severity of Illness: ?The appropriate patient status for this patient is INPATIENT. Inpatient status is judged to be reasonable and necessary in order to provide the required intensity of service to ensure the patient's safety. The patient's presenting symptoms, physical exam findings, and initial radiographic and laboratory data in the context of their chronic comorbidities is felt to place them at high risk for further clinical deterioration. Furthermore, it is not anticipated that the patient will be medically stable for discharge from the hospital within 2 midnights of admission.  ? ?* I certify that at the point of admission it is my clinical judgment that the patient will require inpatient hospital care spanning beyond 2 midnights from the point of admission due to high intensity of service, high risk for further deterioration and high frequency of  surveillance required.* ? ?Author: ?Karmen Bongo, MD ?09/24/2021 7:08 PM ? ?For on call review [email or web address].  ?

## 2021-09-24 NOTE — Progress Notes (Signed)
Orthopedic Tech Progress Note ?Patient Details:  ?[name] ?1971-01-24 ?[id number] ? ?Patient ID: [name], female   DOB: [birth year], 51 y.o.   MRN: [id number] ?Placer order with [employer] for shrinker. ? ?[name] ?09/24/2021, 10:42 AM ? ?

## 2021-09-24 NOTE — Assessment & Plan Note (Addendum)
-  Needs to stop smoking ?-No shortness of breath or wheezing at this time ?

## 2021-09-24 NOTE — Assessment & Plan Note (Signed)
-  Encourage cessation.   -Patch ordered  

## 2021-09-24 NOTE — Transfer of Care (Signed)
Immediate Anesthesia Transfer of Care Note ? ?Patient: [name] ? ?Procedure(s) Performed: RIGHT ABOVE KNEE AMPUTATION (Right: Knee) ? ?Patient Location: PACU ? ?Anesthesia Type:General ? ?Level of Consciousness: drowsy ? ?Airway & Oxygen Therapy: Patient Spontanous Breathing and Patient connected to face mask oxygen ? ?Post-op Assessment: Report given to RN and Post -op Vital signs reviewed and stable ? ?Post vital signs: Reviewed and stable ? ?Last Vitals:  ?Vitals Value Taken Time  ?BP 100/51 09/24/21 0958  ?Temp    ?Pulse 67 09/24/21 0959  ?Resp 18 09/24/21 0959  ?SpO2 98 % 09/24/21 0959  ?Vitals shown include unvalidated device data. ? ?Last Pain:  ?Vitals:  ? 09/24/21 0721  ?TempSrc:   ?PainSc: 10-Worst pain ever  ?   ? ?Patients Stated Pain Goal: 3 (09/24/21 2563) ? ?Complications: No notable events documented. ?

## 2021-09-25 ENCOUNTER — Encounter (HOSPITAL_COMMUNITY): Payer: Self-pay | Admitting: Vascular Surgery

## 2021-09-25 DIAGNOSIS — E871 Hypo-osmolality and hyponatremia: Secondary | ICD-10-CM | POA: Diagnosis present

## 2021-09-25 DIAGNOSIS — N1831 Chronic kidney disease, stage 3a: Secondary | ICD-10-CM

## 2021-09-25 DIAGNOSIS — E875 Hyperkalemia: Secondary | ICD-10-CM | POA: Insufficient documentation

## 2021-09-25 DIAGNOSIS — D72829 Elevated white blood cell count, unspecified: Secondary | ICD-10-CM

## 2021-09-25 DIAGNOSIS — I70221 Atherosclerosis of native arteries of extremities with rest pain, right leg: Secondary | ICD-10-CM

## 2021-09-25 DIAGNOSIS — N183 Chronic kidney disease, stage 3 unspecified: Secondary | ICD-10-CM | POA: Diagnosis present

## 2021-09-25 DIAGNOSIS — K5900 Constipation, unspecified: Secondary | ICD-10-CM

## 2021-09-25 HISTORY — DX: Chronic kidney disease, stage 3 unspecified: N18.30

## 2021-09-25 LAB — BASIC METABOLIC PANEL
Anion gap: 12 (ref 5–15)
Anion gap: 9 (ref 5–15)
BUN: 18 mg/dL (ref 6–20)
BUN: 18 mg/dL (ref 6–20)
CO2: 17 mmol/L — ABNORMAL LOW (ref 22–32)
CO2: 20 mmol/L — ABNORMAL LOW (ref 22–32)
Calcium: 8.1 mg/dL — ABNORMAL LOW (ref 8.9–10.3)
Calcium: 8 mg/dL — ABNORMAL LOW (ref 8.9–10.3)
Chloride: 99 mmol/L (ref 98–111)
Chloride: 96 mmol/L — ABNORMAL LOW (ref 98–111)
Creatinine, Ser: 1.23 mg/dL — ABNORMAL HIGH (ref 0.44–1.00)
Creatinine, Ser: 1.3 mg/dL — ABNORMAL HIGH (ref 0.44–1.00)
GFR, Estimated: 53 mL/min — ABNORMAL LOW (ref >60)
GFR, Estimated: 49 mL/min — ABNORMAL LOW (ref >60)
Glucose, Bld: 271 mg/dL — ABNORMAL HIGH (ref 70–99)
Glucose, Bld: 137 mg/dL — ABNORMAL HIGH (ref 70–99)
Potassium: 5.7 mmol/L — ABNORMAL HIGH (ref 3.5–5.1)
Potassium: 4.4 mmol/L (ref 3.5–5.1)
Sodium: 128 mmol/L — ABNORMAL LOW (ref 135–145)
Sodium: 125 mmol/L — ABNORMAL LOW (ref 135–145)

## 2021-09-25 LAB — CBC
HCT: 31.7 % — ABNORMAL LOW (ref 36.0–46.0)
Hemoglobin: 10.4 g/dL — ABNORMAL LOW (ref 12.0–15.0)
MCH: 31.7 pg (ref 26.0–34.0)
MCHC: 32.8 g/dL (ref 30.0–36.0)
MCV: 96.6 fL (ref 80.0–100.0)
Platelets: 341 10*3/uL (ref 150–400)
RBC: 3.28 MIL/uL — ABNORMAL LOW (ref 3.87–5.11)
RDW: 14.1 % (ref 11.5–15.5)
WBC: 19.9 10*3/uL — ABNORMAL HIGH (ref 4.0–10.5)
nRBC: 0 % (ref 0.0–0.2)

## 2021-09-25 LAB — GLUCOSE, CAPILLARY
Glucose-Capillary: 275 mg/dL — ABNORMAL HIGH (ref 70–99)
Glucose-Capillary: 208 mg/dL — ABNORMAL HIGH (ref 70–99)
Glucose-Capillary: 156 mg/dL — ABNORMAL HIGH (ref 70–99)
Glucose-Capillary: 117 mg/dL — ABNORMAL HIGH (ref 70–99)

## 2021-09-25 LAB — SURGICAL PATHOLOGY

## 2021-09-25 LAB — MAGNESIUM: Magnesium: 1.5 mg/dL — ABNORMAL LOW (ref 1.7–2.4)

## 2021-09-25 MED ORDER — BISACODYL 10 MG RE SUPP
10.0000 mg | Freq: Once | RECTAL | Status: DC
  Filled 2021-09-25: qty 1

## 2021-09-25 MED ORDER — SODIUM CHLORIDE 0.9 % IV SOLN: INTRAVENOUS | Status: DC

## 2021-09-25 MED ORDER — HYDROMORPHONE 1 MG/ML IV SOLN
INTRAVENOUS | Status: DC
  Administered 2021-09-26: 0.3 mg via INTRAVENOUS
  Administered 2021-09-26: 0 mg via INTRAVENOUS
  Administered 2021-09-26: 0.9 mg via INTRAVENOUS
  Administered 2021-09-26: 4.2 mg via INTRAVENOUS
  Administered 2021-09-26: 3.8 mg via INTRAVENOUS
  Administered 2021-09-26: 0.9 mg via INTRAVENOUS
  Administered 2021-09-27: 2.4 mg via INTRAVENOUS
  Administered 2021-09-27 (×2): 0.9 mg via INTRAVENOUS
  Administered 2021-09-27 – 2021-09-28 (×2): 1.2 mg via INTRAVENOUS
  Administered 2021-09-28: 0.9 mg via INTRAVENOUS
  Administered 2021-09-28: 3.6 mg via INTRAVENOUS
  Administered 2021-09-29: 3 mg via INTRAVENOUS
  Administered 2021-09-29: 2.1 mg via INTRAVENOUS
  Administered 2021-09-29: 4.5 mg via INTRAVENOUS
  Administered 2021-09-29 – 2021-09-30 (×2): 0.3 mg via INTRAVENOUS
  Administered 2021-10-01: 0.9 mg via INTRAVENOUS
  Filled 2021-09-25 (×8): qty 30

## 2021-09-25 MED ORDER — MAGNESIUM SULFATE 4 GM/100ML IV SOLN
4.0000 g | Freq: Once | INTRAVENOUS | Status: DC
Start: 2021-09-25 — End: 2021-09-26
  Filled 2021-09-25: qty 100

## 2021-09-25 MED ORDER — LINACLOTIDE 145 MCG PO CAPS
145.0000 ug | ORAL_CAPSULE | Freq: Every day | ORAL | Status: DC
  Administered 2021-09-26 – 2021-10-02 (×7): 145 ug via ORAL
  Filled 2021-09-25 (×8): qty 1

## 2021-09-25 MED ORDER — DIPHENHYDRAMINE HCL 50 MG/ML IJ SOLN: 12.5000 mg | Freq: Four times a day (QID) | INTRAMUSCULAR | Status: DC | PRN

## 2021-09-25 MED ORDER — DIPHENHYDRAMINE HCL 12.5 MG/5ML PO ELIX: 12.5000 mg | ORAL_SOLUTION | Freq: Four times a day (QID) | ORAL | Status: DC | PRN

## 2021-09-25 MED ORDER — SODIUM CHLORIDE 0.9% FLUSH: 9.0000 mL | INTRAVENOUS | Status: DC | PRN

## 2021-09-25 MED ORDER — HYDROMORPHONE HCL 1 MG/ML IJ SOLN
0.5000 mg | Freq: Once | INTRAMUSCULAR | Status: AC
  Administered 2021-09-25: 0.5 mg via INTRAVENOUS
  Filled 2021-09-25: qty 0.5

## 2021-09-25 MED ORDER — POLYETHYLENE GLYCOL 3350 17 G PO PACK
17.0000 g | PACK | Freq: Every day | ORAL | Status: DC
  Administered 2021-09-26: 17 g via ORAL
  Filled 2021-09-25: qty 1

## 2021-09-25 MED ORDER — ONDANSETRON HCL 4 MG/2ML IJ SOLN: 4.0000 mg | Freq: Four times a day (QID) | INTRAMUSCULAR | Status: DC | PRN

## 2021-09-25 MED ORDER — NALOXONE HCL 0.4 MG/ML IJ SOLN: 0.4000 mg | INTRAMUSCULAR | Status: DC | PRN

## 2021-09-25 MED ORDER — SODIUM ZIRCONIUM CYCLOSILICATE 10 G PO PACK
10.0000 g | PACK | ORAL | Status: AC
Start: 2021-09-25 — End: 2021-09-25
  Administered 2021-09-25: 10 g via ORAL
  Filled 2021-09-25: qty 1

## 2021-09-25 MED ORDER — LIVING WELL WITH DIABETES BOOK
Freq: Once | Status: DC
  Filled 2021-09-25: qty 1

## 2021-09-25 NOTE — Assessment & Plan Note (Signed)
Suspect this is reactive to recent surgery.  She is afebrile and does not appear toxic.  Continue to monitor. ?

## 2021-09-25 NOTE — Progress Notes (Signed)
Pt pulse noted 44, placed pt on tele monitor, apical pulse irregular. Noted PAC on tele monitor HR 80's. MD called.  See new orders  ?

## 2021-09-25 NOTE — Evaluation (Signed)
Physical Therapy Evaluation ?Patient Details ?Name: [name] ?MRN: [id number] ?DOB: [birth year] ?Today's Date: 09/25/2021 ? ?History of Present Illness ? Pt is a 51 y.o. female admitted 09/24/21 for R above knee amputation. PMH includes COPD, DM, HTN, PVD, PE, anxiety, depression, L AKA (pt reports 2021). ?  ?Clinical Impression ? Pt presents with an overall decrease in functional mobility secondary to above. PTA, pt lives alone, mod indep with majority of mobility and ADLs at w/c-level; intermittent assist from brother, weekly aide assist for household tasks. Today, pt requiring assist +1-2 for bed-level mobility, unable to progress to EOB this session secondary to significant R residual limb pain. Pt pleasant and motivated. Pt would benefit from SNF-level therapies to maximize functional mobility and independence prior to return home.     ? ?Recommendations for follow up therapy are one component of a multi-disciplinary discharge planning process, led by the attending physician.  Recommendations may be updated based on patient status, additional functional criteria and insurance authorization. ? ?Follow Up Recommendations Skilled nursing-short term rehab (<3 hours/day) ? ?  ?Assistance Recommended at Discharge Intermittent Supervision/Assistance  ?Patient can return home with the following ? A lot of help with walking and/or transfers;A lot of help with bathing/dressing/bathroom;Help with stairs or ramp for entrance;Assistance with cooking/housework;Assist for transportation ? ?  ?Equipment Recommendations  (defer to next venue)  ?Recommendations for Other Services ?    ?  ?Functional Status Assessment Patient has had a recent decline in their functional status and demonstrates the ability to make significant improvements in function in a reasonable and predictable amount of time.  ? ?  ?Precautions / Restrictions Precautions ?Precautions: Fall  ? ?  ? ?Mobility ? Bed Mobility ?Overal bed mobility: Needs  Assistance ?Bed Mobility: Rolling ?Rolling: Min assist, +2 for physical assistance, +2 for safety/equipment ?  ?  ?  ?  ?General bed mobility comments: rolling R/L multiple times with heavy use of bed rails, minA for hip rotation; pt able to pull self up in bed with use of rails; poor tolerance to R hip extension in bed due to pain. deferred sitting EOB secondary to significant pain ?  ? ?Transfers ?  ?  ?  ?  ?  ?  ?  ?  ?  ?  ?  ? ?Ambulation/Gait ?  ?  ?  ?  ?  ?  ?  ?  ? ?Stairs ?  ?  ?  ?  ?  ? ?Wheelchair Mobility ?  ? ?Modified Rankin (Stroke Patients Only) ?  ? ?  ? ?Balance   ?  ?  ?  ?  ?  ?  ?  ?  ?  ?  ?  ?  ?  ?  ?  ?  ?  ?  ?   ? ? ? ?Pertinent Vitals/Pain Pain Assessment ?Pain Assessment: Faces ?Faces Pain Scale: Hurts whole lot ?Pain Location: R residual limb ?Pain Descriptors / Indicators: Discomfort, Grimacing, Guarding, Moaning ?Pain Intervention(s): Limited activity within patient's tolerance, Monitored during session, Patient requesting pain meds-RN notified  ? ? ?Home Living Family/patient expects to be discharged to:: Private residence ?Living Arrangements: Alone ?Available Help at Discharge: Family;Personal care attendant;Available PRN/intermittently ?Type of Home: Mobile home ?Home Access: Ramped entrance ?  ?  ?  ?Home Layout: One level ?Home Equipment: Wheelchair - manual;BSC/3in1 ?Additional Comments: Lives alone; aide assist M-F for household tasks  ?  ?Prior Function Prior Level of Function : Needs assist ?  ?  ?  ?  ?  ?  ?  Mobility Comments: Typically mod indep with w/c, needs assist to get into front door due to sizing with w/c ?ADLs Comments: Reports mod indep with ADLs, sponge bathing at sink; aide assists daily with household tasks ?  ? ? ?Hand Dominance  ? Dominant Hand: Left ? ?  ?Extremity/Trunk Assessment  ? Upper Extremity Assessment ?Upper Extremity Assessment: Overall WFL for tasks assessed ?  ? ?Lower Extremity Assessment ?Lower Extremity Assessment: RLE deficits/detail;LLE  deficits/detail ?RLE Deficits / Details: s/p R AKA; hip strength at least 3/5 observed strength ?LLE Deficits / Details: h/o L AKA; hip strength at least 3/5 observed strength ?  ? ?   ?Communication  ? Communication: No difficulties  ?Cognition Arousal/Alertness: Awake/alert ?Behavior During Therapy: [facility] for tasks assessed/performed ?Overall Cognitive Status: Within Functional Limits for tasks assessed ?  ?  ?  ?  ?  ?  ?  ?  ?  ?  ?  ?  ?  ?  ?  ?  ?General Comments: anxious regarding anticipation of pain requiring increased time ?  ?  ? ?  ?General Comments General comments (skin integrity, edema, etc.): required assist for bedpan placement and perciare, significant increased time to complete this task secondary to pain ? ?  ?Exercises    ? ?Assessment/Plan  ?  ?PT Assessment Patient needs continued PT services  ?PT Problem List Decreased strength;Decreased balance;Decreased activity tolerance;Decreased range of motion;Decreased mobility;Decreased knowledge of use of DME;Decreased knowledge of precautions;Pain;Obesity ? ?   ?  ?PT Treatment Interventions DME instruction;Functional mobility training;Therapeutic activities;Therapeutic exercise;Balance training;Patient/family education;Wheelchair mobility training   ? ?PT Goals (Current goals can be found in the Care Plan section)  ?Acute Rehab PT Goals ?Patient Stated Goal: return home ?PT Goal Formulation: With patient ?Time For Goal Achievement: 10/09/21 ?Potential to Achieve Goals: Fair ? ?  ?Frequency Min 2X/week ?  ? ? ?Co-evaluation   ?  ?  ?  ?  ? ? ?  ?AM-PAC PT "6 Clicks" Mobility  ?Outcome Measure Help needed turning from your back to your side while in a flat bed without using bedrails?: A Lot ?Help needed moving from lying on your back to sitting on the side of a flat bed without using bedrails?: A Lot ?Help needed moving to and from a bed to a chair (including a wheelchair)?: Total ?Help needed standing up from a chair using your arms (e.g., wheelchair  or bedside chair)?: Total ?Help needed to walk in hospital room?: Total ?Help needed climbing 3-5 steps with a railing? : Total ?6 Click Score: 8 ? ?  ?End of Session   ?Activity Tolerance: Patient limited by pain ?Patient left: in bed;with call bell/phone within reach;with bed alarm set ?Nurse Communication: Mobility status ?PT Visit Diagnosis: Other abnormalities of gait and mobility (R26.89);Pain ?Pain - Right/Left: Right ?Pain - part of body: Leg ?  ? ?Time: 5732-2025 ?PT Time Calculation (min) (ACUTE ONLY): 27 min ? ? ?Charges:   PT Evaluation ?$PT Eval Moderate Complexity: 1 Mod ?  ?  ?   ?Ina Homes, PT, DPT ?Acute Rehabilitation Services  ?Pager [phone] ?Office [phone] ? ? ?[name] ?09/25/2021, 12:09 PM ? ?

## 2021-09-25 NOTE — Progress Notes (Addendum)
Inpatient Rehabilitation Admissions Coordinator  ? ?I met at bedside with patient to discuss rehab venue options. She has been to [facility] after Left BKA and does not want to return there. She lives alone with PCS aide daily. She will likely need SNF, but I await therapy evals to assist with planning venue options. ? ?Danne Baxter, RN, MSN ?Rehab Admissions Coordinator ?[phone] ?09/25/2021 12:11 PM ? ?SNF recommended. We will sign off. ? ?

## 2021-09-25 NOTE — Assessment & Plan Note (Signed)
Holding ACE inhibitor ?Received a dose of Lokelma today ?

## 2021-09-25 NOTE — Assessment & Plan Note (Signed)
Baseline creatinine of 1.2 ?Currently creatinine appears to be near baseline ?Continue to monitor ?

## 2021-09-25 NOTE — Assessment & Plan Note (Addendum)
Chronically on Linzess ?Continue bowel regimen ?

## 2021-09-25 NOTE — Progress Notes (Addendum)
?  Progress Note ? ? ? ?09/25/2021 ?7:16 AM ?1 Day Post-Op ? ?Subjective:  c/o pain and that the sock is really tight ? ? ?Vitals:  ? 09/25/21 0002 09/25/21 0418  ?BP: (!) 141/93 116/71  ?Pulse: 95 81  ?Resp:    ?Temp:    ?SpO2: 93% 97%  ? ? ?Physical Exam: ?Incisions:  shrinker sock in place.  Tight on leg.  ? ? ?CBC ?   ?Component Value Date/Time  ? WBC 19.9 (H) 09/25/2021 0133  ? RBC 3.28 (L) 09/25/2021 0133  ? HGB 10.4 (L) 09/25/2021 0133  ? HCT 31.7 (L) 09/25/2021 0133  ? PLT 341 09/25/2021 0133  ? MCV 96.6 09/25/2021 0133  ? MCH 31.7 09/25/2021 0133  ? MCHC 32.8 09/25/2021 0133  ? RDW 14.1 09/25/2021 0133  ? LYMPHSABS 1.3 01/28/2012 1024  ? MONOABS 0.3 01/28/2012 1024  ? EOSABS 0.0 01/28/2012 1024  ? BASOSABS 0.0 01/28/2012 1024  ? ? ?BMET ?   ?Component Value Date/Time  ? NA 128 (L) 09/25/2021 0133  ? K 5.7 (H) 09/25/2021 0133  ? CL 99 09/25/2021 0133  ? CO2 17 (L) 09/25/2021 0133  ? GLUCOSE 271 (H) 09/25/2021 0133  ? BUN 18 09/25/2021 0133  ? CREATININE 1.23 (H) 09/25/2021 0133  ? CREATININE 1.09 02/21/2014 1729  ? CALCIUM 8.1 (L) 09/25/2021 0133  ? GFRNONAA 53 (L) 09/25/2021 0133  ? GFRAA 77 (L) 09/06/2014 1115  ? ? ?INR ?   ?Component Value Date/Time  ? INR 0.9 09/24/2021 1610  ? ? ? ?Intake/Output Summary (Last 24 hours) at 09/25/2021 0716 ?Last data filed at 09/25/2021 9604 ?Gross per 24 hour  ?Intake 1281.2 ml  ?Output 25 ml  ?Net 1256.2 ml  ? ? ? ?Assessment/Plan:  51 y.o. female is s/p right above knee amputation  1 Day Post-Op ? ?-pt c/o pain.  Vicodin and morphine ordered for pain.   ?-The shrinker sock is tight-will take off sock and order a size bigger ?-dressing to remain in place until pt is discharged or 3/8, which ever comes first. ? ? ?Doreatha Massed, PA-C ?[facility] ?[phone] ?09/25/2021 ?7:16 AM ? ? ?VASCULAR STAFF ADDENDUM: ?I have independently interviewed and examined the patient. ?I agree with the above.  ?Severe pain in the stump continues. ?No evidence of  hematoma. ?Thigh is soft. ?Will order PCA to help get patient comfortable and working with therapy. ?Following along.  ? ?Rande Brunt. Lenell Antu, MD ?[facility] [place] ?Office Phone Number: [phone] ?09/25/2021 3:01 PM ? ? ? ? ? ? ?  ? ?

## 2021-09-25 NOTE — Anesthesia Postprocedure Evaluation (Signed)
Anesthesia Post Note ? ?Patient: [name] ? ?Procedure(s) Performed: RIGHT ABOVE KNEE AMPUTATION (Right: Knee) ? ?  ? ?Patient location during evaluation: PACU ?Anesthesia Type: General ?Level of consciousness: awake and alert ?Pain management: pain level controlled ?Vital Signs Assessment: post-procedure vital signs reviewed and stable ?Respiratory status: spontaneous breathing, nonlabored ventilation, respiratory function stable and patient connected to nasal cannula oxygen ?Cardiovascular status: blood pressure returned to baseline and stable ?Postop Assessment: no apparent nausea or vomiting ?Anesthetic complications: no ? ? ?No notable events documented. ? ?Last Vitals:  ?Vitals:  ? 09/25/21 0418 09/25/21 0746  ?BP: 116/71 (!) 116/58  ?Pulse: 81 (!) 44  ?Resp:  18  ?Temp:  36.6 ?C  ?SpO2: 97% 92%  ?  ?Last Pain:  ?Vitals:  ? 09/25/21 0641  ?TempSrc:   ?PainSc: 5   ? ? ?  ?  ?  ?  ?  ?  ? ?[place] ? ? ? ? ?

## 2021-09-25 NOTE — Progress Notes (Signed)
?Progress Note ? ? ?Patient: [name] [id number] DOB: 1971-06-25 DOA: 09/24/2021     1 ?DOS: the patient was seen and examined on 09/25/2021 ?  ?Brief hospital course: ?51 year old female with a history of peripheral vascular disease status post left AKA, COPD, hypertension and diabetes, is admitted to the hospital for critical limb ischemia of the right lower extremity requiring right above-the-knee palpation.  Patient underwent surgery on 3/1.  Postoperatively, she has been having significant pain and has been started on a PCA.  She is noted to have progressive hyponatremia which is being worked up. ? ?Assessment and Plan: ?* PAD (peripheral artery disease) (HCC) ?-Patient with prior L AKA presenting for R AKA after aortogram demonstrated no viable revascularization targets ?-She is now post-op ?-Seen by PT/OT with recommendations for SNF placement ?-Continue ASA and Plavix ?-Management per vascular surgery ?-She is having significant pain at her operative site today and is being started on a PCA ? ?Hyperkalemia ?Holding ACE inhibitor ?Received a dose of Lokelma today ? ?Hyponatremia ?Unclear etiology ?We will continue on saline for now ?She is not on thiazide ?Check urine sodium, urine osmolality serum osmolality ?Sodium was 135 in 08/2021 ? ?CKD (chronic kidney disease) stage 3, GFR 30-59 ml/min (HCC) ?Baseline creatinine of 1.2 ?Currently creatinine appears to be near baseline ?Continue to monitor ? ?Tobacco dependence ?-Encourage cessation.   ?-Patch ordered  ? ?COPD not affecting current episode of care [facility]) ?-Needs to stop smoking ?-No shortness of breath or wheezing at this time ? ?Constipation ?Chronically on Linzess ?Reports that her last bowel movements was 2 to 3 days prior to admission ?We will continue on Linzess in the hospital ?Add MiraLAX ?We will also give a Dulcolax suppository today ? ?Hypertension ?-Continue Coreg ?-Hold Lisinopril and monitor renal function ?-Will add prn IV  hydralazine ? ?Diabetes mellitus (HCC) ?-A1C 10.0 ?-hold Glucophage, glucotrol ?-Continue glargine ?-Cover with moderate-scale SSI ?-Blood sugars are currently stable ? ? ? ? ? ? ? ?Subjective: Reports continued pain in her operative site which has not been managed with oral pain medicines.  PCA ordered earlier today by vascular surgery.  She has not had a bowel movement for at least 4 to 5 days.  Reports p.o. intake has been poor. ? ?Physical Exam: ?Vitals:  ? 09/25/21 0002 09/25/21 0418 09/25/21 0746 09/25/21 1316  ?BP: (!) 141/93 116/71 (!) 116/58 129/78  ?Pulse: 95 81 (!) 44 98  ?Resp:   18 18  ?Temp:   97.9 ?F (36.6 ?C) 97.6 ?F (36.4 ?C)  ?TempSrc:      ?SpO2: 93% 97% 92% 94%  ?Weight:      ?Height:      ? ?General exam: Alert, awake, oriented x 3 ?Respiratory system: Clear to auscultation. Respiratory effort normal. ?Cardiovascular system:RRR. No murmurs, rubs, gallops. ?Gastrointestinal system: Abdomen is nondistended, soft and nontender. No organomegaly or masses felt. Normal bowel sounds heard. ?Central nervous system: Alert and oriented. No focal neurological deficits. ?Extremities: Bilateral lower extremity amputations ?Skin: No rashes, lesions or ulcers ?Psychiatry: Judgement and insight appear normal. Mood & affect appropriate.  ? ? ?Data Reviewed: ? ?Reviewed CBC, chemistry ? ?Family Communication: Updated family members at the bedside ? ?Disposition: ?Status is: Inpatient ?Remains inpatient appropriate because: Needs continued postoperative care as well as skilled nursing facility placement ? ? ? ? ? ? ? ? ? Planned Discharge Destination: Skilled nursing facility ? ? ? ? ?Time spent: 35 minutes ? ?Author: ?Erick Blinks, MD ?09/25/2021 7:47 PM ? ?For  on call review [email or web address].  ? ?

## 2021-09-25 NOTE — Assessment & Plan Note (Addendum)
Suspected to be from SIADH.  Continue fluid restriction.  Sodium level currently stable at 130 ?

## 2021-09-25 NOTE — Progress Notes (Incomplete)
Pr pule was reported  to been in the low 34-49. MD notified  by amiom. Ne order in ?

## 2021-09-25 NOTE — Hospital Course (Addendum)
51 year old female with a history of peripheral vascular disease status post left AKA, COPD, hypertension and diabetes, is admitted to the hospital for critical limb ischemia of the right lower extremity requiring right above-the-knee palpation.  Patient underwent surgery on 3/1.  Postoperatively, she has been having significant pain and has been started on a PCA.  PCA pump discontinued today.  Waiting for vascular surgery clearance for discharge to skilled nursing facility as recommended by PT/OT ?

## 2021-09-25 NOTE — Evaluation (Signed)
Occupational Therapy Evaluation ?Patient Details ?Name: [name] ?MRN: [id number] ?DOB: [birth year] ?Today's Date: 09/25/2021 ? ? ?History of Present Illness Pt is a 51 y.o. female admitted 09/24/21 for R above knee amputation. PMH includes COPD, DM, HTN, PVD, PE, anxiety, depression, L AKA (pt reports 2021).  ? ?Clinical Impression ?  ?Pt admitted with above. She demonstrates the below listed deficits and will benefit from continued OT to maximize safety and independence with BADLs.  Pt presents to OT with generalized weakness, increased pain, impaired balance, decreased activity tolerance.  She was limited to performing bed mobility with min A +2 for rolling in order to use bedpan secondary to pain.  She requires set  up - mod A for UB ADLs and max - total A for LB ADLs.  Pt reports she was living alone PTA and was w/c dependent.  She was able to perform ADLs mod I, but required assist for IADLs. Anticipate she will require SNF.  ?  ?   ? ?Recommendations for follow up therapy are one component of a multi-disciplinary discharge planning process, led by the attending physician.  Recommendations may be updated based on patient status, additional functional criteria and insurance authorization.  ? ?Follow Up Recommendations ? Skilled nursing-short term rehab (<3 hours/day)  ?  ?Assistance Recommended at Discharge Frequent or constant Supervision/Assistance  ?Patient can return home with the following A lot of help with walking and/or transfers;A lot of help with bathing/dressing/bathroom;Assistance with cooking/housework;Assist for transportation;Help with stairs or ramp for entrance ? ?  ?Functional Status Assessment ? Patient has had a recent decline in their functional status and demonstrates the ability to make significant improvements in function in a reasonable and predictable amount of time.  ?Equipment Recommendations ? Wheelchair cushion (measurements OT)  ?  ?Recommendations for Other Services   ? ? ?   ?Precautions / Restrictions Precautions ?Precautions: Fall  ? ?  ? ?Mobility Bed Mobility ?Overal bed mobility: Needs Assistance ?Bed Mobility: Rolling ?Rolling: Min assist, +2 for physical assistance, +2 for safety/equipment ?  ?  ?  ?  ?General bed mobility comments: rolling R/L multiple times with heavy use of bed rails, minA for hip rotation; pt able to pull self up in bed with use of rails; poor tolerance to R hip extension in bed due to pain. deferred sitting EOB secondary to significant pain ?  ? ?Transfers ?  ?  ?  ?  ?  ?  ?  ?  ?  ?General transfer comment: unable to attempt due to pain ?  ? ?  ?Balance   ?  ?  ?  ?  ?  ?  ?  ?  ?  ?  ?  ?  ?  ?  ?  ?  ?  ?  ?   ? ?ADL either performed or assessed with clinical judgement  ? ?ADL Overall ADL's : Needs assistance/impaired ?Eating/Feeding: Independent ?  ?Grooming: Wash/dry hands;Wash/dry face;Oral care;Brushing hair;Set up;Bed level ?  ?Upper Body Bathing: Set up;Bed level ?  ?Lower Body Bathing: Maximal assistance;Bed level ?  ?Upper Body Dressing : Moderate assistance;Bed level ?  ?Lower Body Dressing: Total assistance;Bed level ?  ?Toilet Transfer: Total assistance ?Toilet Transfer Details (indicate cue type and reason): unable ?Toileting- Clothing Manipulation and Hygiene: Total assistance;Bed level ?  ?  ?  ?Functional mobility during ADLs: +2 for physical assistance;+2 for safety/equipment;Minimal assistance (bed mobility rolling Lt and Rt) ?   ? ? ? ?Vision   ?   ?   ?  Perception   ?  ?Praxis   ?  ? ?Pertinent Vitals/Pain Pain Assessment ?Pain Assessment: Faces ?Faces Pain Scale: Hurts whole lot ?Pain Location: R residual limb ?Pain Descriptors / Indicators: Discomfort, Grimacing, Guarding, Moaning ?Pain Intervention(s): Monitored during session, Limited activity within patient's tolerance, Repositioned  ? ? ? ?Hand Dominance Left ?  ?Extremity/Trunk Assessment Upper Extremity Assessment ?Upper Extremity Assessment: Generalized weakness ?  ?Lower  Extremity Assessment ?Lower Extremity Assessment: Defer to PT evaluation ?RLE Deficits / Details: s/p R AKA; hip strength at least 3/5 observed strength ?LLE Deficits / Details: h/o L AKA; hip strength at least 3/5 observed strength ?  ?  ?  ?Communication Communication ?Communication: No difficulties ?  ?Cognition Arousal/Alertness: Awake/alert ?Behavior During Therapy: Anxious ?Overall Cognitive Status: Within Functional Limits for tasks assessed ?  ?  ?  ?  ?  ?  ?  ?  ?  ?  ?  ?  ?  ?  ?  ?  ?  ?  ?  ?General Comments  required assist for bedpan placement and perciare, significant increased time to complete this task secondary to pain ? ?  ?Exercises   ?  ?Shoulder Instructions    ? ? ?Home Living Family/patient expects to be discharged to:: Private residence ?Living Arrangements: Alone ?Available Help at Discharge: Family;Personal care attendant;Available PRN/intermittently ?Type of Home: Mobile home ?Home Access: Ramped entrance ?  ?  ?Home Layout: One level ?  ?  ?Bathroom Shower/Tub: Walk-in shower ?  ?Bathroom Toilet: Standard ?Bathroom Accessibility: No ?  ?Home Equipment: Wheelchair - manual;BSC/3in1 ?  ?Additional Comments: Lives alone; aide assist M-F for household tasks ?  ? ?  ?Prior Functioning/Environment Prior Level of Function : Needs assist ?  ?  ?  ?  ?  ?  ?Mobility Comments: Typically mod indep with w/c, needs assist to get into front door due to sizing with w/c ?ADLs Comments: Reports mod indep with ADLs, sponge bathing at sink; aide assists daily with household tasks ?  ? ?  ?  ?OT Problem List: Decreased strength;Decreased activity tolerance;Impaired balance (sitting and/or standing);Decreased knowledge of use of DME or AE;Pain ?  ?   ?OT Treatment/Interventions: Self-care/ADL training;Therapeutic exercise;DME and/or AE instruction;Therapeutic activities;Patient/family education;Balance training  ?  ?OT Goals(Current goals can be found in the care plan section) Acute Rehab OT Goals ?Patient  Stated Goal: have less pain ?OT Goal Formulation: With patient ?Time For Goal Achievement: 10/09/21 ?Potential to Achieve Goals: Good  ?OT Frequency: Min 2X/week ?  ? ?Co-evaluation PT/OT/SLP Co-Evaluation/Treatment: Yes ?Reason for Co-Treatment: For patient/therapist safety;To address functional/ADL transfers ?  ?OT goals addressed during session: ADL's and self-care ?  ? ?  ?AM-PAC OT "6 Clicks" Daily Activity     ?Outcome Measure Help from another person eating meals?: None ?Help from another person taking care of personal grooming?: A Little ?Help from another person toileting, which includes using toliet, bedpan, or urinal?: A Lot ?Help from another person bathing (including washing, rinsing, drying)?: A Lot ?Help from another person to put on and taking off regular upper body clothing?: A Lot ?Help from another person to put on and taking off regular lower body clothing?: Total ?6 Click Score: 14 ?  ?End of Session Nurse Communication: Mobility status;Patient requests pain meds ? ?Activity Tolerance: Patient tolerated treatment well ?Patient left: in bed;with call bell/phone within reach ? ?OT Visit Diagnosis: Pain;Muscle weakness (generalized) (M62.81) ?Pain - Right/Left: Right ?Pain - part of body: Leg  ?              ?  Time: [id number] ?OT Time Calculation (min): 27 min ?Charges:  OT General Charges ?$OT Visit: 1 Visit ?OT Evaluation ?$OT Eval Moderate Complexity: 1 Mod ? ?Rajeev Escue C., OTR/L ?Acute Rehabilitation Services ?Pager [phone] ?Office [phone] ? ? ?Lucille Passy M ?09/25/2021, 2:48 PM ?

## 2021-09-26 DIAGNOSIS — E871 Hypo-osmolality and hyponatremia: Secondary | ICD-10-CM

## 2021-09-26 DIAGNOSIS — F17200 Nicotine dependence, unspecified, uncomplicated: Secondary | ICD-10-CM

## 2021-09-26 DIAGNOSIS — I10 Essential (primary) hypertension: Secondary | ICD-10-CM

## 2021-09-26 DIAGNOSIS — J449 Chronic obstructive pulmonary disease, unspecified: Secondary | ICD-10-CM

## 2021-09-26 LAB — BASIC METABOLIC PANEL
Anion gap: 7 (ref 5–15)
BUN: 16 mg/dL (ref 6–20)
CO2: 19 mmol/L — ABNORMAL LOW (ref 22–32)
Calcium: 8.1 mg/dL — ABNORMAL LOW (ref 8.9–10.3)
Chloride: 100 mmol/L (ref 98–111)
Creatinine, Ser: 1.22 mg/dL — ABNORMAL HIGH (ref 0.44–1.00)
GFR, Estimated: 53 mL/min — ABNORMAL LOW (ref >60)
Glucose, Bld: 74 mg/dL (ref 70–99)
Potassium: 4.1 mmol/L (ref 3.5–5.1)
Sodium: 126 mmol/L — ABNORMAL LOW (ref 135–145)

## 2021-09-26 LAB — GLUCOSE, CAPILLARY
Glucose-Capillary: 77 mg/dL (ref 70–99)
Glucose-Capillary: 176 mg/dL — ABNORMAL HIGH (ref 70–99)
Glucose-Capillary: 145 mg/dL — ABNORMAL HIGH (ref 70–99)
Glucose-Capillary: 83 mg/dL (ref 70–99)
Glucose-Capillary: 83 mg/dL (ref 70–99)
Glucose-Capillary: 112 mg/dL — ABNORMAL HIGH (ref 70–99)

## 2021-09-26 LAB — MAGNESIUM: Magnesium: 1.4 mg/dL — ABNORMAL LOW (ref 1.7–2.4)

## 2021-09-26 LAB — CBC
HCT: 29.3 % — ABNORMAL LOW (ref 36.0–46.0)
Hemoglobin: 9.7 g/dL — ABNORMAL LOW (ref 12.0–15.0)
MCH: 31.8 pg (ref 26.0–34.0)
MCHC: 33.1 g/dL (ref 30.0–36.0)
MCV: 96.1 fL (ref 80.0–100.0)
Platelets: 302 10*3/uL (ref 150–400)
RBC: 3.05 MIL/uL — ABNORMAL LOW (ref 3.87–5.11)
RDW: 14.3 % (ref 11.5–15.5)
WBC: 13.1 10*3/uL — ABNORMAL HIGH (ref 4.0–10.5)
nRBC: 0 % (ref 0.0–0.2)

## 2021-09-26 LAB — OSMOLALITY: Osmolality: 268 mOsm/kg — ABNORMAL LOW (ref 275–295)

## 2021-09-26 LAB — SODIUM, URINE, RANDOM: Sodium, Ur: 19 mmol/L

## 2021-09-26 LAB — OSMOLALITY, URINE: Osmolality, Ur: 162 mOsm/kg — ABNORMAL LOW (ref 300–900)

## 2021-09-26 LAB — URIC ACID: Uric Acid, Serum: 5.9 mg/dL (ref 2.5–7.1)

## 2021-09-26 MED ORDER — SENNOSIDES-DOCUSATE SODIUM 8.6-50 MG PO TABS
1.0000 | ORAL_TABLET | Freq: Two times a day (BID) | ORAL | Status: DC
  Administered 2021-09-26 – 2021-10-01 (×12): 1 via ORAL
  Filled 2021-09-26 (×13): qty 1

## 2021-09-26 MED ORDER — POLYETHYLENE GLYCOL 3350 17 G PO PACK
17.0000 g | PACK | Freq: Two times a day (BID) | ORAL | Status: DC
  Administered 2021-09-28 – 2021-10-01 (×7): 17 g via ORAL
  Filled 2021-09-26 (×11): qty 1

## 2021-09-26 MED ORDER — MAGNESIUM SULFATE 4 GM/100ML IV SOLN
4.0000 g | Freq: Once | INTRAVENOUS | Status: AC
  Administered 2021-09-26: 4 g via INTRAVENOUS
  Filled 2021-09-26: qty 100

## 2021-09-26 NOTE — [place] FL2 (Signed)
?[place] MEDICAID FL2 LEVEL OF CARE SCREENING TOOL  ?  ? ?IDENTIFICATION  ?Patient Name: ?[name] Birthdate: 01-Nov-1970 Sex: female Admission Date (Current Location): ?09/24/2021  ?[place] and [place] Number: ? [place] ?[id number] Facility and Address:  ?[facility] [place], [place] [address] [place], [place] [address] ?     Provider Number: ?[id number]  ?Attending Physician Name and Address:  ?Alma Friendly, MD ? Relative Name and Phone Number:  ?Shelton,Shirley Sister [phone]  [phone] ?   ?Current Level of Care: ?Hospital Recommended Level of Care: ?[place] Prior Approval Number: ?  ? ?Date Approved/Denied: ?  PASRR Number: ?  ? ?Discharge Plan: ?SNF ?  ? ?Current Diagnoses: ?Patient Active Problem List  ? Diagnosis Date Noted  ? CKD (chronic kidney disease) stage 3, GFR 30-59 ml/min (HCC) 09/25/2021  ? Hyponatremia 09/25/2021  ? Hyperkalemia 09/25/2021  ? Leukocytosis 09/25/2021  ? PAD (peripheral artery disease) ([place]) 09/24/2021  ? COPD not affecting current episode of care [facility]) 09/24/2021  ? Tobacco dependence 09/24/2021  ? Nausea with vomiting 03/12/2016  ? Dysphagia, pharyngoesophageal phase 08/24/2014  ? Constipation 04/20/2014  ? Liver abscess 02/21/2014  ? RUQ pain 01/31/2014  ? Septic shock(785.52) 02/05/2012  ? Hypokalemia 02/05/2012  ? Anemia 02/05/2012  ? Acute respiratory failure with hypoxia ([place]) 01/28/2012  ? Appendicitis 01/28/2012  ? ARDS (adult respiratory distress syndrome) ([place]) 01/28/2012  ? Diabetes mellitus ([place]) 01/28/2012  ? Asthma 01/28/2012  ? Hypertension 01/28/2012  ? ? ?Orientation RESPIRATION BLADDER Height & Weight   ?  ?Self, Time, Situation, Place ? O2 Continent Weight: 190 lb (86.2 kg) ?Height:  5\' 6"  (167.6 cm)  ?BEHAVIORAL SYMPTOMS/MOOD NEUROLOGICAL BOWEL NUTRITION STATUS  ?    Continent Diet (see discharge summary)  ?AMBULATORY STATUS COMMUNICATION OF NEEDS Skin   ?Total Care Verbally Surgical wounds ?  ?  ?  ?    ?      ?     ? ? ?Personal Care Assistance Level of Assistance  ?Bathing, Feeding, Dressing, Total care Bathing Assistance: Maximum assistance ?Feeding assistance: Independent ?Dressing Assistance: Maximum assistance ?Total Care Assistance: Maximum assistance  ? ?Functional Limitations Info  ?Sight, Hearing, Speech Sight Info: Adequate ?Hearing Info: Adequate ?Speech Info: Adequate  ? ? ?SPECIAL CARE FACTORS FREQUENCY  ?PT (By licensed PT), OT (By licensed OT)   ?  ?PT Frequency: 5x week ?OT Frequency: 5x week ?  ?  ?  ?   ? ? ?Contractures Contractures Info: Not present  ? ? ?Additional Factors Info  ?Code Status, Allergies, Insulin Sliding Scale Code Status Info: full ?Allergies Info: NKA ?  ?Insulin Sliding Scale Info: Novolog 0-15 TID with meals, 0-5 QHS.  See discharge summary. ?  ?   ? ?Current Medications (09/26/2021):  This is the current hospital active medication list ?Current Facility-Administered Medications  ?Medication Dose Route Frequency Provider Last Rate Last Admin  ? 0.9 %  sodium chloride infusion   Intravenous Continuous Kathie Dike, MD 100 mL/hr at 09/26/21 0200 Infusion Verify at 09/26/21 0200  ? acetaminophen (TYLENOL) tablet 325-650 mg  325-650 mg Oral Q6H PRN Baglia, Corrina, PA-C      ? ALPRAZolam Duanne Moron) tablet 0.5 mg  0.5 mg Oral TID PRN Karmen Bongo, MD   0.5 mg at 09/25/21 0006  ? alum & mag hydroxide-simeth (MAALOX/MYLANTA) 200-200-20 MG/5ML suspension 15-30 mL  15-30 mL Oral Q2H PRN Baglia, Corrina, PA-C      ? aspirin EC tablet 81 mg  81 mg Oral Daily Karmen Bongo, MD   81 mg at 09/26/21 [id number]  ? atorvastatin (LIPITOR) tablet 40 mg  40 mg Oral QPM Karmen Bongo, MD   40 mg at 09/25/21 1900  ? bisacodyl (DULCOLAX) EC tablet 5 mg  5 mg Oral Daily PRN Baglia, Corrina, PA-C      ? bisacodyl (DULCOLAX) suppository 10 mg  10 mg Rectal Once Kathie Dike, MD      ? carvedilol (COREG) tablet 3.125 mg  3.125 mg Oral BID WC Karmen Bongo, MD   3.125 mg at 09/26/21 [id number]  ? clopidogrel  (PLAVIX) tablet 75 mg  75 mg Oral Daily Karmen Bongo, MD   75 mg at 09/26/21 [id number]  ? diphenhydrAMINE (BENADRYL) injection 12.5 mg  12.5 mg Intravenous Q6H PRN Cherre Robins, MD      ? Or  ? diphenhydrAMINE (BENADRYL) 12.5 MG/5ML elixir 12.5 mg  12.5 mg Oral Q6H PRN Cherre Robins, MD      ? docusate sodium (COLACE) capsule 100 mg  100 mg Oral Daily Karmen Bongo, MD   100 mg at 09/26/21 0932  ? gabapentin (NEURONTIN) capsule 400 mg  400 mg Oral QID Karmen Bongo, MD   400 mg at 09/26/21 1338  ? guaiFENesin-dextromethorphan (ROBITUSSIN DM) 100-10 MG/5ML syrup 15 mL  15 mL Oral Q4H PRN Baglia, Corrina, PA-C      ? heparin injection 5,000 Units  5,000 Units Subcutaneous Q8H Baglia, Corrina, PA-C   5,000 Units at 09/26/21 1338  ? hydrALAZINE (APRESOLINE) injection 5 mg  5 mg Intravenous Q20 Min PRN Baglia, Corrina, PA-C      ? HYDROcodone-acetaminophen (NORCO) 7.5-325 MG per tablet 1-2 tablet  1-2 tablet Oral Q4H PRN Baglia, Corrina, PA-C   2 tablet at 09/25/21 1439  ? HYDROcodone-acetaminophen (NORCO/VICODIN) 5-325 MG per tablet 1-2 tablet  1-2 tablet Oral Q4H PRN Baglia, Corrina, PA-C   1 tablet at 09/25/21 1906  ? HYDROmorphone (DILAUDID) 1 mg/mL PCA injection   Intravenous Q4H Cherre Robins, MD   Set-up / Initial Syringe at 09/25/21 2016  ? insulin aspart (novoLOG) injection 0-15 Units  0-15 Units Subcutaneous TID WC Karmen Bongo, MD   2 Units at 09/26/21 1242  ? insulin aspart (novoLOG) injection 0-5 Units  0-5 Units Subcutaneous QHS Karmen Bongo, MD   4 Units at 09/24/21 2011  ? insulin glargine-yfgn (SEMGLEE) injection 40 Units  40 Units Subcutaneous BID Karmen Bongo, MD   40 Units at 09/26/21 [id number]  ? labetalol (NORMODYNE) injection 10 mg  10 mg Intravenous Q10 min PRN Baglia, Corrina, PA-C      ? linaclotide (LINZESS) capsule 145 mcg  145 mcg Oral QAC breakfast Kathie Dike, MD   145 mcg at 09/26/21 [id number]  ? living well with diabetes book MISC   Does not apply Once Cherre Robins, MD       ? magnesium sulfate IVPB 2 g 50 mL  2 g Intravenous Daily PRN Baglia, Corrina, PA-C      ? methocarbamol (ROBAXIN) tablet 500 mg  500 mg Oral BID Karmen Bongo, MD   500 mg at 09/26/21 [id number]  ? metoprolol tartrate (LOPRESSOR) injection 2-5 mg  2-5 mg Intravenous Q2H PRN Baglia, Corrina, PA-C      ? naloxone (NARCAN) injection 0.4 mg  0.4 mg Intravenous PRN Cherre Robins, MD      ? And  ? sodium chloride flush (NS) 0.9 % injection 9 mL  9 mL Intravenous PRN Cherre Robins,  MD      ? nicotine (NICODERM CQ - dosed in mg/24 hours) patch 14 mg  14 mg Transdermal Daily Karmen Bongo, MD   14 mg at 09/26/21 [id number]  ? ondansetron (ZOFRAN) injection 4 mg  4 mg Intravenous Q6H PRN Baglia, Corrina, PA-C      ? oxyCODONE-acetaminophen (PERCOCET/ROXICET) 5-325 MG per tablet 1 tablet  1 tablet Oral Q4H PRN Karmen Bongo, MD      ? pantoprazole (PROTONIX) EC tablet 40 mg  40 mg Oral Daily Karmen Bongo, MD   40 mg at 09/26/21 [id number]  ? phenol (CHLORASEPTIC) mouth spray 1 spray  1 spray Mouth/Throat PRN Baglia, Corrina, PA-C      ? polyethylene glycol (MIRALAX / GLYCOLAX) packet 17 g  17 g Oral BID Alma Friendly, MD      ? potassium chloride SA (KLOR-CON M) CR tablet 20-40 mEq  20-40 mEq Oral Daily PRN Baglia, Corrina, PA-C      ? senna-docusate (Senokot-S) tablet 1 tablet  1 tablet Oral BID Alma Friendly, MD   1 tablet at 09/26/21 1242  ? topiramate (TOPAMAX) tablet 25 mg  25 mg Oral QPM Karmen Bongo, MD   25 mg at 09/25/21 1636  ? ? ? ?Discharge Medications: ?Please see discharge summary for a list of discharge medications. ? ?Relevant Imaging Results: ? ?Relevant Lab Results: ? ? ?Additional Information ?SSN: [id number] ? ?Joanne Chars, LCSW ? ? ? ? ?

## 2021-09-26 NOTE — Plan of Care (Signed)

## 2021-09-26 NOTE — TOC Initial Note (Signed)
Transition of Care (TOC) - Initial/Assessment Note  ? ? ?Patient Details  ?Name: [name] ?MRN: [id number] ?Date of Birth: 1970-11-11 ? ?Transition of Care [facility]) CM/SW Contact:    ?Joanne Chars, LCSW ?Phone Number: ?09/26/2021, 4:21 PM ? ?Clinical Narrative:     CSw met with pt regarding DC recommendation for SNF.  Pt drowsy but able to participate in conversation.  Pt agreeable to SNF, permission given to send out referral in hub.  Pt from [facility] but open to [place] or [facility] facilities, does not want [place] rehab.  Permission given to speak with sister Enid Derry and brother Patrick Jupiter. ? ?Passr still [place] to [place] of [place]. ?Referral sent out in hub.              ? ? ?Expected Discharge Plan: [place] ?Barriers to Discharge: Continued Medical Work up, SNF Pending bed offer ? ? ?Patient Goals and CMS Choice ?Patient states their goals for this hospitalization and ongoing recovery are:: no goal stated ?  ?  ? ?Expected Discharge Plan and Services ?Expected Discharge Plan: [place] ?In-house Referral: Clinical Social Work ?  ?Post Acute Care Choice: [facility] ?Living arrangements for the past 2 months: Mobile Home ?                ?  ?  ?  ?  ?  ?  ?  ?  ?  ?  ? ?Prior Living Arrangements/Services ?Living arrangements for the past 2 months: Mobile Home ?Lives with:: Self ?Patient language and need for interpreter reviewed:: Yes ?Do you feel safe going back to the place where you live?: Yes      ?Need for Family Participation in Patient Care: No (Comment) ?Care giver support system in place?: Yes (comment) ?Current home services: Homehealth aide (M-F [phone]) ?Criminal Activity/Legal Involvement Pertinent to Current Situation/Hospitalization: No - Comment as needed ? ?Activities of Daily Living ?Home Assistive Devices/Equipment: Cane (specify quad or straight), CBG Meter, Walker (specify type), Wheelchair, Eyeglasses ?ADL Screening (condition at time of  admission) ?Patient's cognitive ability adequate to safely complete daily activities?: Yes ?Is the patient deaf or have difficulty hearing?: No ?Does the patient have difficulty seeing, even when wearing glasses/contacts?: No ?Does the patient have difficulty concentrating, remembering, or making decisions?: No ?Patient able to express need for assistance with ADLs?: Yes ?Does the patient have difficulty dressing or bathing?: No ?Independently performs ADLs?: Yes (appropriate for developmental age) ?Does the patient have difficulty walking or climbing stairs?: Yes ?Weakness of Legs: Both ?Weakness of Arms/Hands: Both ? ?Permission Sought/Granted ?Permission sought to share information with : Family Supports ?Permission granted to share information with : Yes, Verbal Permission Granted ? Share Information with NAME: sister Enid Derry, brother Patrick Jupiter ? Permission granted to share info w AGENCY: SNF ?   ?   ? ?Emotional Assessment ?Appearance:: Appears older than stated age ?Attitude/Demeanor/Rapport: Other (comment) (sleepy) ?Affect (typically observed): Pleasant ?Orientation: : Oriented to Self, Oriented to Place, Oriented to  Time, Oriented to Situation ?Alcohol / Substance Use: Not Applicable ?Psych Involvement: No (comment) ? ?Admission diagnosis:  PAD (peripheral artery disease) ([place]) [I73.9] ?Patient Active Problem List  ? Diagnosis Date Noted  ? CKD (chronic kidney disease) stage 3, GFR 30-59 ml/min (HCC) 09/25/2021  ? Hyponatremia 09/25/2021  ? Hyperkalemia 09/25/2021  ? Leukocytosis 09/25/2021  ? PAD (peripheral artery disease) ([place]) 09/24/2021  ? COPD not affecting current episode of care [facility]) 09/24/2021  ? Tobacco dependence 09/24/2021  ?  Nausea with vomiting 03/12/2016  ? Dysphagia, pharyngoesophageal phase 08/24/2014  ? Constipation 04/20/2014  ? Liver abscess 02/21/2014  ? RUQ pain 01/31/2014  ? Septic shock(785.52) 02/05/2012  ? Hypokalemia 02/05/2012  ? Anemia 02/05/2012  ? Acute respiratory failure with  hypoxia ([place]) 01/28/2012  ? Appendicitis 01/28/2012  ? ARDS (adult respiratory distress syndrome) ([place]) 01/28/2012  ? Diabetes mellitus ([place]) 01/28/2012  ? Asthma 01/28/2012  ? Hypertension 01/28/2012  ? ?PCP:  Rory Percy, MD ?Pharmacy:   ?[address] ?[place] ?[place] [address] ?Phone: [phone] Fax: [phone] ? ? ? ? ?Social Determinants of Health (SDOH) Interventions ?  ? ?Readmission Risk Interventions ?No flowsheet data found. ? ? ?

## 2021-09-26 NOTE — Progress Notes (Addendum)
?  Progress Note ? ? ? ?09/26/2021 ?9:05 AM ?2 Days Post-Op ? ?Subjective:  pain in right AKA says is 7.5/10 ? ? ?Vitals:  ? 09/26/21 0715 09/26/21 0754  ?BP:  (!) 142/75  ?Pulse:  60  ?Resp: 11 17  ?Temp:  98 ?F (36.7 ?C)  ?SpO2: 99% 99%  ? ?Physical Exam: ?Cardiac:regular  ?Lungs:  non labored ?Incisions:  right AKA dressed ?Neurologic: alert and oriented ? ?CBC ?   ?Component Value Date/Time  ? WBC 13.1 (H) 09/26/2021 0150  ? RBC 3.05 (L) 09/26/2021 0150  ? HGB 9.7 (L) 09/26/2021 0150  ? HCT 29.3 (L) 09/26/2021 0150  ? PLT 302 09/26/2021 0150  ? MCV 96.1 09/26/2021 0150  ? MCH 31.8 09/26/2021 0150  ? MCHC 33.1 09/26/2021 0150  ? RDW 14.3 09/26/2021 0150  ? LYMPHSABS 1.3 01/28/2012 1024  ? MONOABS 0.3 01/28/2012 1024  ? EOSABS 0.0 01/28/2012 1024  ? BASOSABS 0.0 01/28/2012 1024  ? ? ?BMET ?   ?Component Value Date/Time  ? NA 126 (L) 09/26/2021 0150  ? K 4.1 09/26/2021 0150  ? CL 100 09/26/2021 0150  ? CO2 19 (L) 09/26/2021 0150  ? GLUCOSE 74 09/26/2021 0150  ? BUN 16 09/26/2021 0150  ? CREATININE 1.22 (H) 09/26/2021 0150  ? CREATININE 1.09 02/21/2014 1729  ? CALCIUM 8.1 (L) 09/26/2021 0150  ? GFRNONAA 53 (L) 09/26/2021 0150  ? GFRAA 77 (L) 09/06/2014 1115  ? ? ?INR ?   ?Component Value Date/Time  ? INR 0.9 09/24/2021 0086  ? ? ? ?Intake/Output Summary (Last 24 hours) at 09/26/2021 0905 ?Last data filed at 09/26/2021 0200 ?Gross per 24 hour  ?Intake 473.04 ml  ?Output --  ?Net 473.04 ml  ? ? ? ?Assessment/Plan:  51 y.o. female is s/p right AKA 2 Days Post-Op  ? ?Pain control still an issue. Has PCA. Hopefully can start weaning off after today ?Right AKA dressings in place. No swelling or hematoma appreciable ?Dressings will remain in place until 3/8 or prior to d/c  ? ?Graceann Congress, PA-C ?[facility] ?[phone] ?09/26/2021 ?9:05 AM ? ?VASCULAR STAFF ADDENDUM: ?I agree with the above.  ? ?Rande Brunt. Lenell Antu, MD ?[facility] [place] ?Office Phone Number: [phone] ?09/26/2021  3:07 PM ? ? ?

## 2021-09-26 NOTE — Care Management Important Message (Signed)
Important Message ? ?Patient Details  ?Name: [name] ?MRN: [id number] ?Date of Birth: 1971-02-13 ? ? ?Medicare Important Message Given:  Yes ? ? ? ? ?Levada Dy  Yaacov Koziol-Martin ?09/26/2021, 2:25 PM ?

## 2021-09-26 NOTE — Progress Notes (Signed)
?  Progress Note ? ? ?Patient: [name] [id number] DOB: 1970/09/13 DOA: 09/24/2021     2 ?DOS: the patient was seen and examined on 09/26/2021 ?  ?Brief hospital course: ?51 year old female with a history of peripheral vascular disease status post left AKA, COPD, HTN, DM, admitted to the hospital for critical limb ischemia of the right lower extremity requiring right above-the-knee palpation.  Patient underwent surgery on 3/1.  Postoperatively, she has been having significant pain and has been started on a PCA.  She is noted to have progressive hyponatremia which is being worked up. ? ? ?Assessment and Plan: ? ?PAD (peripheral artery disease) (HCC) ?Patient with prior L AKA ?Currently s/p R AKA on 3/1, after aortogram demonstrated no viable revascularization targets ?Continue ASA and Plavix ?Management per vascular surgery ?PT/OT with recommendations for SNF placement ? ?Leukocytosis ?Likely reactive to recent surgery ?Currently afebrile, with no signs of infection ?Daily CBC ? ?Hyponatremia ?Unclear etiology ?Plasma osmolality 268, urine sodium/osmolality not yet collected ?Continue IV fluids for now ?Daily BMP ? ?Hypomagnesemia ?Replace as needed ? ?Normocytic anemia ?Multifactorial, recent surgery, hemodilution with IV fluids ?Anemia panel pending ?Daily CBC ? ?CKD (chronic kidney disease) stage 3a, GFR 30-59 ml/min (HCC) ?Baseline creatinine of 1.2 (from care everyone), currently at baseline ?Daily BMP ? ?Tobacco dependence ?Advised to quit ?Patch ordered  ? ?COPD(HCC) ?Stable ? ?Constipation ?Reports that her last bowel movements was 2 to 3 days prior to admission ?Continue on Linzess, start MiraLAX and senna BID ? ?Hypertension ?Continue Coreg, IV hydralazine prn ?Hold Lisinopril ? ?Diabetes mellitus (HCC) ?A1C 10.0 ?Continue SSI, Semglee, Accu-Cheks, hypoglycemic protocol ?Hold Glucophage, glucotrol ? ?Obesity ?Lifestyle modification advised ? ? ? ?Subjective:  ?Patient denies any new complaints, reports  PCA is improving her pain, but she still feels some pain intermittently.  Patient reports no BM for couple of days.  Denies any chest pain, shortness of breath, abdominal pain, nausea/vomiting, fever/chills. ? ? ? ?Physical Exam: ?Vitals:  ? 09/26/21 0754 09/26/21 1213 09/26/21 1236 09/26/21 1615  ?BP: (!) 142/75  97/65   ?Pulse: 60  90   ?Resp: 17 18 18 20   ?Temp: 98 ?F (36.7 ?C)  98.4 ?F (36.9 ?C)   ?TempSrc:   Oral   ?SpO2: 99% 99% 100% 100%  ?Weight:      ?Height:      ? ?General: NAD  ?Cardiovascular: S1, S2 present ?Respiratory: CTAB ?Abdomen: Soft, nontender, nondistended, bowel sounds present ?Musculoskeletal: BLE AKA amputations, R AKA with wound vac ?Skin: Normal ?Psychiatry: Normal mood  ? ? ? ?Data Reviewed: ? ?Reviewed CBC, chemistry ? ?Family Communication: None at the bedside ? ?Disposition: ?Status is: Inpatient ?Remains inpatient appropriate because: Needs continued postoperative care as well as skilled nursing facility placement ? ? ? ? ? ?Planned Discharge Destination: Skilled nursing facility ? ?DVT Prophylaxis  ., Heparin injection 5,000 units ? ? ? ? ?Author: ? , MD ?09/26/2021 5:08 PM ? ?For on call review [email or web address].  ? ?

## 2021-09-27 LAB — GLUCOSE, CAPILLARY
Glucose-Capillary: 67 mg/dL — ABNORMAL LOW (ref 70–99)
Glucose-Capillary: 108 mg/dL — ABNORMAL HIGH (ref 70–99)
Glucose-Capillary: 168 mg/dL — ABNORMAL HIGH (ref 70–99)
Glucose-Capillary: 190 mg/dL — ABNORMAL HIGH (ref 70–99)
Glucose-Capillary: 167 mg/dL — ABNORMAL HIGH (ref 70–99)
Glucose-Capillary: 173 mg/dL — ABNORMAL HIGH (ref 70–99)

## 2021-09-27 LAB — CBC WITH DIFFERENTIAL/PLATELET
Abs Immature Granulocytes: 0.09 10*3/uL — ABNORMAL HIGH (ref 0.00–0.07)
Basophils Absolute: 0.1 10*3/uL (ref 0.0–0.1)
Basophils Relative: 0 %
Eosinophils Absolute: 0.4 10*3/uL (ref 0.0–0.5)
Eosinophils Relative: 4 %
HCT: 27.4 % — ABNORMAL LOW (ref 36.0–46.0)
Hemoglobin: 9.3 g/dL — ABNORMAL LOW (ref 12.0–15.0)
Immature Granulocytes: 1 %
Lymphocytes Relative: 23 %
Lymphs Abs: 2.6 10*3/uL (ref 0.7–4.0)
MCH: 32.9 pg (ref 26.0–34.0)
MCHC: 33.9 g/dL (ref 30.0–36.0)
MCV: 96.8 fL (ref 80.0–100.0)
Monocytes Absolute: 0.9 10*3/uL (ref 0.1–1.0)
Monocytes Relative: 8 %
Neutro Abs: 7.4 10*3/uL (ref 1.7–7.7)
Neutrophils Relative %: 64 %
Platelets: 292 10*3/uL (ref 150–400)
RBC: 2.83 MIL/uL — ABNORMAL LOW (ref 3.87–5.11)
RDW: 14.3 % (ref 11.5–15.5)
WBC: 11.5 10*3/uL — ABNORMAL HIGH (ref 4.0–10.5)
nRBC: 0 % (ref 0.0–0.2)

## 2021-09-27 LAB — MAGNESIUM: Magnesium: 2.4 mg/dL (ref 1.7–2.4)

## 2021-09-27 LAB — BASIC METABOLIC PANEL
Anion gap: 8 (ref 5–15)
BUN: 17 mg/dL (ref 6–20)
CO2: 18 mmol/L — ABNORMAL LOW (ref 22–32)
Calcium: 7.9 mg/dL — ABNORMAL LOW (ref 8.9–10.3)
Chloride: 99 mmol/L (ref 98–111)
Creatinine, Ser: 1.37 mg/dL — ABNORMAL HIGH (ref 0.44–1.00)
GFR, Estimated: 46 mL/min — ABNORMAL LOW (ref >60)
Glucose, Bld: 113 mg/dL — ABNORMAL HIGH (ref 70–99)
Potassium: 4.4 mmol/L (ref 3.5–5.1)
Sodium: 125 mmol/L — ABNORMAL LOW (ref 135–145)

## 2021-09-27 LAB — IRON AND TIBC
Iron: 18 ug/dL — ABNORMAL LOW (ref 28–170)
Saturation Ratios: 7 % — ABNORMAL LOW (ref 10.4–31.8)
TIBC: 249 ug/dL — ABNORMAL LOW (ref 250–450)
UIBC: 231 ug/dL

## 2021-09-27 LAB — VITAMIN B12: Vitamin B-12: 87 pg/mL — ABNORMAL LOW (ref 180–914)

## 2021-09-27 LAB — FOLATE: Folate: 5 ng/mL — ABNORMAL LOW (ref >5.9)

## 2021-09-27 LAB — FERRITIN: Ferritin: 72 ng/mL (ref 11–307)

## 2021-09-27 MED ORDER — GABAPENTIN 400 MG PO CAPS
400.0000 mg | ORAL_CAPSULE | Freq: Three times a day (TID) | ORAL | Status: DC
  Administered 2021-09-27 – 2021-10-02 (×15): 400 mg via ORAL
  Filled 2021-09-27 (×15): qty 1

## 2021-09-27 MED ORDER — FOLIC ACID 1 MG PO TABS
1.0000 mg | ORAL_TABLET | Freq: Every day | ORAL | Status: DC
Start: 2021-09-27 — End: 2021-10-03
  Administered 2021-09-27 – 2021-10-02 (×6): 1 mg via ORAL
  Filled 2021-09-27 (×6): qty 1

## 2021-09-27 MED ORDER — FERROUS SULFATE 325 (65 FE) MG PO TABS
325.0000 mg | ORAL_TABLET | Freq: Every day | ORAL | Status: DC
Start: 2021-09-27 — End: 2021-10-03
  Administered 2021-09-27 – 2021-10-02 (×6): 325 mg via ORAL
  Filled 2021-09-27 (×6): qty 1

## 2021-09-27 MED ORDER — VITAMIN B-12 1000 MCG PO TABS
1000.0000 ug | ORAL_TABLET | Freq: Every day | ORAL | Status: DC
  Administered 2021-09-27 – 2021-10-02 (×6): 1000 ug via ORAL
  Filled 2021-09-27 (×6): qty 1

## 2021-09-27 NOTE — Progress Notes (Signed)
?  Progress Note ? ? ?Patient: [name] [id number]:[id number] DOB: [birth year] DOA: 09/24/2021     3 ?DOS: the patient was seen and examined on 09/27/2021 ?  ?Brief hospital course: ?51 year old female with a history of peripheral vascular disease status post left AKA, COPD, HTN, DM, admitted to the hospital for critical limb ischemia of the right lower extremity requiring right above-the-knee palpation.  Patient underwent surgery on 3/1. Postoperatively, she has been having significant pain and has been started on a PCA. She is noted to have progressive hyponatremia which is being worked up. ? ? ?Assessment and Plan: ? ?PAD (peripheral artery disease) (HCC) ?Patient with prior L AKA ?Currently s/p R AKA on 3/1, after aortogram demonstrated no viable revascularization targets ?Continue ASA and Plavix ?Management per vascular surgery ?PT/OT with recommendations for SNF placement ? ?Leukocytosis ?Likely reactive to recent surgery ?Currently afebrile, with no signs of infection ?Daily CBC ? ?Hyponatremia ?Unclear etiology, ?SIADH ?Plasma osmolality 268, urine sodium 19, urine osmolality 162 ?Hold IV fluids for now as Na is not improving ?Daily BMP ? ?Hypomagnesemia ?Replace as needed ? ?Iron def/Vitamin B12 def/folic acid def anemia ?Multifactorial, recent surgery, hemodilution with IV fluids, iron def ?Anemia panel iron 18, TIBC 249, sat 7%, ferritin 72, folate 5, vitamin B12-->87 ?Start oral iron, vitamin B12, folic acid supplementation ?Daily CBC ? ?CKD (chronic kidney disease) stage 3a, GFR 30-59 ml/min (HCC) ?Baseline creatinine of 1.2 (from care everyone), around baseline ?Daily BMP ? ?Tobacco dependence ?Advised to quit ?Patch ordered  ? ?COPD(HCC) ?Stable ? ?Constipation ?Reports that her last bowel movements was 2 to 3 days prior to admission ?Continue on Linzess, start MiraLAX and senna BID ? ?Hypertension ?Continue Coreg, IV hydralazine prn ?Hold Lisinopril ? ?Diabetes mellitus (HCC) ?A1C 10.0 ?Continue SSI,  Semglee, Accu-Cheks, hypoglycemic protocol ?Hold Glucophage, glucotrol ? ?Obesity ?Lifestyle modification advised ? ? ? ?Subjective:  ?Today, patient noted to be sleepy, when asked her pain level she reports 8/10 while on PCA.  Denies any other new complaints. ? ? ? ?Physical Exam: ?Vitals:  ? 09/27/21 0800 09/27/21 1320 09/27/21 1323 09/27/21 1516  ?BP:    115/72  ?Pulse:    (!) 44  ?Resp: 18 18 20 17   ?Temp:    97.7 ?F (36.5 ?C)  ?TempSrc:    Oral  ?SpO2: 100% 96% 100% 100%  ?Weight:      ?Height:      ? ?General: NAD, sleepy ?Cardiovascular: S1, S2 present ?Respiratory: CTAB ?Abdomen: Soft, nontender, nondistended, bowel sounds present ?Musculoskeletal: BLE AKA amputations, R AKA with wound vac ?Skin: Normal ?Psychiatry: Normal mood  ? ? ? ?Data Reviewed: ? ?Reviewed CBC, chemistry ? ?Family Communication: None at the bedside ? ?Disposition: ?Status is: Inpatient ?Remains inpatient appropriate because: Needs continued postoperative care as well as skilled nursing facility placement ? ? ? ? ? ?Planned Discharge Destination: Skilled nursing facility ? ?DVT Prophylaxis  ., Heparin injection 5,000 units ? ? ? ? ?Author: ? , MD ?09/27/2021 4:35 PM ? ?For on call review [email or web address]  ? ?

## 2021-09-28 LAB — CBC WITH DIFFERENTIAL/PLATELET
Abs Immature Granulocytes: 0.11 10*3/uL — ABNORMAL HIGH (ref 0.00–0.07)
Basophils Absolute: 0.1 10*3/uL (ref 0.0–0.1)
Basophils Relative: 1 %
Eosinophils Absolute: 0.5 10*3/uL (ref 0.0–0.5)
Eosinophils Relative: 5 %
HCT: 27.4 % — ABNORMAL LOW (ref 36.0–46.0)
Hemoglobin: 8.8 g/dL — ABNORMAL LOW (ref 12.0–15.0)
Immature Granulocytes: 1 %
Lymphocytes Relative: 25 %
Lymphs Abs: 2.8 10*3/uL (ref 0.7–4.0)
MCH: 31.7 pg (ref 26.0–34.0)
MCHC: 32.1 g/dL (ref 30.0–36.0)
MCV: 98.6 fL (ref 80.0–100.0)
Monocytes Absolute: 0.8 10*3/uL (ref 0.1–1.0)
Monocytes Relative: 7 %
Neutro Abs: 6.7 10*3/uL (ref 1.7–7.7)
Neutrophils Relative %: 61 %
Platelets: 312 10*3/uL (ref 150–400)
RBC: 2.78 MIL/uL — ABNORMAL LOW (ref 3.87–5.11)
RDW: 14.4 % (ref 11.5–15.5)
WBC: 10.9 10*3/uL — ABNORMAL HIGH (ref 4.0–10.5)
nRBC: 0 % (ref 0.0–0.2)

## 2021-09-28 LAB — BASIC METABOLIC PANEL
Anion gap: 6 (ref 5–15)
BUN: 17 mg/dL (ref 6–20)
CO2: 20 mmol/L — ABNORMAL LOW (ref 22–32)
Calcium: 8.1 mg/dL — ABNORMAL LOW (ref 8.9–10.3)
Chloride: 97 mmol/L — ABNORMAL LOW (ref 98–111)
Creatinine, Ser: 1.26 mg/dL — ABNORMAL HIGH (ref 0.44–1.00)
GFR, Estimated: 51 mL/min — ABNORMAL LOW (ref >60)
Glucose, Bld: 126 mg/dL — ABNORMAL HIGH (ref 70–99)
Potassium: 5.1 mmol/L (ref 3.5–5.1)
Sodium: 123 mmol/L — ABNORMAL LOW (ref 135–145)

## 2021-09-28 LAB — GLUCOSE, CAPILLARY
Glucose-Capillary: 86 mg/dL (ref 70–99)
Glucose-Capillary: 117 mg/dL — ABNORMAL HIGH (ref 70–99)
Glucose-Capillary: 162 mg/dL — ABNORMAL HIGH (ref 70–99)
Glucose-Capillary: 179 mg/dL — ABNORMAL HIGH (ref 70–99)

## 2021-09-28 LAB — SODIUM
Sodium: 127 mmol/L — ABNORMAL LOW (ref 135–145)
Sodium: 125 mmol/L — ABNORMAL LOW (ref 135–145)

## 2021-09-28 LAB — URIC ACID: Uric Acid, Serum: 6.1 mg/dL (ref 2.5–7.1)

## 2021-09-28 LAB — OSMOLALITY: Osmolality: 269 mOsm/kg — ABNORMAL LOW (ref 275–295)

## 2021-09-28 NOTE — Progress Notes (Signed)
?  Progress Note ? ? ?Patient: [name] [id number] DOB: Aug 19, 1970 DOA: 09/24/2021     4 ?DOS: the patient was seen and examined on 09/28/2021 ?  ?Brief hospital course: ?51 year old female with a history of peripheral vascular disease status post left AKA, COPD, HTN, DM, admitted to the hospital for critical limb ischemia of the right lower extremity requiring right above-the-knee palpation.  Patient underwent surgery on 3/1. Postoperatively, she has been having significant pain and has been started on a PCA. She is noted to have progressive hyponatremia which is being worked up. ? ? ?Assessment and Plan: ? ?PAD (peripheral artery disease) (HCC) ?Patient with prior L AKA ?Currently s/p R AKA on 3/1, after aortogram demonstrated no viable revascularization targets ?Continue ASA and Plavix ?Management per vascular surgery ?PT/OT with recommendations for SNF placement ? ?Leukocytosis ?Likely reactive to recent surgery ?Currently afebrile, with no signs of infection ?Daily CBC ? ?Hyponatremia ?Unclear etiology, ?SIADH ?Plasma osmolality 268, urine sodium 19, urine osmolality 162 ?Nephrology consulted, 1.5 L fluid restriction ?Trend sodium level ?Daily BMP ? ?Hypomagnesemia ?Replace as needed ? ?Iron def/Vitamin B12 def/folic acid def anemia ?Multifactorial, recent surgery, hemodilution with IV fluids, iron def ?Anemia panel iron 18, TIBC 249, sat 7%, ferritin 72, folate 5, vitamin B12-->87 ?Start oral iron, vitamin B12, folic acid supplementation ?Daily CBC ? ?CKD (chronic kidney disease) stage 3a, GFR 30-59 ml/min (HCC) ?Baseline creatinine of 1.2 (from care everyone), around baseline ?Daily BMP ? ?Tobacco dependence ?Advised to quit ?Patch ordered  ? ?COPD(HCC) ?Stable ? ?Constipation ?Continue on Linzess, start MiraLAX and senna BID ? ?Hypertension ?Continue Coreg, IV hydralazine prn ?Hold Lisinopril ? ?Diabetes mellitus (HCC) ?A1C 10.0 ?Continue SSI, Semglee, Accu-Cheks, hypoglycemic protocol ?Hold Glucophage,  glucotrol ? ?Obesity ?Lifestyle modification advised ? ? ? ?Subjective:  ?Today, patient more alert awake, still complaining of postop pain, otherwise denies any new complaints. ? ? ? ?Physical Exam: ?Vitals:  ? 09/28/21 0800 09/28/21 1342 09/28/21 1449 09/28/21 1622  ?BP:   109/80   ?Pulse:   90   ?Resp: 17 20 17 17   ?Temp:   98 ?F (36.7 ?C)   ?TempSrc:   Oral   ?SpO2: 100% 98% 99% 97%  ?Weight:      ?Height:      ? ?General: NAD ?Cardiovascular: S1, S2 present ?Respiratory: CTAB ?Abdomen: Soft, nontender, nondistended, bowel sounds present ?Musculoskeletal: BLE AKA amputations, R AKA with wound vac ?Skin: Normal ?Psychiatry: Normal mood  ? ? ? ?Data Reviewed: ? ?Reviewed CBC, chemistry ? ?Family Communication: None at the bedside ? ?Disposition: ?Status is: Inpatient ?Remains inpatient appropriate because: Needs continued postoperative care as well as skilled nursing facility placement ? ? ? ? ? ?Planned Discharge Destination: Skilled nursing facility ? ?DVT Prophylaxis  ., Heparin injection 5,000 units ? ? ? ? ?Author: ? , MD ?09/28/2021 6:20 PM ? ?For on call review [email or web address]  ? ?

## 2021-09-28 NOTE — Consult Note (Signed)
[name] Admit Date: 09/24/2021 09/28/2021 Rexene Agent Requesting Physician:  Horris Latino MD  Reason for Consult:  Hyponatremia HPI:  20F PMH including PVD with history of left AKA, COPD, hypertension, DM 2, tobacco use, CKD 3 admitted after presenting on 3/1 with findings consistent with critical ischemia of the left leg with no revascularization options.  She underwent right AKA on 3/1.  She has had significant pain control issues postoperatively.  Trend in serum sodium as below.  Sodium  Date Value  09/28/2021 123 mmol/L (L)  09/27/2021 125 mmol/L (L)  09/26/2021 126 mmol/L (L)  09/25/2021 125 mmol/L (L)  09/25/2021 128 mmol/L (L)  09/24/2021 131 mmol/L (L)  09/05/2021 135 mmol/L  09/06/2014 134 mmol/L (L)  02/21/2014 138 mEq/L  02/04/2012 139 mEq/L  ]  Latest Reference Range & Units 09/26/21 23:26  Osmolality, Urine 300 - 900 mOsm/kg 162 (L)  Sodium, Urine mmol/L 19  (L): Data is abnormally low   Latest Reference Range & Units 09/28/21 12:50  Osmolality 275 - 295 mOsm/kg 269 (L)  (L): Data is abnormally low  She received IV fluids, with a feeling that she had hypovolemic hyponatremia but serum sodium has worsened to 123 today.  Blood pressures are stable.  She is eating well, hungry at the time of my evaluation.  She has several cups and soda cans on the bedside tray.  Urine output yesterday 2.4 L, 0.7 L reported already today.  ROS Denies headache, vision changes, nausea/vomiting, confusion  Balance of 12 systems is negative w/ exceptions as above  PMH  Past Medical History:  Diagnosis Date   Anemia 02/05/2012   Anginal pain (HCC)    Anxiety    Arthritis    Asthma    CKD (chronic kidney disease) stage 3, GFR 30-59 ml/min ([place]) 09/25/2021   COPD (chronic obstructive pulmonary disease) ([place])    Depression    Diabetes mellitus ([place]) 01/28/2012   Fatty liver    GERD (gastroesophageal reflux disease)    Headache(784.0)    Hypertension    PE (pulmonary  embolism)    Xarelto   Peripheral vascular disease ([place])    Pneumonia 2013   Sepsis ([place])    Shortness of breath    Tuberculosis    pt denies   PSH  Past Surgical History:  Procedure Laterality Date   ABDOMINAL AORTOGRAM W/LOWER EXTREMITY N/A 09/05/2021   Procedure: ABDOMINAL AORTOGRAM W/LOWER EXTREMITY;  Surgeon: Cherre Robins, MD;  Location: [facility];  Service: Cardiovascular;  Laterality: N/A;   AMPUTATION Right 09/24/2021   Procedure: RIGHT ABOVE KNEE AMPUTATION;  Surgeon: Cherre Robins, MD;  Location: [facility] OR;  Service: Vascular;  Laterality: Right;   APPENDECTOMY     CHOLECYSTECTOMY     DIAGNOSTIC LAPAROSCOPY     ESOPHAGEAL DILATION N/A 10/09/2014   Procedure: ESOPHAGEAL DILATION;  Surgeon: Danie Binder, MD;  Location: [facility];  Service: Endoscopy;  Laterality: N/A;  #15 and #16 savory   ESOPHAGOGASTRODUODENOSCOPY (EGD) WITH PROPOFOL N/A 10/09/2014   Dr. Oneida Alar: mild erosive gastritis, Savary dilation. Nausea and vomiting most likely due to uncontroleld blood sugars, reflux, and gastritis.    FH  Family History  Problem Relation Age of Onset   Colon cancer Neg Hx    Liver disease Neg Hx    SH  reports that she has been smoking cigarettes. She has a 20.00 pack-year smoking history. She has never used smokeless tobacco. She reports that she does not drink alcohol and  does not use drugs. Allergies No Active Allergies Home medications Prior to Admission medications   Medication Sig Start Date End Date Taking? Authorizing Provider  ALPRAZolam Duanne Moron) 0.5 MG tablet Take 0.5 mg by mouth 3 (three) times daily as needed for anxiety.   Yes [provider]  aspirin EC 81 MG tablet Take 81 mg by mouth daily. Swallow whole.   Yes [provider]  atorvastatin (LIPITOR) 40 MG tablet Take 40 mg by mouth every evening.   Yes [provider]  carvedilol (COREG) 3.125 MG tablet Take 3.125 mg by mouth 2 (two) times daily with a meal.   Yes [provider]  clopidogrel (PLAVIX) 75 MG tablet Take 75 mg by mouth daily.   Yes [provider]  docusate sodium (COLACE) 100 MG capsule Take 100 mg by mouth daily.   Yes [provider]  gabapentin (NEURONTIN) 400 MG capsule Take 400 mg by mouth 4 (four) times daily.   Yes [provider]  glipiZIDE (GLUCOTROL) 10 MG tablet Take 10 mg by mouth 2 (two) times daily before a meal.   Yes [provider]  Insulin Glargine (BASAGLAR KWIKPEN) 100 UNIT/ML Inject 40 Units into the skin 2 (two) times daily. 07/31/21  Yes [provider]  linaclotide Rolan Lipa) 72 MCG capsule Take 1 capsule (72 mcg total) by mouth daily before breakfast. 05/27/16  Yes Annitta Needs, NP  lisinopril (PRINIVIL,ZESTRIL) 5 MG tablet Take 5 mg by mouth every morning.    Yes [provider]  metFORMIN (GLUCOPHAGE) 1000 MG tablet Take 1,000 mg by mouth 2 (two) times daily. 07/31/21  Yes [provider]  methocarbamol (ROBAXIN) 500 MG tablet Take 500 mg by mouth 2 (two) times daily. 05/05/21  Yes [provider]  ondansetron (ZOFRAN) 4 MG tablet Take 4 mg by mouth every 6 (six) hours as needed for nausea or vomiting.   Yes [provider]  oxyCODONE-acetaminophen (PERCOCET) 5-325 MG tablet Take 1 tablet by mouth every 4 (four) hours as needed for severe pain. 09/05/21 09/05/22 Yes Cherre Robins, MD  pantoprazole (PROTONIX) 40 MG tablet Take 1 tablet (40 mg total) by mouth 2 (two) times daily before a meal. Patient taking differently: Take 40 mg by mouth daily. 03/12/16  Yes Carlis Stable, NP  topiramate (TOPAMAX) 25 MG tablet Take 25 mg by mouth every evening.   Yes [provider]    Current Medications Scheduled Meds:  aspirin EC  81 mg Oral Daily   atorvastatin  40 mg Oral QPM   bisacodyl  10 mg Rectal Once   carvedilol  3.125 mg Oral BID WC   clopidogrel  75 mg Oral Daily   docusate sodium  100 mg Oral Daily   ferrous sulfate  325 mg Oral  Q breakfast   folic acid  1 mg Oral Daily   gabapentin  400 mg Oral TID   heparin  5,000 Units Subcutaneous Q8H   HYDROmorphone   Intravenous Q4H   insulin aspart  0-15 Units Subcutaneous TID WC   insulin aspart  0-5 Units Subcutaneous QHS   insulin glargine-yfgn  40 Units Subcutaneous BID   linaclotide  145 mcg Oral QAC breakfast   living well with diabetes book   Does not apply Once   methocarbamol  500 mg Oral BID   nicotine  14 mg Transdermal Daily   pantoprazole  40 mg Oral Daily   polyethylene glycol  17 g Oral BID  senna-docusate  1 tablet Oral BID   topiramate  25 mg Oral QPM   vitamin B-12  1,000 mcg Oral Daily   Continuous Infusions:  magnesium sulfate bolus IVPB     PRN Meds:.acetaminophen, ALPRAZolam, alum & mag hydroxide-simeth, bisacodyl, diphenhydrAMINE **OR** diphenhydrAMINE, guaiFENesin-dextromethorphan, hydrALAZINE, HYDROcodone-acetaminophen, HYDROcodone-acetaminophen, labetalol, magnesium sulfate bolus IVPB, metoprolol tartrate, naloxone **AND** sodium chloride flush, ondansetron, oxyCODONE-acetaminophen, phenol, potassium chloride  CBC Recent Labs  Lab 09/26/21 0150 09/27/21 0206 09/28/21 0135  WBC 13.1* 11.5* 10.9*  NEUTROABS  --  7.4 6.7  HGB 9.7* 9.3* 8.8*  HCT 29.3* 27.4* 27.4*  MCV 96.1 96.8 98.6  PLT 302 292 [id number]   Basic Metabolic Panel Recent Labs  Lab 09/24/21 0652 09/25/21 0133 09/25/21 1652 09/26/21 0150 09/27/21 0206 09/28/21 0135  NA 131* 128* 125* 126* 125* 123*  K 5.5* 5.7* 4.4 4.1 4.4 5.1  CL 101 99 96* 100 99 97*  CO2 20* 17* 20* 19* 18* 20*  GLUCOSE 422* 271* 137* 74 113* 126*  BUN 18 18 18 16 17 17   CREATININE 1.63* 1.23* 1.30* 1.22* 1.37* 1.26*  CALCIUM 8.6* 8.1* 8.0* 8.1* 7.9* 8.1*    Physical Exam   Blood pressure 130/73, pulse (!) 58, temperature 98.3 F (36.8 C), temperature source Oral, resp. rate 20, height 5\' 6"  (1.676 m), weight 86.2 kg, SpO2 98 %. GEN: NAD, sitting up in bed, eating lunch ENT: NCAT EYES:  EOMI CV: Regular, normal S1 and S2 PULM: Clear bilaterally ABD: Soft nontender SKIN: No rashes or lesions, right AKA site bandaged EXT: Left AKA without edema  Assessment 56F progressive asymptomatic hyponatremia after right AKA worsened with fluid challenge; CKD3  Euvolemic hypotonic hyponatremia: Most likely this is SIADH driven by pain.  Her urine indices did not suggest a significant concentration defect, so I think she also has quite a bit of ad lib. fluid intake.  She is asymptomatic, no indication for hypertonic saline.  Hopefully with fluid restriction this will improve. CKD 3, at baseline PVD status post right AKA 09/24/2021 Postoperative pain, on PCA DM2 COPD Tobacco user  Plan 1.5 L restriction of all fluids per day Trend serum sodium every 6 hours Repeat urine osmolality and urine sodium Daily weights, Daily Renal Panel, Strict I/Os, Avoid nephrotoxins (NSAIDs, judicious IV Contrast)    Rexene Agent  09/28/2021, 2:06 PM

## 2021-09-29 LAB — CBC WITH DIFFERENTIAL/PLATELET
Abs Immature Granulocytes: 0.12 10*3/uL — ABNORMAL HIGH (ref 0.00–0.07)
Basophils Absolute: 0.1 10*3/uL (ref 0.0–0.1)
Basophils Relative: 1 %
Eosinophils Absolute: 0.5 10*3/uL (ref 0.0–0.5)
Eosinophils Relative: 4 %
HCT: 25.4 % — ABNORMAL LOW (ref 36.0–46.0)
Hemoglobin: 8.4 g/dL — ABNORMAL LOW (ref 12.0–15.0)
Immature Granulocytes: 1 %
Lymphocytes Relative: 22 %
Lymphs Abs: 2.5 10*3/uL (ref 0.7–4.0)
MCH: 31.8 pg (ref 26.0–34.0)
MCHC: 33.1 g/dL (ref 30.0–36.0)
MCV: 96.2 fL (ref 80.0–100.0)
Monocytes Absolute: 1 10*3/uL (ref 0.1–1.0)
Monocytes Relative: 9 %
Neutro Abs: 7.2 10*3/uL (ref 1.7–7.7)
Neutrophils Relative %: 63 %
Platelets: 308 10*3/uL (ref 150–400)
RBC: 2.64 MIL/uL — ABNORMAL LOW (ref 3.87–5.11)
RDW: 14.4 % (ref 11.5–15.5)
WBC: 11.4 10*3/uL — ABNORMAL HIGH (ref 4.0–10.5)
nRBC: 0 % (ref 0.0–0.2)

## 2021-09-29 LAB — BASIC METABOLIC PANEL
Anion gap: 8 (ref 5–15)
BUN: 20 mg/dL (ref 6–20)
CO2: 20 mmol/L — ABNORMAL LOW (ref 22–32)
Calcium: 8.3 mg/dL — ABNORMAL LOW (ref 8.9–10.3)
Chloride: 97 mmol/L — ABNORMAL LOW (ref 98–111)
Creatinine, Ser: 1.3 mg/dL — ABNORMAL HIGH (ref 0.44–1.00)
GFR, Estimated: 49 mL/min — ABNORMAL LOW (ref >60)
Glucose, Bld: 63 mg/dL — ABNORMAL LOW (ref 70–99)
Potassium: 4.9 mmol/L (ref 3.5–5.1)
Sodium: 125 mmol/L — ABNORMAL LOW (ref 135–145)

## 2021-09-29 LAB — GLUCOSE, CAPILLARY
Glucose-Capillary: 89 mg/dL (ref 70–99)
Glucose-Capillary: 63 mg/dL — ABNORMAL LOW (ref 70–99)
Glucose-Capillary: 146 mg/dL — ABNORMAL HIGH (ref 70–99)
Glucose-Capillary: 106 mg/dL — ABNORMAL HIGH (ref 70–99)
Glucose-Capillary: 97 mg/dL (ref 70–99)

## 2021-09-29 LAB — SODIUM
Sodium: 128 mmol/L — ABNORMAL LOW (ref 135–145)
Sodium: 126 mmol/L — ABNORMAL LOW (ref 135–145)
Sodium: 128 mmol/L — ABNORMAL LOW (ref 135–145)

## 2021-09-29 MED ORDER — INSULIN ASPART 100 UNIT/ML IJ SOLN
0.0000 [IU] | Freq: Three times a day (TID) | INTRAMUSCULAR | Status: DC
  Administered 2021-09-30: 1 [IU] via SUBCUTANEOUS
  Administered 2021-10-01 (×2): 2 [IU] via SUBCUTANEOUS
  Administered 2021-10-02: 13:00:00 1 [IU] via SUBCUTANEOUS

## 2021-09-29 NOTE — Progress Notes (Signed)
Physical Therapy Treatment ?Patient Details ?Name: [name] ?MRN: [id number] ?DOB: 07-Sep-1970 ?Today's Date: 09/29/2021 ? ? ?History of Present Illness Pt is a 51 y.o. female admitted 09/24/21 for R above knee amputation. PMH includes COPD, DM, HTN, PVD, PE, anxiety, depression, L AKA (pt reports 2021). ? ?  ?PT Comments  ? ?  Focus of PT session was on scooting in preparation for transfer OOB, pt complaining of significant pain but effectively scoots to/from EOB with light assist and mod cuing. Pt requiring very increased time for mobility today, frequently stalls and requires redirecting cues. PT to continue to follow, will progress mobility as able.  ?  ?Recommendations for follow up therapy are one component of a multi-disciplinary discharge planning process, led by the attending physician.  Recommendations may be updated based on patient status, additional functional criteria and insurance authorization. ? ?Follow Up Recommendations ? Skilled nursing-short term rehab (<3 hours/day) ?  ?  ?Assistance Recommended at Discharge Intermittent Supervision/Assistance  ?Patient can return home with the following A lot of help with walking and/or transfers;A lot of help with bathing/dressing/bathroom;Help with stairs or ramp for entrance;Assistance with cooking/housework;Assist for transportation ?  ?Equipment Recommendations ? Other (comment) (defer)  ?  ?Recommendations for Other Services   ? ? ?  ?Precautions / Restrictions Precautions ?Precautions: Fall ?Restrictions ?Weight Bearing Restrictions: Yes ?RLE Weight Bearing: Non weight bearing  ?  ? ?Mobility ? Bed Mobility ?Overal bed mobility: Needs Assistance ?  ?  ?  ?  ?  ?  ?General bed mobility comments: sitting up in bed with assist of excessive HOB elevation ?  ? ?Transfers ?Overall transfer level: Needs assistance ?  ?Transfers: Bed to chair/wheelchair/BSC ?  ?  ?  ?  ?  ? Lateral/Scoot Transfers: Min assist ?General transfer comment: VERY increased time, cues  for sequencing, weight shifting and hip translation. Pt exclaiming in pain due to R residual limb intermittently. Transferred to/from EOB. ?  ? ?Ambulation/Gait ?  ?  ?  ?  ?  ?  ?  ?  ? ? ?Stairs ?  ?  ?  ?  ?  ? ? ?Wheelchair Mobility ?  ? ?Modified Rankin (Stroke Patients Only) ?  ? ? ?  ?Balance   ?  ?  ?  ?  ?  ?  ?  ?  ?  ?  ?  ?  ?  ?  ?  ?  ?  ?  ?  ? ?  ?Cognition Arousal/Alertness: Awake/alert ?Behavior During Therapy: Flat affect ?Overall Cognitive Status: Within Functional Limits for tasks assessed ?  ?  ?  ?  ?  ?  ?  ?  ?  ?  ?  ?  ?  ?  ?  ?  ?General Comments: pt stalls during mobility suspect secondary to anxiety ?  ?  ? ?  ?Exercises   ? ?  ?General Comments   ?  ?  ? ?Pertinent Vitals/Pain Pain Assessment ?Pain Assessment: Faces ?Faces Pain Scale: Hurts even more ?Pain Location: R residual limb ?Pain Descriptors / Indicators: Discomfort, Grimacing, Guarding, Moaning ?Pain Intervention(s): Limited activity within patient's tolerance, Monitored during session, Repositioned, PCA encouraged  ? ? ?Home Living   ?  ?  ?  ?  ?  ?  ?  ?  ?  ?   ?  ?Prior Function    ?  ?  ?   ? ?PT Goals (current goals can now be found  in the care plan section) Acute Rehab PT Goals ?Patient Stated Goal: return home ?PT Goal Formulation: With patient ?Time For Goal Achievement: 10/09/21 ?Potential to Achieve Goals: Fair ?Progress towards PT goals: Progressing toward goals ? ?  ?Frequency ? ? ? Min 2X/week ? ? ? ?  ?PT Plan Current plan remains appropriate  ? ? ?Co-evaluation   ?  ?  ?  ?  ? ?  ?AM-PAC PT "6 Clicks" Mobility   ?Outcome Measure ? Help needed turning from your back to your side while in a flat bed without using bedrails?: A Lot ?Help needed moving from lying on your back to sitting on the side of a flat bed without using bedrails?: A Lot ?Help needed moving to and from a bed to a chair (including a wheelchair)?: A Lot ?Help needed standing up from a chair using your arms (e.g., wheelchair or bedside chair)?:  Total ?Help needed to walk in hospital room?: Total ?Help needed climbing 3-5 steps with a railing? : Total ?6 Click Score: 9 ? ?  ?End of Session   ?Activity Tolerance: Patient limited by pain ?Patient left: in bed;with call bell/phone within reach;with bed alarm set ?Nurse Communication: Mobility status ?PT Visit Diagnosis: Other abnormalities of gait and mobility (R26.89);Pain ?Pain - Right/Left: Right ?Pain - part of body: Leg ?  ? ? ?Time: 5329-9242 ?PT Time Calculation (min) (ACUTE ONLY): 30 min ? ?Charges:  $Therapeutic Activity: 23-37 mins          ?          ? ?Marye Round, PT DPT ?Acute Rehabilitation Services ?Pager [phone]  ?Office [phone] ? ? ? ?Kaelynn Igo E Stroup ?09/29/2021, 4:23 PM ? ?

## 2021-09-29 NOTE — Progress Notes (Signed)
? ?  RE:  [name]       ?Date of Birth: 06/08/1971      ?Date:   09/29/21     ? ? ?To Whom It May Concern: ? ?Please be advised that the above-named patient will require a short-term nursing home stay - anticipated 30 days or less for rehabilitation and strengthening.  The plan is for return home. ? ? ?              ?MD signature ? ?              ?Date ?

## 2021-09-29 NOTE — Progress Notes (Signed)
[name] ?ROUNDING NOTE  ? ?Subjective:  ? ?Interval History: 51 year old lady with tobacco abuse hypertension diabetes and left AKA admitted with peripheral vascular disease and critical ischemia of right leg 09/24/2021.  She underwent right AKA 09/24/2021. ? ?Blood pressure 137/65 pulse 88 temperature 97.9 O2 sats 100% ? ?Sodium 125 potassium 4.9 chloride 97 CO2 20 BUN 20 creatinine 1.3 glucose 63 calcium 8.3 ? ? ?Objective:  ?Vital signs in last 24 hours:  ?Temp:  [97.9 ?F (36.6 ?C)-98.5 ?F (36.9 ?C)] 97.9 ?F (36.6 ?C) (03/06 0500) ?Pulse Rate:  [90] 90 (03/06 0500) ?Resp:  [12-20] 14 (03/06 0500) ?BP: (109-125)/(65-80) 125/65 (03/06 0500) ?SpO2:  [96 %-100 %] 100 % (03/06 0500) ? ?Weight change:  ?Filed Weights  ? 09/24/21 0651  ?Weight: 86.2 kg  ? ? ?Intake/Output: ?I/O last 3 completed shifts: ?In: 1317 [P.O.:1317] ?Out: 3100 [Urine:3100] ?  ?Intake/Output this shift: ? No intake/output data recorded. ? ?CVS- RRR no murmurs rubs gallops ?RS- CTA no wheeze or rales ?ABD- BS present soft non-distended ?EXT-left AKA and right AKA ? ? ?Basic Metabolic Panel: ?Recent Labs  ?Lab 09/25/21 ?0133 09/25/21 ?1652 09/26/21 ?0150 09/27/21 ?0206 09/27/21 ?0900 09/28/21 ?0135 09/28/21 ?1250 09/28/21 ?2006 09/29/21 ?0149  ?NA 128* 125* 126* 125*  --  123* 127* 125* 125*  ?K 5.7* 4.4 4.1 4.4  --  5.1  --   --  4.9  ?CL 99 96* 100 99  --  97*  --   --  97*  ?CO2 17* 20* 19* 18*  --  20*  --   --  20*  ?GLUCOSE 271* 137* 74 113*  --  126*  --   --  63*  ?BUN 18 18 16 17   --  17  --   --  20  ?CREATININE 1.23* 1.30* 1.22* 1.37*  --  1.26*  --   --  1.30*  ?CALCIUM 8.1* 8.0* 8.1* 7.9*  --  8.1*  --   --  8.3*  ?MG 1.5*  --  1.4*  --  2.4  --   --   --   --   ? ? ?Liver Function Tests: ?Recent Labs  ?Lab 09/24/21 ?[id number]  ?AST 10*  ?ALT 9  ?ALKPHOS 110  ?BILITOT 0.2*  ?PROT 6.4*  ?ALBUMIN 3.3*  ? ?No results for input(s): LIPASE, AMYLASE in the last 168 hours. ?No results for input(s): AMMONIA in the last 168  hours. ? ?CBC: ?Recent Labs  ?Lab 09/25/21 ?0133 09/26/21 ?0150 09/27/21 ?0206 09/28/21 ?[id number] 09/29/21 ?0149  ?WBC 19.9* 13.1* 11.5* 10.9* 11.4*  ?NEUTROABS  --   --  7.4 6.7 7.2  ?HGB 10.4* 9.7* 9.3* 8.8* 8.4*  ?HCT 31.7* 29.3* 27.4* 27.4* 25.4*  ?MCV 96.6 96.1 96.8 98.6 96.2  ?PLT 341 302 292 312 308  ? ? ?Cardiac Enzymes: ?No results for input(s): CKTOTAL, CKMB, CKMBINDEX, TROPONINI in the last 168 hours. ? ?BNP: ?Invalid input(s): POCBNP ? ?CBG: ?Recent Labs  ?Lab 09/28/21 ?0733 09/28/21 ?1111 09/28/21 ?1633 09/28/21 ?2051 09/29/21 ?0453  ?GLUCAP 86 117* 162* 179* 89  ? ? ?Microbiology: ?Results for orders placed or performed in visit on 09/22/21  ?SARS Coronavirus 2 (TAT 6-24 hrs)     Status: None  ? Collection Time: 09/22/21 12:00 AM  ?Result Value Ref Range Status  ? SARS Coronavirus 2 RESULT: NEGATIVE  Final  ?  Comment: RESULT: NEGATIVESARS-CoV-2 INTERPRETATION:A NEGATIVE  test result means that SARS-CoV-2 RNA was not present in the specimen above the limit  of detection of this test. This does not preclude a possible SARS-CoV-2 infection and should not be used as the  ?sole basis for patient management decisions. Negative results must be combined with clinical observations, patient history, and epidemiological information. Optimum specimen types and timing for peak viral levels during infections caused by SARS-CoV-2  ?have not been determined. Collection of multiple specimens or types of specimens may be necessary to detect virus. Improper specimen collection and handling, sequence variability under primers/probes, or organism present below the limit of detection may  ?lead to false negative results. Positive and negative predictive values of testing are highly dependent on prevalence. False negative test results are more likely when prevalence of disease is high.The expected result is NEGATIVE.Fact S heet for  ?Healthcare Providers: LocalChronicle.no Sheet for Patients:  SalonLookup.es Reference Range - Negative ?  ? ? ?Coagulation Studies: ?No results for input(s): LABPROT, INR in the last 72 hours. ? ?Urinalysis: ?No results for input(s): COLORURINE, LABSPEC, [place], GLUCOSEU, HGBUR, BILIRUBINUR, KETONESUR, PROTEINUR, UROBILINOGEN, NITRITE, LEUKOCYTESUR in the last 72 hours. ? ?Invalid input(s): APPERANCEUR  ? ? ?Imaging: ?No results found. ? ? ?Medications:  ? ? magnesium sulfate bolus IVPB    ? ? aspirin EC  81 mg Oral Daily  ? atorvastatin  40 mg Oral QPM  ? bisacodyl  10 mg Rectal Once  ? carvedilol  3.125 mg Oral BID WC  ? clopidogrel  75 mg Oral Daily  ? docusate sodium  100 mg Oral Daily  ? ferrous sulfate  325 mg Oral Q breakfast  ? folic acid  1 mg Oral Daily  ? gabapentin  400 mg Oral TID  ? heparin  5,000 Units Subcutaneous Q8H  ? HYDROmorphone   Intravenous Q4H  ? insulin aspart  0-15 Units Subcutaneous TID WC  ? insulin aspart  0-5 Units Subcutaneous QHS  ? insulin glargine-yfgn  40 Units Subcutaneous BID  ? linaclotide  145 mcg Oral QAC breakfast  ? living well with diabetes book   Does not apply Once  ? methocarbamol  500 mg Oral BID  ? nicotine  14 mg Transdermal Daily  ? pantoprazole  40 mg Oral Daily  ? polyethylene glycol  17 g Oral BID  ? senna-docusate  1 tablet Oral BID  ? topiramate  25 mg Oral QPM  ? vitamin B-12  1,000 mcg Oral Daily  ? ?acetaminophen, ALPRAZolam, alum & mag hydroxide-simeth, bisacodyl, diphenhydrAMINE **OR** diphenhydrAMINE, guaiFENesin-dextromethorphan, hydrALAZINE, HYDROcodone-acetaminophen, HYDROcodone-acetaminophen, labetalol, magnesium sulfate bolus IVPB, metoprolol tartrate, naloxone **AND** sodium chloride flush, ondansetron, oxyCODONE-acetaminophen, phenol, potassium chloride ? ?Assessment/ Plan:  ?Euvolemic hypotonic hyponatremia secondary to SIADH driven by pain.  Continue fluid restriction and continue to monitor. ? ? ? LOS: 5 ?Sherril Croon ?@TODAY @7 :40 AM ?  ?

## 2021-09-29 NOTE — Progress Notes (Signed)
Inpatient Diabetes Program Recommendations ? ?AACE/ADA: New Consensus Statement on Inpatient Glycemic Control (2015) ? ?Target Ranges:  Prepandial:   less than 140 mg/dL ?     Peak postprandial:   less than 180 mg/dL (1-2 hours) ?     Critically ill patients:  140 - 180 mg/dL  ? ?Lab Results  ?Component Value Date  ? GLUCAP 146 (H) 09/29/2021  ? HGBA1C 10.0 (H) 09/24/2021  ? ? ?Review of Glycemic Control ? Latest Reference Range & Units 09/28/21 07:33 09/28/21 11:11 09/28/21 16:33 09/28/21 20:51 09/29/21 04:53 09/29/21 08:23 09/29/21 11:16  ?Glucose-Capillary 70 - 99 mg/dL 86 540 (H) 981 (H) 191 (H) 89 63 (L) 146 (H)  ?(H): Data is abnormally high ?(L): Data is abnormally low ? ?Inpatient Diabetes Program Recommendations:   ?Fasting CBG 63. ?-Decrease Novolog correction to 0-9 units tid = hs 0-5 units ?-Decrease Semglee to 35 units bid ? ?Thank you, ?Billy Fischer Angelice Piech, RN, MSN, CDE  ?Diabetes Coordinator ?Inpatient Glycemic Control Team ?Team Pager [phone] (8am-5pm) ?09/29/2021 12:31 PM ? ? ? ? ?

## 2021-09-29 NOTE — Progress Notes (Signed)
?  Progress Note ? ? ?Patient: [name] KGY:[id number] DOB: 10/03/1970 DOA: 09/24/2021     5 ?DOS: the patient was seen and examined on 09/29/2021 ?  ?Brief hospital course: ?51 year old female with a history of peripheral vascular disease status post left AKA, COPD, HTN, DM, admitted to the hospital for critical limb ischemia of the right lower extremity requiring right above-the-knee palpation.  Patient underwent surgery on 3/1. Postoperatively, she has been having significant pain and has been started on a PCA. She is noted to have progressive hyponatremia which is being worked up. ? ? ?Assessment and Plan: ? ?PAD (peripheral artery disease) (HCC) ?Patient with prior L AKA ?Currently s/p R AKA on 3/1, after aortogram demonstrated no viable revascularization targets ?Continue ASA and Plavix ?Management per vascular surgery ?PT/OT with recommendations for SNF placement ? ?Leukocytosis ?Noted to be chronic  ?Currently afebrile, with no signs of infection ?Daily CBC ? ?Hyponatremia ?Likely SIADH ?Plasma osmolality 268, urine sodium 19, urine osmolality 162 ?Nephrology consulted, 1.5 L fluid restriction ?Trend sodium level ?Daily BMP ? ?Hypomagnesemia ?Replace as needed ? ?Iron def/Vitamin B12 def/folic acid def anemia ?Multifactorial, recent surgery, hemodilution with IV fluids, iron def ?Anemia panel iron 18, TIBC 249, sat 7%, ferritin 72, folate 5, vitamin B12-->87 ?Start oral iron, vitamin B12, folic acid supplementation ?Daily CBC ? ?CKD (chronic kidney disease) stage 3a, GFR 30-59 ml/min (HCC) ?Baseline creatinine of 1.2 (from care everyone), around baseline ?Daily BMP ? ?Tobacco dependence ?Advised to quit ?Patch ordered  ? ?COPD(HCC) ?Stable ? ?Constipation ?Continue on Linzess, start MiraLAX and senna BID ? ?Hypertension ?Continue Coreg, IV hydralazine prn ?Hold Lisinopril ? ?Diabetes mellitus (HCC) ?A1C 10.0 ?Continue SSI, Semglee, Accu-Cheks, hypoglycemic protocol ?Hold Glucophage,  glucotrol ? ?Obesity ?Lifestyle modification advised ? ? ? ?Subjective:  ?Today, patient still complaining of postsurgical pain around gradual improvements still on PCA, plans to wean off.  Denies any other new complaints.  Teary about her hospital diet. ? ? ?Physical Exam: ?Vitals:  ? 09/29/21 1242 09/29/21 1248 09/29/21 1605 09/29/21 1655  ?BP: 140/80   135/66  ?Pulse: (!) 50   98  ?Resp: 18 20 15    ?Temp: 98.3 ?F (36.8 ?C)     ?TempSrc: Oral     ?SpO2: 99% 100%    ?Weight:      ?Height:      ? ?General: NAD ?Cardiovascular: S1, S2 present ?Respiratory: CTAB ?Abdomen: Soft, nontender, nondistended, bowel sounds present ?Musculoskeletal: BLE AKA amputations, R AKA with wound vac ?Skin: Normal ?Psychiatry: Normal mood  ? ? ? ?Data Reviewed: ? ?Reviewed CBC, chemistry ? ?Family Communication: None at the bedside ? ?Disposition: ?Status is: Inpatient ?Remains inpatient appropriate because: Needs continued postoperative care as well as skilled nursing facility placement ? ? ? ? ? ?Planned Discharge Destination: Skilled nursing facility ? ?DVT Prophylaxis  ., Heparin injection 5,000 units ? ? ? ? ?Author: ? , MD ?09/29/2021 5:27 PM ? ?For on call review [email or web address].  ? ?

## 2021-09-29 NOTE — Progress Notes (Addendum)
[facility] ? ?Subjective  - pain in right AKA contolled with pain medcaiton via IV ? ? ?Objective ?125/65 ?90 ?97.9 ?F (36.6 ?C) (Oral) ?14 ?100% ? ?Intake/Output Summary (Last 24 hours) at 09/29/2021 0732 ?Last data filed at 09/29/2021 0435 ?Gross per 24 hour  ?Intake 597 ml  ?Output 1800 ml  ?Net -1203 ml  ? ? ?Right AKA dressing in place with good compression ?Lungs non labored breathing ?Heart RRR ? ?Assessment/Planning: ?POD # 5 right AKA ? ?Plan on weaning off IV pain medication and transition to PO ?Dressings will remain in place until 3/8 or prior to d/c  ?Staples will be maintained for 4 weeks ? ?Mosetta Pigeon ?09/29/2021 ?7:32 AM ?-- ? ?Laboratory ?Lab Results: ?Recent Labs  ?  09/28/21 ?0135 09/29/21 ?0149  ?WBC 10.9* 11.4*  ?HGB 8.8* 8.4*  ?HCT 27.4* 25.4*  ?PLT 312 308  ? ?BMET ?Recent Labs  ?  09/28/21 ?0135 09/28/21 ?1250 09/28/21 ?2006 09/29/21 ?0149  ?NA 123*   < > 125* 125*  ?K 5.1  --   --  4.9  ?CL 97*  --   --  97*  ?CO2 20*  --   --  20*  ?GLUCOSE 126*  --   --  63*  ?BUN 17  --   --  20  ?CREATININE 1.26*  --   --  1.30*  ?CALCIUM 8.1*  --   --  8.3*  ? < > = values in this interval not displayed.  ? ? ?COAG ?Lab Results  ?Component Value Date  ? INR 0.9 09/24/2021  ? INR 0.97 02/14/2014  ? INR 1.23 01/28/2012  ? ?No results found for: PTT ? ?VASCULAR STAFF ADDENDUM: ?I agree with the above.  ? ?Rande Brunt. Lenell Antu, MD ?[facility] ?Office Phone Number: [phone] ?09/29/2021 12:18 PM ? ? ? ?

## 2021-09-29 NOTE — TOC Progression Note (Signed)
Transition of Care (TOC) - Progression Note  ? ? ?Patient Details  ?Name: [name] ?MRN: [id number] ?Date of Birth: October 06, 1970 ? ?Transition of Care (TOC) CM/SW Contact  ?Joanne Chars, LCSW ?Phone Number: ?09/29/2021, 2:16 PM ? ?Clinical Narrative:   CSW presented bed offers, pt asked that CSW get responses from [facility] and [facility].  Level 2 passr still pending, asking for "LTC-FL2", unclear if this is something different than a regular FL2 ? ? ? ?Expected Discharge Plan: [place] ?Barriers to Discharge: Continued Medical Work up, SNF Pending bed offer ? ?Expected Discharge Plan and Services ?Expected Discharge Plan: [place] ?In-house Referral: Clinical Social Work ?  ?Post Acute Care Choice: [place] ?Living arrangements for the past 2 months: Mobile Home ?                ?  ?  ?  ?  ?  ?  ?  ?  ?  ?  ? ? ?Social Determinants of Health (SDOH) Interventions ?  ? ?Readmission Risk Interventions ?No flowsheet data found. ? ?

## 2021-09-30 LAB — BASIC METABOLIC PANEL
Anion gap: 10 (ref 5–15)
BUN: 22 mg/dL — ABNORMAL HIGH (ref 6–20)
CO2: 19 mmol/L — ABNORMAL LOW (ref 22–32)
Calcium: 8.6 mg/dL — ABNORMAL LOW (ref 8.9–10.3)
Chloride: 100 mmol/L (ref 98–111)
Creatinine, Ser: 1.33 mg/dL — ABNORMAL HIGH (ref 0.44–1.00)
GFR, Estimated: 48 mL/min — ABNORMAL LOW (ref >60)
Glucose, Bld: 42 mg/dL — CL (ref 70–99)
Potassium: 4.8 mmol/L (ref 3.5–5.1)
Sodium: 129 mmol/L — ABNORMAL LOW (ref 135–145)

## 2021-09-30 LAB — CBC WITH DIFFERENTIAL/PLATELET
Abs Immature Granulocytes: 0.15 10*3/uL — ABNORMAL HIGH (ref 0.00–0.07)
Basophils Absolute: 0.1 10*3/uL (ref 0.0–0.1)
Basophils Relative: 1 %
Eosinophils Absolute: 0.6 10*3/uL — ABNORMAL HIGH (ref 0.0–0.5)
Eosinophils Relative: 4 %
HCT: 26.3 % — ABNORMAL LOW (ref 36.0–46.0)
Hemoglobin: 8.9 g/dL — ABNORMAL LOW (ref 12.0–15.0)
Immature Granulocytes: 1 %
Lymphocytes Relative: 14 %
Lymphs Abs: 2.1 10*3/uL (ref 0.7–4.0)
MCH: 32.8 pg (ref 26.0–34.0)
MCHC: 33.8 g/dL (ref 30.0–36.0)
MCV: 97 fL (ref 80.0–100.0)
Monocytes Absolute: 1.3 10*3/uL — ABNORMAL HIGH (ref 0.1–1.0)
Monocytes Relative: 9 %
Neutro Abs: 10.7 10*3/uL — ABNORMAL HIGH (ref 1.7–7.7)
Neutrophils Relative %: 71 %
Platelets: 349 10*3/uL (ref 150–400)
RBC: 2.71 MIL/uL — ABNORMAL LOW (ref 3.87–5.11)
RDW: 14.4 % (ref 11.5–15.5)
WBC: 14.9 10*3/uL — ABNORMAL HIGH (ref 4.0–10.5)
nRBC: 0 % (ref 0.0–0.2)

## 2021-09-30 LAB — GLUCOSE, CAPILLARY
Glucose-Capillary: 189 mg/dL — ABNORMAL HIGH (ref 70–99)
Glucose-Capillary: 132 mg/dL — ABNORMAL HIGH (ref 70–99)
Glucose-Capillary: 101 mg/dL — ABNORMAL HIGH (ref 70–99)
Glucose-Capillary: 94 mg/dL (ref 70–99)
Glucose-Capillary: 52 mg/dL — ABNORMAL LOW (ref 70–99)
Glucose-Capillary: 126 mg/dL — ABNORMAL HIGH (ref 70–99)

## 2021-09-30 LAB — SODIUM: Sodium: 129 mmol/L — ABNORMAL LOW (ref 135–145)

## 2021-09-30 MED ORDER — INSULIN GLARGINE-YFGN 100 UNIT/ML ~~LOC~~ SOLN
20.0000 [IU] | Freq: Two times a day (BID) | SUBCUTANEOUS | Status: DC
  Administered 2021-09-30 – 2021-10-02 (×5): 20 [IU] via SUBCUTANEOUS
  Filled 2021-09-30 (×6): qty 0.2

## 2021-09-30 MED ORDER — DEXTROSE 50 % IV SOLN
INTRAVENOUS | Status: AC
  Administered 2021-09-30: 50 mL
  Filled 2021-09-30: qty 50

## 2021-09-30 NOTE — TOC Progression Note (Signed)
Transition of Care (TOC) - Progression Note  ? ? ?Patient Details  ?Name: [name] ?MRN: [id number] ?Date of Birth: Sep 30, 1970 ? ?Transition of Care (TOC) CM/SW Contact  ?Lorri Frederick, LCSW ?Phone Number: ?09/30/2021, 4:05 PM ? ?Clinical Narrative:   Pt received bed offer from [facility], not from [facility].  CSW attempted to give this update to pt, she was sleeping very soundly and did not wake up. ? ?Level 2 passr docs uploaded in [place], who has requested at "long term care FL2" form, which was found online, completed and submitted.   ? ? ? ?Expected Discharge Plan: Skilled Nursing Facility ?Barriers to Discharge: Continued Medical Work up, SNF Pending bed offer ? ?Expected Discharge Plan and Services ?Expected Discharge Plan: Skilled Nursing Facility ?In-house Referral: Clinical Social Work ?  ?Post Acute Care Choice: Skilled Nursing Facility ?Living arrangements for the past 2 months: Mobile Home ?                ?  ?  ?  ?  ?  ?  ?  ?  ?  ?  ? ? ?Social Determinants of Health (SDOH) Interventions ?  ? ?Readmission Risk Interventions ?No flowsheet data found. ? ?

## 2021-09-30 NOTE — Progress Notes (Signed)
Mobility Specialist: Progress Note ? ? 09/30/21 1515  ?Mobility  ?Activity Transferred from chair to bed  ?Level of Assistance Maximum assist, patient does 25-49%  ?Assistive Device None  ?RLE Weight Bearing NWB  ?Activity Response Tolerated fair  ?$Mobility charge 1 Mobility  ? ?Pt assisted back to bed from the recliner requiring modA-maxA to scoot with physical assist pulling pad. Pt c/o 8/10 pain in her RLE during transfer, otherwise asymptomatic. Pt back in bed with call bell and phone at her side. Bed alarm is on.  ? ?[name] ?Mobility Specialist ?Mobility Specialist 5 North: [phone] ?Mobility Specialist 6 North: [phone] ? ?

## 2021-09-30 NOTE — Progress Notes (Signed)
[facility] ?ROUNDING NOTE  ? ?Subjective:  ? ?Interval History: 51 year old lady with tobacco abuse hypertension diabetes and left AKA admitted with peripheral vascular disease and critical ischemia of right leg 09/24/2021.  She underwent right AKA 09/24/2021. ? ?Blood pressure 147/78 pulse 96 temperature 97.5 O2 sats 99% 2 L nasal cannula ? ?Sodium 129 potassium 4.8 chloride 100 CO2 19 BUN 22 creatinine 1.33 glucose 42 calcium 8.6 hemoglobin 8.9 ? ? ?Objective:  ?Vital signs in last 24 hours:  ?Temp:  [97.4 ?F (36.3 ?C)-98.6 ?F (37 ?C)] 97.4 ?F (36.3 ?C) (03/07 0739) ?Pulse Rate:  [50-102] 96 (03/07 0739) ?Resp:  [12-20] 20 (03/07 0739) ?BP: (96-155)/(52-80) 147/78 (03/07 0739) ?SpO2:  [94 %-100 %] 99 % (03/07 0739) ?FiO2 (%):  [29 %-33 %] 29 % (03/07 0300) ? ?Weight change:  ?Filed Weights  ? 09/24/21 0651  ?Weight: 86.2 kg  ? ? ?Intake/Output: ?I/O last 3 completed shifts: ?In: 714 [P.O.:714] ?Out: 1550 [Urine:1550] ?  ?Intake/Output this shift: ? No intake/output data recorded. ? ?CVS- RRR no murmurs rubs gallops ?RS- CTA no wheeze or rales ?ABD- BS present soft non-distended ?EXT-left AKA and right AKA ? ? ?Basic Metabolic Panel: ?Recent Labs  ?Lab 09/25/21 ?0133 09/25/21 ?1652 09/26/21 ?0150 09/27/21 ?0206 09/27/21 ?0900 09/28/21 ?0135 09/28/21 ?1250 09/29/21 ?0149 09/29/21 ?2426 09/29/21 ?1418 09/29/21 ?2028 09/30/21 ?0139  ?NA 128*   < > 126* 125*  --  123*   < > 125* 128* 126* 128* 129*  ?K 5.7*   < > 4.1 4.4  --  5.1  --  4.9  --   --   --  4.8  ?CL 99   < > 100 99  --  97*  --  97*  --   --   --  100  ?CO2 17*   < > 19* 18*  --  20*  --  20*  --   --   --  19*  ?GLUCOSE 271*   < > 74 113*  --  126*  --  63*  --   --   --  42*  ?BUN 18   < > 16 17  --  17  --  20  --   --   --  22*  ?CREATININE 1.23*   < > 1.22* 1.37*  --  1.26*  --  1.30*  --   --   --  1.33*  ?CALCIUM 8.1*   < > 8.1* 7.9*  --  8.1*  --  8.3*  --   --   --  8.6*  ?MG 1.5*  --  1.4*  --  2.4  --   --   --   --   --   --   --    ? < > = values in this interval not displayed.  ? ? ? ?Liver Function Tests: ?Recent Labs  ?Lab 09/24/21 ?8341  ?AST 10*  ?ALT 9  ?ALKPHOS 110  ?BILITOT 0.2*  ?PROT 6.4*  ?ALBUMIN 3.3*  ? ? ?No results for input(s): LIPASE, AMYLASE in the last 168 hours. ?No results for input(s): AMMONIA in the last 168 hours. ? ?CBC: ?Recent Labs  ?Lab 09/26/21 ?0150 09/27/21 ?0206 09/28/21 ?0135 09/29/21 ?0149 09/30/21 ?0139  ?WBC 13.1* 11.5* 10.9* 11.4* 14.9*  ?NEUTROABS  --  7.4 6.7 7.2 10.7*  ?HGB 9.7* 9.3* 8.8* 8.4* 8.9*  ?HCT 29.3* 27.4* 27.4* 25.4* 26.3*  ?MCV 96.1 96.8 98.6 96.2 97.0  ?PLT 302 292 312  308 349  ? ? ? ?Cardiac Enzymes: ?No results for input(s): CKTOTAL, CKMB, CKMBINDEX, TROPONINI in the last 168 hours. ? ?BNP: ?Invalid input(s): POCBNP ? ?CBG: ?Recent Labs  ?Lab 09/29/21 ?0823 09/29/21 ?1116 09/29/21 ?1623 09/29/21 ?2043 09/30/21 ?0507  ?GLUCAP 63* 146* 106* 97 189*  ? ? ? ?Microbiology: ?Results for orders placed or performed in visit on 09/22/21  ?SARS Coronavirus 2 (TAT 6-24 hrs)     Status: None  ? Collection Time: 09/22/21 12:00 AM  ?Result Value Ref Range Status  ? SARS Coronavirus 2 RESULT: NEGATIVE  Final  ?  Comment: RESULT: NEGATIVESARS-CoV-2 INTERPRETATION:A NEGATIVE  test result means that SARS-CoV-2 RNA was not present in the specimen above the limit of detection of this test. This does not preclude a possible SARS-CoV-2 infection and should not be used as the  ?sole basis for patient management decisions. Negative results must be combined with clinical observations, patient history, and epidemiological information. Optimum specimen types and timing for peak viral levels during infections caused by SARS-CoV-2  ?have not been determined. Collection of multiple specimens or types of specimens may be necessary to detect virus. Improper specimen collection and handling, sequence variability under primers/probes, or organism present below the limit of detection may  ?lead to false negative results.  Positive and negative predictive values of testing are highly dependent on prevalence. False negative test results are more likely when prevalence of disease is high.The expected result is NEGATIVE.Fact S heet for  ?Healthcare Providers: [employer]gl Sheet for Patients: https://poole-freeman.org/ Reference Range - Negative ?  ? ? ?Coagulation Studies: ?No results for input(s): LABPROT, INR in the last 72 hours. ? ?Urinalysis: ?No results for input(s): COLORURINE, LABSPEC, PHURINE, GLUCOSEU, HGBUR, BILIRUBINUR, KETONESUR, PROTEINUR, UROBILINOGEN, NITRITE, LEUKOCYTESUR in the last 72 hours. ? ?Invalid input(s): APPERANCEUR  ? ? ?Imaging: ?No results found. ? ? ?Medications:  ? ? magnesium sulfate bolus IVPB    ? ? aspirin EC  81 mg Oral Daily  ? atorvastatin  40 mg Oral QPM  ? bisacodyl  10 mg Rectal Once  ? carvedilol  3.125 mg Oral BID WC  ? clopidogrel  75 mg Oral Daily  ? docusate sodium  100 mg Oral Daily  ? ferrous sulfate  325 mg Oral Q breakfast  ? folic acid  1 mg Oral Daily  ? gabapentin  400 mg Oral TID  ? heparin  5,000 Units Subcutaneous Q8H  ? HYDROmorphone   Intravenous Q4H  ? insulin aspart  0-5 Units Subcutaneous QHS  ? insulin aspart  0-9 Units Subcutaneous TID WC  ? insulin glargine-yfgn  20 Units Subcutaneous BID  ? linaclotide  145 mcg Oral QAC breakfast  ? living well with diabetes book   Does not apply Once  ? methocarbamol  500 mg Oral BID  ? nicotine  14 mg Transdermal Daily  ? pantoprazole  40 mg Oral Daily  ? polyethylene glycol  17 g Oral BID  ? senna-docusate  1 tablet Oral BID  ? topiramate  25 mg Oral QPM  ? vitamin B-12  1,000 mcg Oral Daily  ? ?acetaminophen, ALPRAZolam, alum & mag hydroxide-simeth, bisacodyl, diphenhydrAMINE **OR** diphenhydrAMINE, guaiFENesin-dextromethorphan, hydrALAZINE, HYDROcodone-acetaminophen, HYDROcodone-acetaminophen, labetalol, magnesium sulfate bolus IVPB, metoprolol tartrate, naloxone **AND** sodium  chloride flush, ondansetron, oxyCODONE-acetaminophen, phenol, potassium chloride ? ?Assessment/ Plan:  ?Euvolemic hypotonic hyponatremia secondary to SIADH driven by pain.  Continue fluid restriction and continue to monitor. ?Sodium much improved we will sign off today. ? ? ? LOS: 6 ?Garnetta Buddy ?@  TODAY@7 :59 AM ?  ?

## 2021-09-30 NOTE — Plan of Care (Signed)

## 2021-09-30 NOTE — Progress Notes (Signed)
Occupational Therapy Treatment ?Patient Details ?Name: [name] ?MRN: [id number] ?DOB: 1970/11/17 ?Today's Date: 09/30/2021 ? ? ?History of present illness Pt is a 51 y.o. female admitted 09/24/21 for R above knee amputation. PMH includes COPD, DM, HTN, PVD, PE, anxiety, depression, L AKA (pt reports 2021). ?  ?OT comments ? Pt progressing slowly towards goals this session, able to complete lateral scoot transfer to chair with min A and increased time. Pt moving very slowly this session due to pain, requires increased cuing and encouragement during transfer. Pt A & Ox3 during session, disoriented to place thinking she is in her sister's bedroom, VSS and RN aware. Pt presenting with impairments listed below, will follow acutely. Continue to recommend SNF at d/c.  ? ?Recommendations for follow up therapy are one component of a multi-disciplinary discharge planning process, led by the attending physician.  Recommendations may be updated based on patient status, additional functional criteria and insurance authorization. ?   ?Follow Up Recommendations ? Skilled nursing-short term rehab (<3 hours/day)  ?  ?Assistance Recommended at Discharge Frequent or constant Supervision/Assistance  ?Patient can return home with the following ? A lot of help with walking and/or transfers;A lot of help with bathing/dressing/bathroom;Assistance with cooking/housework;Assist for transportation;Help with stairs or ramp for entrance ?  ?Equipment Recommendations ? Wheelchair cushion (measurements OT)  ?  ?Recommendations for Other Services   ? ?  ?Precautions / Restrictions Precautions ?Precautions: Fall ?Restrictions ?Weight Bearing Restrictions: Yes ?RLE Weight Bearing: Non weight bearing  ? ? ?  ? ?Mobility Bed Mobility ?Overal bed mobility: Needs Assistance ?  ?  ?  ?  ?  ?  ?General bed mobility comments: long sitting in bed upon arrival, increased time to pivot  hips to EOB and to chair ?  ? ?Transfers ?Overall transfer level: Needs  assistance ?  ?Transfers: Bed to chair/wheelchair/BSC ?  ?  ?  ?  ?  ? Lateral/Scoot Transfers: Min assist ?General transfer comment: min A for trunk support, great length of time required to perform lateral scoot transfer due to pain ?  ?  ?Balance Overall balance assessment: Needs assistance ?Sitting-balance support: Bilateral upper extremity supported ?Sitting balance-Leahy Scale: Fair ?  ?  ?  ?  ?  ?  ?  ?  ?  ?  ?  ?  ?  ?  ?  ?  ?   ? ?ADL either performed or assessed with clinical judgement  ? ?ADL   ?  ?  ?  ?  ?  ?  ?  ?  ?  ?  ?  ?  ?  ?  ?  ?  ?  ?  ?  ?  ?  ? ?Extremity/Trunk Assessment Upper Extremity Assessment ?Upper Extremity Assessment: Generalized weakness ?  ?Lower Extremity Assessment ?Lower Extremity Assessment: Defer to PT evaluation ?  ?  ?  ? ?Vision   ?Vision Assessment?: No apparent visual deficits ?  ?Perception Perception ?Perception: Not tested ?  ?Praxis Praxis ?Praxis: Not tested ?  ? ?Cognition Arousal/Alertness: Awake/alert ?Behavior During Therapy: Flat affect ?Overall Cognitive Status: Within Functional Limits for tasks assessed ?  ?  ?  ?  ?  ?  ?  ?  ?  ?  ?  ?  ?  ?  ?  ?  ?General Comments: pt anxious with movement, A & O x3 this session, stating she is in her sister's bedroom, RN aware ?  ?  ?   ?Exercises   ? ?  ?  Shoulder Instructions   ? ? ?  ?General Comments    ? ? ?Pertinent Vitals/ Pain       Pain Assessment ?Pain Assessment: Faces ?Pain Score: 6  ?Faces Pain Scale: Hurts even more ?Pain Location: R residual limb ?Pain Descriptors / Indicators: Discomfort, Grimacing, Guarding, Moaning ?Pain Intervention(s): Limited activity within patient's tolerance, Monitored during session, PCA encouraged, Premedicated before session, Repositioned ? ?Home Living   ?  ?  ?  ?  ?  ?  ?  ?  ?  ?  ?  ?  ?  ?  ?  ?  ?  ?  ? ?  ?Prior Functioning/Environment    ?  ?  ?  ?   ? ?Frequency ? Min 2X/week  ? ? ? ? ?  ?Progress Toward Goals ? ?OT Goals(current goals can now be found in the care  plan section) ? Progress towards OT goals: Progressing toward goals ? ?Acute Rehab OT Goals ?Patient Stated Goal: decrease pain ?OT Goal Formulation: With patient ?Time For Goal Achievement: 10/09/21 ?Potential to Achieve Goals: Good  ?Plan     ? ?Co-evaluation ? ? ?   ?  ?  ?  ?  ? ?  ?AM-PAC OT "6 Clicks" Daily Activity     ?Outcome Measure ? ? Help from another person eating meals?: None ?Help from another person taking care of personal grooming?: A Little ?Help from another person toileting, which includes using toliet, bedpan, or urinal?: A Lot ?Help from another person bathing (including washing, rinsing, drying)?: A Lot ?Help from another person to put on and taking off regular upper body clothing?: A Lot ?Help from another person to put on and taking off regular lower body clothing?: Total ?6 Click Score: 14 ? ?  ?End of Session   ? ?OT Visit Diagnosis: Pain;Muscle weakness (generalized) (M62.81) ?Pain - Right/Left: Right ?Pain - part of body: Leg ?  ?Activity Tolerance Patient limited by fatigue;Patient limited by pain ?  ?Patient Left in chair;with call bell/phone within reach;with chair alarm set ?  ?Nurse Communication Mobility status;Patient requests pain meds ?  ? ?   ? ?Time: 0102-7253 ?OT Time Calculation (min): 45 min ? ?Charges: OT General Charges ?$OT Visit: 1 Visit ?OT Treatments ?$Self Care/Home Management : 38-52 mins ? ?Alfonzo Beers, OTD, OTR/L ?Acute Rehab ?[phone] ? ? ?Mayer Masker ?09/30/2021, 2:40 PM ?

## 2021-09-30 NOTE — Progress Notes (Signed)
Hypoglycemic Event ? ?CBG: 42 ? ?Treatment: D50 50 mL (25 gm) ? ?Symptoms: drowsy ? ?Follow-up CBG: Time: 0507 CBG Result:189 ? ?Possible Reasons for Event: Unknown ? ?Comments/MD notified:  ? ? ? ?[name], Alessandra Bevels ? ? ?

## 2021-09-30 NOTE — Progress Notes (Addendum)
?  Progress Note ? ? ?Patient: [name] [id number] DOB: Oct 30, 1970 DOA: 09/24/2021     6 ?DOS: the patient was seen and examined on 09/30/2021 ?  ?Brief hospital course: ?51 year old female with a history of peripheral vascular disease status post left AKA, COPD, HTN, DM, admitted to the hospital for critical limb ischemia of the right lower extremity requiring right above-the-knee palpation.  Patient underwent surgery on 3/1. Postoperatively, she has been having significant pain and has been started on a PCA. ? ? ?Assessment and Plan: ? ?PAD (peripheral artery disease) (HCC) ?On PCA pump for intractable pain post op ?Patient with prior L AKA ?Currently s/p R AKA on 3/1, after aortogram demonstrated no viable revascularization targets ?Continue ASA and Plavix ?Management per vascular surgery ?PT/OT with recommendations for SNF placement ? ?Leukocytosis ?Noted to be chronic  ?Currently afebrile, with no signs of infection ?Daily CBC ? ?Hyponatremia ?Improving ?Likely SIADH ?Plasma osmolality 268, urine sodium 19, urine osmolality 162 ?Nephrology consulted, 1.5 L fluid restriction ?Daily BMP ? ?Hypomagnesemia ?Replace as needed ? ?Iron def/Vitamin B12 def/folic acid def anemia ?Multifactorial, recent surgery, hemodilution with IV fluids, iron def ?Anemia panel iron 18, TIBC 249, sat 7%, ferritin 72, folate 5, vitamin B12-->87 ?Start oral iron, vitamin B12, folic acid supplementation ?Daily CBC ? ?CKD (chronic kidney disease) stage 3a, GFR 30-59 ml/min (HCC) ?Baseline creatinine of 1.2 (from care everyone), around baseline ?Daily BMP ? ?Tobacco dependence ?Advised to quit ?Patch ordered  ? ?COPD(HCC) ?Stable ? ?Constipation ?Continue on Linzess, start MiraLAX and senna BID ? ?Hypertension ?Continue Coreg, IV hydralazine prn ?Hold Lisinopril ? ?Diabetes mellitus (HCC) ?Noted hypoglycemia on 3/6 and 3/7 ?A1C 10.0 ?Continue SSI, adjusted Semglee, Accu-Cheks, hypoglycemic protocol, adjust regimen pending CBGs ?Hold  Glucophage, glucotrol ? ?Obesity ?Lifestyle modification advised ? ?Psychiatry consult for possible depression ? ? ? ?Subjective:  ?Noted to be intermittently very teary about overall medical condition. has never seen a MH physician and wants to talk to someone. No SI/HI ? ? ?Physical Exam: ?Vitals:  ? 09/30/21 0530 09/30/21 0739 09/30/21 0858 09/30/21 1258  ?BP:  (!) 147/78    ?Pulse:  96    ?Resp:  20 18 20   ?Temp:  (!) 97.4 ?F (36.3 ?C)    ?TempSrc:  Oral    ?SpO2: 98% 99% 100% 100%  ?Weight:      ?Height:      ? ?General: NAD ?Cardiovascular: S1, S2 present ?Respiratory: CTAB ?Abdomen: Soft, nontender, nondistended, bowel sounds present ?Musculoskeletal: BLE AKA amputations, R AKA with wound vac ?Skin: Normal ?Psychiatry: Teary mood  ? ? ? ?Data Reviewed: ? ?Reviewed CBC, chemistry ? ?Family Communication: None at the bedside ? ?Disposition: ?Status is: Inpatient ?Remains inpatient appropriate because: Needs continued postoperative care as well as skilled nursing facility placement ? ? ? ? ? ?Planned Discharge Destination: Skilled nursing facility ? ?DVT Prophylaxis  ., Heparin injection 5,000 units ? ? ? ? ?Author: ? , MD ?09/30/2021 2:45 PM ? ?For on call review [email or web address].  ? ?

## 2021-10-01 DIAGNOSIS — R5381 Other malaise: Secondary | ICD-10-CM

## 2021-10-01 LAB — BASIC METABOLIC PANEL
Anion gap: 10 (ref 5–15)
BUN: 28 mg/dL — ABNORMAL HIGH (ref 6–20)
CO2: 19 mmol/L — ABNORMAL LOW (ref 22–32)
Calcium: 8.5 mg/dL — ABNORMAL LOW (ref 8.9–10.3)
Chloride: 101 mmol/L (ref 98–111)
Creatinine, Ser: 1.49 mg/dL — ABNORMAL HIGH (ref 0.44–1.00)
GFR, Estimated: 42 mL/min — ABNORMAL LOW (ref >60)
Glucose, Bld: 137 mg/dL — ABNORMAL HIGH (ref 70–99)
Potassium: 5 mmol/L (ref 3.5–5.1)
Sodium: 130 mmol/L — ABNORMAL LOW (ref 135–145)

## 2021-10-01 LAB — CBC WITH DIFFERENTIAL/PLATELET
Abs Immature Granulocytes: 0.14 10*3/uL — ABNORMAL HIGH (ref 0.00–0.07)
Basophils Absolute: 0.1 10*3/uL (ref 0.0–0.1)
Basophils Relative: 0 %
Eosinophils Absolute: 0.2 10*3/uL (ref 0.0–0.5)
Eosinophils Relative: 2 %
HCT: 24.2 % — ABNORMAL LOW (ref 36.0–46.0)
Hemoglobin: 7.9 g/dL — ABNORMAL LOW (ref 12.0–15.0)
Immature Granulocytes: 1 %
Lymphocytes Relative: 14 %
Lymphs Abs: 1.8 10*3/uL (ref 0.7–4.0)
MCH: 31.3 pg (ref 26.0–34.0)
MCHC: 32.6 g/dL (ref 30.0–36.0)
MCV: 96 fL (ref 80.0–100.0)
Monocytes Absolute: 0.9 10*3/uL (ref 0.1–1.0)
Monocytes Relative: 7 %
Neutro Abs: 9.6 10*3/uL — ABNORMAL HIGH (ref 1.7–7.7)
Neutrophils Relative %: 76 %
Platelets: 324 10*3/uL (ref 150–400)
RBC: 2.52 MIL/uL — ABNORMAL LOW (ref 3.87–5.11)
RDW: 14.2 % (ref 11.5–15.5)
WBC: 12.6 10*3/uL — ABNORMAL HIGH (ref 4.0–10.5)
nRBC: 0 % (ref 0.0–0.2)

## 2021-10-01 LAB — GLUCOSE, CAPILLARY
Glucose-Capillary: 157 mg/dL — ABNORMAL HIGH (ref 70–99)
Glucose-Capillary: 188 mg/dL — ABNORMAL HIGH (ref 70–99)
Glucose-Capillary: 58 mg/dL — ABNORMAL LOW (ref 70–99)
Glucose-Capillary: 79 mg/dL (ref 70–99)
Glucose-Capillary: 54 mg/dL — ABNORMAL LOW (ref 70–99)
Glucose-Capillary: 117 mg/dL — ABNORMAL HIGH (ref 70–99)

## 2021-10-01 MED ORDER — HYDROMORPHONE HCL 1 MG/ML IJ SOLN
1.0000 mg | INTRAMUSCULAR | Status: DC | PRN
  Administered 2021-10-01 (×2): 1 mg via INTRAVENOUS
  Filled 2021-10-01 (×2): qty 1

## 2021-10-01 NOTE — Assessment & Plan Note (Signed)
Now with bilateral AKA.  Was previously moving with the help of wheelchair.  PT/OT recommending/oxygen discharge. ?

## 2021-10-01 NOTE — Consult Note (Signed)
Brief Psychiatry Consult Note  ?Consult placed to psychiatry as pt was tearful and wanted to "talk to someone". Reached out to primary team to see if pt is open to medication management as therapy is not offered by the psychiatric consult service. There are no safety concerns (SI, etc) documented in the chart.  ? ?- please ask pt if she is open to med management; will f/u tomorrow with primary team ?- otherwise, please place chaplain consult and consult to [facility] for outpt therapy and resources.  ? ? ?[name] A [name] ? ?

## 2021-10-01 NOTE — TOC Progression Note (Addendum)
Transition of Care (TOC) - Progression Note  ? ? ?Patient Details  ?Name: [name] ?MRN: [id number] ?Date of Birth: [birth year] ? ?Transition of Care (TOC) CM/SW Contact  ?Lorri Frederick, LCSW ?Phone Number: ?10/01/2021, 8:26 AM ? ?Clinical Narrative:   Level 2 passr received: [id number] A. ? ?0940: CSW gave bed offers to pt, she chooses [facility]. Revonda Standard at [facility] informed and can accept pt today or tomorrow.  ? ?Expected Discharge Plan: Skilled Nursing Facility ?Barriers to Discharge: Continued Medical Work up, SNF Pending bed offer ? ?Expected Discharge Plan and Services ?Expected Discharge Plan: Skilled Nursing Facility ?In-house Referral: Clinical Social Work ?  ?Post Acute Care Choice: Skilled Nursing Facility ?Living arrangements for the past 2 months: Mobile Home ?                ?  ?  ?  ?  ?  ?  ?  ?  ?  ?  ? ? ?Social Determinants of Health (SDOH) Interventions ?  ? ?Readmission Risk Interventions ?No flowsheet data found. ? ?

## 2021-10-01 NOTE — Progress Notes (Incomplete)
Hypoglycemic Event ? ?CBG: 58 ? ?Treatment: 4 oz juice/soda ? ?Symptoms: None ? ?Follow-up CBG: Time:*** CBG Result:*** ? ?Possible Reasons for Event: {Possible Reasons for Event:[id number]} ? ?Comments/MD notified:*** ? ? ? ?[name] ? ? ?

## 2021-10-01 NOTE — Progress Notes (Signed)
Occupational Therapy Treatment ?Patient Details ?Name: [name] ?MRN: [id number] ?DOB: 07-20-1971 ?Today's Date: 10/01/2021 ? ? ?History of present illness Pt is a 51 y.o. female admitted 09/24/21 for R above knee amputation. PMH includes COPD, DM, HTN, PVD, PE, anxiety, depression, L AKA (pt reports 2021). ?  ?OT comments ? Pt was instructed in HEP for bil. UE strengthening.  Instructed her to perform 2x/day and she verbalized understanding.  She deferred transfers or ADLs this date due to increased pain.   ? ?Recommendations for follow up therapy are one component of a multi-disciplinary discharge planning process, led by the attending physician.  Recommendations may be updated based on patient status, additional functional criteria and insurance authorization. ?   ?Follow Up Recommendations ? Skilled nursing-short term rehab (<3 hours/day)  ?  ?Assistance Recommended at Discharge Frequent or constant Supervision/Assistance  ?Patient can return home with the following ? A lot of help with walking and/or transfers;A lot of help with bathing/dressing/bathroom;Assistance with cooking/housework;Assist for transportation;Help with stairs or ramp for entrance ?  ?Equipment Recommendations ? Wheelchair cushion (measurements OT)  ?  ?Recommendations for Other Services   ? ?  ?Precautions / Restrictions Restrictions ?Weight Bearing Restrictions: Yes  ? ? ?  ? ?Mobility Bed Mobility ?  ?  ?  ?  ?  ?  ?  ?  ?  ? ?Transfers ?  ?  ?  ?  ?  ?  ?  ?  ?  ?  ?  ?  ?Balance   ?  ?  ?  ?  ?  ?  ?  ?  ?  ?  ?  ?  ?  ?  ?  ?  ?  ?  ?   ? ?ADL either performed or assessed with clinical judgement  ? ?ADL   ?  ?  ?  ?  ?  ?  ?  ?  ?  ?  ?  ?  ?  ?  ?  ?  ?  ?  ?  ?General ADL Comments: Pt deferred functional transfers due to pain ?  ? ?Extremity/Trunk Assessment Upper Extremity Assessment ?Upper Extremity Assessment: Generalized weakness ?  ?  ?  ?  ?  ? ?Vision   ?  ?  ?Perception   ?  ?Praxis   ?  ? ?Cognition Arousal/Alertness:  Awake/alert ?Behavior During Therapy: [facility] for tasks assessed/performed ?Overall Cognitive Status: Within Functional Limits for tasks assessed ?  ?  ?  ?  ?  ?  ?  ?  ?  ?  ?  ?  ?  ?  ?  ?  ?  ?  ?  ?   ?Exercises Exercises: Other exercises, General Upper Extremity ?General Exercises - Upper Extremity ?Shoulder Flexion: Strengthening, Right, Left, 10 reps, Seated, Theraband ?Theraband Level (Shoulder Flexion): Level 2 (Red) ?Shoulder Horizontal ABduction: Strengthening, Right, Left, 10 reps, Seated, Theraband ?Theraband Level (Shoulder Horizontal Abduction): Level 2 (Red) ?Elbow Extension: Strengthening, Right, Left, 10 reps, Seated, Theraband ?Theraband Level (Elbow Extension): Level 2 (Red) ? ?  ?Shoulder Instructions   ? ? ?  ?General Comments    ? ? ?Pertinent Vitals/ Pain       Pain Assessment ?Pain Assessment: Faces ?Faces Pain Scale: Hurts whole lot ?Pain Location: R residual limb ?Pain Descriptors / Indicators: Discomfort, Grimacing, Guarding, Moaning ?Pain Intervention(s): Monitored during session, Patient requesting pain meds-RN notified ? ?Home Living   ?  ?  ?  ?  ?  ?  ?  ?  ?  ?  ?  ?  ?  ?  ?  ?  ?  ?  ? ?  ?  Prior Functioning/Environment    ?  ?  ?  ?   ? ?Frequency ? Min 2X/week  ? ? ? ? ?  ?Progress Toward Goals ? ?OT Goals(current goals can now be found in the care plan section) ? Progress towards OT goals: Progressing toward goals ? ?   ?Plan Discharge plan remains appropriate   ? ?Co-evaluation ? ? ?   ?  ?  ?  ?  ? ?  ?AM-PAC OT "6 Clicks" Daily Activity     ?Outcome Measure ? ? Help from another person eating meals?: None ?Help from another person taking care of personal grooming?: A Little ?Help from another person toileting, which includes using toliet, bedpan, or urinal?: A Lot ?Help from another person bathing (including washing, rinsing, drying)?: A Lot ?Help from another person to put on and taking off regular upper body clothing?: A Lot ?Help from another person to put on and taking off  regular lower body clothing?: Total ?6 Click Score: 14 ? ?  ?End of Session   ? ?OT Visit Diagnosis: Pain;Muscle weakness (generalized) (M62.81) ?Pain - Right/Left: Right ?Pain - part of body: Leg ?  ?Activity Tolerance Patient limited by pain ?  ?Patient Left in bed;with call bell/phone within reach ?  ?Nurse Communication Mobility status;Patient requests pain meds ?  ? ?   ? ?Time: 2951-8841 ?OT Time Calculation (min): 16 min ? ?Charges: OT General Charges ?$OT Visit: 1 Visit ?OT Treatments ?$Therapeutic Exercise: 8-22 mins ? ?Meighan Treto C., OTR/L ?Acute Rehabilitation Services ?Pager [phone] ?Office [phone] ? ? ?Mahesh Sizemore M ?10/01/2021, 1:51 PM ?

## 2021-10-01 NOTE — Plan of Care (Signed)

## 2021-10-01 NOTE — Progress Notes (Signed)
PCA Dcd per orders.  3 mg deliverd with 12 demanded and 10 delivered.  ?

## 2021-10-01 NOTE — Plan of Care (Signed)
  Problem: Education: Goal: Knowledge of General Education information will improve Description: Including pain rating scale, medication(s)/side effects and non-pharmacologic comfort measures Outcome: Progressing   Problem: Health Behavior/Discharge Planning: Goal: Ability to manage health-related needs will improve Outcome: Progressing   Problem: Clinical Measurements: Goal: Ability to maintain clinical measurements within normal limits will improve Outcome: Progressing Goal: Will remain free from infection Outcome: Progressing Goal: Diagnostic test results will improve Outcome: Progressing Goal: Respiratory complications will improve Outcome: Progressing Goal: Cardiovascular complication will be avoided Outcome: Progressing   Problem: Nutrition: Goal: Adequate nutrition will be maintained Outcome: Progressing   Problem: Elimination: Goal: Will not experience complications related to bowel motility Outcome: Progressing Goal: Will not experience complications related to urinary retention Outcome: Progressing   Problem: Safety: Goal: Ability to remain free from injury will improve Outcome: Progressing   Problem: Skin Integrity: Goal: Risk for impaired skin integrity will decrease Outcome: Progressing   

## 2021-10-01 NOTE — Assessment & Plan Note (Signed)
Multifactorial: Postoperative blood loss, CKD.  Hemoglobin currently stable in the range of 7-8.  Continue to monitor.  We will check iron studies. ?

## 2021-10-01 NOTE — Progress Notes (Signed)
PROGRESS NOTE  [name]  ONG:[id number] DOB: 1970/09/08 DOA: 09/24/2021 PCP: Selinda Flavin, MD   Brief Narrative: 51 year old female with a history of peripheral vascular disease status post left AKA, COPD, hypertension and diabetes, is admitted to the hospital for critical limb ischemia of the right lower extremity requiring right above-the-knee palpation.  Patient underwent surgery on 3/1.  Postoperatively, she has been having significant pain and has been started on a PCA.  PCA pump discontinued today.  Waiting for vascular surgery clearance for discharge to skilled nursing facility as recommended by PT/OT   Assessment & Plan:  Principal Problem:   PAD (peripheral artery disease) (HCC) Active Problems:   Diabetes mellitus (HCC)   Hypertension   Normocytic anemia   Constipation   COPD not affecting current episode of care (HCC)   Tobacco dependence   CKD (chronic kidney disease) stage 3, GFR 30-59 ml/min (HCC)   Hyponatremia   Leukocytosis   Debility   Assessment and Plan: * PAD (peripheral artery disease) (HCC) -Patient with prior L AKA presenting for R AKA after aortogram demonstrated no viable revascularization targets -Seen by PT/OT with recommendations for SNF placement -Continue ASA and Plavix -Management per vascular surgery -She is having significant pain at her operative site, started on Dilaudid PCA.  Discontinue Dilaudid PCA today.  Continue pain management  Debility Now with bilateral AKA.  Was previously moving with the help of wheelchair.  PT/OT recommending/oxygen discharge.  Leukocytosis Suspect this is reactive to recent surgery.  She is afebrile and does not appear toxic.  Continue to monitor.  Hyponatremia Suspected to be from SIADH.  Continue fluid restriction.  Sodium level currently stable at 130  CKD (chronic kidney disease) stage 3, GFR 30-59 ml/min (HCC) Baseline creatinine of 1.2 Currently creatinine appears to be near baseline Continue to  monitor  Tobacco dependence -Encourage cessation.   -Patch ordered   COPD not affecting current episode of care (HCC) -Needs to stop smoking -No shortness of breath or wheezing at this time  Constipation Chronically on Linzess Continue bowel regimen  Normocytic anemia Multifactorial: Postoperative blood loss, CKD.  Hemoglobin currently stable in the range of 7-8.  Continue to monitor.  We will check iron studies.  Hypertension -Continue Coreg -Hold Lisinopril and monitor renal function   Diabetes mellitus (HCC) -A1C 10.0 -hold Glucophage, glucotrol -Continue glargine -Cover with moderate-scale SSI -Blood sugars are currently stable  Hyperkalemia-resolved as of 10/01/2021 Holding ACE inhibitor Received a dose of Lokelma today           DVT prophylaxis:heparin injection 5,000 Units Start: 09/25/21 0600     Code Status: Full Code  Family Communication: None at bedside  Patient status:Inpatient  Patient is from :Home  Anticipated discharge to:SNF  Estimated DC date: As soon as cleared by vascular surgery  Consultants: Vascular surgery  Procedures: Right AKA  Antimicrobials:  Anti-infectives (From admission, onward)    Start     Dose/Rate Route Frequency Ordered Stop   09/24/21 1700  ceFAZolin (ANCEF) IVPB 2g/100 mL premix        2 g 200 mL/hr over 30 Minutes Intravenous Every 8 hours 09/24/21 1610 09/25/21 0037   09/24/21 0649  ceFAZolin (ANCEF) IVPB 2g/100 mL premix        2 g 200 mL/hr over 30 Minutes Intravenous 30 min pre-op 09/24/21 0649 09/24/21 0850       Subjective: Patient seen and examined at the bedside this morning.  Hemodynamically stable.  Not in apparent distress.  Appears drowsy, sleepy likely from the effect of Dilaudid PCA.  Denies any worsening pain or shortness of breath.  Objective: Vitals:   10/01/21 0000 10/01/21 0543 10/01/21 0733 10/01/21 0807  BP: 90/62 (!) 112/59 (!) 117/54   Pulse: 100 (!) 104 99   Resp: 19 18 19  18   Temp: 98.2 F (36.8 C) 98.1 F (36.7 C) 98.5 F (36.9 C)   TempSrc: Oral Oral    SpO2: 96% 92% 92% 93%  Weight:      Height:        Intake/Output Summary (Last 24 hours) at 10/01/2021 1027 Last data filed at 10/01/2021 0900 Gross per 24 hour  Intake 240 ml  Output 1400 ml  Net -1160 ml   Filed Weights   09/24/21 0651  Weight: 86.2 kg    Examination:  General exam: Drowsy, sleepy, not in apparent distress, deconditioned HEENT: PERRL Respiratory system:  no wheezes or crackles  Cardiovascular system: S1 & S2 heard, RRR.  Gastrointestinal system: Abdomen is nondistended, soft and nontender. Central nervous system: Alert and oriented Extremities: Bilateral AKA, wound VAC on the right lower extremity Skin: No rashes, no ulcers,no icterus     Data Reviewed: I have personally reviewed following labs and imaging studies  CBC: Recent Labs  Lab 09/27/21 0206 09/28/21 0135 09/29/21 0149 09/30/21 0139 10/01/21 0624  WBC 11.5* 10.9* 11.4* 14.9* 12.6*  NEUTROABS 7.4 6.7 7.2 10.7* 9.6*  HGB 9.3* 8.8* 8.4* 8.9* 7.9*  HCT 27.4* 27.4* 25.4* 26.3* 24.2*  MCV 96.8 98.6 96.2 97.0 96.0  PLT 292 312 308 349 324   Basic Metabolic Panel: Recent Labs  Lab 09/25/21 0133 09/25/21 1652 09/26/21 0150 09/27/21 0206 09/27/21 0900 09/28/21 0135 09/28/21 1250 09/29/21 0149 09/29/21 0747 09/29/21 1418 09/29/21 2028 09/30/21 0139 09/30/21 0851 10/01/21 0624  NA 128*   < > 126* 125*  --  123*   < > 125*   < > 126* 128* 129* 129* 130*  K 5.7*   < > 4.1 4.4  --  5.1  --  4.9  --   --   --  4.8  --  5.0  CL 99   < > 100 99  --  97*  --  97*  --   --   --  100  --  101  CO2 17*   < > 19* 18*  --  20*  --  20*  --   --   --  19*  --  19*  GLUCOSE 271*   < > 74 113*  --  126*  --  63*  --   --   --  42*  --  137*  BUN 18   < > 16 17  --  17  --  20  --   --   --  22*  --  28*  CREATININE 1.23*   < > 1.22* 1.37*  --  1.26*  --  1.30*  --   --   --  1.33*  --  1.49*  CALCIUM 8.1*   < >  8.1* 7.9*  --  8.1*  --  8.3*  --   --   --  8.6*  --  8.5*  MG 1.5*  --  1.4*  --  2.4  --   --   --   --   --   --   --   --   --    < > = values in  this interval not displayed.     Recent Results (from the past 240 hour(s))  SARS Coronavirus 2 (TAT 6-24 hrs)     Status: None   Collection Time: 09/22/21 12:00 AM  Result Value Ref Range Status   SARS Coronavirus 2 RESULT: NEGATIVE  Final    Comment: RESULT: NEGATIVESARS-CoV-2 INTERPRETATION:A NEGATIVE  test result means that SARS-CoV-2 RNA was not present in the specimen above the limit of detection of this test. This does not preclude a possible SARS-CoV-2 infection and should not be used as the  sole basis for patient management decisions. Negative results must be combined with clinical observations, patient history, and epidemiological information. Optimum specimen types and timing for peak viral levels during infections caused by SARS-CoV-2  have not been determined. Collection of multiple specimens or types of specimens may be necessary to detect virus. Improper specimen collection and handling, sequence variability under primers/probes, or organism present below the limit of detection may  lead to false negative results. Positive and negative predictive values of testing are highly dependent on prevalence. False negative test results are more likely when prevalence of disease is high.The expected result is NEGATIVE.Fact S heet for  Healthcare Providers: [employer] Sheet for Patients: https://poole-fre[employer].org/ Reference Range - Negative      Radiology Studies: No results found.  Scheduled Meds:  aspirin EC  81 mg Oral Daily   atorvastatin  40 mg Oral QPM   bisacodyl  10 mg Rectal Once   carvedilol  3.125 mg Oral BID WC   clopidogrel  75 mg Oral Daily   docusate sodium  100 mg Oral Daily   ferrous sulfate  325 mg Oral Q breakfast   folic acid  1 mg Oral Daily   gabapentin   400 mg Oral TID   heparin  5,000 Units Subcutaneous Q8H   insulin aspart  0-5 Units Subcutaneous QHS   insulin aspart  0-9 Units Subcutaneous TID WC   insulin glargine-yfgn  20 Units Subcutaneous BID   linaclotide  145 mcg Oral QAC breakfast   living well with diabetes book   Does not apply Once   methocarbamol  500 mg Oral BID   nicotine  14 mg Transdermal Daily   pantoprazole  40 mg Oral Daily   polyethylene glycol  17 g Oral BID   senna-docusate  1 tablet Oral BID   topiramate  25 mg Oral QPM   vitamin B-12  1,000 mcg Oral Daily   Continuous Infusions:  magnesium sulfate bolus IVPB       LOS: 7 days   Burnadette Pop, MD [facility] P3/02/2022, 10:27 AM

## 2021-10-02 DIAGNOSIS — E119 Type 2 diabetes mellitus without complications: Secondary | ICD-10-CM | POA: Diagnosis not present

## 2021-10-02 DIAGNOSIS — R262 Difficulty in walking, not elsewhere classified: Secondary | ICD-10-CM | POA: Diagnosis not present

## 2021-10-02 DIAGNOSIS — R0600 Dyspnea, unspecified: Secondary | ICD-10-CM | POA: Diagnosis not present

## 2021-10-02 DIAGNOSIS — Z760 Encounter for issue of repeat prescription: Secondary | ICD-10-CM | POA: Diagnosis not present

## 2021-10-02 DIAGNOSIS — R41841 Cognitive communication deficit: Secondary | ICD-10-CM | POA: Diagnosis not present

## 2021-10-02 DIAGNOSIS — R52 Pain, unspecified: Secondary | ICD-10-CM | POA: Diagnosis not present

## 2021-10-02 DIAGNOSIS — Z7401 Bed confinement status: Secondary | ICD-10-CM | POA: Diagnosis not present

## 2021-10-02 DIAGNOSIS — I959 Hypotension, unspecified: Secondary | ICD-10-CM | POA: Diagnosis not present

## 2021-10-02 DIAGNOSIS — I739 Peripheral vascular disease, unspecified: Secondary | ICD-10-CM | POA: Diagnosis not present

## 2021-10-02 DIAGNOSIS — E785 Hyperlipidemia, unspecified: Secondary | ICD-10-CM | POA: Diagnosis not present

## 2021-10-02 DIAGNOSIS — F419 Anxiety disorder, unspecified: Secondary | ICD-10-CM | POA: Diagnosis not present

## 2021-10-02 DIAGNOSIS — K5900 Constipation, unspecified: Secondary | ICD-10-CM | POA: Diagnosis not present

## 2021-10-02 DIAGNOSIS — I499 Cardiac arrhythmia, unspecified: Secondary | ICD-10-CM | POA: Diagnosis not present

## 2021-10-02 DIAGNOSIS — Z89612 Acquired absence of left leg above knee: Secondary | ICD-10-CM | POA: Diagnosis not present

## 2021-10-02 DIAGNOSIS — I10 Essential (primary) hypertension: Secondary | ICD-10-CM | POA: Diagnosis not present

## 2021-10-02 DIAGNOSIS — Z4781 Encounter for orthopedic aftercare following surgical amputation: Secondary | ICD-10-CM | POA: Diagnosis not present

## 2021-10-02 DIAGNOSIS — D649 Anemia, unspecified: Secondary | ICD-10-CM | POA: Diagnosis not present

## 2021-10-02 DIAGNOSIS — R1312 Dysphagia, oropharyngeal phase: Secondary | ICD-10-CM | POA: Diagnosis not present

## 2021-10-02 DIAGNOSIS — Z89611 Acquired absence of right leg above knee: Secondary | ICD-10-CM | POA: Diagnosis not present

## 2021-10-02 DIAGNOSIS — J449 Chronic obstructive pulmonary disease, unspecified: Secondary | ICD-10-CM | POA: Diagnosis not present

## 2021-10-02 DIAGNOSIS — R5381 Other malaise: Secondary | ICD-10-CM | POA: Diagnosis not present

## 2021-10-02 DIAGNOSIS — K219 Gastro-esophageal reflux disease without esophagitis: Secondary | ICD-10-CM | POA: Diagnosis not present

## 2021-10-02 DIAGNOSIS — M6281 Muscle weakness (generalized): Secondary | ICD-10-CM | POA: Diagnosis not present

## 2021-10-02 DIAGNOSIS — N183 Chronic kidney disease, stage 3 unspecified: Secondary | ICD-10-CM | POA: Diagnosis not present

## 2021-10-02 DIAGNOSIS — E441 Mild protein-calorie malnutrition: Secondary | ICD-10-CM | POA: Diagnosis not present

## 2021-10-02 LAB — BASIC METABOLIC PANEL
Anion gap: 8 (ref 5–15)
BUN: 26 mg/dL — ABNORMAL HIGH (ref 6–20)
CO2: 20 mmol/L — ABNORMAL LOW (ref 22–32)
Calcium: 8.1 mg/dL — ABNORMAL LOW (ref 8.9–10.3)
Chloride: 98 mmol/L (ref 98–111)
Creatinine, Ser: 1.5 mg/dL — ABNORMAL HIGH (ref 0.44–1.00)
GFR, Estimated: 42 mL/min — ABNORMAL LOW (ref >60)
Glucose, Bld: 97 mg/dL (ref 70–99)
Potassium: 5 mmol/L (ref 3.5–5.1)
Sodium: 126 mmol/L — ABNORMAL LOW (ref 135–145)

## 2021-10-02 LAB — CBC WITH DIFFERENTIAL/PLATELET
Abs Immature Granulocytes: 0.2 10*3/uL — ABNORMAL HIGH (ref 0.00–0.07)
Basophils Absolute: 0.1 10*3/uL (ref 0.0–0.1)
Basophils Relative: 0 %
Eosinophils Absolute: 0.5 10*3/uL (ref 0.0–0.5)
Eosinophils Relative: 3 %
HCT: 22.5 % — ABNORMAL LOW (ref 36.0–46.0)
Hemoglobin: 7.4 g/dL — ABNORMAL LOW (ref 12.0–15.0)
Immature Granulocytes: 1 %
Lymphocytes Relative: 18 %
Lymphs Abs: 2.5 10*3/uL (ref 0.7–4.0)
MCH: 31.6 pg (ref 26.0–34.0)
MCHC: 32.9 g/dL (ref 30.0–36.0)
MCV: 96.2 fL (ref 80.0–100.0)
Monocytes Absolute: 1.1 10*3/uL — ABNORMAL HIGH (ref 0.1–1.0)
Monocytes Relative: 7 %
Neutro Abs: 10 10*3/uL — ABNORMAL HIGH (ref 1.7–7.7)
Neutrophils Relative %: 71 %
Platelets: 318 10*3/uL (ref 150–400)
RBC: 2.34 MIL/uL — ABNORMAL LOW (ref 3.87–5.11)
RDW: 14.4 % (ref 11.5–15.5)
WBC: 14.3 10*3/uL — ABNORMAL HIGH (ref 4.0–10.5)
nRBC: 0 % (ref 0.0–0.2)

## 2021-10-02 LAB — GLUCOSE, CAPILLARY
Glucose-Capillary: 119 mg/dL — ABNORMAL HIGH (ref 70–99)
Glucose-Capillary: 128 mg/dL — ABNORMAL HIGH (ref 70–99)
Glucose-Capillary: 134 mg/dL — ABNORMAL HIGH (ref 70–99)
Glucose-Capillary: 71 mg/dL (ref 70–99)
Glucose-Capillary: 76 mg/dL (ref 70–99)
Glucose-Capillary: 83 mg/dL (ref 70–99)

## 2021-10-02 LAB — IRON AND TIBC
Iron: 23 ug/dL — ABNORMAL LOW (ref 28–170)
Saturation Ratios: 10 % — ABNORMAL LOW (ref 10.4–31.8)
TIBC: 225 ug/dL — ABNORMAL LOW (ref 250–450)
UIBC: 202 ug/dL

## 2021-10-02 MED ORDER — POLYETHYLENE GLYCOL 3350 17 G PO PACK: 17.0000 g | PACK | Freq: Every day | ORAL | 0 refills | Status: DC

## 2021-10-02 MED ORDER — OXYCODONE-ACETAMINOPHEN 5-325 MG PO TABS: 1.0000 | ORAL_TABLET | ORAL | 0 refills | Status: DC | PRN

## 2021-10-02 MED ORDER — ALPRAZOLAM 0.5 MG PO TABS: 0.5000 mg | ORAL_TABLET | Freq: Three times a day (TID) | ORAL | 0 refills | Status: AC | PRN

## 2021-10-02 MED ORDER — SODIUM CHLORIDE 1 G PO TABS: 2.0000 g | ORAL_TABLET | Freq: Two times a day (BID) | ORAL | 0 refills | Status: AC

## 2021-10-02 MED ORDER — SODIUM CHLORIDE 1 G PO TABS
2.0000 g | ORAL_TABLET | Freq: Two times a day (BID) | ORAL | Status: DC
  Administered 2021-10-02: 13:00:00 2 g via ORAL
  Filled 2021-10-02 (×4): qty 2

## 2021-10-02 MED ORDER — CYANOCOBALAMIN 1000 MCG PO TABS: 1000.0000 ug | ORAL_TABLET | Freq: Every day | ORAL | Status: AC

## 2021-10-02 MED ORDER — FOLIC ACID 1 MG PO TABS: 1.0000 mg | ORAL_TABLET | Freq: Every day | ORAL | Status: DC

## 2021-10-02 MED ORDER — SODIUM CHLORIDE 0.9 % IV SOLN
510.0000 mg | Freq: Once | INTRAVENOUS | Status: AC
  Administered 2021-10-02: 15:00:00 510 mg via INTRAVENOUS
  Filled 2021-10-02: qty 17

## 2021-10-02 MED ORDER — GABAPENTIN 400 MG PO CAPS: 400.0000 mg | ORAL_CAPSULE | Freq: Three times a day (TID) | ORAL | Status: DC

## 2021-10-02 MED ORDER — NICOTINE 14 MG/24HR TD PT24: 14.0000 mg | MEDICATED_PATCH | Freq: Every day | TRANSDERMAL | 0 refills | Status: DC

## 2021-10-02 MED ORDER — FERROUS SULFATE 325 (65 FE) MG PO TABS
325.0000 mg | ORAL_TABLET | Freq: Every day | ORAL | 3 refills | Status: DC
Start: 2021-10-03 — End: 2022-04-13

## 2021-10-02 MED ORDER — PANTOPRAZOLE SODIUM 40 MG PO TBEC: 40.0000 mg | DELAYED_RELEASE_TABLET | Freq: Every day | ORAL | Status: AC

## 2021-10-02 NOTE — Progress Notes (Signed)
?  Progress Note ? ? ? ?10/02/2021 ?10:10 AM ?8 Days Post-Op ? ?Subjective:  no complaints ? ? ?Vitals:  ? 10/02/21 0528 10/02/21 0743  ?BP: 93/62 (!) 104/51  ?Pulse: 98 60  ?Resp: 18 19  ?Temp: 98.1 ?F (36.7 ?C) 97.6 ?F (36.4 ?C)  ?SpO2: 90% 98%  ? ?Physical Exam: ?Incisions:   ? ? ?CBC ?   ?Component Value Date/Time  ? WBC 14.3 (H) 10/02/2021 0201  ? RBC 2.34 (L) 10/02/2021 0201  ? HGB 7.4 (L) 10/02/2021 0201  ? HCT 22.5 (L) 10/02/2021 0201  ? PLT 318 10/02/2021 0201  ? MCV 96.2 10/02/2021 0201  ? MCH 31.6 10/02/2021 0201  ? MCHC 32.9 10/02/2021 0201  ? RDW 14.4 10/02/2021 0201  ? LYMPHSABS 2.5 10/02/2021 0201  ? MONOABS 1.1 (H) 10/02/2021 0201  ? EOSABS 0.5 10/02/2021 0201  ? BASOSABS 0.1 10/02/2021 0201  ? ? ?BMET ?   ?Component Value Date/Time  ? NA 126 (L) 10/02/2021 0201  ? K 5.0 10/02/2021 0201  ? CL 98 10/02/2021 0201  ? CO2 20 (L) 10/02/2021 0201  ? GLUCOSE 97 10/02/2021 0201  ? BUN 26 (H) 10/02/2021 0201  ? CREATININE 1.50 (H) 10/02/2021 0201  ? CREATININE 1.09 02/21/2014 1729  ? CALCIUM 8.1 (L) 10/02/2021 0201  ? GFRNONAA 42 (L) 10/02/2021 0201  ? GFRAA 77 (L) 09/06/2014 1115  ? ? ?INR ?   ?Component Value Date/Time  ? INR 0.9 09/24/2021 7793  ? ? ? ?Intake/Output Summary (Last 24 hours) at 10/02/2021 1010 ?Last data filed at 10/01/2021 1700 ?Gross per 24 hour  ?Intake 480 ml  ?Output 1400 ml  ?Net -920 ml  ? ? ? ?Assessment/Plan:  51 y.o. female is s/p R AKA 8 Days Post-Op  ? ?R AKA healing well ?She is scheduled for staple removal in [place] office in 2-3 weeks ?Ok for discharge to SNF ? ? ?Emilie Rutter, PA-C ?[facility] ?[phone] ?10/02/2021 ?10:10 AM ? ? ? ?

## 2021-10-02 NOTE — Progress Notes (Signed)
PT Cancellation Note ? ?Patient Details ?Name: FILIPPA YARBOUGH ?MRN: [id number] ?DOB: August 08, 1970 ? ? ?Cancelled Treatment:    Reason Eval/Treat Not Completed: Fatigue/lethargy limiting ability to participate;Patient declined, no reason specified, per RN patient not appropriate in AM (lethargy, O2 sats) pt declining in PM. ? ? ?Marlana Salvage Rakhi Romagnoli ?10/02/2021, 1:31 PM ?

## 2021-10-02 NOTE — TOC Transition Note (Addendum)
Transition of Care (TOC) - CM/SW Discharge Note ? ? ?Patient Details  ?Name: [name] ?MRN: [id number] ?Date of Birth: 11/21/1970 ? ?Transition of Care [facility]) CM/SW Contact:  ?Joanne Chars, LCSW ?Phone Number: ?10/02/2021, 11:11 AM ? ? ?Clinical Narrative:  Pt discharging to [facility], [phone]. RN call report to [phone].  ? ? ? ?Final next level of care: [place] ?Barriers to Discharge: Barriers Resolved ? ? ?Patient Goals and CMS Choice ?Patient states their goals for this hospitalization and ongoing recovery are:: no goal stated ?  ?  ? ?Discharge Placement ?  ?           ?Patient chooses bed at:  [facility]) ?Patient to be transferred to facility by: [facility] ?Name of family member notified: sister Enid Derry ?Patient and family notified of of transfer: 10/02/21 ? ?Discharge Plan and Services ?In-house Referral: Clinical Social Work ?  ?Post Acute Care Choice: [place]          ?  ?  ?  ?  ?  ?  ?  ?  ?  ?  ? ?Social Determinants of Health (SDOH) Interventions ?  ? ? ?Readmission Risk Interventions ?No flowsheet data found. ? ? ? ? ?

## 2021-10-02 NOTE — Discharge Summary (Signed)
Physician Discharge Summary  [name] ZOX:[id number] DOB: 1971-01-23 DOA: 09/24/2021  PCP: Selinda Flavin, MD  Admit date: 09/24/2021 Discharge date: 10/02/2021  Admitted From: Home Disposition:  SNF  Discharge Condition:Stable CODE STATUS:FULL Diet recommendation:Carb Modified   Brief/Interim Summary:  51 year old female with a history of peripheral vascular disease status post left AKA, COPD, hypertension and diabetes, is admitted to the hospital for critical limb ischemia of the right lower extremity requiring right above-the-knee palpation.  Patient underwent surgery on 3/1.  Postoperatively, she has been having significant pain and has been started on a PCA.  PCA pump discontinued now.  PT/OT recommending skilled nursing facility on discharge.  Medically stable for discharge.  She will follow-up with vascular surgery as an outpatient.  Following problems were addressed during her hospitalization:  PAD (peripheral artery disease) (HCC) -Patient with prior L AKA presenting for R AKA after aortogram demonstrated no viable revascularization targets -Seen by PT/OT with recommendations for SNF placement -Continue ASA and Plavix -F/U with  vascular surgery   Debility Now with bilateral AKA.  Was previously moving with the help of wheelchair.  PT/OT recommending SNF on discharge.   Leukocytosis Suspect this is reactive to recent surgery.  She is afebrile and does not appear toxic. Check CBC in a week   Hyponatremia Suspected to be from SIADH.  Continue fluid restriction.  Sodium level dropped to 126 today.  Started on salt tablets.  Check BMP in a week to check sodium level   CKD (chronic kidney disease) stage 3, GFR 30-59 ml/min (HCC) Baseline creatinine of 1.2 Currently creatinine appears to be near baseline   Tobacco dependence -Encourage cessation.   -Patch ordered    COPD not affecting current episode of care (HCC) -Needs to stop smoking -No shortness of breath or  wheezing at this time   Constipation Chronically on Linzess Continue bowel regimen   Normocytic anemia Multifactorial: Postoperative blood loss, CKD.  Hemoglobin currently stable in the range of 7-8.  Check CBC in a week   Hypertension -Continue Coreg -Hold Lisinopril  -BP soft,if remains soft ,can hold coreg too    Diabetes mellitus (HCC) -A1C 10.0 -Continue home regimen     Discharge Diagnoses:  Principal Problem:   PAD (peripheral artery disease) (HCC) Active Problems:   Diabetes mellitus (HCC)   Hypertension   Normocytic anemia   Constipation   COPD not affecting current episode of care (HCC)   Tobacco dependence   CKD (chronic kidney disease) stage 3, GFR 30-59 ml/min (HCC)   Hyponatremia   Leukocytosis   Debility    Discharge Instructions  Discharge Instructions     Diet Carb Modified   Complete by: As directed    Discharge instructions   Complete by: As directed    1)Please take prescribed medications as instructed 2)Do a CBC and BMP tests in a week 3)Follow up with vascular surgery as an outpatient   Increase activity slowly   Complete by: As directed    No wound care   Complete by: As directed       Allergies as of 10/02/2021   No Active Allergies      Medication List     STOP taking these medications    lisinopril 5 MG tablet Commonly known as: ZESTRIL       TAKE these medications    ALPRAZolam 0.5 MG tablet Commonly known as: XANAX Take 1 tablet (0.5 mg total) by mouth 3 (three) times daily as needed for anxiety.  aspirin EC 81 MG tablet Take 81 mg by mouth daily. Swallow whole.   atorvastatin 40 MG tablet Commonly known as: LIPITOR Take 40 mg by mouth every evening.   Basaglar KwikPen 100 UNIT/ML Inject 40 Units into the skin 2 (two) times daily.   carvedilol 3.125 MG tablet Commonly known as: COREG Take 3.125 mg by mouth 2 (two) times daily with a meal.   clopidogrel 75 MG tablet Commonly known as: PLAVIX Take 75  mg by mouth daily.   cyanocobalamin 1000 MCG tablet Take 1 tablet (1,000 mcg total) by mouth daily. Start taking on: October 03, 2021   docusate sodium 100 MG capsule Commonly known as: COLACE Take 100 mg by mouth daily.   ferrous sulfate 325 (65 FE) MG tablet Take 1 tablet (325 mg total) by mouth daily with breakfast. Start taking on: October 03, 2021   folic acid 1 MG tablet Commonly known as: FOLVITE Take 1 tablet (1 mg total) by mouth daily. Start taking on: October 03, 2021   gabapentin 400 MG capsule Commonly known as: NEURONTIN Take 1 capsule (400 mg total) by mouth 3 (three) times daily. What changed: when to take this   glipiZIDE 10 MG tablet Commonly known as: GLUCOTROL Take 10 mg by mouth 2 (two) times daily before a meal.   linaclotide 72 MCG capsule Commonly known as: LINZESS Take 1 capsule (72 mcg total) by mouth daily before breakfast.   metFORMIN 1000 MG tablet Commonly known as: GLUCOPHAGE Take 1,000 mg by mouth 2 (two) times daily.   methocarbamol 500 MG tablet Commonly known as: ROBAXIN Take 500 mg by mouth 2 (two) times daily.   nicotine 14 mg/24hr patch Commonly known as: NICODERM CQ - dosed in mg/24 hours Place 1 patch (14 mg total) onto the skin daily. Start taking on: October 03, 2021   ondansetron 4 MG tablet Commonly known as: ZOFRAN Take 4 mg by mouth every 6 (six) hours as needed for nausea or vomiting.   oxyCODONE-acetaminophen 5-325 MG tablet Commonly known as: Percocet Take 1 tablet by mouth every 4 (four) hours as needed for severe pain.   pantoprazole 40 MG tablet Commonly known as: PROTONIX Take 1 tablet (40 mg total) by mouth daily.   polyethylene glycol 17 g packet Commonly known as: MIRALAX / GLYCOLAX Take 17 g by mouth daily.   sodium chloride 1 g tablet Take 2 tablets (2 g total) by mouth 2 (two) times daily with a meal for 5 days.   topiramate 25 MG tablet Commonly known as: TOPAMAX Take 25 mg by mouth every evening.         Contact information for after-discharge care     Destination     [facility] .   Service: Skilled Nursing Contact information: [address] [place] [address] [phone]                    No Active Allergies  Consultations: Vascular surgery   Procedures/Studies: PERIPHERAL VASCULAR CATHETERIZATION  Result Date: 09/07/2021 DATE OF SERVICE: 09/05/2021  PATIENT:  [name]  52 y.o. female  PRE-OPERATIVE DIAGNOSIS:  Atherosclerosis of native and bypassed arteries of right lower extremity causing rest pain  POST-OPERATIVE DIAGNOSIS:  Same  PROCEDURE:  1) Ultrasound guided bilateral common femoral artery access 2) Aortogram 3) Right lower extremity angiogram (30mL total contrast) 4) Conscious sedation (31 minutes)  SURGEON:  Rande Brunt. Lenell Antu, MD  ASSISTANT: none  ANESTHESIA:   local and IV sedation  ESTIMATED BLOOD LOSS: minimal  LOCAL MEDICATIONS USED:  LIDOCAINE  COUNTS: confirmed correct.  PATIENT DISPOSITION:  PACU - hemodynamically stable.  Delay start of Pharmacological VTE agent (>24hrs) due to surgical blood loss or risk of bleeding: no  INDICATION FOR PROCEDURE: AYANI OSPINA is a 51 y.o. female with ischemic rest pain of the right foot. She has severe PAD by non-invasive testing. She has a history of a right femoral-above knee popliteal bypass with goretex graft. After careful discussion of risks, benefits, and alternatives the patient was offered angiogram. The patient understood and wished to proceed.  OPERATIVE FINDINGS: Terminal aorta and iliac arteries: Occluded left common iliac artery Diffusely diseased arteries which are diminutive and heavily calcified throughout  Right lower extremity: Diffusely diseased arteries which are diminutive and heavily calcified throughout Common femoral artery: Patent.  Heavily diseased.  Diminutive. Profunda femoris artery: Patent.  Heavily diseased.  Diminutive.  Superficial femoral artery: Occluded Popliteal artery: Severe stenosis throughout (90% or greater), but patent to the takeoff of the anterior tibial artery. Anterior tibial artery: Patent from origin to mid calf.  The artery abruptly occludes about the ankle and fills collaterals into the ankle. Tibioperoneal trunk: Occluded Peroneal artery: Occluded Posterior tibial artery: Occluded Pedal circulation: Severely disadvantaged.  No contrast visualized into the foot.  DESCRIPTION OF PROCEDURE: After identification of the patient in the pre-operative holding area, the patient was transferred to the operating room. The patient was positioned supine on the operating room table. Anesthesia was induced. The groins was prepped and draped in standard fashion. A surgical pause was performed confirming correct patient, procedure, and operative location.  The left groin was anesthetized with subcutaneous injection of 1% lidocaine. Using ultrasound guidance, the left common femoral artery was accessed with micropuncture technique. Fluoroscopy was used to confirm cannulation over the femoral head.  Retrograde angiography was performed.  This showed occlusion of the left common iliac artery.  The right groin was anesthetized with subcutaneous injection of 1% lidocaine. Using ultrasound guidance, the left common femoral artery was accessed with micropuncture technique. Fluoroscopy was used to confirm cannulation over the femoral head  A Benson wire was advanced into the distal aorta. Over the wire an omni flush catheter was advanced to the level of L2. Aortogram was performed - see above for details.  Retrograde angiography was then performed of the right lower extremity through the sheath.  Angiography was performed in stations.  See above for details.  After completion of the study, the sheath was left in place, to be removed in the recovery area.   Conscious sedation was administered with the use of IV fentanyl and midazolam  under continuous physician and nurse monitoring.  Heart rate, blood pressure, and oxygen saturation were continuously monitored.  Total sedation time was 31 minutes  Upon completion of the case instrument and sharps counts were confirmed correct. The patient was transferred to the PACU in good condition. I was present for all portions of the procedure.  PLAN: No options for revascularization.  No outflow to the foot.  Needs an right above-knee amputation for palliation.  Will prescribe her narcotic pain medicine x1 prescription without refill.  My office will reach out to her to schedule the amputation.  Rande Brunt. Lenell Antu, MD [facility] Phone Number: [phone] 09/05/2021 9:40 AM      Subjective: Patient seen and examined at the bedside this morning.  Hemodynamically stable.  Looks comfortable today.  Denies new complaints.  Medically stable for discharge  Discharge Exam: Vitals:   10/02/21 0528 10/02/21 0743  BP: 93/62 (!) 104/51  Pulse: 98 60  Resp: 18 19  Temp: 98.1 F (36.7 C) 97.6 F (36.4 C)  SpO2: 90% 98%   Vitals:   10/01/21 2020 10/02/21 0437 10/02/21 0528 10/02/21 0743  BP: 92/60 (!) 99/57 93/62 (!) 104/51  Pulse: 100 94 98 60  Resp: Temp: 99.2 F (37.3 C)  98.1 F (36.7 C) 97.6 F (36.4 C)  TempSrc: Oral  Oral Oral  SpO2: 90% (!) 85% 90% 98%  Weight:      Height:        General: Pt is alert, awake, not in acute distress Cardiovascular: RRR, S1/S2 +, no rubs, no gallops Respiratory: CTA bilaterally, no wheezing, no rhonchi Abdominal: Soft, NT, ND, bowel sounds + Extremities: Bilateral AKA    The results of significant diagnostics from this hospitalization (including imaging, microbiology, ancillary and laboratory) are listed below for reference.     Microbiology: No results found for this or any previous visit (from the past 240 hour(s)).   Labs: BNP (last 3 results) No results for input(s): BNP in the  last 8760 hours. Basic Metabolic Panel: Recent Labs  Lab 09/26/21 0150 09/27/21 0206 09/27/21 0900 09/28/21 0135 09/28/21 1250 09/29/21 0149 09/29/21 0747 09/29/21 2028 09/30/21 0139 09/30/21 0851 10/01/21 0624 10/02/21 0201  NA 126*   < >  --  123*   < > 125*   < > 128* 129* 129* 130* 126*  K 4.1   < >  --  5.1  --  4.9  --   --  4.8  --  5.0 5.0  CL 100   < >  --  97*  --  97*  --   --  100  --  101 98  CO2 19*   < >  --  20*  --  20*  --   --  19*  --  19* 20*  GLUCOSE 74   < >  --  126*  --  63*  --   --  42*  --  137* 97  BUN 16   < >  --  17  --  20  --   --  22*  --  28* 26*  CREATININE 1.22*   < >  --  1.26*  --  1.30*  --   --  1.33*  --  1.49* 1.50*  CALCIUM 8.1*   < >  --  8.1*  --  8.3*  --   --  8.6*  --  8.5* 8.1*  MG 1.4*  --  2.4  --   --   --   --   --   --   --   --   --    < > = values in this interval not displayed.   Liver Function Tests: No results for input(s): AST, ALT, ALKPHOS, BILITOT, PROT, ALBUMIN in the last 168 hours. No results for input(s): LIPASE, AMYLASE in the last 168 hours. No results for input(s): AMMONIA in the last 168 hours. CBC: Recent Labs  Lab 09/28/21 0135 09/29/21 0149 09/30/21 0139 10/01/21 0624 10/02/21 0201  WBC 10.9* 11.4* 14.9* 12.6* 14.3*  NEUTROABS 6.7 7.2 10.7* 9.6* 10.0*  HGB 8.8* 8.4* 8.9* 7.9* 7.4*  HCT 27.4* 25.4* 26.3* 24.2* 22.5*  MCV 98.6 96.2 97.0 96.0 96.2  PLT 312 308 349 324 318   Cardiac Enzymes: No results for input(s): CKTOTAL, CKMB, CKMBINDEX, TROPONINI in the last 168 hours. BNP: Invalid input(s): POCBNP CBG: Recent Labs  Lab 10/01/21 2052 10/02/21 0028 10/02/21 0421 10/02/21 0647 10/02/21 0758  GLUCAP 117* 71 83 134* 119*   D-Dimer No results for input(s): DDIMER in the last 72 hours. Hgb A1c No results for input(s): HGBA1C in the last 72 hours. Lipid Profile No results for input(s): CHOL, HDL, LDLCALC, TRIG, CHOLHDL, LDLDIRECT in the last 72 hours. Thyroid function studies No  results for input(s): TSH, T4TOTAL, T3FREE, THYROIDAB in the last 72 hours.  Invalid input(s): FREET3 Anemia work up No results for input(s): VITAMINB12, FOLATE, FERRITIN, TIBC, IRON, RETICCTPCT in the last 72 hours. Urinalysis No results found for: COLORURINE, APPEARANCEUR, LABSPEC, PHURINE, GLUCOSEU, HGBUR, BILIRUBINUR, KETONESUR, PROTEINUR, UROBILINOGEN, NITRITE, LEUKOCYTESUR Sepsis Labs Invalid input(s): PROCALCITONIN,  WBC,  LACTICIDVEN Microbiology No results found for this or any previous visit (from the past 240 hour(s)).  Please note: You were cared for by a hospitalist during your hospital stay. Once you are discharged, your primary care physician will handle any further medical issues. Please note that NO REFILLS for any discharge medications will be authorized once you are discharged, as it is imperative that you return to your primary care physician (or establish a relationship with a primary care physician if you do not have one) for your post hospital discharge needs so that they can reassess your need for medications and monitor your lab values.    Time coordinating discharge: 40 minutes  SIGNED:   Burnadette PopAmrit Theodis Kinsel, MD  [facility] 10/02/2021, 10:57 AM Pager [phone]  If 7PM-7AM, please contact night-coverage [email or web address] Password [email or web address]

## 2021-10-02 NOTE — Progress Notes (Signed)
Orthopedic Tech Progress Note ?Patient Details:  ?[name] ?1970/08/17 ?[id number] ?[employer] order called in for retention sock ?Patient ID: [name], female   DOB: 01-Apr-1971, 51 y.o.   MRN: [id number] ? ?[name] ?10/02/2021, 10:23 AM ? ?

## 2021-10-02 NOTE — Care Management Important Message (Signed)
Important Message ? ?Patient Details  ?Name: [name] ?MRN: [id number] ?Date of Birth: 1971/06/04 ? ? ?Medicare Important Message Given:  Yes ? ? ? ? ?Marylene Land  Aurelius Gildersleeve-Martin ?10/02/2021, 3:03 PM ?

## 2021-10-02 NOTE — Progress Notes (Signed)
Inpatient Diabetes Program Recommendations ? ?AACE/ADA: New Consensus Statement on Inpatient Glycemic Control (2015) ? ?Target Ranges:  Prepandial:   less than 140 mg/dL ?     Peak postprandial:   less than 180 mg/dL (1-2 hours) ?     Critically ill patients:  140 - 180 mg/dL  ? ?Lab Results  ?Component Value Date  ? GLUCAP 128 (H) 10/02/2021  ? HGBA1C 10.0 (H) 09/24/2021  ? ? ?Review of Glycemic Control ? Latest Reference Range & Units 10/01/21 20:52 10/02/21 00:28 10/02/21 04:21 10/02/21 06:47 10/02/21 07:58 10/02/21 11:18  ?Glucose-Capillary 70 - 99 mg/dL 811 (H) 71 83 914 (H) 782 (H) 128 (H)  ? ?Diabetes history: DM 2 ?Outpatient Diabetes medications:  ?Basaglar 40 units bid ?Metformin 1000 mg bid ?Current orders for Inpatient glycemic control:  ?Novolog sensitive tid with meals and HS ?Semglee 20 units bid ? ?Inpatient Diabetes Program Recommendations:   ? ?Attempted to speak with patient regarding A1C.  Patient very sleepy.  Plan is to d/c to SNF. Reminded her of importance of glycemic control.  Patient nodded head but not engaged in conversation.  RN at bedside.  ? ?Thanks,  ?Beryl Meager, RN, BC-ADM ?Inpatient Diabetes Coordinator ?Pager [phone]  (8a-5p) ? ?

## 2021-10-02 NOTE — Consult Note (Signed)
Brief Psychiatry Consult Note  ?Consult placed to psychiatry as pt was tearful and wanted to "talk to someone". Reached out to primary team on 3/8 to see if pt is open to medication management as therapy is not offered by the psychiatric consult service. There are no safety concerns (SI, etc) documented in the chart.  ? ?- have followed up with primary team; pt discharging today and no longer desires to see someone.  ?- likely would not start psychotropic today given worsening hyponatremia ?- if pt desires "someone to talk to", please consult chaplain service ? ?We are signing off. Please do not hesitate to reconsult should additional questions arise.  ? ? ?[name] [name] ? ?

## 2021-10-02 NOTE — Plan of Care (Signed)

## 2021-10-02 NOTE — Progress Notes (Signed)
Called and gave report to Rehab nurse. ?

## 2021-10-06 DIAGNOSIS — R5381 Other malaise: Secondary | ICD-10-CM | POA: Diagnosis not present

## 2021-10-06 DIAGNOSIS — I739 Peripheral vascular disease, unspecified: Secondary | ICD-10-CM | POA: Diagnosis not present

## 2021-10-06 DIAGNOSIS — Z89611 Acquired absence of right leg above knee: Secondary | ICD-10-CM | POA: Diagnosis not present

## 2021-10-06 DIAGNOSIS — Z89612 Acquired absence of left leg above knee: Secondary | ICD-10-CM | POA: Diagnosis not present

## 2021-10-07 DIAGNOSIS — I739 Peripheral vascular disease, unspecified: Secondary | ICD-10-CM | POA: Diagnosis not present

## 2021-10-09 DIAGNOSIS — R52 Pain, unspecified: Secondary | ICD-10-CM | POA: Diagnosis not present

## 2021-10-09 DIAGNOSIS — Z760 Encounter for issue of repeat prescription: Secondary | ICD-10-CM | POA: Diagnosis not present

## 2021-10-09 DIAGNOSIS — F419 Anxiety disorder, unspecified: Secondary | ICD-10-CM | POA: Diagnosis not present

## 2021-10-15 ENCOUNTER — Encounter: Payer: Self-pay | Admitting: Vascular Surgery

## 2021-10-15 ENCOUNTER — Other Ambulatory Visit: Payer: Self-pay

## 2021-10-15 ENCOUNTER — Ambulatory Visit (INDEPENDENT_AMBULATORY_CARE_PROVIDER_SITE_OTHER): Payer: Medicare Other | Admitting: Vascular Surgery

## 2021-10-15 VITALS — BP 114/58 | HR 90 | Temp 100.0°F | Resp 12 | Ht 66.0 in | Wt 191.0 lb

## 2021-10-15 DIAGNOSIS — Z89611 Acquired absence of right leg above knee: Secondary | ICD-10-CM

## 2021-10-15 MED ORDER — OXYCODONE-ACETAMINOPHEN 5-325 MG PO TABS
1.0000 | ORAL_TABLET | ORAL | 0 refills | Status: DC | PRN
Start: 2021-10-15 — End: 2022-12-01

## 2021-10-15 NOTE — Progress Notes (Signed)
? ?[facility] ? ?Patient name: [name] MRN: [id number] DOB: 04-13-1971 Sex: female ? ?REASON FOR VISIT: Status post above-knee ? ?HPI: ?[name] is a 51 y.o. female here today for follow-up.  She had critical limb ischemia which was unreconstructable and underwent a right above-knee amputation with Dr. Lenell Antu on 09/24/2021.  He was discharged to [facility] rehab facility.  She reports both phantom pain and also incisional pain. ? ?Current Outpatient Medications  ?Medication Sig Dispense Refill  ? ALPRAZolam (XANAX) 0.5 MG tablet Take 1 tablet (0.5 mg total) by mouth 3 (three) times daily as needed for anxiety. 10 tablet 0  ? aspirin EC 81 MG tablet Take 81 mg by mouth daily. Swallow whole.    ? atorvastatin (LIPITOR) 40 MG tablet Take 40 mg by mouth every evening.    ? carvedilol (COREG) 3.125 MG tablet Take 3.125 mg by mouth 2 (two) times daily with a meal.    ? clopidogrel (PLAVIX) 75 MG tablet Take 75 mg by mouth daily.    ? docusate sodium (COLACE) 100 MG capsule Take 100 mg by mouth daily.    ? ferrous sulfate 325 (65 FE) MG tablet Take 1 tablet (325 mg total) by mouth daily with breakfast.  3  ? folic acid (FOLVITE) 1 MG tablet Take 1 tablet (1 mg total) by mouth daily.    ? gabapentin (NEURONTIN) 400 MG capsule Take 1 capsule (400 mg total) by mouth 3 (three) times daily.    ? glipiZIDE (GLUCOTROL) 10 MG tablet Take 10 mg by mouth 2 (two) times daily before a meal.    ? Insulin Glargine (BASAGLAR KWIKPEN) 100 UNIT/ML Inject 40 Units into the skin 2 (two) times daily.    ? linaclotide (LINZESS) 72 MCG capsule Take 1 capsule (72 mcg total) by mouth daily before breakfast. 90 capsule 3  ? metFORMIN (GLUCOPHAGE) 1000 MG tablet Take 1,000 mg by mouth 2 (two) times daily.    ? methocarbamol (ROBAXIN) 500 MG tablet Take 500 mg by mouth 2 (two) times daily.    ? nicotine (NICODERM CQ - DOSED IN MG/24 HOURS) 14 mg/24hr patch Place 1 patch (14 mg  total) onto the skin daily. 28 patch 0  ? ondansetron (ZOFRAN) 4 MG tablet Take 4 mg by mouth every 6 (six) hours as needed for nausea or vomiting.    ? oxyCODONE-acetaminophen (PERCOCET) 5-325 MG tablet Take 1 tablet by mouth every 4 (four) hours as needed for severe pain. 10 tablet 0  ? oxyCODONE-acetaminophen (PERCOCET/ROXICET) 5-325 MG tablet Take 1 tablet by mouth every 4 (four) hours as needed for severe pain. 20 tablet 0  ? pantoprazole (PROTONIX) 40 MG tablet Take 1 tablet (40 mg total) by mouth daily.    ? polyethylene glycol (MIRALAX / GLYCOLAX) 17 g packet Take 17 g by mouth daily. 14 each 0  ? topiramate (TOPAMAX) 25 MG tablet Take 25 mg by mouth every evening.    ? vitamin B-12 1000 MCG tablet Take 1 tablet (1,000 mcg total) by mouth daily.    ? ?No current facility-administered medications for this visit.  ? ? ? ?PHYSICAL EXAM: ?Vitals:  ? 10/15/21 0954  ?BP: (!) 114/58  ?Pulse: 90  ?Resp: 12  ?Temp: 100 ?F (37.8 ?C)  ?TempSrc: Temporal  ?SpO2: 96%  ?Weight: 191 lb (86.6 kg)  ?Height: 5\' 6"  (1.676 m)  ? ? ?GENERAL: The patient is a well-nourished female, in no acute distress. The vital signs are  documented above. ?Her above-knee amputation has healed quite nicely.  There is no evidence of skin breakdown. ? ? ?MEDICAL ISSUES: ?I removed her staples today.  She did have some discomfort associated with this but the suture line looks quite good.  She is receiving narcotic pain medication at [facility] and is to be discharged on 10/22/2021.  She is requesting narcotic for post discharge.  I have sent a prescription to her drugstore for Percocet 11/27/2023 #20 with no refills to be used post discharge.  I have also informed a rehab facility of this.  She will see [place] again on an as-needed basis ? ? ?Larina Earthly, MD FACS ?[facility]  ?Office Tel [phone] ? ?Note: Portions of this report may have been transcribed using voice recognition software.  Every effort has been made  to ensure accuracy; however, inadvertent computerized transcription errors may still be present. ?

## 2021-10-20 DIAGNOSIS — I739 Peripheral vascular disease, unspecified: Secondary | ICD-10-CM | POA: Diagnosis not present

## 2021-10-20 DIAGNOSIS — Z89612 Acquired absence of left leg above knee: Secondary | ICD-10-CM | POA: Diagnosis not present

## 2021-10-20 DIAGNOSIS — Z89611 Acquired absence of right leg above knee: Secondary | ICD-10-CM | POA: Diagnosis not present

## 2021-10-20 DIAGNOSIS — R5381 Other malaise: Secondary | ICD-10-CM | POA: Diagnosis not present

## 2021-10-24 DIAGNOSIS — Z7984 Long term (current) use of oral hypoglycemic drugs: Secondary | ICD-10-CM | POA: Diagnosis not present

## 2021-10-24 DIAGNOSIS — Z89612 Acquired absence of left leg above knee: Secondary | ICD-10-CM | POA: Diagnosis not present

## 2021-10-24 DIAGNOSIS — Z89511 Acquired absence of right leg below knee: Secondary | ICD-10-CM | POA: Diagnosis not present

## 2021-10-24 DIAGNOSIS — E1122 Type 2 diabetes mellitus with diabetic chronic kidney disease: Secondary | ICD-10-CM | POA: Diagnosis not present

## 2021-10-24 DIAGNOSIS — Z794 Long term (current) use of insulin: Secondary | ICD-10-CM | POA: Diagnosis not present

## 2021-10-24 DIAGNOSIS — E1151 Type 2 diabetes mellitus with diabetic peripheral angiopathy without gangrene: Secondary | ICD-10-CM | POA: Diagnosis not present

## 2021-10-24 DIAGNOSIS — N183 Chronic kidney disease, stage 3 unspecified: Secondary | ICD-10-CM | POA: Diagnosis not present

## 2021-10-24 DIAGNOSIS — Z4781 Encounter for orthopedic aftercare following surgical amputation: Secondary | ICD-10-CM | POA: Diagnosis not present

## 2021-10-24 DIAGNOSIS — I129 Hypertensive chronic kidney disease with stage 1 through stage 4 chronic kidney disease, or unspecified chronic kidney disease: Secondary | ICD-10-CM | POA: Diagnosis not present

## 2021-10-27 DIAGNOSIS — E1151 Type 2 diabetes mellitus with diabetic peripheral angiopathy without gangrene: Secondary | ICD-10-CM | POA: Diagnosis not present

## 2021-10-27 DIAGNOSIS — Z89612 Acquired absence of left leg above knee: Secondary | ICD-10-CM | POA: Diagnosis not present

## 2021-10-27 DIAGNOSIS — Z4781 Encounter for orthopedic aftercare following surgical amputation: Secondary | ICD-10-CM | POA: Diagnosis not present

## 2021-10-27 DIAGNOSIS — I129 Hypertensive chronic kidney disease with stage 1 through stage 4 chronic kidney disease, or unspecified chronic kidney disease: Secondary | ICD-10-CM | POA: Diagnosis not present

## 2021-10-27 DIAGNOSIS — Z89511 Acquired absence of right leg below knee: Secondary | ICD-10-CM | POA: Diagnosis not present

## 2021-10-27 DIAGNOSIS — E1122 Type 2 diabetes mellitus with diabetic chronic kidney disease: Secondary | ICD-10-CM | POA: Diagnosis not present

## 2021-10-28 DIAGNOSIS — E1151 Type 2 diabetes mellitus with diabetic peripheral angiopathy without gangrene: Secondary | ICD-10-CM | POA: Diagnosis not present

## 2021-10-28 DIAGNOSIS — I129 Hypertensive chronic kidney disease with stage 1 through stage 4 chronic kidney disease, or unspecified chronic kidney disease: Secondary | ICD-10-CM | POA: Diagnosis not present

## 2021-10-28 DIAGNOSIS — Z89612 Acquired absence of left leg above knee: Secondary | ICD-10-CM | POA: Diagnosis not present

## 2021-10-28 DIAGNOSIS — E1122 Type 2 diabetes mellitus with diabetic chronic kidney disease: Secondary | ICD-10-CM | POA: Diagnosis not present

## 2021-10-28 DIAGNOSIS — Z89511 Acquired absence of right leg below knee: Secondary | ICD-10-CM | POA: Diagnosis not present

## 2021-10-28 DIAGNOSIS — Z4781 Encounter for orthopedic aftercare following surgical amputation: Secondary | ICD-10-CM | POA: Diagnosis not present

## 2021-10-29 DIAGNOSIS — Z89612 Acquired absence of left leg above knee: Secondary | ICD-10-CM | POA: Diagnosis not present

## 2021-10-29 DIAGNOSIS — I129 Hypertensive chronic kidney disease with stage 1 through stage 4 chronic kidney disease, or unspecified chronic kidney disease: Secondary | ICD-10-CM | POA: Diagnosis not present

## 2021-10-29 DIAGNOSIS — E1122 Type 2 diabetes mellitus with diabetic chronic kidney disease: Secondary | ICD-10-CM | POA: Diagnosis not present

## 2021-10-29 DIAGNOSIS — Z89511 Acquired absence of right leg below knee: Secondary | ICD-10-CM | POA: Diagnosis not present

## 2021-10-29 DIAGNOSIS — Z4781 Encounter for orthopedic aftercare following surgical amputation: Secondary | ICD-10-CM | POA: Diagnosis not present

## 2021-10-29 DIAGNOSIS — E1151 Type 2 diabetes mellitus with diabetic peripheral angiopathy without gangrene: Secondary | ICD-10-CM | POA: Diagnosis not present

## 2021-10-31 DIAGNOSIS — Z89511 Acquired absence of right leg below knee: Secondary | ICD-10-CM | POA: Diagnosis not present

## 2021-10-31 DIAGNOSIS — I129 Hypertensive chronic kidney disease with stage 1 through stage 4 chronic kidney disease, or unspecified chronic kidney disease: Secondary | ICD-10-CM | POA: Diagnosis not present

## 2021-10-31 DIAGNOSIS — E1122 Type 2 diabetes mellitus with diabetic chronic kidney disease: Secondary | ICD-10-CM | POA: Diagnosis not present

## 2021-10-31 DIAGNOSIS — Z89612 Acquired absence of left leg above knee: Secondary | ICD-10-CM | POA: Diagnosis not present

## 2021-10-31 DIAGNOSIS — Z4781 Encounter for orthopedic aftercare following surgical amputation: Secondary | ICD-10-CM | POA: Diagnosis not present

## 2021-10-31 DIAGNOSIS — E1151 Type 2 diabetes mellitus with diabetic peripheral angiopathy without gangrene: Secondary | ICD-10-CM | POA: Diagnosis not present

## 2021-11-03 DIAGNOSIS — E1122 Type 2 diabetes mellitus with diabetic chronic kidney disease: Secondary | ICD-10-CM | POA: Diagnosis not present

## 2021-11-03 DIAGNOSIS — I129 Hypertensive chronic kidney disease with stage 1 through stage 4 chronic kidney disease, or unspecified chronic kidney disease: Secondary | ICD-10-CM | POA: Diagnosis not present

## 2021-11-03 DIAGNOSIS — E1151 Type 2 diabetes mellitus with diabetic peripheral angiopathy without gangrene: Secondary | ICD-10-CM | POA: Diagnosis not present

## 2021-11-03 DIAGNOSIS — Z89511 Acquired absence of right leg below knee: Secondary | ICD-10-CM | POA: Diagnosis not present

## 2021-11-03 DIAGNOSIS — Z4781 Encounter for orthopedic aftercare following surgical amputation: Secondary | ICD-10-CM | POA: Diagnosis not present

## 2021-11-03 DIAGNOSIS — Z89612 Acquired absence of left leg above knee: Secondary | ICD-10-CM | POA: Diagnosis not present

## 2021-11-05 DIAGNOSIS — E1122 Type 2 diabetes mellitus with diabetic chronic kidney disease: Secondary | ICD-10-CM | POA: Diagnosis not present

## 2021-11-05 DIAGNOSIS — E1151 Type 2 diabetes mellitus with diabetic peripheral angiopathy without gangrene: Secondary | ICD-10-CM | POA: Diagnosis not present

## 2021-11-05 DIAGNOSIS — Z4781 Encounter for orthopedic aftercare following surgical amputation: Secondary | ICD-10-CM | POA: Diagnosis not present

## 2021-11-05 DIAGNOSIS — Z89612 Acquired absence of left leg above knee: Secondary | ICD-10-CM | POA: Diagnosis not present

## 2021-11-05 DIAGNOSIS — I129 Hypertensive chronic kidney disease with stage 1 through stage 4 chronic kidney disease, or unspecified chronic kidney disease: Secondary | ICD-10-CM | POA: Diagnosis not present

## 2021-11-05 DIAGNOSIS — Z89511 Acquired absence of right leg below knee: Secondary | ICD-10-CM | POA: Diagnosis not present

## 2021-11-07 DIAGNOSIS — Z89511 Acquired absence of right leg below knee: Secondary | ICD-10-CM | POA: Diagnosis not present

## 2021-11-07 DIAGNOSIS — Z89612 Acquired absence of left leg above knee: Secondary | ICD-10-CM | POA: Diagnosis not present

## 2021-11-07 DIAGNOSIS — E1122 Type 2 diabetes mellitus with diabetic chronic kidney disease: Secondary | ICD-10-CM | POA: Diagnosis not present

## 2021-11-07 DIAGNOSIS — I129 Hypertensive chronic kidney disease with stage 1 through stage 4 chronic kidney disease, or unspecified chronic kidney disease: Secondary | ICD-10-CM | POA: Diagnosis not present

## 2021-11-07 DIAGNOSIS — E1151 Type 2 diabetes mellitus with diabetic peripheral angiopathy without gangrene: Secondary | ICD-10-CM | POA: Diagnosis not present

## 2021-11-07 DIAGNOSIS — Z4781 Encounter for orthopedic aftercare following surgical amputation: Secondary | ICD-10-CM | POA: Diagnosis not present

## 2021-11-10 DIAGNOSIS — E1122 Type 2 diabetes mellitus with diabetic chronic kidney disease: Secondary | ICD-10-CM | POA: Diagnosis not present

## 2021-11-10 DIAGNOSIS — I129 Hypertensive chronic kidney disease with stage 1 through stage 4 chronic kidney disease, or unspecified chronic kidney disease: Secondary | ICD-10-CM | POA: Diagnosis not present

## 2021-11-10 DIAGNOSIS — Z89612 Acquired absence of left leg above knee: Secondary | ICD-10-CM | POA: Diagnosis not present

## 2021-11-10 DIAGNOSIS — Z89511 Acquired absence of right leg below knee: Secondary | ICD-10-CM | POA: Diagnosis not present

## 2021-11-10 DIAGNOSIS — Z4781 Encounter for orthopedic aftercare following surgical amputation: Secondary | ICD-10-CM | POA: Diagnosis not present

## 2021-11-10 DIAGNOSIS — E1151 Type 2 diabetes mellitus with diabetic peripheral angiopathy without gangrene: Secondary | ICD-10-CM | POA: Diagnosis not present

## 2021-11-11 DIAGNOSIS — Z89612 Acquired absence of left leg above knee: Secondary | ICD-10-CM | POA: Diagnosis not present

## 2021-11-11 DIAGNOSIS — I129 Hypertensive chronic kidney disease with stage 1 through stage 4 chronic kidney disease, or unspecified chronic kidney disease: Secondary | ICD-10-CM | POA: Diagnosis not present

## 2021-11-11 DIAGNOSIS — Z89511 Acquired absence of right leg below knee: Secondary | ICD-10-CM | POA: Diagnosis not present

## 2021-11-11 DIAGNOSIS — E1151 Type 2 diabetes mellitus with diabetic peripheral angiopathy without gangrene: Secondary | ICD-10-CM | POA: Diagnosis not present

## 2021-11-11 DIAGNOSIS — E1122 Type 2 diabetes mellitus with diabetic chronic kidney disease: Secondary | ICD-10-CM | POA: Diagnosis not present

## 2021-11-11 DIAGNOSIS — Z4781 Encounter for orthopedic aftercare following surgical amputation: Secondary | ICD-10-CM | POA: Diagnosis not present

## 2021-11-13 DIAGNOSIS — Z89511 Acquired absence of right leg below knee: Secondary | ICD-10-CM | POA: Diagnosis not present

## 2021-11-13 DIAGNOSIS — Z89612 Acquired absence of left leg above knee: Secondary | ICD-10-CM | POA: Diagnosis not present

## 2021-11-13 DIAGNOSIS — I129 Hypertensive chronic kidney disease with stage 1 through stage 4 chronic kidney disease, or unspecified chronic kidney disease: Secondary | ICD-10-CM | POA: Diagnosis not present

## 2021-11-13 DIAGNOSIS — E1122 Type 2 diabetes mellitus with diabetic chronic kidney disease: Secondary | ICD-10-CM | POA: Diagnosis not present

## 2021-11-13 DIAGNOSIS — Z4781 Encounter for orthopedic aftercare following surgical amputation: Secondary | ICD-10-CM | POA: Diagnosis not present

## 2021-11-13 DIAGNOSIS — E1151 Type 2 diabetes mellitus with diabetic peripheral angiopathy without gangrene: Secondary | ICD-10-CM | POA: Diagnosis not present

## 2021-11-14 DIAGNOSIS — Z4781 Encounter for orthopedic aftercare following surgical amputation: Secondary | ICD-10-CM | POA: Diagnosis not present

## 2021-11-14 DIAGNOSIS — I129 Hypertensive chronic kidney disease with stage 1 through stage 4 chronic kidney disease, or unspecified chronic kidney disease: Secondary | ICD-10-CM | POA: Diagnosis not present

## 2021-11-14 DIAGNOSIS — E1122 Type 2 diabetes mellitus with diabetic chronic kidney disease: Secondary | ICD-10-CM | POA: Diagnosis not present

## 2021-11-14 DIAGNOSIS — E1151 Type 2 diabetes mellitus with diabetic peripheral angiopathy without gangrene: Secondary | ICD-10-CM | POA: Diagnosis not present

## 2021-11-14 DIAGNOSIS — Z89612 Acquired absence of left leg above knee: Secondary | ICD-10-CM | POA: Diagnosis not present

## 2021-11-14 DIAGNOSIS — Z89511 Acquired absence of right leg below knee: Secondary | ICD-10-CM | POA: Diagnosis not present

## 2021-11-17 DIAGNOSIS — E1122 Type 2 diabetes mellitus with diabetic chronic kidney disease: Secondary | ICD-10-CM | POA: Diagnosis not present

## 2021-11-17 DIAGNOSIS — E1151 Type 2 diabetes mellitus with diabetic peripheral angiopathy without gangrene: Secondary | ICD-10-CM | POA: Diagnosis not present

## 2021-11-17 DIAGNOSIS — Z89612 Acquired absence of left leg above knee: Secondary | ICD-10-CM | POA: Diagnosis not present

## 2021-11-17 DIAGNOSIS — Z89511 Acquired absence of right leg below knee: Secondary | ICD-10-CM | POA: Diagnosis not present

## 2021-11-17 DIAGNOSIS — Z4781 Encounter for orthopedic aftercare following surgical amputation: Secondary | ICD-10-CM | POA: Diagnosis not present

## 2021-11-17 DIAGNOSIS — I129 Hypertensive chronic kidney disease with stage 1 through stage 4 chronic kidney disease, or unspecified chronic kidney disease: Secondary | ICD-10-CM | POA: Diagnosis not present

## 2021-11-19 DIAGNOSIS — Z4781 Encounter for orthopedic aftercare following surgical amputation: Secondary | ICD-10-CM | POA: Diagnosis not present

## 2021-11-19 DIAGNOSIS — Z89511 Acquired absence of right leg below knee: Secondary | ICD-10-CM | POA: Diagnosis not present

## 2021-11-19 DIAGNOSIS — Z89612 Acquired absence of left leg above knee: Secondary | ICD-10-CM | POA: Diagnosis not present

## 2021-11-19 DIAGNOSIS — E1122 Type 2 diabetes mellitus with diabetic chronic kidney disease: Secondary | ICD-10-CM | POA: Diagnosis not present

## 2021-11-19 DIAGNOSIS — I129 Hypertensive chronic kidney disease with stage 1 through stage 4 chronic kidney disease, or unspecified chronic kidney disease: Secondary | ICD-10-CM | POA: Diagnosis not present

## 2021-11-19 DIAGNOSIS — E1151 Type 2 diabetes mellitus with diabetic peripheral angiopathy without gangrene: Secondary | ICD-10-CM | POA: Diagnosis not present

## 2021-11-20 DIAGNOSIS — I129 Hypertensive chronic kidney disease with stage 1 through stage 4 chronic kidney disease, or unspecified chronic kidney disease: Secondary | ICD-10-CM | POA: Diagnosis not present

## 2021-11-20 DIAGNOSIS — Z89612 Acquired absence of left leg above knee: Secondary | ICD-10-CM | POA: Diagnosis not present

## 2021-11-20 DIAGNOSIS — E1151 Type 2 diabetes mellitus with diabetic peripheral angiopathy without gangrene: Secondary | ICD-10-CM | POA: Diagnosis not present

## 2021-11-20 DIAGNOSIS — Z89511 Acquired absence of right leg below knee: Secondary | ICD-10-CM | POA: Diagnosis not present

## 2021-11-20 DIAGNOSIS — E1122 Type 2 diabetes mellitus with diabetic chronic kidney disease: Secondary | ICD-10-CM | POA: Diagnosis not present

## 2021-11-20 DIAGNOSIS — Z4781 Encounter for orthopedic aftercare following surgical amputation: Secondary | ICD-10-CM | POA: Diagnosis not present

## 2021-11-21 DIAGNOSIS — F329 Major depressive disorder, single episode, unspecified: Secondary | ICD-10-CM | POA: Diagnosis not present

## 2021-11-21 DIAGNOSIS — I10 Essential (primary) hypertension: Secondary | ICD-10-CM | POA: Diagnosis not present

## 2021-11-21 DIAGNOSIS — E871 Hypo-osmolality and hyponatremia: Secondary | ICD-10-CM | POA: Diagnosis not present

## 2021-11-21 DIAGNOSIS — R5381 Other malaise: Secondary | ICD-10-CM | POA: Diagnosis not present

## 2021-11-21 DIAGNOSIS — E1165 Type 2 diabetes mellitus with hyperglycemia: Secondary | ICD-10-CM | POA: Diagnosis not present

## 2021-11-21 DIAGNOSIS — D649 Anemia, unspecified: Secondary | ICD-10-CM | POA: Diagnosis not present

## 2021-11-21 DIAGNOSIS — N183 Chronic kidney disease, stage 3 unspecified: Secondary | ICD-10-CM | POA: Diagnosis not present

## 2021-11-21 DIAGNOSIS — I739 Peripheral vascular disease, unspecified: Secondary | ICD-10-CM | POA: Diagnosis not present

## 2021-11-23 DIAGNOSIS — E1151 Type 2 diabetes mellitus with diabetic peripheral angiopathy without gangrene: Secondary | ICD-10-CM | POA: Diagnosis not present

## 2021-11-23 DIAGNOSIS — N183 Chronic kidney disease, stage 3 unspecified: Secondary | ICD-10-CM | POA: Diagnosis not present

## 2021-11-23 DIAGNOSIS — I129 Hypertensive chronic kidney disease with stage 1 through stage 4 chronic kidney disease, or unspecified chronic kidney disease: Secondary | ICD-10-CM | POA: Diagnosis not present

## 2021-11-23 DIAGNOSIS — Z4781 Encounter for orthopedic aftercare following surgical amputation: Secondary | ICD-10-CM | POA: Diagnosis not present

## 2021-11-23 DIAGNOSIS — Z7984 Long term (current) use of oral hypoglycemic drugs: Secondary | ICD-10-CM | POA: Diagnosis not present

## 2021-11-23 DIAGNOSIS — Z89511 Acquired absence of right leg below knee: Secondary | ICD-10-CM | POA: Diagnosis not present

## 2021-11-23 DIAGNOSIS — Z89612 Acquired absence of left leg above knee: Secondary | ICD-10-CM | POA: Diagnosis not present

## 2021-11-23 DIAGNOSIS — Z794 Long term (current) use of insulin: Secondary | ICD-10-CM | POA: Diagnosis not present

## 2021-11-23 DIAGNOSIS — E1122 Type 2 diabetes mellitus with diabetic chronic kidney disease: Secondary | ICD-10-CM | POA: Diagnosis not present

## 2021-11-24 DIAGNOSIS — Z4781 Encounter for orthopedic aftercare following surgical amputation: Secondary | ICD-10-CM | POA: Diagnosis not present

## 2021-11-24 DIAGNOSIS — E1122 Type 2 diabetes mellitus with diabetic chronic kidney disease: Secondary | ICD-10-CM | POA: Diagnosis not present

## 2021-11-24 DIAGNOSIS — E1151 Type 2 diabetes mellitus with diabetic peripheral angiopathy without gangrene: Secondary | ICD-10-CM | POA: Diagnosis not present

## 2021-11-24 DIAGNOSIS — Z89612 Acquired absence of left leg above knee: Secondary | ICD-10-CM | POA: Diagnosis not present

## 2021-11-24 DIAGNOSIS — Z89511 Acquired absence of right leg below knee: Secondary | ICD-10-CM | POA: Diagnosis not present

## 2021-11-24 DIAGNOSIS — I129 Hypertensive chronic kidney disease with stage 1 through stage 4 chronic kidney disease, or unspecified chronic kidney disease: Secondary | ICD-10-CM | POA: Diagnosis not present

## 2021-11-26 DIAGNOSIS — I129 Hypertensive chronic kidney disease with stage 1 through stage 4 chronic kidney disease, or unspecified chronic kidney disease: Secondary | ICD-10-CM | POA: Diagnosis not present

## 2021-11-26 DIAGNOSIS — E1122 Type 2 diabetes mellitus with diabetic chronic kidney disease: Secondary | ICD-10-CM | POA: Diagnosis not present

## 2021-11-26 DIAGNOSIS — Z4781 Encounter for orthopedic aftercare following surgical amputation: Secondary | ICD-10-CM | POA: Diagnosis not present

## 2021-11-26 DIAGNOSIS — Z89612 Acquired absence of left leg above knee: Secondary | ICD-10-CM | POA: Diagnosis not present

## 2021-11-26 DIAGNOSIS — Z89511 Acquired absence of right leg below knee: Secondary | ICD-10-CM | POA: Diagnosis not present

## 2021-11-26 DIAGNOSIS — E1151 Type 2 diabetes mellitus with diabetic peripheral angiopathy without gangrene: Secondary | ICD-10-CM | POA: Diagnosis not present

## 2021-11-27 DIAGNOSIS — I129 Hypertensive chronic kidney disease with stage 1 through stage 4 chronic kidney disease, or unspecified chronic kidney disease: Secondary | ICD-10-CM | POA: Diagnosis not present

## 2021-11-27 DIAGNOSIS — E1122 Type 2 diabetes mellitus with diabetic chronic kidney disease: Secondary | ICD-10-CM | POA: Diagnosis not present

## 2021-11-27 DIAGNOSIS — E1151 Type 2 diabetes mellitus with diabetic peripheral angiopathy without gangrene: Secondary | ICD-10-CM | POA: Diagnosis not present

## 2021-11-27 DIAGNOSIS — Z89612 Acquired absence of left leg above knee: Secondary | ICD-10-CM | POA: Diagnosis not present

## 2021-11-27 DIAGNOSIS — Z89511 Acquired absence of right leg below knee: Secondary | ICD-10-CM | POA: Diagnosis not present

## 2021-11-27 DIAGNOSIS — Z4781 Encounter for orthopedic aftercare following surgical amputation: Secondary | ICD-10-CM | POA: Diagnosis not present

## 2021-11-28 DIAGNOSIS — E1122 Type 2 diabetes mellitus with diabetic chronic kidney disease: Secondary | ICD-10-CM | POA: Diagnosis not present

## 2021-11-28 DIAGNOSIS — E1151 Type 2 diabetes mellitus with diabetic peripheral angiopathy without gangrene: Secondary | ICD-10-CM | POA: Diagnosis not present

## 2021-11-28 DIAGNOSIS — Z89511 Acquired absence of right leg below knee: Secondary | ICD-10-CM | POA: Diagnosis not present

## 2021-11-28 DIAGNOSIS — Z4781 Encounter for orthopedic aftercare following surgical amputation: Secondary | ICD-10-CM | POA: Diagnosis not present

## 2021-11-28 DIAGNOSIS — I129 Hypertensive chronic kidney disease with stage 1 through stage 4 chronic kidney disease, or unspecified chronic kidney disease: Secondary | ICD-10-CM | POA: Diagnosis not present

## 2021-11-28 DIAGNOSIS — Z89612 Acquired absence of left leg above knee: Secondary | ICD-10-CM | POA: Diagnosis not present

## 2021-12-01 DIAGNOSIS — Z4781 Encounter for orthopedic aftercare following surgical amputation: Secondary | ICD-10-CM | POA: Diagnosis not present

## 2021-12-01 DIAGNOSIS — I129 Hypertensive chronic kidney disease with stage 1 through stage 4 chronic kidney disease, or unspecified chronic kidney disease: Secondary | ICD-10-CM | POA: Diagnosis not present

## 2021-12-01 DIAGNOSIS — Z89612 Acquired absence of left leg above knee: Secondary | ICD-10-CM | POA: Diagnosis not present

## 2021-12-01 DIAGNOSIS — Z89511 Acquired absence of right leg below knee: Secondary | ICD-10-CM | POA: Diagnosis not present

## 2021-12-01 DIAGNOSIS — E1151 Type 2 diabetes mellitus with diabetic peripheral angiopathy without gangrene: Secondary | ICD-10-CM | POA: Diagnosis not present

## 2021-12-01 DIAGNOSIS — E1122 Type 2 diabetes mellitus with diabetic chronic kidney disease: Secondary | ICD-10-CM | POA: Diagnosis not present

## 2021-12-02 DIAGNOSIS — Z89612 Acquired absence of left leg above knee: Secondary | ICD-10-CM | POA: Diagnosis not present

## 2021-12-02 DIAGNOSIS — Z89511 Acquired absence of right leg below knee: Secondary | ICD-10-CM | POA: Diagnosis not present

## 2021-12-02 DIAGNOSIS — E1151 Type 2 diabetes mellitus with diabetic peripheral angiopathy without gangrene: Secondary | ICD-10-CM | POA: Diagnosis not present

## 2021-12-02 DIAGNOSIS — E1122 Type 2 diabetes mellitus with diabetic chronic kidney disease: Secondary | ICD-10-CM | POA: Diagnosis not present

## 2021-12-02 DIAGNOSIS — I129 Hypertensive chronic kidney disease with stage 1 through stage 4 chronic kidney disease, or unspecified chronic kidney disease: Secondary | ICD-10-CM | POA: Diagnosis not present

## 2021-12-02 DIAGNOSIS — Z4781 Encounter for orthopedic aftercare following surgical amputation: Secondary | ICD-10-CM | POA: Diagnosis not present

## 2021-12-04 DIAGNOSIS — E1151 Type 2 diabetes mellitus with diabetic peripheral angiopathy without gangrene: Secondary | ICD-10-CM | POA: Diagnosis not present

## 2021-12-04 DIAGNOSIS — E1122 Type 2 diabetes mellitus with diabetic chronic kidney disease: Secondary | ICD-10-CM | POA: Diagnosis not present

## 2021-12-04 DIAGNOSIS — Z4781 Encounter for orthopedic aftercare following surgical amputation: Secondary | ICD-10-CM | POA: Diagnosis not present

## 2021-12-04 DIAGNOSIS — Z89612 Acquired absence of left leg above knee: Secondary | ICD-10-CM | POA: Diagnosis not present

## 2021-12-04 DIAGNOSIS — Z89511 Acquired absence of right leg below knee: Secondary | ICD-10-CM | POA: Diagnosis not present

## 2021-12-04 DIAGNOSIS — I129 Hypertensive chronic kidney disease with stage 1 through stage 4 chronic kidney disease, or unspecified chronic kidney disease: Secondary | ICD-10-CM | POA: Diagnosis not present

## 2021-12-08 DIAGNOSIS — Z89612 Acquired absence of left leg above knee: Secondary | ICD-10-CM | POA: Diagnosis not present

## 2021-12-08 DIAGNOSIS — Z89511 Acquired absence of right leg below knee: Secondary | ICD-10-CM | POA: Diagnosis not present

## 2021-12-08 DIAGNOSIS — E1151 Type 2 diabetes mellitus with diabetic peripheral angiopathy without gangrene: Secondary | ICD-10-CM | POA: Diagnosis not present

## 2021-12-08 DIAGNOSIS — I129 Hypertensive chronic kidney disease with stage 1 through stage 4 chronic kidney disease, or unspecified chronic kidney disease: Secondary | ICD-10-CM | POA: Diagnosis not present

## 2021-12-08 DIAGNOSIS — E1122 Type 2 diabetes mellitus with diabetic chronic kidney disease: Secondary | ICD-10-CM | POA: Diagnosis not present

## 2021-12-08 DIAGNOSIS — Z4781 Encounter for orthopedic aftercare following surgical amputation: Secondary | ICD-10-CM | POA: Diagnosis not present

## 2021-12-09 DIAGNOSIS — Z89612 Acquired absence of left leg above knee: Secondary | ICD-10-CM | POA: Diagnosis not present

## 2021-12-09 DIAGNOSIS — E1151 Type 2 diabetes mellitus with diabetic peripheral angiopathy without gangrene: Secondary | ICD-10-CM | POA: Diagnosis not present

## 2021-12-09 DIAGNOSIS — E1122 Type 2 diabetes mellitus with diabetic chronic kidney disease: Secondary | ICD-10-CM | POA: Diagnosis not present

## 2021-12-09 DIAGNOSIS — I129 Hypertensive chronic kidney disease with stage 1 through stage 4 chronic kidney disease, or unspecified chronic kidney disease: Secondary | ICD-10-CM | POA: Diagnosis not present

## 2021-12-09 DIAGNOSIS — Z89511 Acquired absence of right leg below knee: Secondary | ICD-10-CM | POA: Diagnosis not present

## 2021-12-09 DIAGNOSIS — Z4781 Encounter for orthopedic aftercare following surgical amputation: Secondary | ICD-10-CM | POA: Diagnosis not present

## 2021-12-10 DIAGNOSIS — E871 Hypo-osmolality and hyponatremia: Secondary | ICD-10-CM | POA: Diagnosis not present

## 2021-12-10 DIAGNOSIS — N183 Chronic kidney disease, stage 3 unspecified: Secondary | ICD-10-CM | POA: Diagnosis not present

## 2021-12-10 DIAGNOSIS — Z89511 Acquired absence of right leg below knee: Secondary | ICD-10-CM | POA: Diagnosis not present

## 2021-12-10 DIAGNOSIS — E1151 Type 2 diabetes mellitus with diabetic peripheral angiopathy without gangrene: Secondary | ICD-10-CM | POA: Diagnosis not present

## 2021-12-10 DIAGNOSIS — I739 Peripheral vascular disease, unspecified: Secondary | ICD-10-CM | POA: Diagnosis not present

## 2021-12-10 DIAGNOSIS — I129 Hypertensive chronic kidney disease with stage 1 through stage 4 chronic kidney disease, or unspecified chronic kidney disease: Secondary | ICD-10-CM | POA: Diagnosis not present

## 2021-12-10 DIAGNOSIS — F329 Major depressive disorder, single episode, unspecified: Secondary | ICD-10-CM | POA: Diagnosis not present

## 2021-12-10 DIAGNOSIS — I10 Essential (primary) hypertension: Secondary | ICD-10-CM | POA: Diagnosis not present

## 2021-12-10 DIAGNOSIS — E1165 Type 2 diabetes mellitus with hyperglycemia: Secondary | ICD-10-CM | POA: Diagnosis not present

## 2021-12-10 DIAGNOSIS — R5381 Other malaise: Secondary | ICD-10-CM | POA: Diagnosis not present

## 2021-12-10 DIAGNOSIS — Z4781 Encounter for orthopedic aftercare following surgical amputation: Secondary | ICD-10-CM | POA: Diagnosis not present

## 2021-12-10 DIAGNOSIS — R002 Palpitations: Secondary | ICD-10-CM | POA: Diagnosis not present

## 2021-12-10 DIAGNOSIS — D529 Folate deficiency anemia, unspecified: Secondary | ICD-10-CM | POA: Diagnosis not present

## 2021-12-10 DIAGNOSIS — Z720 Tobacco use: Secondary | ICD-10-CM | POA: Diagnosis not present

## 2021-12-10 DIAGNOSIS — E1122 Type 2 diabetes mellitus with diabetic chronic kidney disease: Secondary | ICD-10-CM | POA: Diagnosis not present

## 2021-12-10 DIAGNOSIS — D649 Anemia, unspecified: Secondary | ICD-10-CM | POA: Diagnosis not present

## 2021-12-10 DIAGNOSIS — E1140 Type 2 diabetes mellitus with diabetic neuropathy, unspecified: Secondary | ICD-10-CM | POA: Diagnosis not present

## 2021-12-10 DIAGNOSIS — D519 Vitamin B12 deficiency anemia, unspecified: Secondary | ICD-10-CM | POA: Diagnosis not present

## 2021-12-10 DIAGNOSIS — Z89612 Acquired absence of left leg above knee: Secondary | ICD-10-CM | POA: Diagnosis not present

## 2021-12-12 DIAGNOSIS — E1151 Type 2 diabetes mellitus with diabetic peripheral angiopathy without gangrene: Secondary | ICD-10-CM | POA: Diagnosis not present

## 2021-12-12 DIAGNOSIS — E1122 Type 2 diabetes mellitus with diabetic chronic kidney disease: Secondary | ICD-10-CM | POA: Diagnosis not present

## 2021-12-12 DIAGNOSIS — I129 Hypertensive chronic kidney disease with stage 1 through stage 4 chronic kidney disease, or unspecified chronic kidney disease: Secondary | ICD-10-CM | POA: Diagnosis not present

## 2021-12-12 DIAGNOSIS — Z89612 Acquired absence of left leg above knee: Secondary | ICD-10-CM | POA: Diagnosis not present

## 2021-12-12 DIAGNOSIS — Z89511 Acquired absence of right leg below knee: Secondary | ICD-10-CM | POA: Diagnosis not present

## 2021-12-12 DIAGNOSIS — Z4781 Encounter for orthopedic aftercare following surgical amputation: Secondary | ICD-10-CM | POA: Diagnosis not present

## 2021-12-17 DIAGNOSIS — I129 Hypertensive chronic kidney disease with stage 1 through stage 4 chronic kidney disease, or unspecified chronic kidney disease: Secondary | ICD-10-CM | POA: Diagnosis not present

## 2021-12-17 DIAGNOSIS — Z4781 Encounter for orthopedic aftercare following surgical amputation: Secondary | ICD-10-CM | POA: Diagnosis not present

## 2021-12-17 DIAGNOSIS — Z89511 Acquired absence of right leg below knee: Secondary | ICD-10-CM | POA: Diagnosis not present

## 2021-12-17 DIAGNOSIS — Z89612 Acquired absence of left leg above knee: Secondary | ICD-10-CM | POA: Diagnosis not present

## 2021-12-17 DIAGNOSIS — E1122 Type 2 diabetes mellitus with diabetic chronic kidney disease: Secondary | ICD-10-CM | POA: Diagnosis not present

## 2021-12-17 DIAGNOSIS — E1151 Type 2 diabetes mellitus with diabetic peripheral angiopathy without gangrene: Secondary | ICD-10-CM | POA: Diagnosis not present

## 2021-12-18 ENCOUNTER — Telehealth: Payer: Self-pay | Admitting: Cardiology

## 2021-12-18 NOTE — Telephone Encounter (Signed)
NP states that she has been experiencing low HR's the last couple of days and would like to know if she could be seen sooner than scheduled appt. Please advise

## 2021-12-18 NOTE — Telephone Encounter (Signed)
Advised that she needed to contact her PCP with symptoms since she's never been seen by our office. Advised taht she is already on wait list. Verbalized understanding.

## 2022-01-13 DIAGNOSIS — E1140 Type 2 diabetes mellitus with diabetic neuropathy, unspecified: Secondary | ICD-10-CM | POA: Diagnosis not present

## 2022-01-13 DIAGNOSIS — I739 Peripheral vascular disease, unspecified: Secondary | ICD-10-CM | POA: Diagnosis not present

## 2022-01-13 DIAGNOSIS — R5381 Other malaise: Secondary | ICD-10-CM | POA: Diagnosis not present

## 2022-01-13 DIAGNOSIS — E1165 Type 2 diabetes mellitus with hyperglycemia: Secondary | ICD-10-CM | POA: Diagnosis not present

## 2022-01-13 DIAGNOSIS — E538 Deficiency of other specified B group vitamins: Secondary | ICD-10-CM | POA: Diagnosis not present

## 2022-01-13 DIAGNOSIS — E871 Hypo-osmolality and hyponatremia: Secondary | ICD-10-CM | POA: Diagnosis not present

## 2022-01-13 DIAGNOSIS — E559 Vitamin D deficiency, unspecified: Secondary | ICD-10-CM | POA: Diagnosis not present

## 2022-01-13 DIAGNOSIS — R519 Headache, unspecified: Secondary | ICD-10-CM | POA: Diagnosis not present

## 2022-01-13 DIAGNOSIS — N183 Chronic kidney disease, stage 3 unspecified: Secondary | ICD-10-CM | POA: Diagnosis not present

## 2022-01-13 DIAGNOSIS — E785 Hyperlipidemia, unspecified: Secondary | ICD-10-CM | POA: Diagnosis not present

## 2022-01-13 DIAGNOSIS — R002 Palpitations: Secondary | ICD-10-CM | POA: Diagnosis not present

## 2022-01-13 DIAGNOSIS — I10 Essential (primary) hypertension: Secondary | ICD-10-CM | POA: Diagnosis not present

## 2022-01-26 ENCOUNTER — Encounter: Payer: Self-pay | Admitting: Cardiology

## 2022-01-26 NOTE — Progress Notes (Signed)
Cardiology Office Note  Date: 01/29/2022   ID: [name], DOB 23-May-1971, MRN [id number]  PCP:  Selinda Flavin, MD  Cardiologist:  Nona Dell, MD Electrophysiologist:  None   Chief Complaint  Patient presents with   Palpitations    History of Present Illness: [name] is a 51 y.o. female referred for cardiology consultation by Ms. Skillman PA-C with [facility] for the evaluation of palpitations and documented PVCs.  We discussed her symptoms today.  She feels a sense of intermittently rapid and sometimes irregular heartbeat.  This occurs at rest. She has severe PAD and is status post bilateral AKA at this point, in a wheelchair and with a home assistant.  She has had no syncope.  She actually became concerned when her heart rate was in the "30s" by pulse oximeter, she was wondering whether her pain medications were contributing.  With PVCs documented, I question whether this was actually pseudo bradycardia in the setting of under counted PVCs.  I personally reviewed her ECG today which shows sinus rhythm with frequent PACs and PVCs.  Her last echocardiogram was in 2013 at which point LVEF was 65 to 70%.  I reviewed her medications, she is on low-dose Coreg.  Systolic is 100 today.  Past Medical History:  Diagnosis Date   Anemia 02/05/2012   Anxiety    Arthritis    Asthma    CKD (chronic kidney disease) stage 3, GFR 30-59 ml/min (HCC) 09/25/2021   COPD (chronic obstructive pulmonary disease) (HCC)    Depression    Essential hypertension    Fatty liver    GERD (gastroesophageal reflux disease)    Headache(784.0)    PAD (peripheral artery disease) (HCC)    Status post bilateral AKA   PE (pulmonary embolism)    Xarelto   Pneumonia 2013   Sepsis (HCC)    Type 2 diabetes mellitus (HCC)     Past Surgical History:  Procedure Laterality Date   ABDOMINAL AORTOGRAM W/LOWER EXTREMITY N/A 09/05/2021   Procedure: ABDOMINAL AORTOGRAM W/LOWER EXTREMITY;  Surgeon:  [name], MD;  Location: [facility];  Service: Cardiovascular;  Laterality: N/A;   AMPUTATION Right 09/24/2021   Procedure: RIGHT ABOVE KNEE AMPUTATION;  Surgeon: [name], MD;  Location: [facility] OR;  Service: Vascular;  Laterality: Right;   APPENDECTOMY     CHOLECYSTECTOMY     DIAGNOSTIC LAPAROSCOPY     ESOPHAGEAL DILATION N/A 10/09/2014   Procedure: ESOPHAGEAL DILATION;  Surgeon: West Bali, MD;  Location: [facility] ORS;  Service: Endoscopy;  Laterality: N/A;  #15 and #16 savory   ESOPHAGOGASTRODUODENOSCOPY (EGD) WITH PROPOFOL N/A 10/09/2014   Dr. Darrick Penna: mild erosive gastritis, Savary dilation. Nausea and vomiting most likely due to uncontroleld blood sugars, reflux, and gastritis.     Current Outpatient Medications  Medication Sig Dispense Refill   ALPRAZolam (XANAX) 0.5 MG tablet Take 1 tablet (0.5 mg total) by mouth 3 (three) times daily as needed for anxiety. 10 tablet 0   aspirin EC 81 MG tablet Take 81 mg by mouth daily. Swallow whole.     atorvastatin (LIPITOR) 40 MG tablet Take 40 mg by mouth every evening.     carvedilol (COREG) 3.125 MG tablet Take 3.125 mg by mouth 2 (two) times daily with a meal.     Cholecalciferol (VITAMIN D3 PO) Take 1 tablet by mouth daily.     clopidogrel (PLAVIX) 75 MG tablet Take 75 mg by mouth daily.  docusate sodium (COLACE) 100 MG capsule Take 100 mg by mouth as needed.     escitalopram (LEXAPRO) 10 MG tablet Take 10 mg by mouth daily.     gabapentin (NEURONTIN) 400 MG capsule Take 400 mg by mouth 4 (four) times daily.     Insulin Glargine (BASAGLAR KWIKPEN) 100 UNIT/ML Inject 40 Units into the skin 2 (two) times daily.     JARDIANCE 10 MG TABS tablet Take 10 mg by mouth daily.     linaclotide (LINZESS) 72 MCG capsule Take 72 mcg by mouth as needed.     metFORMIN (GLUCOPHAGE) 1000 MG tablet Take 1,000 mg by mouth 2 (two) times daily.     methocarbamol (ROBAXIN) 500 MG tablet Take 500 mg by mouth 2 (two) times daily.     ondansetron  (ZOFRAN) 4 MG tablet Take 4 mg by mouth every 6 (six) hours as needed for nausea or vomiting.     oxyCODONE-acetaminophen (PERCOCET) 7.5-325 MG tablet Take 1 tablet by mouth as needed for severe pain.     oxyCODONE-acetaminophen (PERCOCET/ROXICET) 5-325 MG tablet Take 1 tablet by mouth every 4 (four) hours as needed for severe pain. 20 tablet 0   pantoprazole (PROTONIX) 40 MG tablet Take 1 tablet (40 mg total) by mouth daily.     topiramate (TOPAMAX) 25 MG tablet Take 25 mg by mouth 2 (two) times daily.     vitamin B-12 1000 MCG tablet Take 1 tablet (1,000 mcg total) by mouth daily.     ferrous sulfate 325 (65 FE) MG tablet Take 1 tablet (325 mg total) by mouth daily with breakfast. (Patient not taking: Reported on 01/29/2022)  3   folic acid (FOLVITE) 1 MG tablet Take 1 tablet (1 mg total) by mouth daily. (Patient not taking: Reported on 01/29/2022)     No current facility-administered medications for this visit.   Allergies:  Patient has no active allergies.   Social History: The patient  reports that she has been smoking cigarettes. She has a 20.00 pack-year smoking history. She has never used smokeless tobacco. She reports that she does not drink alcohol and does not use drugs.   Family History: The patient's family history includes Breast cancer in her mother and sister.   ROS: No orthopnea or PND.  Physical Exam: VS:  BP (!) 100/52   Pulse (!) 102   SpO2 97% , BMI There is no height or weight on file to calculate BMI.  Wt Readings from Last 3 Encounters:  10/15/21 191 lb (86.6 kg)  09/24/21 190 lb (86.2 kg)  09/05/21 185 lb (83.9 kg)    General: Patient in wheelchair today. HEENT: Conjunctiva and lids normal, oropharynx clear. Neck: Supple, no elevated JVP or carotid bruits, no thyromegaly. Lungs: Clear to auscultation, nonlabored breathing at rest. Cardiac: Irregular with frequent ectopy, no S3 or significant systolic murmur, no pericardial rub. Abdomen: Soft, nontender, bowel  sounds present. Extremities: Status post bilateral AKA. Skin: Warm and dry. Musculoskeletal: No kyphosis. Neuropsychiatric: Alert and oriented x3, affect grossly appropriate.  ECG:  An ECG dated 09/25/2021 was personally reviewed today and demonstrated:  Sinus rhythm with frequent PVCs, low voltage.  Recent Labwork: January 2023: Cholesterol 243, triglycerides 389, HDL 26, LDL 145, TSH 2.72, hemoglobin A1c 12.1% 09/24/2021: ALT 9; AST 10 09/27/2021: Magnesium 2.4 10/02/2021: BUN 26; Creatinine, Ser 1.50; Hemoglobin 7.4; Platelets 318; Potassium 5.0; Sodium 126   Other Studies Reviewed Today:  Echocardiogram 01/29/2012: Left ventricle: The cavity size was normal. Wall thickness  was normal. Systolic function was vigorous. The estimated  ejection fraction was in the range of 65% to 70%. Left  ventricular diastolic function parameters were normal.   Assessment and Plan:  1.  Palpitations, recently documented frequent PVCs and also PACs.  Suspect that her low heart rates by pulse oximeter were "pseudo bradycardia" in the setting of frequent ectopy.  She is on Coreg 3.125 mg twice daily presumably for treatment of hypertension, has been on this for quite some time.  Plan is to obtain a 72-hour Zio patch to better quantify ectopy and establish rhythm.  May consider switching to Toprol-XL subsequently.  We will also update her echocardiogram to ensure normal LVEF.  2.  Severe PAD status post bilateral AKA.  Unfortunately, tobacco use continues.  She is on aspirin, Plavix, and Lipitor.  3.  Mixed hyperlipidemia with LDL 145 in January, now on Lipitor 40 mg daily.  Continue to track with time and assess whether she gets closer to goal otherwise further medical therapy adjustments will be needed.  Medication Adjustments/Labs and Tests Ordered: Current medicines are reviewed at length with the patient today.  Concerns regarding medicines are outlined above.   Tests Ordered: Orders Placed This Encounter   Procedures   EKG 12-Lead   ECHOCARDIOGRAM COMPLETE    Medication Changes: No orders of the defined types were placed in this encounter.   Disposition:  Follow up  test results.  Signed, Jonelle Sidle, MD, [facility] 01/29/2022 9:46 AM    [facility] at [facility] [address] [place], [place], [name] [address] Phone: [phone]; Fax: [phone]

## 2022-01-28 ENCOUNTER — Telehealth: Payer: Self-pay | Admitting: Cardiology

## 2022-01-28 NOTE — Telephone Encounter (Signed)
Pt called stating she needs assistance in and out of the car in a wheelchair because her sister wont be there with her. She states that she was concerned that she may her appt due to the long wait on the phone.  She states that she will be outside at 8:45am for her appt tomorrow in a red nissan. Please advise.

## 2022-01-28 NOTE — Telephone Encounter (Signed)
Returned call, unable to leave message due to voicemail not being set up.

## 2022-01-29 ENCOUNTER — Encounter: Payer: Self-pay | Admitting: Cardiology

## 2022-01-29 ENCOUNTER — Ambulatory Visit (INDEPENDENT_AMBULATORY_CARE_PROVIDER_SITE_OTHER): Payer: Medicare Other

## 2022-01-29 ENCOUNTER — Telehealth: Payer: Self-pay | Admitting: Cardiology

## 2022-01-29 ENCOUNTER — Ambulatory Visit (INDEPENDENT_AMBULATORY_CARE_PROVIDER_SITE_OTHER): Payer: Medicare Other | Admitting: Cardiology

## 2022-01-29 ENCOUNTER — Other Ambulatory Visit: Payer: Self-pay | Admitting: Cardiology

## 2022-01-29 VITALS — BP 100/52 | HR 102

## 2022-01-29 DIAGNOSIS — I739 Peripheral vascular disease, unspecified: Secondary | ICD-10-CM | POA: Diagnosis not present

## 2022-01-29 DIAGNOSIS — R0602 Shortness of breath: Secondary | ICD-10-CM

## 2022-01-29 DIAGNOSIS — I493 Ventricular premature depolarization: Secondary | ICD-10-CM

## 2022-01-29 NOTE — Telephone Encounter (Signed)
Pre Cert ZIO Heart Monitor

## 2022-01-29 NOTE — Patient Instructions (Signed)
Medication Instructions:  Your physician recommends that you continue on your current medications as directed. Please refer to the Current Medication list given to you today.   Labwork: None  Testing/Procedures: Your physician has requested that you have an echocardiogram. Echocardiography is a painless test that uses sound waves to create images of your heart. It provides your doctor with information about the size and shape of your heart and how well your heart's chambers and valves are working. This procedure takes approximately one hour. There are no restrictions for this procedure.   ZIO- Long Term Monitor Instructions   Your physician has requested you wear your ZIO patch monitor 3 days.   This is a single patch monitor.  [employer] supplies one patch monitor per enrollment.  Additional stickers are not available.   Please do not apply patch if you will be having a Nuclear Stress Test, Echocardiogram, Cardiac CT, MRI, or Chest Xray during the time frame you would be wearing the monitor. The patch cannot be worn during these tests.  You cannot remove and re-apply the ZIO XT patch monitor.   Your ZIO patch monitor will be sent USPS Priority mail from [facility] directly to your home address. The monitor may also be mailed to a PO BOX if home delivery is not available.   It may take 3-5 days to receive your monitor after you have been enrolled.   Once you have received you monitor, please review enclosed instructions.  Your monitor has already been registered assigning a specific monitor serial # to you.     Do not shower for the first 24 hours.  You may shower after the first 24 hours.   Press button if you feel a symptom. You will hear a small click.  Record Date, Time and Symptom in the Patient Log Book.   When you are ready to remove patch, follow instructions on last 2 pages of Patient Log Book.  Stick patch monitor onto last page of Patient Log Book.   Place Patient Log  Book in [place] box.  Use locking tab on box and tape box closed securely.  The [employer] has [employer] on it.  Please place in mailbox as soon as possible.  Your physician should have your test results approximately 7 days after the monitor has been mailed back to [facility].   Call [facility] Customer Care at [phone] if you have questions regarding your ZIO XT patch monitor.  Call them immediately if you see an orange light blinking on your monitor.   If your monitor falls off in less than 4 days contact our Monitor department at [phone].  If your monitor becomes loose or falls off after 4 days call [employer] at [phone] for suggestions on securing your monitor.   Follow-Up:  Your physician recommends that you schedule a follow-up appointment in: Pending  Any Other Special Instructions Will Be Listed Below (If Applicable).  If you need a refill on your cardiac medications before your next appointment, please call your pharmacy.

## 2022-02-03 ENCOUNTER — Ambulatory Visit (INDEPENDENT_AMBULATORY_CARE_PROVIDER_SITE_OTHER): Payer: Medicare Other

## 2022-02-03 DIAGNOSIS — R0602 Shortness of breath: Secondary | ICD-10-CM

## 2022-02-03 LAB — ECHOCARDIOGRAM COMPLETE
AR max vel: 1.17 cm2
AV Peak grad: 12.4 mmHg
AV Vena cont: 0.26 cm
Ao pk vel: 1.76 m/s
Area-P 1/2: 4.74 cm2
Calc EF: 43.2 %
MV M vel: 4.69 m/s
MV Peak grad: 88.1 mmHg
MV Vena cont: 0.15 cm
P 1/2 time: 432 msec
Radius: 0.37 cm
S' Lateral: 4.18 cm
Single Plane A2C EF: 37.9 %
Single Plane A4C EF: 47.8 %

## 2022-02-13 DIAGNOSIS — I493 Ventricular premature depolarization: Secondary | ICD-10-CM | POA: Diagnosis not present

## 2022-02-17 ENCOUNTER — Telehealth: Payer: Self-pay | Admitting: Cardiology

## 2022-02-17 MED ORDER — METOPROLOL SUCCINATE ER 25 MG PO TB24: 25.0000 mg | ORAL_TABLET | Freq: Every day | ORAL | 2 refills | Status: DC

## 2022-02-17 NOTE — Telephone Encounter (Signed)
Patient is calling for the results to her monitor.

## 2022-02-17 NOTE — Telephone Encounter (Signed)
-----   Message from Jonelle Sidle, MD sent at 02/16/2022  9:20 AM EDT ----- Results reviewed.  Frequent PVCs and brief bursts of NSVT noted.  Echo reviewed as well.  Suggest switching from Coreg to Toprol-XL 25 mg daily, continue Jardiance.  Schedule office follow-up in the next 6 weeks.

## 2022-02-17 NOTE — Telephone Encounter (Signed)
Patient informed and verbalized understanding of plan. Copy sent to PCP 

## 2022-02-23 DIAGNOSIS — Z1231 Encounter for screening mammogram for malignant neoplasm of breast: Secondary | ICD-10-CM | POA: Diagnosis not present

## 2022-02-23 DIAGNOSIS — R002 Palpitations: Secondary | ICD-10-CM | POA: Diagnosis not present

## 2022-02-23 DIAGNOSIS — I10 Essential (primary) hypertension: Secondary | ICD-10-CM | POA: Diagnosis not present

## 2022-02-23 DIAGNOSIS — Z720 Tobacco use: Secondary | ICD-10-CM | POA: Diagnosis not present

## 2022-02-23 DIAGNOSIS — E785 Hyperlipidemia, unspecified: Secondary | ICD-10-CM | POA: Diagnosis not present

## 2022-02-23 DIAGNOSIS — E559 Vitamin D deficiency, unspecified: Secondary | ICD-10-CM | POA: Diagnosis not present

## 2022-02-23 DIAGNOSIS — I739 Peripheral vascular disease, unspecified: Secondary | ICD-10-CM | POA: Diagnosis not present

## 2022-02-23 DIAGNOSIS — R5381 Other malaise: Secondary | ICD-10-CM | POA: Diagnosis not present

## 2022-02-23 DIAGNOSIS — R7989 Other specified abnormal findings of blood chemistry: Secondary | ICD-10-CM | POA: Diagnosis not present

## 2022-02-23 DIAGNOSIS — E1165 Type 2 diabetes mellitus with hyperglycemia: Secondary | ICD-10-CM | POA: Diagnosis not present

## 2022-02-23 DIAGNOSIS — N183 Chronic kidney disease, stage 3 unspecified: Secondary | ICD-10-CM | POA: Diagnosis not present

## 2022-02-23 DIAGNOSIS — D649 Anemia, unspecified: Secondary | ICD-10-CM | POA: Diagnosis not present

## 2022-02-23 DIAGNOSIS — E538 Deficiency of other specified B group vitamins: Secondary | ICD-10-CM | POA: Diagnosis not present

## 2022-03-13 ENCOUNTER — Encounter: Payer: Self-pay | Admitting: *Deleted

## 2022-03-13 ENCOUNTER — Other Ambulatory Visit: Payer: Self-pay | Admitting: *Deleted

## 2022-03-13 DIAGNOSIS — E1169 Type 2 diabetes mellitus with other specified complication: Secondary | ICD-10-CM

## 2022-03-13 NOTE — Patient Outreach (Signed)
  Care Coordination   Initial Visit Note   03/13/2022 Name: [name] MRN: [id number] DOB: Nov 28, 1970  [name] is a 51 y.o. year old female who sees Sharon Flavin, MD for primary care. I spoke with  [name] by phone today  What matters to the patients health and wellness today?  Had right AKA in March, now double amputee.  Lives alone, has aide daily for 6 hours.  Uses wheelchair, but having trouble with space in home, feels motorized chair would work better for her.  She has had depression due to the loss of both legs, agrees to counseling with CSW.    A1C has been decreasing, remains at 8.1, started on Jardiance last month.  Will have follow up with PCP next month to see if this is working.  Unsure when her last AWV was, will discuss with provider.     Goals Addressed               This Visit's Progress     Management of DM, decrease A1C (pt-stated)        Care Coordination Interventions: Provided education to patient about basic DM disease process Reviewed medications with patient and discussed importance of medication adherence Reviewed scheduled/upcoming provider appointments including: cardiology follow up 9/18 Referral made to social work team for assistance with counseling for depression and DME (motorized chair) Referral made to community resources care guide team for assistance with transportation resources Assessed social determinant of health barriers         SDOH assessments and interventions completed:  Yes  SDOH Interventions Today    Flowsheet Row Most Recent Value  SDOH Interventions   Food Insecurity Interventions Intervention Not Indicated  Financial Strain Interventions Intervention Not Indicated  Housing Interventions Intervention Not Indicated        Care Coordination Interventions Activated:  Yes  Care Coordination Interventions:  Yes, provided   Follow up plan: Referral made to CSW for counseling and to care guide for  transportation resources Follow up call scheduled for 9/12    Encounter Outcome:  Pt. Visit Completed   Sharon Durie, RN, MSN, [facility] Care Coordinator [phone]

## 2022-03-18 ENCOUNTER — Telehealth: Payer: Self-pay | Admitting: *Deleted

## 2022-03-18 ENCOUNTER — Encounter: Payer: Self-pay | Admitting: *Deleted

## 2022-03-18 ENCOUNTER — Ambulatory Visit: Payer: Self-pay | Admitting: *Deleted

## 2022-03-18 NOTE — Patient Instructions (Signed)
Visit Information  Thank you for taking time to visit with me today. Please don't hesitate to contact me if I can be of assistance to you.   Following are the goals we discussed today:   Goals Addressed               This Visit's Progress     Reduce and Manage My Symptom of Anxiety and Depression. (pt-stated)   On track     Care Coordination Interventions:  PHQ2/PHQ9 Depression Screen Completed & Results Reviewed.   Suicidal Ideation/Homicidal Ideation Assessed - None Present. Solution-Focused Strategies Employed. Deep Breathing Exercises, Relaxation Techniques & Mindfulness Meditation Strategies Taught & Encouraged Daily.   Active Listening/Reflection Utilized.  Emotional Support Provided. Verbalization of Feelings Encouraged.  Provided Psychoeducation for Mental Health Concerns. Reviewed Mental Health Medications & Discussed Importance of Compliance. Quality of Sleep Assessed & Sleep Hygiene Techniques Promoted. Participation in Counseling Emphasized. Participation in Support Group Reviewed. Discussed Referral to Psychiatrist for Psychotropic Medication Management. Discussed Referral to Therapist for Psychotherapeutic Counseling Services.         Our next appointment is by telephone on 04/08/2022 at 9:45 am.  Please call the care guide team at [phone] if you need to cancel or reschedule your appointment.   If you are experiencing a Mental Health or Behavioral Health Crisis or need someone to talk to, please call the Suicide and Crisis Lifeline: 988 call the [place] National Suicide Prevention Lifeline: (743) 853-4800 or TTY: (310)282-5492 TTY 432-595-8461) to talk to a trained counselor call 1-800-273-TALK (toll free, 24 hour hotline) go to [facility] [address], [place] [phone]) call the [facility] Crisis Line: [phone] call 911  Patient verbalizes understanding of instructions and care plan provided  today and agrees to view in MyChart. Active MyChart status and patient understanding of how to access instructions and care plan via MyChart confirmed with patient.     Telephone follow up appointment with care management team member scheduled for: 04/08/2022 at 9:45 am.  Danford Bad, BSW, MSW, LCSW  Licensed Clinical Social Worker  [employer] [facility]  Mailing [place]. [address], [place], [name] [address] Physical Address[address], [place], [name] [address] Toll Free Main # [phone] Fax # [phone] Cell # [phone] [email or web address]

## 2022-03-18 NOTE — Patient Outreach (Signed)
  Care Coordination   Initial Visit Note   03/18/2022  Name: HETTIE ROSELLI MRN: [id number] DOB: [birth year]  LEEAN AMEZCUA is a 51 y.o. year old female who sees Selinda Flavin, MD for primary care. I spoke with [name] by phone today.  What matters to the patients health and wellness today?  Reduce and Manage My Symptoms of Anxiety and Depression.    Goals Addressed               This Visit's Progress     Reduce and Manage My Symptom of Anxiety and Depression. (pt-stated)   On track     Care Coordination Interventions:  PHQ2/PHQ9 Depression Screen Completed & Results Reviewed.   Suicidal Ideation/Homicidal Ideation Assessed - None Present. Solution-Focused Strategies Employed. Deep Breathing Exercises, Relaxation Techniques & Mindfulness Meditation Strategies Taught & Encouraged Daily.   Active Listening/Reflection Utilized.  Emotional Support Provided. Verbalization of Feelings Encouraged.  Provided Psychoeducation for Mental Health Concerns. Reviewed Mental Health Medications & Discussed Importance of Compliance. Quality of Sleep Assessed & Sleep Hygiene Techniques Promoted. Participation in Counseling Emphasized. Participation in Support Group Reviewed. Discussed Referral to Psychiatrist for Psychotropic Medication Management. Discussed Referral to Therapist for Psychotherapeutic Counseling Services.         SDOH assessments and interventions completed:  Yes.    SDOH Interventions Today    Flowsheet Row Most Recent Value  SDOH Interventions   Food Insecurity Interventions Intervention Not Indicated  Financial Strain Interventions Intervention Not Indicated  Housing Interventions Intervention Not Indicated  Physical Activity Interventions Patient Refused  Stress Interventions Offered [employer] Resources, Provide Counseling  Social Connections Interventions Intervention Not Indicated  Transportation Interventions Intervention Not Indicated,  Other (Comment)  [Care Guide Provided Transportation Resources]  Depression Interventions/Treatment  Referral to Psychiatry, Medication, Counseling        Care Coordination Interventions Activated:  Yes.   Care Coordination Interventions:  Yes, provided.   Follow up plan: Follow up call scheduled for 04/08/2022 at 9:45 am.  Encounter Outcome:  Pt. Visit Completed.   Danford Bad, BSW, MSW, LCSW  Licensed Restaurant manager, [facility]  Mailing [place] [address] [place], [name] [address] Physical Address-[address], [place], [name] [address] Toll Free Main # [phone] Fax # [phone] Cell # [phone] [email or web address]

## 2022-03-20 ENCOUNTER — Telehealth: Payer: Self-pay | Admitting: *Deleted

## 2022-03-20 NOTE — Telephone Encounter (Signed)
   Telephone encounter was:  Unsuccessful.  03/20/2022 Name: [name] MRN: [id number] DOB: 07/15/1971  Unsuccessful outbound call made today to assist with:  Transportation Needs   Outreach Attempt:  2nd Attempt  A HIPAA compliant voice message was left requesting a return call.  Instructed patient to call back at [phone]. Yehuda Mao Greenauer -[facility] [place], Population Health [phone] [address] [place] , [name] [address] Email : [email or web address]

## 2022-03-23 ENCOUNTER — Telehealth: Payer: Self-pay | Admitting: *Deleted

## 2022-03-25 ENCOUNTER — Telehealth: Payer: Self-pay | Admitting: *Deleted

## 2022-03-25 NOTE — Telephone Encounter (Signed)
   Telephone encounter was:  Successful.  03/25/2022 Name: [name] MRN: [id number] DOB: 05/03/1971  [name] is a 51 y.o. year old female who is a primary care patient of Selinda Flavin, MD . The community resource team was consulted for assistance with Transportation Needs   Care guide performed the following interventions: Patient provided with information about care guide support team and interviewed to confirm resource needs. Will mail patient information on Transportation options in [place]  Follow Up Plan:  No further follow up planned at this time. The patient has been provided with needed resources.  Yehuda Mao Greenauer -[facility] [place], Population Health [phone] [address] [place] , [name] [address] Email : [email or web address]

## 2022-04-07 ENCOUNTER — Ambulatory Visit: Payer: Self-pay | Admitting: *Deleted

## 2022-04-07 NOTE — Patient Instructions (Signed)
Visit Information  Thank you for taking time to visit with me today. Please don't hesitate to contact me if I can be of assistance to you before our next scheduled telephone appointment.  Following are the goals we discussed today:  Decrease Mountain Dew, increase water intake. Call Care Guide for transportation resources   Our next appointment is by telephone on 10/13  Please call the care guide team at [phone] if you need to cancel or reschedule your appointment.   Please call the Suicide and Crisis Lifeline: 988 call the [place] National Suicide Prevention Lifeline: (351)303-0195 or TTY: 856-308-1066 TTY (817) 454-9529) to talk to a trained counselor call 1-800-273-TALK (toll free, 24 hour hotline) call the [facility]: [phone] if you are experiencing a Mental Health or Behavioral Health Crisis or need someone to talk to.  Patient verbalizes understanding of instructions and care plan provided today and agrees to view in MyChart. Active MyChart status and patient understanding of how to access instructions and care plan via MyChart confirmed with patient.     The patient has been provided with contact information for the care management team and has been advised to call with any health related questions or concerns.   Kemper Durie, RN, MSN, [facility] Care Management Care Management Coordinator [phone]

## 2022-04-07 NOTE — Patient Outreach (Signed)
  Care Coordination   Follow Up Visit Note   04/07/2022 Name: [name] MRN: [id number] DOB: 1970-08-21  [name] is a 51 y.o. year old female who sees Selinda Flavin, MD for primary care. I spoke with  [name] by phone today.  What matters to the patients health and wellness today?  Report blood sugars remain slightly elevated, today was 203.  Still drinking lots of mountain dew, will consider decreasing.  Report transportation resources provided by care guide may not work as it is a bus and she is not able travel to the bus stop. She will call Care Guide to discuss other options.  Reminded of telephone visit with CSW tomorrow.  Denies any urgent concerns, encouraged to contact this care manager with questions.      Goals Addressed               This Visit's Progress     Management of DM, decrease A1C (pt-stated)   On track     Care Coordination Interventions: Provided education to patient about basic DM disease process Reviewed medications with patient and discussed importance of medication adherence Reviewed scheduled/upcoming provider appointments including: PCP follow up on 9/15 and cardiology follow up 9/18 Referral made to social work team for assistance with counseling for depression and DME (motorized chair) Referral made to community resources care guide team for assistance with transportation resources - Provided contact information for care guide as resources provided aren't safe for member Assessed social determinant of health barriers Discussed adhering to diabetic diet (particularly decreasing [employer] intake and increasing water intake) Discussed use of Jardiance, possibility of needing adjustment depending on new A1C results this week         SDOH assessments and interventions completed:  No     Care Coordination Interventions Activated:  Yes  Care Coordination Interventions:  Yes, provided   Follow up plan: Follow up call scheduled for  10/13    Encounter Outcome:  Pt. Visit Completed   Kemper Durie, RN, MSN, [facility] Care Management Care Management Coordinator [phone]

## 2022-04-08 ENCOUNTER — Encounter: Payer: Self-pay | Admitting: *Deleted

## 2022-04-08 ENCOUNTER — Ambulatory Visit: Payer: Self-pay | Admitting: *Deleted

## 2022-04-08 NOTE — Patient Outreach (Signed)
  Care Coordination   Follow Up Visit Note   04/08/2022  Name: [name] MRN: [id number] DOB: 1971-07-13  [name] is a 51 y.o. year old female who sees Selinda Flavin, MD for primary care. I spoke with [name] by phone today.  What matters to the patients health and wellness today?  Reduce and Manage My Symptoms of Anxiety and Depression.    Goals Addressed               This Visit's Progress     Reduce and Manage My Symptom of Anxiety and Depression. (pt-stated)   On track     Care Coordination Interventions:  Solution-Focused Strategies Employed. Deep Breathing Exercises, Relaxation Techniques & Mindfulness Meditation Strategies Reviewed & Encouraged Daily.   Active Listening/Reflection Utilized.  Emotional Support Provided. Verbalization of Feelings Encouraged.  Continued Discussion of Referral to Psychiatrist for Psychotropic Medication Management. Continued Discussion of Referral to Therapist for Psychotherapeutic Counseling Services. Review List of [employer] and Resources, Mailed on 04/08/2022.         SDOH assessments and interventions completed:  Yes.  Care Coordination Interventions Activated:  Yes.   Care Coordination Interventions:  Yes, provided.   Follow up plan: Follow up call scheduled for 04/20/2022 at 10:00 am.  Encounter Outcome:  Pt. Visit Completed.   Danford Bad, BSW, MSW, LCSW  Licensed Restaurant manager, fast food Health System  Mailing [employer] [address], [place], [name] [address] Physical Address-[address] [place], [name] [address] Toll Free Main # [phone] Fax # [phone] Cell # [phone] [email or web address]

## 2022-04-08 NOTE — Patient Instructions (Signed)
Visit Information  Thank you for taking time to visit with me today. Please don't hesitate to contact me if I can be of assistance to you.   Following are the goals we discussed today:   Goals Addressed               This Visit's Progress     Reduce and Manage My Symptom of Anxiety and Depression. (pt-stated)   On track     Care Coordination Interventions:  Solution-Focused Strategies Employed. Deep Breathing Exercises, Relaxation Techniques & Mindfulness Meditation Strategies Reviewed & Encouraged Daily.   Active Listening/Reflection Utilized.  Emotional Support Provided. Verbalization of Feelings Encouraged.  Continued Discussion of Referral to Psychiatrist for Psychotropic Medication Management. Continued Discussion of Referral to Therapist for Psychotherapeutic Counseling Services. Review List of [employer] and Resources, Mailed on 04/08/2022.         Our next appointment is by telephone on 04/20/2022 at 10:00 am.  Please call the care guide team at [phone] if you need to cancel or reschedule your appointment.   If you are experiencing a Mental Health or Behavioral Health Crisis or need someone to talk to, please call the Suicide and Crisis Lifeline: 988 call the [place] National Suicide Prevention Lifeline: 757 110 0626 or TTY: 772-740-2123 TTY 281-424-4615) to talk to a trained counselor call 1-800-273-TALK (toll free, 24 hour hotline) go to [facility] [address] [place] [phone]) call the [name] Crisis Line: (959) 791-8658 call 911  Patient verbalizes understanding of instructions and care plan provided today and agrees to view in MyChart. Active MyChart status and patient understanding of how to access instructions and care plan via MyChart confirmed with patient.     Telephone follow up appointment with care management team member scheduled for:  04/20/2022 at 10:00 am.  Danford Bad,  BSW, MSW, LCSW  Licensed Clinical Social Worker  [employer] [facility]  Mailing [place]. [address], [place], [name] [address] Physical Address-[address] [place], [name] [address] Toll Free Main # [phone] Fax # [phone] Cell # [phone] [email or web address]

## 2022-04-10 DIAGNOSIS — K219 Gastro-esophageal reflux disease without esophagitis: Secondary | ICD-10-CM | POA: Diagnosis not present

## 2022-04-10 DIAGNOSIS — R002 Palpitations: Secondary | ICD-10-CM | POA: Diagnosis not present

## 2022-04-10 DIAGNOSIS — R7989 Other specified abnormal findings of blood chemistry: Secondary | ICD-10-CM | POA: Diagnosis not present

## 2022-04-10 DIAGNOSIS — I739 Peripheral vascular disease, unspecified: Secondary | ICD-10-CM | POA: Diagnosis not present

## 2022-04-10 DIAGNOSIS — J449 Chronic obstructive pulmonary disease, unspecified: Secondary | ICD-10-CM | POA: Diagnosis not present

## 2022-04-10 DIAGNOSIS — E785 Hyperlipidemia, unspecified: Secondary | ICD-10-CM | POA: Diagnosis not present

## 2022-04-10 DIAGNOSIS — D649 Anemia, unspecified: Secondary | ICD-10-CM | POA: Diagnosis not present

## 2022-04-10 DIAGNOSIS — F329 Major depressive disorder, single episode, unspecified: Secondary | ICD-10-CM | POA: Diagnosis not present

## 2022-04-10 DIAGNOSIS — R5381 Other malaise: Secondary | ICD-10-CM | POA: Diagnosis not present

## 2022-04-10 DIAGNOSIS — I10 Essential (primary) hypertension: Secondary | ICD-10-CM | POA: Diagnosis not present

## 2022-04-10 DIAGNOSIS — F419 Anxiety disorder, unspecified: Secondary | ICD-10-CM | POA: Diagnosis not present

## 2022-04-10 DIAGNOSIS — Z1331 Encounter for screening for depression: Secondary | ICD-10-CM | POA: Diagnosis not present

## 2022-04-10 DIAGNOSIS — Z720 Tobacco use: Secondary | ICD-10-CM | POA: Diagnosis not present

## 2022-04-10 DIAGNOSIS — N183 Chronic kidney disease, stage 3 unspecified: Secondary | ICD-10-CM | POA: Diagnosis not present

## 2022-04-10 DIAGNOSIS — Z1389 Encounter for screening for other disorder: Secondary | ICD-10-CM | POA: Diagnosis not present

## 2022-04-10 DIAGNOSIS — E1165 Type 2 diabetes mellitus with hyperglycemia: Secondary | ICD-10-CM | POA: Diagnosis not present

## 2022-04-12 NOTE — Progress Notes (Unsigned)
Cardiology Office Note  Date: 04/13/2022   ID: ODELLA APPELHANS, DOB [birth year], MRN [id number]  PCP:  Royann Shivers, PA-C  Cardiologist:  Nona Dell, MD Electrophysiologist:  None   Chief Complaint  Patient presents with   Cardiac follow-up    History of Present Illness: [name] is a 51 y.o. female last seen in July.  She is here today for follow-up with assistant.  Reports no overall major change in status, perhaps less of a sense of palpitations.  Initial heart rate check today in the 40s mechanically, rechecked by me in the 80s by precordial pulse and likely related to frequent PVCs as already suspected.  Cardiac monitor in July showed sinus rhythm with frequent PVCs representing approximately 14% total beats, brief episodes of NSVT and also ventricular bigeminy and trigeminy.  Echocardiogram in July revealed LVEF approximately 40% with global hypokinesis, normal RV contraction, small pericardial effusion, mild to moderate mitral regurgitation, and mild aortic regurgitation.  We discussed plans to further optimize medical therapy as tolerated.  She just had lab work obtained by PCP which we are requesting.  Needs reassessment of renal function and potassium.  Past Medical History:  Diagnosis Date   Anemia 02/05/2012   Anxiety    Arthritis    Asthma    CKD (chronic kidney disease) stage 3, GFR 30-59 ml/min (HCC) 09/25/2021   COPD (chronic obstructive pulmonary disease) (HCC)    Depression    Essential hypertension    Fatty liver    GERD (gastroesophageal reflux disease)    Headache(784.0)    PAD (peripheral artery disease) (HCC)    Status post bilateral AKA   PE (pulmonary embolism)    Xarelto   Pneumonia 2013   Sepsis (HCC)    Type 2 diabetes mellitus (HCC)     Past Surgical History:  Procedure Laterality Date   ABDOMINAL AORTOGRAM W/LOWER EXTREMITY N/A 09/05/2021   Procedure: ABDOMINAL AORTOGRAM W/LOWER EXTREMITY;  Surgeon: [name], MD;  Location: [facility] INVASIVE CV LAB;  Service: Cardiovascular;  Laterality: N/A;   AMPUTATION Right 09/24/2021   Procedure: RIGHT ABOVE KNEE AMPUTATION;  Surgeon: [name], MD;  Location: [facility] OR;  Service: Vascular;  Laterality: Right;   APPENDECTOMY     CHOLECYSTECTOMY     DIAGNOSTIC LAPAROSCOPY     ESOPHAGEAL DILATION N/A 10/09/2014   Procedure: ESOPHAGEAL DILATION;  Surgeon: West Bali, MD;  Location: [facility] ORS;  Service: Endoscopy;  Laterality: N/A;  #15 and #16 savory   ESOPHAGOGASTRODUODENOSCOPY (EGD) WITH PROPOFOL N/A 10/09/2014   Dr. Darrick Penna: mild erosive gastritis, Savary dilation. Nausea and vomiting most likely due to uncontroleld blood sugars, reflux, and gastritis.     Current Outpatient Medications  Medication Sig Dispense Refill   ALPRAZolam (XANAX) 0.5 MG tablet Take 1 tablet (0.5 mg total) by mouth 3 (three) times daily as needed for anxiety. 10 tablet 0   aspirin EC 81 MG tablet Take 81 mg by mouth daily. Swallow whole.     atorvastatin (LIPITOR) 40 MG tablet Take 40 mg by mouth every evening.     Cholecalciferol (VITAMIN D3 PO) Take 1 tablet by mouth daily.     clopidogrel (PLAVIX) 75 MG tablet Take 75 mg by mouth daily.     docusate sodium (COLACE) 100 MG capsule Take 100 mg by mouth as needed.     escitalopram (LEXAPRO) 10 MG tablet Take 10 mg by mouth daily.     Ferrous Sulfate (IRON SUPPLEMENT PO)  Take by mouth.     gabapentin (NEURONTIN) 400 MG capsule Take 400 mg by mouth 4 (four) times daily.     Insulin Glargine (BASAGLAR KWIKPEN) 100 UNIT/ML Inject 40 Units into the skin 2 (two) times daily.     JARDIANCE 10 MG TABS tablet Take 10 mg by mouth daily.     linaclotide (LINZESS) 72 MCG capsule Take 72 mcg by mouth as needed.     metFORMIN (GLUCOPHAGE) 1000 MG tablet Take 1,000 mg by mouth 2 (two) times daily.     methocarbamol (ROBAXIN) 500 MG tablet Take 500 mg by mouth 2 (two) times daily.     ondansetron (ZOFRAN) 4 MG tablet Take 4 mg by mouth every 6 (six)  hours as needed for nausea or vomiting.     oxyCODONE-acetaminophen (PERCOCET) 7.5-325 MG tablet Take 1 tablet by mouth as needed for severe pain.     oxyCODONE-acetaminophen (PERCOCET/ROXICET) 5-325 MG tablet Take 1 tablet by mouth every 4 (four) hours as needed for severe pain. 20 tablet 0   pantoprazole (PROTONIX) 40 MG tablet Take 1 tablet (40 mg total) by mouth daily.     topiramate (TOPAMAX) 25 MG tablet Take 25 mg by mouth 2 (two) times daily.     vitamin B-12 1000 MCG tablet Take 1 tablet (1,000 mcg total) by mouth daily.     metoprolol succinate (TOPROL XL) 25 MG 24 hr tablet Take 1.5 tablets (37.5 mg total) by mouth daily. Deliver to patient 135 tablet 1   No current facility-administered medications for this visit.   Allergies:  Patient has no active allergies.   ROS: No syncope.  Physical Exam: VS:  BP (!) 140/66   Pulse 80   SpO2 97% , BMI There is no height or weight on file to calculate BMI.  Wt Readings from Last 3 Encounters:  10/15/21 191 lb (86.6 kg)  09/24/21 190 lb (86.2 kg)  09/05/21 185 lb (83.9 kg)    General: Patient appears comfortable at rest. HEENT: Conjunctiva and lids normal. Neck: Supple, no elevated JVP or carotid bruits. Lungs: Clear to auscultation, nonlabored breathing at rest. Cardiac: Regular rate and rhythm with frequent ectopy, no S3 or significant systolic murmur, no pericardial rub. Extremities: Status post bilateral AKA.  ECG:  An ECG dated 01/29/2022 was personally reviewed today and demonstrated:  Sinus rhythm with frequent PACs and PVCs.  Recent Labwork: 09/24/2021: ALT 9; AST 10 09/27/2021: Magnesium 2.4 10/02/2021: BUN 26; Creatinine, Ser 1.50; Hemoglobin 7.4; Platelets 318; Potassium 5.0; Sodium 126   Other Studies Reviewed Today:  Echocardiogram 02/03/2022:  1. Left ventricular ejection fraction, by estimation, is 40%. The left  ventricle has mildly decreased function. The left ventricle demonstrates  global hypokinesis. Left  ventricular diastolic parameters are  indeterminate. The average left ventricular  global longitudinal strain is -12.4 %. The global longitudinal strain is  abnormal.   2. Right ventricular systolic function is normal. The right ventricular  size is normal. Tricuspid regurgitation signal is inadequate for assessing  PA pressure.   3. A small pericardial effusion is present. The pericardial effusion is  circumferential. There is no evidence of cardiac tamponade.   4. The mitral valve is abnormal. Mild to moderate mitral valve  regurgitation. No evidence of mitral stenosis.   5. The aortic valve is tricuspid. There is mild calcification of the  aortic valve. There is mild thickening of the aortic valve. Aortic valve  regurgitation is mild.   6. The inferior vena cava is  normal in size with greater than 50%  respiratory variability, suggesting right atrial pressure of 3 mmHg.   Cardiac monitor July 2023: ZIO XT reviewed.  3 days analyzed.   Predominant rhythm is sinus with heart rate ranging from 83 bpm up to 116 bpm and average heart rate 94 bpm. There were rare PACs representing less than 1% total beats. There were frequent PVCs representing approximately 14% total beats, also frequent ventricular couplets and triplets with limited episodes of ventricular bigeminy and trigeminy.  Multiple, brief episodes of NSVT were noted, the longest of which was 7 beats.  There were no sustained ventricular arrhythmias. No pauses.  Assessment and Plan:  1.  HFrEF, LVEF approximately 40% by recent echocardiogram with global hypokinesis.  Could certainly have underlying ischemic heart disease in the setting of known severe PAD, but no angina at this time.  Also has frequent PVCs which could be a contributor or secondary to her cardiomyopathy.  Plan is to optimize GDMT as tolerated first.  Currently on aspirin, Lipitor, Plavix, Jardiance, and Toprol-XL.  Requesting lab work from PCP, needs repeat BMET  before considering addition of Entresto as a next step.  2.  Frequent PVCs with "pseudo-bradycardia" depending on mode of checking heart rate.  Increase Toprol-XL to 37.5 mg daily.  3.  Severe PAD status post bilateral AKA.  4.  Ongoing tobacco abuse.  Medication Adjustments/Labs and Tests Ordered: Current medicines are reviewed at length with the patient today.  Concerns regarding medicines are outlined above.   Tests Ordered: No orders of the defined types were placed in this encounter.   Medication Changes: Meds ordered this encounter  Medications   metoprolol succinate (TOPROL XL) 25 MG 24 hr tablet    Sig: Take 1.5 tablets (37.5 mg total) by mouth daily. Deliver to patient    Dispense:  135 tablet    Refill:  1    04/13/22 Dose Increase    Disposition:  Follow up  3 months.  Signed, Jonelle Sidle, MD, [facility] 04/13/2022 11:00 AM    [facility] at [facility] [address] [place], [place], [name] [address] Phone: [phone]; Fax: [phone]

## 2022-04-13 ENCOUNTER — Ambulatory Visit: Payer: Medicare Other | Attending: Cardiology | Admitting: Cardiology

## 2022-04-13 ENCOUNTER — Encounter: Payer: Self-pay | Admitting: Cardiology

## 2022-04-13 VITALS — BP 140/66 | HR 80

## 2022-04-13 DIAGNOSIS — I5020 Unspecified systolic (congestive) heart failure: Secondary | ICD-10-CM | POA: Diagnosis not present

## 2022-04-13 DIAGNOSIS — I429 Cardiomyopathy, unspecified: Secondary | ICD-10-CM | POA: Diagnosis not present

## 2022-04-13 DIAGNOSIS — I739 Peripheral vascular disease, unspecified: Secondary | ICD-10-CM | POA: Insufficient documentation

## 2022-04-13 DIAGNOSIS — I493 Ventricular premature depolarization: Secondary | ICD-10-CM | POA: Insufficient documentation

## 2022-04-13 MED ORDER — METOPROLOL SUCCINATE ER 25 MG PO TB24: 37.5000 mg | ORAL_TABLET | Freq: Every day | ORAL | 1 refills | Status: DC

## 2022-04-13 NOTE — Patient Instructions (Signed)
Medication Instructions:  Your physician has recommended you make the following change in your medication:  Increase toprol to 37.5 mg (1.5 tablets) once a day Continue all other medications as directed  Labwork: none  Testing/Procedures: none  Follow-Up:  Your physician recommends that you schedule a follow-up appointment in: 3 months  Any Other Special Instructions Will Be Listed Below (If Applicable).  If you need a refill on your cardiac medications before your next appointment, please call your pharmacy.

## 2022-04-16 ENCOUNTER — Telehealth: Payer: Self-pay | Admitting: *Deleted

## 2022-04-16 ENCOUNTER — Other Ambulatory Visit: Payer: Self-pay | Admitting: *Deleted

## 2022-04-16 DIAGNOSIS — Z79899 Other long term (current) drug therapy: Secondary | ICD-10-CM

## 2022-04-16 DIAGNOSIS — I5020 Unspecified systolic (congestive) heart failure: Secondary | ICD-10-CM

## 2022-04-16 MED ORDER — ENTRESTO 24-26 MG PO TABS
1.0000 | ORAL_TABLET | Freq: Two times a day (BID) | ORAL | 6 refills | Status: DC
Start: 2022-04-16 — End: 2022-04-27

## 2022-04-16 NOTE — Telephone Encounter (Signed)
-----   Message from Satira Sark, MD sent at 04/14/2022 10:51 AM EDT ----- Results reviewed.  See recent office note.  BUN 24, creatinine 1.33, and potassium 5.2.  Let's start Entresto 24/26 mg twice daily.  Recheck BMET 2 weeks thereafter.  We will hold off on addition of spironolactone given her current potassium level.

## 2022-04-16 NOTE — Telephone Encounter (Signed)
Patient informed and verbalized understanding of plan. Lab order faxed to [facility] and PCP office

## 2022-04-20 ENCOUNTER — Encounter: Payer: Self-pay | Admitting: *Deleted

## 2022-04-21 ENCOUNTER — Other Ambulatory Visit: Payer: Self-pay | Admitting: *Deleted

## 2022-04-21 NOTE — Patient Outreach (Signed)
  Care Coordination   04/21/2022  Name: [name] MRN: [id number] DOB: 06/17/1971   Care Coordination Outreach Attempts:  An unsuccessful telephone outreach was attempted today to offer the patient information about available care coordination services as a benefit of their health plan. HIPAA compliant message left on voicemail, providing contact information for CSW, encouraging patient to return CSW's call at her earliest convenience.  Follow Up Plan:  Additional outreach attempts will be made to offer the patient care coordination information and services.   Encounter Outcome:  No Answer.   Care Coordination Interventions Activated:  No.    Care Coordination Interventions:  No, not indicated.    Nat Christen, BSW, MSW, LCSW  Licensed Education officer, environmental Health System  Mailing [place] [address] [place], [place] [address] Physical Address-[address] [place], [place] [address] Toll Free Main # [phone] Fax # [phone] Cell # [phone] [email or web address]

## 2022-04-23 ENCOUNTER — Telehealth: Payer: Self-pay | Admitting: *Deleted

## 2022-04-23 NOTE — Chronic Care Management (AMB) (Signed)
  Care Coordination   Note   04/23/2022 Name: [name] MRN: [id number] DOB: 03-Aug-1970  [name] is a 51 y.o. year old female who sees [place], Aundra Millet, [place] for primary care. I reached out to Estell Harpin by phone today to reschedule follow up call for care coordination services with SW.   Follow up plan:  Unsuccessful telephone outreach attempt made. A HIPAA compliant phone message was left for the patient providing contact information and requesting a return call.  Encounter Outcome:  No Answer  [place]  Direct Dial: [phone]

## 2022-04-24 NOTE — Chronic Care Management (AMB) (Signed)
  Care Coordination  Outreach Note  04/24/2022 Name: [name] MRN: [id number] DOB: 30-Jun-1971   Care Coordination Outreach Attempts: A second unsuccessful outreach was attempted today to reschedule missed follow up call with RNCM for  care coordination services as a benefit of their health plan.     Follow Up Plan:  Additional outreach attempts will be made to offer the patient care coordination information and services.   Encounter Outcome:  No Answer  [place]  Direct Dial: [phone]

## 2022-04-24 NOTE — Chronic Care Management (AMB) (Signed)
  Care Coordination   Note   04/24/2022 Name: [name] MRN: [id number] DOB: Sep 02, 1970  [name] is a 51 y.o. year old female who sees [place], Aundra Millet, [place] for primary care. I reached out to Estell Harpin by phone today to reschedule for care coordination services.   Follow up plan:  Telephone appointment with care coordination team member scheduled for:  05/05/22  Encounter Outcome:  Pt. Scheduled  [place]  Direct Dial: [phone]

## 2022-04-27 ENCOUNTER — Other Ambulatory Visit: Payer: Self-pay | Admitting: *Deleted

## 2022-04-27 ENCOUNTER — Telehealth: Payer: Self-pay | Admitting: Cardiology

## 2022-04-27 MED ORDER — ENTRESTO 24-26 MG PO TABS: 1.0000 | ORAL_TABLET | Freq: Two times a day (BID) | ORAL | 1 refills | Status: DC

## 2022-04-27 NOTE — Telephone Encounter (Signed)
BMET lab work order re-faxed to [employer] 

## 2022-04-27 NOTE — Telephone Encounter (Signed)
Patient states she is going for lab work for on 10/6, she wants to go to [facility] she wants to make sure they have the lab work.

## 2022-05-01 DIAGNOSIS — Z79899 Other long term (current) drug therapy: Secondary | ICD-10-CM | POA: Diagnosis not present

## 2022-05-01 DIAGNOSIS — I5020 Unspecified systolic (congestive) heart failure: Secondary | ICD-10-CM | POA: Diagnosis not present

## 2022-05-05 ENCOUNTER — Encounter: Payer: Self-pay | Admitting: *Deleted

## 2022-05-05 ENCOUNTER — Ambulatory Visit: Payer: Self-pay | Admitting: *Deleted

## 2022-05-05 NOTE — Patient Instructions (Signed)
Visit Information  Thank you for taking time to visit with me today. Please don't hesitate to contact me if I can be of assistance to you.   Following are the goals we discussed today:   Goals Addressed               This Visit's Progress     Reduce and Manage My Symptom of Anxiety and Depression. (pt-stated)   On track     Care Coordination Interventions:  Deep Breathing Exercises, Relaxation Techniques & Mindfulness Meditation Strategies Encouraged Daily.   Active Listening/Reflection Utilized.  Emotional Support Provided. Verbalization of Feelings Encouraged.  Cognitive Behavioral Therapy Initiated. Thoroughly Reviewed List of [employer] and Resources. Encouraged Patient to [place], in An Effort to [employer].         Our next appointment is by telephone on 05/19/2022 at 1:15 pm.  Please call the care guide team at [phone] if you need to cancel or reschedule your appointment.   If you are experiencing a Mental Health or [place] or need someone to talk to, please call the Suicide and Crisis Lifeline: 988 call the [place] National Suicide Prevention Lifeline: 867-715-8667 or TTY: (201)879-8507 TTY 702 026 6113) to talk to a trained counselor call 1-800-273-TALK (toll free, 24 hour hotline) go to [facility] Urgent Care [address], [place] [phone]) call the [place]: [phone] call 911  Patient verbalizes understanding of instructions and care plan provided today and agrees to view in [place]. Active MyChart status and patient understanding of how to access instructions and care plan via MyChart confirmed with patient.     Telephone follow up appointment with care management team member scheduled for:  05/19/2022 at 1:15 pm.  Nat Christen, BSW, MSW, [place]  Licensed Clinical Social Worker  [place]  Mailing [place]. [address], [place], [place] [address] Physical Address[address], [place], [place] [address] Toll Free Main # [phone] Fax # [phone] Cell # [phone] [email or web address]

## 2022-05-05 NOTE — Patient Outreach (Signed)
  Care Coordination   Follow Up Visit Note   05/05/2022  Name: [name] MRN: [id number] DOB: 1970-10-29  [name] is a 51 y.o. year old female who sees [name], [name], [place] for primary care. I spoke with [name] by phone today.  What matters to the patients health and wellness today?  Reduce and Manage Symptoms of Anxiety and Depression.    Goals Addressed               This Visit's Progress     Reduce and Manage My Symptom of Anxiety and Depression. (pt-stated)   On track     Care Coordination Interventions:  Deep Breathing Exercises, Relaxation Techniques & Mindfulness Meditation Strategies Encouraged Daily.   Active Listening/Reflection Utilized.  Emotional Support Provided. Verbalization of Feelings Encouraged.  Cognitive Behavioral Therapy Initiated. Thoroughly Reviewed List of [employer] and Resources. Encouraged Patient to [place], in An Effort to [employer].       SDOH assessments and interventions completed:  Yes.   Care Coordination Interventions Activated:  Yes.    Care Coordination Interventions:  Yes, provided.    Follow up plan: Follow up call scheduled for 05/19/2022 at 1:15 pm.   Encounter Outcome:  Pt. Visit Completed.    Nat Christen, BSW, MSW, LCSW  Licensed Education officer, environmental Health System  Mailing [place] [address] [place], [place] [address] Physical Address-[address] [place], [place] [address] Toll Free Main # [phone] Fax # [phone] Cell # [phone] [email or web address]

## 2022-05-08 ENCOUNTER — Encounter: Payer: Self-pay | Admitting: *Deleted

## 2022-05-08 ENCOUNTER — Ambulatory Visit: Payer: Self-pay | Admitting: *Deleted

## 2022-05-08 NOTE — Telephone Encounter (Signed)
This encounter was created in error - please disregard.

## 2022-05-08 NOTE — Patient Outreach (Signed)
  Care Coordination   05/08/2022 Name: [name] MRN: [id number] DOB: 01/05/1971   Care Coordination Outreach Attempts:  An unsuccessful telephone outreach was attempted for a scheduled appointment today.  Follow Up Plan:  Additional outreach attempts will be made to offer the patient care coordination information and services.   Encounter Outcome:  No Answer  Care Coordination Interventions Activated:  No   Care Coordination Interventions:  No, not indicated    Valente David, RN, MSN, [facility] Care Management Care Management Coordinator [phone]

## 2022-05-12 ENCOUNTER — Encounter: Payer: Self-pay | Admitting: Cardiology

## 2022-05-12 NOTE — Telephone Encounter (Signed)
Error

## 2022-05-19 ENCOUNTER — Encounter: Payer: Self-pay | Admitting: *Deleted

## 2022-05-19 ENCOUNTER — Ambulatory Visit: Payer: Self-pay | Admitting: *Deleted

## 2022-05-19 NOTE — Patient Instructions (Signed)
Visit Information  Thank you for taking time to visit with me today. Please don't hesitate to contact me if I can be of assistance to you.   Following are the goals we discussed today:   Goals Addressed               This Visit's Progress     Reduce and Manage My Symptoms of Anxiety and Depression. (pt-stated)   On track     Care Coordination Interventions:  Active Listening/Reflection Utilized.  Emotional Support Provided. Verbalization of Feelings Encouraged.  Cognitive Behavioral Therapy Initiated. Review the Following List of [employer] and Resources in [place], [place], Mailed on 05/19/2022:  ~ Psychiatrists, Psychologists and [employer] in [place], [place]. Please Be Prepared to Discuss Counseling Agencies and Resources of Interest, During Our Next Scheduled Telephone [employer].          Our next appointment is by telephone on 06/01/2022 at 11:30 am.  Please call the care guide team at [phone] if you need to cancel or reschedule your appointment.   If you are experiencing a Mental Health or [place] or need someone to talk to, please call the Suicide and Crisis Lifeline: 988 call the [place] National Suicide Prevention Lifeline: (913) 415-9923 or TTY: 251-089-8831 TTY 929-060-2205) to talk to a trained counselor call 1-800-273-TALK (toll free, 24 hour hotline) go to [facility] [address] [place] [phone]) call the Impact: [phone] call 911  Patient verbalizes understanding of instructions and care plan provided today and agrees to view in [place]. Active MyChart status and patient understanding of how to access instructions and care plan via MyChart confirmed with patient.     Telephone follow up appointment with care management team member scheduled for:  06/01/2022 at 11:30 am.  Nat Christen, BSW, MSW, [place]  Licensed Clinical Social  Worker  [place]  Mailing [place]. [address] [place], [place] [address] Physical Address-[address], [place], [place] [address] Toll Free Main # [phone] Fax # [phone] Cell # [phone] [email or web address]

## 2022-05-19 NOTE — Patient Outreach (Signed)
  Care Coordination   Follow Up Visit Note   05/19/2022  Name: [name] MRN: [id number] DOB: 03/25/1971  [name] is a 51 y.o. year old female who sees [place], [name], [place] for primary care. I spoke with [name] by phone today.  What matters to the patients health and wellness today?  Reduce and Manage My Symptoms of Anxiety and Depression.    Goals Addressed               This Visit's Progress     Reduce and Manage My Symptoms of Anxiety and Depression. (pt-stated)   On track     Care Coordination Interventions:  Active Listening/Reflection Utilized.  Emotional Support Provided. Verbalization of Feelings Encouraged.  Cognitive Behavioral Therapy Initiated. Review the Following List of [employer] and Resources in [place], [place], Mailed on 05/19/2022:  ~ Psychiatrists, Psychologists and [employer] in [place], [place]. Please Be Prepared to Discuss Counseling Agencies and Resources of Interest, During Our Next Scheduled Telephone [employer].        SDOH assessments and interventions completed:  Yes.   Care Coordination Interventions Activated:  Yes.    Care Coordination Interventions:  Yes, provided.    Follow up plan: Follow up call scheduled for 06/01/2022 at 11:30 am.   Encounter Outcome:  Pt. Visit Completed.    [name], BSW, MSW, LCSW  Licensed Education officer, environmental Health System  Mailing [place] [address] [place], [place] [address] Physical Address-[address] [place], [place] [address] Toll Free Main # [phone] Fax # [phone] Cell # [phone] [email or web address]

## 2022-06-01 ENCOUNTER — Encounter: Payer: Self-pay | Admitting: *Deleted

## 2022-06-01 NOTE — Patient Outreach (Signed)
  Care Coordination   Follow Up Visit Note   06/01/2022  Name: [name] MRN: [id number] DOB: [birth year]  [name] is a 51 y.o. year old female who sees [place], Aundra Millet, [place] for primary care. I spoke with Estell Harpin by phone today.  What matters to the patients health and wellness today?  Reduce and Manage Symptoms of Anxiety and Depression.    Goals Addressed               This Visit's Progress     Reduce and Manage My Symptoms of Anxiety and Depression. (pt-stated)   On track     Care Coordination Interventions:  Active Listening/Reflection Utilized.  Emotional Support Provided. Client-Centered Therapy Performed. Cognitive Behavioral Therapy Initiated. Reviewed List of Counseling Agencies and Resources in [place], [place]. Review List of Medicaid Approved [employer] and Resources in [place], [place], Mailed on 06/01/2022. Be Prepared to [place] of Interest During Next Scheduled Telephone [employer], in An Effort to [employer].        SDOH assessments and interventions completed:  Yes.   Care Coordination Interventions Activated:  Yes.    Care Coordination Interventions:  Yes, provided.    Follow up plan: Follow up call scheduled for 06/15/2022 at 9:45 am.   Encounter Outcome:  Pt. Visit Completed.    Nat Christen, BSW, MSW, LCSW  Licensed Education officer, environmental Health System  Mailing [place] [address] [place], [place] [address] Physical Address-[address] [place], [place] [address] Toll Free Main # [phone] Fax # [phone] Cell # [phone] [email or web address]

## 2022-06-01 NOTE — Patient Instructions (Signed)
Visit Information  Thank you for taking time to visit with me today. Please don't hesitate to contact me if I can be of assistance to you.   Following are the goals we discussed today:   Goals Addressed               This Visit's Progress     Reduce and Manage My Symptoms of Anxiety and Depression. (pt-stated)   On track     Care Coordination Interventions:  Active Listening/Reflection Utilized.  Emotional Support Provided. Client-Centered Therapy Performed. Cognitive Behavioral Therapy Initiated. Reviewed List of Counseling Agencies and Resources in [place], [place]. Review List of Medicaid Approved [employer] and Resources in [place], [place], Mailed on 06/01/2022. Be Prepared to [place] of Interest During Next Scheduled Telephone [employer], in An Effort to [employer].        Our next appointment is by telephone on 06/15/2022 at 9:45 am.  Please call the care guide team at [phone] if you need to cancel or reschedule your appointment.   If you are experiencing a Mental Health or [place] or need someone to talk to, please call the Suicide and Crisis Lifeline: 988 call the [place] National Suicide Prevention Lifeline: (437)746-3230 or TTY: (515) 729-0685 TTY 702 872 7606) to talk to a trained counselor call 1-800-273-TALK (toll free, 24 hour hotline) go to [facility] [address], [place] [place] [phone]) call the [place]: [phone] call 911  Patient verbalizes understanding of instructions and care plan provided today and agrees to view in [place]. Active MyChart status and patient understanding of how to access instructions and care plan via MyChart confirmed with patient.     Telephone follow up appointment with care management team member scheduled for:  06/15/2022 at 9:45 am.  Nat Christen, BSW, MSW, [place]  Licensed  Clinical Social Worker  [place]  Mailing [place]. [address], [place], [place] [address] Physical Address-[address], [place], [place] [address] Toll Free Main # [phone] Fax # [phone] Cell # [phone] [email or web address]

## 2022-06-10 DIAGNOSIS — Z1329 Encounter for screening for other suspected endocrine disorder: Secondary | ICD-10-CM | POA: Diagnosis not present

## 2022-06-10 DIAGNOSIS — F17200 Nicotine dependence, unspecified, uncomplicated: Secondary | ICD-10-CM | POA: Diagnosis not present

## 2022-06-10 DIAGNOSIS — N183 Chronic kidney disease, stage 3 unspecified: Secondary | ICD-10-CM | POA: Diagnosis not present

## 2022-06-10 DIAGNOSIS — E1143 Type 2 diabetes mellitus with diabetic autonomic (poly)neuropathy: Secondary | ICD-10-CM | POA: Diagnosis not present

## 2022-06-10 DIAGNOSIS — K219 Gastro-esophageal reflux disease without esophagitis: Secondary | ICD-10-CM | POA: Diagnosis not present

## 2022-06-10 DIAGNOSIS — I10 Essential (primary) hypertension: Secondary | ICD-10-CM | POA: Diagnosis not present

## 2022-06-10 DIAGNOSIS — E039 Hypothyroidism, unspecified: Secondary | ICD-10-CM | POA: Diagnosis not present

## 2022-06-10 DIAGNOSIS — E538 Deficiency of other specified B group vitamins: Secondary | ICD-10-CM | POA: Diagnosis not present

## 2022-06-10 DIAGNOSIS — R7989 Other specified abnormal findings of blood chemistry: Secondary | ICD-10-CM | POA: Diagnosis not present

## 2022-06-10 DIAGNOSIS — E785 Hyperlipidemia, unspecified: Secondary | ICD-10-CM | POA: Diagnosis not present

## 2022-06-10 DIAGNOSIS — E559 Vitamin D deficiency, unspecified: Secondary | ICD-10-CM | POA: Diagnosis not present

## 2022-06-11 DIAGNOSIS — Z1329 Encounter for screening for other suspected endocrine disorder: Secondary | ICD-10-CM | POA: Diagnosis not present

## 2022-06-11 DIAGNOSIS — E785 Hyperlipidemia, unspecified: Secondary | ICD-10-CM | POA: Diagnosis not present

## 2022-06-11 DIAGNOSIS — E538 Deficiency of other specified B group vitamins: Secondary | ICD-10-CM | POA: Diagnosis not present

## 2022-06-11 DIAGNOSIS — E1143 Type 2 diabetes mellitus with diabetic autonomic (poly)neuropathy: Secondary | ICD-10-CM | POA: Diagnosis not present

## 2022-06-11 DIAGNOSIS — K219 Gastro-esophageal reflux disease without esophagitis: Secondary | ICD-10-CM | POA: Diagnosis not present

## 2022-06-11 DIAGNOSIS — I10 Essential (primary) hypertension: Secondary | ICD-10-CM | POA: Diagnosis not present

## 2022-06-11 DIAGNOSIS — N183 Chronic kidney disease, stage 3 unspecified: Secondary | ICD-10-CM | POA: Diagnosis not present

## 2022-06-11 DIAGNOSIS — F17200 Nicotine dependence, unspecified, uncomplicated: Secondary | ICD-10-CM | POA: Diagnosis not present

## 2022-06-11 DIAGNOSIS — R7989 Other specified abnormal findings of blood chemistry: Secondary | ICD-10-CM | POA: Diagnosis not present

## 2022-06-11 DIAGNOSIS — E559 Vitamin D deficiency, unspecified: Secondary | ICD-10-CM | POA: Diagnosis not present

## 2022-06-15 ENCOUNTER — Encounter: Payer: Self-pay | Admitting: *Deleted

## 2022-06-15 ENCOUNTER — Ambulatory Visit: Payer: Self-pay | Admitting: *Deleted

## 2022-06-15 DIAGNOSIS — Z9189 Other specified personal risk factors, not elsewhere classified: Secondary | ICD-10-CM

## 2022-06-15 NOTE — Patient Instructions (Signed)
Visit Information  Thank you for taking time to visit with me today. Please don't hesitate to contact me if I can be of assistance to you.   Following are the goals we discussed today:   Goals Addressed               This Visit's Progress     Reduce and Manage My Symptoms of Anxiety and Depression. (pt-stated)   On track     Care Coordination Interventions:  Active Listening Utilized.  Caregiver Stress Acknowledged. Caregiver Resources Reviewed. Caregiver and Emotional Support Provided. Solution-Focused Strategies Employed. Cognitive Behavioral Therapy Initiated. Continue Biochemist, clinical, in An Effort to [employer], from List Provided: ~ Economist and Resources in [place], [place]. ~ [employer] and Resources in [place], [place].          Our next appointment is by telephone on 07/13/2022 at 9:00 am.  Please call the care guide team at [phone] if you need to cancel or reschedule your appointment.   If you are experiencing a Mental Health or Behavioral Health Crisis or need someone to talk to, please call the Suicide and Crisis Lifeline: 988 call the [place] National Suicide Prevention Lifeline: 660 005 3770 or TTY: 320-850-3656 TTY 575-194-2618) to talk to a trained counselor call 1-800-273-TALK (toll free, 24 hour hotline) go to [facility] [address] [place] [phone]) call the [facility] Crisis Line: [phone] call 911  Patient verbalizes understanding of instructions and care plan provided today and agrees to view in MyChart. Active MyChart status and patient understanding of how to access instructions and care plan via MyChart confirmed with patient.     Telephone follow up appointment with care management team member scheduled for:  07/13/2022 at 9:00 am.  Danford Bad, BSW, MSW, LCSW  Licensed Clinical  Social Worker  [facility]  Mailing [place]. [address] [place], [name] [address] Physical Address-[address] [place], [name] [address] Toll Free Main # [phone] Fax # [phone] Cell # [phone] [email or web address]

## 2022-06-15 NOTE — Patient Outreach (Signed)
Care Coordination   Follow Up Visit Note   06/15/2022 Name: [name] MRN: [id number] DOB: 01/01/1971  [name] is a 51 y.o. year old female who sees [place], Aundra Millet, [place] for primary care. I spoke with  [name] by phone today.  What matters to the patients health and wellness today?  Blood sugar and phantom limb pain management    Goals Addressed               This Visit's Progress     Patient Stated     Management of DM, decrease A1C (pt-stated)        Care Coordination Interventions: Provided education to patient about basic DM disease process Reviewed medications with patient and discussed importance of medication adherence Reviewed scheduled/upcoming provider appointments including: PCP follow up on 06/19/22 Referral made to social work team for assistance with help with PCS services. Hours were cut from 30 to 26 for unknown reason. Has [place] Medicaid. Also needs assistance with obtaining a motorized wheelchair. Last application was denied. Patient has bil AKA. Referral made to community resources care guide team for assistance with dental resources for multiple broken or loose teeth Assessed social determinant of health barriers Discussed home blood sugar monitoring.  Testing each morning but doesn't record. 167 this morning. Advised to test at least daily and to record. Since she has an appt with PCP this week, I asked her to check and record in the morning fasting and 2 hours after a meal and to take those readings to her visit for review. Discussed that Vania Rea will not be on her formulary for 2024. She is going to take her formulary with her to PCP visit this week to discuss her options.  Encouraged to schedule diabetic eye exam that is overdue Discussed family/social support Sister helps with transportation when she can Has a PCS aide that comes from 8:00-1:00 Mon-Fri Provided with [facility] contact number and encouraged to reach out as  needed       Other     Phantom Limb Pain Management        Care Coordination Interventions: Reviewed provider established plan for pain management Discussed importance of adherence to all scheduled medical appointments Counseled on the importance of reporting any/all new or changed pain symptoms or management strategies to pain management provider Advised patient to report to care team affect of pain on daily activities Discussed use of relaxation techniques and/or diversional activities to assist with pain reduction (distraction, imagery, relaxation, massage, acupressure, TENS, heat, and cold application Reviewed with patient prescribed pharmacological and nonpharmacological pain relief strategies Advised patient to discuss if ketamine would be a good treatment option for her phantom pain with provider Assessed social determinant of health barriers Discussed Bil AKA. Most recent was in 09/2021. Healed up nicely. Still a little tight.  Discussed mobility. Using a wheelchair but would like a motorized wheelchair. Application denied. Working with PCP on this and I will loop Nat Christen, LCSW [phone]) in as well Provided verbal and written information on the use of Ketamine for phantom limb pain She has some oxycodone left from her surgery that she takes if she absolutely needs to. It helps some. She has a limited supply so tries not to use it. Neurontin doesn't help. Therapeutic listening utilized regarding pain and effect on daily life. Has difficulty sleeping because of it. Encouraged to talk with LCSW regarding depression and non-pharmacological ways to manage pain Encouraged to reach out to [facility] as  needed        SDOH assessments and interventions completed:  Yes  SDOH Interventions Today    Flowsheet Row Most Recent Value  SDOH Interventions   Food Insecurity Interventions Intervention Not Indicated  Housing Interventions Intervention Not Indicated  Transportation  Interventions Intervention Not Indicated  Financial Strain Interventions Intervention Not Indicated        Care Coordination Interventions Activated:  Yes  Care Coordination Interventions:  Yes, provided   Follow up plan: Follow up call scheduled for 07/06/22 with [facility]    Encounter Outcome:  Pt. Visit Completed   Demetrios Loll, BSN, RN-BC RN Care Coordinator [facility]  [facility] Direct Dial: [phone] Main #: [phone]

## 2022-06-15 NOTE — Patient Outreach (Signed)
  Care Coordination   Follow Up Visit Note   06/15/2022  Name: [name] MRN: [id number] DOB: [birth year]  [name] is a 51 y.o. year old female who sees [name], [name], [place] for primary care. I spoke with [name] by phone today.  What matters to the patients health and wellness today?   Reduce and Manage Symptoms of Anxiety and Depression.   Goals Addressed               This Visit's Progress     Reduce and Manage My Symptoms of Anxiety and Depression. (pt-stated)   On track     Care Coordination Interventions:  Active Listening Utilized.  Caregiver Stress Acknowledged. Caregiver Resources Reviewed. Caregiver and Emotional Support Provided. Solution-Focused Strategies Employed. Cognitive Behavioral Therapy Initiated. Continue Biochemist, clinical, in An Effort to [employer], from List Provided: ~ Economist and Resources in [place], [place]. ~ [employer] and Resources in [place],     [place].          SDOH assessments and interventions completed:  Yes.  Care Coordination Interventions Activated:  Yes.   Care Coordination Interventions:  Yes, provided.   Follow up plan: Follow up call scheduled for 07/13/2022 at 9:00 am.  Encounter Outcome:  Pt. Visit Completed.   [name], BSW, MSW, LCSW  Licensed Restaurant manager, fast food Health System  Mailing [place] [address] [place], [name] [address] Physical Address-[address], [place], [name] [address] Toll Free Main # [phone] Fax # [phone] Cell # [phone] [email or web address]

## 2022-06-17 ENCOUNTER — Telehealth: Payer: Self-pay

## 2022-06-17 NOTE — Telephone Encounter (Signed)
   Telephone encounter was:  Successful.  06/17/2022 Name: [name] MRN: [id number] DOB: 1971/03/06  [name] is a 51 y.o. year old female who is a primary care patient of [name] . The community resource team was consulted for assistance with  Dentist  Care guide performed the following interventions: Patient provided with information about care guide support team and interviewed to confirm resource needs.  Follow Up Plan:  No further follow up planned at this time. The patient has been provided with needed resources.    Lenard Forth [facility] Guide, [facility], Care Management  [phone] [address] [place], [place], [name] [address] Phone: [phone] Email: [email or web address]

## 2022-06-19 DIAGNOSIS — E785 Hyperlipidemia, unspecified: Secondary | ICD-10-CM | POA: Diagnosis not present

## 2022-06-19 DIAGNOSIS — N183 Chronic kidney disease, stage 3 unspecified: Secondary | ICD-10-CM | POA: Diagnosis not present

## 2022-06-19 DIAGNOSIS — I739 Peripheral vascular disease, unspecified: Secondary | ICD-10-CM | POA: Diagnosis not present

## 2022-06-19 DIAGNOSIS — D649 Anemia, unspecified: Secondary | ICD-10-CM | POA: Diagnosis not present

## 2022-06-19 DIAGNOSIS — I10 Essential (primary) hypertension: Secondary | ICD-10-CM | POA: Diagnosis not present

## 2022-06-19 DIAGNOSIS — R7989 Other specified abnormal findings of blood chemistry: Secondary | ICD-10-CM | POA: Diagnosis not present

## 2022-06-19 DIAGNOSIS — Z23 Encounter for immunization: Secondary | ICD-10-CM | POA: Diagnosis not present

## 2022-06-19 DIAGNOSIS — E538 Deficiency of other specified B group vitamins: Secondary | ICD-10-CM | POA: Diagnosis not present

## 2022-06-19 DIAGNOSIS — E559 Vitamin D deficiency, unspecified: Secondary | ICD-10-CM | POA: Diagnosis not present

## 2022-06-19 DIAGNOSIS — R002 Palpitations: Secondary | ICD-10-CM | POA: Diagnosis not present

## 2022-06-19 DIAGNOSIS — E1165 Type 2 diabetes mellitus with hyperglycemia: Secondary | ICD-10-CM | POA: Diagnosis not present

## 2022-06-19 DIAGNOSIS — J449 Chronic obstructive pulmonary disease, unspecified: Secondary | ICD-10-CM | POA: Diagnosis not present

## 2022-06-29 ENCOUNTER — Encounter: Payer: Self-pay | Admitting: Nurse Practitioner

## 2022-06-29 ENCOUNTER — Ambulatory Visit: Payer: Medicare Other | Attending: Nurse Practitioner | Admitting: Nurse Practitioner

## 2022-06-29 VITALS — BP 111/67 | HR 81

## 2022-06-29 DIAGNOSIS — I493 Ventricular premature depolarization: Secondary | ICD-10-CM

## 2022-06-29 DIAGNOSIS — I10 Essential (primary) hypertension: Secondary | ICD-10-CM

## 2022-06-29 DIAGNOSIS — R0989 Other specified symptoms and signs involving the circulatory and respiratory systems: Secondary | ICD-10-CM | POA: Diagnosis not present

## 2022-06-29 DIAGNOSIS — R002 Palpitations: Secondary | ICD-10-CM

## 2022-06-29 DIAGNOSIS — R001 Bradycardia, unspecified: Secondary | ICD-10-CM

## 2022-06-29 DIAGNOSIS — Z89611 Acquired absence of right leg above knee: Secondary | ICD-10-CM

## 2022-06-29 DIAGNOSIS — I739 Peripheral vascular disease, unspecified: Secondary | ICD-10-CM

## 2022-06-29 DIAGNOSIS — E785 Hyperlipidemia, unspecified: Secondary | ICD-10-CM | POA: Diagnosis not present

## 2022-06-29 DIAGNOSIS — I3139 Other pericardial effusion (noninflammatory): Secondary | ICD-10-CM | POA: Diagnosis not present

## 2022-06-29 DIAGNOSIS — I5022 Chronic systolic (congestive) heart failure: Secondary | ICD-10-CM

## 2022-06-29 DIAGNOSIS — Z720 Tobacco use: Secondary | ICD-10-CM | POA: Diagnosis not present

## 2022-06-29 DIAGNOSIS — Z89612 Acquired absence of left leg above knee: Secondary | ICD-10-CM | POA: Insufficient documentation

## 2022-06-29 MED ORDER — METOPROLOL SUCCINATE ER 50 MG PO TB24: 50.0000 mg | ORAL_TABLET | Freq: Every day | ORAL | 3 refills | Status: DC

## 2022-06-29 NOTE — Patient Instructions (Signed)
Medication Instructions:  INCREASE Metoprolol to 50 mg daily  Labwork: None today  Testing/Procedures: Your physician has requested that you have an echocardiogram. Echocardiography is a painless test that uses sound waves to create images of your heart. It provides your doctor with information about the size and shape of your heart and how well your heart's chambers and valves are working. This procedure takes approximately one hour. There are no restrictions for this procedure. Please do NOT wear cologne, perfume, aftershave, or lotions (deodorant is allowed). Please arrive 15 minutes prior to your appointment time.   Follow-Up: 6 weeks  Any Other Special Instructions Will Be Listed Below (If Applicable).  If you need a refill on your cardiac medications before your next appointment, please call your pharmacy.

## 2022-06-29 NOTE — Progress Notes (Signed)
Cardiology Office Note:    Date:  06/30/2022   ID:  [name], DOB [birth year], MRN [id number]  PCP:  Sheela StackSkillman, Katherine E, PA-C   [facility] Providers Cardiologist:  Nona DellSamuel McDowell, MD     Referring MD: Royann ShiversSkillman, Katherine E, *   CC: Here for follow-up evaluation  History of Present Illness:    [name] is a 51 y.o. female with a hx of the following:  HFmrEF Frequent PVC's, hx of palpitations HTN HLD PAD, s/p bilateral AKA Asthma CKD stage 3 T2DM Tobacco abuse Pericardial effusion  Patient is a 51 year old female with past medical history as mentioned above.  She has a history of following vascular surgery for history of early onset of peripheral vascular occlusive disease, history of multiple bilateral procedures in [place]. She is now s/p bilateral AKA and continues to use tobacco.   Was referred to cardiology services in July 2023 and saw Dr. Diona BrownerMcDowell for the first time on January 29, 2022.  Chief complaint of palpitations and documented PVCs.  She reported intermittent rapid heart rates and sometimes irregular heartbeats, she became concerned when she saw her heart rate in the 30s by pulse ox.  EKG revealed sinus rhythm, frequent PVCs and PACs.  Echocardiogram in 2013 revealed EF 65 to 70%.  72 hour Zio patch revealed sinus rhythm, rare PACs, frequent PVCs, multiple brief bursts of NSVT, no pauses or other sustained ventricular arrhythmias.  Updated echocardiogram arranged by Dr. Diona BrownerMcDowell revealed EF reduced at 40%, small pericardial effusion noted that was circumferential, without evidence of cardiac tamponade, mild to moderate MR, mild calcification of aortic valve, mild AR.  Dr. Diona BrownerMcDowell recommended to switch carvedilol to Toprol XL 25 mg daily and to continue Jardiance.   Today she presents for follow-up evaluation with CC of fatigue and weakness. Denies any CP, SHOB, syncope, presyncope, dizziness, orthopnea, PND, swelling significant weight changes, or  claudication. Does experience occasional palpitations at night, lasting only a few minutes in duration, otherwise denies any other acute cardiac complaints or concerns. Does not check BP at home, does not own a cuff. Denies any other questions or concerns.   Past Medical History:  Diagnosis Date   Anemia 02/05/2012   Anxiety    Arthritis    Asthma    CKD (chronic kidney disease) stage 3, GFR 30-59 ml/min (HCC) 09/25/2021   COPD (chronic obstructive pulmonary disease) (HCC)    Depression    Essential hypertension    Fatty liver    GERD (gastroesophageal reflux disease)    Headache(784.0)    PAD (peripheral artery disease) (HCC)    Status post bilateral AKA   PE (pulmonary embolism)    Xarelto   Pneumonia 2013   Sepsis (HCC)    Type 2 diabetes mellitus (HCC)     Past Surgical History:  Procedure Laterality Date   ABDOMINAL AORTOGRAM W/LOWER EXTREMITY N/A 09/05/2021   Procedure: ABDOMINAL AORTOGRAM W/LOWER EXTREMITY;  Surgeon: Leonie DouglasHawken, Thomas N, MD;  Location: [facility];  Service: Cardiovascular;  Laterality: N/A;   AMPUTATION Right 09/24/2021   Procedure: RIGHT ABOVE KNEE AMPUTATION;  Surgeon: Leonie DouglasHawken, Thomas N, MD;  [facility];  Service: Vascular;  Laterality: Right;   APPENDECTOMY     CHOLECYSTECTOMY     DIAGNOSTIC LAPAROSCOPY     ESOPHAGEAL DILATION N/A 10/09/2014   Procedure: ESOPHAGEAL DILATION;  Surgeon: West BaliSandi L Fields, MD;  Location: [facility] ORS;  Service: Endoscopy;  Laterality: N/A;  #15 and #16 savory  ESOPHAGOGASTRODUODENOSCOPY (EGD) WITH PROPOFOL N/A 10/09/2014   Dr. Darrick Penna: mild erosive gastritis, Savary dilation. Nausea and vomiting most likely due to uncontroleld blood sugars, reflux, and gastritis.     Current Medications: Current Meds  Medication Sig   ALPRAZolam (XANAX) 0.5 MG tablet Take 1 tablet (0.5 mg total) by mouth 3 (three) times daily as needed for anxiety.   aspirin EC 81 MG tablet Take 81 mg by mouth daily. Swallow whole.   atorvastatin  (LIPITOR) 80 MG tablet SMARTSIG:1 Tablet(s) By Mouth Every Evening   Cholecalciferol (VITAMIN D3 PO) Take 1 tablet by mouth daily.   clopidogrel (PLAVIX) 75 MG tablet Take 75 mg by mouth daily.   docusate sodium (COLACE) 100 MG capsule Take 100 mg by mouth as needed.   escitalopram (LEXAPRO) 10 MG tablet Take 10 mg by mouth daily.   Ferrous Sulfate (IRON SUPPLEMENT PO) Take by mouth.   gabapentin (NEURONTIN) 400 MG capsule Take 400 mg by mouth 4 (four) times daily.   Insulin Glargine (BASAGLAR KWIKPEN) 100 UNIT/ML Inject 40 Units into the skin 2 (two) times daily.   JARDIANCE 25 MG TABS tablet Take 25 mg by mouth daily.   linaclotide (LINZESS) 72 MCG capsule Take 72 mcg by mouth as needed.   metFORMIN (GLUCOPHAGE) 1000 MG tablet Take 1,000 mg by mouth 2 (two) times daily.   methocarbamol (ROBAXIN) 500 MG tablet Take 500 mg by mouth 2 (two) times daily.   ondansetron (ZOFRAN) 4 MG tablet Take 4 mg by mouth every 6 (six) hours as needed for nausea or vomiting.   oxyCODONE-acetaminophen (PERCOCET) 7.5-325 MG tablet Take 1 tablet by mouth as needed for severe pain.   oxyCODONE-acetaminophen (PERCOCET/ROXICET) 5-325 MG tablet Take 1 tablet by mouth every 4 (four) hours as needed for severe pain.   pantoprazole (PROTONIX) 40 MG tablet Take 1 tablet (40 mg total) by mouth daily.   sacubitril-valsartan (ENTRESTO) 24-26 MG Take 1 tablet by mouth 2 (two) times daily. 30 day free voucher will be faxed   topiramate (TOPAMAX) 25 MG tablet Take 25 mg by mouth 2 (two) times daily.   vitamin B-12 1000 MCG tablet Take 1 tablet (1,000 mcg total) by mouth daily.    metoprolol succinate (TOPROL XL) 25 MG 24 hr tablet Take 1.5 tablets (37.5 mg total) by mouth daily. Deliver to patient     Allergies:   Patient has no active allergies.   Social History   Socioeconomic History   Marital status: Single    Spouse name: Not on file   Number of children: Not on file   Years of education: 12   Highest education  level: 12th grade  Occupational History   Not on file  Tobacco Use   Smoking status: Every Day    Packs/day: 1.00    Years: 20.00    Total pack years: 20.00    Types: Cigarettes    Passive exposure: Current   Smokeless tobacco: Never  Vaping Use   Vaping Use: Former  Substance and Sexual Activity   Alcohol use: No   Drug use: No   Sexual activity: Yes    Birth control/protection: Injection  Other Topics Concern   Not on file  Social History Narrative   Not on file   Social Determinants of Health   Financial Resource Strain: Low Risk  (06/15/2022)   Overall Financial Resource Strain (CARDIA)    Difficulty of Paying Living Expenses: Not hard at all  Food Insecurity: No Food Insecurity (06/15/2022)  Hunger Vital Sign    Worried About Running Out of Food in the Last Year: Never true    Ran Out of Food in the Last Year: Never true  Transportation Needs: No Transportation Needs (06/15/2022)   PRAPARE - Administrator, Civil Service (Medical): No    Lack of Transportation (Non-Medical): No  Physical Activity: Inactive (03/18/2022)   Exercise Vital Sign    Days of Exercise per Week: 0 days    Minutes of Exercise per Session: 0 min  Stress: Stress Concern Present (03/18/2022)   [employer] of Occupational Health - Occupational Stress Questionnaire    Feeling of Stress : Rather much  Social Connections: Unknown (03/18/2022)   Social Connection and Isolation Panel [NHANES]    Frequency of Communication with Friends and Family: More than three times a week    Frequency of Social Gatherings with Friends and Family: More than three times a week    Attends Religious Services: More than 4 times per year    Active Member of [employer] or Organizations: No    Attends Banker Meetings: Never    Marital Status: Not on file     Family History: The patient's family history includes Breast cancer in her mother; Diabetes in her brother and sister; Kidney disease  in her sister. There is no history of Colon cancer or Liver disease.  ROS:   Review of Systems  Constitutional:  Positive for malaise/fatigue. Negative for chills, diaphoresis, fever and weight loss.  HENT: Negative.    Eyes: Negative.   Respiratory: Negative.    Cardiovascular:  Positive for palpitations. Negative for chest pain, orthopnea, claudication, leg swelling and PND.  Gastrointestinal: Negative.   Genitourinary: Negative.   Musculoskeletal: Negative.   Skin: Negative.   Neurological: Negative.   Endo/Heme/Allergies: Negative.   Psychiatric/Behavioral: Negative.      Please see the history of present illness.    All other systems reviewed and are negative.  EKGs/Labs/Other Studies Reviewed:    The following studies were reviewed today:   EKG:  EKG is ordered today.  The ekg ordered today demonstrates NSR, 81 bpm, with frequent PVC's, possible IVC delay, otherwise no acute ischemic changes.   72 hour Zio monitor on 02/16/22: Predominant rhythm is sinus with heart rate ranging from 83 bpm up to 116 bpm and average heart rate 94 bpm. There were rare PACs representing less than 1% total beats. There were frequent PVCs representing approximately 14% total beats, also frequent ventricular couplets and triplets with limited episodes of ventricular bigeminy and trigeminy.  Multiple, brief episodes of NSVT were noted, the longest of which was 7 beats.  There were no sustained ventricular arrhythmias. No pauses.  2D Echo on 02/03/22:  1. Left ventricular ejection fraction, by estimation, is 40%. The left  ventricle has mildly decreased function. The left ventricle demonstrates  global hypokinesis. Left ventricular diastolic parameters are  indeterminate. The average left ventricular  global longitudinal strain is -12.4 %. The global longitudinal strain is  abnormal.   2. Right ventricular systolic function is normal. The right ventricular  size is normal. Tricuspid  regurgitation signal is inadequate for assessing  PA pressure.   3. A small pericardial effusion is present. The pericardial effusion is  circumferential. There is no evidence of cardiac tamponade.   4. The mitral valve is abnormal. Mild to moderate mitral valve  regurgitation. No evidence of mitral stenosis.   5. The aortic valve is tricuspid. There is mild  calcification of the  aortic valve. There is mild thickening of the aortic valve. Aortic valve  regurgitation is mild.   6. The inferior vena cava is normal in size with greater than 50%  respiratory variability, suggesting right atrial pressure of 3 mmHg.   Comparison(s): Echocardiogram done 01/28/12 showed an EF of 65-70%.  Recent Labs: 09/24/2021: ALT 9 09/27/2021: Magnesium 2.4 10/02/2021: BUN 26; Creatinine, Ser 1.50; Hemoglobin 7.4; Platelets 318; Potassium 5.0; Sodium 126  Recent Lipid Panel No results found for: "CHOL", "TRIG", "HDL", "CHOLHDL", "VLDL", "LDLCALC", "LDLDIRECT"  Physical Exam:    VS:  BP 111/67   Pulse 81 Comment: EKG done  SpO2 98%     Wt Readings from Last 3 Encounters:  10/15/21 191 lb (86.6 kg)  09/24/21 190 lb (86.2 kg)  09/05/21 185 lb (83.9 kg)     GEN: Well nourished, well developed in no acute distress HEENT: Normal NECK: No JVD; Carotid bruit along right neck, no carotid bruit along left neck.  CARDIAC: S1/S2, regular rate with abnormal rhythm due to early beats noted, no murmurs, rubs, gallops; 2+ peripheral pulses throughout, strong and equal bilaterally RESPIRATORY:  Clear and diminished to auscultation without rales, wheezing or rhonchi  MUSCULOSKELETAL:  No edema; Bilateral AKA, wheelchair bound, otherwise normal SKIN: Warm and dry NEUROLOGIC:  Alert and oriented x 3 PSYCHIATRIC:  Normal affect   ASSESSMENT:    1. Heart failure with mildly reduced ejection fraction (HFmrEF) (HCC)   2. Frequent PVCs   3. Bradycardia   4. Palpitations   5. Hypertension, unspecified type   6. PAD  (peripheral artery disease) (HCC)   7. Right carotid bruit   8. Status post above-knee amputation of both lower extremities (HCC)   9. Hyperlipidemia, unspecified hyperlipidemia type   10. Tobacco abuse   11. Pericardial effusion    PLAN:    In order of problems listed above:  HFmrEF TTE in July 2023 revealed EF decreased at 40%, global hypokinesis of left ventricle noted.  Etiology unclear, although could be due to ischemic cardiomyopathy. Euvolemic and well compensated on exam. Continue aspirin, Lipitor, Plavix, Jardiance, Toprol-XL, and Entresto.  Will increase Toprol-XL to 50 mg daily. Low sodium diet, fluid restriction <2L, and daily weights encouraged. Educated to contact our office for weight gain of 2 lbs overnight or 5 lbs in one week. Heart healthy diet encouraged.  At next OV, will reevaluate to see if Sherryll Burger can be titrated up.  Palpitations, frequent PVC's EKG today reveals frequent PVCs.  Does admit to occasional palpitations at night, lasting only few minutes in duration.  Will increase Toprol-XL as mentioned above.  Did admit to drinking [facility], and I encouraged her to cut back on this and to eliminate caffeine.  Continue current medication regimen. Heart healthy diet encouraged.   HTN Blood pressure today 111/67. Will increase Toprol XL as mentioned above. Discussed to monitor BP at home at least 2 hours after medications and sitting for 5-10 minutes. Given BP log and recommended to obtain an OMRON cuff. Heart healthy diet recommended.   PAD, s/p Bilateral AKA, right carotid bruit Following [facility].  Continue aspirin, Lipitor, and Plavix.  Heart healthy diet recommended.  Continue to follow-up with [facility].  Carotid bruit heard along right sided neck, plan to discuss carotid Doppler at next OV.  HLD Labs 03/2022 revealed total cholesterol 226, HDL 35, LDL 162, and triglycerides 159.  Continue Lipitor.  Heart healthy diet recommended.  6. Tobacco abuse Patient continues to  smoke; however she is not interested in quitting smoking at this time.  She politely declined resources that I offered her.  7. Pericardial effusion Small pericardial effusion noted on echocardiogram in July 2023, this was noted to be circumferential without evidence of cardiac tamponade.  Will update TTE at this time.  8. Disposition: Follow-up with me or Dr. Diona Browner in 6 weeks or sooner if anything changes.   Medication Adjustments/Labs and Tests Ordered: Current medicines are reviewed at length with the patient today.  Concerns regarding medicines are outlined above.  Orders Placed This Encounter  Procedures   EKG 12-Lead   ECHOCARDIOGRAM COMPLETE   Meds ordered this encounter  Medications   metoprolol succinate (TOPROL-XL) 50 MG 24 hr tablet    Sig: Take 1 tablet (50 mg total) by mouth daily. Take with or immediately following a meal.    Dispense:  90 tablet    Refill:  3    06/29/22 dose increased to 50 mg qd    Patient Instructions  Medication Instructions:  INCREASE Metoprolol to 50 mg daily  Labwork: None today  Testing/Procedures: Your physician has requested that you have an echocardiogram. Echocardiography is a painless test that uses sound waves to create images of your heart. It provides your doctor with information about the size and shape of your heart and how well your heart's chambers and valves are working. This procedure takes approximately one hour. There are no restrictions for this procedure. Please do NOT wear cologne, perfume, aftershave, or lotions (deodorant is allowed). Please arrive 15 minutes prior to your appointment time.   Follow-Up: 6 weeks  Any Other Special Instructions Will Be Listed Below (If Applicable).  If you need a refill on your cardiac medications before your next appointment, please call your pharmacy.    SignedSharlene Dory, NP  06/30/2022 9:33 AM    [facility]

## 2022-07-06 ENCOUNTER — Ambulatory Visit: Payer: Self-pay | Admitting: *Deleted

## 2022-07-06 ENCOUNTER — Encounter: Payer: Self-pay | Admitting: *Deleted

## 2022-07-09 NOTE — Patient Outreach (Signed)
  Care Coordination   Follow Up Visit Note   07/06/2022 Name: [name] MRN: [id number] DOB: 28-Jan-1971  [name] is a 51 y.o. year old female who sees [place], [name], [place] for primary care. I spoke with  [name] by phone today.  What matters to the patients health and wellness today?  Phantom limb pain and managing blood sugar    Goals Addressed               This Visit's Progress     Patient Stated     Management of DM, decrease A1C (pt-stated)        Care Coordination Interventions: Provided education to patient about basic DM disease process Reviewed medications with patient and discussed importance of medication adherence Referral made to community resources care guide team for assistance with dental resources for multiple broken or loose teeth.  Resource information received. Assessed social determinant of health barriers Discussed upcoming appt with Steve Rattler, PharmD on 12/15 to discuss how how to use ozympic. Metformin has been d/c and they are switching Basaglar to Lantus due to insurance coverage Provided with [facility] contact number and encouraged to reach out as needed       Other     Phantom Limb Pain Management   Not on track     Care Coordination Interventions: Reviewed provider established plan for pain management Discussed importance of adherence to all scheduled medical appointments Counseled on the importance of reporting any/all new or changed pain symptoms or management strategies to pain management provider Advised patient to report to care team affect of pain on daily activities Discussed use of relaxation techniques and/or diversional activities to assist with pain reduction (distraction, imagery, relaxation, massage, acupressure, TENS, heat, and cold application Reviewed with patient prescribed pharmacological and nonpharmacological pain relief strategies Advised patient to discuss if ketamine would be a good treatment option  for her phantom pain with provider Assessed social determinant of health barriers Discussed Bil AKA. Most recent was in 09/2021. Healed up nicely. Still a little tight.  Discussed mobility. Using a wheelchair but would like a motorized wheelchair. Application denied. Working with PCP on this and Danford Bad, LCSW [phone]) has been consulted to see if there is any assistance she can offer She has some oxycodone left from her surgery that she takes if she absolutely needs to. It helps some. She has a limited supply so tries not to use it. Neurontin doesn't help. Discussed that Pain Management evaluation may be appropriate  Therapeutic listening utilized regarding pain and effect on daily life. Has difficulty sleeping because of it. Encouraged to talk with LCSW regarding depression and non-pharmacological ways to manage pain Encouraged to reach out to [facility] as needed        SDOH assessments and interventions completed:  Yes  SDOH Interventions Today    Flowsheet Row Most Recent Value  SDOH Interventions   Housing Interventions Intervention Not Indicated  Transportation Interventions Intervention Not Indicated  Physical Activity Interventions Patient Refused  [bilaeral BKA]        Care Coordination Interventions:  Yes, provided   Follow up plan: Follow up call scheduled for 08/24/22     Encounter Outcome:  Pt. Visit Completed   (FYI: This is a late entry for 07/06/22)  Demetrios Loll, BSN, RN-BC RN Care Coordinator [facility]  [facility] Direct Dial: [phone] Main #: [phone]

## 2022-07-13 ENCOUNTER — Ambulatory Visit: Payer: Self-pay | Admitting: *Deleted

## 2022-07-13 ENCOUNTER — Encounter: Payer: Self-pay | Admitting: *Deleted

## 2022-07-13 NOTE — Patient Instructions (Signed)
Visit Information  Thank you for taking time to visit with me today. Please don't hesitate to contact me if I can be of assistance to you.   Following are the goals we discussed today:   Goals Addressed               This Visit's Progress     Reduce and Manage My Symptoms of Anxiety and Depression. (pt-stated)   On track     Care Coordination Interventions:  Active Listening Utilized.  Caregiver Stress Acknowledged. Caregiver Resources Reviewed. Caregiver and Emotional Support Provided. Solution-Focused Strategies Employed. Cognitive Behavioral Therapy Initiated. Due to Lack of Reliable Transportation and Inability to [employer] Per Visit, You Are Agreeable to Continuing to Receive Ongoing Counseling and Supportive Services through CSW with [facility] (# [phone]). CSW Collaboration with Receptionist at [facility] [phone]), to Inquire About Referral to Pain Management Clinic, for Phantom Limb Pain, Recommended by Primary Care Provider, Wayland Denis. ~ Receptionist at [facility] [phone]# [phone]), Agreed to Follow-Up with Patient and CSW, As Soon As Answer is Received from Primary Care Provider, Wayland Denis, Regarding Referral to Pain Management Clinic. CSW Collaboration with Receptionist at [employer] (# [phone]), to Inquire About Referral for Phantom Limb Pain Management. ~ Receptionist at [facility] (# [phone]), Denied Receipt of Referral for Patient. Continue Appeal Process through Medicare/Medicaid, to Try and Obtain a Chiropodist. ~ Approval/Denial Letter Should Be Received No Later Than 07/26/2022. Continue Appeal Process through [facility] [phone]), to Try and Obtain More Hours of Personal Care Services Per Month. ~ [facility] Has Approved You for 26 Hours of Personal Care Services Per Month. ~ [facility] Has  Approved [facility] (# [phone]) to Provide Personal Care Services. ~ [facility] is Currently Providing Personal Care Services Monday Thru Friday, From 8:00 AM Until 1:00 PM.   Please Continue Utilizing [facility] [phone]), for Physician Appointments and Provider Practice Visits. Effective 07/27/2021, You May Begin Utilizing [employer] (# [phone]), a Benefit to [employer].          Our next appointment is by telephone on 07/28/2022 at 11:15 am.  Please call the care guide team at [phone] if you need to cancel or reschedule your appointment.   If you are experiencing a Mental Health or Behavioral Health Crisis or need someone to talk to, please call the Suicide and Crisis Lifeline: 988 call the [place] National Suicide Prevention Lifeline: (209)095-9319 or TTY: 705 485 1581 TTY 3642436580) to talk to a trained counselor call 1-800-273-TALK (toll free, 24 hour hotline) go to [facility] [address], [place] [phone]) call the [facility] Crisis Line: ([phone] call 911  Patient verbalizes understanding of instructions and care plan provided today and agrees to view in MyChart. Active MyChart status and patient understanding of how to access instructions and care plan via MyChart confirmed with patient.     Telephone follow up appointment with care management team member scheduled for:  07/28/2022 at 11:15 am.  Danford Bad, BSW, MSW, LCSW  Licensed Clinical Social Worker  [facility]  Mailing [place]. [address], [place], [name] [address] Physical Address-[address] [place], [name] [address] Toll Free Main # [phone] Fax # [phone] Cell # [phone] [email or web address]

## 2022-07-13 NOTE — Patient Outreach (Signed)
  Care Coordination   Follow Up Visit Note   07/13/2022  Name: [name] MRN: [id number] DOB: [birth year]  [name] is a 51 y.o. year old female who sees [place], [name], [place] for primary care. I spoke with [name] by phone today.  What matters to the patients health and wellness today?   Reduce and Manage My Symptoms of Anxiety and Depression.   Goals Addressed               This Visit's Progress     Reduce and Manage My Symptoms of Anxiety and Depression. (pt-stated)   On track     Care Coordination Interventions:  Active Listening Utilized.  Caregiver Stress Acknowledged. Caregiver Resources Reviewed. Caregiver and Emotional Support Provided. Solution-Focused Strategies Employed. Cognitive Behavioral Therapy Initiated. Due to Lack of Reliable Transportation and Inability to [employer] Per Visit, You Are Agreeable to Continuing to Receive Ongoing Counseling and Supportive Services through CSW with [facility] (# [phone]). CSW Collaboration with Receptionist at [facility] [phone]), to Inquire About Referral to Pain Management Clinic, for Phantom Limb Pain, Recommended by Primary Care Provider, Wayland Denis. ~ Receptionist at [facility] [phone]# [phone]), Agreed to Follow-Up with Patient and CSW, As Soon As Answer is Received from Primary Care Provider, Wayland Denis, Regarding Referral to Pain Management Clinic. CSW Collaboration with Receptionist at [employer] (# [phone]), to Inquire About Referral for Phantom Limb Pain Management. ~ Receptionist at [facility] (# [phone]), Denied Receipt of Referral for Patient. Continue Appeal Process through Medicare/Medicaid, to Try and Obtain a Chiropodist. ~ Approval/Denial Letter Should Be Received No Later Than 07/26/2022. Continue Appeal Process through [facility] [phone]), to Try and Obtain More Hours of Personal Care Services Per Month. ~ [facility] Has Approved You for 26 Hours of Personal Care Services Per Month. ~ [facility] Has Approved [facility] (# [phone]) to Provide Personal Care Services. ~ [facility] is Currently Providing Personal Care Services Monday Thru Friday, From 8:00 AM Until 1:00 PM.   Please Continue Utilizing [employer] [phone]), for Physician Appointments and Provider Practice Visits. Effective 07/27/2021, You May Begin Utilizing [employer] (# [phone]), a Benefit to [employer].          SDOH assessments and interventions completed:  Yes.  Care Coordination Interventions:  Yes, provided.   Follow up plan: Follow up call scheduled for 07/28/2022 at 11:15 am.  Encounter Outcome:  Pt. Visit Completed.   Danford Bad, BSW, MSW, LCSW  Licensed Restaurant manager, fast food Health System  Mailing [place] [place], [name] [address] Physical Address-[address] [place], [name] [address] Toll Free Main # [phone] Fax # [phone] Cell # [phone] [email or web address]

## 2022-07-16 ENCOUNTER — Ambulatory Visit: Payer: Medicare Other | Attending: Nurse Practitioner

## 2022-07-16 DIAGNOSIS — I5022 Chronic systolic (congestive) heart failure: Secondary | ICD-10-CM | POA: Diagnosis not present

## 2022-07-16 LAB — ECHOCARDIOGRAM COMPLETE
AV Mean grad: 6 mmHg
AV Peak grad: 13.8 mmHg
AV Vena cont: 0.3 cm
Ao pk vel: 1.86 m/s
Area-P 1/2: 2.91 cm2
Calc EF: 53.8 %
MV M vel: 4.24 m/s
MV Peak grad: 71.8 mmHg
P 1/2 time: 766 msec
S' Lateral: 3.7 cm
Single Plane A2C EF: 53 %
Single Plane A4C EF: 51.8 %

## 2022-07-24 DIAGNOSIS — E559 Vitamin D deficiency, unspecified: Secondary | ICD-10-CM | POA: Diagnosis not present

## 2022-07-24 DIAGNOSIS — E1140 Type 2 diabetes mellitus with diabetic neuropathy, unspecified: Secondary | ICD-10-CM | POA: Diagnosis not present

## 2022-07-24 DIAGNOSIS — N183 Chronic kidney disease, stage 3 unspecified: Secondary | ICD-10-CM | POA: Diagnosis not present

## 2022-07-24 DIAGNOSIS — Z1329 Encounter for screening for other suspected endocrine disorder: Secondary | ICD-10-CM | POA: Diagnosis not present

## 2022-07-24 DIAGNOSIS — E538 Deficiency of other specified B group vitamins: Secondary | ICD-10-CM | POA: Diagnosis not present

## 2022-07-24 DIAGNOSIS — E7849 Other hyperlipidemia: Secondary | ICD-10-CM | POA: Diagnosis not present

## 2022-07-24 DIAGNOSIS — R7989 Other specified abnormal findings of blood chemistry: Secondary | ICD-10-CM | POA: Diagnosis not present

## 2022-07-24 DIAGNOSIS — E782 Mixed hyperlipidemia: Secondary | ICD-10-CM | POA: Diagnosis not present

## 2022-07-24 DIAGNOSIS — I10 Essential (primary) hypertension: Secondary | ICD-10-CM | POA: Diagnosis not present

## 2022-07-24 DIAGNOSIS — F17200 Nicotine dependence, unspecified, uncomplicated: Secondary | ICD-10-CM | POA: Diagnosis not present

## 2022-07-24 DIAGNOSIS — I2699 Other pulmonary embolism without acute cor pulmonale: Secondary | ICD-10-CM | POA: Diagnosis not present

## 2022-07-28 ENCOUNTER — Encounter: Payer: Self-pay | Admitting: *Deleted

## 2022-07-28 ENCOUNTER — Ambulatory Visit: Payer: Self-pay | Admitting: *Deleted

## 2022-07-28 NOTE — Patient Outreach (Signed)
Care Coordination   Follow Up Visit Note   07/28/2022  Name: CARY WILFORD MRN: [id number] DOB: 19-Mar-1971  [name] is a 52 y.o. year old female who sees [place], Aundra Millet, [place] for primary care. I spoke with Estell Harpin by phone today.  What matters to the patients health and wellness today?  Reduce and Manage My Symptoms of Anxiety and Depression.    Goals Addressed               This Visit's Progress     Reduce and Manage My Symptoms of Anxiety and Depression. (pt-stated)   On track     Care Coordination Interventions:  Active Listening/Reflection Utilized.  Verbalization of Feelings Encouraged. Caregiver Stress Acknowledged. Emotional & Caregiver Support Provided. Caregiver Resources Reviewed & Self Enrollment in Caregiver Support Group Emphasized. Solution-Focused Strategies Activated. Problem-Solving Interventions Developed. Client-Centered Therapy Performed. Brief Cognitive Behavioral Therapy Initiated. Continue to Receive Ongoing Counseling & Supportive Services through CSW, with [facility] [phone]). CSW Collaboration with Receptionist at [facility] [phone]), to [place] About Referral to Pain Management Clinic for Phantom Limb Pain, Recommended by Primary Care Provider, Clemmie Krill. ~ On 07/13/2022 - Receptionist at [facility] (# [phone]), Agreed to Follow-Up with Patient & CSW, As Soon As Answer is Received from Primary Care Provider, Clemmie Krill, Regarding Referral to Pain Management Clinic.  ~ On 07/28/2022 - Receptionist at [facility] (# [phone]), Agreed to Follow-Up with Patient & CSW, As Soon As Answer is Received from Primary Care Provider, Clemmie Krill, Regarding Referral to Pain Management Clinic.  CSW Collaboration with Receptionist at [employer] (# [phone]), to Inquire About Referral for Phantom Limb Pain Management. ~  Receptionist at [facility] (# [phone]), Denied Receipt of Referral for Patient on 07/13/2022 & 07/28/2022. Continue Appeal Process through Medicare/Medicaid, to Try and Obtain a Counsellor. ~ Appeal Process is Still Pending, As Of 07/28/2022. ~ Approval/Denial Letter Will Be Mailed Directly to Your Home. CSW Collaboration with Receptionist at [facility] [phone]), to Inquire About Re-Evaluation Paperwork to Request Additional Hours of [place] Per Month. Continue Appeal Process through [facility] [phone]), to Try and Obtain More Hours of [place] Per Month. ~ [facility] Decreased [place] Hours from 30 to 26 Per Month, Despite Recent Amputation. ~ Assistance Required with Bathing, Dressing, Meal Preparation, Grocery Shopping, Light Housekeeping Duties, Medical sales representative, Medication Management & [place] Vehicle. ~ [facility] Has Approved You for 26 Hours of [place] Per Month, But Trying to Increase Number of Hours Provided. ~ [facility] Has Approved [facility] (# [phone]) to Provide [place]. ~ [place] is Currently Providing Personal Care Services Monday Thru Friday, From 8:00 AM Until 1:00 PM.   Please Continue Utilizing [employer] [phone]), for Physician Appointments & Provider Practice Visits. Please Begin Utilizing [employer] (# [phone]), a Benefit to [employer]. Keep Follow-Up Appointment with Primary Care Provider, Physician Assistant, Clemmie Krill, Scheduled on 07/31/2022 at 9:30 AM.          SDOH assessments and interventions completed:  Yes.  Care Coordination Interventions:  Yes, provided.   Follow up plan: Follow up call scheduled for 08/11/2022 at 12:30  pm.  Encounter Outcome:  Pt. Visit Completed.   Nat Christen, BSW, MSW, LCSW  Licensed Producer, television/film/video  [facility]  Mailing Address-[address], [place], [place] [address] Physical Address-[address] [place], [place] [address] Toll Free Main # [phone] Fax # [phone] Cell # [phone] [email or web address]

## 2022-07-28 NOTE — Patient Instructions (Signed)
Visit Information  Thank you for taking time to visit with me today. Please don't hesitate to contact me if I can be of assistance to you.   Following are the goals we discussed today:   Goals Addressed               This Visit's Progress     Reduce and Manage My Symptoms of Anxiety and Depression. (pt-stated)   On track     Care Coordination Interventions:  Active Listening/Reflection Utilized.  Verbalization of Feelings Encouraged. Caregiver Stress Acknowledged. Emotional & Caregiver Support Provided. Caregiver Resources Reviewed & Self Enrollment in Caregiver Support Group Emphasized. Solution-Focused Strategies Activated. Problem-Solving Interventions Developed. Client-Centered Therapy Performed. Brief Cognitive Behavioral Therapy Initiated. Continue to Receive Ongoing Counseling & Supportive Services through CSW, with [place] [phone]). CSW Collaboration with Receptionist at [facility] [phone]), to [place] About Referral to Pain Management Clinic for Phantom Limb Pain, Recommended by Primary Care Provider, Clemmie Krill. ~ On 07/13/2022 - Receptionist at [facility] (# [phone]), Agreed to Follow-Up with Patient & CSW, As Soon As Answer is Received from Primary Care Provider, Clemmie Krill, Regarding Referral to Pain Management Clinic.  ~ On 07/28/2022 - Receptionist at [employer] (# [phone]), Agreed to Follow-Up with Patient & CSW, As Soon As Answer is Received from Primary Care Provider, Clemmie Krill, Regarding Referral to Pain Management Clinic.  CSW Collaboration with Receptionist at [employer] (# [phone]), to Inquire About Referral for Phantom Limb Pain Management. ~ Receptionist at [facility] (# [phone]), Denied Receipt of Referral for Patient on 07/13/2022 & 07/28/2022. Continue Appeal Process through Medicare/Medicaid, to Try and  Obtain a Counsellor. ~ Appeal Process is Still Pending, As Of 07/28/2022. ~ Approval/Denial Letter Will Be Mailed Directly to Your Home. CSW Collaboration with Receptionist at [facility] [phone]), to Inquire About Re-Evaluation Paperwork to Request Additional Hours of [place] Per Month. Continue Appeal Process through [facility] [phone]), to Try and Obtain More Hours of [place] Per Month. ~ [facility] Decreased [place] Hours from 30 to 26 Per Month, Despite Recent Amputation. ~ Assistance Required with Bathing, Dressing, Meal Preparation, Grocery Shopping, Light Housekeeping Duties, Medical sales representative, Medication Management & [place] Vehicle. ~ [facility] Has Approved You for 26 Hours of [place] Per Month, But Trying to Increase Number of Hours Provided. ~ [facility] Has Approved [facility] (# [phone]) to Provide [place]. ~ [place] is Currently Providing Personal Care Services Monday Thru Friday, From 8:00 AM Until 1:00 PM.   Please Continue Utilizing [employer] [phone]), for Physician Appointments & Provider Practice Visits. Please Begin Utilizing [employer] (# [phone]), a Benefit to [employer]. Keep Follow-Up Appointment with Primary Care Provider, Physician Assistant, Clemmie Krill, Scheduled on 07/31/2022 at 9:30 AM.          Our next appointment is by telephone on 08/11/2022 at 12:30 pm.  Please call the care guide team at [phone] if you need to cancel or reschedule your appointment.   If you are experiencing a Mental Health or [place] or need someone to talk to, please call the Suicide and Crisis Lifeline: 988 call the [place] National Suicide Prevention Lifeline:  (262)005-5000 or TTY: (218) 582-9890 TTY (229) 163-5270) to talk to a trained counselor call 1-800-273-TALK (toll free, 24 hour hotline) go to [facility] [phone]) call the [place]: 775-872-9314 call 911  Patient verbalizes understanding of instructions and care plan provided today and agrees to view in [place]. Active MyChart status and patient understanding of how to access instructions and care plan via MyChart confirmed with patient.       Telephone follow up appointment with care management team member scheduled for:  08/11/2022 at 12:30 pm.    Nat Christen, BSW, MSW, [place]  Licensed Clinical Social Worker  [place]  Mailing [place]. [address] [place], [place] [address] Physical Address-[address], [place], [place] [address] Toll Free Main # [phone] Fax # [phone] Cell # [phone] [email or web address]

## 2022-08-11 ENCOUNTER — Encounter: Payer: Self-pay | Admitting: *Deleted

## 2022-08-11 ENCOUNTER — Ambulatory Visit: Payer: Self-pay | Admitting: *Deleted

## 2022-08-11 ENCOUNTER — Encounter: Payer: Self-pay | Admitting: Nurse Practitioner

## 2022-08-11 ENCOUNTER — Telehealth: Payer: Self-pay | Admitting: Nurse Practitioner

## 2022-08-11 ENCOUNTER — Ambulatory Visit: Payer: 59 | Attending: Nurse Practitioner | Admitting: Nurse Practitioner

## 2022-08-11 VITALS — BP 110/60 | HR 49

## 2022-08-11 DIAGNOSIS — R079 Chest pain, unspecified: Secondary | ICD-10-CM

## 2022-08-11 DIAGNOSIS — Z79899 Other long term (current) drug therapy: Secondary | ICD-10-CM

## 2022-08-11 DIAGNOSIS — I10 Essential (primary) hypertension: Secondary | ICD-10-CM

## 2022-08-11 DIAGNOSIS — R0989 Other specified symptoms and signs involving the circulatory and respiratory systems: Secondary | ICD-10-CM

## 2022-08-11 DIAGNOSIS — E785 Hyperlipidemia, unspecified: Secondary | ICD-10-CM

## 2022-08-11 DIAGNOSIS — R5382 Chronic fatigue, unspecified: Secondary | ICD-10-CM

## 2022-08-11 DIAGNOSIS — I493 Ventricular premature depolarization: Secondary | ICD-10-CM

## 2022-08-11 DIAGNOSIS — I5022 Chronic systolic (congestive) heart failure: Secondary | ICD-10-CM

## 2022-08-11 DIAGNOSIS — R42 Dizziness and giddiness: Secondary | ICD-10-CM

## 2022-08-11 DIAGNOSIS — Z720 Tobacco use: Secondary | ICD-10-CM

## 2022-08-11 DIAGNOSIS — I739 Peripheral vascular disease, unspecified: Secondary | ICD-10-CM

## 2022-08-11 DIAGNOSIS — R002 Palpitations: Secondary | ICD-10-CM | POA: Diagnosis not present

## 2022-08-11 DIAGNOSIS — I5020 Unspecified systolic (congestive) heart failure: Secondary | ICD-10-CM

## 2022-08-11 MED ORDER — METOPROLOL SUCCINATE ER 25 MG PO TB24: 25.0000 mg | ORAL_TABLET | Freq: Every day | ORAL | 3 refills | Status: DC

## 2022-08-11 NOTE — Progress Notes (Signed)
Cardiology Office Note:    Date:  08/11/2022   ID:  PALESTINE MOSCO, DOB 17-Dec-1970, MRN [id number]  PCP:  Skillman, Katherine E, [facility] Cardiologist:  Rozann Lesches, MD     Referring MD: Rosalee Kaufman, *   CC: Here for follow-up   History of Present Illness:    YENTL VERGE is a 52 y.o. female with a hx of the following:  HFmrEF Frequent PVC's, hx of palpitations HTN HLD PAD, s/p bilateral AKA Asthma CKD stage 3 T2DM Tobacco abuse Pericardial effusion  Patient is a 52 year old female with past medical history as mentioned above.  She has a history of following vascular surgery for history of early onset of peripheral vascular occlusive disease, history of multiple bilateral procedures in [place]. She is now s/p bilateral AKA and continues to use tobacco.   Was referred to cardiology services in July 2023 and saw Dr. Domenic Polite for the first time on January 29, 2022.  Chief complaint of palpitations and documented PVCs.  She reported intermittent rapid heart rates and sometimes irregular heartbeats, she became concerned when she saw her heart rate in the 30s by pulse ox.  EKG revealed sinus rhythm, frequent PVCs and PACs.  Echocardiogram in 2013 revealed EF 65 to 70%.  72 hour Zio patch revealed sinus rhythm, rare PACs, frequent PVCs, multiple brief bursts of NSVT, no pauses or other sustained ventricular arrhythmias.  Updated echocardiogram arranged by Dr. Domenic Polite revealed EF reduced at 40%, small pericardial effusion noted that was circumferential, without evidence of cardiac tamponade, mild to moderate MR, mild calcification of aortic valve, mild AR.  Dr. Domenic Polite recommended to switch carvedilol to Toprol XL 25 mg daily and to continue Jardiance.   I saw her for follow-up evaluation on 06/29/2022. Noted occasional palpitations at night that were brief. Denied any CP, SHOB, syncope, presyncope, dizziness, orthopnea, PND, swelling significant  weight changes, or claudication. Toprol was increased. Updated Echo revealed improved EF to 50-55%, no RWMA, grade 1 DD, mild MR, mild to moderate AR, no other significant valvular abnormalities. Was told to follow-up in 6 weeks.   Today she presents for follow-up. She presents with multiple concerns. Patient is a difficult historian. She admits to MSK soreness from pushing wheelchair, recently got approved for a motorized wheelchair. Does admit to CP at times, intermittent and chronic per her report, thinks her anxiety is contributing. Located along lower chest/epigastric area, mid with radiation to shoulder at times. Denies any alleviating/aggravating factors. Has not tried any medications. Sometimes has shortness of breath attributed to this, says it feels like an anxiety attack. CC today is fatigue and poor appetite, sometimes feels lightheaded. Notes occasional palpitations. Denies any syncope, presyncope, orthopnea, PND, swelling, acute bleeding, or claudication.    Past Medical History:  Diagnosis Date   Anemia 02/05/2012   Anxiety    Arthritis    Asthma    CKD (chronic kidney disease) stage 3, GFR 30-59 ml/min (HCC) 09/25/2021   COPD (chronic obstructive pulmonary disease) (HCC)    Depression    Essential hypertension    Fatty liver    GERD (gastroesophageal reflux disease)    Headache(784.0)    PAD (peripheral artery disease) (HCC)    Status post bilateral AKA   PE (pulmonary embolism)    Xarelto   Pneumonia 2013   Sepsis ([place])    Type 2 diabetes mellitus ([place])     Past Surgical History:  Procedure Laterality Date   ABDOMINAL  AORTOGRAM W/LOWER EXTREMITY N/A 09/05/2021   Procedure: ABDOMINAL AORTOGRAM W/LOWER EXTREMITY;  Surgeon: [name], MD;  Location: [facility];  Service: Cardiovascular;  Laterality: N/A;   AMPUTATION Right 09/24/2021   Procedure: RIGHT ABOVE KNEE AMPUTATION;  Surgeon: [name], MD;  Location: [facility];  Service: Vascular;  Laterality:  Right;   APPENDECTOMY     CHOLECYSTECTOMY     DIAGNOSTIC LAPAROSCOPY     ESOPHAGEAL DILATION N/A 10/09/2014   Procedure: ESOPHAGEAL DILATION;  Surgeon: West Bali, MD;  Location: [facility]S;  Service: Endoscopy;  Laterality: N/A;  #15 and #16 savory   ESOPHAGOGASTRODUODENOSCOPY (EGD) WITH PROPOFOL N/A 10/09/2014   Dr. Darrick Penna: mild erosive gastritis, Savary dilation. Nausea and vomiting most likely due to uncontroleld blood sugars, reflux, and gastritis.    Current Medications: Current Meds  Medication Sig   ALPRAZolam (XANAX) 0.5 MG tablet Take 1 tablet (0.5 mg total) by mouth 3 (three) times daily as needed for anxiety.   aspirin EC 81 MG tablet Take 81 mg by mouth daily. Swallow whole.   atorvastatin (LIPITOR) 80 MG tablet SMARTSIG:1 Tablet(s) By Mouth Every Evening   Cholecalciferol (VITAMIN D3 PO) Take 1 tablet by mouth daily.   clopidogrel (PLAVIX) 75 MG tablet Take 75 mg by mouth daily.   docusate sodium (COLACE) 100 MG capsule Take 100 mg by mouth as needed.   escitalopram (LEXAPRO) 20 MG tablet Take 20 mg by mouth daily.   gabapentin (NEURONTIN) 400 MG capsule Take 400 mg by mouth 4 (four) times daily.   JARDIANCE 25 MG TABS tablet Take 25 mg by mouth daily.   linaclotide (LINZESS) 72 MCG capsule Take 72 mcg by mouth as needed.   methocarbamol (ROBAXIN) 500 MG tablet Take 500 mg by mouth 2 (two) times daily.   ondansetron (ZOFRAN) 4 MG tablet Take 4 mg by mouth every 6 (six) hours as needed for nausea or vomiting.   oxyCODONE-acetaminophen (PERCOCET) 7.5-325 MG tablet Take 1 tablet by mouth as needed for severe pain.   oxyCODONE-acetaminophen (PERCOCET/ROXICET) 5-325 MG tablet Take 1 tablet by mouth every 4 (four) hours as needed for severe pain.   OZEMPIC, 0.25 OR 0.5 MG/DOSE, 2 MG/3ML SOPN Inject 0.5 mg into the skin once a week.   pantoprazole (PROTONIX) 40 MG tablet Take 1 tablet (40 mg total) by mouth daily.   sacubitril-valsartan (ENTRESTO) 24-26 MG Take 1 tablet by mouth 2  (two) times daily. 30 day free voucher will be faxed   topiramate (TOPAMAX) 50 MG tablet Take 50 mg by mouth 2 (two) times daily.   vitamin B-12 1000 MCG tablet Take 1 tablet (1,000 mcg total) by mouth daily.   metoprolol succinate (TOPROL-XL) 50 MG 24 hr tablet Take 1 tablet (50 mg total) by mouth daily. Take with or immediately following a meal.    Allergies:   Patient has no known allergies.   Social History   Socioeconomic History   Marital status: Single    Spouse name: Not on file   Number of children: Not on file   Years of education: 12   Highest education level: 12th grade  Occupational History   Not on file  Tobacco Use   Smoking status: Every Day    Packs/day: 1.00    Years: 20.00    Total pack years: 20.00    Types: Cigarettes    Passive exposure: Current   Smokeless tobacco: Never  Vaping Use   Vaping Use: Former  Substance and  Sexual Activity   Alcohol use: No   Drug use: No   Sexual activity: Yes    Birth control/protection: Injection  Other Topics Concern   Not on file  Social History Narrative   Not on file   Social Determinants of Health   Financial Resource Strain: Low Risk  (06/15/2022)   Overall Financial Resource Strain (CARDIA)    Difficulty of Paying Living Expenses: Not hard at all  Food Insecurity: No Food Insecurity (06/15/2022)   Hunger Vital Sign    Worried About Running Out of Food in the Last Year: Never true    Ran Out of Food in the Last Year: Never true  Transportation Needs: No Transportation Needs (07/06/2022)   PRAPARE - Administrator, Civil ServiceTransportation    Lack of Transportation (Medical): No    Lack of Transportation (Non-Medical): No  Physical Activity: Inactive (07/09/2022)   Exercise Vital Sign    Days of Exercise per Week: 0 days    Minutes of Exercise per Session: 0 min  Stress: Stress Concern Present (03/18/2022)   [employer] - Occupational Stress Questionnaire    Feeling of Stress : Rather much  Social  Connections: Unknown (03/18/2022)   Social Connection and Isolation Panel [NHANES]    Frequency of Communication with Friends and Family: More than three times a week    Frequency of Social Gatherings with Friends and Family: More than three times a week    Attends Religious Services: More than 4 times per year    Active Member of [employer]ubs or Organizations: No    Attends BankerClub or Organization Meetings: Never    Marital Status: Not on file     Family History: The patient's family history includes Breast cancer in her mother; Diabetes in her brother and sister; Kidney disease in her sister. There is no history of Colon cancer or Liver disease.  ROS:   Review of Systems  Constitutional:  Positive for malaise/fatigue. Negative for chills, diaphoresis, fever and weight loss.  HENT: Negative.    Eyes: Negative.   Respiratory: Negative.    Cardiovascular:  Positive for chest pain and palpitations. Negative for orthopnea, claudication, leg swelling and PND.  Gastrointestinal: Negative.   Genitourinary: Negative.   Musculoskeletal: Negative.   Skin: Negative.   Neurological: Negative.  Negative for dizziness.       Occasionally lightheaded - see HPI.   Endo/Heme/Allergies: Negative.   Psychiatric/Behavioral:  Negative for depression, hallucinations, memory loss, substance abuse and suicidal ideas. The patient is nervous/anxious and has insomnia.     Please see the history of present illness.    All other systems reviewed and are negative.  EKGs/Labs/Other Studies Reviewed:    The following studies were reviewed today:   EKG:  EKG is ordered today.  The ekg ordered today demonstrates NSR, 82 bpm, with frequent PVC's in a pattern of bigeminy, incomplete RBBB, LPFB, with nonspecific ST/T wave changes, otherwise no acute ischemic changes.   Echocardiogram on 07/16/2022:  1. Left ventricular ejection fraction, by estimation, is 50 to 55%. The  left ventricle has low normal function. The left  ventricle has no regional  wall motion abnormalities. Left ventricular diastolic parameters are  consistent with Grade I diastolic  dysfunction (impaired relaxation).   2. Right ventricular systolic function is normal. The right ventricular  size is normal. Tricuspid regurgitation signal is inadequate for assessing  PA pressure.   3. The mitral valve is abnormal. Mild mitral valve regurgitation. No  evidence of mitral  stenosis.   4. The aortic valve has an indeterminant number of cusps. There is mild  calcification of the aortic valve. There is mild thickening of the aortic  valve. Aortic valve regurgitation is mild to moderate. No aortic stenosis  is present.   5. The inferior vena cava is normal in size with greater than 50%  respiratory variability, suggesting right atrial pressure of 3 mmHg.   Comparison(s): Echocardiogram done 02/03/22 showed an EF of 40%.  72 hour Zio monitor on 02/16/22: Predominant rhythm is sinus with heart rate ranging from 83 bpm up to 116 bpm and average heart rate 94 bpm. There were rare PACs representing less than 1% total beats. There were frequent PVCs representing approximately 14% total beats, also frequent ventricular couplets and triplets with limited episodes of ventricular bigeminy and trigeminy.  Multiple, brief episodes of NSVT were noted, the longest of which was 7 beats.  There were no sustained ventricular arrhythmias. No pauses.  2D Echo on 02/03/22:  1. Left ventricular ejection fraction, by estimation, is 40%. The left  ventricle has mildly decreased function. The left ventricle demonstrates  global hypokinesis. Left ventricular diastolic parameters are  indeterminate. The average left ventricular  global longitudinal strain is -12.4 %. The global longitudinal strain is  abnormal.   2. Right ventricular systolic function is normal. The right ventricular  size is normal. Tricuspid regurgitation signal is inadequate for assessing  PA  pressure.   3. A small pericardial effusion is present. The pericardial effusion is  circumferential. There is no evidence of cardiac tamponade.   4. The mitral valve is abnormal. Mild to moderate mitral valve  regurgitation. No evidence of mitral stenosis.   5. The aortic valve is tricuspid. There is mild calcification of the  aortic valve. There is mild thickening of the aortic valve. Aortic valve  regurgitation is mild.   6. The inferior vena cava is normal in size with greater than 50%  respiratory variability, suggesting right atrial pressure of 3 mmHg.   Comparison(s): Echocardiogram done 01/28/12 showed an EF of 65-70%.  Recent Labs: 09/24/2021: ALT 9 09/27/2021: Magnesium 2.4 10/02/2021: BUN 26; Creatinine, Ser 1.50; Hemoglobin 7.4; Platelets 318; Potassium 5.0; Sodium 126  Recent Lipid Panel No results found for: "CHOL", "TRIG", "HDL", "CHOLHDL", "VLDL", "LDLCALC", "LDLDIRECT"  Physical Exam:    VS:  BP 110/60   Pulse (!) 49   SpO2 95%     Wt Readings from Last 3 Encounters:  10/15/21 191 lb (86.6 kg)  09/24/21 190 lb (86.2 kg)  09/05/21 185 lb (83.9 kg)     GEN: Well nourished, well developed in no acute distress HEENT: Normal NECK: No JVD; Carotid bruit along left neck, no carotid bruit along right neck.  CARDIAC: S1/S2, regular rate with abnormal rhythm due to early beats noted, no murmurs, rubs, gallops; 2+ peripheral pulses throughout, strong and equal bilaterally RESPIRATORY:  Clear and diminished to auscultation without rales, wheezing or rhonchi  MUSCULOSKELETAL:  No edema; Bilateral AKA, wheelchair bound, otherwise normal SKIN: Warm and dry NEUROLOGIC:  Alert and oriented x 3 PSYCHIATRIC:  Normal affect   ASSESSMENT:    1. Chest pain, unspecified type   2. Heart failure with improved ejection fraction (HFimpEF) (HCC)   3. Medication management   4. Palpitations   5. Frequent PVCs   6. Hypertension, unspecified type   7. PAD (peripheral artery disease) (HCC)    8. Bruit of left carotid artery   9. Hyperlipidemia, unspecified hyperlipidemia type  10. Tobacco abuse   11. Lightheadedness   12. Chronic fatigue    PLAN:    In order of problems listed above:   Chest pain of uncertain etiology  Etiology uncertain. Pt is a poor historian, but does sound atypical in nature. Chronic, stable per her report, seems to be anxiety related, however she denied CP at last offic evisit. She reports she did a treadmill stress test many years ago, has not had recent ischemic testing done. Will arrange Lexiscan stress test. Explained risks and benefits to her and she is agreeable to proceed.   Shared Decision Making/Informed Consent The risks [chest pain, shortness of breath, cardiac arrhythmias, dizziness, blood pressure fluctuations, myocardial infarction, stroke/transient ischemic attack, nausea, vomiting, allergic reaction, radiation exposure, metallic taste sensation and life-threatening complications (estimated to be 1 in 10,000)], benefits (risk stratification, diagnosing coronary artery disease, treatment guidance) and alternatives of a nuclear stress test were discussed in detail with Ms. Archibeque and she agrees to proceed.  HFmrEF - > HFimpEF, medication management TTE in July 2023 revealed EF decreased at 40%, global hypokinesis of left ventricle noted. EF improved to 50-55% 06/2022.  Etiology unclear, although could be due to ischemic cardiomyopathy. Euvolemic and well compensated on exam. Continue aspirin, Lipitor, Plavix, Jardiance, Toprol-XL, and Entresto. Will decrease Metoprolol to 25 mg daily for lightheadedness- see below. Low sodium diet, fluid restriction <2L, and daily weights encouraged. Educated to contact our office for weight gain of 2 lbs overnight or 5 lbs in one week. Heart healthy diet encouraged.  Will obtain BMET to see if Sherryll Burger can be titrated up.   Palpitations, frequent PVC's EKG today reveals frequent PVCs.  Does admit to occasional  palpitations at night, lasting  only few minutes in duration.  Will arrange 14 day monitor for evaluation. Decreasing Toprol-XL as mentioned above for lightheadedness. I have encouraged her to cut back on this and to eliminate caffeine.  Continue current medication regimen. Heart healthy diet encouraged. May very well likely benefit from EP referral in future. Will discuss at next follow-up visit after reviewing monitor.   HTN Blood pressure today 110/60. Decreasing Toprol XL for lightheadedness. Discussed to monitor BP at home at least 2 hours after medications and sitting for 5-10 minutes. Given BP log and recommended to obtain an OMRON cuff. Heart healthy diet recommended.   PAD, s/p Bilateral AKA, left carotid bruit Following VVS.  Continue aspirin, Lipitor, and Plavix.  Heart healthy diet recommended.  Continue to follow-up with [facility].  Carotid bruit heard along left sided neck, will arrange carotid Doppler.  HLD Labs 03/2022 revealed total cholesterol 226, HDL 35, LDL 162, and triglycerides 159.  Continue Lipitor.  Heart healthy diet recommended.  6. Tobacco abuse Patient continues to smoke; however she is not interested in quitting smoking at this time.  She politely declined resources that I offered her. Smoking cessation encouraged and discussed.  7. Lightheadedness/Fatigue Etiology multifactorial. Fatigue is chronic. I believe anxiety is contributing to many of her symptoms. BP today is 110/60. Will reduce Toprol XL as mentioned above to see if this helps her symptoms.   8. Disposition: Follow-up with me in 6-8 weeks or sooner if anything changes.   Medication Adjustments/Labs and Tests Ordered: Current medicines are reviewed at length with the patient today.  Concerns regarding medicines are outlined above.  Orders Placed This Encounter  Procedures   NM Myocar Multi W/Spect W/Wall Motion / EF   Basic metabolic panel   LONG TERM MONITOR (3-14 DAYS)  VAS US CAROTID   Meds ordered  this encounter  Medications   metoprolol succinate (TOPROL-XL) 25 MG 24 hr tablet    Sig: Take 1 tablet (25 mg total) by mouth daily. Take with or immediately following a meal.    Dispense:  90 tablet    Refill:  3    Dose change new Rx    Patient Instructions  Medication Instructions:  DECREASE Metoprolol Succinate to 25 mg  *If you need a refill on your cardiac medications before your next appointment, please call your pharmacy*   Lab Work: Your physician recommends that you return for lab work in 6 weeks:  BMP If you have labs (blood work) drawn today and your tests are completely normal, you will receive your results only by: MyChart Message (if you have MyChart) OR A paper copy in the mail If you have any lab test that is abnormal or we need to change your treatment, we will call you to review the results.   Testing/Procedures: Your physician has requested that you have a lexiscan myoview. For further information please visit [email or web address]. Please follow instruction sheet, as given.  Your physician has requested that you have a carotid duplex. This test is an ultrasound of the carotid arteries in your neck. It looks at blood flow through these arteries that supply the brain with blood. Allow one hour for this exam. There are no restrictions or special instructions.  ZIO XT- Long Term Monitor Instructions  Your physician has requested you wear a ZIO patch monitor for 14 days.  This is a single patch monitor. [employer] supplies one patch monitor per enrollment. Additional stickers are not available. Please do not apply patch if you will be having a Nuclear Stress Test,  Echocardiogram, Cardiac CT, MRI, or Chest Xray during the period you would be wearing the  monitor. The patch cannot be worn during these tests. You cannot remove and re-apply the  ZIO XT patch monitor.  Your ZIO patch monitor will be mailed 3 day USPS to your address on file. It may take 3-5 days  to  receive your monitor after you have been enrolled.  Once you have received your monitor, please review the enclosed instructions. Your monitor  has already been registered assigning a specific monitor serial # to you.  Billing and Patient Assistance Program Information  We have supplied [employer] with any of your insurance information on file for billing purposes. [employer] offers a sliding scale Patient Assistance Program for patients that do not have  insurance, or whose insurance does not completely cover the cost of the ZIO monitor.  You must apply for the Patient Assistance Program to qualify for this discounted rate.  To apply, please call [employer] at [phone], select option 4, select option 2, ask to apply for  Patient Assistance Program. Theodore Demark will ask your household income, and how many people  are in your household. They will quote your out-of-pocket cost based on that information.  [employer] will also be able to set up a 62-month, interest-free payment plan if needed.  Applying the monitor   Shave hair from upper left chest.  Hold abrader disc by orange tab. Rub abrader in 40 strokes over the upper left chest as  indicated in your monitor instructions.  Clean area with 4 enclosed alcohol pads. Let dry.  Apply patch as indicated in monitor instructions. Patch will be placed under collarbone on left  side of chest with arrow pointing upward.  Rub patch adhesive  wings for 2 minutes. Remove white label marked "1". Remove the white  label marked "2". Rub patch adhesive wings for 2 additional minutes.  While looking in a mirror, press and release button in center of patch. A small green light will  flash 3-4 times. This will be your only indicator that the monitor has been turned on.  Do not shower for the first 24 hours. You may shower after the first 24 hours.  Press the button if you feel a symptom. You will hear a small click. Record Date, Time and  Symptom in the Patient  Logbook.  When you are ready to remove the patch, follow instructions on the last 2 pages of Patient  Logbook. Stick patch monitor onto the last page of Patient Logbook.  Place Patient Logbook in the blue and white box. Use locking tab on box and tape box closed  securely. The blue and white box has prepaid postage on it. Please place it in the mailbox as  soon as possible. Your physician should have your test results approximately 7 days after the  monitor has been mailed back to [facility].  Call [facility] [employer] Customer Care at [phone] if you have questions regarding  your ZIO XT patch monitor. Call them immediately if you see an orange light blinking on your  monitor.  If your monitor falls off in less than 4 days, contact our Monitor department at [phone].  If your monitor becomes loose or falls off after 4 days call [employer] at [phone] for  suggestions on securing your monitor  Follow-Up: At [facility], you and your health needs are our priority.  As part of our continuing mission to provide you with exceptional heart care, we have created designated Provider Care Teams.  These Care Teams include your primary Cardiologist (physician) and Advanced Practice Providers (APPs -  Physician Assistants and Nurse Practitioners) who all work together to provide you with the care you need, when you need it.  We recommend signing up for the patient portal called "MyChart".  Sign up information is provided on this After Visit Summary.  MyChart is used to connect with patients for Virtual Visits (Telemedicine).  Patients are able to view lab/test results, encounter notes, upcoming appointments, etc.  Non-urgent messages can be sent to your provider as well.   To learn more about what you can do with MyChart, go to [email or web address].    Your next appointment:   6-8 week(s)  Provider:   Sharlene DoryElizabeth Izaha Shughart, NP  Other Instructions     Signed, Sharlene DoryElizabeth Icelyn Navarrete, NP   08/11/2022 12:56 PM    [facility]

## 2022-08-11 NOTE — Patient Outreach (Signed)
  Care Coordination   Follow Up Visit Note   08/11/2022  Name: [name] MRN: [id number] DOB: [birth year]  [name] is a 52 y.o. year old female who sees [place], Aundra Millet, [place] for primary care. I spoke with Estell Harpin by phone today.  What matters to the patients health and wellness today?  Reduce and Manage My Symptoms of Anxiety and Depression.    Goals Addressed               This Visit's Progress     Reduce and Manage My Symptoms of Anxiety and Depression. (pt-stated)   On track     Care Coordination Interventions:  Solution-Focused Strategies Implemented. Problem-Solving Interventions Activated. Task-Centered Activities Employed. Active Listening & Reflection Utilized.  Verbalization of Feelings Encouraged. Emotional Stress Acknowledged. Emotional Support Provided. Client-Centered Therapy Initiated. Cognitive Behavioral Therapy Performed. CSW Collaboration with Primary Care Provider, Dr. Rozann Lesches with [place] (# [phone]), Via Secure Chat Message in [place], to [place] About Referral to Pain Management Clinic for Phantom Limb Pain. ~ Secure Chat Message Via Epic Submitted to Primary Care Provider, Dr. Rozann Lesches with [place] (# [phone]), on 08/11/2022, as CSW Awaits a Response. CSW Collaboration with Nurse Practitioner, Finis Bud with [facility]man at [place], [place] Noss [phone]), Via Secure Chat Message in [place], to [place] About Referral to Pain Management Clinic for Phantom Limb Pain. ~ Secure Chat Message Via Epic Received from Nurse Practitioner, Finis Bud with [facility] at [facility] [phone]), on 08/11/2021, Explaining No Referral Made, as There Are No Pain Management Specialists in [place], [place], Encouraging CSW to Follow-Up with Primary Care Provider, Dr. Rozann Lesches with [facility] (# [phone]). Congratulations on Librarian, academic through Medicare/Medicaid, to [employer] a Counsellor. Condolences on Denial Appeal Process through [employer] [facility] [employer] [phone]), to Try & Obtain More Hours of [place] Per Month. Please Continue Utilizing [employer] [phone]), for Physician Appointments & Provider Practice Visits. Please Continue Utilizing [employer] (# [phone]), a Benefit to [employer].           SDOH assessments and interventions completed:  Yes.  Care Coordination Interventions:  Yes, provided.   Follow up plan: Follow up call scheduled for 09/01/2022 at 10:00 am.  Encounter Outcome:  Pt. Visit Completed.   Nat Christen, BSW, MSW, LCSW  Licensed Education officer, environmental Health System  Mailing [place] [address]. [address] [place], [place] [address] Physical Address-[address] [place], [place] [address] Toll Free Main # [phone] Fax # [phone] Cell # [phone] [email or web address]

## 2022-08-11 NOTE — Patient Instructions (Addendum)
Medication Instructions:  DECREASE Metoprolol Succinate to 25 mg  *If you need a refill on your cardiac medications before your next appointment, please call your pharmacy*   Lab Work: Your physician recommends that you return for lab work in 6 weeks:  BMP If you have labs (blood work) drawn today and your tests are completely normal, you will receive your results only by: [place] (if you have MyChart) OR A paper copy in the mail If you have any lab test that is abnormal or we need to change your treatment, we will call you to review the results.   Testing/Procedures: Your physician has requested that you have a lexiscan myoview. For further information please visit [email or web address]. Please follow instruction sheet, as given.  Your physician has requested that you have a carotid duplex. This test is an ultrasound of the carotid arteries in your neck. It looks at blood flow through these arteries that supply the brain with blood. Allow one hour for this exam. There are no restrictions or special instructions.  ZIO XT- Long Term Monitor Instructions  Your physician has requested you wear a ZIO patch monitor for 14 days.  This is a single patch monitor. [employer] supplies one patch monitor per enrollment. Additional stickers are not available. Please do not apply patch if you will be having a Nuclear Stress Test,  Echocardiogram, Cardiac CT, MRI, or Chest Xray during the period you would be wearing the  monitor. The patch cannot be worn during these tests. You cannot remove and re-apply the  ZIO XT patch monitor.  Your ZIO patch monitor will be mailed 3 day USPS to your address on file. It may take 3-5 days  to receive your monitor after you have been enrolled.  Once you have received your monitor, please review the enclosed instructions. Your monitor  has already been registered assigning a specific monitor serial # to you.  Billing and Patient Assistance Program  Information  We have supplied [employer] with any of your insurance information on file for billing purposes. [employer] offers a sliding scale Patient Assistance Program for patients that do not have  insurance, or whose insurance does not completely cover the cost of the ZIO monitor.  You must apply for the Patient Assistance Program to qualify for this discounted rate.  To apply, please call [employer] at [phone], select option 4, select option 2, ask to apply for  Patient Assistance Program. Theodore Demark will ask your household income, and how many people  are in your household. They will quote your out-of-pocket cost based on that information.  [employer] will also be able to set up a 33-month, interest-free payment plan if needed.  Applying the monitor   Shave hair from upper left chest.  Hold abrader disc by orange tab. Rub abrader in 40 strokes over the upper left chest as  indicated in your monitor instructions.  Clean area with 4 enclosed alcohol pads. Let dry.  Apply patch as indicated in monitor instructions. Patch will be placed under collarbone on left  side of chest with arrow pointing upward.  Rub patch adhesive wings for 2 minutes. Remove white label marked "1". Remove the white  label marked "2". Rub patch adhesive wings for 2 additional minutes.  While looking in a mirror, press and release button in center of patch. A small green light will  flash 3-4 times. This will be your only indicator that the monitor has been turned on.  Do not shower for the first 24  hours. You may shower after the first 24 hours.  Press the button if you feel a symptom. You will hear a small click. Record Date, Time and  Symptom in the Patient Logbook.  When you are ready to remove the patch, follow instructions on the last 2 pages of Patient  Logbook. Stick patch monitor onto the last page of Patient Logbook.  Place Patient Logbook in the blue and white box. Use locking tab on box and tape box closed   securely. The blue and white box has prepaid postage on it. Please place it in the mailbox as  soon as possible. Your physician should have your test results approximately 7 days after the  monitor has been mailed back to [facility].  Call [place] at [phone] if you have questions regarding  your ZIO XT patch monitor. Call them immediately if you see an orange light blinking on your  monitor.  If your monitor falls off in less than 4 days, contact our Monitor department at [phone].  If your monitor becomes loose or falls off after 4 days call [employer] at [phone] for  suggestions on securing your monitor  Follow-Up: At [facility], you and your health needs are our priority.  As part of our continuing mission to provide you with exceptional heart care, we have created designated Provider Care Teams.  These Care Teams include your primary Cardiologist (physician) and Advanced Practice Providers (APPs -  Physician Assistants and Nurse Practitioners) who all work together to provide you with the care you need, when you need it.  We recommend signing up for the patient portal called "MyChart".  Sign up information is provided on this After Visit Summary.  MyChart is used to connect with patients for Virtual Visits (Telemedicine).  Patients are able to view lab/test results, encounter notes, upcoming appointments, etc.  Non-urgent messages can be sent to your provider as well.   To learn more about what you can do with MyChart, go to [email or web address].    Your next appointment:   6-8 week(s)  Provider:   Finis Bud, NP  Other Instructions

## 2022-08-11 NOTE — Telephone Encounter (Signed)
Checking percert on the following patient for testing scheduled at [facility].   lEXISCAN  - 08/25/2022   14 DAY zio monitor

## 2022-08-11 NOTE — Patient Instructions (Signed)
Visit Information  Thank you for taking time to visit with me today. Please don't hesitate to contact me if I can be of assistance to you.   Following are the goals we discussed today:   Goals Addressed               This Visit's Progress     Reduce and Manage My Symptoms of Anxiety and Depression. (pt-stated)   On track     Care Coordination Interventions:  Solution-Focused Strategies Implemented. Problem-Solving Interventions Activated. Task-Centered Activities Employed. Active Listening & Reflection Utilized.  Verbalization of Feelings Encouraged. Emotional Stress Acknowledged. Emotional Support Provided. Client-Centered Therapy Initiated. Cognitive Behavioral Therapy Performed. CSW Collaboration with Primary Care Provider, Dr. Rozann Lesches with [place] (# [phone]), Via Secure Chat Message in [place], to [place] About Referral to Pain Management Clinic for Phantom Limb Pain. ~ Secure Chat Message Via Epic Submitted to Primary Care Provider, Dr. Rozann Lesches with [place] (# [phone]), on 08/11/2022, as CSW Awaits a Response. CSW Collaboration with Nurse Practitioner, Finis Bud with Rande Lawman at [place], [place] [phone]), Via Secure Chat Message in [place], to [place] About Referral to Pain Management Clinic for Phantom Limb Pain. ~ Secure Chat Message Via Epic Received from Nurse Practitioner, Finis Bud with [facility]man at [facility] [phone]), on 08/11/2021, Explaining No Referral Made, as There Are No Pain Management Specialists in [place], [place], Encouraging CSW to Follow-Up with Primary Care Provider, Dr. Rozann Lesches with [place] (# [phone]). Congratulations on Magazine features editor through Medicare/Medicaid, to [employer] a Counsellor. Condolences on Denial Appeal Process through [facility] [phone]), to Try & Obtain More Hours of  [place] Per Month. Please Continue Utilizing [employer] [phone]), for Physician Appointments & Provider Practice Visits. Please Continue Utilizing [employer] (# [phone]), a Benefit to [employer].           Our next appointment is by telephone on 09/01/2022 at 10:00 am.  Please call the care guide team at [phone] if you need to cancel or reschedule your appointment.   If you are experiencing a Mental Health or [place] or need someone to talk to, please call the Suicide and Crisis Lifeline: 988 call the [place] National Suicide Prevention Lifeline: (510)492-8773 or TTY: 415-081-1155 TTY 414 201 0939) to talk to a trained counselor call 1-800-273-TALK (toll free, 24 hour hotline) go to [facility] [address], [place] [phone]) call the [place]: [phone] call 911  Patient verbalizes understanding of instructions and care plan provided today and agrees to view in [place]. Active MyChart status and patient understanding of how to access instructions and care plan via MyChart confirmed with patient.     Telephone follow up appointment with care management team member scheduled for:  09/01/2022 at 10:00 am.    Nat Christen, BSW, MSW, [place]  Licensed Clinical Social Worker  [place]  Mailing [place]. [address] [place], [place] [address] Physical Address-[address] [place], [place] [address] Toll Free Main # [phone] Fax # [phone] Cell # [phone] [email or web address]

## 2022-08-11 NOTE — Addendum Note (Signed)
Addended by: Jacqulynn Cadet on: 08/11/2022 05:07 PM   Modules accepted: Orders

## 2022-08-24 ENCOUNTER — Encounter: Payer: Self-pay | Admitting: *Deleted

## 2022-08-24 ENCOUNTER — Ambulatory Visit: Payer: Self-pay | Admitting: *Deleted

## 2022-08-24 NOTE — Patient Outreach (Signed)
  Care Coordination   Follow Up Visit Note   08/24/2022 Name: [name] MRN: [id number] DOB: [birth year]  [name] is a 52 y.o. year old female who sees [place], Aundra [place], [place] for primary care. I spoke with  Estell Harpin by phone today.  What matters to the patients health and wellness today?  Pain Management and obtaining a motorized wheelchair    Goals Addressed             This Visit's Progress    Phantom Limb Pain Management       Care Coordination Interventions: Reviewed provider established plan for pain management Discussed importance of adherence to all scheduled medical appointments Counseled on the importance of reporting any/all new or changed pain symptoms or management strategies to pain management provider Advised patient to report to care team affect of pain on daily activities Discussed use of relaxation techniques and/or diversional activities to assist with pain reduction (distraction, imagery, relaxation, massage, acupressure, TENS, heat, and cold application Reviewed with patient prescribed pharmacological and nonpharmacological pain relief strategies Advised patient to discuss if ketamine would be a good treatment option for her phantom pain with provider Assessed social determinant of health barriers Discussed Bil AKA. Most recent was in 09/2021. Healed up nicely. Still a little tight.  Discussed mobility. Using a wheelchair but would like a motorized wheelchair. Application appeal was approved but she hasn't received the wheelchair. She's working with DME Supplier Rep to get necessary paperwork form PCP. She was unsure of the DME supplier name but does have their contact info and says that they have been very helpful. Reviewed LCSW notes and discussed with patient pain clinic denials. Most recent was [facility] in [place].  RNCC to collaborate with LCSW regarding denial and with PCP regarding need for new referral Discussed new  onset Left hand pain with radicular left arm pain and decreased grip strength. May be from use of manual wheelchair.  RNCC to collaborate with PCP office regarding appt. Next routine f/u isn't for 2-3 months. Encouraged to talk with LCSW regarding depression and non-pharmacological ways to manage pain Encouraged to reach out to [facility] as needed        SDOH assessments and interventions completed:  Yes SDOH Interventions Today    Flowsheet Row Most Recent Value  SDOH Interventions   Housing Interventions Intervention Not Indicated  Transportation Interventions Intervention Not Indicated  Physical Activity Interventions Patient Refused       Care Coordination Interventions:  Yes, provided   Follow up plan: Follow up call scheduled for 09/24/22    Encounter Outcome:  Pt. Visit Completed   Chong Sicilian, BSN, RN-BC RN Care Coordinator [place]: [phone] Main #: [phone]

## 2022-08-25 ENCOUNTER — Encounter (HOSPITAL_COMMUNITY): Payer: 59

## 2022-09-01 ENCOUNTER — Ambulatory Visit: Payer: Self-pay | Admitting: *Deleted

## 2022-09-01 ENCOUNTER — Encounter: Payer: Self-pay | Admitting: *Deleted

## 2022-09-01 NOTE — Patient Instructions (Signed)
Visit Information  Thank you for taking time to visit with me today. Please don't hesitate to contact me if I can be of assistance to you.   Following are the goals we discussed today:   Goals Addressed               This Visit's Progress     Reduce and Manage My Symptoms of Anxiety and Depression. (pt-stated)   On track     Care Coordination Interventions:  Active Listening & Reflection Utilized.  Verbalization of Feelings Encouraged. Emotional Stress Acknowledged. Emotional Support Provided. Client-Centered Therapy Performed. Cognitive Behavioral Therapy Initiated. Acceptance & Commitment Therapy Introduced. Solution-Focused Strategies Activated. Problem-Solving Interventions Employed. Task-Centered Activities Implemented. CSW Collaboration with Primary Care Provider, Dr. Rozann Lesches with [place] (# [phone]), Via Secure Chat Message in [place], to [place] About Referral to Pain Management Clinic for Phantom Limb Pain. ~ Secure Chat Message Via Epic Submitted to Primary Care Provider, Dr. Rozann Lesches with [place] (# [phone]), on 08/11/2022 & 09/01/2022, as CSW Awaits a Response. CSW Collaboration with Primary Care Provider, Dr. Rozann Lesches with [place] (# [phone]), Via Secure Chat Message in [place], to Inquire About Status of Paperwork/Documentation Needed & Submission to [place] Medicaid for Continued Approval of Motorized Wheelchair. ~ Secure Chat Message Via Epic Submitted to Primary Care Provider, Dr. Rozann Lesches with [place] (# [phone]), on 09/01/2022, as CSW Awaits a Response. Please Continue Utilizing [employer] [phone]), for Physician Appointments & Provider Practice Visits, a Benefit to Medicaid Recipients. Please Continue Utilizing [employer] [phone]), for Physician Appointments & Provider Practice Visits, a Benefit to [employer] Recipients.           Our next appointment is by telephone on 09/15/2022 at 11:00 am.  Please call the care guide team at [phone] if you need to cancel or reschedule your appointment.   If you are experiencing a Mental Health or [place] or need someone to talk to, please call the Suicide and Crisis Lifeline: 988 call the [place] National Suicide Prevention Lifeline: 8025792197 or TTY: 812 147 7783 TTY 256-736-2994) to talk to a trained counselor call 1-800-273-TALK (toll free, 24 hour hotline) go to [facility] [address] [place] [phone]) call the [place]: [phone] call 911  Patient verbalizes understanding of instructions and care plan provided today and agrees to view in [place]. Active MyChart status and patient understanding of how to access instructions and care plan via MyChart confirmed with patient.     Telephone follow up appointment with care management team member scheduled for:  09/15/2022 at 11:00 am.  Nat Christen, BSW, MSW, [place]  Licensed Clinical Social Worker  [place]  Mailing [place]. [address] [place], [place] [address] Physical Address[address], [place], [place] [address] Toll Free Main # [phone] Fax # [phone] Cell # [phone] [email or web address]

## 2022-09-01 NOTE — Patient Outreach (Signed)
  Care Coordination   Follow Up Visit Note   09/01/2022  Name: [name] MRN: [id number] DOB: August 11, 1970  [name] is a 52 y.o. year old female who sees [place], Aundra Millet, [place] for primary care. I spoke with Estell Harpin by phone today.  What matters to the patients health and wellness today?   Reduce and Manage My Symptoms of Anxiety and Depression.   Goals Addressed               This Visit's Progress     Reduce and Manage My Symptoms of Anxiety and Depression. (pt-stated)   On track     Care Coordination Interventions:  Active Listening & Reflection Utilized.  Verbalization of Feelings Encouraged. Emotional Stress Acknowledged. Emotional Support Provided. Client-Centered Therapy Performed. Cognitive Behavioral Therapy Initiated. Acceptance & Commitment Therapy Introduced. Solution-Focused Strategies Activated. Problem-Solving Interventions Employed. Task-Centered Activities Implemented. CSW Collaboration with Primary Care Provider, Dr. Rozann Lesches with [place] (# [phone]), Via Secure Chat Message in [employer], to [place] About Referral to Pain Management Clinic for Phantom Limb Pain. ~ Secure Chat Message Via Epic Submitted to Primary Care Provider, Dr. Rozann Lesches with [place] (# [phone]), on 08/11/2022 & 09/01/2022, as CSW Awaits a Response. CSW Collaboration with Primary Care Provider, Dr. Rozann Lesches with [place] (# [phone]), Via Secure Chat Message in [place], to Inquire About Status of Paperwork/Documentation Needed & Submission to [place] Medicaid for Continued Approval of Motorized Wheelchair. ~ Secure Chat Message Via Epic Submitted to Primary Care Provider, Dr. Rozann Lesches with [place] (# [phone]), on 09/01/2022, as CSW Awaits a Response. Please Continue Utilizing [employer] [phone]), for  Physician Appointments & Provider Practice Visits, a Benefit to Medicaid Recipients. Please Continue Utilizing [employer] [phone]), for Physician Appointments & Provider Practice Visits, a Benefit to [employer] Recipients.           SDOH assessments and interventions completed:  Yes.  Care Coordination Interventions:  Yes, provided.   Follow up plan: Follow up call scheduled for 09/15/2022 at 11:00 am.  Encounter Outcome:  Pt. Visit Completed.   Nat Christen, BSW, MSW, LCSW  Licensed Education officer, environmental Health System  Mailing [place] [address] [place], [place] [address] Physical Address-[address], [place], [place] [address] Toll Free Main # [phone] Fax # [phone] Cell # [phone] [email or web address]

## 2022-09-08 ENCOUNTER — Ambulatory Visit: Payer: 59 | Attending: Nurse Practitioner

## 2022-09-08 DIAGNOSIS — R0989 Other specified symptoms and signs involving the circulatory and respiratory systems: Secondary | ICD-10-CM

## 2022-09-15 ENCOUNTER — Ambulatory Visit: Payer: Self-pay | Admitting: *Deleted

## 2022-09-15 ENCOUNTER — Encounter (HOSPITAL_COMMUNITY)
Admission: RE | Admit: 2022-09-15 | Discharge: 2022-09-15 | Disposition: A | Discharge status: Home / Self Care | Payer: 59 | Source: Ambulatory Visit | Attending: Nurse Practitioner | Admitting: Nurse Practitioner

## 2022-09-15 ENCOUNTER — Ambulatory Visit (HOSPITAL_COMMUNITY)
Admission: RE | Admit: 2022-09-15 | Discharge: 2022-09-15 | Disposition: A | Discharge status: Home / Self Care | Payer: 59 | Source: Ambulatory Visit | Attending: Nurse Practitioner | Admitting: Nurse Practitioner

## 2022-09-15 DIAGNOSIS — R079 Chest pain, unspecified: Secondary | ICD-10-CM

## 2022-09-15 LAB — NM MYOCAR MULTI W/SPECT W/WALL MOTION / EF
Base ST Depression (mm): 0 mm
LV dias vol: 70 mL (ref 46–106)
LV sys vol: 31 mL
Nuc Stress EF: 55 %
Peak HR: 100 {beats}/min
RATE: 0.4
Rest HR: 54 {beats}/min
Rest Nuclear Isotope Dose: 10.8 mCi
SDS: 0
SRS: 3
SSS: 3
ST Depression (mm): 0 mm
Stress Nuclear Isotope Dose: 30.5 mCi
TID: 0.87

## 2022-09-15 MED ORDER — SODIUM CHLORIDE FLUSH 0.9 % IV SOLN
INTRAVENOUS | Status: AC
  Administered 2022-09-15: 10 mL via INTRAVENOUS
  Filled 2022-09-15: qty 10

## 2022-09-15 MED ORDER — TECHNETIUM TC 99M TETROFOSMIN IV KIT
30.5000 | PACK | Freq: Once | INTRAVENOUS | Status: AC | PRN
  Administered 2022-09-15: 30.5 via INTRAVENOUS

## 2022-09-15 MED ORDER — TECHNETIUM TC 99M TETROFOSMIN IV KIT
10.8000 | PACK | Freq: Once | INTRAVENOUS | Status: AC | PRN
  Administered 2022-09-15: 10.8 via INTRAVENOUS

## 2022-09-15 MED ORDER — REGADENOSON 0.4 MG/5ML IV SOLN
INTRAVENOUS | Status: AC
  Administered 2022-09-15: 0.4 mg via INTRAVENOUS
  Filled 2022-09-15: qty 5

## 2022-09-15 NOTE — Patient Outreach (Signed)
  Care Coordination   09/15/2022  Name: [name] MRN: [id number] DOB: July 23, 1971   Care Coordination Outreach Attempts:  An unsuccessful telephone outreach was attempted today to offer the patient information about available care coordination services as a benefit of their health plan. HIPAA compliant messages left on voicemail, providing contact information for CSW, encouraging patient to return CSW's call at her earliest convenience.  Follow Up Plan:  Additional outreach attempts will be made to offer the patient care coordination information and services.   Encounter Outcome:  No Answer.   Care Coordination Interventions:  No, not indicated.    Nat Christen, BSW, MSW, LCSW  Licensed Education officer, environmental Health System  Mailing [place] [address], [place], [place] [address] Physical Address-[address] [place], [place] [address] Toll Free Main # [phone] Fax # [phone] Cell # [phone] [email or web address]

## 2022-09-18 ENCOUNTER — Telehealth: Payer: Self-pay | Admitting: *Deleted

## 2022-09-18 ENCOUNTER — Other Ambulatory Visit: Payer: Self-pay | Admitting: *Deleted

## 2022-09-18 NOTE — Telephone Encounter (Signed)
Laurine Blazer, LPN [id number]  [id number] PM EST Back to Top    Notified, copy to pcp.

## 2022-09-18 NOTE — Patient Outreach (Signed)
  Care Coordination   09/18/2022  Name: [name] MRN: [id number] DOB: 07-10-1971   Care Coordination Outreach Attempts:  An unsuccessful telephone outreach was attempted today to offer the patient information about available care coordination services as a benefit of their health plan. HIPAA compliant message left on voicemail, providing contact information for CSW, encouraging patient to return CSW's call at her earliest convenience.  Follow Up Plan:  Additional outreach attempts will be made to offer the patient care coordination information and services.   Encounter Outcome:  No Answer.   Care Coordination Interventions:  No, not indicated.     Nat Christen, BSW, MSW, LCSW  Licensed Education officer, environmental Health System  Mailing [place] [address], [place], [place] [address] Physical Address-[address] [place], [place] [address] Toll Free Main # [phone] Fax # [phone] Cell # [phone] [email or web address]

## 2022-09-18 NOTE — Telephone Encounter (Signed)
-----   Message from Finis Bud, NP sent at 09/11/2022  8:30 AM EST ----- Results reviewed.  Mild blockage in right carotid artery, 1 to 39%.  Moderate blockage in left carotid artery, 60 to 79%.   I will review this with her at next office visit with me.  Recommend follow-up study in 12 months.  Finis Bud, AGNP-C

## 2022-09-21 ENCOUNTER — Telehealth: Payer: Self-pay | Admitting: *Deleted

## 2022-09-21 NOTE — Progress Notes (Unsigned)
  Care Coordination Note  09/21/2022 Name: [name] MRN: [id number] DOB: 1971/06/18  [name] is a 52 y.o. year old female who is a primary care patient of Rosine Door and is actively engaged with the care management team. I reached out to Estell Harpin by phone today to assist with re-scheduling a follow up visit with the Licensed Clinical Social Worker  Follow up plan: Unsuccessful telephone outreach attempt made. A HIPAA compliant phone message was left for the patient providing contact information and requesting a return call.   [place]  Direct Dial: [phone]

## 2022-09-22 ENCOUNTER — Other Ambulatory Visit: Payer: Self-pay | Admitting: Nurse Practitioner

## 2022-09-22 ENCOUNTER — Ambulatory Visit: Payer: 59 | Attending: Nurse Practitioner | Admitting: Nurse Practitioner

## 2022-09-22 ENCOUNTER — Ambulatory Visit: Payer: 59 | Attending: Nurse Practitioner

## 2022-09-22 ENCOUNTER — Other Ambulatory Visit: Payer: Self-pay

## 2022-09-22 ENCOUNTER — Encounter: Payer: Self-pay | Admitting: Nurse Practitioner

## 2022-09-22 ENCOUNTER — Telehealth: Payer: Self-pay | Admitting: Nurse Practitioner

## 2022-09-22 VITALS — BP 114/68 | HR 67

## 2022-09-22 DIAGNOSIS — R002 Palpitations: Secondary | ICD-10-CM

## 2022-09-22 DIAGNOSIS — I493 Ventricular premature depolarization: Secondary | ICD-10-CM

## 2022-09-22 DIAGNOSIS — Z720 Tobacco use: Secondary | ICD-10-CM

## 2022-09-22 DIAGNOSIS — Z79899 Other long term (current) drug therapy: Secondary | ICD-10-CM

## 2022-09-22 DIAGNOSIS — R079 Chest pain, unspecified: Secondary | ICD-10-CM

## 2022-09-22 DIAGNOSIS — I10 Essential (primary) hypertension: Secondary | ICD-10-CM

## 2022-09-22 DIAGNOSIS — E785 Hyperlipidemia, unspecified: Secondary | ICD-10-CM

## 2022-09-22 DIAGNOSIS — I5022 Chronic systolic (congestive) heart failure: Secondary | ICD-10-CM

## 2022-09-22 DIAGNOSIS — M79602 Pain in left arm: Secondary | ICD-10-CM

## 2022-09-22 DIAGNOSIS — I5032 Chronic diastolic (congestive) heart failure: Secondary | ICD-10-CM

## 2022-09-22 DIAGNOSIS — I6523 Occlusion and stenosis of bilateral carotid arteries: Secondary | ICD-10-CM

## 2022-09-22 DIAGNOSIS — R0989 Other specified symptoms and signs involving the circulatory and respiratory systems: Secondary | ICD-10-CM

## 2022-09-22 DIAGNOSIS — I739 Peripheral vascular disease, unspecified: Secondary | ICD-10-CM

## 2022-09-22 DIAGNOSIS — R5382 Chronic fatigue, unspecified: Secondary | ICD-10-CM

## 2022-09-22 MED ORDER — METOPROLOL SUCCINATE ER 25 MG PO TB24: 12.5000 mg | ORAL_TABLET | Freq: Every day | ORAL | 1 refills | Status: DC

## 2022-09-22 NOTE — Progress Notes (Signed)
  Care Coordination Note  09/22/2022 Name: [name] MRN: [id number] DOB: [birth year]  [name] is a 52 y.o. year old female who is a primary care patient of Rosine Door and is actively engaged with the care management team. I reached out to Estell Harpin by phone today to assist with re-scheduling a follow up visit with the Licensed Clinical Social Worker  Follow up plan: Unsuccessful telephone outreach attempt made. A HIPAA compliant phone message was left for the patient providing contact information and requesting a return call. We have been unable to make contact with the patient for follow up.  [place]  Direct Dial: [phone]

## 2022-09-22 NOTE — Patient Instructions (Addendum)
Patient Instructions: You have been referred to Orthopedics.  Medication Instructions:  Your physician has recommended you make the following change in your medication:  Decrease metoprolol to 12.5 mg (1/2 tablet) once a day Continue all other medications as directed  Labwork: BMET (due ASAP)  Testing/Procedures: ZIO- Long Term Monitor Instructions   Your physician has requested you wear your ZIO patch monitor 14 days.   This is a single patch monitor.  Irhythm supplies one patch monitor per enrollment.  Additional stickers are not available.   Please do not apply patch if you will be having a Nuclear Stress Test, Echocardiogram, Cardiac CT, MRI, or Chest Xray during the time frame you would be wearing the monitor. The patch cannot be worn during these tests.  You cannot remove and re-apply the ZIO XT patch monitor.   Do not shower for the first 24 hours.  You may shower after the first 24 hours.   Press button if you feel a symptom. You will hear a small click.  Record Date, Time and Symptom in the Patient Log Book.   When you are ready to remove patch, follow instructions on last 2 pages of Patient Log Book.  Stick patch monitor onto last page of Patient Log Book.   Place Patient Log Book in [place] box.  Use locking tab on box and tape box closed securely.  The Orange and [employer] has [employer] on it.  Please place in mailbox as soon as possible.  Your physician should have your test results approximately 7 days after the monitor has been mailed back to [facility].   Call [place] at [phone] if you have questions regarding your ZIO XT patch monitor.  Call them immediately if you see an orange light blinking on your monitor.   If your monitor falls off in less than 4 days contact our Monitor department at [phone].  If your monitor becomes loose or falls off after 4 days call [employer] at [phone] for suggestions on securing your monitor.    Follow-Up:  Your physician recommends that you schedule a follow-up appointment in: 2-3 months  Any Other Special Instructions Will Be Listed Below (If Applicable).  If you need a refill on your cardiac medications before your next appointment, please call your pharmacy.

## 2022-09-22 NOTE — Progress Notes (Signed)
Cardiology Office Note:    Date:  09/22/2022   ID:  [name], DOB 1971-05-26, MRN [id number]  PCP:  Skillman, Katherine E, [place] [facility] Cardiologist:  Rozann Lesches, MD     Referring MD: Rosalee Kaufman, *   CC: Here for follow-up   History of Present Illness:    [name] is a 52 y.o. female with a hx of the following:  HFmrEF Frequent PVC's, hx of palpitations HTN HLD PAD, s/p bilateral AKA Asthma CKD stage 3 T2DM Tobacco abuse Pericardial effusion  Patient is a 52 year old female with past medical history as mentioned above.  Follows [facility] for history of early onset of peripheral vascular occlusive disease, history of multiple bilateral procedures in [place]. She is now s/p bilateral AKA and continues to use tobacco.   Was referred to cardiology services in July 2023 and saw Dr. Domenic Polite for the first time on January 29, 2022. CC of palpitations and documented PVCs.  EKG revealed sinus rhythm, frequent PVCs/ PACs.  TTE in 2013 revealed EF 65 to 70%.  72 hour Zio patch revealed sinus rhythm, rare PACs, frequent PVCs, multiple brief bursts of NSVT, no pauses or other sustained ventricular arrhythmias.  Updated TTE revealed EF reduced at 40%, small pericardial effusion noted that was circumferential, without evidence of cardiac tamponade, mild to moderate MR, mild calcification of aortic valve, mild AR. Carvedilol switched to Toprol XL 25 mg daily.   06/29/2022 - Noted occasional palpitations at night that were brief. Updated Echo revealed improved EF to 50-55%, no RWMA, grade 1 DD, mild MR, mild to moderate AR, no other significant valvular abnormalities.   08/11/2022 -  Presents with multiple concerns. Admitted to [facility] soreness from pushing wheelchair, recently got approved for a motorized wheelchair. Noted CP at times, intermittent and chronic per her report. Located along lower chest/epigastric area, mid with radiation to shoulder at  times. Associated shortness of breath, felt ike an anxiety attack. Noted fatigue and poor appetite, sometimes felt lightheaded. Noted occasional palpitations. NST arranged and was negative. Carotid doppler showed mild R ICA stenosis and moderate L ICA stenosis.   09/22/2022 - CP is better from last visit. Still notes palpitations, fatigue, and poor appetite. Denies any shortness of breath, syncope, presyncope, dizziness, orthopnea, PND, swelling or significant weight changes, acute bleeding, or claudication.  Past Medical History:  Diagnosis Date   Anemia 02/05/2012   Anxiety    Arthritis    Asthma    CKD (chronic kidney disease) stage 3, GFR 30-59 ml/min (HCC) 09/25/2021   COPD (chronic obstructive pulmonary disease) (HCC)    Depression    Essential hypertension    Fatty liver    GERD (gastroesophageal reflux disease)    Headache(784.0)    PAD (peripheral artery disease) (HCC)    Status post bilateral AKA   PE (pulmonary embolism)    Xarelto   Pneumonia 2013   Sepsis ([place])    Type 2 diabetes mellitus ([place])     Past Surgical History:  Procedure Laterality Date   ABDOMINAL AORTOGRAM W/LOWER EXTREMITY N/A 09/05/2021   Procedure: ABDOMINAL AORTOGRAM W/LOWER EXTREMITY;  Surgeon: Cherre Robins, MD;  Location: [facility];  Service: Cardiovascular;  Laterality: N/A;   AMPUTATION Right 09/24/2021   Procedure: RIGHT ABOVE KNEE AMPUTATION;  Surgeon: Cherre Robins, MD;  Location: [facility] OR;  Service: Vascular;  Laterality: Right;   APPENDECTOMY     CHOLECYSTECTOMY     DIAGNOSTIC LAPAROSCOPY  ESOPHAGEAL DILATION N/A 10/09/2014   Procedure: ESOPHAGEAL DILATION;  Surgeon: Danie Binder, MD;  Location: [facility];  Service: Endoscopy;  Laterality: N/A;  #15 and #16 savory   ESOPHAGOGASTRODUODENOSCOPY (EGD) WITH PROPOFOL N/A 10/09/2014   Dr. Oneida Alar: mild erosive gastritis, Savary dilation. Nausea and vomiting most likely due to uncontroleld blood sugars, reflux, and gastritis.    Current  Meds  Medication Sig   ALPRAZolam (XANAX) 0.5 MG tablet Take 1 tablet (0.5 mg total) by mouth 3 (three) times daily as needed for anxiety.   aspirin EC 81 MG tablet Take 81 mg by mouth daily. Swallow whole.   atorvastatin (LIPITOR) 80 MG tablet SMARTSIG:1 Tablet(s) By Mouth Every Evening   Cholecalciferol (VITAMIN D3 PO) Take 1 tablet by mouth daily.   clopidogrel (PLAVIX) 75 MG tablet Take 75 mg by mouth daily.   docusate sodium (COLACE) 100 MG capsule Take 100 mg by mouth as needed.   escitalopram (LEXAPRO) 20 MG tablet Take 20 mg by mouth daily.   gabapentin (NEURONTIN) 400 MG capsule Take 400 mg by mouth 4 (four) times daily.   insulin glargine (LANTUS) 100 unit/mL SOPN Inject 36 Units into the skin 2 (two) times daily.   JARDIANCE 25 MG TABS tablet Take 25 mg by mouth daily.   linaclotide (LINZESS) 72 MCG capsule Take 72 mcg by mouth as needed.   methocarbamol (ROBAXIN) 500 MG tablet Take 500 mg by mouth 2 (two) times daily.   ondansetron (ZOFRAN) 4 MG tablet Take 4 mg by mouth every 6 (six) hours as needed for nausea or vomiting.   oxyCODONE-acetaminophen (PERCOCET) 7.5-325 MG tablet Take 1 tablet by mouth as needed for severe pain.   oxyCODONE-acetaminophen (PERCOCET/ROXICET) 5-325 MG tablet Take 1 tablet by mouth every 4 (four) hours as needed for severe pain.   OZEMPIC, 0.25 OR 0.5 MG/DOSE, 2 MG/3ML SOPN Inject 0.5 mg into the skin once a week.   pantoprazole (PROTONIX) 40 MG tablet Take 1 tablet (40 mg total) by mouth daily.   sacubitril-valsartan (ENTRESTO) 24-26 MG Take 1 tablet by mouth 2 (two) times daily. 30 day free voucher will be faxed   topiramate (TOPAMAX) 50 MG tablet Take 50 mg by mouth 2 (two) times daily.   vitamin B-12 1000 MCG tablet Take 1 tablet (1,000 mcg total) by mouth daily.   metoprolol succinate (TOPROL-XL) 25 MG 24 hr tablet Take 1 tablet (25 mg total) by mouth daily. Take with or immediately following a meal.    Allergies:   Patient has no known allergies.    Social History   Socioeconomic History   Marital status: Single    Spouse name: Not on file   Number of children: Not on file   Years of education: 12   Highest education level: 12th grade  Occupational History   Not on file  Tobacco Use   Smoking status: Every Day    Packs/day: 1.00    Years: 20.00    Total pack years: 20.00    Types: Cigarettes    Passive exposure: Current   Smokeless tobacco: Never  Vaping Use   Vaping Use: Former  Substance and Sexual Activity   Alcohol use: No   Drug use: No   Sexual activity: Yes    Birth control/protection: Injection  Other Topics Concern   Not on file  Social History Narrative   Not on file   Social Determinants of Health   Financial Resource Strain: Low Risk  (06/15/2022)   Overall Financial  Resource Strain (CARDIA)    Difficulty of Paying Living Expenses: Not hard at all  Food Insecurity: No Food Insecurity (06/15/2022)   Hunger Vital Sign    Worried About Running Out of Food in the Last Year: Never true    Ran Out of Food in the Last Year: Never true  Transportation Needs: No Transportation Needs (08/24/2022)   PRAPARE - Hydrologist (Medical): No    Lack of Transportation (Non-Medical): No  Physical Activity: Inactive (08/24/2022)   Exercise Vital Sign    Days of Exercise per Week: 0 days    Minutes of Exercise per Session: 0 min  Stress: Stress Concern Present (03/18/2022)   [place]    Feeling of Stress : Rather much  Social Connections: Unknown (03/18/2022)   Social Connection and Isolation Panel [NHANES]    Frequency of Communication with Friends and Family: More than three times a week    Frequency of Social Gatherings with Friends and Family: More than three times a week    Attends Religious Services: More than 4 times per year    Active Member of [employer] or Organizations: No    Attends Archivist Meetings:  Never    Marital Status: Not on file     Family History: The patient's family history includes Breast cancer in her mother; Diabetes in her brother and sister; Kidney disease in her sister. There is no history of Colon cancer or Liver disease.  ROS:     Please see the history of present illness.    All other systems reviewed and are negative.  EKGs/Labs/Other Studies Reviewed:    The following studies were reviewed today:   EKG:  EKG is not ordered today.    Myoview on 09/15/2022:    Baseline EKG showed normal sinus rhythm and frequent PVCs. Stress ECG is negative for ischemia but has frequent PVCs in a pattern of ventricular quadrigeminy and trigeminy.   LV perfusion is normal. There is no evidence of ischemia. There is no evidence of infarction.   Left ventricular function is normal. Nuclear stress EF: 55 %.   The study is normal. The study is low risk.  Carotid doppler on 09/08/2022:  Summary:  Right Carotid: Velocities in the right ICA are consistent with a 1-39%  stenosis. The ECA appears >50% stenosed.   Left Carotid: Velocities in the left ICA are consistent with a 60-79%  stenosis.  Hemodynamically significant plaque >50% visualized in the  CCA. The ECA appears >50% stenosed.   Vertebrals:  Right vertebral artery demonstrates antegrade flow. Left  vertebral artery demonstrates no discernable flow.  Subclavians: Normal flow hemodynamics were seen in bilateral subclavian arteries.  Echocardiogram on 07/16/2022:  1. Left ventricular ejection fraction, by estimation, is 50 to 55%. The  left ventricle has low normal function. The left ventricle has no regional  wall motion abnormalities. Left ventricular diastolic parameters are  consistent with Grade I diastolic  dysfunction (impaired relaxation).   2. Right ventricular systolic function is normal. The right ventricular  size is normal. Tricuspid regurgitation signal is inadequate for assessing  PA pressure.   3. The  mitral valve is abnormal. Mild mitral valve regurgitation. No  evidence of mitral stenosis.   4. The aortic valve has an indeterminant number of cusps. There is mild  calcification of the aortic valve. There is mild thickening of the aortic  valve. Aortic valve regurgitation is mild  to moderate. No aortic stenosis  is present.   5. The inferior vena cava is normal in size with greater than 50%  respiratory variability, suggesting right atrial pressure of 3 mmHg.   Comparison(s): Echocardiogram done 02/03/22 showed an EF of 40%.  72 hour Zio monitor on 02/16/22: Predominant rhythm is sinus with heart rate ranging from 83 bpm up to 116 bpm and average heart rate 94 bpm. There were rare PACs representing less than 1% total beats. There were frequent PVCs representing approximately 14% total beats, also frequent ventricular couplets and triplets with limited episodes of ventricular bigeminy and trigeminy.  Multiple, brief episodes of NSVT were noted, the longest of which was 7 beats.  There were no sustained ventricular arrhythmias. No pauses.  2D Echo on 02/03/22:  1. Left ventricular ejection fraction, by estimation, is 40%. The left  ventricle has mildly decreased function. The left ventricle demonstrates  global hypokinesis. Left ventricular diastolic parameters are  indeterminate. The average left ventricular  global longitudinal strain is -12.4 %. The global longitudinal strain is  abnormal.   2. Right ventricular systolic function is normal. The right ventricular  size is normal. Tricuspid regurgitation signal is inadequate for assessing  PA pressure.   3. A small pericardial effusion is present. The pericardial effusion is  circumferential. There is no evidence of cardiac tamponade.   4. The mitral valve is abnormal. Mild to moderate mitral valve  regurgitation. No evidence of mitral stenosis.   5. The aortic valve is tricuspid. There is mild calcification of the  aortic valve. There  is mild thickening of the aortic valve. Aortic valve  regurgitation is mild.   6. The inferior vena cava is normal in size with greater than 50%  respiratory variability, suggesting right atrial pressure of 3 mmHg.   Comparison(s): Echocardiogram done 01/28/12 showed an EF of 65-70%.  Recent Labs: 09/24/2021: ALT 9 09/27/2021: Magnesium 2.4 10/02/2021: BUN 26; Creatinine, Ser 1.50; Hemoglobin 7.4; Platelets 318; Potassium 5.0; Sodium 126  Recent Lipid Panel No results found for: "CHOL", "TRIG", "HDL", "CHOLHDL", "VLDL", "LDLCALC", "LDLDIRECT"  Physical Exam:    VS:  BP 114/68   Pulse 67   SpO2 96%     Wt Readings from Last 3 Encounters:  10/15/21 191 lb (86.6 kg)  09/24/21 190 lb (86.2 kg)  09/05/21 185 lb (83.9 kg)     GEN: Well nourished, well developed in no acute distress, appears fatigued HEENT: Normal NECK: No JVD; Carotid bruit along left neck, no carotid bruit along right neck.  CARDIAC: S1/S2, regular rate with abnormal rhythm due to early beats noted, no murmurs, rubs, gallops; 2+ peripheral pulses throughout, strong and equal bilaterally RESPIRATORY:  Clear and diminished to auscultation without rales, wheezing or rhonchi  MUSCULOSKELETAL:  No edema; Bilateral AKA, wheelchair bound, otherwise normal SKIN: Pale appearance, warm and dry NEUROLOGIC:  Alert and oriented x 3 PSYCHIATRIC:  Normal affect   ASSESSMENT:    1. Chest pain, unspecified type   2. Heart failure with improved ejection fraction (HFimpEF) ([address])   3. Medication management   4. Palpitations   5. Frequent PVCs   6. Essential hypertension, benign   7. PAD (peripheral artery disease) ([place])   8. Bruit of left carotid artery   9. Bilateral carotid artery stenosis   10. Hyperlipidemia, unspecified hyperlipidemia type   11. Tobacco abuse   12. Chronic fatigue   13. Left arm pain     PLAN:    In order of problems  listed above:   Chest pain of uncertain etiology  Improved since last visit. NST  08/2022 was negative for ischemia. Continue current medication regimen. Decreasing Toprol as mentioned below. ED precautions discussed. Heart healthy diet and regular cardiovascular exercise encouraged.   HFmrEF - > HFimpEF, medication management TTE in July 2023 revealed EF decreased at 40%, global hypokinesis of left ventricle noted. EF improved to 50-55% 06/2022.  Etiology unclear, although could be due to ischemic cardiomyopathy. Euvolemic and well compensated on exam. Continue aspirin, Lipitor, Plavix, Jardiance, Toprol-XL, and Entresto. Will decrease Metoprolol to 12.5 mg daily for fatigue- see below. Low sodium diet, fluid restriction <2L, and daily weights encouraged. Educated to contact our office for weight gain of 2 lbs overnight or 5 lbs in one week. Heart healthy diet encouraged.  Will obtain BMET to see if Delene Loll can be titrated up.   Palpitations, frequent PVC's NST revealed frequent PVC's. Does admit to palpitations.  Did not complete monitor arranged at last OV. Will arrange 14 day monitor for evaluation. Decreasing Toprol-XL as mentioned above for fatigue. I have encouraged her to cut back on this and to eliminate caffeine.  Continue current medication regimen. Heart healthy diet encouraged. May very well likely benefit from EP referral in future. Will discuss at next follow-up visit after reviewing monitor.   HTN Blood pressure today 114/68. Decreasing Toprol XL for fatigue. Discussed to monitor BP at home at least 2 hours after medications and sitting for 5-10 minutes. Previously given BP log and recommended to obtain an OMRON cuff. Heart healthy diet recommended.   PAD, s/p Bilateral AKA, left carotid bruit, carotid artery stenosis Following [facility].  Continue aspirin, Lipitor, and Plavix.  Heart healthy diet recommended.  Continue to follow-up with [facility].  Carotid doppler showed mild stenosis in right carotid artery, 1 to 39%, moderate stenosis in left carotid artery, 60 to 79%. Continue  current medication regimen - suggest follow-up study 08/2023.   HLD Labs 03/2022 revealed total cholesterol 226, HDL 35, LDL 162, and triglycerides 159.  Continue Lipitor.  Heart healthy diet recommended. Recommend repeating FLP at next OV and if LDL is not at goal, consider Zetia/ or refer to [facility].   6. Tobacco abuse Patient continues to smoke; however she is not interested in quitting smoking at this time.  She politely declined resources that I offered her. Smoking cessation encouraged and discussed.  7. Chronic fatigue Admits to poor appetite. Recommended Glucerna supplements and smoking cessation. Will reduce Toprol XL as mentioned above to see if this helps her symptoms.   8. Left arm pain Sounds MSK related. Will refer to orthopedics. Recommended Tylenol 1,000 mg BID PRN.   8. Disposition: Follow-up with me or APP in 2-3 months or sooner if anything changes.   Medication Adjustments/Labs and Tests Ordered: Current medicines are reviewed at length with the patient today.  Concerns regarding medicines are outlined above.  No orders of the defined types were placed in this encounter.  Meds ordered this encounter  Medications   metoprolol succinate (TOPROL-XL) 25 MG 24 hr tablet    Sig: Take 0.5 tablets (12.5 mg total) by mouth daily. Take with or immediately following a meal.    Dispense:  45 tablet    Refill:  1    09/22/22 dose decrease    Patient Instructions  Medication Instructions:  You have been referred to Orthopedics. Your physician has recommended you make the following change in your medication:  Decrease metoprolol to 12.5 mg (1/2  tablet) once a day Continue all other medications as directed  Labwork: BMET (due ASAP)  Testing/Procedures: ZIO- Long Term Monitor Instructions   Your physician has requested you wear your ZIO patch monitor 14 days.   This is a single patch monitor.  Ir[employer] supplies one patch monitor per enrollment.  Additional stickers  are not available.   Please do not apply patch if you will be having a Nuclear Stress Test, Echocardiogram, Cardiac CT, MRI, or Chest Xray during the time frame you would be wearing the monitor. The patch cannot be worn during these tests.  You cannot remove and re-apply the ZIO XT patch monitor.   Do not shower for the first 24 hours.  You may shower after the first 24 hours.   Press button if you feel a symptom. You will hear a small click.  Record Date, Time and Symptom in the Patient Log Book.   When you are ready to remove patch, follow instructions on last 2 pages of Patient Log Book.  Stick patch monitor onto last page of Patient Log Book.   Place Patient Log Book in [place] box.  Use locking tab on box and tape box closed securely.  The [employer] has [employer] on it.  Please place in mailbox as soon as possible.  Your physician should have your test results approximately 7 days after the monitor has been mailed back to [facility].   Call [place] at [phone] if you have questions regarding your ZIO XT patch monitor.  Call them immediately if you see an orange light blinking on your monitor.   If your monitor falls off in less than 4 days contact our Monitor department at [phone].  If your monitor becomes loose or falls off after 4 days call [employer] at [phone] for suggestions on securing your monitor.   Follow-Up:  Your physician recommends that you schedule a follow-up appointment in: 2-3 months  Any Other Special Instructions Will Be Listed Below (If Applicable).  If you need a refill on your cardiac medications before your next appointment, please call your pharmacy.    Signed, Finis Bud, NP  09/22/2022 10:28 AM    [place]

## 2022-09-22 NOTE — Telephone Encounter (Signed)
14 day ZIO placed and enrolled [id number], EP   PERCERT

## 2022-09-24 ENCOUNTER — Encounter: Payer: Self-pay | Admitting: *Deleted

## 2022-09-24 ENCOUNTER — Ambulatory Visit: Payer: Self-pay | Admitting: *Deleted

## 2022-09-25 NOTE — Patient Outreach (Signed)
Care Coordination   Follow Up Visit Note   09/24/2022 Name: [name] MRN: [id number] DOB: 1970/12/03  [name] is a 52 y.o. year old female who sees [place], Aundra [place], [place] for primary care. I spoke with  Estell Harpin by phone today.  What matters to the patients health and wellness today?  Reapplying for PCS services, monitoring blood pressure, and managing pain    Goals Addressed             This Visit's Progress    Care Coordination Services       Care Coordination Goals: Patient will talk with LCSW regarding effect of medicaid cancellation on PCS services. Medicaid was cancelled because mail was sent to wrong address and returned to [employer] Patient wasn't aware. She has now reapplied and Medicaid will resume, but her PCS services of 30 hours a week was discontinued and she will have to reapply.  Patient will reach out to [facility] Coordinator [phone] with any care coordination or resource needs     Manage Blood Pressure       Care Coordination Goals: Patient will take medication as directed Patient will keep follow-up appointments with PCP and specialists Patient will monitor and record blood pressure daily and as needed Patient will call PCP/cardio with any readings outside of recommended range Patient will follow a low sodium/heart healthy diet Patient will call RN Care Coordinator with any care coordination or resource needs     Phantom Limb Pain Management   Not on track    Care Coordination Interventions: Patient will keep f/u with PCP Patient will discuss depression and pain symptoms with PCP. Cymbalta may be a good option for her because of it's indication for both depression and some types of pain management Patient will talk with PCP about possibly increasing gabapentin dosage since she hasn't been accepted into a pain management clinic Patient will practice good sleep hygiene to improve sleep quantity and quality Patient will talk  with LCSW about coping strategies Patient will reach out to Care Coordination  Team as needed        SDOH assessments and interventions completed:  Yes  SDOH Interventions Today    Flowsheet Row Most Recent Value  SDOH Interventions   Housing Interventions Intervention Not Indicated  Transportation Interventions Intervention Not Indicated        Care Coordination Interventions:  Yes, provided  Interventions Today    Flowsheet Row Most Recent Value  Chronic Disease   Chronic disease during today's visit Hypertension (HTN), Other  [chronic pain (phantom limb pain)]  General Interventions   General Interventions Discussed/Reviewed General Interventions Discussed, Doctor Visits, Durable Medical Equipment (DME), Level of Care  Doctor Visits Discussed/Reviewed PCP, Specialist  Durable Medical Equipment (DME) BP Cuff  [researched insurance coverage and requested blood pressure cuff ordered and mailed from [facility]  PCP/Specialist Visits Compliance with follow-up visit  Level of [name] Discussed/Reviewed Coping Strategies, Depression, Refer to Social Work for resources  Refer to Social Work for resources regarding Psychologist, prison and probation services with reapplying for PCS]  Pharmacy Interventions   Pharmacy Dicussed/Reviewed Medications and their functions  Safety Interventions   Safety Discussed/Reviewed [place]  [uses manual wheelchair due to BLE amputations. Has not been approved for motorized wheelchair.]       Follow up plan: Follow up call scheduled for 10/08/22    Encounter Outcome:  Pt. Visit Completed   Chong Sicilian, BSN, RN-BC RN Care Coordinator [place] Direct Dial: [phone] Main #: [phone]

## 2022-09-28 ENCOUNTER — Ambulatory Visit: Payer: Self-pay | Admitting: *Deleted

## 2022-09-28 ENCOUNTER — Encounter: Payer: Self-pay | Admitting: *Deleted

## 2022-09-28 NOTE — Patient Outreach (Signed)
Care Coordination   Follow Up Visit Note   09/28/2022  Name: [name] MRN: [id number] DOB: 1971/01/04  [name] is a 52 y.o. year old female who sees [name], [name], [place] for primary care. I spoke with [name] by phone today.  What matters to the patients health and wellness today?  Receive Assistance Reinstating Personal Care Services.   Goals Addressed               This Visit's Progress     Receive Assistance Reinstating Personal Care Services. (pt-stated)   On track     Care Coordination Interventions:  Interventions Today    Flowsheet Row Most Recent Value  Chronic Disease   Chronic disease during today's visit Hypertension (HTN), Diabetes, Chronic Kidney Disease/End Stage Renal Disease (ESRD), Other  [Chronic Fantom Limb Pain, Requires Assistance with Activities of Daily Living & Tobacco Dependence]  General Interventions   General Interventions Discussed/Reviewed General Interventions Discussed, General Interventions Reviewed, Annual Eye Exam, Labs, Annual Foot Exam, Durable Medical Equipment (DME), Vaccines, Health Screening, Community Resources, Level of Care, Communication with, Doctor Visits  [Primary Care Provider]  Labs Hgb A1c every 3 months, Kidney Function  [Encouraged]  Vaccines COVID-19, Flu, Pneumonia, RSV, Shingles, Tetanus/Pertussis/Diphtheria  [Encouraged]  Doctor Visits Discussed/Reviewed Doctor Visits Discussed, Doctor Visits Reviewed, Annual Wellness Visits, PCP, Specialist  [Encouraged]  Health Screening Colonoscopy, Mammogram  [Encouraged]  Durable Medical Equipment (DME) BP Cuff, Glucomoter, Oxygen, Environmental consultant, Community education officer  PCP/Specialist Visits Compliance with follow-up visit  [Encouraged]  Communication with PCP/Specialists, RN  Level of Care Adult Daycare, Location manager, Assisted Living, Holiday representative Personal Care Services  Exercise Interventions   Exercise Discussed/Reviewed  Exercise Discussed, Exercise Reviewed, Physical Activity, Assistive device use and maintanence  [Encouraged]  Physical Activity Discussed/Reviewed Physical Activity Discussed, Physical Activity Reviewed, Types of exercise, Home Exercise Program (HEP)  [Encouraged]  Education Interventions   Education Provided Provided Engineer, site, Provided Education, Provided Web-based Education  Provided Verbal Education On Nutrition, Foot Care, Eye Care, Blood Sugar Monitoring, Mental Health/Coping with Illness, Applications, Exercise, Medication, Personal assistant, [employer], When to see the doctor  [name] Discussed, Mental Health Reviewed, Coping Strategies, Crisis, Suicide, Substance Abuse, Grief and Loss, Depression, Anxiety  Refer to Social Work for resources regarding Adult Daycare, Assisted Living/Skilled Nursing Facility, Crisis, Mental Health  Nutrition Interventions   Nutrition Discussed/Reviewed Nutrition Discussed, Nutrition Reviewed, Adding fruits and vegetables, Fluid intake, Portion sizes, Decreasing sugar intake, Decreasing salt, Decreasing fats, Increaing proteins  [Encouraged]  Pharmacy Interventions   Pharmacy Dicussed/Reviewed Pharmacy Topics Discussed, Pharmacy Topics Reviewed, Medication Adherence, Affording Medications  [Encouraged]  Safety Interventions   Safety Discussed/Reviewed Safety Discussed, Safety Reviewed, Fall Risk, Home Safety  Home Safety Assistive Devices, Need for home safety assessment, Refer for community resources  Advanced Directive Interventions   Advanced Directives Discussed/Reviewed Advanced Directives Discussed     Assessed Social Determinant of Health Barriers. Discussed Plans for Ongoing Care Management Follow Up. Provided Tree surgeon Information for Care Management Team Members. Screened for Signs & Symptoms of Depression Related to  Chronic Disease State.  PHQ2 & PHQ9 Depression Screen Completed & Results Reviewed.  Suicidal Ideation & Homicidal Ideation Assessed - None Present.   Domestic Violence Assessed - None Present. Access to Weapons Assessed - None Present.   Active Listening & Reflection Utilized.  Verbalization of Feelings Encouraged.  Emotional Support Provided.  Feelings of Caregiver Burnout Validated. Caregiver Stress Acknowledged. Caregiver Resources Reviewed. Caregiver Support Groups Provided. Self-Enrollment in Caregiver Support Group of Interest Emphasized. Crisis Support Information, Agencies, Services & Resources Discussed. Problem Solving Interventions Identified. Task-Centered Solutions Implemented.   Solution-Focused Strategies Developed. Acceptance & Commitment Therapy Introduced. Brief Cognitive Behavioral Therapy Initiated. Client-Centered Therapy Enacted. Reviewed Prescription Medications & Discussed Importance of Compliance. Quality of Sleep Assessed & Sleep Hygiene Techniques Promoted. CSW Collaboration with [name], through [place] [phone]), to [name] Renewal Application Submitted for Processing. CSW Collaboration with [employer] [phone]), to Obtain the Following Information: ~ [employer] with [place] As a Armed forces operational officer, Providing a Architect to [place] for [employer] ~ [employer] Members with Ongoing Personal Care Needs By Working Directly with [place] Providers to [place] in The Home. ~ [place], through [place] [phone]) Will Be Reinstated on 10/26/2022. Please Outreach to Family Members & Friends to [employer] with Activities of Daily Living, Until [place]  Are Reinstated through [place] [phone]), Effective 10/26/2022.      COMPLETED: Reduce and Manage My Symptoms of Anxiety and Depression. (pt-stated)   On track     Care Coordination Interventions:  Active Listening & Reflection Utilized.  Verbalization of Feelings Encouraged. Emotional Support Provided. Feelings of Frustration Regarding Loss of Independence Validated. Caregiver Stress Acknowledged. Caregiver Resources Reviewed. Client-Centered Therapy Performed. Cognitive Behavioral Therapy Initiated. Acceptance & Commitment Therapy Indicated. Solution-Focused Strategies Activated. Problem-Solving Interventions Employed. Task-Centered Activities Implemented.      SDOH assessments and interventions completed:  Yes.  Care Coordination Interventions:  Yes, provided.   Follow up plan: Follow up call scheduled for 10/27/2022 at 9:30 am.  Encounter Outcome:  Pt. Visit Completed.   Nat Christen, BSW, MSW, LCSW  Licensed Education officer, environmental Health System  Mailing [place] [address], [place], [place] [address] Physical Address-[address], [place], [place] [address] Toll Free Main # [phone] Fax # [phone] Cell # [phone] Di [email or web address]

## 2022-09-28 NOTE — Patient Instructions (Signed)
Visit Information  Thank you for taking time to visit with me today. Please don't hesitate to contact me if I can be of assistance to you.   Following are the goals we discussed today:   Goals Addressed               This Visit's Progress     Receive Assistance [place]. (pt-stated)   On track     Care Coordination Interventions:  Interventions Today    Flowsheet Row Most Recent Value  Chronic Disease   Chronic disease during today's visit Hypertension (HTN), Diabetes, Chronic Kidney Disease/End Stage Renal Disease (ESRD), Other  [Chronic Fantom Limb Pain, Requires Assistance with Activities of Daily Living & Tobacco Dependence]  General Interventions   General Interventions Discussed/Reviewed General Interventions Discussed, General Interventions Reviewed, Annual Eye Exam, Labs, Annual Foot Exam, Durable Medical Equipment (DME), Vaccines, Health Screening, Community Resources, Level of Care, Communication with, Doctor Visits  [Primary Care Provider]  Labs Hgb A1c every 3 months, Kidney Function  [Encouraged]  Vaccines COVID-19, Flu, Pneumonia, RSV, Shingles, Tetanus/Pertussis/Diphtheria  [Encouraged]  Doctor Visits Discussed/Reviewed Doctor Visits Discussed, Doctor Visits Reviewed, Annual Wellness Visits, PCP, Specialist  [Encouraged]  Health Screening Colonoscopy, Mammogram  [Encouraged]  Durable Medical Equipment (DME) BP Cuff, Glucomoter, Oxygen, Environmental consultant, Community education officer  PCP/Specialist Visits Compliance with follow-up visit  [Encouraged]  Communication with PCP/Specialists, RN  Level of Care Adult Daycare, Location manager, Assisted Living, Holiday representative Personal Care Services  Exercise Interventions   Exercise Discussed/Reviewed Exercise Discussed, Exercise Reviewed, Physical Activity, Assistive device use and maintanence  [Encouraged]  Physical Activity Discussed/Reviewed Physical Activity Discussed, Physical  Activity Reviewed, Types of exercise, Home Exercise Program (HEP)  [Encouraged]  Education Interventions   Education Provided Provided Engineer, site, Provided Education, Provided Web-based Education  Provided Verbal Education On Nutrition, Foot Care, Eye Care, Blood Sugar Monitoring, Mental Health/Coping with Illness, Applications, Exercise, Medication, Personal assistant, [employer], When to see the doctor  [place] Discussed, Mental Health Reviewed, Coping Strategies, Crisis, Suicide, Substance Abuse, Grief and Loss, Depression, Anxiety  Refer to Social Work for resources regarding Adult Daycare, Assisted Living/Skilled Nursing Facility, Crisis, Mental Health  Nutrition Interventions   Nutrition Discussed/Reviewed Nutrition Discussed, Nutrition Reviewed, Adding fruits and vegetables, Fluid intake, Portion sizes, Decreasing sugar intake, Decreasing salt, Decreasing fats, Increaing proteins  [Encouraged]  Pharmacy Interventions   Pharmacy Dicussed/Reviewed Pharmacy Topics Discussed, Pharmacy Topics Reviewed, Medication Adherence, Affording Medications  [Encouraged]  Safety Interventions   Safety Discussed/Reviewed Safety Discussed, Safety Reviewed, Fall Risk, Home Safety  Home Safety Assistive Devices, Need for home safety assessment, Refer for community resources  Advanced Directive Interventions   Advanced Directives Discussed/Reviewed Advanced Directives Discussed     Assessed Social Determinant of Health Barriers. Discussed Plans for Ongoing Care Management Follow Up. Provided Tree surgeon Information for Care Management Team Members. Screened for Signs & Symptoms of Depression Related to Chronic Disease State.  PHQ2 & PHQ9 Depression Screen Completed & Results Reviewed.  Suicidal Ideation & Homicidal Ideation Assessed - None Present.   Domestic Violence Assessed - None  Present. Access to Weapons Assessed - None Present.   Active Listening & Reflection Utilized.  Verbalization of Feelings Encouraged.  Emotional Support Provided. Feelings of Caregiver Burnout Validated. Caregiver Stress Acknowledged. Caregiver Resources Reviewed. Caregiver Support Groups Provided. Self-Enrollment in Caregiver Support Group of Interest Emphasized. Crisis Support Information, Agencies, Community education officer  Discussed. Problem Solving Interventions Identified. Task-Centered Solutions Implemented.   Solution-Focused Strategies Developed. Acceptance & Commitment Therapy Introduced. Brief Cognitive Behavioral Therapy Initiated. Client-Centered Therapy Enacted. Reviewed Prescription Medications & Discussed Importance of Compliance. Quality of Sleep Assessed & Sleep Hygiene Techniques Promoted. CSW Collaboration with Medicaid Case Worker, through [place] [phone]), to [facility] Renewal Application Submitted for Processing. CSW Collaboration with [employer] [phone]), to Obtain the Following Information: ~ [employer] with [place] As a Armed forces operational officer, Providing a Architect to [place] for [employer] ~ [employer] Members with Ongoing Personal Care Needs By Working Directly with [place] Providers to [place] in The Home. ~ [place], through [place] [phone]) Will Be Reinstated on 10/26/2022. Please Outreach to Family Members & Friends to [employer] with Activities of Daily Living, Until [place] Are Reinstated through [place] [phone]), Effective 10/26/2022.      COMPLETED: Reduce and Manage My Symptoms of Anxiety and Depression. (pt-stated)   On track     Care  Coordination Interventions:  Active Listening & Reflection Utilized.  Verbalization of Feelings Encouraged. Emotional Support Provided. Feelings of Frustration Regarding Loss of Independence Validated. Caregiver Stress Acknowledged. Caregiver Resources Reviewed. Client-Centered Therapy Performed. Cognitive Behavioral Therapy Initiated. Acceptance & Commitment Therapy Indicated. Solution-Focused Strategies Activated. Problem-Solving Interventions Employed. Task-Centered Activities Implemented.      Our next appointment is by telephone on 10/27/2022 at 9:30 am.  Please call the care guide team at [phone] if you need to cancel or reschedule your appointment.   If you are experiencing a Mental Health or [place] or need someone to talk to, please call the Suicide and Crisis Lifeline: 988 call the [place] National Suicide Prevention Lifeline: 276-283-7576 or TTY: (579)056-9579 TTY (425) 857-6041) to talk to a trained counselor call 1-800-273-TALK (toll free, 24 hour hotline) go to [facility] [address] [place] [phone]) call the [place]: [phone] call 911  Patient verbalizes understanding of instructions and care plan provided today and agrees to view in [place]. Active MyChart status and patient understanding of how to access instructions and care plan via MyChart confirmed with patient.     Telephone follow up appointment with care management team member scheduled for:  10/27/2022 at 9:30 am.  Nat Christen, BSW, MSW, [place]  Licensed Clinical Social Worker  [place]  Mailing [place]. [address] [place], [place] [address] Physical Address-[address] [place], [place] [address] Toll Free Main # [phone] Fax # [phone] Cell # [phone] [email or web address]

## 2022-09-30 ENCOUNTER — Telehealth: Payer: Self-pay | Admitting: Nurse Practitioner

## 2022-09-30 NOTE — Telephone Encounter (Signed)
Patient called stating she has been only wearing her monitor for a week, and apparently hit has come loose.  It is blinking red.  She wants to know what she can do.

## 2022-09-30 NOTE — Telephone Encounter (Signed)
Left message for patient to call office to give number to call [employer] for any problems relating to her monitor [phone])

## 2022-10-02 NOTE — Telephone Encounter (Signed)
Advised to contact [employer] for a new monitor. Verbalized understanding of plan

## 2022-10-08 ENCOUNTER — Ambulatory Visit: Payer: Self-pay | Admitting: *Deleted

## 2022-10-08 NOTE — Patient Outreach (Signed)
  Care Coordination   10/08/2022 Name: [name] MRN: [id number] DOB: 1971-03-11   Care Coordination Outreach Attempts:  An unsuccessful telephone outreach was attempted for a scheduled appointment today.  Follow Up Plan:  Additional outreach attempts will be made to offer the patient care coordination information and services.   Encounter Outcome:  No Answer. Left HIPAA compliant VM   Care Coordination Interventions:  No, not indicated    Chong Sicilian, BSN, RN-BC RN Care Coordinator [place]: [phone] Main #: [phone]

## 2022-10-12 ENCOUNTER — Other Ambulatory Visit: Payer: Self-pay | Admitting: Nurse Practitioner

## 2022-10-12 DIAGNOSIS — I6523 Occlusion and stenosis of bilateral carotid arteries: Secondary | ICD-10-CM

## 2022-10-21 ENCOUNTER — Ambulatory Visit: Payer: Self-pay | Admitting: *Deleted

## 2022-10-21 ENCOUNTER — Ambulatory Visit: Payer: 59 | Admitting: Orthopaedic Surgery

## 2022-10-21 NOTE — Patient Outreach (Signed)
  Care Coordination   10/21/2022 Name: [name] MRN: [id number] DOB: [birth year]   Care Coordination Outreach Attempts:  An unsuccessful telephone outreach was attempted for a scheduled appointment today. #2  Follow Up Plan:  Additional outreach attempts will be made to offer the patient care coordination information and services.   Encounter Outcome:  No Answer. Left HIPAA compliant VM  Care Coordination Interventions:  No, not indicated    Chong Sicilian, BSN, RN-BC RN Care Coordinator [place]: [phone] Main #: [phone]

## 2022-10-22 ENCOUNTER — Other Ambulatory Visit (INDEPENDENT_AMBULATORY_CARE_PROVIDER_SITE_OTHER): Payer: 59

## 2022-10-22 ENCOUNTER — Encounter: Payer: Self-pay | Admitting: Orthopaedic Surgery

## 2022-10-22 ENCOUNTER — Ambulatory Visit (INDEPENDENT_AMBULATORY_CARE_PROVIDER_SITE_OTHER): Payer: 59 | Admitting: Orthopaedic Surgery

## 2022-10-22 VITALS — Wt 191.0 lb

## 2022-10-22 DIAGNOSIS — M7542 Impingement syndrome of left shoulder: Secondary | ICD-10-CM

## 2022-10-22 DIAGNOSIS — M25512 Pain in left shoulder: Secondary | ICD-10-CM | POA: Diagnosis not present

## 2022-10-22 DIAGNOSIS — M542 Cervicalgia: Secondary | ICD-10-CM

## 2022-10-22 MED ORDER — METHYLPREDNISOLONE ACETATE 40 MG/ML IJ SUSP
40.0000 mg | INTRAMUSCULAR | Status: AC | PRN
  Administered 2022-10-22: 40 mg via INTRA_ARTICULAR

## 2022-10-22 MED ORDER — LIDOCAINE HCL 1 % IJ SOLN
0.5000 mL | INTRAMUSCULAR | Status: AC | PRN
  Administered 2022-10-22: .5 mL

## 2022-10-22 MED ORDER — BUPIVACAINE HCL 0.25 % IJ SOLN
4.0000 mL | INTRAMUSCULAR | Status: AC | PRN
  Administered 2022-10-22: 4 mL via INTRA_ARTICULAR

## 2022-10-22 NOTE — Progress Notes (Signed)
Office Visit Note   Patient: [name]           Date of Birth: [birth year]           MRN: [id number] Visit Date: 10/22/2022              Requested by: Rosalee Kaufman, PA-C [place],  [place] [address] PCP: Rosalee Kaufman, PA-C   Assessment & Plan: Visit Diagnoses:  1. Neck pain   2. Acute pain of left shoulder     Plan: Proceed with left subacromial injection we discussed staying off her coccyx is much as she can.  She tolerated the injection well and she can let us know if she has ongoing problems.  We discussed that if rotator cuff surgery was needed she did have a difficult time since she has to use her arms for transfers with bilateral AKA's.  Follow-Up Instructions: No follow-ups on file.   Orders:  Orders Placed This Encounter  Procedures   XR Cervical Spine 2 or 3 views   XR Shoulder Left   No orders of the defined types were placed in this encounter.     Procedures: Large Joint Inj: L subacromial bursa on 10/22/2022 10:18 AM Indications: pain Details: 22 G 1.5 in needle, lateral approach  Arthrogram: No  Medications: 4 mL bupivacaine 0.25 %; 40 mg methylPREDNISolone acetate 40 MG/ML; 0.5 mL lidocaine 1 % Outcome: tolerated well, no immediate complications Procedure, treatment alternatives, risks and benefits explained, specific risks discussed. Consent was given by the patient. Immediately prior to procedure a time out was called to verify the correct patient, procedure, equipment, support staff and site/side marked as required. Patient was prepped and draped in the usual sterile fashion.       Clinical Data: No additional findings.   Subjective: Chief Complaint  Patient presents with   Neck - Pain   Left Arm - Pain    HPI 52 year old female PAD, diabetic on insulin, long-term smoker still smoking with bilateral above-knee amputations after multiple revascularization attempts failure, BKA followed by bilateral AKA's.  She is  here with left shoulder pain.  She has some irritation over her coccyx from using a wheelchair.  Uses her arms for transfers.  Right-hand-dominant and pain is in left shoulder pain with outstretched and overhead activity.  Patient has some associated neck pain.  States she still has phantom sensations in her feet.  Review of Systems all systems noncontributory HPI.   Objective: Vital Signs: Wt 191 lb (86.6 kg)   BMI 30.83 kg/m   Physical Exam Constitutional:      Appearance: She is well-developed.  HENT:     Head: Normocephalic.     Right Ear: External ear normal.     Left Ear: External ear normal. There is no impacted cerumen.  Eyes:     Pupils: Pupils are equal, round, and reactive to light.  Neck:     Thyroid: No thyromegaly.     Trachea: No tracheal deviation.  Cardiovascular:     Rate and Rhythm: Normal rate.  Pulmonary:     Effort: Pulmonary effort is normal.  Abdominal:     Palpations: Abdomen is soft.  Musculoskeletal:     Cervical back: No rigidity.  Skin:    General: Skin is warm and dry.  Neurological:     Mental Status: She is alert and oriented to person, place, and time.  Psychiatric:        Behavior: Behavior  normal.     Ortho Exam Long head of the biceps slightly more tender left than right positive impingement negative drop arm test.  No subluxation of the shoulder minimal brachial plexus tenderness.  Reflexes are symmetrical.  Decreased biceps triceps wrist flexion extension and grip strength which is symmetrical.  Specialty Comments:  No specialty comments available.  Imaging: No results found.   PMFS History: Patient Active Problem List   Diagnosis Date Noted   Debility 10/01/2021   CKD (chronic kidney disease) stage 3, GFR 30-59 ml/min (HCC) 09/25/2021   Hyponatremia 09/25/2021   Leukocytosis 09/25/2021   PAD (peripheral artery disease) ([place]) 09/24/2021   COPD not affecting current episode of care 09/24/2021   Tobacco dependence 09/24/2021    Nausea with vomiting 03/12/2016   Dysphagia, pharyngoesophageal phase 08/24/2014   Constipation 04/20/2014   Liver abscess 02/21/2014   RUQ pain 01/31/2014   Septic shock(785.52) 02/05/2012   Hypokalemia 02/05/2012   Normocytic anemia 02/05/2012   Acute respiratory failure with hypoxia (HCC) 01/28/2012   Appendicitis 01/28/2012   ARDS (adult respiratory distress syndrome) ([place]) 01/28/2012   Diabetes mellitus ([place]) 01/28/2012   Asthma 01/28/2012   Hypertension 01/28/2012   Past Medical History:  Diagnosis Date   Anemia 02/05/2012   Anxiety    Arthritis    Asthma    CKD (chronic kidney disease) stage 3, GFR 30-59 ml/min (HCC) 09/25/2021   COPD (chronic obstructive pulmonary disease) (HCC)    Depression    Essential hypertension    Fatty liver    GERD (gastroesophageal reflux disease)    Headache(784.0)    PAD (peripheral artery disease) (HCC)    Status post bilateral AKA   PE (pulmonary embolism)    Xarelto   Pneumonia 2013   Sepsis ([place])    Type 2 diabetes mellitus ([place])     Family History  Problem Relation Age of Onset   Breast cancer Mother    Kidney disease Sister    Diabetes Sister    Diabetes Brother    Colon cancer Neg Hx    Liver disease Neg Hx     Past Surgical History:  Procedure Laterality Date   ABDOMINAL AORTOGRAM W/LOWER EXTREMITY N/A 09/05/2021   Procedure: ABDOMINAL AORTOGRAM W/LOWER EXTREMITY;  Surgeon: Cherre Robins, MD;  Location: [facility];  Service: Cardiovascular;  Laterality: N/A;   AMPUTATION Right 09/24/2021   Procedure: RIGHT ABOVE KNEE AMPUTATION;  Surgeon: Cherre Robins, MD;  Location: [facility];  Service: Vascular;  Laterality: Right;   APPENDECTOMY     CHOLECYSTECTOMY     DIAGNOSTIC LAPAROSCOPY     ESOPHAGEAL DILATION N/A 10/09/2014   Procedure: ESOPHAGEAL DILATION;  Surgeon: Danie Binder, MD;  Location: [facility];  Service: Endoscopy;  Laterality: N/A;  #15 and #16 savory   ESOPHAGOGASTRODUODENOSCOPY (EGD) WITH PROPOFOL N/A  10/09/2014   Dr. Oneida Alar: mild erosive gastritis, Savary dilation. Nausea and vomiting most likely due to uncontroleld blood sugars, reflux, and gastritis.    Social History   Occupational History   Not on file  Tobacco Use   Smoking status: Every Day    Packs/day: 1.00    Years: 20.00    Additional pack years: 0.00    Total pack years: 20.00    Types: Cigarettes    Passive exposure: Current   Smokeless tobacco: Never  Vaping Use   Vaping Use: Former  Substance and Sexual Activity   Alcohol use: No   Drug use: No   Sexual activity:  Yes    Birth control/protection: Injection

## 2022-10-27 ENCOUNTER — Ambulatory Visit: Payer: Self-pay | Admitting: *Deleted

## 2022-10-27 ENCOUNTER — Encounter: Payer: Self-pay | Admitting: *Deleted

## 2022-10-27 NOTE — Patient Outreach (Signed)
Care Coordination   Follow Up Visit Note   10/27/2022  Name: [name] MRN: [id number] DOB: October 26, 1970  [name] is a 52 y.o. year old female who sees [name], [name], [name] for primary care. I spoke with Estell Harpin by phone today.  What matters to the patients health and wellness today?   Receive Assistance Reinstating Personal Care Services.   Goals Addressed               This Visit's Progress     COMPLETED: Receive Assistance [place]. (pt-stated)   On track     Care Coordination Interventions:  Interventions Today    Flowsheet Row Most Recent Value  Chronic Disease   Chronic disease during today's visit Hypertension (HTN), Diabetes, Chronic Kidney Disease/End Stage Renal Disease (ESRD), Other  [Chronic Fantom Limb Pain, Requires Assistance with Activities of Daily Living & Tobacco Dependence]  General Interventions   General Interventions Discussed/Reviewed General Interventions Discussed, General Interventions Reviewed, Annual Eye Exam, Labs, Annual Foot Exam, Durable Medical Equipment (DME), Vaccines, Health Screening, Community Resources, Level of Care, Communication with, Doctor Visits  [Primary Care Provider]  Labs Hgb A1c every 3 months, Kidney Function  [Encouraged]  Vaccines COVID-19, Flu, Pneumonia, RSV, Shingles, Tetanus/Pertussis/Diphtheria  [Encouraged]  Doctor Visits Discussed/Reviewed Doctor Visits Discussed, Doctor Visits Reviewed, Annual Wellness Visits, PCP, Specialist  [Encouraged]  Health Screening Colonoscopy, Mammogram  [Encouraged]  Durable Medical Equipment (DME) BP Cuff, Glucomoter, Oxygen, Environmental consultant, Community education officer  PCP/Specialist Visits Compliance with follow-up visit  [Encouraged]  Communication with PCP/Specialists, RN  Level of Care Adult Daycare, Location manager, Assisted Living, Holiday representative Personal Care Services  Exercise Interventions   Exercise  Discussed/Reviewed Exercise Discussed, Exercise Reviewed, Physical Activity, Assistive device use and maintanence  [Encouraged]  Physical Activity Discussed/Reviewed Physical Activity Discussed, Physical Activity Reviewed, Types of exercise, Home Exercise Program (HEP)  [Encouraged]  Education Interventions   Education Provided Provided Engineer, site, Provided Education, Provided Web-based Education  Provided Verbal Education On Nutrition, Foot Care, Eye Care, Blood Sugar Monitoring, Mental Health/Coping with Illness, Applications, Exercise, Medication, Personal assistant, [employer], When to see the doctor  [place] Discussed, Mental Health Reviewed, Coping Strategies, Crisis, Suicide, Substance Abuse, Grief and Loss, Depression, Anxiety  Refer to Social Work for resources regarding Adult Daycare, Assisted Living/Skilled Nursing Facility, Crisis, Mental Health  Nutrition Interventions   Nutrition Discussed/Reviewed Nutrition Discussed, Nutrition Reviewed, Adding fruits and vegetables, Fluid intake, Portion sizes, Decreasing sugar intake, Decreasing salt, Decreasing fats, Increaing proteins  [Encouraged]  Pharmacy Interventions   Pharmacy Dicussed/Reviewed Pharmacy Topics Discussed, Pharmacy Topics Reviewed, Medication Adherence, Affording Medications  [Encouraged]  Safety Interventions   Safety Discussed/Reviewed Safety Discussed, Safety Reviewed, Fall Risk, Home Safety  Home Safety Assistive Devices, Need for home safety assessment, Refer for community resources  Advanced Directive Interventions   Advanced Directives Discussed/Reviewed Advanced Directives Discussed     Active Listening & Reflection Utilized.  Verbalization of Feelings Encouraged.  Emotional Support Provided. Feelings of Caregiver Burnout Validated. Caregiver Stress Acknowledged. Caregiver Resources  Reviewed. Caregiver Support Groups Provided. Self-Enrollment in Caregiver Support Group of Interest Emphasized. Crisis Support Information, Agencies, [place]. Problem Solving Interventions Activated. Task-Centered Solutions Implemented.   Solution-Focused Strategies Indicated. Acceptance & Commitment Therapy Employed.. Cognitive Behavioral Therapy Initiated. Client-Centered Therapy Performed. CSW Collaboration with Medicaid Case Worker, through [place] (#  [phone]), to Confirm Approval for Full Adult Medicaid Benefits & Coverage. CSW Collaboration with [employer] [phone]), to [employer] for [employer], through [facility] [phone]). CSW Environmental education officer from [employer] [phone]), to Confirm Approval of 26 Hours of [place] in The Home Per Week. Congratulations on Approval for Motorized Wheelchair to Assist with Programmer, multimedia. Please Contact CSW Directly (# [phone]), if You Have Questions, Need Assistance, or If Additional Social Work Needs Are Identified in The Near Future.      SDOH assessments and interventions completed:  Yes.  Care Coordination Interventions:  Yes, provided.   Follow up plan: No further intervention required.   Encounter Outcome:  Pt. Visit Completed.   Nat Christen, BSW, MSW, LCSW  Licensed Education officer, environmental Health System  Mailing [place] [address], [place], [place] [address] Physical Address-[address], [place], [place] [address] Toll Free Main # [phone] Fax # [phone] Cell # [phone] [email or web address]

## 2022-10-27 NOTE — Patient Instructions (Signed)
Visit Information  Thank you for taking time to visit with me today. Please don't hesitate to contact me if I can be of assistance to you.   Following are the goals we discussed today:   Goals Addressed               This Visit's Progress     COMPLETED: Receive Assistance [place]. (pt-stated)   On track     Care Coordination Interventions:  Interventions Today    Flowsheet Row Most Recent Value  Chronic Disease   Chronic disease during today's visit Hypertension (HTN), Diabetes, Chronic Kidney Disease/End Stage Renal Disease (ESRD), Other  [Chronic Fantom Limb Pain, Requires Assistance with Activities of Daily Living & Tobacco Dependence]  General Interventions   General Interventions Discussed/Reviewed General Interventions Discussed, General Interventions Reviewed, Annual Eye Exam, Labs, Annual Foot Exam, Durable Medical Equipment (DME), Vaccines, Health Screening, Community Resources, Level of Care, Communication with, Doctor Visits  [Primary Care Provider]  Labs Hgb A1c every 3 months, Kidney Function  [Encouraged]  Vaccines COVID-19, Flu, Pneumonia, RSV, Shingles, Tetanus/Pertussis/Diphtheria  [Encouraged]  Doctor Visits Discussed/Reviewed Doctor Visits Discussed, Doctor Visits Reviewed, Annual Wellness Visits, PCP, Specialist  [Encouraged]  Health Screening Colonoscopy, Mammogram  [Encouraged]  Durable Medical Equipment (DME) BP Cuff, Glucomoter, Oxygen, Environmental consultant, Community education officer  PCP/Specialist Visits Compliance with follow-up visit  [Encouraged]  Communication with PCP/Specialists, RN  Level of Care Adult Daycare, Location manager, Assisted Living, Holiday representative Personal Care Services  Exercise Interventions   Exercise Discussed/Reviewed Exercise Discussed, Exercise Reviewed, Physical Activity, Assistive device use and maintanence  [Encouraged]  Physical Activity Discussed/Reviewed Physical Activity Discussed,  Physical Activity Reviewed, Types of exercise, Home Exercise Program (HEP)  [Encouraged]  Education Interventions   Education Provided Provided Engineer, site, Provided Education, Provided Web-based Education  Provided Verbal Education On Nutrition, Foot Care, Eye Care, Blood Sugar Monitoring, Mental Health/Coping with Illness, Applications, Exercise, Medication, Personal assistant, [employer], When to see the doctor  [place] Discussed, Mental Health Reviewed, Coping Strategies, Crisis, Suicide, Substance Abuse, Grief and Loss, Depression, Anxiety  Refer to Social Work for resources regarding Adult Daycare, Assisted Living/Skilled Nursing Facility, Crisis, Mental Health  Nutrition Interventions   Nutrition Discussed/Reviewed Nutrition Discussed, Nutrition Reviewed, Adding fruits and vegetables, Fluid intake, Portion sizes, Decreasing sugar intake, Decreasing salt, Decreasing fats, Increaing proteins  [Encouraged]  Pharmacy Interventions   Pharmacy Dicussed/Reviewed Pharmacy Topics Discussed, Pharmacy Topics Reviewed, Medication Adherence, Affording Medications  [Encouraged]  Safety Interventions   Safety Discussed/Reviewed Safety Discussed, Safety Reviewed, Fall Risk, Home Safety  Home Safety Assistive Devices, Need for home safety assessment, Refer for community resources  Advanced Directive Interventions   Advanced Directives Discussed/Reviewed Advanced Directives Discussed     Active Listening & Reflection Utilized.  Verbalization of Feelings Encouraged.  Emotional Support Provided. Feelings of Caregiver Burnout Validated. Caregiver Stress Acknowledged. Caregiver Resources Reviewed. Caregiver Support Groups Provided. Self-Enrollment in Caregiver Support Group of Interest Emphasized. Crisis Support Information, Agencies, [place]. Problem Solving  Interventions Activated. Task-Centered Solutions Implemented.   Solution-Focused Strategies Indicated. Acceptance & Commitment Therapy Employed.. Cognitive Behavioral Therapy Initiated. Client-Centered Therapy Performed. CSW Collaboration with Medicaid Case Worker, through [place] [phone]), to Confirm Approval for Full Adult Medicaid Benefits & Coverage. CSW Collaboration with [employer] [phone]), to [employer] for [employer], through [facility] [phone]).  CSW Environmental education officer from [employer] [phone]), to Confirm Approval of 26 Hours of [place] in The Home Per Week. Congratulations on Approval for Motorized Wheelchair to Assist with Programmer, multimedia. Please Contact CSW Directly (# [phone]), if You Have Questions, Need Assistance, or If Additional Social Work Needs Are Identified in The Near Future.      Please call the care guide team at [phone] if you need to cancel or reschedule your appointment.   If you are experiencing a Mental Health or [place] or need someone to talk to, please call the Suicide and Crisis Lifeline: 988 call the [place] National Suicide Prevention Lifeline: (279) 598-2925 or TTY: 901-862-3253 TTY 219 170 1431) to talk to a trained counselor call 1-800-273-TALK (toll free, 24 hour hotline) go to [facility] [address] [place] [phone]) call the [place]: [phone] call 911  Patient verbalizes understanding of instructions and care plan provided today and agrees to view in [place]. Active MyChart status and patient understanding of how to access instructions and care plan via MyChart confirmed with patient.     No further follow up required.  Nat Christen, BSW, MSW, LCSW  Licensed Regulatory affairs officer Health System  Mailing [place] [address] [place], [place] [address] Physical Address-[address] [place], [place] [address] Toll Free Main # [phone] Fax # [phone] Cell # [phone] [email or web address]

## 2022-10-28 ENCOUNTER — Ambulatory Visit: Payer: Self-pay | Admitting: *Deleted

## 2022-10-28 ENCOUNTER — Encounter: Payer: Self-pay | Admitting: *Deleted

## 2022-10-28 NOTE — Patient Outreach (Signed)
Care Coordination   Follow Up Visit Note   10/28/2022 Name: [name] MRN: [id number] DOB: [birth year]  [name] is a 52 y.o. year old female who sees [place], [name], [place] for primary care. I spoke with  [name] by phone today.  What matters to the patients health and wellness today?  Managing blood sugar, pressure wound healing, chronic pain management    Goals Addressed               This Visit's Progress     Patient Stated     Management of DM, decrease A1C (pt-stated)   Not on track     Care Coordination Goals: Patient will follow-up with PCP and/or endocrinologist every 3 months or as recommended PCP visit scheduled for 10/30/22 Patient will take medication as prescribed and reach out to provider with any negative side effects Lantus 36 units BID Patient will monitor and record blood sugar 3 times per day and as needed with glucometer, and will call PCP or endocrinologist with any readings outside of recommended range Patient will take blood sugar log and meter to provider visits for review Patient will follow a modified carbohydrate diet and decrease simple carbohydrates and sugars Review educational materials that were mailed Cut out soda, sugar, and artificial sweeteners Eat balanced meals with healthy fats, proteins, fruits, and vegetables Patient will have yearly eye exams to check for or monitor diabetic retinopathy Patient will reach out to [name] Coordinator [phone] with any care coordination or resource needs        Other     Care Coordination Services   On track     Care Coordination Goals: Patient will continue to f/u with [place] (# [phone]) regarding when approved 26 Hours of [place] will begin. Medicaid was reinstated on 10/26/22. Patient will reach out to [facility] Coordinator [phone] with any care coordination or resource needs      Manage Blood Pressure   On track     Care Coordination  Goals: Patient will take medication as directed Patient will keep follow-up appointments with PCP and specialists Patient will monitor and record blood pressure daily and as needed Patient will call PCP/cardio with any readings outside of recommended range Patient will follow a low sodium/heart healthy diet Patient will call RN Care Coordinator with any care coordination or resource needs      Pain Management   On track     Care Coordination Interventions: Patient will keep f/u with PCP 10/30/22 Patient will discuss depression and pain symptoms with PCP.  Cymbalta may be a good option for her because of it's indication for both depression and some types of pain management Patient will talk with PCP about possibly increasing gabapentin dosage since she hasn't been accepted into a pain management clinic Patient will practice good sleep hygiene to improve sleep quantity and quality Patient will reach out to ortho, Dr [name], with any new, worsening, or persistent shoulder pain Patient will reach out to Care Coordination  Team as needed      Resolution of Pressure Wound   Not on track     Care Coordination Goals: Patient will change positions at least every 2 hours and try to stay off of her coccyx as much as possible Patient will use a gel cushion with a coccyx cutout in her wheelchair. In the meantime, can use a rolled up towel on each side to elevate her off of her coccyx.  Patient will use pillows to position in bed to relieve pressure on coccyx Patient will continue to apply a barrier cream to the wound and will keep it clean and dry Patient will have PCP evaluate at appt on 10/30/22 and discuss potential need for home health nursing services for wound care Patient will reach out to [place] Coordinator [phone] with any care coordination or resource needs        SDOH assessments and interventions completed:  Yes  SDOH Interventions Today    Flowsheet Row Most Recent Value  SDOH  Interventions   Transportation Interventions Intervention Not Indicated  Physical Activity Interventions Other (Comments)  [limited by Bil AKA and shoulder pain and decreased range of motion]        Care Coordination Interventions:  Yes, provided  Interventions Today    Flowsheet Row Most Recent Value  Chronic Disease   Chronic disease during today's visit Diabetes, Hypertension (HTN), Other  [Chronic Pain, Bilateral AKA, Pressure Wound]  General Interventions   General Interventions Discussed/Reviewed General Interventions Discussed, General Interventions Reviewed, Labs, Durable Medical Equipment (DME), [employer], Doctor Visits, Communication with, Level of Care  Labs Hgb A1c every 3 months  Doctor Visits Discussed/Reviewed Doctor Visits Discussed, Doctor Visits Reviewed, PCP, Specialist  Durable Medical Equipment (DME) Glucomoter, BP Cuff, Community education officer, Motorized  PCP/Specialist Visits Compliance with follow-up visit  Communication with PCP/Specialists  [Forwarding today's note to PCP via secure fax through [place]  [facility] approved for 26 hrs per week. Pending assessment and assignment. Patient has contact number and will f/u with them]  Exercise Interventions   Exercise Discussed/Reviewed Physical Activity  Physical Activity Discussed/Reviewed Physical Activity Discussed, Physical Activity Reviewed  Education Interventions   Education Provided Provided Education, Provided Printed Education  Provided Verbal Education On Nutrition, Labs, Blood Sugar Monitoring, Mental Health/Coping with Illness, Medication, When to see the doctor, [employer] blood sugar once or twice per day. Advised to check at least twice per day prior to insulin administration. Reports CBGS are often >200 and sometimes >300. Admits to drinking 4-5 cans of soda per day. Doesn't eat very much.]  Labs Reviewed Hgb A1c  Nutrition  Interventions   Nutrition Discussed/Reviewed Nutrition Discussed, Nutrition Reviewed, Carbohydrate meal planning, Decreasing sugar intake, Increaing proteins  Pharmacy Interventions   Pharmacy Dicussed/Reviewed Medications and their functions  Safety Interventions   Safety Discussed/Reviewed Safety Discussed, Home Safety  Home Safety Assistive Devices       Follow up plan: Follow up call scheduled for 11/03/22    Encounter Outcome:  Pt. Visit Completed   Chong Sicilian, BSN, RN-BC RN Care Coordinator [place]: [phone] Main #: [phone]

## 2022-11-03 ENCOUNTER — Ambulatory Visit: Payer: Self-pay | Admitting: *Deleted

## 2022-11-03 ENCOUNTER — Encounter: Payer: Self-pay | Admitting: *Deleted

## 2022-11-03 NOTE — Patient Outreach (Signed)
Care Coordination   Follow Up Visit Note   11/03/2022 Name: [name] MRN: [id number] DOB: 10-22-1970  [name] is a 52 y.o. year old female who sees [name], [name], [place] for primary care. I spoke with  [name] by phone today.  What matters to the patients health and wellness today?  Sacral wound, obtaining PCS     Goals Addressed               This Visit's Progress     Patient Stated     Management of DM, decrease A1C (pt-stated)   On track     Care Coordination Goals: Patient will follow-up with PCP and/or endocrinologist every 3 months or as recommended Patient will take medication as prescribed and reach out to provider with any negative side effects Lantus 36 units BID ozempic was increased to 1mg  weekly Patient will monitor and record blood sugar 3 times per day and as needed with glucometer, and will call PCP or endocrinologist with any readings outside of recommended range Patient will take blood sugar log and meter to provider visits for review Patient will follow a modified carbohydrate diet and decrease simple carbohydrates and sugars Review educational materials that were mailed Cut out soda, sugar, and artificial sweeteners Eat balanced meals with healthy fats, proteins, fruits, and vegetables Patient will have yearly eye exams to check for or monitor diabetic retinopathy Patient will reach out to RN Care Coordinator [phone] with any care coordination or resource needs        Other     Care Coordination Services   Not on track     Care Coordination Goals: Patient will continue to f/u with [facility] [phone]) regarding when approved 26 Hours of Personal Care Services will begin. Medicaid was reinstated on 10/26/22. Patient will reach out to RN Care Coordinator [phone] or LCSW with any care coordination or resource needs      Resolution of Pressure Wound   On track     Care Coordination Goals: Patient will  change positions at least every 2 hours and try to stay off of her coccyx as much as possible Patient will use a gel cushion with a coccyx cutout in her wheelchair. In the meantime, can use a rolled up towel on each side to elevate her off of her coccyx.  Patient will use pillows to position in bed to relieve pressure on coccyx Patient/sister will continue to apply a barrier cream to the wound and will keep it clean and dry Patient will reach out to RN Care Coordinator [phone] with any care coordination or resource needs        SDOH assessments and interventions completed:  Yes  SDOH Interventions Today    Flowsheet Row Most Recent Value  SDOH Interventions   Transportation Interventions Intervention Not Indicated  Financial Strain Interventions Intervention Not Indicated        Care Coordination Interventions:  Yes, provided  Interventions Today    Flowsheet Row Most Recent Value  Chronic Disease   Chronic disease during today's visit Diabetes, Other  [Inability to perform ADLs independently, Sacral wound]  General Interventions   General Interventions Discussed/Reviewed General Interventions Discussed, General Interventions Reviewed, Labs, Durable Medical Equipment (DME), Doctor Visits  Doctor Visits Discussed/Reviewed Doctor Visits Discussed, Doctor Visits Reviewed, PCP  Durable Medical Equipment (DME) Wheelchair  [Needs gel cushion with coccyx cutout for wheelchair for sacral wound]  Wheelchair Standard, Motorized  PCP/Specialist Visits Compliance with follow-up visit  Level of Care Personal Care Services  [Has been in touch with PCS provider. They have not assigned her an aide yet.]  Education Interventions   Education Provided Provided Education  Provided Verbal Education On When to see the doctor, Mental Health/Coping with Illness, Other  [wound care]       Follow up plan: Follow up call scheduled for 12/04/22    Encounter Outcome:  Pt. Visit Completed    Demetrios Loll, BSN, RN-BC RN Care Coordinator [facility]  [facility] Direct Dial: [phone] Main #: [phone]

## 2022-11-17 ENCOUNTER — Telehealth: Payer: Self-pay

## 2022-11-17 DIAGNOSIS — Z9289 Personal history of other medical treatment: Secondary | ICD-10-CM

## 2022-11-17 NOTE — Telephone Encounter (Signed)
-----   Message from Sharlene Dory, NP sent at 11/17/2022 10:16 AM EDT ----- Monitor reviewed.   Overall she is in normal rhythm, rare PAC's (early beats). Has known history of frequent PVC's and symptomatic with this. I previously discussed weaning off caffeine at prior OV. Limited with medical therapy d/t her BP and hx of fatigue.   No sustained episodes of high degree heart block or pauses. Please refer to EP to discuss other options, may want to discuss ablation.   Thanks!  Sharlene Dory, AGNP-C

## 2022-12-01 ENCOUNTER — Encounter: Payer: Self-pay | Admitting: Nurse Practitioner

## 2022-12-01 ENCOUNTER — Ambulatory Visit: Payer: 59 | Attending: Nurse Practitioner | Admitting: Nurse Practitioner

## 2022-12-01 VITALS — BP 124/70 | HR 81 | Wt 191.0 lb

## 2022-12-01 DIAGNOSIS — R0989 Other specified symptoms and signs involving the circulatory and respiratory systems: Secondary | ICD-10-CM

## 2022-12-01 DIAGNOSIS — M79602 Pain in left arm: Secondary | ICD-10-CM

## 2022-12-01 DIAGNOSIS — M542 Cervicalgia: Secondary | ICD-10-CM

## 2022-12-01 DIAGNOSIS — R5382 Chronic fatigue, unspecified: Secondary | ICD-10-CM

## 2022-12-01 DIAGNOSIS — I739 Peripheral vascular disease, unspecified: Secondary | ICD-10-CM

## 2022-12-01 DIAGNOSIS — I493 Ventricular premature depolarization: Secondary | ICD-10-CM | POA: Diagnosis not present

## 2022-12-01 DIAGNOSIS — I5032 Chronic diastolic (congestive) heart failure: Secondary | ICD-10-CM | POA: Diagnosis not present

## 2022-12-01 DIAGNOSIS — Z720 Tobacco use: Secondary | ICD-10-CM

## 2022-12-01 DIAGNOSIS — I10 Essential (primary) hypertension: Secondary | ICD-10-CM

## 2022-12-01 DIAGNOSIS — R002 Palpitations: Secondary | ICD-10-CM

## 2022-12-01 DIAGNOSIS — E785 Hyperlipidemia, unspecified: Secondary | ICD-10-CM

## 2022-12-01 DIAGNOSIS — I6523 Occlusion and stenosis of bilateral carotid arteries: Secondary | ICD-10-CM

## 2022-12-01 NOTE — Patient Instructions (Addendum)
Medication Instructions:   Continue all current medications.   Labwork:  none  Testing/Procedures:  none  Follow-Up:  6 months   Any Other Special Instructions Will Be Listed Below (If Applicable). Heart healthy diet  Salty six   If you need a refill on your cardiac medications before your next appointment, please call your pharmacy.

## 2022-12-01 NOTE — Progress Notes (Signed)
Cardiology Office Note:    Date:  12/01/2022   ID:  [name], DOB [birth year], MRN [id number]  PCP:  [name]   [facility] Providers Cardiologist:  Nona Dell, MD     Referring MD: Royann Shivers, *   CC: Here for follow-up   History of Present Illness:    [name] is a 52 y.o. female with a hx of the following:  HFmrEF Frequent PVC's, hx of palpitations HTN HLD PAD, s/p bilateral AKA Asthma CKD stage 3 T2DM Tobacco abuse Pericardial effusion  Patient is a 52 year old female with past medical history as mentioned above.  Follows [facility] for history of early onset of peripheral vascular occlusive disease, history of multiple bilateral procedures in [place]. She is now s/p bilateral AKA and continues to use tobacco.   Was referred to cardiology services in July 2023 and saw Dr. Diona Browner for the first time on January 29, 2022. CC of palpitations and documented PVCs.  EKG revealed sinus rhythm, frequent PVCs/ PACs.  TTE in 2013 revealed EF 65 to 70%.  72 hour Zio patch revealed sinus rhythm, rare PACs, frequent PVCs, multiple brief bursts of NSVT, no pauses or other sustained ventricular arrhythmias.  Updated TTE revealed EF reduced at 40%, small pericardial effusion noted that was circumferential, without evidence of cardiac tamponade, mild to moderate MR, mild calcification of aortic valve, mild AR. Carvedilol switched to Toprol XL 25 mg daily.   07/2022 - presented with multiple concerns. Admitted to [facility] soreness from pushing wheelchair. Noted CP at times, intermittent and chronic per her report. Located along lower chest/epigastric area, mid with radiation to shoulder at times. Associated shortness of breath. Noted fatigue and poor appetite, sometimes felt lightheaded, endorsed occasional palpitations. NST arranged and was negative. Carotid doppler showed mild R ICA stenosis and moderate L ICA stenosis.   12/01/2022 - Presents for  follow-up. Continues to note palpitations, fatigue, and poor appetite.  Denies any chest pain, shortness of breath, syncope, presyncope, dizziness, orthopnea, PND, swelling or significant weight changes, acute bleeding, or claudication. Notes MSK pain along left neck/arm, saw Ortho and received injection, helped per her report, says she sleeps on this side.   Past Medical History:  Diagnosis Date   Anemia 02/05/2012   Anxiety    Arthritis    Asthma    CKD (chronic kidney disease) stage 3, GFR 30-59 ml/min (HCC) 09/25/2021   COPD (chronic obstructive pulmonary disease) (HCC)    Depression    Essential hypertension    Fatty liver    GERD (gastroesophageal reflux disease)    Headache(784.0)    PAD (peripheral artery disease) (HCC)    Status post bilateral AKA   PE (pulmonary embolism)    Xarelto   Pneumonia 2013   Sepsis (HCC)    Type 2 diabetes mellitus (HCC)     Past Surgical History:  Procedure Laterality Date   ABDOMINAL AORTOGRAM W/LOWER EXTREMITY N/A 09/05/2021   Procedure: ABDOMINAL AORTOGRAM W/LOWER EXTREMITY;  Surgeon: [name], MD;  Location: [facility];  Service: Cardiovascular;  Laterality: N/A;   AMPUTATION Right 09/24/2021   Procedure: RIGHT ABOVE KNEE AMPUTATION;  Surgeon: [name], MD;  Location[facility];  Service: Vascular;  Laterality: Right;   APPENDECTOMY     CHOLECYSTECTOMY     DIAGNOSTIC LAPAROSCOPY     ESOPHAGEAL DILATION N/A 10/09/2014   Procedure: ESOPHAGEAL DILATION;  Surgeon: West Bali, MD;  Location: [facility]S;  Service:  Endoscopy;  Laterality: N/A;  #15 and #16 savory   ESOPHAGOGASTRODUODENOSCOPY (EGD) WITH PROPOFOL N/A 10/09/2014   Dr. Darrick Penna: mild erosive gastritis, Savary dilation. Nausea and vomiting most likely due to uncontroleld blood sugars, reflux, and gastritis.    Current Meds  Medication Sig   ALPRAZolam (XANAX) 0.5 MG tablet Take 1 tablet (0.5 mg total) by mouth 3 (three) times daily as needed for anxiety.   aspirin EC  81 MG tablet Take 81 mg by mouth daily. Swallow whole.   atorvastatin (LIPITOR) 80 MG tablet SMARTSIG:1 Tablet(s) By Mouth Every Evening   Cholecalciferol (VITAMIN D3 PO) Take 1 tablet by mouth daily.   clopidogrel (PLAVIX) 75 MG tablet Take 75 mg by mouth daily.   docusate sodium (COLACE) 100 MG capsule Take 100 mg by mouth as needed.   escitalopram (LEXAPRO) 20 MG tablet Take 20 mg by mouth daily.   gabapentin (NEURONTIN) 400 MG capsule Take 400 mg by mouth 4 (four) times daily.   insulin glargine (LANTUS) 100 unit/mL SOPN Inject 36 Units into the skin 2 (two) times daily.   JARDIANCE 25 MG TABS tablet Take 25 mg by mouth daily.   linaclotide (LINZESS) 72 MCG capsule Take 72 mcg by mouth as needed.   methocarbamol (ROBAXIN) 500 MG tablet Take 500 mg by mouth 2 (two) times daily.   metoprolol succinate (TOPROL-XL) 25 MG 24 hr tablet Take 0.5 tablets (12.5 mg total) by mouth daily. Take with or immediately following a meal.   ondansetron (ZOFRAN) 4 MG tablet Take 4 mg by mouth every 6 (six) hours as needed for nausea or vomiting.   OZEMPIC, 0.25 OR 0.5 MG/DOSE, 2 MG/3ML SOPN Inject 0.5 mg into the skin once a week.   pantoprazole (PROTONIX) 40 MG tablet Take 1 tablet (40 mg total) by mouth daily.   sacubitril-valsartan (ENTRESTO) 24-26 MG Take 1 tablet by mouth 2 (two) times daily. 30 day free voucher will be faxed   topiramate (TOPAMAX) 50 MG tablet Take 50 mg by mouth 2 (two) times daily.   vitamin B-12 1000 MCG tablet Take 1 tablet (1,000 mcg total) by mouth daily.    Allergies:   Patient has no known allergies.   Social History   Socioeconomic History   Marital status: Single    Spouse name: Not on file   Number of children: Not on file   Years of education: 12   Highest education level: 12th grade  Occupational History   Not on file  Tobacco Use   Smoking status: Every Day    Packs/day: 1.00    Years: 20.00    Additional pack years: 0.00    Total pack years: 20.00    Types:  Cigarettes    Passive exposure: Current   Smokeless tobacco: Never  Vaping Use   Vaping Use: Former  Substance and Sexual Activity   Alcohol use: No   Drug use: No   Sexual activity: Yes    Birth control/protection: Injection  Other Topics Concern   Not on file  Social History Narrative   Not on file   Social Determinants of Health   Financial Resource Strain: Low Risk  (11/03/2022)   Overall Financial Resource Strain (CARDIA)    Difficulty of Paying Living Expenses: Not hard at all  Food Insecurity: No Food Insecurity (06/15/2022)   Hunger Vital Sign    Worried About Running Out of Food in the Last Year: Never true    Ran Out of Food in the Last  Year: Never true  Transportation Needs: No Transportation Needs (11/03/2022)   PRAPARE - Administrator, Civil Service (Medical): No    Lack of Transportation (Non-Medical): No  Physical Activity: Inactive (10/28/2022)   Exercise Vital Sign    Days of Exercise per Week: 0 days    Minutes of Exercise per Session: 0 min  Stress: Stress Concern Present (03/18/2022)   [employer] of Occupational Health - Occupational Stress Questionnaire    Feeling of Stress : Rather much  Social Connections: Unknown (03/18/2022)   Social Connection and Isolation Panel [NHANES]    Frequency of Communication with Friends and Family: More than three times a week    Frequency of Social Gatherings with Friends and Family: More than three times a week    Attends Religious Services: More than 4 times per year    Active Member of [employer] or Organizations: No    Attends Banker Meetings: Never    Marital Status: Not on file     Family History: The patient's family history includes Breast cancer in her mother; Diabetes in her brother and sister; Kidney disease in her sister. There is no history of Colon cancer or Liver disease.  ROS:     Please see the history of present illness.    All other systems reviewed and are  negative.  EKGs/Labs/Other Studies Reviewed:    The following studies were reviewed today:   EKG:  EKG is not ordered today.    Monitor 10/2022: Predominant rhythm is sinus with heart rate ranging from 71 bpm up to 225 bpm and average heart rate 84 bpm. There were rare PACs representing less than 1% total beats. Frequent PVCs were noted representing 23.7% total beats with otherwise rare ventricular couplets and triplets.  Several episodes of ventricular bigeminy and trigeminy were also noted. There were no pauses or episodes of high degree heart block.  Myoview on 09/15/2022:    Baseline EKG showed normal sinus rhythm and frequent PVCs. Stress ECG is negative for ischemia but has frequent PVCs in a pattern of ventricular quadrigeminy and trigeminy.   LV perfusion is normal. There is no evidence of ischemia. There is no evidence of infarction.   Left ventricular function is normal. Nuclear stress EF: 55 %.   The study is normal. The study is low risk.  Carotid doppler on 09/08/2022:  Summary:  Right Carotid: Velocities in the right ICA are consistent with a 1-39%  stenosis. The ECA appears >50% stenosed.   Left Carotid: Velocities in the left ICA are consistent with a 60-79%  stenosis.  Hemodynamically significant plaque >50% visualized in the  CCA. The ECA appears >50% stenosed.   Vertebrals:  Right vertebral artery demonstrates antegrade flow. Left  vertebral artery demonstrates no discernable flow.  Subclavians: Normal flow hemodynamics were seen in bilateral subclavian arteries.  Echocardiogram on 07/16/2022:  1. Left ventricular ejection fraction, by estimation, is 50 to 55%. The  left ventricle has low normal function. The left ventricle has no regional  wall motion abnormalities. Left ventricular diastolic parameters are  consistent with Grade I diastolic  dysfunction (impaired relaxation).   2. Right ventricular systolic function is normal. The right ventricular  size is  normal. Tricuspid regurgitation signal is inadequate for assessing  PA pressure.   3. The mitral valve is abnormal. Mild mitral valve regurgitation. No  evidence of mitral stenosis.   4. The aortic valve has an indeterminant number of cusps. There is mild  calcification of the aortic valve. There is mild thickening of the aortic  valve. Aortic valve regurgitation is mild to moderate. No aortic stenosis  is present.   5. The inferior vena cava is normal in size with greater than 50%  respiratory variability, suggesting right atrial pressure of 3 mmHg.   Comparison(s): Echocardiogram done 02/03/22 showed an EF of 40%.  72 hour Zio monitor on 02/16/22: Predominant rhythm is sinus with heart rate ranging from 83 bpm up to 116 bpm and average heart rate 94 bpm. There were rare PACs representing less than 1% total beats. There were frequent PVCs representing approximately 14% total beats, also frequent ventricular couplets and triplets with limited episodes of ventricular bigeminy and trigeminy.  Multiple, brief episodes of NSVT were noted, the longest of which was 7 beats.  There were no sustained ventricular arrhythmias. No pauses.  2D Echo on 02/03/22:  1. Left ventricular ejection fraction, by estimation, is 40%. The left  ventricle has mildly decreased function. The left ventricle demonstrates  global hypokinesis. Left ventricular diastolic parameters are  indeterminate. The average left ventricular  global longitudinal strain is -12.4 %. The global longitudinal strain is  abnormal.   2. Right ventricular systolic function is normal. The right ventricular  size is normal. Tricuspid regurgitation signal is inadequate for assessing  PA pressure.   3. A small pericardial effusion is present. The pericardial effusion is  circumferential. There is no evidence of cardiac tamponade.   4. The mitral valve is abnormal. Mild to moderate mitral valve  regurgitation. No evidence of mitral stenosis.    5. The aortic valve is tricuspid. There is mild calcification of the  aortic valve. There is mild thickening of the aortic valve. Aortic valve  regurgitation is mild.   6. The inferior vena cava is normal in size with greater than 50%  respiratory variability, suggesting right atrial pressure of 3 mmHg.   Comparison(s): Echocardiogram done 01/28/12 showed an EF of 65-70%.  Recent Labs: No results found for requested labs within last 365 days.  Recent Lipid Panel No results found for: "CHOL", "TRIG", "HDL", "CHOLHDL", "VLDL", "LDLCALC", "LDLDIRECT"  Physical Exam:    VS:  BP 124/70   Pulse 81   Wt 191 lb (86.6 kg)   SpO2 97%   BMI 30.83 kg/m     Wt Readings from Last 3 Encounters:  12/01/22 191 lb (86.6 kg)  10/22/22 191 lb (86.6 kg)  10/15/21 191 lb (86.6 kg)     GEN: Well nourished, well developed in no acute distress, appears fatigued HEENT: Normal NECK: No JVD; Carotid bruit along left neck, no carotid bruit along right neck.  CARDIAC: S1/S2, regular rate with abnormal rhythm due to early beats noted, no murmurs, rubs, gallops; 2+ peripheral pulses throughout, strong and equal bilaterally RESPIRATORY:  Clear and diminished to auscultation without rales, wheezing or rhonchi  MUSCULOSKELETAL:  No edema; Bilateral AKA, wheelchair bound, otherwise normal SKIN: Pale appearance, warm and dry NEUROLOGIC:  Alert and oriented x 3 PSYCHIATRIC:  Normal affect   ASSESSMENT:    1. Heart failure with improved ejection fraction (HFimpEF) (HCC)   2. Palpitations   3. Frequent PVCs   4. Essential hypertension, benign   5. PAD (peripheral artery disease) (HCC)   6. Bruit of left carotid artery   7. Bilateral carotid artery stenosis   8. Hyperlipidemia, unspecified hyperlipidemia type   9. Tobacco abuse   10. Chronic fatigue   11. Left arm pain   12.  Neck pain      PLAN:    In order of problems listed above:  HFmrEF - > HFimpEF TTE 01/2022 revealed EF decreased at 40%,  global hypokinesis of left ventricle noted. EF improved to 50-55% 06/2022.  Etiology unclear, although could be due to ischemic cardiomyopathy. Euvolemic and well compensated on exam. Continue current GDMT. Low sodium diet, fluid restriction <2L, and daily weights encouraged. Educated to contact our office for weight gain of 2 lbs overnight or 5 lbs in one week. Heart healthy diet encouraged.    Palpitations, frequent PVC's NST revealed frequent PVC's. Continues to admit to palpitations. Monitor showed frequent PVC's. I have encouraged her to cut back on this and to eliminate caffeine.  Continue current medication regimen. Heart healthy diet encouraged. Has upcoming appt with Dr. Nelly Laurence.   HTN Blood pressure today stable. Discussed to monitor BP at home at least 2 hours after medications and sitting for 5-10 minutes. Previously given BP log and recommended to obtain an OMRON cuff. Heart healthy diet recommended. Continue current medication regimen.  PAD, s/p Bilateral AKA, left carotid bruit, carotid artery stenosis Following [facility].  Continue aspirin, Lipitor, and Plavix.  Heart healthy diet recommended.  Continue to follow-up with [facility].  Carotid doppler showed mild stenosis in right carotid artery, 1 to 39%, moderate stenosis in left carotid artery, 60 to 79%. Continue current medication regimen - suggest follow-up study 08/2023.   HLD LDL 06/2022 was 47.  Continue Lipitor.  Heart healthy diet recommended.  6. Tobacco abuse Patient continues to smoke; however she is not interested in quitting smoking at this time. Smoking cessation encouraged and discussed.  7. Chronic fatigue Continues to admit to poor appetite. Recommended Glucerna supplements and smoking cessation. Has upcoming labs arranged w/ PCP, recommended she fax over results to our office.   8. Left arm pain/neck pain Etiology MSK related. Was given injection by orthopedist at last OV that did help. Recommended Tylenol 1,000 mg BID PRN.  Continue to follow-up with orthopedics.  8. Disposition: Follow-up with Dr. Diona Browner or aPP in 6 months or sooner if anything changes.   Medication Adjustments/Labs and Tests Ordered: Current medicines are reviewed at length with the patient today.  Concerns regarding medicines are outlined above.  No orders of the defined types were placed in this encounter.  No orders of the defined types were placed in this encounter.   Signed, Sharlene Dory, NP 12/01/2022, 10:25 AM [facility]

## 2022-12-04 ENCOUNTER — Ambulatory Visit: Payer: Self-pay | Admitting: *Deleted

## 2022-12-04 NOTE — Patient Outreach (Signed)
  Care Coordination   12/04/2022 Name: [name] MRN: [id number] DOB: [birth year]   Care Coordination Outreach Attempts:  An unsuccessful telephone outreach was attempted for a scheduled appointment today.  Follow Up Plan:  No further outreach attempts will be made at this time. We have been unable to contact the patient to offer or enroll patient in care coordination services  Encounter Outcome:  No Answer. HIPAA compliant VM left. Closing Care Coordination case due to inability to maintain contact with patient. Provided RN Care Coordinator telephone number and advised that patient can reach out if care coordination services or resources are needed.   Care Coordination Interventions:  No, not indicated    Demetrios Loll, BSN, RN-BC RN Care Coordinator [facility]  [facility] Direct Dial: [phone] Main #: [phone]

## 2022-12-14 ENCOUNTER — Telehealth: Payer: Self-pay | Admitting: *Deleted

## 2022-12-14 NOTE — Patient Outreach (Signed)
  Care Coordination   Complex Case Discussion Note   12/14/2022 Name: [name] MRN: [id number] DOB: 1971-04-08  [name] is a 52 y.o. year old female who sees [name], [name], [place] for primary care. I  collaborated with Care Management Team Members.     Goals Addressed             This Visit's Progress    Care Coordination Services       Care Coordination Goals: Patient will schedule a telephone follow-up with RN Care Coordinator and will maintain contact with Care Management team Patient will reach out to LCSW or RN Care Coordinator [phone] with any resource or care coordination needs Patient will keep all medical appointments Patient will talk with PCP about Home Health referral for sacral wound if this has not already been put into place Patient will continue to use sister's assistance to apply barrier cream to sacral wound and will rotate positions at least every 2 hours Patient will obtain a gel cushion for her wheelchair and gel overlay for bed Patient will talk with RN Care Coordinator re: continuous glucose monitor like the Freestyle Libre or [name] to assist with better blood sugar control Patient will talk with RN Care Coordinator about medications and compliance Patient will talk with RN Care Coordinator about frequency of blood sugar testing Patient will talk with RN Care Coordinator about diet and nutrition for diabetics      SDOH assessments and interventions completed:  No  Care Coordination Interventions:  Yes, provided  Interventions Today    Flowsheet Row Most Recent Value  Chronic Disease   Chronic disease during today's visit Diabetes, Other  [Bed/wheelchair bound due to bilateral AKAs]  General Interventions   General Interventions Discussed/Reviewed General Interventions Reviewed, Doctor Visits, Communication with, Durable Medical Equipment (DME)  Doctor Visits Discussed/Reviewed Doctor Visits Reviewed, PCP, Specialist  Durable  Medical Equipment (DME) Wheelchair, Glucomoter  [Will recommend [name] or [name] CGM for better blood sugar monitoring and compliance]  Wheelchair Motorized, Architect with Charity fundraiser, Social Work  [employer], Livia Snellen, RN, [employer], LCSW,& Care Guide, Gwenevere Ghazi re: complex care management. Patient is not on Current APL list. Will wait for instructions from [place] before [name] proceeds with rescheduling [name] app]  Exercise Interventions   Exercise Discussed/Reviewed Physical Activity  Physical Activity Discussed/Reviewed Physical Activity Reviewed  [limited by bil AKA. Can do chair and upper extremity exercises. RNCC to provide information]  Mental Health Interventions   Mental Health Discussed/Reviewed Mental Health Reviewed, Anxiety, Depression  [LCSW has worked with patient re: resources. may need counseling services if not already getting those elsewhere.]  Safety Interventions   Safety Discussed/Reviewed Home Safety, Safety Reviewed  Home Safety Assistive Devices, Contact provider for referral to PT/OT  [PCP was to refer to [name] for sacral wound care at 10/30/22 F2F visit. RNCC to f/u on this. Has PCS services. Uses motorized wheelchair now that she has been approved. Sister cleans wound applies barrier cream. Recommended gel overlay for bed and wheelchair]       Follow up plan:  Telephone appointment scheduling pending approval due to patient not being on APL    Encounter Outcome:  Pt. Visit Completed   [name], BSN, RN-BC RN Care Coordinator [name]  [facility] Direct Dial: [phone] Main #: [phone]

## 2022-12-15 ENCOUNTER — Telehealth: Payer: Self-pay | Admitting: *Deleted

## 2022-12-15 NOTE — Progress Notes (Signed)
  Care Coordination Note  12/15/2022 Name: [name] MRN: [id number] DOB: [birth year]  [name] is a 52 y.o. year old female who is a primary care patient of [name] and is actively engaged with the care management team. I reached out to [name] by phone today to assist with re-scheduling a follow up visit with the RN Case Manager  Follow up plan: Unsuccessful telephone outreach attempt made. A HIPAA compliant phone message was left for the patient providing contact information and requesting a return call.   SIGNATURE

## 2022-12-23 NOTE — Progress Notes (Signed)
  Care Coordination Note  12/23/2022 Name: [name] MRN: [id number] DOB: 1971/05/11  [name] is a 52 y.o. year old female who is a primary care patient of [name] and is actively engaged with the care management team. I reached out to [name] by phone today to assist with re-scheduling a follow up visit with the RN Case Manager  Follow up plan: Unsuccessful telephone outreach attempt made. A HIPAA compliant phone message was left for the patient providing contact information and requesting a return call.   [facility]  Care Coordination Care Guide  Direct Dial: [phone]

## 2022-12-23 NOTE — Progress Notes (Signed)
  Care Coordination Note  12/23/2022 Name: [name] MRN: [id number] DOB: Jul 06, 1971  [name] is a 52 y.o. year old female who is a primary care patient of [name] and is actively engaged with the care management team. I reached out to [name] by phone today to assist with re-scheduling a follow up visit with the RN Case Manager  Follow up plan: Telephone appointment with care management team member scheduled for:12/28/22  [facility] Coordination Care Guide  Direct Dial: [phone]

## 2022-12-28 ENCOUNTER — Encounter: Payer: Self-pay | Admitting: *Deleted

## 2022-12-28 ENCOUNTER — Ambulatory Visit: Payer: Self-pay | Admitting: *Deleted

## 2022-12-29 NOTE — Patient Outreach (Signed)
Care Coordination   Follow Up Visit Note   12/28/2022 Name: [name] MRN: [id number] DOB: [birth year]  [name] is a 52 y.o. year old female who sees [place], [name], [place] for primary care. I spoke with  [name] by phone today.  What matters to the patients health and wellness today?  Managing blood sugar, using continuous glucose monitor, managing sacral wound    Goals Addressed             This Visit's Progress    COMPLETED: Care Coordination Services   On track    Care Coordination Goals: Patient will schedule a telephone follow-up with RN Care Coordinator and will maintain contact with Care Management team Patient will reach out to LCSW or RN Care Coordinator [phone] with any resource or care coordination needs Patient will keep all medical appointments Patient will talk with PCP about Home Health referral for sacral wound if this has not already been put into place Patient will continue to use sister's assistance to apply barrier cream to sacral wound and will rotate positions at least every 2 hours Patient will obtain a gel cushion for her wheelchair and gel overlay for bed Patient will talk with RN Care Coordinator re: continuous glucose monitor like the Freestyle Libre or Dexcom to assist with better blood sugar control Patient will talk with RN Care Coordinator about medications and compliance Patient will talk with RN Care Coordinator about frequency of blood sugar testing Patient will talk with RN Care Coordinator about diet and nutrition for diabetics      Manage Blood Sugar   On track    Care Coordination Goals: Patient will follow-up with PCP every 3 months or as recommended Patient will take medication as prescribed and reach out to provider with any negative side effects Patient will check and record blood sugar at least two times per day Patient will take blood sugar log and meter to provider visits for review Patient will use  continuous glucose monitor (CGM) once it arrives. It has been ordered and shipped.  Patient will follow-up with PCP with any questions regarding use of CGM Patient will follow a modified carbohydrate diet and decrease simple carbohydrates and sugars Patient will talk with PCP about order for nutritional supplement, like Glucerna or sugar free Boost, at visit on 12/31/22 This was ordered but the order was filled out incorrectly Patient will increase activity level as tolerated with an ultimate goal of at least 150 minutes of exercise per week Review handouts on chair exercises (has bil AKA) Patient will have yearly eye exams to check for or monitor diabetic retinopathy Patient will reach out to RN Care Coordinator [phone] with any care coordination or resource needs      Resolution of Pressure Wound   On track    Care Coordination Goals: Patient will change positions at least every 2 hours and try to stay off of her coccyx as much as possible Patient will continue to use a gel cushion with a coccyx cutout in her wheelchair Patient will use pillows to position in bed to relieve pressure on coccyx Patient will continue to use gel overlay mattress pad on bed Patient/sister will continue to apply a barrier cream to the wound and will keep it clean and dry Patient will continue to work with home health nurse regarding wound care Patient will have PCP look at sacral wound at visit on 12/31/22 Patient will reach out to provider with any new or  worsening symptoms Patient will reach out to RN Care Coordinator [phone] with any care coordination or resource needs          SDOH assessments and interventions completed:  Yes  SDOH Interventions Today    Flowsheet Row Most Recent Value  SDOH Interventions   Food Insecurity Interventions Intervention Not Indicated  Transportation Interventions Intervention Not Indicated        Care Coordination Interventions:  Yes, provided   Interventions Today    Flowsheet Row Most Recent Value  Chronic Disease   Chronic disease during today's visit Diabetes  General Interventions   General Interventions Discussed/Reviewed --  [Blood sugar is still frequently over 200]  Labs Hgb A1c every 3 months, Kidney Function  Doctor Visits Discussed/Reviewed Doctor Visits Discussed, PCP, Doctor Visits Reviewed, Specialist  [upcoming appt with PCP on 12/31/22. Will f/u with PCP re: order for nutritional supplement and will have PCP assess sacral wound progress]  Durable Medical Equipment (DME) Glucomoter, Walker, Wheelchair  [CGM has been ordered and shipped. Patient expects it to arrive soon.]  Wheelchair Motorized, Standard  PCP/Specialist Visits Compliance with follow-up visit  Exercise Interventions   Exercise Discussed/Reviewed Exercise Discussed, Exercise Reviewed, Physical Activity  Physical Activity Discussed/Reviewed Physical Activity Discussed, Physical Activity Reviewed, Types of exercise  Education Interventions   Education Provided Provided Printed Education, Provided Education  Provided Verbal Education On Exercise, Medication, Nutrition, When to see the doctor, Mental Health/Coping with Illness, Blood Sugar Monitoring, Other, Labs  [sacral wound care, printed materials on chair exercises, verbal education on use of CGM]  Labs Reviewed Hgb A1c  Nutrition Interventions   Nutrition Discussed/Reviewed Nutrition Discussed, Nutrition Reviewed, Supplemental nutrition, Carbohydrate meal planning  [Patient will talk with PCP at visit on 12/31/22 re: reordering supplement like sugar free boost or Glucerna]  Pharmacy Interventions   Pharmacy Dicussed/Reviewed Pharmacy Topics Discussed, Pharmacy Topics Reviewed, Medications and their functions  Safety Interventions   Safety Discussed/Reviewed Safety Discussed, Safety Reviewed  Home Safety Assistive Devices  [motorized wheelchair. Has gel overlay for mattress and pad for wheelchair.]        Follow up plan: Follow up call scheduled for 01/27/23    Encounter Outcome:  Pt. Visit Completed   Demetrios Loll, BSN, RN-BC RN Care Coordinator [facility]  [facility] Direct Dial: [phone] Main #: [phone]

## 2023-01-08 ENCOUNTER — Institutional Professional Consult (permissible substitution): Payer: 59 | Admitting: Cardiovascular Disease

## 2023-01-27 ENCOUNTER — Encounter: Payer: Self-pay | Admitting: *Deleted

## 2023-01-27 ENCOUNTER — Ambulatory Visit: Payer: Self-pay | Admitting: *Deleted

## 2023-01-27 NOTE — Patient Outreach (Signed)
Care Coordination   Follow Up Visit Note   01/27/2023 Name: [name] MRN: [id number] DOB: 08/11/1970  [name] is a 52 y.o. year old female who sees [place], [name], [place] for primary care. I spoke with  [name] by phone today.  What matters to the patients health and wellness today?  Managing blood sugar, sacral wounds, nausea/vomiting/diarrhea    Goals Addressed             This Visit's Progress    Manage Blood Sugar   Not on track    Care Coordination Goals: Patient will follow-up with PCP every 3 months or as recommended Patient will take medication as prescribed and reach out to provider with any negative side effects Patient will check and record blood sugar at least two times per day Patient will take blood sugar log and meter to provider visits for review Patient will use continuous glucose monitor (CGM) once it arrives. It has been ordered and shipped.  Patient will follow-up with PharmD at PCP office on 7/14 as planned to initiate use of CGM Patient will follow a modified carbohydrate diet and decrease simple carbohydrates and sugars Patient will continue to use Glucerna or sugar free Boost as a nutritional supplement Patient will increase activity level as tolerated with an ultimate goal of at least 150 minutes of exercise per week reference handouts on chair exercises (has bil AKA) Patient will reach out to RN Care Coordinator [phone] with any care coordination or resource needs      Resolution of Pressure Wound   Not on track    Care Coordination Goals: Patient will change positions at least every 2 hours and try to stay off of her coccyx as much as possible Patient will continue to use a gel cushion with a coccyx cutout in her wheelchair Patient will use pillows to position in bed to relieve pressure on coccyx Patient will continue to use gel overlay mattress pad on bed Patient/sister will continue to apply a barrier cream to the  wound and will keep it clean and dry Patient will continue to work with home health nurse regarding wound care Patient will talk with PCP about new pressure wounds on buttocks Has been having diarrhea and this has complicated wound healing Patient will reach out to provider with any new or worsening symptoms Patient will reach out to RN Care Coordinator [phone] with any care coordination or resource needs       Resolve Nausea, Vomiting, and Diarrhea       Care Coordination Goals: Patient will notify PCP of N/V/D and will schedule an office visit or seek evaluation at urgent care  Vomiting and/or diarrhea for almost 2 weeks. Felt better for a few days and then it returned. She does feel better today and has been able to drink chicken broth and Glucerna and eat crackers Patient will avoid dehydration Drink sugar free electrolyte replacement like Gatorade Patient will advance diet as tolerated Chicken broth and increase to bland whole foods as tolerated Avoid dairy, greasy, spicy foods Patient will continue to monitor blood sugar and will notify PCP of readings outside of recommended range Patient will call RN Care Coordinator with any resource or care coordination needs [phone]         SDOH assessments and interventions completed:  Yes  SDOH Interventions Today    Flowsheet Row Most Recent Value  SDOH Interventions   Transportation Interventions Intervention Not Indicated  Physical Activity Interventions Patient  Declined        Care Coordination Interventions:  Yes, provided  Interventions Today    Flowsheet Row Most Recent Value  Chronic Disease   Chronic disease during today's visit Diabetes  General Interventions   General Interventions Discussed/Reviewed General Interventions Discussed, General Interventions Reviewed, Labs, Durable Medical Equipment (DME), Doctor Visits  [blood sugar has been running in the 200-300 since having vomiting and diarrhea]  Labs  Hgb A1c every 3 months  Doctor Visits Discussed/Reviewed Doctor Visits Discussed, Doctor Visits Reviewed, PCP, Specialist  [place]ie [place] f/u with PharmD on 7/14 to go over [facility]. Keep appt with PCP the beginning of August. See PCP or Urgent Care Re: vomiting and diarrhea]  Durable Medical Equipment (DME) Glucomoter, Ecologist  PCP/Specialist Visits Compliance with follow-up visit  Education Interventions   Education Provided Provided Education  Provided Verbal Education On Nutrition, Blood Sugar Monitoring, Mental Health/Coping with Illness, Sick Day Rules, When to see the doctor, Medication, Other  [Stay hydrated. Drink sugar free gatorade or other electrolyte replacement. Avoid dairy, greasy, or spicy foods. Adance diet as tolerated. Continue chicken broth, crackers, etc. Discussed s/s of DKA. See PCP or Urgent Care. Discussed wound care.]  Nutrition Interventions   Nutrition Discussed/Reviewed Nutrition Discussed, Nutrition Reviewed, Fluid intake, Supplemental nutrition  Pharmacy Interventions   Pharmacy Dicussed/Reviewed Pharmacy Topics Discussed, Pharmacy Topics Reviewed, Medications and their functions  [Has reached out to PCP for refill on Zofran ODT. Needs to be assessed if symptoms persist or worsen. Disussed potential side effects of ozempic. She held it for one week but n/v/d did not improve.]  Safety Interventions   Safety Discussed/Reviewed Safety Discussed, Safety Reviewed       Follow up plan: Follow up call scheduled for 02/02/23    Encounter Outcome:  Pt. Visit Completed   Demetrios Loll, BSN, RN-BC RN Care Coordinator [facility]  [facility] Direct Dial: [phone] Main #: [phone]

## 2023-02-02 ENCOUNTER — Ambulatory Visit: Payer: Self-pay | Admitting: *Deleted

## 2023-02-02 NOTE — Patient Outreach (Signed)
  Care Coordination   02/02/2023 Name: [name] MRN: [id number] DOB: May 28, 1971   Care Coordination Outreach Attempts:  An unsuccessful telephone outreach was attempted for a scheduled appointment today.  Follow Up Plan:  Additional outreach attempts will be made to offer the patient care coordination information and services.   Encounter Outcome:  No Answer. Left HIPAA compliant VM.   Care Coordination Interventions:  No, not indicated    Demetrios Loll, BSN, RN-BC RN Care Coordinator [facility]  [facility] Direct Dial: [phone] Main #: [phone]

## 2023-02-25 ENCOUNTER — Ambulatory Visit: Payer: Self-pay | Admitting: *Deleted

## 2023-02-25 NOTE — Progress Notes (Signed)
  Electrophysiology Office Note:    Date:  02/26/2023   ID:  [name], DOB 04/07/1971, MRN [id number]  PCP:  [name]   [facility] Providers Cardiologist:  Nona Dell, MD Electrophysiologist:  Maurice Small, MD     Referring MD: Sharlene Dory, NP   History of Present Illness:    [name] is a 52 y.o. female with a medical history significant for heart failure with slightly decreased ejection fraction, frequent PVCs, hypertension, PAD sepsis bilateral above-the-knee amputations, CKD 3, referred for management of PVCs.     She first came to the attention of cardiology in July 2023 when she saw Dr. Diona Browner for frequent palpitations and PVCs.  Her ejection fraction at that time was reduced at 40%, and a Zio patch showed a PVC burden of 5.4%.  After starting GDMT, her EF normalized.  In follow-up, the patient continued to notice palpitations, fatigue.  Repeat monitor showed a PVC burden of 24%      Today, she reports that she is at baseline.  She is consistently fatigued.  She has episodes of palpitations frequently.  She is not sure if these correlate with increase in fatigue.  She drinks about 1 cup of coffee a day.  She continues to smoke cigarettes.   EKGs/Labs/Other Studies Reviewed Today:    Echocardiogram:  07/16/2022 EF 50 to 55%, mild mitral regurgitation   Monitors:  Zio 10d 08/2022 Sinus rhythm heart rate 70-125 beats minute, average 84 23.7% PVC burden, no symptoms reported no other arrhythmia detected   Stress testing:  09/15/2022 No evidence of ischemia. Frequent PVCs noted  Advanced imaging:   Cardiac catherization   EKG:      Reviewed today.  PVCs are + II, - III, isoelectric aVF. - V1, + V2, -V3; +I   Physical Exam:    VS:  BP 130/62   Pulse 60   SpO2 98%     Wt Readings from Last 3 Encounters:  12/01/22 191 lb (86.6 kg)  10/22/22 191 lb (86.6 kg)  10/15/21 191 lb (86.6 kg)     GEN:  Well nourished, well developed in no acute distress CARDIAC: RRR with occasional abnormal beat, no murmurs, rubs, gallops RESPIRATORY:  Normal work of breathing MUSCULOSKELETAL: Status post bilateral AKA    ASSESSMENT & PLAN:    Frequent PVCs Symptomatic with mild palpitations She also has fatigue, but it is uncertain whether these correlate with the PVCs I do not think she will be a candidate for ablation due to prior AKI Will try a broad acting beta-blocker, propranolol LA 60. Stop metoprolol  CHF with recovered ejection fraction EF has improved despite PVCs --I do not think that she had a PVC induced cardiomyopathy Of the propranolol is not a CHF beta-blocker, I think will be overall benefit if it is more effective in suppressing her PVCs  Tobacco abuse Smoking cessation encouraged  Vascular disease --peripheral, carotid, coronary On aspirin 81, Lipitor 80, clopidogrel 75   Signed, Maurice Small, MD  02/26/2023 11:39 AM    [facility]

## 2023-02-25 NOTE — Patient Outreach (Signed)
  Care Coordination   02/25/2023 Name: [name] MRN: [id number] DOB: 12-02-1970   Care Coordination Outreach Attempts:  An unsuccessful telephone outreach was attempted for a scheduled appointment today.  Follow Up Plan:  Additional outreach attempts will be made to offer the patient care coordination information and services.   Encounter Outcome:  No Answer   Care Coordination Interventions:  No, not indicated    Demetrios Loll, BSN, RN-BC RN Care Coordinator [facility]  [facility] Direct Dial: [phone] Main #: [phone]

## 2023-02-26 ENCOUNTER — Ambulatory Visit: Payer: 59 | Attending: Cardiovascular Disease | Admitting: Cardiovascular Disease

## 2023-02-26 ENCOUNTER — Encounter: Payer: Self-pay | Admitting: Cardiovascular Disease

## 2023-02-26 VITALS — BP 130/62 | HR 60

## 2023-02-26 DIAGNOSIS — R002 Palpitations: Secondary | ICD-10-CM

## 2023-02-26 MED ORDER — PROPRANOLOL HCL ER 60 MG PO CP24
60.0000 mg | ORAL_CAPSULE | Freq: Every day | ORAL | 6 refills | Status: DC
Start: 2023-02-26 — End: 2023-04-30

## 2023-02-26 NOTE — Patient Instructions (Addendum)
.  Medication Instructions:   Stop Toprol XL (Metoprolol Suc) Begin Propranolol LA 60mg  daily  Continue all other medications.     Labwork:  none  Testing/Procedures:  none  Follow-Up:  2 months   Any Other Special Instructions Will Be Listed Below (If Applicable).   If you need a refill on your cardiac medications before your next appointment, please call your pharmacy.

## 2023-03-04 ENCOUNTER — Telehealth: Payer: Self-pay | Admitting: *Deleted

## 2023-03-04 NOTE — Progress Notes (Signed)
  Care Coordination Note  03/04/2023 Name: ATHLEEN HUGUNIN MRN: [id number] DOB: 09/01/1970  ADELA NILSSON is a 52 y.o. year old female who is a primary care patient of [name] and is actively engaged with the care management team. I reached out to [name] by phone today to assist with re-scheduling a follow up visit with the RN Case Manager  Follow up plan: Unsuccessful telephone outreach attempt made. A HIPAA compliant phone message was left for the patient providing contact information and requesting a return call.   [facility]  Care Coordination Care Guide  Direct Dial: [phone]

## 2023-03-05 NOTE — Progress Notes (Signed)
  Care Coordination Note  03/05/2023 Name: [name] MRN: [id number] DOB: 11-24-1970  [name] is a 52 y.o. year old female who is a primary care patient of [name] and is actively engaged with the care management team. I reached out to [name] by phone today to assist with re-scheduling a follow up visit with the RN Case Manager  Follow up plan: Telephone appointment with care management team member scheduled for:03/18/23  [facility] Coordination Care Guide  Direct Dial: [phone]

## 2023-03-17 ENCOUNTER — Other Ambulatory Visit: Payer: Self-pay | Admitting: Cardiology

## 2023-03-17 DIAGNOSIS — K921 Melena: Secondary | ICD-10-CM

## 2023-03-17 DIAGNOSIS — R197 Diarrhea, unspecified: Secondary | ICD-10-CM

## 2023-03-17 DIAGNOSIS — R1011 Right upper quadrant pain: Secondary | ICD-10-CM

## 2023-03-18 ENCOUNTER — Encounter: Payer: Self-pay | Admitting: *Deleted

## 2023-03-18 ENCOUNTER — Ambulatory Visit: Payer: Self-pay | Admitting: *Deleted

## 2023-03-18 NOTE — Patient Outreach (Addendum)
Care Coordination   Follow Up Visit Note   03/18/2023 Name: [name] MRN: [id number] DOB: 08/24/1970  [name] is a 52 y.o. year old female who sees [place], [name], [place] for primary care. I spoke with  [name] by phone today.  What matters to the patients health and wellness today?  Managing blood sugar    Goals Addressed             This Visit's Progress    Manage Blood Sugar   On track    Care Coordination Goals: Patient will follow-up with [name], PharmD with [facility] next month as scheduled Patient will increase Lantus to 30 units twice a day as instructed at appointment yesterday Patient will monitor blood sugar at least 4 times a day with freestyle libre continuous glucose monitor Patient will use alarms on glucose monitor to alert her of high or low blood sugar Patient will eat 3 meals per day with 30 GM of CHO each and up to 2 snacks per day with less than 15 GM of CHO Patient stop drinking [facility] or at least cut back until she can stop.  Patient will supplement with Glucerna or sugar free boost if appetite is too poor to eat Patient will state understanding of the importance of eating meals at regular intervals since she is on a long acting insulin Patient will consider talking with a registered dietician Patient will reach out to RN Care Coordinator [phone] with any care coordination or resource needs      Prevent Pressure Wounds   On track    Care Coordination Goals: Patient will change positions at least every two hours Use pillows or rolls to assist with positioning Sister will assist with application of barrier creams on pressure points  Continue to use gel overlay mattress and gel cushion in wheelchair with coccyx cutout Eat a healthy diet with protein Clean and urine or fecal matter off as soon as possible Use mild soap and rinse well with water Sister will assess skin daily Reach out to provider  if any discolored areas are noted or if there are any changes in the skin     COMPLETED: Resolution of Pressure Wound       Care Coordination Goals: Patient will change positions at least every 2 hours and try to stay off of her coccyx as much as possible Patient will continue to use a gel cushion with a coccyx cutout in her wheelchair Patient will use pillows to position in bed to relieve pressure on coccyx Patient will continue to use gel overlay mattress pad on bed Patient/sister will continue to apply a barrier cream to the wound and will keep it clean and dry Patient will continue to work with home health nurse regarding wound care Patient will talk with PCP about new pressure wounds on buttocks Has been having diarrhea and this has complicated wound healing Patient will reach out to provider with any new or worsening symptoms Patient will reach out to RN Care Coordinator [phone] with any care coordination or resource needs  Resolved. Pressure wound on sacrum has healed.      Resolve Nausea, Vomiting, and Diarrhea   On track    Care Coordination Goals: Keep appointment with gastroenterologist for 03/19/23 to discuss symptoms Nausea persists. Vomiting and diarrhea have improved. Reach out to provider with any new or worsening symptoms Avoid spicy, greasy, fried foods, and diary Patient will call RN Care Coordinator  with any resource or care coordination needs [phone]         SDOH assessments and interventions completed:  Yes  SDOH Interventions Today    Flowsheet Row Most Recent Value  SDOH Interventions   Transportation Interventions Intervention Not Indicated  Physical Activity Interventions Patient Declined  Health Literacy Interventions Intervention Not Indicated        Care Coordination Interventions:  Yes, provided  Interventions Today    Flowsheet Row Most Recent Value  Chronic Disease   Chronic disease during today's visit Diabetes, Other   [bilateral AKA]  General Interventions   General Interventions Discussed/Reviewed General Interventions Discussed, General Interventions Reviewed, Labs, Durable Medical Equipment (DME), Doctor Visits, Communication with  [facility] Mom is not on the All Payor's List. Will need to transition Care Management services. Reece with [facility] said that it hasn't been decided if [facility] will provide these services. Will reach out to [facility] Management to refer patient]  Labs Hgb A1c every 3 months, Kidney Function  Doctor Visits Discussed/Reviewed Doctor Visits Discussed, Doctor Visits Reviewed, PCP  Durable Medical Equipment (DME) Wheelchair, Glucomoter, Other  [gel overlay mattress, Gel seat cushion with coccyx cutout.Blood sugar was 229 this morning. Lowest has been 59 on one occasion. Lowest averages around 120. Has been over 300. Mostly averages over 200.]  Wheelchair Standard  PCP/Specialist Visits Compliance with follow-up visit  [Gastroenterologist on 03/19/23, [name], PharmD with [facility] next month]  Communication with PCP/Specialists  [Spoke with [name] [phone] at [facility]. Requested referral to [name] is preferable to patient) & Registered Dietician.It hasn't been decided if [name], PharmD will remain at the practice since [facility] is closing.]  Exercise Interventions   Exercise Discussed/Reviewed Physical Activity, Assistive device use and maintanence  [bed and wheelchair bound due to AKA. has been provided printed education chair exercises in the past. Encouraged to reference those for use and to increase activity level as tolerated]  Physical Activity Discussed/Reviewed Physical Activity Discussed, Physical Activity Reviewed  Education Interventions   Education Provided Provided Printed Education, Provided Education  [Printed education on nutrition, dangers of skipping meals, side lying and semi-side lying positions with support]  Provided Verbal Education  On Nutrition, When to see the doctor, Blood Sugar Monitoring, Labs, Medication  [consider talking with registered dietician]  Labs Reviewed Hgb A1c  [07/25/22 A1C 10.2%. Needs A1C at visit in September at PCP office.]  Mental Health Interventions   Mental Health Discussed/Reviewed Mental Health Discussed, Mental Health Reviewed  Nutrition Interventions   Nutrition Discussed/Reviewed Nutrition Discussed, Nutrition Reviewed, Carbohydrate meal planning, Increasing proteins, Fluid intake, Supplemental nutrition, Decreasing sugar intake  [Decr. until [employer]. Drinking 5 cans per day. Eating 1 meal per day. Explained the dangers of this. Drink glucerna or sugar free boost if unable to eat. 3 meals per day with 30GM of CHO ^ up to 2 snacks per day with less than 15GM CHO]  Pharmacy Interventions   Pharmacy Dicussed/Reviewed Pharmacy Topics Discussed, Pharmacy Topics Reviewed, Medications and their functions  [Taking medications regularly. Explained dangers of taking long acting insulin without eating regularly. Blood sugar remains very high despite not eating but once a day on most days]  Safety Interventions   Safety Discussed/Reviewed Safety Discussed, Safety Reviewed, Home Safety  [discussed frequent position changes to decrease risk for pressure wounds. Provided printed education on side lying and semi-side lying supportive positions]  Home Safety Assistive Devices       Follow up plan: Follow up call scheduled for  03/24/23    Encounter Outcome:  Pt. Visit Completed   Demetrios Loll, BSN, RN-BC RN Care Coordinator [facility]  [facility] Direct Dial: [phone] Main #: [phone]

## 2023-03-19 ENCOUNTER — Ambulatory Visit: Payer: 59 | Admitting: Gastroenterology

## 2023-03-24 ENCOUNTER — Encounter: Payer: Self-pay | Admitting: *Deleted

## 2023-03-24 ENCOUNTER — Ambulatory Visit: Payer: Self-pay | Admitting: *Deleted

## 2023-03-24 NOTE — Patient Outreach (Signed)
  Care Coordination   03/24/2023 Name: [name] MRN: [id number] DOB: 10/27/1970   Care Coordination Outreach Attempts:  An unsuccessful telephone outreach was attempted for a scheduled appointment today.  Follow Up Plan:  Additional outreach attempts will be made to offer the patient care coordination information and services.   Encounter Outcome:  No Answer. Left HIPAA compliant VM requesting return call.   Care Coordination Interventions:  Yes, provided   Interventions Today    Flowsheet Row Most Recent Value  General Interventions   General Interventions Discussed/Reviewed Communication with  Communication with PCP/Specialists, RN, Social Work  Gwen Her with Jeanella Anton at [facility] Re: if they have a care management program available. They don't at this time. Talked with [facility] coworkers Re: Care Management through [facility].]     Patient is not on the [facility] All Payors List and hasn't been for many months. Reece at [facility] to reach out to patient Re: any current needs. I reviewed payor benefits and [employer] for care management. Patient is dual eligible and would likely qualify for care coordination or care management services. Edd Arbour, RN provided the following information that I will pass on to the patient: [facility] [phone] and [email or web address]   Demetrios Loll, RN, BSN RN Care Coordinator [facility]  [facility] Direct Dial: [phone] Main #: [phone]

## 2023-03-29 NOTE — Progress Notes (Unsigned)
GI Office Note    Referring Provider: Royann Shivers, * Primary Care Physician:  Royann Shivers, PA-C  Primary Gastroenterologist: formerly Dr. Darrick Penna.  Chief Complaint   No chief complaint on file.    History of Present Illness   [name] is a 52 y.o. female presenting today for further evaluation of abdominal pain and diarrhea at the request of Wayland Denis, PA-C.  Anxiety DM GERD Depression HTN PVD PAD s/p bilataeral AKA (recent right) CKD Anemia Copd Bid B12 and folate def Vit D def Elevated LFTs Appy Gb Mother and sister breast cancer.  01/2023: WBC 9400, Hgb 12.3, Platelets 281, sodium 137, K 4, BUN 16, Cre 1.43H, Tbili 0.3, AP 98, AST 10, ALT 8, alb 3.5. A1C 8.9. Back pain Shoulder pain   Abd U/S 07/2021: slightly echogenic liver s/o steatosis, no biliary dilation. Surgically absent gallbladder.   EGD 09/2014: Mild erosive gastritis, Savary dilation. No h.pylori.   Liver biopsy 01/2014: *** Medications   Current Outpatient Medications  Medication Sig Dispense Refill   ALPRAZolam (XANAX) 0.5 MG tablet Take 1 tablet (0.5 mg total) by mouth 3 (three) times daily as needed for anxiety. 10 tablet 0   aspirin EC 81 MG tablet Take 81 mg by mouth daily. Swallow whole.     atorvastatin (LIPITOR) 80 MG tablet SMARTSIG:1 Tablet(s) By Mouth Every Evening     Cholecalciferol (VITAMIN D3 PO) Take 1 tablet by mouth daily.     clopidogrel (PLAVIX) 75 MG tablet Take 75 mg by mouth daily.     docusate sodium (COLACE) 100 MG capsule Take 100 mg by mouth as needed.     ENTRESTO 24-26 MG TAKE 1 TABLET BY MOUTH TWICE DAILY 60 tablet 6   escitalopram (LEXAPRO) 20 MG tablet Take 20 mg by mouth daily.     gabapentin (NEURONTIN) 400 MG capsule Take 400 mg by mouth 4 (four) times daily.     insulin glargine (LANTUS) 100 unit/mL SOPN Inject 36 Units into the skin 2 (two) times daily.     JARDIANCE 25 MG TABS tablet Take 25 mg by mouth daily.      linaclotide (LINZESS) 72 MCG capsule Take 72 mcg by mouth as needed.     methocarbamol (ROBAXIN) 500 MG tablet Take 500 mg by mouth 2 (two) times daily.     ondansetron (ZOFRAN) 4 MG tablet Take 4 mg by mouth every 6 (six) hours as needed for nausea or vomiting.     OZEMPIC, 0.25 OR 0.5 MG/DOSE, 2 MG/3ML SOPN Inject 0.5 mg into the skin once a week.     pantoprazole (PROTONIX) 40 MG tablet Take 1 tablet (40 mg total) by mouth daily.     propranolol ER (INDERAL LA) 60 MG 24 hr capsule Take 1 capsule (60 mg total) by mouth daily. 30 capsule 6   topiramate (TOPAMAX) 50 MG tablet Take 50 mg by mouth 2 (two) times daily.     vitamin B-12 1000 MCG tablet Take 1 tablet (1,000 mcg total) by mouth daily.     No current facility-administered medications for this visit.    Allergies   Allergies as of 03/30/2023   (No Known Allergies)    Past Medical History   Past Medical History:  Diagnosis Date   Anemia 02/05/2012   Anxiety    Arthritis    Asthma    CKD (chronic kidney disease) stage 3, GFR 30-59 ml/min (HCC) 09/25/2021   COPD (chronic obstructive pulmonary disease) (  HCC)    Depression    Essential hypertension    Fatty liver    GERD (gastroesophageal reflux disease)    Headache(784.0)    PAD (peripheral artery disease) (HCC)    Status post bilateral AKA   PE (pulmonary embolism)    Xarelto   Pneumonia 2013   Sepsis (HCC)    Type 2 diabetes mellitus (HCC)     Past Surgical History   Past Surgical History:  Procedure Laterality Date   ABDOMINAL AORTOGRAM W/LOWER EXTREMITY N/A 09/05/2021   Procedure: ABDOMINAL AORTOGRAM W/LOWER EXTREMITY;  Surgeon: [name], MD;  Location: [facility] INVASIVE CV LAB;  Service: Cardiovascular;  Laterality: N/A;   AMPUTATION Right 09/24/2021   Procedure: RIGHT ABOVE KNEE AMPUTATION;  Surgeon: [name], MD;  Location: [facility] OR;  Service: Vascular;  Laterality: Right;   APPENDECTOMY     CHOLECYSTECTOMY     DIAGNOSTIC LAPAROSCOPY     ESOPHAGEAL  DILATION N/A 10/09/2014   Procedure: ESOPHAGEAL DILATION;  Surgeon: West Bali, MD;  Location: [facility] ORS;  Service: Endoscopy;  Laterality: N/A;  #15 and #16 savory   ESOPHAGOGASTRODUODENOSCOPY (EGD) WITH PROPOFOL N/A 10/09/2014   Dr. Darrick Penna: mild erosive gastritis, Savary dilation. Nausea and vomiting most likely due to uncontroleld blood sugars, reflux, and gastritis.     Past Family History   Family History  Problem Relation Age of Onset   Breast cancer Mother    Kidney disease Sister    Diabetes Sister    Diabetes Brother    Colon cancer Neg Hx    Liver disease Neg Hx     Past Social History   Social History   Socioeconomic History   Marital status: Single    Spouse name: Not on file   Number of children: Not on file   Years of education: 12   Highest education level: 12th grade  Occupational History   Not on file  Tobacco Use   Smoking status: Every Day    Current packs/day: 1.00    Average packs/day: 1 pack/day for 20.0 years (20.0 ttl pk-yrs)    Types: Cigarettes    Passive exposure: Current   Smokeless tobacco: Never  Vaping Use   Vaping status: Former  Substance and Sexual Activity   Alcohol use: No   Drug use: No   Sexual activity: Yes    Birth control/protection: Injection  Other Topics Concern   Not on file  Social History Narrative   Not on file   Social Determinants of Health   Financial Resource Strain: Low Risk  (11/03/2022)   Overall Financial Resource Strain (CARDIA)    Difficulty of Paying Living Expenses: Not hard at all  Food Insecurity: No Food Insecurity (12/28/2022)   Hunger Vital Sign    Worried About Running Out of Food in the Last Year: Never true    Ran Out of Food in the Last Year: Never true  Transportation Needs: No Transportation Needs (03/18/2023)   PRAPARE - Administrator, Civil Service (Medical): No    Lack of Transportation (Non-Medical): No  Physical Activity: Inactive (03/18/2023)   Exercise Vital Sign    Days  of Exercise per Week: 0 days    Minutes of Exercise per Session: 0 min  Stress: Stress Concern Present (03/18/2022)   [employer] of Occupational Health - Occupational Stress Questionnaire    Feeling of Stress : Rather much  Social Connections: Unknown (03/18/2022)   Social Connection and Isolation Panel [NHANES]  Frequency of Communication with Friends and Family: More than three times a week    Frequency of Social Gatherings with Friends and Family: More than three times a week    Attends Religious Services: More than 4 times per year    Active Member of [employer] or Organizations: No    Attends Banker Meetings: Never    Marital Status: Not on file  Intimate Partner Violence: Not At Risk (03/18/2022)   Humiliation, Afraid, Rape, and Kick questionnaire    Fear of Current or Ex-Partner: No    Emotionally Abused: No    Physically Abused: No    Sexually Abused: No    Review of Systems   General: Negative for anorexia, weight loss, fever, chills, fatigue, weakness. Eyes: Negative for vision changes.  ENT: Negative for hoarseness, difficulty swallowing , nasal congestion. CV: Negative for chest pain, angina, palpitations, dyspnea on exertion, peripheral edema.  Respiratory: Negative for dyspnea at rest, dyspnea on exertion, cough, sputum, wheezing.  GI: See history of present illness. GU:  Negative for dysuria, hematuria, urinary incontinence, urinary frequency, nocturnal urination.  MS: Negative for joint pain, low back pain.  Derm: Negative for rash or itching.  Neuro: Negative for weakness, abnormal sensation, seizure, frequent headaches, memory loss,  confusion.  Psych: Negative for anxiety, depression, suicidal ideation, hallucinations.  Endo: Negative for unusual weight change.  Heme: Negative for bruising or bleeding. Allergy: Negative for rash or hives.  Physical Exam   There were no vitals taken for this visit.   General: Well-nourished, well-developed  in no acute distress.  Head: Normocephalic, atraumatic.   Eyes: Conjunctiva pink, no icterus. Mouth: Oropharyngeal mucosa moist and pink , no lesions erythema or exudate. Neck: Supple without thyromegaly, masses, or lymphadenopathy.  Lungs: Clear to auscultation bilaterally.  Heart: Regular rate and rhythm, no murmurs rubs or gallops.  Abdomen: Bowel sounds are normal, nontender, nondistended, no hepatosplenomegaly or masses,  no abdominal bruits or hernia, no rebound or guarding.   Rectal: *** Extremities: No lower extremity edema. No clubbing or deformities.  Neuro: Alert and oriented x 4 , grossly normal neurologically.  Skin: Warm and dry, no rash or jaundice.   Psych: Alert and cooperative, normal mood and affect.  Labs   *** Imaging Studies   No results found.  Assessment       PLAN   ***   Leanna Battles. Melvyn Neth, MHS, PA-C [facility]

## 2023-03-30 ENCOUNTER — Ambulatory Visit (INDEPENDENT_AMBULATORY_CARE_PROVIDER_SITE_OTHER): Payer: 59 | Admitting: Gastroenterology

## 2023-03-30 ENCOUNTER — Telehealth: Payer: Self-pay | Admitting: *Deleted

## 2023-03-30 ENCOUNTER — Encounter: Payer: Self-pay | Admitting: Gastroenterology

## 2023-03-30 VITALS — BP 130/77 | HR 75 | Temp 98.7°F

## 2023-03-30 DIAGNOSIS — R197 Diarrhea, unspecified: Secondary | ICD-10-CM

## 2023-03-30 DIAGNOSIS — R198 Other specified symptoms and signs involving the digestive system and abdomen: Secondary | ICD-10-CM | POA: Diagnosis not present

## 2023-03-30 DIAGNOSIS — R1011 Right upper quadrant pain: Secondary | ICD-10-CM

## 2023-03-30 DIAGNOSIS — K921 Melena: Secondary | ICD-10-CM | POA: Diagnosis not present

## 2023-03-30 DIAGNOSIS — G8929 Other chronic pain: Secondary | ICD-10-CM

## 2023-03-30 MED ORDER — POLYETHYLENE GLYCOL 3350 17 GM/SCOOP PO POWD: ORAL | 5 refills | Status: DC

## 2023-03-30 NOTE — Telephone Encounter (Signed)
[employer] Medicare PA for [place]: This member's benefit plan did not require a prior authorization for this request.  Called Medicaid Out of [place] and spoke with [place]. She says since pt had [facility] medicare as primary, they will follow her primary insurance, so if primary doesn't require a pa then they don't require one.

## 2023-03-30 NOTE — Patient Instructions (Signed)
Continue pantoprazole 40mg  daily before breakfast. Start miralax one capful twice daily, mixed in 6 ounces of liquid, until soft stool, then continue once daily.You may still use Linzess as needed but hopefully miralax will regulate stools better.  CT scan of your abdomen to further evaluate your chronic abdominal pain. We will contact you within 5 business days after we receive results. It has been taking up to a week for [place] to receive imaging results. If you have not heard from [place], please call my CMA, [name] at [phone].

## 2023-03-31 ENCOUNTER — Ambulatory Visit (INDEPENDENT_AMBULATORY_CARE_PROVIDER_SITE_OTHER): Payer: 59 | Admitting: Orthopaedic Surgery

## 2023-03-31 ENCOUNTER — Encounter: Payer: Self-pay | Admitting: Orthopaedic Surgery

## 2023-03-31 DIAGNOSIS — M7542 Impingement syndrome of left shoulder: Secondary | ICD-10-CM

## 2023-03-31 NOTE — Progress Notes (Signed)
Office Visit Note   Patient: [name]           Date of Birth: [birth year]           MRN: [id number] Visit Date: 03/31/2023              Requested by: Royann Shivers, PA-C [address] [place],  [name] [address] PCP: Royann Shivers, PA-C   Assessment & Plan: Visit Diagnoses:  1. Impingement syndrome of left shoulder     Plan: Subacromial injection were repeated since has been 6 months left shoulder for impingement.  Again we discussed stop smoking.  Follow-Up Instructions: No follow-ups on file.   Orders:  No orders of the defined types were placed in this encounter.  No orders of the defined types were placed in this encounter.     Procedures: No procedures performed   Clinical Data: No additional findings.   Subjective: Chief Complaint  Patient presents with   Left Shoulder - Pain    L/ the last injection helped. I would like to get another one.    HPI 52 year old female long-term smoker diabetes bilateral above-knee amputations from PAD with continued smoking.  I had previous injection in her shoulder 6 months ago she got relief and now she returns with ongoing problems with shoulder pain.  She has slide board she transfers with to the wheelchair.  Review of Systems all systems noncontributory HPI.   Objective: Vital Signs: There were no vitals taken for this visit.  Physical Exam Constitutional:      Appearance: She is well-developed.  HENT:     Head: Normocephalic.     Right Ear: External ear normal.     Left Ear: External ear normal. There is no impacted cerumen.  Eyes:     Pupils: Pupils are equal, round, and reactive to light.  Neck:     Thyroid: No thyromegaly.     Trachea: No tracheal deviation.  Cardiovascular:     Rate and Rhythm: Normal rate.  Pulmonary:     Effort: Pulmonary effort is normal.  Abdominal:     Palpations: Abdomen is soft.  Musculoskeletal:     Cervical back: No rigidity.     Comments: Bilateral AKA.   She is in a wheelchair.  Nicotine stains her fingertips.  Skin:    General: Skin is warm and dry.  Neurological:     Mental Status: She is alert and oriented to person, place, and time.  Psychiatric:        Behavior: Behavior normal.     Ortho Exam positive impingement left shoulder.  Specialty Comments:  No specialty comments available.  Imaging: No results found.   PMFS History: Patient Active Problem List   Diagnosis Date Noted   Impingement syndrome of left shoulder 10/22/2022   Debility 10/01/2021   CKD (chronic kidney disease) stage 3, GFR 30-59 ml/min (HCC) 09/25/2021   Hyponatremia 09/25/2021   Leukocytosis 09/25/2021   PAD (peripheral artery disease) (HCC) 09/24/2021   COPD not affecting current episode of care 09/24/2021   Tobacco dependence 09/24/2021   Nausea with vomiting 03/12/2016   Dysphagia, pharyngoesophageal phase 08/24/2014   Constipation 04/20/2014   Liver abscess 02/21/2014   RUQ pain 01/31/2014   Septic shock(785.52) 02/05/2012   Hypokalemia 02/05/2012   Normocytic anemia 02/05/2012   Acute respiratory failure with hypoxia (HCC) 01/28/2012   Appendicitis 01/28/2012   ARDS (adult respiratory distress syndrome) (HCC) 01/28/2012   Diabetes mellitus (HCC) 01/28/2012  Asthma 01/28/2012   Hypertension 01/28/2012   Past Medical History:  Diagnosis Date   Anemia 02/05/2012   Anxiety    Arthritis    Asthma    B12 deficiency    CKD (chronic kidney disease) stage 3, GFR 30-59 ml/min (HCC) 09/25/2021   COPD (chronic obstructive pulmonary disease) (HCC)    Depression    Diabetes (HCC)    Elevated LFTs    Essential hypertension    Fatty liver    Folate deficiency    GERD (gastroesophageal reflux disease)    Headache(784.0)    Heart failure (HCC)    PAD (peripheral artery disease) (HCC)    Status post bilateral AKA   PE (pulmonary embolism)    Xarelto   Pneumonia 2013   PVC (premature ventricular contraction)    Sepsis (HCC)    Type 2  diabetes mellitus (HCC)    Vitamin D deficiency     Family History  Problem Relation Age of Onset   Breast cancer Mother    Kidney disease Sister    Diabetes Sister    Breast cancer Sister    Diabetes Brother    Colon cancer Neg Hx    Liver disease Neg Hx     Past Surgical History:  Procedure Laterality Date   ABDOMINAL AORTOGRAM W/LOWER EXTREMITY N/A 09/05/2021   Procedure: ABDOMINAL AORTOGRAM W/LOWER EXTREMITY;  Surgeon: [name], MD;  Location: [facility] INVASIVE [facility];  Service: Cardiovascular;  Laterality: N/A;   AMPUTATION Right 09/24/2021   Procedure: RIGHT ABOVE KNEE AMPUTATION;  Surgeon: [name], MD;  Location: [facility] OR;  Service: Vascular;  Laterality: Right;   APPENDECTOMY  2013   CHOLECYSTECTOMY     DIAGNOSTIC LAPAROSCOPY     ESOPHAGEAL DILATION N/A 10/09/2014   Procedure: ESOPHAGEAL DILATION;  Surgeon: West Bali, MD;  Location: [facility] ORS;  Service: Endoscopy;  Laterality: N/A;  #15 and #16 savory   ESOPHAGOGASTRODUODENOSCOPY (EGD) WITH PROPOFOL N/A 10/09/2014   Dr. Darrick Penna: mild erosive gastritis, Savary dilation. Nausea and vomiting most likely due to uncontroleld blood sugars, reflux, and gastritis.    Social History   Occupational History   Not on file  Tobacco Use   Smoking status: Every Day    Current packs/day: 1.00    Average packs/day: 1 pack/day for 20.0 years (20.0 ttl pk-yrs)    Types: Cigarettes    Passive exposure: Current   Smokeless tobacco: Never  Vaping Use   Vaping status: Former  Substance and Sexual Activity   Alcohol use: No   Drug use: No   Sexual activity: Yes    Birth control/protection: Injection

## 2023-04-20 NOTE — Telephone Encounter (Addendum)
CT called from [facility]. Needs orders for BUN/CREAT faxed to them at [phone] prior to CT tomorrow.

## 2023-04-20 NOTE — Addendum Note (Signed)
Addended by: Armstead Peaks on: 04/20/2023 11:54 AM   Modules accepted: Orders

## 2023-04-21 ENCOUNTER — Encounter: Payer: Self-pay | Admitting: *Deleted

## 2023-04-21 NOTE — Patient Outreach (Signed)
Care Coordination   Documentation  Visit Note   04/21/2023 Name: [name] MRN: [id number] DOB: 1971-04-03  GLENN BUSSARD is a 52 y.o. year old female who sees [place], [name], [place] for primary care. I  previously spoke with Jeanella Anton at [facility] and [facility] Services at [phone] regarding care management services.   What matters to the patients health and wellness today?  I did not speak with the patient today.     Goals Addressed             This Visit's Progress    COMPLETED: Manage Blood Sugar       Care Coordination Goals: Patient will follow-up with Steve Rattler, PharmD with [facility] next month as scheduled Patient will increase Lantus to 30 units twice a day as instructed at appointment yesterday Patient will monitor blood sugar at least 4 times a day with freestyle libre continuous glucose monitor Patient will use alarms on glucose monitor to alert her of high or low blood sugar Patient will eat 3 meals per day with 30 GM of CHO each and up to 2 snacks per day with less than 15 GM of CHO Patient stop drinking [facility] or at least cut back until she can stop.  Patient will supplement with Glucerna or sugar free boost if appetite is too poor to eat Patient will state understanding of the importance of eating meals at regular intervals since she is on a long acting insulin Patient will consider talking with a registered dietician Patient will reach out to RN Care Coordinator [phone] with any care coordination or resource needs  Patient is not a [facility] Member. Mailed information on Care Management through [employer]. PCP has been notified.      COMPLETED: Prevent Pressure Wounds       Care Coordination Goals: Patient will change positions at least every two hours Use pillows or rolls to assist with positioning Sister will assist with application of barrier creams on pressure points  Continue to  use gel overlay mattress and gel cushion in wheelchair with coccyx cutout Eat a healthy diet with protein Clean and urine or fecal matter off as soon as possible Use mild soap and rinse well with water Sister will assess skin daily Reach out to provider if any discolored areas are noted or if there are any changes in the skin  Patient is not a [facility] Member. Mailed information on Care Management through [employer]. PCP has been notified.      COMPLETED: Resolve Nausea, Vomiting, and Diarrhea       Care Coordination Goals: Keep appointment with gastroenterologist for 03/19/23 to discuss symptoms Nausea persists. Vomiting and diarrhea have improved. Reach out to provider with any new or worsening symptoms Avoid spicy, greasy, fried foods, and diary Patient will call RN Care Coordinator with any resource or care coordination needs [phone]  Patient is not a [facility] Member. Mailed information on Care Management through [employer]. PCP has been notified.         SDOH assessments and interventions completed:  No     Care Coordination Interventions:  Yes, provided  Interventions Today    Flowsheet Row Most Recent Value  Chronic Disease   Chronic disease during today's visit Diabetes, Chronic Kidney Disease/End Stage Renal Disease (ESRD)  General Interventions   General Interventions Discussed/Reviewed Communication with  Communication with PCP/Specialists  [communicated with PCP office regarding patient not being  enrolled with [facility] and potential need for care management services through their office or payor. They are to reach out to patient.]  Education Interventions   Education Provided Provided Printed Education  [Printed and mailed letter on Care Management contact information for [employer]]       Follow up plan: No further intervention required. Removed myself from care team.    Encounter Outcome:  Patient Visit Completed   Demetrios Loll, RN, BSN Care Management Coordinator [facility]  [facility] Direct Dial: [phone] Main #: [phone]

## 2023-04-30 ENCOUNTER — Encounter: Payer: Self-pay | Admitting: Cardiovascular Disease

## 2023-04-30 ENCOUNTER — Ambulatory Visit: Payer: 59 | Attending: Cardiovascular Disease | Admitting: Cardiovascular Disease

## 2023-04-30 VITALS — BP 118/68 | HR 82 | Wt 191.0 lb

## 2023-04-30 DIAGNOSIS — R5383 Other fatigue: Secondary | ICD-10-CM | POA: Diagnosis not present

## 2023-04-30 DIAGNOSIS — R002 Palpitations: Secondary | ICD-10-CM | POA: Diagnosis not present

## 2023-04-30 DIAGNOSIS — I10 Essential (primary) hypertension: Secondary | ICD-10-CM

## 2023-04-30 MED ORDER — PROPRANOLOL HCL ER 120 MG PO CP24: 120.0000 mg | ORAL_CAPSULE | Freq: Every day | ORAL | 6 refills | Status: DC

## 2023-04-30 NOTE — Patient Instructions (Addendum)
Medication Instructions:   Increase Propranolol ER to 120mg  daily  Begin Magnesium Taurate 400mg  daily  Continue all other medications.     Labwork:  none  Testing/Procedures:  Your physician has requested that you have an echocardiogram. Echocardiography is a painless test that uses sound waves to create images of your heart. It provides your doctor with information about the size and shape of your heart and how well your heart's chambers and valves are working. This procedure takes approximately one hour. There are no restrictions for this procedure. Please do NOT wear cologne, perfume, aftershave, or lotions (deodorant is allowed). Please arrive 15 minutes prior to your appointment time.   Follow-Up:  8 weeks   Any Other Special Instructions Will Be Listed Below (If Applicable).   If you need a refill on your cardiac medications before your next appointment, please call your pharmacy.

## 2023-04-30 NOTE — Progress Notes (Signed)
Electrophysiology Office Note:    Date:  04/30/2023   ID:  [name], DOB 09-Oct-1970, MRN [id number]  PCP:  [name]   [facility] Providers Cardiologist:  Nona Dell, MD Electrophysiologist:  Maurice Small, MD     Referring MD: Royann Shivers, *   History of Present Illness:    [name] is a 52 y.o. female with a medical history significant for heart failure with slightly decreased ejection fraction, frequent PVCs, hypertension, PAD sepsis bilateral above-the-knee amputations, CKD 3, referred for management of PVCs.     She first came to the attention of cardiology in July 2023 when she saw Dr. Diona Browner for frequent palpitations and PVCs.  Her ejection fraction at that time was reduced at 40%, and a Zio patch showed a PVC burden of 5.4%.  After starting GDMT, her EF normalized.  In follow-up, the patient continued to notice palpitations, fatigue.  Repeat monitor showed a PVC burden of 24%      Today, she reports that she is at baseline.  She is consistently fatigued.  The switch to atenolol from metoprolol did not seem to help very much.  She has episodes of palpitations frequently.  She is not sure if these correlate with increase in fatigue.    She continues to smoke cigarettes.   EKGs/Labs/Other Studies Reviewed Today:    Echocardiogram:  07/16/2022 EF 50 to 55%, mild mitral regurgitation   Monitors:  Zio 10d 08/2022 Sinus rhythm heart rate 70-125 beats minute, average 84 23.7% PVC burden, no symptoms reported no other arrhythmia detected   Stress testing:  09/15/2022 No evidence of ischemia. Frequent PVCs noted  Advanced imaging:   Cardiac catherization   EKG:   EKG Interpretation Date/Time:  Friday April 30 2023 13:36:44 EDT Ventricular Rate:  81 PR Interval:  178 QRS Duration:  94 QT Interval:  400 QTC Calculation: 464 R Axis:   125  Text Interpretation: Sinus rhythm with frequent  Premature ventricular complexes Incomplete right bundle branch block Possible Right ventricular hypertrophy When compared with ECG of 26-Feb-2023 11:36, T wave inversion now evident in Inferior leads Confirmed by York Pellant [phone]) on 04/30/2023 2:12:22 PM  Reviewed today.  PVCs are + II, - III, isoelectric aVF. - V1, + V2, -V3; +I   Physical Exam:    VS:  BP 118/68   Pulse 82   Wt 191 lb (86.6 kg)   SpO2 97%   BMI 30.83 kg/m     Wt Readings from Last 3 Encounters:  04/30/23 191 lb (86.6 kg)  12/01/22 191 lb (86.6 kg)  10/22/22 191 lb (86.6 kg)     GEN: Well nourished, well developed in no acute distress CARDIAC: RRR with occasional abnormal beat, no murmurs, rubs, gallops RESPIRATORY:  Normal work of breathing MUSCULOSKELETAL: Status post bilateral AKA    ASSESSMENT & PLAN:    Frequent PVCs Symptomatic with mild palpitations She also has fatigue, but it is uncertain whether these correlate with the PVCs Increase propranolol to 120 mg Try magnesium taurate 400mg   CHF with recovered ejection fraction EF has improved despite PVCs --I do not think that she had a PVC induced cardiomyopathy Although the propranolol is not a CHF beta-blocker, I think will be overall benefit if it is more effective in suppressing her PVCs  Tobacco abuse Smoking cessation encouraged  Vascular disease --peripheral, carotid, coronary On aspirin 81, Lipitor 80, clopidogrel 75   Signed, Maurice Small, MD  04/30/2023  2:14 PM    [facility]

## 2023-04-30 NOTE — Addendum Note (Signed)
Addended by: Lesle Chris on: 04/30/2023 02:29 PM   Modules accepted: Orders

## 2023-05-20 ENCOUNTER — Ambulatory Visit: Payer: 59 | Attending: Cardiovascular Disease

## 2023-05-20 DIAGNOSIS — I340 Nonrheumatic mitral (valve) insufficiency: Secondary | ICD-10-CM

## 2023-05-20 DIAGNOSIS — R002 Palpitations: Secondary | ICD-10-CM

## 2023-05-20 DIAGNOSIS — R5383 Other fatigue: Secondary | ICD-10-CM

## 2023-05-20 LAB — ECHOCARDIOGRAM COMPLETE
AR max vel: 1.35 cm2
AV Area VTI: 1.37 cm2
AV Area mean vel: 1.5 cm2
AV Mean grad: 6 mm[Hg]
AV Peak grad: 16.8 mm[Hg]
Ao pk vel: 2.05 m/s
Area-P 1/2: 3.27 cm2
Calc EF: 39.5 %
MV M vel: 6.01 m/s
MV Peak grad: 144.5 mm[Hg]
MV VTI: 1.19 cm2
P 1/2 time: 388 ms
S' Lateral: 4.1 cm
Single Plane A2C EF: 40.7 %
Single Plane A4C EF: 41 %

## 2023-05-31 ENCOUNTER — Telehealth: Payer: Self-pay | Admitting: Gastroenterology

## 2023-05-31 NOTE — Telephone Encounter (Signed)
Reviewed PCP labs dated February 22, 2023: White blood cell count 9400, hemoglobin 12.3, platelets 281,000, creatinine 1.43, albumin 3.5, total bilirubin 0.3, alkaline phosphatase 98, AST 10, ALT 8, A1c 8.9, TSH 3.234  Just seeing that she had the CT I ordered at [facility].  Possible mild irregular wall thickening along second portion of duodenum but this could be due to underdistention or peristalsis. Otherwise CT unremarkable.   I would offer her follow up ov to see how she is doing and to decide if EGD is needed.

## 2023-05-31 NOTE — Telephone Encounter (Signed)
Pt was made aware and appt was made

## 2023-06-13 NOTE — Progress Notes (Deleted)
GI Office Note    Referring Provider: Royann Shivers, * Primary Care Physician:  [name]  Primary Gastroenterologist:  Chief Complaint   No chief complaint on file.   History of Present Illness   [name] is a 52 y.o. female presenting today   For follow up. Last seen 03/2023. She has history of liver abscess (Candida albicans) years ago. She has chronic RUQ pain.    Reviewed PCP labs dated February 22, 2023: White blood cell count 9400, hemoglobin 12.3, platelets 281,000, creatinine 1.43, albumin 3.5, total bilirubin 0.3, alkaline phosphatase 98, AST 10, ALT 8, A1c 8.9, TSH 3.234   Just seeing that she had the CT I ordered at [facility].  Possible mild irregular wall thickening along second portion of duodenum but this could be due to underdistention or peristalsis. Otherwise CT unremarkable.    I would offer her follow up ov to see how she is doing and to decide if EGD is needed.     Prior work up: Abd U/S 07/2021: slightly echogenic liver s/o steatosis, no biliary dilation. Surgically absent gallbladder.    No prior colonoscopy.   EGD 09/2014: Mild erosive gastritis, Savary dilation. No h.pylori.   Liver, biopsy, Right (01/2014) - FIBROSIS AND DENSE INFLAMMATION, CONSISTENT WITH INFLAMMATION/ABSCESS. - THERE IS NO EVIDENCE OF MALIGNANCY. - SEE COMMENT. Microscopic Comment Cytokeratin AE1/AE3, cytokeratin 20, and cytokeratin 7 stains fail to highlight the presence of infiltrating malignancy. Estrogen receptor and Hepar1 immunohistochemical stains are negative. CD20 and CD68 stains highlight the presence of the inflammatory cells. PAS-F and GMS stains are negative for the presence of microorganisms. A trichrome stain highlights the dense fibrosis. Please also see the patients concurrent microbiology specimen. Overall, the findings suggest a inflammatory/infectious process. (JBK:gt, 02/16/14)  Medications   Current Outpatient Medications   Medication Sig Dispense Refill   ALPRAZolam (XANAX) 0.5 MG tablet Take 1 tablet (0.5 mg total) by mouth 3 (three) times daily as needed for anxiety. 10 tablet 0   amLODipine (NORVASC) 2.5 MG tablet Take 2.5 mg by mouth daily.     aspirin EC 81 MG tablet Take 81 mg by mouth daily. Swallow whole.     atorvastatin (LIPITOR) 80 MG tablet SMARTSIG:1 Tablet(s) By Mouth Every Evening     buPROPion (WELLBUTRIN XL) 150 MG 24 hr tablet Take 150 mg by mouth every morning.     Cholecalciferol (VITAMIN D3 PO) Take 1 tablet by mouth daily.     clopidogrel (PLAVIX) 75 MG tablet Take 75 mg by mouth daily.     docusate sodium (COLACE) 100 MG capsule Take 100 mg by mouth as needed.     ENTRESTO 24-26 MG TAKE 1 TABLET BY MOUTH TWICE DAILY 60 tablet 6   escitalopram (LEXAPRO) 20 MG tablet Take 20 mg by mouth daily.     gabapentin (NEURONTIN) 400 MG capsule Take 400 mg by mouth 4 (four) times daily.     insulin glargine (LANTUS) 100 unit/mL SOPN Inject 36 Units into the skin 2 (two) times daily.     JARDIANCE 25 MG TABS tablet Take 25 mg by mouth daily.     linaclotide (LINZESS) 72 MCG capsule Take 72 mcg by mouth as needed.     methocarbamol (ROBAXIN) 500 MG tablet Take 500 mg by mouth 2 (two) times daily.     ondansetron (ZOFRAN) 4 MG tablet Take 4 mg by mouth every 6 (six) hours as needed for nausea or vomiting.  OZEMPIC, 0.25 OR 0.5 MG/DOSE, 2 MG/3ML SOPN Inject 0.5 mg into the skin once a week.     pantoprazole (PROTONIX) 40 MG tablet Take 1 tablet (40 mg total) by mouth daily.     polyethylene glycol powder (GLYCOLAX/MIRALAX) 17 GM/SCOOP powder Take one capful in 6 ounces of liquid twice daily until soft stool, then continue once daily. 527 g 5   propranolol ER (INDERAL LA) 120 MG 24 hr capsule Take 1 capsule (120 mg total) by mouth daily. 30 capsule 6   topiramate (TOPAMAX) 50 MG tablet Take 50 mg by mouth 2 (two) times daily.     vitamin B-12 1000 MCG tablet Take 1 tablet (1,000 mcg total) by mouth  daily.     No current facility-administered medications for this visit.    Allergies   Allergies as of 06/14/2023   (No Known Allergies)     Past Medical History   Past Medical History:  Diagnosis Date   Anemia 02/05/2012   Anxiety    Arthritis    Asthma    B12 deficiency    CKD (chronic kidney disease) stage 3, GFR 30-59 ml/min (HCC) 09/25/2021   COPD (chronic obstructive pulmonary disease) (HCC)    Depression    Diabetes (HCC)    Elevated LFTs    Essential hypertension    Fatty liver    Folate deficiency    GERD (gastroesophageal reflux disease)    Headache(784.0)    Heart failure (HCC)    PAD (peripheral artery disease) (HCC)    Status post bilateral AKA   PE (pulmonary embolism)    Xarelto   Pneumonia 2013   PVC (premature ventricular contraction)    Sepsis (HCC)    Type 2 diabetes mellitus (HCC)    Vitamin D deficiency     Past Surgical History   Past Surgical History:  Procedure Laterality Date   ABDOMINAL AORTOGRAM W/LOWER EXTREMITY N/A 09/05/2021   Procedure: ABDOMINAL AORTOGRAM W/LOWER EXTREMITY;  Surgeon: [name], MD;  Location: [facility];  Service: Cardiovascular;  Laterality: N/A;   AMPUTATION Right 09/24/2021   Procedure: RIGHT ABOVE KNEE AMPUTATION;  Surgeon: [name], MD;  Location: [facility] OR;  Service: Vascular;  Laterality: Right;   APPENDECTOMY  2013   CHOLECYSTECTOMY     DIAGNOSTIC LAPAROSCOPY     ESOPHAGEAL DILATION N/A 10/09/2014   Procedure: ESOPHAGEAL DILATION;  Surgeon: West Bali, MD;  Location: [facility]S;  Service: Endoscopy;  Laterality: N/A;  #15 and #16 savory   ESOPHAGOGASTRODUODENOSCOPY (EGD) WITH PROPOFOL N/A 10/09/2014   Dr. Darrick Penna: mild erosive gastritis, Savary dilation. Nausea and vomiting most likely due to uncontroleld blood sugars, reflux, and gastritis.     Past Family History   Family History  Problem Relation Age of Onset   Breast cancer Mother    Kidney disease Sister    Diabetes Sister     Breast cancer Sister    Diabetes Brother    Colon cancer Neg Hx    Liver disease Neg Hx     Past Social History   Social History   Socioeconomic History   Marital status: Single    Spouse name: Not on file   Number of children: Not on file   Years of education: 12   Highest education level: 12th grade  Occupational History   Not on file  Tobacco Use   Smoking status: Every Day    Current packs/day: 1.00    Average packs/day: 1 pack/day for  20.0 years (20.0 ttl pk-yrs)    Types: Cigarettes    Passive exposure: Current   Smokeless tobacco: Never  Vaping Use   Vaping status: Former  Substance and Sexual Activity   Alcohol use: No   Drug use: No   Sexual activity: Yes    Birth control/protection: Injection  Other Topics Concern   Not on file  Social History Narrative   Not on file   Social Determinants of Health   Financial Resource Strain: Low Risk  (11/03/2022)   Overall Financial Resource Strain (CARDIA)    Difficulty of Paying Living Expenses: Not hard at all  Food Insecurity: No Food Insecurity (12/28/2022)   Hunger Vital Sign    Worried About Running Out of Food in the Last Year: Never true    Ran Out of Food in the Last Year: Never true  Transportation Needs: No Transportation Needs (03/18/2023)   PRAPARE - Administrator, Civil Service (Medical): No    Lack of Transportation (Non-Medical): No  Physical Activity: Inactive (03/18/2023)   Exercise Vital Sign    Days of Exercise per Week: 0 days    Minutes of Exercise per Session: 0 min  Stress: Stress Concern Present (03/18/2022)   [employer] of Occupational Health - Occupational Stress Questionnaire    Feeling of Stress : Rather much  Social Connections: Unknown (03/18/2022)   Social Connection and Isolation Panel [NHANES]    Frequency of Communication with Friends and Family: More than three times a week    Frequency of Social Gatherings with Friends and Family: More than three times a week     Attends Religious Services: More than 4 times per year    Active Member of [employer] or Organizations: No    Attends Banker Meetings: Never    Marital Status: Not on file  Intimate Partner Violence: Not At Risk (03/18/2022)   Humiliation, Afraid, Rape, and Kick questionnaire    Fear of Current or Ex-Partner: No    Emotionally Abused: No    Physically Abused: No    Sexually Abused: No    Review of Systems   General: Negative for anorexia, weight loss, fever, chills, fatigue, weakness. ENT: Negative for hoarseness, difficulty swallowing , nasal congestion. CV: Negative for chest pain, angina, palpitations, dyspnea on exertion, peripheral edema.  Respiratory: Negative for dyspnea at rest, dyspnea on exertion, cough, sputum, wheezing.  GI: See history of present illness. GU:  Negative for dysuria, hematuria, urinary incontinence, urinary frequency, nocturnal urination.  Endo: Negative for unusual weight change.     Physical Exam   There were no vitals taken for this visit.   General: Well-nourished, well-developed in no acute distress.  Eyes: No icterus. Mouth: Oropharyngeal mucosa moist and pink , no lesions erythema or exudate. Lungs: Clear to auscultation bilaterally.  Heart: Regular rate and rhythm, no murmurs rubs or gallops.  Abdomen: Bowel sounds are normal, nontender, nondistended, no hepatosplenomegaly or masses,  no abdominal bruits or hernia , no rebound or guarding.  Rectal: ***  Extremities: No lower extremity edema. No clubbing or deformities. Neuro: Alert and oriented x 4   Skin: Warm and dry, no jaundice.   Psych: Alert and cooperative, normal mood and affect.  Labs   *** Imaging Studies   ECHOCARDIOGRAM COMPLETE  Result Date: 05/20/2023    ECHOCARDIOGRAM REPORT   Patient Name:   [name] Date of Exam: 05/20/2023 Medical Rec #:  [id number]       Height:  66.0 in Accession #:    [id number]      Weight:       191.0 lb Date of Birth:   14-May-1971       BSA:          1.961 m Patient Age:    52 years        BP:           118/68 mmHg Patient Gender: F               HR:           90 bpm. Exam Location:  [place] Procedure: 2D Echo Indications:    R00.2 Palpitations  History:        Patient has prior history of Echocardiogram examinations, most                 recent 07/16/2022. CHF, CAD, PAD, COPD and Chronic kidney                 disease, Arrythmias:PVC and PAC; Risk Factors:Current Smoker,                 Hypertension, Diabetes and Dyslipidemia.  Sonographer:    Dominica Severin RCS, RVS Referring Phys: [id number] Maurice Small  Sonographer Comments: Global longitudinal strain was attempted. IMPRESSIONS  1. Left ventricular ejection fraction, by estimation, is 45 to 50%. The left ventricle has mildly decreased function. The left ventricle has no regional wall motion abnormalities. Left ventricular diastolic parameters are indeterminate. Elevated left ventricular end-diastolic pressure.  2. Right ventricular systolic function is normal. The right ventricular size is normal. Tricuspid regurgitation signal is inadequate for assessing PA pressure.  3. A small pericardial effusion is present. The pericardial effusion is anterior to the right ventricle and localized near the right atrium.  4. The mitral valve is normal in structure. Mild mitral valve regurgitation. No evidence of mitral stenosis.  5. The aortic valve has an indeterminant number of cusps. Aortic valve regurgitation is mild. No aortic stenosis is present.  6. The inferior vena cava is normal in size with greater than 50% respiratory variability, suggesting right atrial pressure of 3 mmHg. Comparison(s): Changes from prior study are noted. Stable LVEF, 45-50% now compared to 50-55% previously. FINDINGS  Left Ventricle: Left ventricular ejection fraction, by estimation, is 45 to 50%. The left ventricle has mildly decreased function. The left ventricle has no regional wall motion abnormalities. The  left ventricular internal cavity size was normal in size. There is no left ventricular hypertrophy. Left ventricular diastolic parameters are indeterminate. Elevated left ventricular end-diastolic pressure. Right Ventricle: The right ventricular size is normal. No increase in right ventricular wall thickness. Right ventricular systolic function is normal. Tricuspid regurgitation signal is inadequate for assessing PA pressure. Left Atrium: Left atrial size was normal in size. Right Atrium: Right atrial size was normal in size. Pericardium: A small pericardial effusion is present. The pericardial effusion is anterior to the right ventricle and localized near the right atrium. Mitral Valve: The mitral valve is normal in structure. Mild mitral valve regurgitation. No evidence of mitral valve stenosis. MV peak gradient, 7.6 mmHg. The mean mitral valve gradient is 3.0 mmHg. Tricuspid Valve: The tricuspid valve is grossly normal. Tricuspid valve regurgitation is not demonstrated. No evidence of tricuspid stenosis. Aortic Valve: The aortic valve has an indeterminant number of cusps. Aortic valve regurgitation is mild. Aortic regurgitation PHT measures 388 msec. No aortic stenosis is present. Aortic valve mean gradient measures 6.0 mmHg.  Aortic valve peak gradient measures 16.8 mmHg. Aortic valve area, by VTI measures 1.37 cm. Pulmonic Valve: The pulmonic valve was not well visualized. Pulmonic valve regurgitation is trivial. No evidence of pulmonic stenosis. Aorta: The aortic root and ascending aorta are structurally normal, with no evidence of dilitation. Venous: The inferior vena cava is normal in size with greater than 50% respiratory variability, suggesting right atrial pressure of 3 mmHg. IAS/Shunts: No atrial level shunt detected by color flow Doppler.  LEFT VENTRICLE PLAX 2D LVIDd:         5.30 cm      Diastology LVIDs:         4.10 cm      LV e' medial:    5.33 cm/s LV PW:         1.00 cm      LV E/e' medial:  22.1  LV IVS:        1.00 cm      LV e' lateral:   4.68 cm/s LVOT diam:     2.00 cm      LV E/e' lateral: 25.2 LV SV:         46 LV SV Index:   24 LVOT Area:     3.14 cm  LV Volumes (MOD) LV vol d, MOD A2C: 122.0 ml LV vol d, MOD A4C: 55.4 ml LV vol s, MOD A2C: 72.3 ml LV vol s, MOD A4C: 32.7 ml LV SV MOD A2C:     49.7 ml LV SV MOD A4C:     55.4 ml LV SV MOD BP:      34.0 ml RIGHT VENTRICLE RV Basal diam:  2.50 cm RV Mid diam:    2.20 cm RV S prime:     15.70 cm/s TAPSE (M-mode): 2.7 cm LEFT ATRIUM             Index        RIGHT ATRIUM           Index LA diam:        4.00 cm 2.04 cm/m   RA Area:     10.70 cm LA Vol (A2C):   58.6 ml 29.88 ml/m  RA Volume:   20.60 ml  10.50 ml/m LA Vol (A4C):   50.5 ml 25.75 ml/m LA Biplane Vol: 54.8 ml 27.94 ml/m  AORTIC VALVE                     PULMONIC VALVE AV Area (Vmax):    1.35 cm      PV Vmax:       0.86 m/s AV Area (Vmean):   1.50 cm      PV Peak grad:  3.0 mmHg AV Area (VTI):     1.37 cm AV Vmax:           205.00 cm/s AV Vmean:          113.000 cm/s AV VTI:            0.337 m AV Peak Grad:      16.8 mmHg AV Mean Grad:      6.0 mmHg LVOT Vmax:         88.30 cm/s LVOT Vmean:        54.100 cm/s LVOT VTI:          0.147 m LVOT/AV VTI ratio: 0.44 AI PHT:            388 msec  AORTA Ao Root diam: 2.60 cm Ao  Asc diam:  2.90 cm MITRAL VALVE MV Area (PHT): 3.27 cm     SHUNTS MV Area VTI:   1.19 cm     Systemic VTI:  0.15 m MV Peak grad:  7.6 mmHg     Systemic Diam: 2.00 cm MV Mean grad:  3.0 mmHg MV Vmax:       1.38 m/s MV Vmean:      80.5 cm/s MV Decel Time: 232 msec MR Peak grad: 144.5 mmHg MR Mean grad: 100.0 mmHg MR Vmax:      601.00 cm/s MR Vmean:     483.0 cm/s MV E velocity: 118.00 cm/s MV A velocity: 120.00 cm/s MV E/A ratio:  0.98 Vishnu Priya Mallipeddi Electronically signed by Winfield Rast Mallipeddi Signature Date/Time: 05/20/2023/12:36:52 PM    Final     Assessment       PLAN   ***   Leanna Battles. Melvyn Neth, MHS, PA-C [facility]

## 2023-06-14 ENCOUNTER — Ambulatory Visit: Payer: 59 | Admitting: Gastroenterology

## 2023-06-18 ENCOUNTER — Ambulatory Visit (INDEPENDENT_AMBULATORY_CARE_PROVIDER_SITE_OTHER): Payer: 59 | Admitting: Gastroenterology

## 2023-06-18 ENCOUNTER — Telehealth: Payer: Self-pay | Admitting: *Deleted

## 2023-06-18 ENCOUNTER — Encounter: Payer: Self-pay | Admitting: Gastroenterology

## 2023-06-18 VITALS — BP 157/66 | HR 69 | Temp 98.3°F

## 2023-06-18 DIAGNOSIS — R1011 Right upper quadrant pain: Secondary | ICD-10-CM

## 2023-06-18 DIAGNOSIS — K5900 Constipation, unspecified: Secondary | ICD-10-CM

## 2023-06-18 DIAGNOSIS — R1310 Dysphagia, unspecified: Secondary | ICD-10-CM

## 2023-06-18 DIAGNOSIS — G8929 Other chronic pain: Secondary | ICD-10-CM

## 2023-06-18 DIAGNOSIS — R933 Abnormal findings on diagnostic imaging of other parts of digestive tract: Secondary | ICD-10-CM | POA: Insufficient documentation

## 2023-06-18 DIAGNOSIS — K219 Gastro-esophageal reflux disease without esophagitis: Secondary | ICD-10-CM

## 2023-06-18 NOTE — Telephone Encounter (Signed)
  Request for patient to stop medication prior to procedure or is needing cleareance  06/18/23  [name] [birth year]  What type of surgery is being performed? Colonoscopy/ Esophagogastroduodenoscopy (EGD) with esophageal dilation  When is surgery scheduled? TBD  What type of clearance is required (medical or pharmacy to hold medication or both? medication  Are there any medications that need to be held prior to surgery and how long? Plavix x 5 days  Name of physician performing surgery?  Dr.[facility] [employer]: [phone] Fax: [phone]  Anethesia type (none, local, MAC, general)? MAC

## 2023-06-18 NOTE — Patient Instructions (Signed)
We will call to get your scheduled for upper endoscopy and colonoscopy.  Please use miralax two capfuls daily until soft stool, then continue one capful daily as needed. If you don't have a bowel movement for 24 hours, you should take a dose of miralax.  You may continue to use Linzess daily as needed as well.

## 2023-06-18 NOTE — Progress Notes (Addendum)
GI Office Note    Referring Provider: Royann Shivers, * Primary Care Physician:  [name]  Primary Gastroenterologist: Roetta Sessions, MD    Chief Complaint   Chief Complaint  Patient presents with   Follow-up    Still having issues with RUQ pain    History of Present Illness   [name] is a 52 y.o. female presenting today for follow up. Last seen 03/2023. She has history of liver abscess (Candida albicans) years ago. She has chronic RUQ pain.   She has history of chronic right upper quadrant pain since appendectomy several years ago and noted enlarging liver lesion on CT. MRI completed showing small hepatic abscess and marketed fatty liver. Liver abscess biopsy with cultures revealed candida albicans. Consulted infectious disease recommended continuing Augmentin, start Diflucan and proceed up blood cultures which were negative 2. Repeat MRI in 2016 with near-complete resolution of abscess.     Today:After last ov, she completed labs and CT. labs completed February 22, 2023.  White blood cell count 9400, hemoglobin 12.3, platelets 281,000, creatinine 1.43, albumin 3.5, total bilirubin 0.3, alkaline phosphatase 98, AST 10, ALT 8, A1c 8.9, TSH 3.234.  CT completed showing possible mild irregular wall thickening along the second portion of duodenum but could also represent underdistention peristalsis.  Patient continues to have right upper quadrant pain.  Not affected by meals.  Sometimes worse with movement.  Denies significant heartburn on PPI.  She does have some solid food dysphagia.  She has some poor teeth, states she needs to have multiple teeth pulled, tries to chew her food thoroughly.  Having a bowel movement 1-2 times per week, stools are soft.  Uses bedpan due to her bilateral lower extremity amputation and immobility.  Currently takes MiraLAX.  Uses Linzess as needed.  Denies any melena or rectal bleeding.  No nausea or vomiting.  Prior work  up: Abd U/S 07/2021: slightly echogenic liver s/o steatosis, no biliary dilation. Surgically absent gallbladder.    No prior colonoscopy.   EGD 09/2014: Mild erosive gastritis, Savary dilation. No h.pylori.   Liver, biopsy, Right (01/2014) - FIBROSIS AND DENSE INFLAMMATION, CONSISTENT WITH INFLAMMATION/ABSCESS. - THERE IS NO EVIDENCE OF MALIGNANCY. - SEE COMMENT. Microscopic Comment Cytokeratin AE1/AE3, cytokeratin 20, and cytokeratin 7 stains fail to highlight the presence of infiltrating malignancy. Estrogen receptor and Hepar1 immunohistochemical stains are negative. CD20 and CD68 stains highlight the presence of the inflammatory cells. PAS-F and GMS stains are negative for the presence of microorganisms. A trichrome stain highlights the dense fibrosis. Please also see the patients concurrent microbiology specimen. Overall, the findings suggest a inflammatory/infectious process. (JBK:gt, 02/16/14)  Medications   Current Outpatient Medications  Medication Sig Dispense Refill   ALPRAZolam (XANAX) 0.5 MG tablet Take 1 tablet (0.5 mg total) by mouth 3 (three) times daily as needed for anxiety. 10 tablet 0   amLODipine (NORVASC) 2.5 MG tablet Take 2.5 mg by mouth daily.     aspirin EC 81 MG tablet Take 81 mg by mouth daily. Swallow whole.     clopidogrel (PLAVIX) 75 MG tablet Take 75 mg by mouth daily.     docusate sodium (COLACE) 100 MG capsule Take 100 mg by mouth as needed.     doxycycline (VIBRA-TABS) 100 MG tablet Take 100 mg by mouth 2 (two) times daily.     DULoxetine (CYMBALTA) 30 MG capsule Take 30 mg by mouth daily.     ENTRESTO 24-26 MG TAKE 1 TABLET  BY MOUTH TWICE DAILY 60 tablet 6   gabapentin (NEURONTIN) 400 MG capsule Take 400 mg by mouth 4 (four) times daily.     insulin glargine (LANTUS) 100 unit/mL SOPN Inject 40 Units into the skin 2 (two) times daily.     JARDIANCE 25 MG TABS tablet Take 25 mg by mouth daily.     linaclotide (LINZESS) 72 MCG capsule Take 72 mcg by  mouth as needed.     methocarbamol (ROBAXIN) 500 MG tablet Take 500 mg by mouth 2 (two) times daily.     ondansetron (ZOFRAN) 4 MG tablet Take 4 mg by mouth every 6 (six) hours as needed for nausea or vomiting.     OZEMPIC, 0.25 OR 0.5 MG/DOSE, 2 MG/3ML SOPN Inject 0.5 mg into the skin once a week.     pantoprazole (PROTONIX) 40 MG tablet Take 1 tablet (40 mg total) by mouth daily.     polyethylene glycol powder (GLYCOLAX/MIRALAX) 17 GM/SCOOP powder Take one capful in 6 ounces of liquid twice daily until soft stool, then continue once daily. 527 g 5   predniSONE (DELTASONE) 20 MG tablet Take 40 mg by mouth daily.     propranolol ER (INDERAL LA) 120 MG 24 hr capsule Take 1 capsule (120 mg total) by mouth daily. 30 capsule 6   rosuvastatin (CRESTOR) 40 MG tablet Take 40 mg by mouth daily.     topiramate (TOPAMAX) 50 MG tablet Take 50 mg by mouth 2 (two) times daily.     VENTOLIN HFA 108 (90 Base) MCG/ACT inhaler SMARTSIG:2 Puff(s) By Mouth PRN     vitamin B-12 1000 MCG tablet Take 1 tablet (1,000 mcg total) by mouth daily.     No current facility-administered medications for this visit.    Allergies   Allergies as of 06/18/2023   (No Known Allergies)     Past Medical History   Past Medical History:  Diagnosis Date   Anemia 02/05/2012   Anxiety    Arthritis    Asthma    B12 deficiency    CKD (chronic kidney disease) stage 3, GFR 30-59 ml/min (HCC) 09/25/2021   COPD (chronic obstructive pulmonary disease) (HCC)    Depression    Diabetes (HCC)    Elevated LFTs    Essential hypertension    Fatty liver    Folate deficiency    GERD (gastroesophageal reflux disease)    Headache(784.0)    Heart failure (HCC)    PAD (peripheral artery disease) (HCC)    Status post bilateral AKA   PE (pulmonary embolism)    Xarelto   Pneumonia 2013   PVC (premature ventricular contraction)    Sepsis (HCC)    Type 2 diabetes mellitus (HCC)    Vitamin D deficiency     Past Surgical History    Past Surgical History:  Procedure Laterality Date   ABDOMINAL AORTOGRAM W/LOWER EXTREMITY N/A 09/05/2021   Procedure: ABDOMINAL AORTOGRAM W/LOWER EXTREMITY;  Surgeon: [name], MD;  Location: [facility];  Service: Cardiovascular;  Laterality: N/A;   AMPUTATION Right 09/24/2021   Procedure: RIGHT ABOVE KNEE AMPUTATION;  Surgeon: [name], MD;  Location: [facility] OR;  Service: Vascular;  Laterality: Right;   APPENDECTOMY  2013   CHOLECYSTECTOMY     DIAGNOSTIC LAPAROSCOPY     ESOPHAGEAL DILATION N/A 10/09/2014   Procedure: ESOPHAGEAL DILATION;  Surgeon: West Bali, MD;  Location: [facility];  Service: Endoscopy;  Laterality: N/A;  #15 and #16 savory  ESOPHAGOGASTRODUODENOSCOPY (EGD) WITH PROPOFOL N/A 10/09/2014   Dr. Darrick Penna: mild erosive gastritis, Savary dilation. Nausea and vomiting most likely due to uncontroleld blood sugars, reflux, and gastritis.     Past Family History   Family History  Problem Relation Age of Onset   Breast cancer Mother    Kidney disease Sister    Diabetes Sister    Breast cancer Sister    Diabetes Brother    Colon cancer Neg Hx    Liver disease Neg Hx     Past Social History   Social History   Socioeconomic History   Marital status: Single    Spouse name: Not on file   Number of children: Not on file   Years of education: 12   Highest education level: 12th grade  Occupational History   Not on file  Tobacco Use   Smoking status: Every Day    Current packs/day: 1.00    Average packs/day: 1 pack/day for 20.0 years (20.0 ttl pk-yrs)    Types: Cigarettes    Passive exposure: Current   Smokeless tobacco: Never  Vaping Use   Vaping status: Former  Substance and Sexual Activity   Alcohol use: No   Drug use: No   Sexual activity: Yes    Birth control/protection: Injection  Other Topics Concern   Not on file  Social History Narrative   Not on file   Social Determinants of Health   Financial Resource Strain: Low Risk   (11/03/2022)   Overall Financial Resource Strain (CARDIA)    Difficulty of Paying Living Expenses: Not hard at all  Food Insecurity: No Food Insecurity (12/28/2022)   Hunger Vital Sign    Worried About Running Out of Food in the Last Year: Never true    Ran Out of Food in the Last Year: Never true  Transportation Needs: No Transportation Needs (03/18/2023)   PRAPARE - Administrator, Civil Service (Medical): No    Lack of Transportation (Non-Medical): No  Physical Activity: Inactive (03/18/2023)   Exercise Vital Sign    Days of Exercise per Week: 0 days    Minutes of Exercise per Session: 0 min  Stress: Stress Concern Present (03/18/2022)   [employer] of Occupational Health - Occupational Stress Questionnaire    Feeling of Stress : Rather much  Social Connections: Unknown (03/18/2022)   Social Connection and Isolation Panel [NHANES]    Frequency of Communication with Friends and Family: More than three times a week    Frequency of Social Gatherings with Friends and Family: More than three times a week    Attends Religious Services: More than 4 times per year    Active Member of [employer] or Organizations: No    Attends Banker Meetings: Never    Marital Status: Not on file  Intimate Partner Violence: Not At Risk (03/18/2022)   Humiliation, Afraid, Rape, and Kick questionnaire    Fear of Current or Ex-Partner: No    Emotionally Abused: No    Physically Abused: No    Sexually Abused: No    Review of Systems   General: Negative for anorexia, weight loss, fever, chills, +fatigue, weakness. ENT: Negative for hoarseness, difficulty swallowing , nasal congestion. CV: Negative for chest pain, angina, palpitations, dyspnea on exertion, peripheral edema.  Respiratory: Negative for dyspnea at rest, dyspnea on exertion, cough, sputum, wheezing.  GI: See history of present illness. GU:  Negative for dysuria, hematuria, urinary incontinence, urinary frequency, nocturnal  urination.  Endo:  Negative for unusual weight change.     Physical Exam   BP (!) 157/66 (BP Location: Left Arm, Patient Position: Sitting, Cuff Size: Normal)   Pulse 69   Temp 98.3 F (36.8 C) (Oral)   SpO2 92%    General: Well-nourished, well-developed in no acute distress.  Eyes: No icterus. Mouth: Oropharyngeal mucosa moist and pink  Lungs: Clear to auscultation bilaterally.  Heart: Regular rate and rhythm, no murmurs rubs or gallops.  Abdomen: Bowel sounds are normal, nontender, nondistended, no hepatosplenomegaly or masses,  no abdominal bruits or hernia , no rebound or guarding.  Limited by performing exam in wheelchair Rectal: Not performed Extremities: Bilateral AKA  Neuro: Alert and oriented x 4   Skin: Warm and dry, no jaundice.   Psych: Alert and cooperative, normal mood and affect.  Labs   See hpi  Imaging Studies   ECHOCARDIOGRAM COMPLETE  Result Date: 05/20/2023    ECHOCARDIOGRAM REPORT   Patient Name:   [name] Date of Exam: 05/20/2023 Medical Rec #:  [id number]       Height:       66.0 in Accession #:    [id number]      Weight:       191.0 lb Date of Birth:  [birth year]       BSA:          1.961 m Patient Age:    52 years        BP:           118/68 mmHg Patient Gender: F               HR:           90 bpm. Exam Location:  [place] Procedure: 2D Echo Indications:    R00.2 Palpitations  History:        Patient has prior history of Echocardiogram examinations, most                 recent 07/16/2022. CHF, CAD, PAD, COPD and Chronic kidney                 disease, Arrythmias:PVC and PAC; Risk Factors:Current Smoker,                 Hypertension, Diabetes and Dyslipidemia.  Sonographer:    Dominica Severin RCS, RVS Referring Phys: [id number] Maurice Small  Sonographer Comments: Global longitudinal strain was attempted. IMPRESSIONS  1. Left ventricular ejection fraction, by estimation, is 45 to 50%. The left ventricle has mildly decreased function. The left ventricle has no  regional wall motion abnormalities. Left ventricular diastolic parameters are indeterminate. Elevated left ventricular end-diastolic pressure.  2. Right ventricular systolic function is normal. The right ventricular size is normal. Tricuspid regurgitation signal is inadequate for assessing PA pressure.  3. A small pericardial effusion is present. The pericardial effusion is anterior to the right ventricle and localized near the right atrium.  4. The mitral valve is normal in structure. Mild mitral valve regurgitation. No evidence of mitral stenosis.  5. The aortic valve has an indeterminant number of cusps. Aortic valve regurgitation is mild. No aortic stenosis is present.  6. The inferior vena cava is normal in size with greater than 50% respiratory variability, suggesting right atrial pressure of 3 mmHg. Comparison(s): Changes from prior study are noted. Stable LVEF, 45-50% now compared to 50-55% previously. FINDINGS  Left Ventricle: Left ventricular ejection fraction, by estimation, is 45 to 50%. The left ventricle has mildly decreased  function. The left ventricle has no regional wall motion abnormalities. The left ventricular internal cavity size was normal in size. There is no left ventricular hypertrophy. Left ventricular diastolic parameters are indeterminate. Elevated left ventricular end-diastolic pressure. Right Ventricle: The right ventricular size is normal. No increase in right ventricular wall thickness. Right ventricular systolic function is normal. Tricuspid regurgitation signal is inadequate for assessing PA pressure. Left Atrium: Left atrial size was normal in size. Right Atrium: Right atrial size was normal in size. Pericardium: A small pericardial effusion is present. The pericardial effusion is anterior to the right ventricle and localized near the right atrium. Mitral Valve: The mitral valve is normal in structure. Mild mitral valve regurgitation. No evidence of mitral valve stenosis. MV peak  gradient, 7.6 mmHg. The mean mitral valve gradient is 3.0 mmHg. Tricuspid Valve: The tricuspid valve is grossly normal. Tricuspid valve regurgitation is not demonstrated. No evidence of tricuspid stenosis. Aortic Valve: The aortic valve has an indeterminant number of cusps. Aortic valve regurgitation is mild. Aortic regurgitation PHT measures 388 msec. No aortic stenosis is present. Aortic valve mean gradient measures 6.0 mmHg. Aortic valve peak gradient measures 16.8 mmHg. Aortic valve area, by VTI measures 1.37 cm. Pulmonic Valve: The pulmonic valve was not well visualized. Pulmonic valve regurgitation is trivial. No evidence of pulmonic stenosis. Aorta: The aortic root and ascending aorta are structurally normal, with no evidence of dilitation. Venous: The inferior vena cava is normal in size with greater than 50% respiratory variability, suggesting right atrial pressure of 3 mmHg. IAS/Shunts: No atrial level shunt detected by color flow Doppler.  LEFT VENTRICLE PLAX 2D LVIDd:         5.30 cm      Diastology LVIDs:         4.10 cm      LV e' medial:    5.33 cm/s LV PW:         1.00 cm      LV E/e' medial:  22.1 LV IVS:        1.00 cm      LV e' lateral:   4.68 cm/s LVOT diam:     2.00 cm      LV E/e' lateral: 25.2 LV SV:         46 LV SV Index:   24 LVOT Area:     3.14 cm  LV Volumes (MOD) LV vol d, MOD A2C: 122.0 ml LV vol d, MOD A4C: 55.4 ml LV vol s, MOD A2C: 72.3 ml LV vol s, MOD A4C: 32.7 ml LV SV MOD A2C:     49.7 ml LV SV MOD A4C:     55.4 ml LV SV MOD BP:      34.0 ml RIGHT VENTRICLE RV Basal diam:  2.50 cm RV Mid diam:    2.20 cm RV S prime:     15.70 cm/s TAPSE (M-mode): 2.7 cm LEFT ATRIUM             Index        RIGHT ATRIUM           Index LA diam:        4.00 cm 2.04 cm/m   RA Area:     10.70 cm LA Vol (A2C):   58.6 ml 29.88 ml/m  RA Volume:   20.60 ml  10.50 ml/m LA Vol (A4C):   50.5 ml 25.75 ml/m LA Biplane Vol: 54.8 ml 27.94 ml/m  AORTIC VALVE  PULMONIC VALVE AV Area  (Vmax):    1.35 cm      PV Vmax:       0.86 m/s AV Area (Vmean):   1.50 cm      PV Peak grad:  3.0 mmHg AV Area (VTI):     1.37 cm AV Vmax:           205.00 cm/s AV Vmean:          113.000 cm/s AV VTI:            0.337 m AV Peak Grad:      16.8 mmHg AV Mean Grad:      6.0 mmHg LVOT Vmax:         88.30 cm/s LVOT Vmean:        54.100 cm/s LVOT VTI:          0.147 m LVOT/AV VTI ratio: 0.44 AI PHT:            388 msec  AORTA Ao Root diam: 2.60 cm Ao Asc diam:  2.90 cm MITRAL VALVE MV Area (PHT): 3.27 cm     SHUNTS MV Area VTI:   1.19 cm     Systemic VTI:  0.15 m MV Peak grad:  7.6 mmHg     Systemic Diam: 2.00 cm MV Mean grad:  3.0 mmHg MV Vmax:       1.38 m/s MV Vmean:      80.5 cm/s MV Decel Time: 232 msec MR Peak grad: 144.5 mmHg MR Mean grad: 100.0 mmHg MR Vmax:      601.00 cm/s MR Vmean:     483.0 cm/s MV E velocity: 118.00 cm/s MV A velocity: 120.00 cm/s MV E/A ratio:  0.98 Vishnu Priya Mallipeddi Electronically signed by Winfield Rast Mallipeddi Signature Date/Time: 05/20/2023/12:36:52 PM    Final     Assessment/Plan:   RUQ pain/Abnormal duodenum on CT: -chronic RUQ pain with prior history of hepatic abscesses. Reports pain present since remote appendectomy. Recent CT with question of abnormal duodenum vs peristalsis or underdistention. Consider EGD for further evaluation. I wonder if her pain is more musculoskeletal from transferring herself, pushing wheelchair.   GERD/Esophageal dysphagia: -continue PPI daily -EGD/ED  Constipation: -use miralax regularly, start with 2 capfuls daily until soft productive stool, then continue 1 capful daily as needed.  -May use Linzess up to once daily if needed as well  Colon cancer screening: -colonoscopy   EGD/ED/Colonoscopy with Dr. Jena Gauss. ASA 3.  I have discussed the risks, alternatives, benefits with regards to but not limited to the risk of reaction to medication, bleeding, infection, perforation and the patient is agreeable to proceed. Written consent  to be obtained.       Leanna Battles. Melvyn Neth, MHS, PA-C [name]

## 2023-06-21 ENCOUNTER — Ambulatory Visit: Payer: 59 | Attending: Cardiology | Admitting: Cardiology

## 2023-06-21 ENCOUNTER — Encounter: Payer: Self-pay | Admitting: Cardiology

## 2023-06-21 VITALS — BP 138/80 | HR 98

## 2023-06-21 DIAGNOSIS — I493 Ventricular premature depolarization: Secondary | ICD-10-CM | POA: Diagnosis not present

## 2023-06-21 DIAGNOSIS — I5022 Chronic systolic (congestive) heart failure: Secondary | ICD-10-CM | POA: Diagnosis not present

## 2023-06-21 DIAGNOSIS — I10 Essential (primary) hypertension: Secondary | ICD-10-CM

## 2023-06-21 MED ORDER — PROPRANOLOL HCL ER 160 MG PO CP24: 160.0000 mg | ORAL_CAPSULE | Freq: Every day | ORAL | 1 refills | Status: DC

## 2023-06-21 NOTE — Progress Notes (Signed)
Cardiology Office Note  Date: 06/21/2023   ID: [name], DOB June 12, 1971, MRN [id number]  History of Present Illness: [name] is a 52 y.o. female last seen in October by Dr. Nelly Laurence, I reviewed the note.  She is here today for a follow-up visit.  Reports recent URI symptoms, cough and chest congestion, headaches.  States that she took a course of antibiotics per PCP.  Has been having more palpitations as well.  No dizziness or syncope.  I reviewed her current medications.  Cardiovascular regimen includes aspirin, Norvasc, Entresto, Jardiance, Ozempic, propranolol, and Crestor.  Beta-blocker has been switched from atenolol to metoprolol and most recently to propranolol.  Cardiac monitor from April of this year showed approximately 24% PVC burden.  Fortunately, echocardiogram in October showed relative improvement in LVEF to the range of 45 to 50%.  Physical Exam: VS:  BP (!) 148/80   Pulse 98   SpO2 92% , BMI There is no height or weight on file to calculate BMI.  Wt Readings from Last 3 Encounters:  04/30/23 191 lb (86.6 kg)  12/01/22 191 lb (86.6 kg)  10/22/22 191 lb (86.6 kg)    General: Patient in wheelchair today. HEENT: Conjunctiva and lids normal. Neck: Supple, no elevated JVP or carotid bruits. Lungs: Scattered rhonchi and chest congestion, no wheezing.  Nonlabored breathing at rest. Cardiac: Regular rate and rhythm with frequent ectopy, no S3 or significant systolic murmur, no pericardial rub. Extremities: Status post bilateral AKA.  ECG:  An ECG dated 04/30/2023 was personally reviewed today and demonstrated:  Sinus rhythm with ventricular bigeminy.  Labwork:  December 2023: Hemoglobin 12.7, platelets 217, BUN 21, creatinine 1.35, potassium 4.8, AST 24, ALT 32, cholesterol 103, glycerides 179, HDL 26, LDL 47, hemoglobin A1c 10.2%, TSH 2.91  Other Studies Reviewed Today:  Echocardiogram 05/20/2023:  1. Left ventricular ejection fraction, by estimation,  is 45 to 50%. The  left ventricle has mildly decreased function. The left ventricle has no  regional wall motion abnormalities. Left ventricular diastolic parameters  are indeterminate. Elevated left  ventricular end-diastolic pressure.   2. Right ventricular systolic function is normal. The right ventricular  size is normal. Tricuspid regurgitation signal is inadequate for assessing  PA pressure.   3. A small pericardial effusion is present. The pericardial effusion is  anterior to the right ventricle and localized near the right atrium.   4. The mitral valve is normal in structure. Mild mitral valve  regurgitation. No evidence of mitral stenosis.   5. The aortic valve has an indeterminant number of cusps. Aortic valve  regurgitation is mild. No aortic stenosis is present.   6. The inferior vena cava is normal in size with greater than 50%  respiratory variability, suggesting right atrial pressure of 3 mmHg.   Assessment and Plan:  1.  HFmrEF, LVEF 45 to 50% by follow-up echocardiogram in October of this year.  Current regimen includes Entresto, Jardiance, Ozempic, and propranolol ER.  May ultimately consider adding Aldactone.   2.  Frequent PVCs.  Following with Dr. Nelly Laurence.  She was switched from atenolol to metoprolol and then ultimately propranolol.  Plan to increase propranolol ER to 160 mg daily.  Keep follow-up visit with the EP for early December.  If not effective, could consider acebutolol.   3.  Severe PAD status post bilateral AKA.  She is on aspirin and Crestor.   4.  Ongoing tobacco abuse.  5.  Mixed hyperlipidemia, LDL 47 in December 2023.  Disposition:  Follow up  6 months.  Signed, Jonelle Sidle, M.D., F.A.C.C.  [facility]

## 2023-06-21 NOTE — Patient Instructions (Addendum)
Medication Instructions:  Your physician has recommended you make the following change in your medication:  Increase inderal LA to 160 mg daily Continue all other medications as prescribed  Labwork: none  Testing/Procedures: none  Follow-Up: Your physician recommends that you schedule a follow-up appointment in: 6 months  Any Other Special Instructions Will Be Listed Below (If Applicable).  If you need a refill on your cardiac medications before your next appointment, please call your pharmacy.

## 2023-06-30 NOTE — Telephone Encounter (Signed)
Clearance scanned under media tab

## 2023-07-01 NOTE — Telephone Encounter (Addendum)
[name], Ok to schedule. I prefer patient be assigned to Dr. Jena Gauss given her significant PVD and need to stay on Plavix. She had never been seen by Dr. Marletta Lor.   Let's plan on EGD/ED/TCS with Dr. Jena Gauss. No need to hold Plavix.  Other orders as previously given.

## 2023-07-02 ENCOUNTER — Ambulatory Visit: Payer: 59 | Attending: Cardiovascular Disease | Admitting: Cardiovascular Disease

## 2023-07-02 ENCOUNTER — Encounter: Payer: Self-pay | Admitting: Cardiovascular Disease

## 2023-07-02 VITALS — BP 138/60 | HR 100

## 2023-07-02 DIAGNOSIS — G4733 Obstructive sleep apnea (adult) (pediatric): Secondary | ICD-10-CM | POA: Diagnosis not present

## 2023-07-02 DIAGNOSIS — R002 Palpitations: Secondary | ICD-10-CM

## 2023-07-02 NOTE — Telephone Encounter (Signed)
LMTRC

## 2023-07-02 NOTE — Progress Notes (Signed)
Electrophysiology Office Note:    Date:  07/02/2023   ID:  [name], DOB June 05, 1971, MRN [id number]  PCP:  [name]   [facility] Providers Cardiologist:  Nona Dell, MD Electrophysiologist:  Maurice Small, MD     Referring MD: Royann Shivers, *   History of Present Illness:    [name] is a 52 y.o. female with a medical history significant for heart failure with slightly decreased ejection fraction, frequent PVCs, hypertension, PAD sepsis bilateral above-the-knee amputations, CKD 3, referred for management of PVCs.     She first came to the attention of cardiology in July 2023 when she saw Dr. Diona Browner for frequent palpitations and PVCs.  Her ejection fraction at that time was reduced at 40%, and a Zio patch showed a PVC burden of 5.4%.  After starting GDMT, her EF normalized.  In follow-up, the patient continued to notice palpitations, fatigue.  Repeat monitor showed a PVC burden of 24%      She does not seem to have much benefit from atenolol, metoprolol, or propranolol.  Magnesium tolerate did not make much of a difference.  Today she complains of a recent respiratory infection.  She continues to have cough and congestion during this visit.  She has been treated with steroids and antibiotics.  EKGs/Labs/Other Studies Reviewed Today:    Echocardiogram:  07/16/2022 EF 50 to 55%, mild mitral regurgitation     TTE 05/20/2023     EF 45 to 50%.  Elevated LVEDP.  Small pericardial effusion anterior to the right ventricle localized to the right atrium.  Monitors:  Zio 10d 08/2022 Sinus rhythm heart rate 70-125 beats minute, average 84 23.7% PVC burden, no symptoms reported no other arrhythmia detected   Stress testing:  09/15/2022 No evidence of ischemia. Frequent PVCs noted  Advanced imaging:   Cardiac catherization   EKG:   EKG Interpretation Date/Time:  Friday July 02 2023 13:46:31 EST Ventricular  Rate:  101 PR Interval:  170 QRS Duration:  86 QT Interval:  310 QTC Calculation: 401 R Axis:   105  Text Interpretation: Sinus tachycardia with frequent Premature ventricular complexes and Fusion complexes Rightward axis Nonspecific T wave abnormality When compared with ECG of 30-Apr-2023 13:36, Fusion complexes are now Present Incomplete right bundle branch block is no longer Present Confirmed by York Pellant [id number]) on 07/02/2023 2:29:41 PM  Reviewed today.  PVCs are + II, - III, isoelectric aVF. - V1, + V2, -V3; +I   Physical Exam:    VS:  BP 138/60 (BP Location: Left Arm, Cuff Size: Normal)   Pulse 100     Wt Readings from Last 3 Encounters:  04/30/23 191 lb (86.6 kg)  12/01/22 191 lb (86.6 kg)  10/22/22 191 lb (86.6 kg)     GEN: Well nourished, well developed in no acute distress CARDIAC: RRR with occasional abnormal beat, no murmurs, rubs, gallops RESPIRATORY:  Normal work of breathing MUSCULOSKELETAL: Status post bilateral AKA    ASSESSMENT & PLAN:    Frequent PVCs Symptomatic with mild palpitations She also has fatigue, but it is uncertain whether these correlate with the PVCs Continue propranolol 160 Dc magnesium taurate 400mg  Refer for sleep study She is not a great candidate for ablation  CHF with recovered ejection fraction EF has improved despite PVCs --I do not think that she had a PVC induced cardiomyopathy Although the propranolol is not a CHF beta-blocker, I think will be overall benefit if it is  more effective in suppressing her PVCs  Tobacco abuse Smoking cessation encouraged  Vascular disease --peripheral, carotid, coronary On aspirin 81, Lipitor 80, clopidogrel 75   Signed, Maurice Small, MD  07/02/2023 2:30 PM    [facility]

## 2023-07-02 NOTE — Patient Instructions (Addendum)
Medication Instructions:   Continue all current medications.   Labwork:  none  Testing/Procedures:  none  Follow-Up:  3 months   Any Other Special Instructions Will Be Listed Below (If Applicable).  You have been referred to:  Pulmonary    If you need a refill on your cardiac medications before your next appointment, please call your pharmacy.

## 2023-07-12 NOTE — Telephone Encounter (Signed)
LMTRC

## 2023-07-26 ENCOUNTER — Encounter: Payer: Self-pay | Admitting: *Deleted

## 2023-07-26 NOTE — Telephone Encounter (Signed)
Mailed letter °

## 2023-08-18 ENCOUNTER — Other Ambulatory Visit (HOSPITAL_COMMUNITY): Payer: Self-pay | Admitting: Nephrology

## 2023-08-18 DIAGNOSIS — I10 Essential (primary) hypertension: Secondary | ICD-10-CM

## 2023-08-18 DIAGNOSIS — E1122 Type 2 diabetes mellitus with diabetic chronic kidney disease: Secondary | ICD-10-CM

## 2023-08-18 DIAGNOSIS — D631 Anemia in chronic kidney disease: Secondary | ICD-10-CM

## 2023-08-20 ENCOUNTER — Encounter (HOSPITAL_COMMUNITY): Payer: Self-pay

## 2023-08-21 ENCOUNTER — Inpatient Hospital Stay (HOSPITAL_COMMUNITY)
Admission: AD | Admit: 2023-08-21 | Discharge: 2023-09-03 | DRG: 291 | Disposition: A | Discharge status: Home Health | Payer: 59 | Source: Other Acute Inpatient Hospital | Attending: Internal Medicine | Admitting: Internal Medicine

## 2023-08-21 DIAGNOSIS — E669 Obesity, unspecified: Secondary | ICD-10-CM | POA: Diagnosis present

## 2023-08-21 DIAGNOSIS — G8929 Other chronic pain: Secondary | ICD-10-CM | POA: Diagnosis present

## 2023-08-21 DIAGNOSIS — I130 Hypertensive heart and chronic kidney disease with heart failure and stage 1 through stage 4 chronic kidney disease, or unspecified chronic kidney disease: Principal | ICD-10-CM | POA: Diagnosis present

## 2023-08-21 DIAGNOSIS — I2510 Atherosclerotic heart disease of native coronary artery without angina pectoris: Secondary | ICD-10-CM | POA: Diagnosis present

## 2023-08-21 DIAGNOSIS — E871 Hypo-osmolality and hyponatremia: Secondary | ICD-10-CM | POA: Diagnosis present

## 2023-08-21 DIAGNOSIS — Z7902 Long term (current) use of antithrombotics/antiplatelets: Secondary | ICD-10-CM

## 2023-08-21 DIAGNOSIS — N183 Chronic kidney disease, stage 3 unspecified: Secondary | ICD-10-CM | POA: Diagnosis not present

## 2023-08-21 DIAGNOSIS — Z841 Family history of disorders of kidney and ureter: Secondary | ICD-10-CM

## 2023-08-21 DIAGNOSIS — E1151 Type 2 diabetes mellitus with diabetic peripheral angiopathy without gangrene: Secondary | ICD-10-CM | POA: Diagnosis present

## 2023-08-21 DIAGNOSIS — I5021 Acute systolic (congestive) heart failure: Secondary | ICD-10-CM | POA: Diagnosis not present

## 2023-08-21 DIAGNOSIS — Z89511 Acquired absence of right leg below knee: Secondary | ICD-10-CM

## 2023-08-21 DIAGNOSIS — F32A Depression, unspecified: Secondary | ICD-10-CM | POA: Diagnosis present

## 2023-08-21 DIAGNOSIS — E1169 Type 2 diabetes mellitus with other specified complication: Secondary | ICD-10-CM | POA: Diagnosis present

## 2023-08-21 DIAGNOSIS — Z6829 Body mass index (BMI) 29.0-29.9, adult: Secondary | ICD-10-CM

## 2023-08-21 DIAGNOSIS — Z89611 Acquired absence of right leg above knee: Secondary | ICD-10-CM

## 2023-08-21 DIAGNOSIS — E1165 Type 2 diabetes mellitus with hyperglycemia: Secondary | ICD-10-CM | POA: Diagnosis present

## 2023-08-21 DIAGNOSIS — E874 Mixed disorder of acid-base balance: Secondary | ICD-10-CM | POA: Diagnosis present

## 2023-08-21 DIAGNOSIS — E1140 Type 2 diabetes mellitus with diabetic neuropathy, unspecified: Secondary | ICD-10-CM | POA: Diagnosis present

## 2023-08-21 DIAGNOSIS — K219 Gastro-esophageal reflux disease without esophagitis: Secondary | ICD-10-CM | POA: Diagnosis present

## 2023-08-21 DIAGNOSIS — R109 Unspecified abdominal pain: Secondary | ICD-10-CM | POA: Diagnosis not present

## 2023-08-21 DIAGNOSIS — Z79899 Other long term (current) drug therapy: Secondary | ICD-10-CM

## 2023-08-21 DIAGNOSIS — E1122 Type 2 diabetes mellitus with diabetic chronic kidney disease: Secondary | ICD-10-CM | POA: Diagnosis present

## 2023-08-21 DIAGNOSIS — E11649 Type 2 diabetes mellitus with hypoglycemia without coma: Secondary | ICD-10-CM | POA: Diagnosis present

## 2023-08-21 DIAGNOSIS — G9341 Metabolic encephalopathy: Secondary | ICD-10-CM | POA: Diagnosis present

## 2023-08-21 DIAGNOSIS — I10 Essential (primary) hypertension: Secondary | ICD-10-CM | POA: Diagnosis not present

## 2023-08-21 DIAGNOSIS — Z794 Long term (current) use of insulin: Secondary | ICD-10-CM

## 2023-08-21 DIAGNOSIS — Z89612 Acquired absence of left leg above knee: Secondary | ICD-10-CM

## 2023-08-21 DIAGNOSIS — J9601 Acute respiratory failure with hypoxia: Secondary | ICD-10-CM | POA: Diagnosis present

## 2023-08-21 DIAGNOSIS — N1831 Chronic kidney disease, stage 3a: Secondary | ICD-10-CM | POA: Diagnosis not present

## 2023-08-21 DIAGNOSIS — Z89512 Acquired absence of left leg below knee: Secondary | ICD-10-CM

## 2023-08-21 DIAGNOSIS — N1832 Chronic kidney disease, stage 3b: Secondary | ICD-10-CM | POA: Diagnosis present

## 2023-08-21 DIAGNOSIS — E119 Type 2 diabetes mellitus without complications: Secondary | ICD-10-CM

## 2023-08-21 DIAGNOSIS — Z716 Tobacco abuse counseling: Secondary | ICD-10-CM

## 2023-08-21 DIAGNOSIS — I472 Ventricular tachycardia, unspecified: Secondary | ICD-10-CM | POA: Diagnosis present

## 2023-08-21 DIAGNOSIS — I5023 Acute on chronic systolic (congestive) heart failure: Principal | ICD-10-CM | POA: Insufficient documentation

## 2023-08-21 DIAGNOSIS — R008 Other abnormalities of heart beat: Secondary | ICD-10-CM | POA: Diagnosis present

## 2023-08-21 DIAGNOSIS — J9602 Acute respiratory failure with hypercapnia: Secondary | ICD-10-CM | POA: Diagnosis present

## 2023-08-21 DIAGNOSIS — Z803 Family history of malignant neoplasm of breast: Secondary | ICD-10-CM

## 2023-08-21 DIAGNOSIS — G4733 Obstructive sleep apnea (adult) (pediatric): Secondary | ICD-10-CM | POA: Diagnosis present

## 2023-08-21 DIAGNOSIS — Z833 Family history of diabetes mellitus: Secondary | ICD-10-CM

## 2023-08-21 DIAGNOSIS — I5043 Acute on chronic combined systolic (congestive) and diastolic (congestive) heart failure: Secondary | ICD-10-CM | POA: Diagnosis present

## 2023-08-21 DIAGNOSIS — I5033 Acute on chronic diastolic (congestive) heart failure: Principal | ICD-10-CM | POA: Insufficient documentation

## 2023-08-21 DIAGNOSIS — I509 Heart failure, unspecified: Principal | ICD-10-CM

## 2023-08-21 DIAGNOSIS — D631 Anemia in chronic kidney disease: Secondary | ICD-10-CM | POA: Diagnosis present

## 2023-08-21 DIAGNOSIS — Z7984 Long term (current) use of oral hypoglycemic drugs: Secondary | ICD-10-CM

## 2023-08-21 DIAGNOSIS — F17210 Nicotine dependence, cigarettes, uncomplicated: Secondary | ICD-10-CM | POA: Diagnosis present

## 2023-08-21 DIAGNOSIS — E785 Hyperlipidemia, unspecified: Secondary | ICD-10-CM | POA: Diagnosis present

## 2023-08-21 DIAGNOSIS — Z86711 Personal history of pulmonary embolism: Secondary | ICD-10-CM

## 2023-08-21 DIAGNOSIS — E1159 Type 2 diabetes mellitus with other circulatory complications: Secondary | ICD-10-CM

## 2023-08-21 DIAGNOSIS — J438 Other emphysema: Secondary | ICD-10-CM | POA: Diagnosis not present

## 2023-08-21 DIAGNOSIS — J410 Simple chronic bronchitis: Secondary | ICD-10-CM | POA: Diagnosis not present

## 2023-08-21 DIAGNOSIS — J449 Chronic obstructive pulmonary disease, unspecified: Secondary | ICD-10-CM

## 2023-08-21 DIAGNOSIS — F419 Anxiety disorder, unspecified: Secondary | ICD-10-CM | POA: Diagnosis present

## 2023-08-21 DIAGNOSIS — I255 Ischemic cardiomyopathy: Secondary | ICD-10-CM | POA: Diagnosis present

## 2023-08-21 DIAGNOSIS — I080 Rheumatic disorders of both mitral and aortic valves: Secondary | ICD-10-CM | POA: Diagnosis present

## 2023-08-21 DIAGNOSIS — J4489 Other specified chronic obstructive pulmonary disease: Secondary | ICD-10-CM | POA: Diagnosis present

## 2023-08-21 DIAGNOSIS — I493 Ventricular premature depolarization: Secondary | ICD-10-CM | POA: Diagnosis present

## 2023-08-21 DIAGNOSIS — Z7985 Long-term (current) use of injectable non-insulin antidiabetic drugs: Secondary | ICD-10-CM

## 2023-08-21 DIAGNOSIS — Z7982 Long term (current) use of aspirin: Secondary | ICD-10-CM

## 2023-08-21 LAB — BASIC METABOLIC PANEL
Anion gap: 10 (ref 5–15)
BUN: 18 mg/dL (ref 6–20)
CO2: 22 mmol/L (ref 22–32)
Calcium: 8.3 mg/dL — ABNORMAL LOW (ref 8.9–10.3)
Chloride: 98 mmol/L (ref 98–111)
Creatinine, Ser: 1.79 mg/dL — ABNORMAL HIGH (ref 0.44–1.00)
GFR, Estimated: 33 mL/min — ABNORMAL LOW (ref >60)
Glucose, Bld: 109 mg/dL — ABNORMAL HIGH (ref 70–99)
Potassium: 4.9 mmol/L (ref 3.5–5.1)
Sodium: 130 mmol/L — ABNORMAL LOW (ref 135–145)

## 2023-08-21 LAB — CBC WITH DIFFERENTIAL/PLATELET
Abs Immature Granulocytes: 0.03 10*3/uL (ref 0.00–0.07)
Basophils Absolute: 0.1 10*3/uL (ref 0.0–0.1)
Basophils Relative: 1 %
Eosinophils Absolute: 0.2 10*3/uL (ref 0.0–0.5)
Eosinophils Relative: 3 %
HCT: 34.3 % — ABNORMAL LOW (ref 36.0–46.0)
Hemoglobin: 10.8 g/dL — ABNORMAL LOW (ref 12.0–15.0)
Immature Granulocytes: 0 %
Lymphocytes Relative: 29 %
Lymphs Abs: 2.1 10*3/uL (ref 0.7–4.0)
MCH: 29.5 pg (ref 26.0–34.0)
MCHC: 31.5 g/dL (ref 30.0–36.0)
MCV: 93.7 fL (ref 80.0–100.0)
Monocytes Absolute: 0.6 10*3/uL (ref 0.1–1.0)
Monocytes Relative: 9 %
Neutro Abs: 4.1 10*3/uL (ref 1.7–7.7)
Neutrophils Relative %: 58 %
Platelets: 213 10*3/uL (ref 150–400)
RBC: 3.66 MIL/uL — ABNORMAL LOW (ref 3.87–5.11)
RDW: 16.9 % — ABNORMAL HIGH (ref 11.5–15.5)
WBC: 7.1 10*3/uL (ref 4.0–10.5)
nRBC: 0 % (ref 0.0–0.2)

## 2023-08-21 LAB — BRAIN NATRIURETIC PEPTIDE: B Natriuretic Peptide: >4500 pg/mL — ABNORMAL HIGH (ref 0.0–100.0)

## 2023-08-21 LAB — HEMOGLOBIN A1C
Hgb A1c MFr Bld: 7.6 % — ABNORMAL HIGH (ref 4.8–5.6)
Mean Plasma Glucose: 171.42 mg/dL

## 2023-08-21 LAB — MAGNESIUM: Magnesium: 1.6 mg/dL — ABNORMAL LOW (ref 1.7–2.4)

## 2023-08-21 MED ORDER — ROSUVASTATIN CALCIUM 20 MG PO TABS
40.0000 mg | ORAL_TABLET | Freq: Every day | ORAL | Status: DC
  Administered 2023-08-23 – 2023-09-03 (×12): 40 mg via ORAL
  Filled 2023-08-21 (×12): qty 2

## 2023-08-21 MED ORDER — INSULIN GLARGINE-YFGN 100 UNIT/ML ~~LOC~~ SOLN
36.0000 [IU] | Freq: Two times a day (BID) | SUBCUTANEOUS | Status: DC
  Administered 2023-08-22: 36 [IU] via SUBCUTANEOUS
  Filled 2023-08-21 (×2): qty 0.36

## 2023-08-21 MED ORDER — SODIUM CHLORIDE 0.9% FLUSH
3.0000 mL | Freq: Two times a day (BID) | INTRAVENOUS | Status: DC
  Administered 2023-08-22 – 2023-09-03 (×25): 3 mL via INTRAVENOUS

## 2023-08-21 MED ORDER — PANTOPRAZOLE SODIUM 40 MG PO TBEC
40.0000 mg | DELAYED_RELEASE_TABLET | Freq: Every day | ORAL | Status: DC
  Administered 2023-08-23 – 2023-09-03 (×12): 40 mg via ORAL
  Filled 2023-08-21 (×13): qty 1

## 2023-08-21 MED ORDER — HEPARIN SODIUM (PORCINE) 5000 UNIT/ML IJ SOLN
5000.0000 [IU] | Freq: Three times a day (TID) | INTRAMUSCULAR | Status: DC
  Administered 2023-08-22 – 2023-09-03 (×37): 5000 [IU] via SUBCUTANEOUS
  Filled 2023-08-21 (×38): qty 1

## 2023-08-21 MED ORDER — SODIUM CHLORIDE 0.9 % IV SOLN
250.0000 mL | INTRAVENOUS | Status: AC | PRN
  Administered 2023-08-22: 250 mL via INTRAVENOUS

## 2023-08-21 MED ORDER — AMLODIPINE BESYLATE 2.5 MG PO TABS: 2.5000 mg | ORAL_TABLET | Freq: Every day | ORAL | Status: DC

## 2023-08-21 MED ORDER — ACETAMINOPHEN 325 MG PO TABS
650.0000 mg | ORAL_TABLET | ORAL | Status: DC | PRN
  Administered 2023-08-23: 650 mg via ORAL
  Filled 2023-08-21 (×2): qty 2

## 2023-08-21 MED ORDER — FUROSEMIDE 10 MG/ML IJ SOLN
40.0000 mg | Freq: Every day | INTRAMUSCULAR | Status: DC
  Administered 2023-08-22: 40 mg via INTRAVENOUS
  Filled 2023-08-21: qty 4

## 2023-08-21 MED ORDER — ALBUTEROL SULFATE (2.5 MG/3ML) 0.083% IN NEBU: 3.0000 mL | INHALATION_SOLUTION | Freq: Four times a day (QID) | RESPIRATORY_TRACT | Status: DC | PRN

## 2023-08-21 MED ORDER — GABAPENTIN 400 MG PO CAPS: 400.0000 mg | ORAL_CAPSULE | Freq: Four times a day (QID) | ORAL | Status: DC

## 2023-08-21 MED ORDER — DULOXETINE HCL 30 MG PO CPEP: 30.0000 mg | ORAL_CAPSULE | Freq: Every day | ORAL | Status: DC

## 2023-08-21 MED ORDER — CLOPIDOGREL BISULFATE 75 MG PO TABS
75.0000 mg | ORAL_TABLET | Freq: Every day | ORAL | Status: DC
  Administered 2023-08-23 – 2023-09-03 (×12): 75 mg via ORAL
  Filled 2023-08-21 (×12): qty 1

## 2023-08-21 MED ORDER — SACUBITRIL-VALSARTAN 24-26 MG PO TABS
1.0000 | ORAL_TABLET | Freq: Two times a day (BID) | ORAL | Status: DC
  Administered 2023-08-22: 1 via ORAL
  Filled 2023-08-21: qty 1

## 2023-08-21 MED ORDER — PROPRANOLOL HCL ER 60 MG PO CP24
120.0000 mg | ORAL_CAPSULE | Freq: Every day | ORAL | Status: DC
  Filled 2023-08-21: qty 1
  Filled 2023-08-21: qty 2

## 2023-08-21 MED ORDER — ONDANSETRON HCL 4 MG/2ML IJ SOLN: 4.0000 mg | Freq: Four times a day (QID) | INTRAMUSCULAR | Status: DC | PRN

## 2023-08-21 MED ORDER — POLYETHYLENE GLYCOL 3350 17 G PO PACK
1.0000 | PACK | Freq: Every day | ORAL | Status: DC | PRN
  Administered 2023-08-25: 17 g via ORAL
  Filled 2023-08-21: qty 1

## 2023-08-21 MED ORDER — MAGNESIUM SULFATE 2 GM/50ML IV SOLN
2.0000 g | Freq: Once | INTRAVENOUS | Status: AC
  Administered 2023-08-22: 2 g via INTRAVENOUS
  Filled 2023-08-21: qty 50

## 2023-08-21 MED ORDER — TOPIRAMATE 25 MG PO TABS
50.0000 mg | ORAL_TABLET | Freq: Two times a day (BID) | ORAL | Status: DC
  Filled 2023-08-21 (×2): qty 2

## 2023-08-21 MED ORDER — DOCUSATE SODIUM 100 MG PO CAPS
100.0000 mg | ORAL_CAPSULE | Freq: Two times a day (BID) | ORAL | Status: DC | PRN
  Administered 2023-08-24 – 2023-08-25 (×2): 100 mg via ORAL
  Filled 2023-08-21 (×2): qty 1

## 2023-08-21 MED ORDER — ASPIRIN 81 MG PO TBEC
81.0000 mg | DELAYED_RELEASE_TABLET | Freq: Every day | ORAL | Status: DC
  Administered 2023-08-23 – 2023-09-03 (×12): 81 mg via ORAL
  Filled 2023-08-21 (×12): qty 1

## 2023-08-21 MED ORDER — SODIUM CHLORIDE 0.9% FLUSH: 3.0000 mL | INTRAVENOUS | Status: DC | PRN

## 2023-08-21 MED ORDER — ONDANSETRON HCL 4 MG PO TABS: 4.0000 mg | ORAL_TABLET | Freq: Four times a day (QID) | ORAL | Status: DC | PRN

## 2023-08-21 MED ORDER — INSULIN ASPART 100 UNIT/ML IJ SOLN: 0.0000 [IU] | Freq: Three times a day (TID) | INTRAMUSCULAR | Status: DC

## 2023-08-21 MED ORDER — VITAMIN B-12 1000 MCG PO TABS
1000.0000 ug | ORAL_TABLET | Freq: Every day | ORAL | Status: DC
  Administered 2023-08-23 – 2023-09-03 (×12): 1000 ug via ORAL
  Filled 2023-08-21 (×12): qty 1

## 2023-08-21 NOTE — H&P (Addendum)
History and Physical    Patient: [name] ZOX:[id number] DOB: Jun 02, 1971 DOA: 08/21/2023 DOS: the patient was seen and examined on 08/21/2023 PCP: Royann Shivers, PA-C  Patient coming from: Outside Hospital  Chief Complaint: No chief complaint on file.  HPI: [name] is a 53 y.o. female with medical history significant of ischemic cardiomyopathy, systolic congestive heart failure, (last left ventricular ejection fraction of 45%), coronary disease, type 2 diabetes mellitus on insulin, hypertension, chronic kidney disease stage III, peripheral arterial disease status post bilateral above-knee amputation,anxiety disorder.  Patient is a poor historian.  Most of the history was obtained from ER records accompanying patient and also partly from patient.  Patient had reported to [facility] ER with complaints of progressive worsening shortness of breath, abdominal distention and edema in her extremities.  Patient is unable to confirm if she has been compliant with her medications including diuretics.  In the ED, workup did reveal serum sodium of 130, BNP greater than 70,000, bilateral pleural effusion on x-ray.  CT scan suggest anasarca.  Patient arrived in the hospital/MedSurg, she was noted to be drowsy but easily arousable.  Answers to some questions appropriately.  Denied any chronic use of pain medication.  Repeat EKG on this admission shows elevated QTc.  EKG accompanying patient to suggest QTc of 489.  Repeat labs have been ordered and pending.  Review of Systems: Unable to review all systems due to lack of cooperation from patient. Past Medical History:  Diagnosis Date   Anemia 02/05/2012   Anxiety    Arthritis    Asthma    B12 deficiency    CKD (chronic kidney disease) stage 3, GFR 30-59 ml/min (HCC) 09/25/2021   COPD (chronic obstructive pulmonary disease) (HCC)    Depression    Diabetes (HCC)    Elevated LFTs    Essential hypertension    Fatty liver     Folate deficiency    GERD (gastroesophageal reflux disease)    Headache(784.0)    Heart failure (HCC)    PAD (peripheral artery disease) (HCC)    Status post bilateral AKA   PE (pulmonary embolism)    Xarelto   Pneumonia 2013   PVC (premature ventricular contraction)    Sepsis (HCC)    Type 2 diabetes mellitus (HCC)    Vitamin D deficiency    Past Surgical History:  Procedure Laterality Date   ABDOMINAL AORTOGRAM W/LOWER EXTREMITY N/A 09/05/2021   Procedure: ABDOMINAL AORTOGRAM W/LOWER EXTREMITY;  Surgeon: [name], MD;  Location: [facility] INVASIVE CV LAB;  Service: Cardiovascular;  Laterality: N/A;   AMPUTATION Right 09/24/2021   Procedure: RIGHT ABOVE KNEE AMPUTATION;  Surgeon: [name], MD;  Location: [facility] OR;  Service: Vascular;  Laterality: Right;   APPENDECTOMY  2013   CHOLECYSTECTOMY     DIAGNOSTIC LAPAROSCOPY     ESOPHAGEAL DILATION N/A 10/09/2014   Procedure: ESOPHAGEAL DILATION;  Surgeon: West Bali, MD;  Location: [facility] ORS;  Service: Endoscopy;  Laterality: N/A;  #15 and #16 savory   ESOPHAGOGASTRODUODENOSCOPY (EGD) WITH PROPOFOL N/A 10/09/2014   Dr. Darrick Penna: mild erosive gastritis, Savary dilation. Nausea and vomiting most likely due to uncontroleld blood sugars, reflux, and gastritis.    Social History:  reports that she has been smoking cigarettes. She has a 20 pack-year smoking history. She has been exposed to tobacco smoke. She has never used smokeless tobacco. She reports that she does not drink alcohol and does not use drugs.  No Known Allergies  Family History  Problem Relation Age of Onset   Breast cancer Mother    Kidney disease Sister    Diabetes Sister    Breast cancer Sister    Diabetes Brother    Colon cancer Neg Hx    Liver disease Neg Hx     Prior to Admission medications   Medication Sig Start Date End Date Taking? Authorizing Provider  ALPRAZolam Prudy Feeler) 0.5 MG tablet Take 1 tablet (0.5 mg total) by mouth 3 (three) times daily as  needed for anxiety. 10/02/21   Burnadette Pop, MD  amLODipine (NORVASC) 2.5 MG tablet Take 2.5 mg by mouth daily. 04/15/23   [provider]  aspirin EC 81 MG tablet Take 81 mg by mouth daily. Swallow whole.    [provider]  clopidogrel (PLAVIX) 75 MG tablet Take 75 mg by mouth daily.    [provider]  docusate sodium (COLACE) 100 MG capsule Take 100 mg by mouth as needed.    [provider]  DULoxetine (CYMBALTA) 30 MG capsule Take 30 mg by mouth daily. 06/10/23   [provider]  ENTRESTO 24-26 MG TAKE 1 TABLET BY MOUTH TWICE DAILY 03/17/23   Jonelle Sidle, MD  gabapentin (NEURONTIN) 400 MG capsule Take 400 mg by mouth 4 (four) times daily.    [provider]  insulin glargine (LANTUS) 100 unit/mL SOPN Inject 40 Units into the skin 2 (two) times daily.    [provider]  JARDIANCE 25 MG TABS tablet Take 25 mg by mouth daily. 06/15/22   [provider]  linaclotide (LINZESS) 72 MCG capsule Take 72 mcg by mouth as needed.    [provider]  methocarbamol (ROBAXIN) 500 MG tablet Take 500 mg by mouth 2 (two) times daily. 05/05/21   [provider]  ondansetron (ZOFRAN) 4 MG tablet Take 4 mg by mouth every 6 (six) hours as needed for nausea or vomiting.    [provider]  OZEMPIC, 0.25 OR 0.5 MG/DOSE, 2 MG/3ML SOPN Inject 0.5 mg into the skin once a week. 07/31/22   [provider]  pantoprazole (PROTONIX) 40 MG tablet Take 1 tablet (40 mg total) by mouth daily. 10/02/21   Burnadette Pop, MD  polyethylene glycol powder (GLYCOLAX/MIRALAX) 17 GM/SCOOP powder Take one capful in 6 ounces of liquid twice daily until soft stool, then continue once daily. 03/30/23   Tiffany Kocher, PA-C  propranolol ER (INDERAL LA) 160 MG SR capsule Take 1 capsule (160 mg total) by mouth daily. 06/21/23   Jonelle Sidle, MD  rosuvastatin (CRESTOR) 40 MG tablet Take 40 mg by mouth daily. 05/18/23   [provider]  topiramate (TOPAMAX) 50 MG tablet Take 50 mg by mouth 2 (two) times daily. 07/14/22   [provider]  VENTOLIN HFA 108 (90 Base) MCG/ACT inhaler SMARTSIG:2 Puff(s) By Mouth PRN 06/10/23   [provider]  vitamin B-12 1000 MCG tablet Take 1 tablet (1,000 mcg total) by mouth daily. 10/03/21   Burnadette Pop, MD    Physical Exam: Vitals:   08/21/23 2128  BP: 94/61  Resp: 12  Temp: (!) 97.5 F (36.4 C)  TempSrc: Oral  SpO2: 93%  Weight: 74.6 kg   General: Chronically ill-appearing female.  She is arousable but easily drifts to sleep. HEENT: Difficult to assess.  Oral mucosa looks moist.  Dentition could not be fully assessed.  Patient has nasal cannula in place Neck: Supple with no obvious JVD.  No palpable  organomegaly Chest: Diminished bilaterally at the base of the lungs.  No Rales appreciated however Abdomen: Slightly distended.  No palpable organomegaly. Extremities: Significant for bilateral above-knee amputation.  No significant open ulcers.  Slight edema appreciated Skin: Significant for sacral decubitus ulcer.  Unable to stage at this time CNS: No obvious focal deficit.  Difficulty to assess cranial nerves at this time due to patient's condition Data Reviewed:  Sodium 130 (L) 135 - 145 mmol/L  Potassium 4.4 3.5 - 5.0 mmol/L  Chloride 96 (L) 98 - 107 mmol/L  CO2 23.0 21.0 - 32.0 mmol/L  Anion Gap 11 3 - 11 mmol/L  BUN 11 8 - 20 mg/dL  Creatinine 4.09 (H) 8.11 - 1.10 mg/dL  Glucose 914 (H) 70 - 782 mg/dL  Calcium 8.2 (L) 8.5 - 10.1 mg/dL  Albumin 2.8 (L) 3.5 - 5.0 g/dL  Total Protein 6.3 6.0 - 8.0 g/dL  Total Bilirubin 0.5 0.3 - 1.2 mg/dL  AST 17 15 - 40 U/L  ALT 11 (L) 12 - 78 U/L  Alkaline Phosphatase 127 (H) 46 - 116 U/L  Magnesium 1.5 (L) 1.6 - 2.6 mg/dL  Lipase 25 16 - 77 U/L  PRO-BNP >70,000.0 (H) 0.0 - 125.0 pg/mL  hsTroponin I 202 (HH) <=34 ng/L  hsTroponin I (serial 2-6H CONTINUATION w/ delta)  hsTroponin I 193 (HH) <=34  ng/L  delta hsTroponin I 9 (H) <=7 ng/L  CBC w/ Differential  WBC 6.7 4.0 - 10.5 10*9/L  HGB 11.4 (L) 11.5 - 15.0 g/dL  HCT 95.6 21.3 - 08.6 %  MCV 89.9 80.0 - 98.0 fL  MCH 29.5 27.0 - 34.0 pg  MCHC 32.8 32.0 - 36.0 g/dL  RDW 57.8 (H) 46.9 - 62.9 %  MPV 12.4 (H) 7.4 - 10.4 fL  Platelet 225 140 - 415 10*9/L  Neutrophils % 64.0 %  Lymphocytes % 25.1 %  Monocytes % 8.4 %  Eosinophils % 0.9 %  Basophils % 1.0 %  Absolute Neutrophils 4.3 1.8 - 7.8 10*9/L  Absolute Lymphocytes 1.7 0.7 - 4.5 10*9/L  Absolute Monocytes 0.6 0.1 - 1.0 10*9/L  Absolute Eosinophils 0.1 0.0 - 0.4 10*9/L  Absolute Basophils 0.1 0.0 - 0.2 10*9/L  Urinalysis with Microscopy with Culture Reflex  Result Value Ref Range  Color, UA Light Yellow  Clarity, UA Clear Clear  Specific Gravity, UA 1.020 1.010 - 1.025  pH, UA 5.5 5.0 - 8.0  Leukocyte Esterase, UA Trace (A) Negative  Nitrite, UA Negative Negative  Protein, UA 100 mg/dL (A) Negative  Glucose, UA Negative Negative  Ketones, UA Negative Negative  Urobilinogen, UA 0.2 mg/dL 0.1 - 1.0 mg/dL  Bilirubin, UA Negative Negative  Blood, UA Small (A) Negative  RBC, UA 2 0 - 5 /HPF  WBC, UA 1 <=5 /HPF  Bacteria, UA None Seen Few, Occasional, Small, None Seen /HPF   CT Abdomen Pelvis Wo Contrast Result Date: 08/20/2023 CLINICAL DATA: Abdominal swelling, decreased urinary output associated with lower abdominal and back pain EXAM: CT ABDOMEN AND PELVIS WITHOUT CONTRAST TECHNIQUE: Multidetector CT imaging of the abdomen and pelvis was performed following the standard protocol without IV contrast. RADIATION DOSE REDUCTION: This exam was performed according to the departmental dose-optimization program which includes automated exposure control, adjustment of the mA and/or kV according to patient size and/or use of iterative reconstruction technique. COMPARISON: CTA abdomen and pelvis dated 07/19/2023 FINDINGS: Lower chest: Bibasilar subsegmental atelectasis. Similar  moderate right and small left pleural effusions. Multichamber cardiomegaly. Coronary artery calcifications. Hepatobiliary: No focal  hepatic lesions. No intra or extrahepatic biliary ductal dilation. Cholecystectomy. Pancreas: No focal lesions or main ductal dilation. Spleen: Normal in size without focal abnormality. Adrenals/Urinary Tract: No adrenal nodules. Atrophic right kidney. No hydronephrosis. No suspicious renal masses by noncontrast technique. No calculi. Underdistended urinary bladder. Stomach/Bowel: Normal appearance of the stomach. No evidence of bowel wall thickening, distention, or inflammatory changes. Appendix is not discretely seen. Vascular/Lymphatic: Aortic atherosclerosis. Right femoral artery stent. No enlarged abdominal or pelvic lymph nodes. Reproductive: No adnexal masses. Other: Small volume ascites. Musculoskeletal: No acute or abnormal lytic or blastic osseous lesions. Increased body wall edema.   1. No acute abdominopelvic findings. 2. Increased body wall edema. 3. Similar moderate right and small left pleural effusions and small volume ascites. 4. Aortic Atherosclerosis (ICD10-I70.0). Coronary artery calcifications. Assessment for potential risk factor modification, dietary therapy or pharmacologic therapy may be warranted, if clinically indicated. Electronically Signed By: Agustin Cree M.D. On: 08/20/2023 17:27   XR Chest 2 views Result Date: 08/20/2023 CLINICAL DATA: Shortness of breath EXAM: CHEST - 2 VIEW COMPARISON: Chest radiograph dated 07/23/2023 FINDINGS: Low lung volumes with bronchovascular crowding. Bibasilar linear and patchy opacities. Slightly increased moderate right and small left pleural effusions. Similar enlarged cardiomediastinal silhouette. No acute osseous abnormality.   1. Slightly increased moderate right and small left pleural effusions. 2. Bibasilar linear and patchy opacities, likely atelectasis. Electronically Signed By: Agustin Cree M.D. On: 08/20/2023 17:12    Assessment and Plan: 53 year old female with multiple medical comorbidities that include coronary disease, ischemic cardiomyopathy with EF~45%, status post bilateral amputation, type 2 diabetes mellitus on insulin, chronic kidney disease stage III.  Patient presents on account of acute shortness of breath, abdominal distention and lower extremity edema.  Admitted on account of acute CHF exacerbation.  It is unclear to establish how compliant patient has been with medications  #1.  Acute congestive heart failure with gross anasarca and bilateral pleural effusion. At baseline, patient is on furosemide 20 mg daily.  Will increase diuretics to 40 mg daily. Consult with cardiology for optimization of CHF management.  Strict input and output monitoring.  Restrict fluids to less than 1500.  Daily weight monitoring. Resume home medications per GDMT.Meds that include Entresto, Jardiance  #2.  QTc prolongation per EKG.  Prior EKG from last night shows QTc of 489.  Will hold medications that could potentially prolong QTc.  Patient will be monitored on telemetry overnight.  Correct all electrolyte abnormalities including potassium and magnesium. Known history of frequent PVCs.  Patient is supposed to be on propranolol.  Prior admissions had reported bradycardia arrhythmia.  Cardiology had recommended continuation of propranolol during last admission.  #3.  Elevated cardiac markers from baseline: Likely could be type II secondary to congestive heart failure.  No indication for anticoagulation with heparin at this time.  Will defer any further workup to cardiology team.  #4.  Acute metabolic cephalopathy: Likely medication induced.  Will avoid opiates and benzodiazepines for now.Records from [facility] nursing team) suggests patient received a total of 24mg  of morphine since 1/24 at 20:55, 900 mg of gabapentin & 100 mg topamax . All these medications likely may be contributing to her encephalopathy   #5  generalized body pain: Will avoid opiates for now due to altered mental status  #6.  Severe peripheral arterial disease status post bilateral above-knee amputation: Chronic.  Patient will continue on aspirin, Plavix and statin.  #7.  Chronic kidney disease stage III  #8.  Type 2 diabetes mellitus on  insulin therapy.  Serum creatinine on this admission was 1.41.  Patient currently on Lantus 10 units twice daily.  Will revise dose and monitor fingerstick glucose carefully.  #9: History of ongoing tobacco dependence: Continue with nicotine patch  #10.  History of obstructive sleep apnea: Referral was made to sleep clinic.  Unclear if patient did follow-up.  ABG will be done tonight due to increased somnolence.  #11. Hypervolemic Hyponatremia  #12.GERD- Continue with PPI   Advance Care Planning:   Code Status: Full Code   Consults: Cardiology  Family Communication: no family at bedside at this time  Severity of Illness: The appropriate patient status for this patient is INPATIENT. Inpatient status is judged to be reasonable and necessary in order to provide the required intensity of service to ensure the patient's safety. The patient's presenting symptoms, physical exam findings, and initial radiographic and laboratory data in the context of their chronic comorbidities is felt to place them at high risk for further clinical deterioration. Furthermore, it is not anticipated that the patient will be medically stable for discharge from the hospital within 2 midnights of admission.   * I certify that at the point of admission it is my clinical judgment that the patient will require inpatient hospital care spanning beyond 2 midnights from the point of admission due to high intensity of service, high risk for further deterioration and high frequency of surveillance required.*  Author: Lilia Pro, MD 08/21/2023 10:27 PM  For on call review [email or web address].

## 2023-08-22 ENCOUNTER — Inpatient Hospital Stay (HOSPITAL_COMMUNITY): Payer: 59

## 2023-08-22 DIAGNOSIS — E119 Type 2 diabetes mellitus without complications: Secondary | ICD-10-CM

## 2023-08-22 DIAGNOSIS — J449 Chronic obstructive pulmonary disease, unspecified: Secondary | ICD-10-CM

## 2023-08-22 DIAGNOSIS — E669 Obesity, unspecified: Secondary | ICD-10-CM

## 2023-08-22 DIAGNOSIS — E1122 Type 2 diabetes mellitus with diabetic chronic kidney disease: Secondary | ICD-10-CM | POA: Diagnosis not present

## 2023-08-22 DIAGNOSIS — N1832 Chronic kidney disease, stage 3b: Secondary | ICD-10-CM

## 2023-08-22 DIAGNOSIS — I5033 Acute on chronic diastolic (congestive) heart failure: Secondary | ICD-10-CM

## 2023-08-22 DIAGNOSIS — I10 Essential (primary) hypertension: Secondary | ICD-10-CM

## 2023-08-22 DIAGNOSIS — E1169 Type 2 diabetes mellitus with other specified complication: Secondary | ICD-10-CM | POA: Insufficient documentation

## 2023-08-22 LAB — CBC WITH DIFFERENTIAL/PLATELET
Abs Immature Granulocytes: 0.03 10*3/uL (ref 0.00–0.07)
Basophils Absolute: 0.1 10*3/uL (ref 0.0–0.1)
Basophils Relative: 1 %
Eosinophils Absolute: 0.3 10*3/uL (ref 0.0–0.5)
Eosinophils Relative: 3 %
HCT: 36 % (ref 36.0–46.0)
Hemoglobin: 11.5 g/dL — ABNORMAL LOW (ref 12.0–15.0)
Immature Granulocytes: 0 %
Lymphocytes Relative: 38 %
Lymphs Abs: 3.1 10*3/uL (ref 0.7–4.0)
MCH: 29.6 pg (ref 26.0–34.0)
MCHC: 31.9 g/dL (ref 30.0–36.0)
MCV: 92.5 fL (ref 80.0–100.0)
Monocytes Absolute: 0.9 10*3/uL (ref 0.1–1.0)
Monocytes Relative: 10 %
Neutro Abs: 3.9 10*3/uL (ref 1.7–7.7)
Neutrophils Relative %: 48 %
Platelets: 250 10*3/uL (ref 150–400)
RBC: 3.89 MIL/uL (ref 3.87–5.11)
RDW: 17 % — ABNORMAL HIGH (ref 11.5–15.5)
WBC: 8.3 10*3/uL (ref 4.0–10.5)
nRBC: 0 % (ref 0.0–0.2)

## 2023-08-22 LAB — HEPATIC FUNCTION PANEL
ALT: 13 U/L (ref 0–44)
AST: 28 U/L (ref 15–41)
Albumin: 2.8 g/dL — ABNORMAL LOW (ref 3.5–5.0)
Alkaline Phosphatase: 177 U/L — ABNORMAL HIGH (ref 38–126)
Bilirubin, Direct: 0.2 mg/dL (ref 0.0–0.2)
Indirect Bilirubin: 0.6 mg/dL (ref 0.3–0.9)
Total Bilirubin: 0.8 mg/dL (ref 0.0–1.2)
Total Protein: 5.7 g/dL — ABNORMAL LOW (ref 6.5–8.1)

## 2023-08-22 LAB — BLOOD GAS, VENOUS
Acid-base deficit: 13 mmol/L — ABNORMAL HIGH (ref 0.0–2.0)
Acid-base deficit: 10 mmol/L — ABNORMAL HIGH (ref 0.0–2.0)
Acid-base deficit: 2 mmol/L (ref 0.0–2.0)
Bicarbonate: 18.3 mmol/L — ABNORMAL LOW (ref 20.0–28.0)
Bicarbonate: 21.1 mmol/L (ref 20.0–28.0)
Bicarbonate: 23.1 mmol/L (ref 20.0–28.0)
Drawn by: 8326
O2 Saturation: 98 %
O2 Saturation: 56.3 %
O2 Saturation: 88.4 %
Patient temperature: 35.6
Patient temperature: 35
Patient temperature: 37
pCO2, Ven: 62 mm[Hg] — ABNORMAL HIGH (ref 44–60)
pCO2, Ven: 65 mm[Hg] — ABNORMAL HIGH (ref 44–60)
pCO2, Ven: 40 mm[Hg] — ABNORMAL LOW (ref 44–60)
pH, Ven: 7.07 — CL (ref 7.25–7.43)
pH, Ven: 7.11 — CL (ref 7.25–7.43)
pH, Ven: 7.37 (ref 7.25–7.43)
pO2, Ven: 92 mm[Hg] — ABNORMAL HIGH (ref 32–45)
pO2, Ven: 36 mm[Hg] (ref 32–45)
pO2, Ven: 56 mm[Hg] — ABNORMAL HIGH (ref 32–45)

## 2023-08-22 LAB — BLOOD GAS, ARTERIAL
Acid-base deficit: 3.6 mmol/L — ABNORMAL HIGH (ref 0.0–2.0)
Bicarbonate: 21.5 mmol/L (ref 20.0–28.0)
O2 Saturation: 98 %
Patient temperature: 37
pCO2 arterial: 38 mm[Hg] (ref 32–48)
pH, Arterial: 7.36 (ref 7.35–7.45)
pO2, Arterial: 89 mm[Hg] (ref 83–108)

## 2023-08-22 LAB — PROCALCITONIN: Procalcitonin: 0.1 ng/mL

## 2023-08-22 LAB — GLUCOSE, CAPILLARY
Glucose-Capillary: 163 mg/dL — ABNORMAL HIGH (ref 70–99)
Glucose-Capillary: 40 mg/dL — CL (ref 70–99)
Glucose-Capillary: 95 mg/dL (ref 70–99)
Glucose-Capillary: 50 mg/dL — ABNORMAL LOW (ref 70–99)
Glucose-Capillary: 122 mg/dL — ABNORMAL HIGH (ref 70–99)
Glucose-Capillary: 99 mg/dL (ref 70–99)
Glucose-Capillary: 91 mg/dL (ref 70–99)
Glucose-Capillary: 95 mg/dL (ref 70–99)
Glucose-Capillary: 42 mg/dL — CL (ref 70–99)
Glucose-Capillary: 94 mg/dL (ref 70–99)
Glucose-Capillary: 149 mg/dL — ABNORMAL HIGH (ref 70–99)
Glucose-Capillary: 26 mg/dL — CL (ref 70–99)
Glucose-Capillary: 61 mg/dL — ABNORMAL LOW (ref 70–99)
Glucose-Capillary: 125 mg/dL — ABNORMAL HIGH (ref 70–99)
Glucose-Capillary: 99 mg/dL (ref 70–99)
Glucose-Capillary: 90 mg/dL (ref 70–99)

## 2023-08-22 LAB — BASIC METABOLIC PANEL
Anion gap: 9 (ref 5–15)
BUN: 18 mg/dL (ref 6–20)
CO2: 22 mmol/L (ref 22–32)
Calcium: 8.5 mg/dL — ABNORMAL LOW (ref 8.9–10.3)
Chloride: 99 mmol/L (ref 98–111)
Creatinine, Ser: 1.76 mg/dL — ABNORMAL HIGH (ref 0.44–1.00)
GFR, Estimated: 34 mL/min — ABNORMAL LOW (ref >60)
Glucose, Bld: 53 mg/dL — ABNORMAL LOW (ref 70–99)
Potassium: 4.3 mmol/L (ref 3.5–5.1)
Sodium: 130 mmol/L — ABNORMAL LOW (ref 135–145)

## 2023-08-22 LAB — LACTIC ACID, PLASMA
Lactic Acid, Venous: 1 mmol/L (ref 0.5–1.9)
Lactic Acid, Venous: 1 mmol/L (ref 0.5–1.9)

## 2023-08-22 LAB — HIV ANTIBODY (ROUTINE TESTING W REFLEX): HIV Screen 4th Generation wRfx: NONREACTIVE

## 2023-08-22 LAB — AMMONIA: Ammonia: 50 umol/L — ABNORMAL HIGH (ref 9–35)

## 2023-08-22 LAB — TSH: TSH: 4.79 u[IU]/mL — ABNORMAL HIGH (ref 0.350–4.500)

## 2023-08-22 LAB — MAGNESIUM: Magnesium: 2.3 mg/dL (ref 1.7–2.4)

## 2023-08-22 MED ORDER — DEXTROSE 50 % IV SOLN
INTRAVENOUS | Status: AC
  Administered 2023-08-22: 25 g via INTRAVENOUS
  Filled 2023-08-22: qty 50

## 2023-08-22 MED ORDER — FUROSEMIDE 10 MG/ML IJ SOLN: 60.0000 mg | Freq: Two times a day (BID) | INTRAMUSCULAR | Status: DC

## 2023-08-22 MED ORDER — DEXTROSE 50 % IV SOLN
25.0000 g | INTRAVENOUS | Status: AC
  Administered 2023-08-22: 25 g via INTRAVENOUS
  Filled 2023-08-22: qty 50

## 2023-08-22 MED ORDER — DEXTROSE 50 % IV SOLN: 25.0000 g | INTRAVENOUS | Status: AC

## 2023-08-22 MED ORDER — DEXTROSE 50 % IV SOLN: 12.5000 g | INTRAVENOUS | Status: AC

## 2023-08-22 MED ORDER — INSULIN GLARGINE-YFGN 100 UNIT/ML ~~LOC~~ SOLN
10.0000 [IU] | Freq: Two times a day (BID) | SUBCUTANEOUS | Status: DC
  Filled 2023-08-22 (×2): qty 0.1

## 2023-08-22 MED ORDER — NALOXONE HCL 0.4 MG/ML IJ SOLN: 0.4000 mg | INTRAMUSCULAR | Status: DC | PRN

## 2023-08-22 MED ORDER — NALOXONE HCL 0.4 MG/ML IJ SOLN
INTRAMUSCULAR | Status: AC
  Administered 2023-08-22: 0.4 mg via INTRAVENOUS
  Filled 2023-08-22: qty 1

## 2023-08-22 MED ORDER — FUROSEMIDE 10 MG/ML IJ SOLN
60.0000 mg | Freq: Two times a day (BID) | INTRAMUSCULAR | Status: DC
  Administered 2023-08-22 – 2023-08-25 (×8): 60 mg via INTRAVENOUS
  Filled 2023-08-22 (×8): qty 6

## 2023-08-22 MED ORDER — DEXTROSE 50 % IV SOLN
12.5000 g | INTRAVENOUS | Status: AC
  Administered 2023-08-22: 25 g via INTRAVENOUS

## 2023-08-22 MED ORDER — DEXTROSE 50 % IV SOLN: 1.0000 | Freq: Once | INTRAVENOUS | Status: DC

## 2023-08-22 MED ORDER — DEXTROSE-SODIUM CHLORIDE 5-0.9 % IV SOLN: INTRAVENOUS | Status: DC

## 2023-08-22 MED ORDER — IPRATROPIUM-ALBUTEROL 0.5-2.5 (3) MG/3ML IN SOLN
3.0000 mL | Freq: Four times a day (QID) | RESPIRATORY_TRACT | Status: DC
  Administered 2023-08-22 – 2023-08-23 (×4): 3 mL via RESPIRATORY_TRACT
  Filled 2023-08-22 (×4): qty 3

## 2023-08-22 MED ORDER — DEXTROSE 10 % IV SOLN: INTRAVENOUS | Status: DC

## 2023-08-22 MED ORDER — DEXTROSE 50 % IV SOLN
25.0000 g | INTRAVENOUS | Status: DC | PRN
  Filled 2023-08-22: qty 50

## 2023-08-22 NOTE — Plan of Care (Signed)

## 2023-08-22 NOTE — Progress Notes (Signed)
TR[facility] night cross cover note:   I was notified by RN that this patient, who was admitted overnight for acute on chronic CHF, and has been lethargic the entire shift, now appears slightly more lethargic, and appears to be wheezing more than earlier in the shift.  Per my discussions with the patient's RN, the patient received several doses of morphine at [facility] prior to transfer.  Will try a dose of Narcan at this time to evaluate for any improvement in her lethargy with this measure.  ABG performed around midnight showed no evidence of uncompensated hypercapnia.  However, given the progression in the patient's lethargy, will check VBG at this time.  The patient also was noted to be hypomagnesemic at presentation, with magnesium level of 1.6, prompting receipt of 2 g of IV magnesium sulfate.  Given interval worsening and wheezing, will repeat chest x-ray at this time and also recheck magnesium level.  she has also had some runs of 4-5 beats of nonsustained V. tach. EKG ordered.  Per chart review, most recent potassium level was 4.3 around 4 AM.  Will follow for result of updated magnesium level, as above.    Newton Pigg, DO Hospitalist

## 2023-08-22 NOTE — Progress Notes (Signed)
I spoke with patient's sister over the phone, Mrs [email or web address] talked in detail about patient's condition, plan of care and prognosis and all questions were addressed. Patient critically ill.

## 2023-08-22 NOTE — Progress Notes (Signed)
RT attempted abg x's two. Unable to obtain sample at this time. Nurse notified. Nurse notified MD.

## 2023-08-22 NOTE — Hospital Course (Addendum)
[name] was admitted with the working diagnosis of heart failure exacerbation.   53 yo female with the past medical history of heart failure, coronary artery disease, T2DM, hypertension, bilateral BKA and CKD stage 3 who presented with progressive dyspnea, abdominal distention, and lower extremity edema. She was evaluated at [facility] ED, she was diagnosed with volume overload and was transferred to [facility] for further treatment and diagnosis.  At the time of her transfer she was somnolent, blood pressure was 94/61, HR 80, RR 12 and 02 saturation 93% on 3 L/min per [place].  Ill looking appearing, somnolent but able to arouse, lungs with decreased breath sounds bilaterally, heart with S1 and S2 present and regular, abdomen with distention but no organomegaly, bilateral BKA with trace peripheral edema.   ABG 7,36/ 38/ 89/ 21/ 98% Na 130, K 4,9 Cl 98, bicarbonate 22, glucose 109 bun 18 cr 1,79 Mg 1.6  AST 28 ALT 13  BNP >4,500  Wbc 7.1 hgb 10,8 plt 213 TSH 4,7   Chest radiograph with cardiomegaly, bilateral hilar vascular congestion, cephalization of the vasculature, bilateral pleural effusions.   EKG 73 bpm, right axis, normal intervals, sinus rhythm with PAC, with no significant ST segment or T wave changes.   01/26 patient continue very lethargic, she continue to have hypoglycemia. This morning having acute respiratory and metabolic acidosis.  Starting Bipap and checking lactate.  01/27 no lactic acidosis, respiratory acidosis  has improved. Patient with persistent hypoglycemia, required D5 and then D10 infusion.  01/28 mentation back to baseline, dextrose infusion has been discontinued and she is tolerating po well. Continue diuresis.  01/29 clinically continue to improve.

## 2023-08-22 NOTE — Progress Notes (Signed)
Rt called to place pt on bipap per abg results Rt placed to bipap pt tolerating current settings abg to follow

## 2023-08-22 NOTE — Progress Notes (Signed)
   08/21/23 2128  Assess: MEWS Score  Temp (!) 97.5 F (36.4 C)  BP 94/61  MAP (mmHg) 72  ECG Heart Rate 80  Resp 12  Level of Consciousness Responds to Voice  SpO2 93 %  O2 Device Nasal Cannula  O2 Flow Rate (L/min) 2 L/min  Assess: MEWS Score  MEWS Temp 0  MEWS Systolic 1  MEWS Pulse 0  MEWS RR 1  MEWS LOC 1  MEWS Score 3  MEWS Score Color Yellow  Assess: if the MEWS score is Yellow or Red  Were vital signs accurate and taken at a resting state? Yes  Does the patient meet 2 or more of the SIRS criteria? No  MEWS guidelines implemented  Yes, yellow  Treat  MEWS Interventions Considered administering scheduled or prn medications/treatments as ordered  Take Vital Signs  Increase Vital Sign Frequency  Yellow: Q2hr x1, continue Q4hrs until patient remains green for 12hrs  Escalate  MEWS: Escalate Yellow: Discuss with charge nurse and consider notifying provider and/or RRT  Notify: Charge Nurse/RN  Name of Charge Nurse/RN Notified Westley Chandler, RN  Provider Notification  Provider Name/Title Arlean Hopping, MD & Margette Fast, MD  Date Provider Notified 08/21/23  Time Provider Notified 2215  Method of Notification Page;Face-to-face  Notification Reason Other (Comment) (lethargic)  Provider response Other (Comment) (Acheampong coming to bedside)  Date of Provider Response 08/21/23  Time of Provider Response 2215  Assess: SIRS CRITERIA  SIRS Temperature  0  SIRS Respirations  0  SIRS Pulse 0  SIRS WBC 0  SIRS Score Sum  0

## 2023-08-22 NOTE — Significant Event (Signed)
Rapid Response Event Note   Reason for Call :  Decreased LOC  Initial Focused Assessment:  Patient arouses to light touch and voice but is very drowsy.  She will mumble answers and falls quickly back asleep.   Warm central, cool extremities, dry. Lung sounds decreased Heart tones irregular  SB with PVCs and short runs of VT  BP 79/49  HR 60-70  RR 10-18  O2 sat 92-96% on NRB Axillary temp 96.1 Rectal temp 95 CBG 95  Interventions:  12 lead EKG   Venous blood gas:  7.07/62/92/18 Bipap Rechecked CBG 50:  1 amp D50 Post CBG 122 Lactic Acid drawn  Level of care changed to Progressive Dr Ella Jubilee came to bedside  Plan of Care:  RN to call if patient becomes more somnolent   Event Summary:   MD Notified: Dr Ella Jubilee Call Time: [id number] Arrival Time: 1191 End Time: 1030  Marcellina Millin, RN

## 2023-08-22 NOTE — Assessment & Plan Note (Addendum)
CKD stage 3b. (Base serum cr 1,5) Hyponatremia.   Today renal function with serum cr at 1.70 with K at 3,7 and serum bicarbonate at 20  Na 126   Plan to continue diuresis for volume overload.  Follow up renal function in am,

## 2023-08-22 NOTE — Progress Notes (Signed)
   08/22/23 1156  Adult Ventilator Settings  Vent Type Servo i  Vent Mode PCV;BIPAP  Set Rate 18 bmp  FiO2 (%) 50 %  I Time 0.8 Sec(s)  Pressure Control 21 cmH20  PEEP 5 cmH20  Adult Ventilator Measurements  Peak Airway Pressure 19 L/min  Mean Airway Pressure 6 cmH20  Resp Rate Spontaneous 5 br/min  Resp Rate Total 23 br/min  Exhaled Vt 853 mL  Measured Ve 20.8 L  I:E Ratio Measured 1:3.1  Auto PEEP 0 cmH20  Total PEEP 5 cmH20  Adult Ventilator Alarms  Alarms On Y  Ve High Alarm 40 L/min  Ve Low Alarm 3.5 L/min  Resp Rate High Alarm 40 br/min  Resp Rate Low Alarm 10  PEEP Low Alarm 2 cmH2O  Press High Alarm 30 cmH2O  T Apnea 20 sec(s)   On assessment of pt they are more alert oriented following commands and answering questions and was able to lift her body and sit up on a 90 degree angle without assistance on above settings

## 2023-08-22 NOTE — Assessment & Plan Note (Signed)
No signs of acute exacerbation, continue bronchodilator therapy.

## 2023-08-22 NOTE — Progress Notes (Signed)
08/20/22 ~ 21:30 Pt received from [facility]. Pt orientated to person, place, time but disorientated to situation with episodes of echolalia. Pt is somnolent but will awake very briefly to speech. As staff was getting pt settled in room HR briefly dropped to 30's. 2 EKGs obtained w/ 1 critical result d/t prolonged Qtc. Attending TR[facility] provider Acheampong, MD notified via page & MD came to bedside to assess.   Update:  Scheduled gabapentin & topamax held per Acheampong, MD. Also unable to give PO entresto d/t continued somnolence. Per paperwork from [facility], pt received a total of 24 mg of IV morphine between 1/24 at 20:55 and 1/25 at 14:35. Acheampong, MD notified.

## 2023-08-22 NOTE — Progress Notes (Signed)
Unable to complete admission questions d/t pt's somnolence.

## 2023-08-22 NOTE — Progress Notes (Addendum)
TRH night cross cover note:   I was notified by RN of the patient's recurrent hypoglycemia, with most recent CBG 61 in spite of existing D10 running at 50 cc/h and multiple amps of D50.  I subsequently increased the rate of D10 to 75 cc/h. She is due for next dose of 60 mg of iv lasix at 2200 this evening.  RN confirmed that the patient has continuous glucose monitoring active, that she does not have an active insulin pump running at this time.  Per chart review, most recent hemoglobin A1c was 7.6% when checked yesterday.  Per chart review, no overt evidence of underlying infection that could be contributing to the patient's low blood sugar at this time. Will check UA and add-on procalcitonin level.    Newton Pigg, DO Hospitalist

## 2023-08-22 NOTE — Assessment & Plan Note (Signed)
Not able to calculate BMI Patient with bilateral BKA.  Poor mobility.

## 2023-08-22 NOTE — Progress Notes (Signed)
   08/22/23 1632  Adult Ventilator Settings  Vent Type Servo i  Vent Mode PCV;BIPAP  Set Rate (S)  24 bmp  FiO2 (%) 50 %  I Time 0.8 Sec(s)  Pressure Control 25 cmH20  PEEP 5 cmH20  Adult Ventilator Measurements  Peak Airway Pressure 24 L/min  Mean Airway Pressure 11 cmH20  Resp Rate Spontaneous 1 br/min  Resp Rate Total 25 br/min  Exhaled Vt 619 mL  Measured Ve 16.2 L  I:E Ratio Measured 1:2.1  Auto PEEP 0 cmH20  Total PEEP 5 cmH20  Adult Ventilator Alarms  Alarms On Y  Ve High Alarm 40 L/min  Ve Low Alarm 3.5 L/min  Resp Rate High Alarm 40 br/min  Resp Rate Low Alarm 10  PEEP Low Alarm 2 cmH2O  Press High Alarm 30 cmH2O  T Apnea 20 sec(s)   Increased PC to 20 and increased rate to 24RR per md order and per abg results RN aware

## 2023-08-22 NOTE — Assessment & Plan Note (Addendum)
Hypoglycemia, with acute metabolic encephalopathy.   She was required D5 and then D10  Now off IV dextrose infusion and tolerating po well.   Will re introduce insulin therapy with sliding scale first. Continue glucose monitoring.   Diabetic neuropathy, hold on gabapentin and tomopax, due to recovering from encephalopathy.   Continue with statin therapy.

## 2023-08-22 NOTE — Assessment & Plan Note (Addendum)
Echocardiogram with reduced LV systolic function 30 to 35%, RV systolic function with moderate reduction, LA with severe dilatation, RA with moderate dilatation, moderate to severe mitral regurgitation, mild aortic valve stenosis, trivial pericardial effusion,   Documented urine output is 2,500  cc  Systolic blood pressure 120 mmHg  Continue furosemide to 60 mg IV bid metoprolol 25 mg succinate.   Continue to hold entresto. Continue to hold on amlodipine.  Caution with SGLT 2 inh due to recovering from severe metabolic and respiratory acidosis.   Acute hypoxemic and hypercapnic respiratory failure due to cardiogenic pulmonary edema.  Improved ventilation with Bipap (pressure control), her last VBG 7.37/ 40/ 56/ 23/ 88.4%  97% oxygenation on room air.

## 2023-08-22 NOTE — Progress Notes (Addendum)
Patient with improved mentation, she is more reactive.  Her follow up VBG 7,11/ 65/36/21/56% Changes made to Bipap, to improve ventilation.  PC pressure increased to 20 cm, keep PEEP 5 and respiratory rate increased to 24., with good toleration, Vt 619 ml   Follow VBG 18:00   Capillary glucose 45, patient has been on D5NS at 50 ml per hr since this morning.  Plan to change from IV D5NS to D10, continue close capillary glucose monitoring,

## 2023-08-22 NOTE — Progress Notes (Addendum)
Progress Note   Patient: [name]:[id number] DOB: 1971/03/05 DOA: 08/21/2023     1 DOS: the patient was seen and examined on 08/22/2023   Brief hospital course: [name] was admitted with the working diagnosis of heart failure exacerbation.   53 yo female with the past medical history of heart failure, coronary artery disease, T2DM, hypertension, bilateral BKA and CKD stage 3 who presented with progressive dyspnea, abdominal distention, and lower extremity edema. She was evaluated at [facility] ED, she was diagnosed with volume overload and was transferred to [facility] for further treatment and diagnosis.  At the time of her transfer she was somnolent, blood pressure was 94/61, HR 80, RR 12 and 02 saturation 93% on 3 L/min per [place].  Ill looking appearing, somnolent but able to arouse, lungs with decreased breath sounds bilaterally, heart with S1 and S2 present and regular, abdomen with distention but no organomegaly, bilateral BKA with trace peripheral edema.   ABG 7,36/ 38/ 89/ 21/ 98% Na 130, K 4,9 Cl 98, bicarbonate 22, glucose 109 bun 18 cr 1,79 Mg 1.6  AST 28 ALT 13  BNP >4,500  Wbc 7.1 hgb 10,8 plt 213 TSH 4,7   Chest radiograph with cardiomegaly, bilateral hilar vascular congestion, cephalization of the vasculature, bilateral pleural effusions.   EKG 73 bpm, right axis, normal intervals, sinus rhythm with PAC, with no significant ST segment or T wave changes.   01/26 patient continue very lethargic, she continue to have hypoglycemia. This morning having acute respiratory and metabolic acidosis.  Starting Bipap and checking lactate.   Assessment and Plan: * Acute on chronic diastolic CHF (congestive heart failure) (HCC) 2021 echocardiogram with mild reduction in LV systolic function 45 to 50% EF, no significant valvular disease.   Patient continue volume overloaded.  Systolic blood pressure 100 mmHg  Plan to increase furosemide to 60 mg IV bid Limited therapy due  to risk of hypotension, will hold on B blocker and entresto. Discontinue amlodipine.  Follow up on new echocardiogram,  Check lactate.  She has high risk for cardiogenic shock, will upgrade to progressive level of care.   Acute hypoxemic and hypercapnic respiratory failure due to cardiogenic pulmonary edema.  Follow up VBG 7.0/62/92/18/98.  Plan to continue diuresis  Follow up VBG this pm.   Type 2 diabetes mellitus (HCC) Hypoglycemia, with acute metabolic encephalopathy.   Patient on basal insulin and sliding scale.  She has been hypoglycemic last night and this morning.  Plan to continue glucose monitoring.  Will place hypoglycemia protocol  Hold all insulin,  Adding D5NS at 50 ml; per hr   Diabetic neuropathy, hold on gabapentin and tomopax.   CKD (chronic kidney disease) stage 3, GFR 30-59 ml/min (HCC) CKD stage 3b. (Base serum cr 1,5) Hyponatremia.   Renal function with serum cr at 1,76 with K at 4,3 and serum bicarbonate at 22  Na 130 Mg 2,3   Plan to continue diuresis for volume overload.  Follow up renal function in am,   COPD (chronic obstructive pulmonary disease) (HCC) No signs of acute exacerbation, continue bronchodilator therapy.   Obesity (BMI 30-39.9) Not able to calculate BMI Patient with bilateral BKA.  Poor mobility.         Subjective: Patient is somnolent but easy to arouse, no chest pain, no apparent dyspnea, very weak and deconditioned.   Physical Exam: Vitals:   08/22/23 0412 08/22/23 0511 08/22/23 0611 08/22/23 0743  BP: 112/64 91/61 103/82 (!) 100/53  Pulse:  71  Resp: 16 15 13 13   Temp:      TempSrc:      SpO2: 100% 94% 95%   Weight:       Neurology, somnolent and lethargic, easy to arouse, able to respond to simple question,  ENT with mild pallor Cardiovascular S1 and S2 present, extra beats, no gallops, no rubs or murmurs Positive JVD Respiratory with rales bilaterally, with decreased breath sounds bilaterally with poor  inspiratory effort, no wheezing Abdomen with no distention  Bilateral BKA, positive pitting edema +++ at the thighs  Data Reviewed:    Family Communication: no family at the bedside   Disposition: Status is: Inpatient Remains inpatient appropriate because: heart failure   Planned Discharge Destination: Home    Time spent: critical care time 60 minutes Patient critically ill with decompensated heart failure, acute metabolic encephalopathy and respiratory failure.  Need non invasive mechanical ventilation,  Close monitoring hemodynamics,   Author: Coralie Keens, MD 08/22/2023 9:20 AM  For on call review [email or web address].

## 2023-08-23 ENCOUNTER — Other Ambulatory Visit (HOSPITAL_COMMUNITY): Payer: Self-pay

## 2023-08-23 ENCOUNTER — Inpatient Hospital Stay (HOSPITAL_COMMUNITY): Payer: 59

## 2023-08-23 ENCOUNTER — Encounter (HOSPITAL_COMMUNITY): Payer: Self-pay | Admitting: Internal Medicine

## 2023-08-23 ENCOUNTER — Other Ambulatory Visit: Payer: Self-pay

## 2023-08-23 DIAGNOSIS — I5021 Acute systolic (congestive) heart failure: Secondary | ICD-10-CM | POA: Diagnosis not present

## 2023-08-23 DIAGNOSIS — E1122 Type 2 diabetes mellitus with diabetic chronic kidney disease: Secondary | ICD-10-CM | POA: Diagnosis not present

## 2023-08-23 DIAGNOSIS — I10 Essential (primary) hypertension: Secondary | ICD-10-CM | POA: Diagnosis not present

## 2023-08-23 DIAGNOSIS — I5043 Acute on chronic combined systolic (congestive) and diastolic (congestive) heart failure: Secondary | ICD-10-CM | POA: Diagnosis not present

## 2023-08-23 DIAGNOSIS — N1832 Chronic kidney disease, stage 3b: Secondary | ICD-10-CM | POA: Diagnosis not present

## 2023-08-23 DIAGNOSIS — I5033 Acute on chronic diastolic (congestive) heart failure: Secondary | ICD-10-CM | POA: Diagnosis not present

## 2023-08-23 LAB — CBC WITH DIFFERENTIAL/PLATELET
Abs Immature Granulocytes: 0.03 10*3/uL (ref 0.00–0.07)
Basophils Absolute: 0.1 10*3/uL (ref 0.0–0.1)
Basophils Relative: 1 %
Eosinophils Absolute: 0.1 10*3/uL (ref 0.0–0.5)
Eosinophils Relative: 1 %
HCT: 30.9 % — ABNORMAL LOW (ref 36.0–46.0)
Hemoglobin: 10.1 g/dL — ABNORMAL LOW (ref 12.0–15.0)
Immature Granulocytes: 1 %
Lymphocytes Relative: 24 %
Lymphs Abs: 1.6 10*3/uL (ref 0.7–4.0)
MCH: 29.9 pg (ref 26.0–34.0)
MCHC: 32.7 g/dL (ref 30.0–36.0)
MCV: 91.4 fL (ref 80.0–100.0)
Monocytes Absolute: 0.6 10*3/uL (ref 0.1–1.0)
Monocytes Relative: 9 %
Neutro Abs: 4.3 10*3/uL (ref 1.7–7.7)
Neutrophils Relative %: 64 %
Platelets: 193 10*3/uL (ref 150–400)
RBC: 3.38 MIL/uL — ABNORMAL LOW (ref 3.87–5.11)
RDW: 17 % — ABNORMAL HIGH (ref 11.5–15.5)
WBC: 6.6 10*3/uL (ref 4.0–10.5)
nRBC: 0 % (ref 0.0–0.2)

## 2023-08-23 LAB — BASIC METABOLIC PANEL
Anion gap: 9 (ref 5–15)
BUN: 18 mg/dL (ref 6–20)
CO2: 20 mmol/L — ABNORMAL LOW (ref 22–32)
Calcium: 8 mg/dL — ABNORMAL LOW (ref 8.9–10.3)
Chloride: 97 mmol/L — ABNORMAL LOW (ref 98–111)
Creatinine, Ser: 1.7 mg/dL — ABNORMAL HIGH (ref 0.44–1.00)
GFR, Estimated: 35 mL/min — ABNORMAL LOW (ref >60)
Glucose, Bld: 92 mg/dL (ref 70–99)
Potassium: 3.7 mmol/L (ref 3.5–5.1)
Sodium: 126 mmol/L — ABNORMAL LOW (ref 135–145)

## 2023-08-23 LAB — ECHOCARDIOGRAM COMPLETE
AR max vel: 1.48 cm2
AV Area VTI: 1.64 cm2
AV Area mean vel: 1.4 cm2
AV Mean grad: 4.3 mm[Hg]
AV Peak grad: 8.8 mm[Hg]
AV Vena cont: 0.7 cm
Ao pk vel: 1.49 m/s
Area-P 1/2: 3.72 cm2
MV M vel: 3.78 m/s
MV Peak grad: 57.2 mm[Hg]
S' Lateral: 3.4 cm
Weight: 2783.09 [oz_av]

## 2023-08-23 LAB — GLUCOSE, CAPILLARY
Glucose-Capillary: 104 mg/dL — ABNORMAL HIGH (ref 70–99)
Glucose-Capillary: 139 mg/dL — ABNORMAL HIGH (ref 70–99)
Glucose-Capillary: 161 mg/dL — ABNORMAL HIGH (ref 70–99)
Glucose-Capillary: 184 mg/dL — ABNORMAL HIGH (ref 70–99)
Glucose-Capillary: 291 mg/dL — ABNORMAL HIGH (ref 70–99)
Glucose-Capillary: 303 mg/dL — ABNORMAL HIGH (ref 70–99)
Glucose-Capillary: 93 mg/dL (ref 70–99)
Glucose-Capillary: 93 mg/dL (ref 70–99)
Glucose-Capillary: 93 mg/dL (ref 70–99)
Glucose-Capillary: 96 mg/dL (ref 70–99)

## 2023-08-23 LAB — URINALYSIS, W/ REFLEX TO CULTURE (INFECTION SUSPECTED)
Bilirubin Urine: NEGATIVE
Glucose, UA: NEGATIVE mg/dL
Hgb urine dipstick: NEGATIVE
Ketones, ur: NEGATIVE mg/dL
Nitrite: NEGATIVE
Protein, ur: 100 mg/dL — AB
Specific Gravity, Urine: 1.008 (ref 1.005–1.030)
pH: 5 (ref 5.0–8.0)

## 2023-08-23 MED ORDER — IPRATROPIUM-ALBUTEROL 0.5-2.5 (3) MG/3ML IN SOLN: 3.0000 mL | Freq: Four times a day (QID) | RESPIRATORY_TRACT | Status: DC | PRN

## 2023-08-23 MED ORDER — PROPRANOLOL HCL ER 60 MG PO CP24
60.0000 mg | ORAL_CAPSULE | Freq: Every day | ORAL | Status: DC
  Administered 2023-08-23 – 2023-08-24 (×2): 60 mg via ORAL
  Filled 2023-08-23 (×2): qty 1

## 2023-08-23 MED ORDER — GERHARDT'S BUTT CREAM
TOPICAL_CREAM | Freq: Three times a day (TID) | CUTANEOUS | Status: DC
  Administered 2023-09-02: 1 via TOPICAL
  Filled 2023-08-23: qty 60

## 2023-08-23 MED ORDER — EMPAGLIFLOZIN 25 MG PO TABS
25.0000 mg | ORAL_TABLET | Freq: Every day | ORAL | Status: DC
  Filled 2023-08-23: qty 1

## 2023-08-23 MED ORDER — POTASSIUM CHLORIDE CRYS ER 20 MEQ PO TBCR
40.0000 meq | EXTENDED_RELEASE_TABLET | Freq: Once | ORAL | Status: AC
  Administered 2023-08-23: 40 meq via ORAL
  Filled 2023-08-23: qty 2

## 2023-08-23 MED ORDER — IPRATROPIUM-ALBUTEROL 0.5-2.5 (3) MG/3ML IN SOLN: 3.0000 mL | Freq: Two times a day (BID) | RESPIRATORY_TRACT | Status: DC

## 2023-08-23 NOTE — Progress Notes (Addendum)
Progress Note   Patient: [name]:[id number] DOB: Sep 01, 1970 DOA: 08/21/2023     2 DOS: the patient was seen and examined on 08/23/2023   Brief hospital course: [name] was admitted with the working diagnosis of heart failure exacerbation.   53 yo female with the past medical history of heart failure, coronary artery disease, T2DM, hypertension, bilateral BKA and CKD stage 3 who presented with progressive dyspnea, abdominal distention, and lower extremity edema. She was evaluated at [facility] ED, she was diagnosed with volume overload and was transferred to [facility] for further treatment and diagnosis.  At the time of her transfer she was somnolent, blood pressure was 94/61, HR 80, RR 12 and 02 saturation 93% on 3 L/min per [place].  Ill looking appearing, somnolent but able to arouse, lungs with decreased breath sounds bilaterally, heart with S1 and S2 present and regular, abdomen with distention but no organomegaly, bilateral BKA with trace peripheral edema.   ABG 7,36/ 38/ 89/ 21/ 98% Na 130, K 4,9 Cl 98, bicarbonate 22, glucose 109 bun 18 cr 1,79 Mg 1.6  AST 28 ALT 13  BNP >4,500  Wbc 7.1 hgb 10,8 plt 213 TSH 4,7   Chest radiograph with cardiomegaly, bilateral hilar vascular congestion, cephalization of the vasculature, bilateral pleural effusions.   EKG 73 bpm, right axis, normal intervals, sinus rhythm with PAC, with no significant ST segment or T wave changes.   01/26 patient continue very lethargic, she continue to have hypoglycemia. This morning having acute respiratory and metabolic acidosis.  Starting Bipap and checking lactate.  01/27 no lactic acidosis, respiratory acidosis  has improved. Patient with persistent hypoglycemia, required D5 and then D10 infusion.   Assessment and Plan: * Acute on chronic diastolic CHF (congestive heart failure) (HCC) 2021 echocardiogram with mild reduction in LV systolic function 45 to 50% EF, no significant valvular disease.    Documented urine output is 800 cc  Systolic blood pressure 130 mmHg  Continue furosemide to 60 mg IV bid Limited therapy due to risk of hypotension, will hold on B blocker and entresto. Continue to hold on amlodipine.  Hold on SGLT 2 inh due to recovering from severe metabolic and respiratory acidosis.  Follow up on new echocardiogram,   Acute hypoxemic and hypercapnic respiratory failure due to cardiogenic pulmonary edema.  Improved ventilation with Bipap (pressure control), her last VBG 7.37/ 40/ 56/ 23/ 88.4% Will try to wean to [place] during the day and continue Bipap at night.   Type 2 diabetes mellitus (HCC) Hypoglycemia, with acute metabolic encephalopathy.   Patient was on basal insulin and sliding scale.  She was required D5 and then D10  Her fasting glucose this am is 92.   Continue with hypoglycemia protocol  Hold all insulin,  Decreased D10 back to 50 ml; per hr   Diabetic neuropathy, hold on gabapentin and tomopax, due to altered mental status.   Hypertension Continue blood pressure monitoring, holding on entresto and amlodipine.  Continue diuresis   CKD (chronic kidney disease) stage 3, GFR 30-59 ml/min (HCC) CKD stage 3b. (Base serum cr 1,5) Hyponatremia.   Today renal function with serum cr at 1.70 with K at 3,7 and serum bicarbonate at 20  Na 126   Plan to continue diuresis for volume overload.  Follow up renal function in am,   COPD (chronic obstructive pulmonary disease) (HCC) No signs of acute exacerbation, continue bronchodilator therapy.   Obesity (BMI 30-39.9) Not able to calculate BMI Patient with bilateral  BKA.  Poor mobility.        Subjective: Patient with improvement in mentation, no chest pain, dyspnea has improved.   Physical Exam: Vitals:   08/23/23 0115 08/23/23 0402 08/23/23 0615 08/23/23 0647  BP: 132/71 126/81    Pulse:  82    Resp: 19 15    Temp: 97.6 F (36.4 C) 97.6 F (36.4 C) 98.3 F (36.8 C) 98.3 F (36.8 C)   TempSrc: Axillary Axillary    SpO2: 100% 98%    Weight:  78.9 kg     Neurology awake and alert ENT with mild pallor  Cardiovascular with S1 and S2 present and regular, positive systolic murmur at the apex.  No JVD Respiratory with mild rales with no wheezing, scattered rhonchi Abdomen with no distention  Positive thigh edema pitting ++  Bilateral BKA  Data Reviewed:    Family Communication: no family at the bedside   Disposition: Status is: Inpatient Remains inpatient appropriate because: IV diuresis   Planned Discharge Destination: Home      Author: Coralie Keens, MD 08/23/2023 7:41 AM  For on call review [email or web address].

## 2023-08-23 NOTE — Plan of Care (Signed)
  Problem: Education: Goal: Knowledge of General Education information will improve Description: Including pain rating scale, medication(s)/side effects and non-pharmacologic comfort measures Outcome: Progressing   Problem: Clinical Measurements: Goal: Ability to maintain clinical measurements within normal limits will improve Outcome: Progressing Goal: Will remain free from infection Outcome: Progressing Goal: Diagnostic test results will improve Outcome: Progressing Goal: Respiratory complications will improve Outcome: Progressing Goal: Cardiovascular complication will be avoided Outcome: Progressing   Problem: Activity: Goal: Risk for activity intolerance will decrease Outcome: Progressing   Problem: Nutrition: Goal: Adequate nutrition will be maintained Outcome: Progressing   Problem: Coping: Goal: Level of anxiety will decrease Outcome: Progressing   Problem: Elimination: Goal: Will not experience complications related to bowel motility Outcome: Progressing Goal: Will not experience complications related to urinary retention Outcome: Progressing   Problem: Pain Managment: Goal: General experience of comfort will improve and/or be controlled Outcome: Progressing   Problem: Safety: Goal: Ability to remain free from injury will improve Outcome: Progressing

## 2023-08-23 NOTE — Plan of Care (Signed)

## 2023-08-23 NOTE — Progress Notes (Signed)
Pt now A&Ox4, on 2L [place]. 650 clear yellow urine output after in & out cath performed at [facility]. Patient swallowing thin liquid & took meds whole this AM with no difficulty.

## 2023-08-23 NOTE — Progress Notes (Signed)
Unable to complete admission questions d/t continuous bipap & mentation

## 2023-08-23 NOTE — Progress Notes (Signed)
Pt has PRN BIPAP orders, no distress noted at this time.

## 2023-08-23 NOTE — Assessment & Plan Note (Signed)
Continue blood pressure monitoring, holding on entresto and amlodipine.  Continue with metoprolol.  Continue diuresis

## 2023-08-23 NOTE — Progress Notes (Signed)
*  PRELIMINARY RESULTS* Echocardiogram 2D Echocardiogram has been performed.  [name] 08/23/2023, 3:29 PM

## 2023-08-23 NOTE — Progress Notes (Signed)
Heart Failure Stewardship Pharmacist Progress Note   PCP: Royann Shivers, PA-C PCP-Cardiologist: Nona Dell, MD    HPI:  53 yo F with PMH of HFmrEF, frequent PVCs, severe PAD s/p bilateral AKA, tobacco use disorder, hyperlipidemia, anxiety, depression, COPD, hx of PE, type 2 diabetes. History of ECHO with EF 40% in 07/2021, improved to 55% in 07/2022. Last ECHO on 04/2023 with EF 45-50%, elevated LVEDP, normal RV function, small pericardial effusion, mild MR, mild aortic valve regurgitation. Has followed with EP as outpatient for PVCs, PACs, brief episodes of NSVT on long-term monitor. Felt not to be a candidate for ablation and her metoprolol was transitioned to propranolol.   Patient presented to [facility] ED on 1/24 with worsening SOB, abdominal distention, and LEE. Chest x-ray showed bilateral pleural effusions, BNP>7000. She was transferred to [facility] on 1/25. Once at [facility], BNP >4500. Patient somnolent with worsening wheezing on exam, noted she was given multiple doses of morphine in [place]. Narcan administered at [facility]. Also noted to be hypoglycemic, started on dextrose. Found to have acute respiratory and metabolic acidosis and was started on BiPAP. Initiated diuresis with IV lasix, home GDMT held due to hypotension and acute respiratory and metabolic acidosis. New ECHO pending. May need cardiac cath pending ECHO results.  Patient reports feeling okay today. She was sitting up eating lunch. BG is improving. Endorses some SOB, currently on 2 L O2, but not on O2 at home. Patient feels that her upper legs are swollen. Endorses some dizziness/lightheadedness. Patient reports that London Pepper and Sherryll Burger were stopped during last hospitalization at [facility], although both of these were filled on 08/04/23. Per care everywhere, it does look like Sherryll Burger was temporarily stopped due to acute renal failure during recent hospitalization 06/2023 at [facility]. Patient also with hypoglycemia during last  hospitalization.  Current HF Medications: Diuretic: furosemide 60 mg IV BID  Prior to admission HF Medications: Beta blocker: propranolol ER 160 mg daily ACE/ARB/ARNI: Entresto 24/26 mg BID SGLT2i: Jardiance 25 mg daily  Pertinent Lab Values: Serum creatinine 1.70, BUN 18, Potassium 3.7, Sodium 126, BNP >4500 1/26: Magnesium 2.3 1/25: A1c 7.6  Vital Signs: Weight: 173 lbs (admission weight: 164 lbs) Blood pressure: 120/60-80s  Heart rate: 80s  I/O: net +0.4 L yesterday; net +0.4L since admission  Medication Assistance / Insurance Benefits Check: Does the patient have prescription insurance?  Yes Type of insurance plan: Medicare + Medicaid  Outpatient Pharmacy:  Prior to admission outpatient pharmacy: [facility] Is the patient willing to use [facility] TOC pharmacy at discharge? Yes Is the patient willing to transition their outpatient pharmacy to utilize a [facility] outpatient pharmacy?   No    Assessment: 1. Acute on chronic HFmrEF (LVEF 45-50%), due to unclear etiology. New ECHO pending. NYHA class II symptoms. - Continue furosemide 60 mg IV BID. Patient with continued signs of volume overload including SOB, currently on oxygen, orthopnea, and abdominal bloating.  - Continue to hold off on ARNI and BB given lower BP and dizziness/lightheadedness - Continue to hold Jardiance given hypoglycemia and recovering from metabolic and respiratory acidosis - Daily weights, strict I/Os, keep K>4 and Mg>2   Plan: 1) Medication changes recommended at this time: - No changes at this time  2) Patient assistance: - Patient has Medicare + Medicaid - Entresto/Jardiance $0  3)  Education  - Initial education completed, full education to be completed prior to discharge   Jarrett Ables, PharmD PGY-1 Pharmacy Resident   Sharen Hones, PharmD, BCPS Heart  Failure Engineer, building services Phone [phone]

## 2023-08-23 NOTE — Consult Note (Addendum)
Cardiology Consultation   Patient ID: [name] MRN: [id number]; DOB: [birth year]  Admit date: 08/21/2023 Date of Consult: 08/23/2023  PCP:  [name]   [facility] Providers Cardiologist:  Nona Dell, MD  Electrophysiologist:  Maurice Small, MD     Patient Profile:   [name] is a 53 y.o. female with a hx of chronic HFmrEF, frequent PVCs, severe PAD s/p bilateral AKA, ongoing tobacco use, HLD, anxiety, depression, COPD, history of PE, CKD, type 2 diabetes who is being seen 08/23/2023 for the evaluation of CHF at the request of Dr. Ella Jubilee.  History of Present Illness:   [name] is a 53 year old female with past medical history who is followed by Dr. Diona Browner and Dr. Nelly Laurence.  Patient is followed by vascular surgery for history of early onset peripheral vascular occlusive disease.  Previously had multiple bilateral procedures when living in [place].  She is now s/p bilateral AKA.  She established care with Dr. Nelly Laurence in 01/2022 for evaluation of palpitations.  EKG showed sinus rhythm with frequent PACs and PVCs.  Underwent echocardiogram on 02/03/2022 that showed EF 40% with global hypokinesis, normal RV systolic function, mild-moderate mitral valve regurgitation.  Wore a long-term monitor in 01/2022 that showed predominantly normal sinus rhythm with heart rate ranging from 83-1 1 6  bpm, rare PACs, frequent PVCs with 15% burden.  There were also multiple brief episodes of NSVT noted, longest lasted 7 beats.  Patient was transition from carvedilol to metoprolol.  Remained on Jardiance.  Repeat echocardiogram in 06/2022 showed EF 50-55% with no regional wall motion abnormalities, grade 1 DD, normal RV function, mild MR, mild-moderate AI.  In 07/2022, patient complained of intermittent and chronic chest pain.  Underwent nuclear stress test on 09/15/2022 that showed no evidence of ischemia or infarction with EF estimated at 55%.  Overall,  study was normal and low risk.  However, noted to have frequent PVCs.  Repeated a cardiac monitor in 09/2022 that showed predominantly normal sinus rhythm with rare PACs, frequent PVCs with 23.7% burden.  There were also several episodes of ventricular bigeminy and trigeminy.  Patient was referred to EP and was seen by Dr. Nelly Laurence.  It was felt that patient would not be a candidate for ablation.  Transitioned from metoprolol to propranolol.  Repeat echocardiogram in 04/2023 showed EF 45-50%, no regional wall motion abnormalities, normal RV function, mild MR, mild AI.  Patient presented to the [facility] ER complaining of progressive worsening shortness of breath, abdominal distention, and edema in her extremities.  Patient was a poor historian on arrival to the ED, could not confirm if she had been compliant with her medications/diuretics.  In the ED, chest x-ray showed bilateral pleural effusions, BNP elevated to greater than 7000.  Patient was admitted to [facility] on the internal medicine service on 08/21/2023.  Once at [facility], labs significant for BNP >4500, creatinine 1.79, potassium 4.9, hemoglobin 10.8.   On 1/26, it was noted that patient appeared slightly more lethargic and had worsening wheezing on exam.  Patient had received several doses of morphine at [facility] prior to transfer, was given a dose of Narcan.  It was also noted that patient had hypoglycemia.  Chest x-ray on 1/26 showed bibasilar atelectasis/infiltrate with small-moderate bilateral pleural effusions.  Found to have acute respiratory and metabolic acidosis, was started on BiPAP.  Her blood pressure was low so home beta-blocker, Entresto, and amlodipine were held.  Started  on IV Lasix 60 mg twice daily.  Due to her high risk for cardiogenic shock, patient was upgraded to progressive level of care.  Cardiology consulted  This AM, patient is alert and oriented, but is slow to answer questions. On 2 L via [place].  Reports that when she first presented to [facility], her main compliant was abdominal distention/bloating. She also had shortness of breath, orthopnea, swelling in her extremities, and a productive cough. Symptoms had been progressive for about 2 weeks, if not longer. Also reports that she has been having frequent episodes of chest pain, though she is unable to describe her symptoms to me at this time. No nausea, vomiting, diarrhea, fever. Today, patient denies chest pain. Continues to feel short of breath. Has occasional episodes of dizziness/lightheadedness.   Past Medical History:  Diagnosis Date   Anemia 02/05/2012   Anxiety    Arthritis    Asthma    B12 deficiency    CKD (chronic kidney disease) stage 3, GFR 30-59 ml/min (HCC) 09/25/2021   COPD (chronic obstructive pulmonary disease) (HCC)    Depression    Diabetes (HCC)    Elevated LFTs    Essential hypertension    Fatty liver    Folate deficiency    GERD (gastroesophageal reflux disease)    Headache(784.0)    Heart failure (HCC)    PAD (peripheral artery disease) (HCC)    Status post bilateral AKA   PE (pulmonary embolism)    Xarelto   Pneumonia 2013   PVC (premature ventricular contraction)    Sepsis (HCC)    Type 2 diabetes mellitus (HCC)    Vitamin D deficiency     Past Surgical History:  Procedure Laterality Date   ABDOMINAL AORTOGRAM W/LOWER EXTREMITY N/A 09/05/2021   Procedure: ABDOMINAL AORTOGRAM W/LOWER EXTREMITY;  Surgeon: [name], MD;  Location: [facility] INVASIVE CV LAB;  Service: Cardiovascular;  Laterality: N/A;   AMPUTATION Right 09/24/2021   Procedure: RIGHT ABOVE KNEE AMPUTATION;  Surgeon: [name], MD;  Location: [facility] OR;  Service: Vascular;  Laterality: Right;   APPENDECTOMY  2013   CHOLECYSTECTOMY     DIAGNOSTIC LAPAROSCOPY     ESOPHAGEAL DILATION N/A 10/09/2014   Procedure: ESOPHAGEAL DILATION;  Surgeon: West Bali, MD;  Location: [facility] ORS;  Service: Endoscopy;  Laterality: N/A;  #15  and #16 savory   ESOPHAGOGASTRODUODENOSCOPY (EGD) WITH PROPOFOL N/A 10/09/2014   Dr. Darrick Penna: mild erosive gastritis, Savary dilation. Nausea and vomiting most likely due to uncontroleld blood sugars, reflux, and gastritis.      Home Medications:  Prior to Admission medications   Medication Sig Start Date End Date Taking? Authorizing Provider  albuterol (PROVENTIL) (2.5 MG/3ML) 0.083% nebulizer solution Take 2.5 mg by nebulization every 4 (four) hours as needed for wheezing or shortness of breath. 08/19/23   [provider]  ALPRAZolam Prudy Feeler) 0.5 MG tablet Take 1 tablet (0.5 mg total) by mouth 3 (three) times daily as needed for anxiety. 10/02/21   Burnadette Pop, MD  amLODipine (NORVASC) 2.5 MG tablet Take 2.5 mg by mouth daily. 04/15/23   [provider]  aspirin EC 81 MG tablet Take 81 mg by mouth daily. Swallow whole.    [provider]  clopidogrel (PLAVIX) 75 MG tablet Take 75 mg by mouth daily.    [provider]  docusate sodium (COLACE) 100 MG capsule Take 100 mg by mouth as needed.    [provider]  DULoxetine (CYMBALTA) 30 MG capsule Take 30  mg by mouth daily. 06/10/23   [provider]  ENTRESTO 24-26 MG TAKE 1 TABLET BY MOUTH TWICE DAILY 03/17/23   Jonelle Sidle, MD  escitalopram (LEXAPRO) 20 MG tablet Take 20 mg by mouth daily. 08/04/23   [provider]  gabapentin (NEURONTIN) 300 MG capsule Take 300 mg by mouth 3 (three) times daily. 08/15/23   [provider]  gabapentin (NEURONTIN) 400 MG capsule Take 400 mg by mouth 4 (four) times daily.    [provider]  insulin glargine (LANTUS) 100 unit/mL SOPN Inject 40 Units into the skin 2 (two) times daily.    [provider]  JARDIANCE 25 MG TABS tablet Take 25 mg by mouth daily. 06/15/22   [provider]  linaclotide (LINZESS) 72 MCG capsule Take 72 mcg by mouth as needed.    [provider]  methocarbamol (ROBAXIN) 500 MG  tablet Take 500 mg by mouth 2 (two) times daily. 05/05/21   [provider]  ondansetron (ZOFRAN) 4 MG tablet Take 4 mg by mouth every 6 (six) hours as needed for nausea or vomiting.    [provider]  OZEMPIC, 0.25 OR 0.5 MG/DOSE, 2 MG/3ML SOPN Inject 0.5 mg into the skin once a week. 07/31/22   [provider]  pantoprazole (PROTONIX) 40 MG tablet Take 1 tablet (40 mg total) by mouth daily. 10/02/21   Burnadette Pop, MD  polyethylene glycol powder (GLYCOLAX/MIRALAX) 17 GM/SCOOP powder Take one capful in 6 ounces of liquid twice daily until soft stool, then continue once daily. 03/30/23   Tiffany Kocher, PA-C  propranolol ER (INDERAL LA) 120 MG 24 hr capsule Take 120 mg by mouth daily. 08/04/23   [provider]  propranolol ER (INDERAL LA) 160 MG SR capsule Take 1 capsule (160 mg total) by mouth daily. 06/21/23   Jonelle Sidle, MD  rosuvastatin (CRESTOR) 40 MG tablet Take 40 mg by mouth daily. 05/18/23   [provider]  topiramate (TOPAMAX) 50 MG tablet Take 50 mg by mouth 2 (two) times daily. 07/14/22   [provider]  traMADol (ULTRAM) 50 MG tablet Take 50 mg by mouth daily. 08/19/23   [provider]  VENTOLIN HFA 108 (90 Base) MCG/ACT inhaler SMARTSIG:2 Puff(s) By Mouth PRN 06/10/23   [provider]  vitamin B-12 1000 MCG tablet Take 1 tablet (1,000 mcg total) by mouth daily. 10/03/21   Burnadette Pop, MD    Inpatient Medications: Scheduled Meds:  aspirin EC  81 mg Oral Daily   clopidogrel  75 mg Oral Daily   cyanocobalamin  1,000 mcg Oral Daily   furosemide  60 mg Intravenous Q12H   heparin  5,000 Units Subcutaneous Q8H   ipratropium-albuterol  3 mL Nebulization Q6H   pantoprazole  40 mg Oral Daily   rosuvastatin  40 mg Oral Daily   sodium chloride flush  3 mL Intravenous Q12H   Continuous Infusions:  dextrose 50 mL/hr at 08/23/23 0750   PRN Meds: acetaminophen, dextrose, docusate sodium, naLOXone (NARCAN)   injection, polyethylene glycol, sodium chloride flush  Allergies:   No Known Allergies  Social History:   Social History   Socioeconomic History   Marital status: Single    Spouse name: Not on file   Number of children: Not on file   Years of education: 12   Highest education level: 12th grade  Occupational History   Not on file  Tobacco Use   Smoking status: Every Day  Current packs/day: 1.00    Average packs/day: 1 pack/day for 20.0 years (20.0 ttl pk-yrs)    Types: Cigarettes    Passive exposure: Current   Smokeless tobacco: Never  Vaping Use   Vaping status: Former  Substance and Sexual Activity   Alcohol use: No   Drug use: No   Sexual activity: Yes    Birth control/protection: Injection  Other Topics Concern   Not on file  Social History Narrative   Not on file   Social Drivers of Health   Financial Resource Strain: Low Risk  (07/23/2023)   Received from [facility]   Overall Financial Resource Strain (CARDIA)    Difficulty of Paying Living Expenses: Not very hard  Food Insecurity: No Food Insecurity (07/23/2023)   Received from [facility]   Hunger Vital Sign    Worried About Running Out of Food in the Last Year: Never true    Ran Out of Food in the Last Year: Never true  Transportation Needs: No Transportation Needs (08/04/2023)   Received from [facility]   OASIS A1250: Transportation    Lack of Transportation (Medical): No    Lack of Transportation (Non-Medical): No    Patient Unable or Declines to Respond: No  Physical Activity: Inactive (07/23/2023)   Received from [facility]   Exercise Vital Sign    Days of Exercise per Week: 0 days    Minutes of Exercise per Session: 0 min  Stress: No Stress Concern Present (07/23/2023)   Received from [facility] - Occupational Stress Questionnaire    Feeling of Stress : Not at all  Social Connections: Socially Isolated (07/23/2023)   Received  from [name]   Social Connection and Isolation Panel [NHANES]    Frequency of Communication with Friends and Family: Three times a week    Frequency of Social Gatherings with Friends and Family: Three times a week    Attends Religious Services: Never    Active Member of Clubs or Organizations: No    Attends Banker Meetings: Never    Marital Status: Separated  Intimate Partner Violence: Not At Risk (07/23/2023)   Received from [facility]   Humiliation, Afraid, Rape, and Kick questionnaire    Fear of Current or Ex-Partner: No    Emotionally Abused: No    Physically Abused: No    Sexually Abused: No    Family History:    Family History  Problem Relation Age of Onset   Breast cancer Mother    Kidney disease Sister    Diabetes Sister    Breast cancer Sister    Diabetes Brother    Colon cancer Neg Hx    Liver disease Neg Hx      ROS:  Please see the history of present illness.   All other ROS reviewed and negative.     Physical Exam/Data:   Vitals:   08/23/23 0115 08/23/23 0402 08/23/23 0615 08/23/23 0647  BP: 132/71 126/81    Pulse:  82    Resp: 19 15    Temp: 97.6 F (36.4 C) 97.6 F (36.4 C) 98.3 F (36.8 C) 98.3 F (36.8 C)  TempSrc: Axillary Axillary    SpO2: 100% 98%    Weight:  78.9 kg      Intake/Output Summary (Last 24 hours) at 08/23/2023 0755 Last data filed at 08/23/2023 0648 Gross per 24 hour  Intake 413.53 ml  Output  800 ml  Net -386.47 ml      08/23/2023    4:02 AM 08/22/2023    2:21 AM 08/21/2023    9:28 PM  Last 3 Weights  Weight (lbs) 173 lb 15.1 oz 164 lb 7.4 oz 164 lb 7.4 oz  Weight (kg) 78.9 kg 74.6 kg 74.6 kg     Body mass index is 28.08 kg/m.  General:  Patient appears older than stated age. Sitting upright in the bed, no acute distress. Wearing [place]  HEENT: normal Neck: no JVD Vascular: Radial pulses 2+ bilaterally Cardiac:  normal S1, S2; RRR; faint systolic murmur at RUSB  Lungs:  crackles in bilateral  lung bases, expiratory wheezes throughout. Normal WOB on   Abd: soft, mildly tender to palpation throughout. Distended  Musculoskeletal: S/p bilateral AKAs  Skin: warm and dry  Neuro:   no focal abnormalities noted Psych:  Normal affect   EKG:  The EKG was personally reviewed and demonstrates:  EKGs this admission have shown sinus rhythm with frequent PVCs,  Telemetry:  Telemetry was personally reviewed and demonstrates:  NSR with PVCs, ventricular bigeminy and trigeminy   Relevant CV Studies Cardiac Studies & Procedures     STRESS TESTS  NM MYOCAR MULTI W/SPECT W 09/15/2022  Narrative   Baseline EKG showed normal sinus rhythm and frequent PVCs. Stress ECG is negative for ischemia but has frequent PVCs in a pattern of ventricular quadrigeminy and trigeminy.   LV perfusion is normal. There is no evidence of ischemia. There is no evidence of infarction.   Left ventricular function is normal. Nuclear stress EF: 55 %.   The study is normal. The study is low risk.  ECHOCARDIOGRAM  ECHOCARDIOGRAM COMPLETE 05/20/2023  Narrative ECHOCARDIOGRAM REPORT    Patient Name:   [name] Date of Exam: 05/20/2023 Medical Rec #:  [id number]       Height:       66.0 in Accession #:    [id number]      Weight:       191.0 lb Date of Birth:  05-16-1971       BSA:          1.961 m Patient Age:    52 years        BP:           118/68 mmHg Patient Gender: F               HR:           90 bpm. Exam Location:  [place]  Procedure: 2D Echo  Indications:    R00.2 Palpitations  History:        Patient has prior history of Echocardiogram examinations, most recent 07/16/2022. CHF, CAD, PAD, COPD and Chronic kidney disease, Arrythmias:PVC and PAC; Risk Factors:Current Smoker, Hypertension, Diabetes and Dyslipidemia.  Sonographer:    Dominica Severin RCS, RVS Referring Phys: [id number] Maurice Small   Sonographer Comments: Global longitudinal strain was attempted. IMPRESSIONS   1. Left  ventricular ejection fraction, by estimation, is 45 to 50%. The left ventricle has mildly decreased function. The left ventricle has no regional wall motion abnormalities. Left ventricular diastolic parameters are indeterminate. Elevated left ventricular end-diastolic pressure. 2. Right ventricular systolic function is normal. The right ventricular size is normal. Tricuspid regurgitation signal is inadequate for assessing PA pressure. 3. A small pericardial effusion is present. The pericardial effusion is anterior to the right ventricle and localized near the right atrium. 4. The mitral valve is normal  in structure. Mild mitral valve regurgitation. No evidence of mitral stenosis. 5. The aortic valve has an indeterminant number of cusps. Aortic valve regurgitation is mild. No aortic stenosis is present. 6. The inferior vena cava is normal in size with greater than 50% respiratory variability, suggesting right atrial pressure of 3 mmHg.  Comparison(s): Changes from prior study are noted. Stable LVEF, 45-50% now compared to 50-55% previously.  FINDINGS Left Ventricle: Left ventricular ejection fraction, by estimation, is 45 to 50%. The left ventricle has mildly decreased function. The left ventricle has no regional wall motion abnormalities. The left ventricular internal cavity size was normal in size. There is no left ventricular hypertrophy. Left ventricular diastolic parameters are indeterminate. Elevated left ventricular end-diastolic pressure.  Right Ventricle: The right ventricular size is normal. No increase in right ventricular wall thickness. Right ventricular systolic function is normal. Tricuspid regurgitation signal is inadequate for assessing PA pressure.  Left Atrium: Left atrial size was normal in size.  Right Atrium: Right atrial size was normal in size.  Pericardium: A small pericardial effusion is present. The pericardial effusion is anterior to the right ventricle and localized  near the right atrium.  Mitral Valve: The mitral valve is normal in structure. Mild mitral valve regurgitation. No evidence of mitral valve stenosis. MV peak gradient, 7.6 mmHg. The mean mitral valve gradient is 3.0 mmHg.  Tricuspid Valve: The tricuspid valve is grossly normal. Tricuspid valve regurgitation is not demonstrated. No evidence of tricuspid stenosis.  Aortic Valve: The aortic valve has an indeterminant number of cusps. Aortic valve regurgitation is mild. Aortic regurgitation PHT measures 388 msec. No aortic stenosis is present. Aortic valve mean gradient measures 6.0 mmHg. Aortic valve peak gradient measures 16.8 mmHg. Aortic valve area, by VTI measures 1.37 cm.  Pulmonic Valve: The pulmonic valve was not well visualized. Pulmonic valve regurgitation is trivial. No evidence of pulmonic stenosis.  Aorta: The aortic root and ascending aorta are structurally normal, with no evidence of dilitation.  Venous: The inferior vena cava is normal in size with greater than 50% respiratory variability, suggesting right atrial pressure of 3 mmHg.  IAS/Shunts: No atrial level shunt detected by color flow Doppler.   LEFT VENTRICLE PLAX 2D LVIDd:         5.30 cm      Diastology LVIDs:         4.10 cm      LV e' medial:    5.33 cm/s LV PW:         1.00 cm      LV E/e' medial:  22.1 LV IVS:        1.00 cm      LV e' lateral:   4.68 cm/s LVOT diam:     2.00 cm      LV E/e' lateral: 25.2 LV SV:         46 LV SV Index:   24 LVOT Area:     3.14 cm  LV Volumes (MOD) LV vol d, MOD A2C: 122.0 ml LV vol d, MOD A4C: 55.4 ml LV vol s, MOD A2C: 72.3 ml LV vol s, MOD A4C: 32.7 ml LV SV MOD A2C:     49.7 ml LV SV MOD A4C:     55.4 ml LV SV MOD BP:      34.0 ml  RIGHT VENTRICLE RV Basal diam:  2.50 cm RV Mid diam:    2.20 cm RV S prime:     15.70 cm/s TAPSE (M-mode): 2.7  cm  LEFT ATRIUM             Index        RIGHT ATRIUM           Index LA diam:        4.00 cm 2.04 cm/m   RA Area:      10.70 cm LA Vol (A2C):   58.6 ml 29.88 ml/m  RA Volume:   20.60 ml  10.50 ml/m LA Vol (A4C):   50.5 ml 25.75 ml/m LA Biplane Vol: 54.8 ml 27.94 ml/m AORTIC VALVE                     PULMONIC VALVE AV Area (Vmax):    1.35 cm      PV Vmax:       0.86 m/s AV Area (Vmean):   1.50 cm      PV Peak grad:  3.0 mmHg AV Area (VTI):     1.37 cm AV Vmax:           205.00 cm/s AV Vmean:          113.000 cm/s AV VTI:            0.337 m AV Peak Grad:      16.8 mmHg AV Mean Grad:      6.0 mmHg LVOT Vmax:         88.30 cm/s LVOT Vmean:        54.100 cm/s LVOT VTI:          0.147 m LVOT/AV VTI ratio: 0.44 AI PHT:            388 msec  AORTA Ao Root diam: 2.60 cm Ao Asc diam:  2.90 cm  MITRAL VALVE MV Area (PHT): 3.27 cm     SHUNTS MV Area VTI:   1.19 cm     Systemic VTI:  0.15 m MV Peak grad:  7.6 mmHg     Systemic Diam: 2.00 cm MV Mean grad:  3.0 mmHg MV Vmax:       1.38 m/s MV Vmean:      80.5 cm/s MV Decel Time: 232 msec MR Peak grad: 144.5 mmHg MR Mean grad: 100.0 mmHg MR Vmax:      601.00 cm/s MR Vmean:     483.0 cm/s MV E velocity: 118.00 cm/s MV A velocity: 120.00 cm/s MV E/A ratio:  0.98  Vishnu Priya Mallipeddi Electronically signed by Winfield Rast Mallipeddi Signature Date/Time: 05/20/2023/12:36:52 PM    Final   MONITORS  LONG TERM MONITOR (3-14 DAYS) 10/09/2022  Narrative ZIO monitor reviewed.  7 days, 13 hours analyzed.  Predominant rhythm is sinus with heart rate ranging from 71 bpm up to 225 bpm and average heart rate 84 bpm. There were rare PACs representing less than 1% total beats. Frequent PVCs were noted representing 23.7% total beats with otherwise rare ventricular couplets and triplets.  Several episodes of ventricular bigeminy and trigeminy were also noted. There were no pauses or episodes of high degree heart block.              Laboratory Data:  High Sensitivity Troponin:  No results for input(s): "TROPONINIHS" in the last 720 hours.    Chemistry Recent Labs  Lab 08/21/23 2233 08/21/23 2240 08/22/23 0353 08/23/23 0410  NA 130*  --  130* 126*  K 4.9  --  4.3 3.7  CL 98  --  99 97*  CO2 22  --  22  20*  GLUCOSE 109*  --  53* 92  BUN 18  --  18 18  CREATININE 1.79*  --  1.76* 1.70*  CALCIUM 8.3*  --  8.5* 8.0*  MG  --  1.6* 2.3  --   GFRNONAA 33*  --  34* 35*  ANIONGAP 10  --  9 9    Recent Labs  Lab 08/22/23 0353  PROT 5.7*  ALBUMIN 2.8*  AST 28  ALT 13  ALKPHOS 177*  BILITOT 0.8   Lipids No results for input(s): "CHOL", "TRIG", "HDL", "LABVLDL", "LDLCALC", "CHOLHDL" in the last 168 hours.  Hematology Recent Labs  Lab 08/21/23 2233 08/22/23 0353 08/23/23 0410  WBC 7.1 8.3 6.6  RBC 3.66* 3.89 3.38*  HGB 10.8* 11.5* 10.1*  HCT 34.3* 36.0 30.9*  MCV 93.7 92.5 91.4  MCH 29.5 29.6 29.9  MCHC 31.5 31.9 32.7  RDW 16.9* 17.0* 17.0*  PLT 213 250 193   Thyroid  Recent Labs  Lab 08/22/23 0059  TSH 4.790*    BNP Recent Labs  Lab 08/21/23 2233  BNP >4,500.0*    DDimer No results for input(s): "DDIMER" in the last 168 hours.   Radiology/Studies:  DG Chest Port 1 View Result Date: 08/22/2023 CLINICAL DATA:  Wheezing. EXAM: PORTABLE CHEST 1 VIEW COMPARISON:  08/20/2023 FINDINGS: The cardio pericardial silhouette is enlarged. Bibasilar atelectasis/infiltrate with small to moderate bilateral pleural effusions, similar to prior. Telemetry leads overlie the chest. IMPRESSION: Bibasilar atelectasis/infiltrate with small to moderate bilateral pleural effusions, similar to prior. Electronically Signed   By: Kennith Center M.D.   On: 08/22/2023 08:01     Assessment and Plan:   Acute on Chronic HFmrEF  Mild MR Mild AI -Most recent echocardiogram from 04/2023 showed EF 45-50%, no regional wall motion abnormalities, normal RV function, mild MR, mild AI.  Previous nuclear stress test in 08/2022 showed no evidence of ischemia or infarction. - prior to admission, patient was on Entresto 24-26 mg twice daily,  Jardiance 25 mg daily, propranolol 160 mg daily -Patient presented with worsening shortness of breath and lethargy.  BNP elevated to >4,500. CXR showed bibasilar atelectasis/infiltrate with small to moderate bilateral pleural effusions -Yesterday, patient had increased lethargy, found to be hypoglycemic.  Also noted to have acute respiratory and metabolic acidosis.  Was started on BiPAP -Due to low BP, home beta-blocker and Entresto have been held. BP improving, will likely be able to resume GDMT tomorrow  -Currently on IV Lasix 60 mg twice daily.  Renal function stable. Continues to be volume overloaded on exam. Continue diuresis with strict I/Os, daily weights, daily BMPs  - Ordered repeat echocardiogram this admission  - Patient reports frequent episodes of chest pain prior to admission, but is unable to describe her symptoms to me at this time. Currently chest pain free. With frequent PVCs, reduced EF on echocardiogram from 04/2023, significant history of PAD, tobacco use, diabetes, may need heart catheterization at some point to more definitively assess CAD. Possibly this admission pending echo results, renal function. Will discuss further with MD, may be able to pursue as an outpatient   Frequent PVCs  -Most recent cardiac monitor from 09/2022 showed frequent PVCs with 23.7% burden.  Followed by Dr. Nelly Laurence with EP.  Felt to not be a good candidate for ablation.  Has been on propranolol 160 mg daily -As above, beta-blocker currently held due to low BP -Potassium 3.7 this morning.  Mag was 2.3 yesterday.  Recommend maintaining K >4, mag >2. Ordered  K supplementation this AM  - Per tele- patient is predominantly in NSR with PVCs, bigeminy, trigeminy  - Patient has been referred for outpatient sleep study per EP note 07/02/23  PAD  -Followed by vascular surgery.  Previously had multiple bilateral procedures when living in [place], now S/p bilateral AKAs - Continue ASA, plavix, crestor   Tobacco  Use  - Counseled on tobacco cessation   CKD stage IIIb - Baseline creatinine around 1.5. Creatinine stable this admission around 1.7 - Follow renal function with diuresis   Otherwise per primary  - Type 2 DM, now with hypoglycemia  - COPD  Risk Assessment/Risk Scores:    New York Heart Association (NYHA) Functional Class NYHA Class IV  For questions or updates, please contact [facility] Please consult [email or web address] for contact info under   Signed, Jonita Albee, PA-C  08/23/2023 7:55 AM As above, patient seen and examined.  Briefly she is a 53 year old female with past medical history of chronic combined systolic/diastolic congestive heart failure, frequent PVCs, peripheral vascular disease status post bilateral AKA, tobacco abuse, COPD, prior pulmonary embolus, chronic stage IIIa kidney disease, diabetes mellitus for evaluation of acute on chronic combined systolic/diastolic congestive heart failure.  Nuclear study February 2024 showed ejection fraction 55% and no ischemia or infarction.  Last echocardiogram October 2024 showed ejection fraction 45 to 50%, small pericardial effusion, mild mitral regurgitation, mild aortic insufficiency.  Patient somewhat lethargic at time of my evaluation.  However she states she has had increased abdominal girth, edema in her lower extremities and worsening dyspnea.  She has occasional chest pain lasting 3 minutes at a time not related to activities.  Chest x-ray shows small to moderate bilateral pleural effusions.  Creatinine 1.7, sodium 126, hemoglobin 10.1.  BNP greater than 4500.  Electrocardiogram showed sinus rhythm with frequent PVCs and nonspecific ST changes.  1 acute on chronic combined systolic/diastolic congestive heart failure-will plan to repeat echocardiogram.  She is not markedly volume overloaded on examination.  Will continue Lasix at present dose and resume Jardiance.  Continue to follow renal function closely.  2  cardiomyopathy-LV function mildly reduced on previous echocardiogram.  Previous nuclear study showed no ischemia.  Sherryll Burger has been held due to borderline blood pressure.  Will follow and resume as allowed by renal function and blood pressure.  3 history of frequent PVCs-patient's beta-blocker was changed to Inderal LA by electrophysiology.  4 peripheral vascular disease-continue aspirin and statin.  5 chronic stage IIIa kidney disease-follow renal function with diuresis.  6 hyponatremia-will recheck tomorrow morning.  7 tobacco abuse-patient counseled on discontinuing.  8 chest pain-patient has occasional chest pain lasting 3 minutes not related to activities.  Not clear she would be a good candidate for aggressive cardiac evaluation at this point.  Will consider outpatient stress test.  Olga Millers, MD

## 2023-08-23 NOTE — Progress Notes (Signed)
Heart Failure Navigator Progress Note  Assessed for Heart & Vascular TOC clinic readiness.  Patient per Dr. Ella Jubilee, No HF TOC. .   Navigator will sign off at this time.   Rhae Hammock, BSN, Scientist, clinical (histocompatibility and immunogenetics) Only

## 2023-08-24 ENCOUNTER — Encounter (HOSPITAL_COMMUNITY): Payer: Self-pay | Admitting: Internal Medicine

## 2023-08-24 ENCOUNTER — Inpatient Hospital Stay (HOSPITAL_COMMUNITY): Payer: 59

## 2023-08-24 DIAGNOSIS — I5023 Acute on chronic systolic (congestive) heart failure: Secondary | ICD-10-CM | POA: Diagnosis not present

## 2023-08-24 DIAGNOSIS — N1832 Chronic kidney disease, stage 3b: Secondary | ICD-10-CM | POA: Diagnosis not present

## 2023-08-24 DIAGNOSIS — E1122 Type 2 diabetes mellitus with diabetic chronic kidney disease: Secondary | ICD-10-CM | POA: Diagnosis not present

## 2023-08-24 DIAGNOSIS — I10 Essential (primary) hypertension: Secondary | ICD-10-CM | POA: Diagnosis not present

## 2023-08-24 DIAGNOSIS — I5043 Acute on chronic combined systolic (congestive) and diastolic (congestive) heart failure: Secondary | ICD-10-CM | POA: Diagnosis not present

## 2023-08-24 LAB — CBC WITH DIFFERENTIAL/PLATELET
Abs Immature Granulocytes: 0.02 10*3/uL (ref 0.00–0.07)
Basophils Absolute: 0.1 10*3/uL (ref 0.0–0.1)
Basophils Relative: 1 %
Eosinophils Absolute: 0.1 10*3/uL (ref 0.0–0.5)
Eosinophils Relative: 2 %
HCT: 29.4 % — ABNORMAL LOW (ref 36.0–46.0)
Hemoglobin: 9.5 g/dL — ABNORMAL LOW (ref 12.0–15.0)
Immature Granulocytes: 0 %
Lymphocytes Relative: 33 %
Lymphs Abs: 2 10*3/uL (ref 0.7–4.0)
MCH: 29.8 pg (ref 26.0–34.0)
MCHC: 32.3 g/dL (ref 30.0–36.0)
MCV: 92.2 fL (ref 80.0–100.0)
Monocytes Absolute: 0.6 10*3/uL (ref 0.1–1.0)
Monocytes Relative: 10 %
Neutro Abs: 3.4 10*3/uL (ref 1.7–7.7)
Neutrophils Relative %: 54 %
Platelets: 198 10*3/uL (ref 150–400)
RBC: 3.19 MIL/uL — ABNORMAL LOW (ref 3.87–5.11)
RDW: 17.2 % — ABNORMAL HIGH (ref 11.5–15.5)
WBC: 6.3 10*3/uL (ref 4.0–10.5)
nRBC: 0 % (ref 0.0–0.2)

## 2023-08-24 LAB — GLUCOSE, CAPILLARY
Glucose-Capillary: 281 mg/dL — ABNORMAL HIGH (ref 70–99)
Glucose-Capillary: 195 mg/dL — ABNORMAL HIGH (ref 70–99)
Glucose-Capillary: 211 mg/dL — ABNORMAL HIGH (ref 70–99)
Glucose-Capillary: 192 mg/dL — ABNORMAL HIGH (ref 70–99)

## 2023-08-24 LAB — BASIC METABOLIC PANEL
Anion gap: 9 (ref 5–15)
BUN: 21 mg/dL — ABNORMAL HIGH (ref 6–20)
CO2: 20 mmol/L — ABNORMAL LOW (ref 22–32)
Calcium: 8 mg/dL — ABNORMAL LOW (ref 8.9–10.3)
Chloride: 97 mmol/L — ABNORMAL LOW (ref 98–111)
Creatinine, Ser: 2.03 mg/dL — ABNORMAL HIGH (ref 0.44–1.00)
GFR, Estimated: 29 mL/min — ABNORMAL LOW (ref >60)
Glucose, Bld: 307 mg/dL — ABNORMAL HIGH (ref 70–99)
Potassium: 4.4 mmol/L (ref 3.5–5.1)
Sodium: 126 mmol/L — ABNORMAL LOW (ref 135–145)

## 2023-08-24 MED ORDER — EMPAGLIFLOZIN 10 MG PO TABS
10.0000 mg | ORAL_TABLET | Freq: Every day | ORAL | Status: DC
  Administered 2023-08-24 – 2023-09-03 (×11): 10 mg via ORAL
  Filled 2023-08-24 (×11): qty 1

## 2023-08-24 MED ORDER — INSULIN ASPART 100 UNIT/ML IJ SOLN
0.0000 [IU] | Freq: Three times a day (TID) | INTRAMUSCULAR | Status: DC
  Administered 2023-08-24: 3 [IU] via SUBCUTANEOUS
  Administered 2023-08-24: 2 [IU] via SUBCUTANEOUS
  Administered 2023-08-24: 5 [IU] via SUBCUTANEOUS
  Administered 2023-08-25: 2 [IU] via SUBCUTANEOUS
  Administered 2023-08-25: 3 [IU] via SUBCUTANEOUS
  Administered 2023-08-25: 5 [IU] via SUBCUTANEOUS
  Administered 2023-08-26 (×2): 2 [IU] via SUBCUTANEOUS
  Administered 2023-08-26: 3 [IU] via SUBCUTANEOUS
  Administered 2023-08-27 – 2023-08-28 (×3): 1 [IU] via SUBCUTANEOUS
  Administered 2023-08-29 – 2023-08-30 (×3): 2 [IU] via SUBCUTANEOUS
  Administered 2023-08-30: 5 [IU] via SUBCUTANEOUS
  Administered 2023-08-31: 2 [IU] via SUBCUTANEOUS
  Administered 2023-08-31: 1 [IU] via SUBCUTANEOUS
  Administered 2023-09-01: 2 [IU] via SUBCUTANEOUS

## 2023-08-24 MED ORDER — NICOTINE 21 MG/24HR TD PT24
21.0000 mg | MEDICATED_PATCH | Freq: Every day | TRANSDERMAL | Status: DC
  Administered 2023-08-24 – 2023-09-03 (×11): 21 mg via TRANSDERMAL
  Filled 2023-08-24 (×11): qty 1

## 2023-08-24 MED ORDER — METHOCARBAMOL 500 MG PO TABS
500.0000 mg | ORAL_TABLET | Freq: Two times a day (BID) | ORAL | Status: DC | PRN
  Administered 2023-08-24 – 2023-08-26 (×4): 500 mg via ORAL
  Filled 2023-08-24 (×4): qty 1

## 2023-08-24 MED ORDER — METOPROLOL SUCCINATE ER 25 MG PO TB24
25.0000 mg | ORAL_TABLET | Freq: Every day | ORAL | Status: DC
  Administered 2023-08-24 – 2023-09-03 (×11): 25 mg via ORAL
  Filled 2023-08-24 (×11): qty 1

## 2023-08-24 MED ORDER — TRAMADOL HCL 50 MG PO TABS
50.0000 mg | ORAL_TABLET | Freq: Three times a day (TID) | ORAL | Status: DC | PRN
  Administered 2023-08-24 – 2023-09-02 (×11): 50 mg via ORAL
  Filled 2023-08-24 (×12): qty 1

## 2023-08-24 NOTE — Progress Notes (Addendum)
Rounding Note    Patient Name: [name] Date of Encounter: 08/24/2023  [facility] Cardiologist: Nona Dell, MD   Subjective   No CP; dyspnea improving  Inpatient Medications    Scheduled Meds:  aspirin EC  81 mg Oral Daily   clopidogrel  75 mg Oral Daily   cyanocobalamin  1,000 mcg Oral Daily   furosemide  60 mg Intravenous Q12H   Gerhardt's butt cream   Topical TID   heparin  5,000 Units Subcutaneous Q8H   insulin aspart  0-9 Units Subcutaneous TID WC   nicotine  21 mg Transdermal Daily   pantoprazole  40 mg Oral Daily   propranolol ER  60 mg Oral Daily   rosuvastatin  40 mg Oral Daily   sodium chloride flush  3 mL Intravenous Q12H   Continuous Infusions:  PRN Meds: acetaminophen, dextrose, docusate sodium, ipratropium-albuterol, naLOXone (NARCAN)  injection, polyethylene glycol, sodium chloride flush   Vital Signs    Vitals:   08/24/23 0400 08/24/23 0756 08/24/23 0757 08/24/23 0758  BP:  (!) 111/49 117/60 117/60  Pulse:  69 62   Resp:  15 15 14   Temp:  97.6 F (36.4 C)    TempSrc:  Oral    SpO2: 97% 100% 98% 99%  Weight:      Height:        Intake/Output Summary (Last 24 hours) at 08/24/2023 0951 Last data filed at 08/24/2023 6213 Gross per 24 hour  Intake 1680 ml  Output 950 ml  Net 730 ml      08/24/2023    3:11 AM 08/23/2023    4:02 AM 08/22/2023    2:21 AM  Last 3 Weights  Weight (lbs) 176 lb 2.4 oz 173 lb 15.1 oz 164 lb 7.4 oz  Weight (kg) 79.9 kg 78.9 kg 74.6 kg      Telemetry    Sinus with PVCs - Personally Reviewed   Physical Exam   GEN: No acute distress.   Neck: supple Cardiac: RRR Respiratory: Clear to auscultation bilaterally. GI: Soft, nontender, non-distended  MS: Status post bilateral AKA with edema in her stumps Neuro:  Nonfocal  Psych: Normal affect   Labs     Chemistry Recent Labs  Lab 08/21/23 2240 08/22/23 0353 08/23/23 0410 08/24/23 0431  NA  --  130* 126* 126*  K  --  4.3 3.7 4.4   CL  --  99 97* 97*  CO2  --  22 20* 20*  GLUCOSE  --  53* 92 307*  BUN  --  18 18 21*  CREATININE  --  1.76* 1.70* 2.03*  CALCIUM  --  8.5* 8.0* 8.0*  MG 1.6* 2.3  --   --   PROT  --  5.7*  --   --   ALBUMIN  --  2.8*  --   --   AST  --  28  --   --   ALT  --  13  --   --   ALKPHOS  --  177*  --   --   BILITOT  --  0.8  --   --   GFRNONAA  --  34* 35* 29*  ANIONGAP  --  9 9 9      Hematology Recent Labs  Lab 08/22/23 0353 08/23/23 0410 08/24/23 0431  WBC 8.3 6.6 6.3  RBC 3.89 3.38* 3.19*  HGB 11.5* 10.1* 9.5*  HCT 36.0 30.9* 29.4*  MCV 92.5 91.4 92.2  MCH  29.6 29.9 29.8  MCHC 31.9 32.7 32.3  RDW 17.0* 17.0* 17.2*  PLT 250 193 198   Thyroid  Recent Labs  Lab 08/22/23 0059  TSH 4.790*    BNP Recent Labs  Lab 08/21/23 2233  BNP >4,500.0*      Radiology    ECHOCARDIOGRAM COMPLETE Result Date: 08/23/2023    ECHOCARDIOGRAM REPORT   Patient Name:   [name] Date of Exam: 08/23/2023 Medical Rec #:  [id number]       Height:       66.0 in Accession #:    [id number]      Weight:       173.9 lb Date of Birth:  [birth year]       BSA:          1.885 m Patient Age:    52 years        BP:           116/65 mmHg Patient Gender: F               HR:           36 bpm. Exam Location:  Inpatient Procedure: 2D Echo, Color Doppler and Cardiac Doppler Indications:    CHF I50.21  History:        Patient has prior history of Echocardiogram examinations, most                 recent 05/20/2023. CHF, CKD, COPD and PAD; Risk                 Factors:Diabetes, Hypertension and Current Smoker.  Sonographer:    Dondra Prader RVT RCS Referring Phys: [id number] Jonita Albee IMPRESSIONS  1. Left ventricular ejection fraction, by estimation, is 30 to 35%. The left ventricle has moderately decreased function. The left ventricle has no regional wall motion abnormalities. Left ventricular diastolic function could not be evaluated.  2. Right ventricular systolic function is moderately reduced. The right  ventricular size is normal.  3. Left atrial size was severely dilated.  4. Right atrial size was moderately dilated.  5. The posterior leaflet appears restrcited. There is moderate to severe eccentric MR. The mitral valve is normal in structure. Moderate to severe mitral valve regurgitation. No evidence of mitral stenosis.  6. The aortic valve is tricuspid. There is moderate calcification of the aortic valve. Aortic valve regurgitation is trivial. Mild aortic valve stenosis.  7. The inferior vena cava is dilated in size with >50% respiratory variability, suggesting right atrial pressure of 8 mmHg. FINDINGS  Left Ventricle: Left ventricular ejection fraction, by estimation, is 30 to 35%. The left ventricle has moderately decreased function. The left ventricle has no regional wall motion abnormalities. The left ventricular internal cavity size was normal in size. There is no left ventricular hypertrophy. Left ventricular diastolic function could not be evaluated due to atrial fibrillation. Left ventricular diastolic function could not be evaluated. Right Ventricle: The right ventricular size is normal. No increase in right ventricular wall thickness. Right ventricular systolic function is moderately reduced. Left Atrium: Left atrial size was severely dilated. Right Atrium: Right atrial size was moderately dilated. Pericardium: Trivial pericardial effusion is present. The pericardial effusion is localized near the right atrium. Mitral Valve: The posterior leaflet appears restrcited. There is moderate to severe eccentric MR. The mitral valve is normal in structure. Moderate to severe mitral valve regurgitation. No evidence of mitral valve stenosis. Tricuspid Valve: The tricuspid valve is normal in structure. Tricuspid valve  regurgitation is mild . No evidence of tricuspid stenosis. Aortic Valve: The aortic valve is tricuspid. There is moderate calcification of the aortic valve. Aortic valve regurgitation is trivial.  Mild aortic stenosis is present. Aortic valve mean gradient measures 4.3 mmHg. Aortic valve peak gradient measures 8.8 mmHg. Aortic valve area, by VTI measures 1.64 cm. Pulmonic Valve: The pulmonic valve was normal in structure. Pulmonic valve regurgitation is trivial. No evidence of pulmonic stenosis. Aorta: The aortic root is normal in size and structure. Venous: The inferior vena cava is dilated in size with greater than 50% respiratory variability, suggesting right atrial pressure of 8 mmHg. IAS/Shunts: No atrial level shunt detected by color flow Doppler.  LEFT VENTRICLE PLAX 2D LVIDd:         4.40 cm LVIDs:         3.40 cm LV PW:         1.20 cm LV IVS:        0.90 cm LVOT diam:     1.80 cm LV SV:         39 LV SV Index:   21 LVOT Area:     2.54 cm  RIGHT VENTRICLE            IVC RV Basal diam:  3.90 cm    IVC diam: 1.90 cm RV S prime:     7.65 cm/s TAPSE (M-mode): 2.4 cm LEFT ATRIUM              Index        RIGHT ATRIUM           Index LA diam:        4.10 cm  2.18 cm/m   RA Area:     22.00 cm LA Vol (A2C):   82.7 ml  43.88 ml/m  RA Volume:   68.00 ml  36.08 ml/m LA Vol (A4C):   101.0 ml 53.59 ml/m LA Biplane Vol: 90.5 ml  48.02 ml/m  AORTIC VALVE                    PULMONIC VALVE AV Area (Vmax):    1.48 cm     PV Vmax:       0.81 m/s AV Area (Vmean):   1.40 cm     PV Peak grad:  2.6 mmHg AV Area (VTI):     1.64 cm AV Vmax:           148.67 cm/s AV Vmean:          91.100 cm/s AV VTI:            0.238 m AV Peak Grad:      8.8 mmHg AV Mean Grad:      4.3 mmHg LVOT Vmax:         86.20 cm/s LVOT Vmean:        50.167 cm/s LVOT VTI:          0.153 m LVOT/AV VTI ratio: 0.64 AR Vena Contracta: 0.70 cm  AORTA Ao Root diam: 2.40 cm Ao Asc diam:  2.70 cm MITRAL VALVE                TRICUSPID VALVE MV Area (PHT): 3.72 cm     TV Peak grad:   28.5 mmHg MV Decel Time: 204 msec     TV Vmax:        2.67 m/s MR Peak grad: 57.2 mmHg     TR Peak grad:  25.2 mmHg MR Mean grad: 42.0 mmHg     TR Vmax:        251.00  cm/s MR Vmax:      378.00 cm/s MR Vmean:     307.0 cm/s    SHUNTS MV E velocity: 141.00 cm/s  Systemic VTI:  0.15 m MV A velocity: 58.30 cm/s   Systemic Diam: 1.80 cm MV E/A ratio:  2.42 Arvilla Meres MD Electronically signed by Arvilla Meres MD Signature Date/Time: 08/23/2023/5:24:33 PM    Final      Patient Profile     53 year old female with past medical history of chronic combined systolic/diastolic congestive heart failure, frequent PVCs, peripheral vascular disease status post bilateral AKA, tobacco abuse, COPD, prior pulmonary embolus, chronic stage IIIa kidney disease, diabetes mellitus for evaluation of acute on chronic combined systolic/diastolic congestive heart failure. Nuclear study February 2024 showed ejection fraction 55% and no ischemia or infarction. Follow-up echocardiogram shows ejection fraction 30 to 35%, moderate RV dysfunction, severe left atrial enlargement, moderate right atrial enlargement, moderate to severe mitral regurgitation, trace aortic insufficiency, mild aortic stenosis with mean gradient 4 mmHg and aortic valve area calculated 1.64 cm.  Assessment & Plan    1 acute on chronic combined systolic/diastolic congestive heart failure-patient remains mildly volume overloaded on examination.   Will continue Lasix at present dose and resume Jardiance.  Continue to follow renal function closely.   2 cardiomyopathy-LV function is worse on most recent echocardiogram.  Entresto was held due to borderline blood pressure and renal function now also worse.  Will consider resuming later if blood pressure/renal function allow.  Discontinue Inderal LA and treat with Toprol 25 mg daily in the setting of LV dysfunction.    3 history of frequent PVCs-patient's beta-blocker was previously changed to Inderal LA by electrophysiology.  Given reduced LV function however will change to Toprol 25 mg daily.  Advance as needed.   4 peripheral vascular disease-continue aspirin and  statin.   5 chronic stage IIIa kidney disease-follow renal function with diuresis.   6 hyponatremia-sodium remains 126.  Needs fluid restriction.   7 tobacco abuse-patient counseled on discontinuing.   8 chest pain-patient has occasional chest pain lasting 3 minutes not related to activities.  Not clear she would be a good candidate for aggressive cardiac evaluation at this point.  Will consider outpatient stress test.  9 moderate to severe mitral regurgitation-will need follow-up as an outpatient.  Not clear that she would be a candidate for aggressive intervention.  For questions or updates, please contact [facility] Please consult [email or web address] for contact info under        Signed, Olga Millers, MD  08/24/2023, 9:51 AM

## 2023-08-24 NOTE — TOC Initial Note (Signed)
Transition of Care [facility]) - Initial/Assessment Note    Patient Details  Name: [name] MRN: [id number] Date of Birth: 1970-08-29  [facility] CM/SW Contact:    Gala Lewandowsky, RN Phone Number: 08/24/2023, 5:11 PM  Clinical Narrative: Patient presented for  acute on chronic CHF. PTA patient was from home alone. Patient reports she has support of son, sister and brother. Patient states son takes her to all appointments. Patient has DME shower chair, transfer bench, and motorized wheelchair. Case Manager will continue to follow for transition of care needs as the patient progresses.                 Expected Discharge Plan: Home/Self Care Barriers to Discharge: Continued Medical Work up   Patient Goals and CMS Choice Patient states their goals for this hospitalization and ongoing recovery are:: Patient will return home once stable. Expected Discharge Plan and Services In-house Referral: NA Discharge Planning Services: CM Consult   Living arrangements for the past 2 months: Single Family Home   Prior Living Arrangements/Services Living arrangements for the past 2 months: Single Family Home Lives with:: Self Patient language and need for interpreter reviewed:: Yes Do you feel safe going back to the place where you live?: Yes      Need for Family Participation in Patient Care: Yes (Comment) Care giver support system in place?: Yes (comment) Current home services: DME (shower chair.) Criminal Activity/Legal Involvement Pertinent to Current Situation/Hospitalization: No - Comment as needed  Activities of Daily Living   ADL Screening (condition at time of admission) Independently performs ADLs?: Yes (appropriate for developmental age) Is the patient deaf or have difficulty hearing?: No Does the patient have difficulty seeing, even when wearing glasses/contacts?: No Does the patient have difficulty concentrating, remembering, or making decisions?:  No  Permission Sought/Granted Permission sought to share information with : Case Manager, Family Supports     Emotional Assessment Appearance:: Appears stated age Attitude/Demeanor/Rapport: Engaged Affect (typically observed): Appropriate Orientation: : Oriented to Place, Oriented to  Time, Oriented to Self, Oriented to Situation Alcohol / Substance Use: Not Applicable Psych Involvement: No (comment)  Admission diagnosis:  CHF (congestive heart failure) (HCC) [I50.9] Patient Active Problem List   Diagnosis Date Noted   Type 2 diabetes mellitus (HCC) 08/22/2023   COPD (chronic obstructive pulmonary disease) (HCC) 08/22/2023   Obesity (BMI 30-39.9) 08/22/2023   Acute on chronic diastolic CHF (congestive heart failure) (HCC) 08/21/2023   Abnormal CT scan, small bowel 06/18/2023   Impingement syndrome of left shoulder 10/22/2022   Debility 10/01/2021   CKD (chronic kidney disease) stage 3, GFR 30-59 ml/min (HCC) 09/25/2021   Hyponatremia 09/25/2021   Leukocytosis 09/25/2021   PAD (peripheral artery disease) (HCC) 09/24/2021   COPD not affecting current episode of care 09/24/2021   Tobacco dependence 09/24/2021   Nausea with vomiting 03/12/2016   Dysphagia, pharyngoesophageal phase 08/24/2014   Constipation 04/20/2014   Liver abscess 02/21/2014   RUQ pain 01/31/2014   Septic shock (HCC) 02/05/2012   Hypokalemia 02/05/2012   Normocytic anemia 02/05/2012   Acute respiratory failure with hypoxia (HCC) 01/28/2012   Appendicitis 01/28/2012   ARDS (adult respiratory distress syndrome) (HCC) 01/28/2012   Diabetes mellitus (HCC) 01/28/2012   Asthma 01/28/2012   Hypertension 01/28/2012   PCP:  Royann Shivers, PA-C Pharmacy:   [facility] - [name] - [address] [name] [address] Phone: [phone] Fax: [phone]  [facility] -  [place], [place] - [address] [facility] AT Portal to Registered [facility] [address]  [place] [address] Phone: [phone] Fax: [phone]  [facility] [address] [name] [address] Phone: [phone] Fax: [phone]     Social Drivers of Health (SDOH) Social History: SDOH Screenings   Food Insecurity: No Food Insecurity (08/23/2023)  Housing: Low Risk  (08/23/2023)  Transportation Needs: No Transportation Needs (08/23/2023)  Utilities: Not At Risk (08/23/2023)  Alcohol Screen: Low Risk  (03/18/2022)  Depression (PHQ2-9): Medium Risk (03/18/2022)  Financial Resource Strain: Low Risk  (07/23/2023)   [facility]  Physical Activity: Inactive (07/23/2023)   Received from [facility]  Social Connections: Socially Isolated (07/23/2023)   Received from [facility]  Stress: No Stress Concern Present (07/23/2023)   Received from [facility]  Tobacco Use: High Risk (08/24/2023)  Health Literacy: Inadequate Health Literacy (08/04/2023)   Received from [facility]   SDOH Interventions:     Readmission Risk Interventions     No data to display

## 2023-08-24 NOTE — Progress Notes (Addendum)
Progress Note   Patient: [name] YNW:[id number] DOB: 04/22/1971 DOA: 08/21/2023     3 DOS: the patient was seen and examined on 08/24/2023   Brief hospital course: Mrs. Mccallister was admitted with the working diagnosis of heart failure exacerbation.   53 yo female with the past medical history of heart failure, coronary artery disease, T2DM, hypertension, bilateral BKA and CKD stage 3 who presented with progressive dyspnea, abdominal distention, and lower extremity edema. She was evaluated at [facility] ED, she was diagnosed with volume overload and was transferred to [facility] for further treatment and diagnosis.  At the time of her transfer she was somnolent, blood pressure was 94/61, HR 80, RR 12 and 02 saturation 93% on 3 L/min per [place].  Ill looking appearing, somnolent but able to arouse, lungs with decreased breath sounds bilaterally, heart with S1 and S2 present and regular, abdomen with distention but no organomegaly, bilateral BKA with trace peripheral edema.   ABG 7,36/ 38/ 89/ 21/ 98% Na 130, K 4,9 Cl 98, bicarbonate 22, glucose 109 bun 18 cr 1,79 Mg 1.6  AST 28 ALT 13  BNP >4,500  Wbc 7.1 hgb 10,8 plt 213 TSH 4,7   Chest radiograph with cardiomegaly, bilateral hilar vascular congestion, cephalization of the vasculature, bilateral pleural effusions.   EKG 73 bpm, right axis, normal intervals, sinus rhythm with PAC, with no significant ST segment or T wave changes.   01/26 patient continue very lethargic, she continue to have hypoglycemia. This morning having acute respiratory and metabolic acidosis.  Starting Bipap and checking lactate.  01/27 no lactic acidosis, respiratory acidosis  has improved. Patient with persistent hypoglycemia, required D5 and then D10 infusion.   Assessment and Plan: * Acute on chronic systolic CHF (congestive heart failure) (HCC) Echocardiogram with reduced LV systolic function 30 to 35%, RV systolic function with moderate reduction, LA with  severe dilatation, RA with moderate dilatation, moderate to severe mitral regurgitation, mild aortic valve stenosis, trivial pericardial effusion,   Documented urine output is 1,050 cc  Systolic blood pressure 130 mmHg  Continue furosemide to 60 mg IV bid Resume metoprolol 25 mg succinate.   Continue to hold entresto. Continue to hold on amlodipine.  Caution with SGLT 2 inh due to recovering from severe metabolic and respiratory acidosis.   Acute hypoxemic and hypercapnic respiratory failure due to cardiogenic pulmonary edema.  Improved ventilation with Bipap (pressure control), her last VBG 7.37/ 40/ 56/ 23/ 88.4%  Today patient is on room air 97% oxygenation, with resolution of acute respiratory failure.   CKD (chronic kidney disease) stage 3, GFR 30-59 ml/min (HCC) CKD stage 3b. (Base serum cr 1,5) Hyponatremia.   Improved volume status, not clear if accurate urine output documentation.  Renal function today with serum cr at 2,0 with K at 4,4 and serum bicarbonate at 20  Pseudo hyponatremia, due to  hyperglycemia.   Continue furosemide 60 mg IV bid Check renal US Follow up renal function and electrolytes in am.   Hypertension Continue blood pressure monitoring, holding on entresto and amlodipine.  Continue diuresis   Type 2 diabetes mellitus with hyperlipidemia (HCC) Hypoglycemia, with acute metabolic encephalopathy.   She was required D5 and then D10  Now off IV dextrose infusion and tolerating po well.   Will re introduce insulin therapy with sliding scale first. Continue glucose monitoring.   Diabetic neuropathy, hold on gabapentin and tomopax, due to recovering from encephalopathy.   Continue with statin therapy.   COPD (chronic obstructive  pulmonary disease) (HCC) No signs of acute exacerbation, continue bronchodilator therapy.   Obesity (BMI 30-39.9) Not able to calculate BMI Patient with bilateral BKA.  Poor mobility.         Subjective: Patient with  significant improvement in her mentation and dyspnea, she is tolerating po well and IV dextrose has been discontinued.   Physical Exam: Vitals:   08/24/23 0758 08/24/23 1115 08/24/23 1153 08/24/23 1634  BP: 117/60 117/60 (!) 119/58 125/89  Pulse:  62 67 69  Resp: 14  (!) 21 15  Temp:   97.6 F (36.4 C) 97.6 F (36.4 C)  TempSrc:   Oral Oral  SpO2: 99%  98% 97%  Weight:      Height:       Neurology awake and alert ENT With mild pallor Cardiovascular with S1 and S2 present and regular, positive systolic murmur at the apex, no gallops, rubs or murmurs Respiratory with mild rales at bases, prolonged expiratory phase with no wheezing Abdomen with no distention  Bilateral BKA with bilateral thigh pitting edema +  Data Reviewed:    Family Communication: no family at the bedside   Disposition: Status is: Inpatient Remains inpatient appropriate because: recovery from heart failure, acidosis, hypoglycemia and encephalopathy   Planned Discharge Destination: Home      Author: Coralie Keens, MD 08/24/2023 5:10 PM  For on call review [email or web address].

## 2023-08-24 NOTE — Progress Notes (Signed)
Pt has only had 350 ml urine output. Bladder scan completed this AM with 117 ml in bladder.

## 2023-08-24 NOTE — Progress Notes (Signed)
Heart Failure Stewardship Pharmacist Progress Note   PCP: Royann Shivers, PA-C PCP-Cardiologist: Nona Dell, MD    HPI:  53 yo F with PMH of HFmrEF, frequent PVCs, severe PAD s/p bilateral AKA, tobacco use disorder, hyperlipidemia, anxiety, depression, COPD, hx of PE, type 2 diabetes. History of ECHO with EF 40% in 07/2021, improved to 55% in 07/2022. Last ECHO on 04/2023 with EF 45-50%, elevated LVEDP, normal RV function, small pericardial effusion, mild MR, mild aortic valve regurgitation. Has followed with EP as outpatient for PVCs, PACs, brief episodes of NSVT on long-term monitor. Felt not to be a candidate for ablation and her metoprolol was transitioned to propranolol.   Patient presented to [facility] ED on 1/24 with worsening SOB, abdominal distention, and LEE. Chest x-ray showed bilateral pleural effusions, BNP>7000. She was transferred to [facility] on 1/25. Once at [facility], BNP >4500. Patient somnolent with worsening wheezing on exam, noted she was given multiple doses of morphine in [place]. Narcan administered at [facility]. Also noted to be hypoglycemic, started on dextrose. Found to have acute respiratory and metabolic acidosis and was started on BiPAP. Initiated diuresis with IV lasix, home GDMT held due to hypotension and acute respiratory and metabolic acidosis. New ECHO 1/27 with LVEF 30-35%, no RWMA, RV moderately reduced, moderate to severe concentric MR.   Patient reports that London Pepper and Sherryll Burger were stopped during last hospitalization at [facility], although both of these were filled on 08/04/23. Per care everywhere, it does look like Sherryll Burger was temporarily stopped due to acute renal failure during recent hospitalization 06/2023 at [facility]. Patient also with hypoglycemia during last hospitalization.  Patient asleep upon entering room. Was difficult to wake but was able to answer some questions. Still with shortness of breath, orthopnea, abdominal edema, and edema in thighs. Currently on  2 L O2, but not on O2 at home.    Current HF Medications: Diuretic: furosemide 60 mg IV BID Beta Blocker: propranolol ER 60 mg daily  Prior to admission HF Medications: Beta blocker: propranolol ER 160 mg daily ACE/ARB/ARNI: Entresto 24/26 mg BID SGLT2i: Jardiance 25 mg daily  Pertinent Lab Values: Serum creatinine 2.03, BUN 21, Potassium 4.4, Sodium 126, BNP >4500 1/26: Magnesium 2.3 1/25: A1c 7.6  Vital Signs: Weight: 176 lbs (admission weight: 164 lbs) - bed weights Blood pressure: 120/60s  Heart rate: 60s  I/O: net +0.9 L yesterday; net +0.6L since admission  Medication Assistance / Insurance Benefits Check: Does the patient have prescription insurance?  Yes Type of insurance plan: Medicare + Medicaid  Outpatient Pharmacy:  Prior to admission outpatient pharmacy: [facility] Is the patient willing to use [facility] TOC pharmacy at discharge? Yes Is the patient willing to transition their outpatient pharmacy to utilize a [facility] outpatient pharmacy?   No    Assessment: 1. Acute on chronic HFmrEF (LVEF 30-35%), due to unclear etiology. NYHA class III symptoms. - On furosemide 60 mg IV BID. Patient with continued signs of volume overload including SOB, currently on oxygen, orthopnea, and abdominal bloating. However, has had minimal output on current dose of IV lasix and now with AKI. May need to increase dose.  - Restarted on propranolol 60 mg daily, with new low EF, consider transitioning to carvedilol/metoprolol XL  - Continue to hold off on Entresto and Jardiance with AKI - Daily weights, strict I/Os, keep K>4 and Mg>2   Plan: 1) Medication changes recommended at this time: - Stop propranolol - Start low dose coreg/toprol - May need to increase lasix  if output does not pick up  2) Patient assistance: - Patient has Medicare + Medicaid - Entresto/Jardiance $0  3)  Education  - Initial education completed, full education to be completed prior to discharge   Sharen Hones, PharmD, BCPS Heart Failure Engineer, building services Phone [phone]

## 2023-08-24 NOTE — Plan of Care (Signed)

## 2023-08-24 NOTE — Plan of Care (Signed)
Will continue to monitor patient.

## 2023-08-25 DIAGNOSIS — N1832 Chronic kidney disease, stage 3b: Secondary | ICD-10-CM | POA: Diagnosis not present

## 2023-08-25 DIAGNOSIS — I5023 Acute on chronic systolic (congestive) heart failure: Secondary | ICD-10-CM | POA: Diagnosis not present

## 2023-08-25 DIAGNOSIS — E1169 Type 2 diabetes mellitus with other specified complication: Secondary | ICD-10-CM

## 2023-08-25 DIAGNOSIS — I10 Essential (primary) hypertension: Secondary | ICD-10-CM | POA: Diagnosis not present

## 2023-08-25 DIAGNOSIS — E785 Hyperlipidemia, unspecified: Secondary | ICD-10-CM

## 2023-08-25 LAB — BASIC METABOLIC PANEL
Anion gap: 11 (ref 5–15)
BUN: 29 mg/dL — ABNORMAL HIGH (ref 6–20)
CO2: 23 mmol/L (ref 22–32)
Calcium: 8.1 mg/dL — ABNORMAL LOW (ref 8.9–10.3)
Chloride: 94 mmol/L — ABNORMAL LOW (ref 98–111)
Creatinine, Ser: 2.2 mg/dL — ABNORMAL HIGH (ref 0.44–1.00)
GFR, Estimated: 26 mL/min — ABNORMAL LOW (ref >60)
Glucose, Bld: 172 mg/dL — ABNORMAL HIGH (ref 70–99)
Potassium: 4 mmol/L (ref 3.5–5.1)
Sodium: 128 mmol/L — ABNORMAL LOW (ref 135–145)

## 2023-08-25 LAB — URINE CULTURE: Culture: 60000 — AB

## 2023-08-25 LAB — GLUCOSE, CAPILLARY
Glucose-Capillary: 194 mg/dL — ABNORMAL HIGH (ref 70–99)
Glucose-Capillary: 272 mg/dL — ABNORMAL HIGH (ref 70–99)
Glucose-Capillary: 220 mg/dL — ABNORMAL HIGH (ref 70–99)
Glucose-Capillary: 223 mg/dL — ABNORMAL HIGH (ref 70–99)

## 2023-08-25 LAB — MAGNESIUM: Magnesium: 1.9 mg/dL (ref 1.7–2.4)

## 2023-08-25 MED ORDER — MAGNESIUM SULFATE 2 GM/50ML IV SOLN
2.0000 g | Freq: Once | INTRAVENOUS | Status: AC
  Administered 2023-08-25: 2 g via INTRAVENOUS
  Filled 2023-08-25: qty 50

## 2023-08-25 MED ORDER — INSULIN GLARGINE-YFGN 100 UNIT/ML ~~LOC~~ SOLN: 5.0000 [IU] | Freq: Every day | SUBCUTANEOUS | Status: DC

## 2023-08-25 MED ORDER — INSULIN GLARGINE-YFGN 100 UNIT/ML ~~LOC~~ SOLN
8.0000 [IU] | Freq: Every day | SUBCUTANEOUS | Status: DC
  Administered 2023-08-25 – 2023-08-27 (×3): 8 [IU] via SUBCUTANEOUS
  Filled 2023-08-25 (×5): qty 0.08

## 2023-08-25 NOTE — Progress Notes (Signed)
Progress Note   Patient: [name] ZOX:[id number] DOB: Dec 21, 1970 DOA: 08/21/2023     4 DOS: the patient was seen and examined on 08/25/2023   Brief hospital course: Mrs. Delano was admitted with the working diagnosis of heart failure exacerbation.   53 yo female with the past medical history of heart failure, coronary artery disease, T2DM, hypertension, bilateral BKA and CKD stage 3 who presented with progressive dyspnea, abdominal distention, and lower extremity edema. She was evaluated at [facility] ED, she was diagnosed with volume overload and was transferred to [facility] for further treatment and diagnosis.  At the time of her transfer she was somnolent, blood pressure was 94/61, HR 80, RR 12 and 02 saturation 93% on 3 L/min per [place].  Ill looking appearing, somnolent but able to arouse, lungs with decreased breath sounds bilaterally, heart with S1 and S2 present and regular, abdomen with distention but no organomegaly, bilateral BKA with trace peripheral edema.   ABG 7,36/ 38/ 89/ 21/ 98% Na 130, K 4,9 Cl 98, bicarbonate 22, glucose 109 bun 18 cr 1,79 Mg 1.6  AST 28 ALT 13  BNP >4,500  Wbc 7.1 hgb 10,8 plt 213 TSH 4,7   Chest radiograph with cardiomegaly, bilateral hilar vascular congestion, cephalization of the vasculature, bilateral pleural effusions.   EKG 73 bpm, right axis, normal intervals, sinus rhythm with PAC, with no significant ST segment or T wave changes.   01/26 patient continue very lethargic, she continue to have hypoglycemia. This morning having acute respiratory and metabolic acidosis.  Starting Bipap and checking lactate.  01/27 no lactic acidosis, respiratory acidosis  has improved. Patient with persistent hypoglycemia, required D5 and then D10 infusion.  01/28 mentation back to baseline, dextrose infusion has been discontinued and she is tolerating po well. Continue diuresis.  01/29 clinically continue to improve.   Assessment and Plan: * Acute on chronic  systolic CHF (congestive heart failure) (HCC) Echocardiogram with reduced LV systolic function 30 to 35%, RV systolic function with moderate reduction, LA with severe dilatation, RA with moderate dilatation, moderate to severe mitral regurgitation, mild aortic valve stenosis, trivial pericardial effusion,   Documented urine output is 2,500  cc  Systolic blood pressure 120 mmHg  Continue furosemide to 60 mg IV bid metoprolol 25 mg succinate.   Continue to hold entresto. Continue to hold on amlodipine.  Caution with SGLT 2 inh due to recovering from severe metabolic and respiratory acidosis.   Acute hypoxemic and hypercapnic respiratory failure due to cardiogenic pulmonary edema.  Improved ventilation with Bipap (pressure control), her last VBG 7.37/ 40/ 56/ 23/ 88.4%  97% oxygenation on room air.   CKD (chronic kidney disease) stage 3, GFR 30-59 ml/min (HCC) CKD stage 3b. (Base serum cr 1,5) Hyponatremia.   Renal function today with serum cr at 2,2 with K at 4.0 and serum bicarbonate at 23  Na 128 Mg 1,9   Continue furosemide 60 mg IV bid Renal US with no hydronephrosis, with small amount of free fluid in the right upper quadrant.  Follow up renal function and electrolytes in am.   Hypertension Continue blood pressure monitoring, holding on entresto and amlodipine.  Continue with metoprolol.  Continue diuresis   Type 2 diabetes mellitus with hyperlipidemia (HCC) Hypoglycemia, with acute metabolic encephalopathy.   She required D5 and then D10  Now off IV dextrose infusion and tolerating po well.   Today fasting glucose is 172 mg/dl  Capillary 811, 914, 782  Continue with insulin therapy with  sliding scale first.  Add low dose long acting insulin with 8 units  Continue glucose monitoring.   Diabetic neuropathy, hold on gabapentin and tomopax, due to recovering from encephalopathy.   Continue with statin therapy.   COPD (chronic obstructive pulmonary disease) (HCC) No  signs of acute exacerbation, continue bronchodilator therapy.   Obesity (BMI 30-39.9) Not able to calculate BMI Patient with bilateral BKA.  Poor mobility.         Subjective: Patient continue to improve edema, no chest pain she has intermittent thigh pain, she is tolerating po well.   Physical Exam: Vitals:   08/25/23 0347 08/25/23 1135 08/25/23 1138 08/25/23 1649  BP: (!) 129/52 90/80 115/65 112/60  Pulse: (!) 109   96  Resp: 18  15   Temp: 98.1 F (36.7 C) 97.7 F (36.5 C)  97.7 F (36.5 C)  TempSrc: Oral Oral  Oral  SpO2: 97%  97%   Weight: 80 kg     Height:       Neurology awake and alert ENT with mild pallor Cardiovascular with S1 and S2 present and regular with no gallops, no rubs, positive systolic murmur at apex Respiratory with mild rales at bases with no wheezing or rhonchi Abdomen with no distention  Lower extremities with bilateral BKA, positive edema at the thighs +, pitting  Data Reviewed:    Family Communication: no family at the bedside   Disposition: Status is: Inpatient Remains inpatient appropriate because: IV diuresis   Planned Discharge Destination: Home     Author: Coralie Keens, MD 08/25/2023 5:05 PM  For on call review [email or web address].

## 2023-08-25 NOTE — Progress Notes (Signed)
Rounding Note    Patient Name: [name] Date of Encounter: 08/25/2023  [facility] Cardiologist: Nona Dell, MD   Subjective   No CP; dyspnea resolved  Inpatient Medications    Scheduled Meds:  aspirin EC  81 mg Oral Daily   clopidogrel  75 mg Oral Daily   cyanocobalamin  1,000 mcg Oral Daily   empagliflozin  10 mg Oral Daily   furosemide  60 mg Intravenous Q12H   Gerhardt's butt cream   Topical TID   heparin  5,000 Units Subcutaneous Q8H   insulin aspart  0-9 Units Subcutaneous TID WC   metoprolol succinate  25 mg Oral Daily   nicotine  21 mg Transdermal Daily   pantoprazole  40 mg Oral Daily   rosuvastatin  40 mg Oral Daily   sodium chloride flush  3 mL Intravenous Q12H   Continuous Infusions:  PRN Meds: acetaminophen, dextrose, docusate sodium, ipratropium-albuterol, methocarbamol, naLOXone (NARCAN)  injection, polyethylene glycol, sodium chloride flush, traMADol   Vital Signs    Vitals:   08/24/23 1943 08/24/23 2000 08/25/23 0000 08/25/23 0347  BP: (!) 126/58   (!) 129/52  Pulse: 60   (!) 109  Resp: 15   18  Temp: 97.8 F (36.6 C)   98.1 F (36.7 C)  TempSrc: Oral   Oral  SpO2: 95% 100% 99% 97%  Weight:    80 kg  Height:        Intake/Output Summary (Last 24 hours) at 08/25/2023 2130 Last data filed at 08/25/2023 0618 Gross per 24 hour  Intake 723 ml  Output 2500 ml  Net -1777 ml      08/25/2023    3:47 AM 08/24/2023    3:11 AM 08/23/2023    4:02 AM  Last 3 Weights  Weight (lbs) 176 lb 5.9 oz 176 lb 2.4 oz 173 lb 15.1 oz  Weight (kg) 80 kg 79.9 kg 78.9 kg      Telemetry    Sinus with PVCs - Personally Reviewed   Physical Exam   GEN: NAD Neck: supple, no TM Cardiac: RRR, no rub Respiratory: CTA GI: Soft, NT/ND MS: Status post bilateral AKA with 1+ edema in her stumps Neuro:  Grossly intact Psych: Normal affect   Labs     Chemistry Recent Labs  Lab 08/21/23 2240 08/22/23 0353 08/23/23 0410 08/24/23 0431  08/25/23 0513  NA  --  130* 126* 126* 128*  K  --  4.3 3.7 4.4 4.0  CL  --  99 97* 97* 94*  CO2  --  22 20* 20* 23  GLUCOSE  --  53* 92 307* 172*  BUN  --  18 18 21* 29*  CREATININE  --  1.76* 1.70* 2.03* 2.20*  CALCIUM  --  8.5* 8.0* 8.0* 8.1*  MG 1.6* 2.3  --   --  1.9  PROT  --  5.7*  --   --   --   ALBUMIN  --  2.8*  --   --   --   AST  --  28  --   --   --   ALT  --  13  --   --   --   ALKPHOS  --  177*  --   --   --   BILITOT  --  0.8  --   --   --   GFRNONAA  --  34* 35* 29* 26*  ANIONGAP  --  9 9 9  11     Hematology Recent Labs  Lab 08/22/23 0353 08/23/23 0410 08/24/23 0431  WBC 8.3 6.6 6.3  RBC 3.89 3.38* 3.19*  HGB 11.5* 10.1* 9.5*  HCT 36.0 30.9* 29.4*  MCV 92.5 91.4 92.2  MCH 29.6 29.9 29.8  MCHC 31.9 32.7 32.3  RDW 17.0* 17.0* 17.2*  PLT 250 193 198   Thyroid  Recent Labs  Lab 08/22/23 0059  TSH 4.790*    BNP Recent Labs  Lab 08/21/23 2233  BNP >4,500.0*      Radiology    US RENAL Result Date: 08/24/2023 CLINICAL DATA:  Acute kidney injury EXAM: RENAL / URINARY TRACT ULTRASOUND COMPLETE COMPARISON:  None Available. FINDINGS: Right Kidney: Renal measurements: 7.5 x 3.4 x 2.7 cm = volume: 36.5 mL. Echogenicity within normal limits. No mass or hydronephrosis visualized. Left Kidney: Renal measurements: 11.1 x 4.8 x 3.8 cm = volume: 106 mL. Echogenicity within normal limits. No mass or hydronephrosis visualized. Bladder: Under distended and not well evaluated. Other: There is a small amount of free fluid in the right upper quadrant. IMPRESSION: 1. No hydronephrosis. 2. Small amount of free fluid in the right upper quadrant. Electronically Signed   By: Darliss Cheney M.D.   On: 08/24/2023 20:58   ECHOCARDIOGRAM COMPLETE Result Date: 08/23/2023    ECHOCARDIOGRAM REPORT   Patient Name:   [name] Date of Exam: 08/23/2023 Medical Rec #:  [id number]       Height:       66.0 in Accession #:    [id number]      Weight:       173.9 lb Date of Birth:   03/10/1971       BSA:          1.885 m Patient Age:    52 years        BP:           116/65 mmHg Patient Gender: F               HR:           36 bpm. Exam Location:  Inpatient Procedure: 2D Echo, Color Doppler and Cardiac Doppler Indications:    CHF I50.21  History:        Patient has prior history of Echocardiogram examinations, most                 recent 05/20/2023. CHF, CKD, COPD and PAD; Risk                 Factors:Diabetes, Hypertension and Current Smoker.  Sonographer:    Dondra Prader RVT RCS Referring Phys: [id number] Jonita Albee IMPRESSIONS  1. Left ventricular ejection fraction, by estimation, is 30 to 35%. The left ventricle has moderately decreased function. The left ventricle has no regional wall motion abnormalities. Left ventricular diastolic function could not be evaluated.  2. Right ventricular systolic function is moderately reduced. The right ventricular size is normal.  3. Left atrial size was severely dilated.  4. Right atrial size was moderately dilated.  5. The posterior leaflet appears restrcited. There is moderate to severe eccentric MR. The mitral valve is normal in structure. Moderate to severe mitral valve regurgitation. No evidence of mitral stenosis.  6. The aortic valve is tricuspid. There is moderate calcification of the aortic valve. Aortic valve regurgitation is trivial. Mild aortic valve stenosis.  7. The inferior vena cava is dilated in size with >50% respiratory variability, suggesting right atrial pressure of 8  mmHg. FINDINGS  Left Ventricle: Left ventricular ejection fraction, by estimation, is 30 to 35%. The left ventricle has moderately decreased function. The left ventricle has no regional wall motion abnormalities. The left ventricular internal cavity size was normal in size. There is no left ventricular hypertrophy. Left ventricular diastolic function could not be evaluated due to atrial fibrillation. Left ventricular diastolic function could not be evaluated. Right  Ventricle: The right ventricular size is normal. No increase in right ventricular wall thickness. Right ventricular systolic function is moderately reduced. Left Atrium: Left atrial size was severely dilated. Right Atrium: Right atrial size was moderately dilated. Pericardium: Trivial pericardial effusion is present. The pericardial effusion is localized near the right atrium. Mitral Valve: The posterior leaflet appears restrcited. There is moderate to severe eccentric MR. The mitral valve is normal in structure. Moderate to severe mitral valve regurgitation. No evidence of mitral valve stenosis. Tricuspid Valve: The tricuspid valve is normal in structure. Tricuspid valve regurgitation is mild . No evidence of tricuspid stenosis. Aortic Valve: The aortic valve is tricuspid. There is moderate calcification of the aortic valve. Aortic valve regurgitation is trivial. Mild aortic stenosis is present. Aortic valve mean gradient measures 4.3 mmHg. Aortic valve peak gradient measures 8.8 mmHg. Aortic valve area, by VTI measures 1.64 cm. Pulmonic Valve: The pulmonic valve was normal in structure. Pulmonic valve regurgitation is trivial. No evidence of pulmonic stenosis. Aorta: The aortic root is normal in size and structure. Venous: The inferior vena cava is dilated in size with greater than 50% respiratory variability, suggesting right atrial pressure of 8 mmHg. IAS/Shunts: No atrial level shunt detected by color flow Doppler.  LEFT VENTRICLE PLAX 2D LVIDd:         4.40 cm LVIDs:         3.40 cm LV PW:         1.20 cm LV IVS:        0.90 cm LVOT diam:     1.80 cm LV SV:         39 LV SV Index:   21 LVOT Area:     2.54 cm  RIGHT VENTRICLE            IVC RV Basal diam:  3.90 cm    IVC diam: 1.90 cm RV S prime:     7.65 cm/s TAPSE (M-mode): 2.4 cm LEFT ATRIUM              Index        RIGHT ATRIUM           Index LA diam:        4.10 cm  2.18 cm/m   RA Area:     22.00 cm LA Vol (A2C):   82.7 ml  43.88 ml/m  RA Volume:    68.00 ml  36.08 ml/m LA Vol (A4C):   101.0 ml 53.59 ml/m LA Biplane Vol: 90.5 ml  48.02 ml/m  AORTIC VALVE                    PULMONIC VALVE AV Area (Vmax):    1.48 cm     PV Vmax:       0.81 m/s AV Area (Vmean):   1.40 cm     PV Peak grad:  2.6 mmHg AV Area (VTI):     1.64 cm AV Vmax:           148.67 cm/s AV Vmean:  91.100 cm/s AV VTI:            0.238 m AV Peak Grad:      8.8 mmHg AV Mean Grad:      4.3 mmHg LVOT Vmax:         86.20 cm/s LVOT Vmean:        50.167 cm/s LVOT VTI:          0.153 m LVOT/AV VTI ratio: 0.64 AR Vena Contracta: 0.70 cm  AORTA Ao Root diam: 2.40 cm Ao Asc diam:  2.70 cm MITRAL VALVE                TRICUSPID VALVE MV Area (PHT): 3.72 cm     TV Peak grad:   28.5 mmHg MV Decel Time: 204 msec     TV Vmax:        2.67 m/s MR Peak grad: 57.2 mmHg     TR Peak grad:   25.2 mmHg MR Mean grad: 42.0 mmHg     TR Vmax:        251.00 cm/s MR Vmax:      378.00 cm/s MR Vmean:     307.0 cm/s    SHUNTS MV E velocity: 141.00 cm/s  Systemic VTI:  0.15 m MV A velocity: 58.30 cm/s   Systemic Diam: 1.80 cm MV E/A ratio:  2.42 Arvilla Meres MD Electronically signed by Arvilla Meres MD Signature Date/Time: 08/23/2023/5:24:33 PM    Final      Patient Profile     53 year old female with past medical history of chronic combined systolic/diastolic congestive heart failure, frequent PVCs, peripheral vascular disease status post bilateral AKA, tobacco abuse, COPD, prior pulmonary embolus, chronic stage IIIa kidney disease, diabetes mellitus for evaluation of acute on chronic combined systolic/diastolic congestive heart failure. Nuclear study February 2024 showed ejection fraction 55% and no ischemia or infarction. Follow-up echocardiogram shows ejection fraction 30 to 35%, moderate RV dysfunction, severe left atrial enlargement, moderate right atrial enlargement, moderate to severe mitral regurgitation, trace aortic insufficiency, mild aortic stenosis with mean gradient 4 mmHg and aortic valve  area calculated 1.64 cm.  Assessment & Plan    1 acute on chronic combined systolic/diastolic congestive heart failure-volume status improving but still overloaded.  Continue Lasix and Jardiance at present dose.  Follow renal function closely.     2 cardiomyopathy-LV function is worse on most recent echocardiogram.  Entresto was held due to borderline blood pressure and worsening renal function.  Will consider resuming later if blood pressure/renal function allow.  Will continue Toprol for now.   3 history of frequent PVCs-continue low-dose Toprol.  Will increase after CHF improves.   4 peripheral vascular disease-continue aspirin and statin.   5 chronic stage IIIa kidney disease-creatinine slightly increased today.  Continue to follow.  Will need to accept some degree of renal insufficiency to keep patient euvolemic.   6 hyponatremia-sodium increased to 128 today.  Continue fluid restriction.   7 tobacco abuse-patient counseled on discontinuing.   8 chest pain-patient has not had chest pain since admission.  Not clear that she would be a good candidate for aggressive cardiac evaluation.  Will consider outpatient stress test once CHF improves.  Patient has occasional chest pain lasting 3 minutes not related to activities.  Not clear she would be a good candidate for aggressive cardiac evaluation at this point.  Will consider outpatient stress test.  9 moderate to severe mitral regurgitation-will need follow-up as an outpatient.  Not clear that she  would be a candidate for aggressive intervention.  For questions or updates, please contact [facility] Please consult [email or web address] for contact info under        Signed, Olga Millers, MD  08/25/2023, 8:21 AM

## 2023-08-25 NOTE — Progress Notes (Signed)
Heart Failure Stewardship Pharmacist Progress Note   PCP: Royann Shivers, PA-C PCP-Cardiologist: Nona Dell, MD    HPI:  53 yo F with PMH of HFmrEF, frequent PVCs, severe PAD s/p bilateral AKA, tobacco use disorder, hyperlipidemia, anxiety, depression, COPD, hx of PE, type 2 diabetes. History of ECHO with EF 40% in 07/2021, improved to 55% in 07/2022. Last ECHO on 04/2023 with EF 45-50%, elevated LVEDP, normal RV function, small pericardial effusion, mild MR, mild aortic valve regurgitation. Has followed with EP as outpatient for PVCs, PACs, brief episodes of NSVT on long-term monitor. Felt not to be a candidate for ablation and her metoprolol was transitioned to propranolol.   Patient presented to [facility] ED on 1/24 with worsening SOB, abdominal distention, and LEE. Chest x-ray showed bilateral pleural effusions, BNP>7000. She was transferred to [facility] on 1/25. Once at [facility], BNP >4500. Patient somnolent with worsening wheezing on exam, noted she was given multiple doses of morphine in [place]. Narcan administered at [facility]. Also noted to be hypoglycemic, started on dextrose. Found to have acute respiratory and metabolic acidosis and was started on BiPAP. Initiated diuresis with IV lasix, home GDMT held due to hypotension and acute respiratory and metabolic acidosis. New ECHO 1/27 with LVEF 30-35%, no RWMA, RV moderately reduced, moderate to severe concentric MR.   Patient reports that London Pepper and Sherryll Burger were stopped during last hospitalization at [facility], although both of these were filled on 08/04/23. Per care everywhere, it does look like Sherryll Burger was temporarily stopped due to acute renal failure during recent hospitalization 06/2023 at [facility]. Patient also with hypoglycemia during last hospitalization.   Patient eating lunch on my arrival to the room and more talkative today. Remains on 2 L O2, but not on oxygen at home. Initially on my arrival she was not wearing oxygen and O2 sat was  97%. LEE edema still present in thighs, patient states abdominal edema feels about the same, and SOB is improving.   Current HF Medications: Diuretic: furosemide 60 mg IV BID Beta Blocker: metoprolol XL 25 mg daily SGLT2i: Jardiance 10 mg daily  Prior to admission HF Medications: Beta blocker: propranolol ER 160 mg daily ACE/ARB/ARNI: Entresto 24/26 mg BID SGLT2i: Jardiance 25 mg daily  Pertinent Lab Values: Serum creatinine 2.20, BUN 29, Potassium 4.0, Sodium 128, BNP >4500 1/26: Magnesium 1.9 1/25: A1c 7.6  Vital Signs: Weight: 176 lbs (admission weight: 164 lbs) - bed weights Blood pressure: 120/50-60s  Heart rate: 60s-70s I/O: net -0.4 L yesterday; net -1.1 L since admission  Medication Assistance / Insurance Benefits Check: Does the patient have prescription insurance?  Yes Type of insurance plan: Medicare + Medicaid  Outpatient Pharmacy:  Prior to admission outpatient pharmacy: [facility] Is the patient willing to use [facility] TOC pharmacy at discharge? Yes Is the patient willing to transition their outpatient pharmacy to utilize a [facility] outpatient pharmacy?   No    Assessment: 1. Acute on chronic HFmrEF (LVEF 30-35%), due to unclear etiology. NYHA class III symptoms. - On furosemide 60 mg IV BID. Patient with continued signs of volume overload including orthopnea, abdominal bloating, and LEE in thighs. She notes some improvement in SOB today. Slow output with current lasix dose and renal function worsened today. Continue to monitor closely. - Continue metoprolol XL 25 mg daily  - Continue Jardiance 10 mg daily. Output improved with addition of Jardiance yesterday.  - Continue to hold off on Entresto with AKI - Daily weights, strict I/Os, keep K>4 and  Mg>2   Plan: 1) Medication changes recommended at this time: - No changes at this time   2) Patient assistance: - Patient has Medicare + Medicaid - Entresto/Jardiance $0   3)  Education  - Initial education  completed, full education to be completed prior to discharge   Jarrett Ables, PharmD PGY-1 Pharmacy Resident  Sharen Hones, PharmD, BCPS Heart Failure Stewardship Pharmacist Phone [phone]

## 2023-08-25 NOTE — Inpatient Diabetes Management (Signed)
Inpatient Diabetes Program Recommendations  AACE/ADA: New Consensus Statement on Inpatient Glycemic Control (2015)  Target Ranges:  Prepandial:   less than 140 mg/dL      Peak postprandial:   less than 180 mg/dL (1-2 hours)      Critically ill patients:  140 - 180 mg/dL   Lab Results  Component Value Date   GLUCAP 272 (H) 08/25/2023   HGBA1C 7.6 (H) 08/21/2023    Review of Glycemic Control  Latest Reference Range & Units 08/23/23 21:00 08/24/23 07:53 08/24/23 11:50 08/24/23 16:30 08/24/23 21:01 08/25/23 07:33 08/25/23 11:32  Glucose-Capillary 70 - 99 mg/dL 295 (H) 621 (H) 308 (H) 211 (H) 192 (H) 194 (H) 272 (H)  (H): Data is abnormally high Diabetes history: Type 2 DM Outpatient Diabetes medications: Lantus 10 units BID, Jardiance (NT), Ozempic (NT) Current orders for Inpatient glycemic control: Jardiance 10 mg every day, Novolog 0-9 units TID  Inpatient Diabetes Program Recommendations:    Consider adding Semglee 8 units every day.   Thanks, Lujean Rave, MSN, RNC-OB Diabetes Coordinator [phone] (8a-5p)

## 2023-08-25 NOTE — Plan of Care (Signed)

## 2023-08-26 DIAGNOSIS — N1831 Chronic kidney disease, stage 3a: Secondary | ICD-10-CM | POA: Diagnosis not present

## 2023-08-26 DIAGNOSIS — E1169 Type 2 diabetes mellitus with other specified complication: Secondary | ICD-10-CM | POA: Diagnosis not present

## 2023-08-26 DIAGNOSIS — I10 Essential (primary) hypertension: Secondary | ICD-10-CM | POA: Diagnosis not present

## 2023-08-26 DIAGNOSIS — I5023 Acute on chronic systolic (congestive) heart failure: Secondary | ICD-10-CM | POA: Diagnosis not present

## 2023-08-26 DIAGNOSIS — J410 Simple chronic bronchitis: Secondary | ICD-10-CM

## 2023-08-26 LAB — GLUCOSE, CAPILLARY
Glucose-Capillary: 165 mg/dL — ABNORMAL HIGH (ref 70–99)
Glucose-Capillary: 192 mg/dL — ABNORMAL HIGH (ref 70–99)
Glucose-Capillary: 250 mg/dL — ABNORMAL HIGH (ref 70–99)
Glucose-Capillary: 231 mg/dL — ABNORMAL HIGH (ref 70–99)
Glucose-Capillary: 200 mg/dL — ABNORMAL HIGH (ref 70–99)

## 2023-08-26 LAB — CBC
HCT: 29.8 % — ABNORMAL LOW (ref 36.0–46.0)
Hemoglobin: 9.5 g/dL — ABNORMAL LOW (ref 12.0–15.0)
MCH: 29.4 pg (ref 26.0–34.0)
MCHC: 31.9 g/dL (ref 30.0–36.0)
MCV: 92.3 fL (ref 80.0–100.0)
Platelets: 166 10*3/uL (ref 150–400)
RBC: 3.23 MIL/uL — ABNORMAL LOW (ref 3.87–5.11)
RDW: 17.2 % — ABNORMAL HIGH (ref 11.5–15.5)
WBC: 5.7 10*3/uL (ref 4.0–10.5)
nRBC: 0 % (ref 0.0–0.2)

## 2023-08-26 LAB — BASIC METABOLIC PANEL
Anion gap: 13 (ref 5–15)
BUN: 35 mg/dL — ABNORMAL HIGH (ref 6–20)
CO2: 20 mmol/L — ABNORMAL LOW (ref 22–32)
Calcium: 8.2 mg/dL — ABNORMAL LOW (ref 8.9–10.3)
Chloride: 96 mmol/L — ABNORMAL LOW (ref 98–111)
Creatinine, Ser: 2.2 mg/dL — ABNORMAL HIGH (ref 0.44–1.00)
GFR, Estimated: 26 mL/min — ABNORMAL LOW (ref >60)
Glucose, Bld: 190 mg/dL — ABNORMAL HIGH (ref 70–99)
Potassium: 4.5 mmol/L (ref 3.5–5.1)
Sodium: 129 mmol/L — ABNORMAL LOW (ref 135–145)

## 2023-08-26 LAB — MAGNESIUM: Magnesium: 2.1 mg/dL (ref 1.7–2.4)

## 2023-08-26 MED ORDER — DULOXETINE HCL 20 MG PO CPEP
40.0000 mg | ORAL_CAPSULE | Freq: Every day | ORAL | Status: DC
  Administered 2023-08-26 – 2023-08-28 (×3): 40 mg via ORAL
  Filled 2023-08-26 (×4): qty 2

## 2023-08-26 MED ORDER — ALPRAZOLAM 0.5 MG PO TABS
0.5000 mg | ORAL_TABLET | Freq: Three times a day (TID) | ORAL | Status: DC | PRN
  Administered 2023-08-27 – 2023-08-31 (×5): 0.5 mg via ORAL
  Filled 2023-08-26 (×6): qty 1

## 2023-08-26 MED ORDER — GABAPENTIN 300 MG PO CAPS
300.0000 mg | ORAL_CAPSULE | Freq: Three times a day (TID) | ORAL | Status: DC
  Administered 2023-08-26 – 2023-08-28 (×7): 300 mg via ORAL
  Filled 2023-08-26 (×8): qty 1

## 2023-08-26 MED ORDER — METHOCARBAMOL 500 MG PO TABS
500.0000 mg | ORAL_TABLET | Freq: Three times a day (TID) | ORAL | Status: DC
  Administered 2023-08-26 – 2023-08-28 (×7): 500 mg via ORAL
  Filled 2023-08-26 (×7): qty 1

## 2023-08-26 MED ORDER — FUROSEMIDE 10 MG/ML IJ SOLN
80.0000 mg | Freq: Two times a day (BID) | INTRAMUSCULAR | Status: DC
  Administered 2023-08-26 – 2023-09-02 (×15): 80 mg via INTRAVENOUS
  Filled 2023-08-26 (×16): qty 8

## 2023-08-26 NOTE — Progress Notes (Signed)
Rounding Note    Patient Name: [name] Date of Encounter: 08/26/2023  [facility] Cardiologist: Nona Dell, MD   Subjective   Denies CP or dyspnea  Inpatient Medications    Scheduled Meds:  aspirin EC  81 mg Oral Daily   clopidogrel  75 mg Oral Daily   cyanocobalamin  1,000 mcg Oral Daily   empagliflozin  10 mg Oral Daily   furosemide  60 mg Intravenous Q12H   Gerhardt's butt cream   Topical TID   heparin  5,000 Units Subcutaneous Q8H   insulin aspart  0-9 Units Subcutaneous TID WC   insulin glargine-yfgn  8 Units Subcutaneous QHS   metoprolol succinate  25 mg Oral Daily   nicotine  21 mg Transdermal Daily   pantoprazole  40 mg Oral Daily   rosuvastatin  40 mg Oral Daily   sodium chloride flush  3 mL Intravenous Q12H   Continuous Infusions:  PRN Meds: acetaminophen, dextrose, docusate sodium, ipratropium-albuterol, methocarbamol, naLOXone (NARCAN)  injection, polyethylene glycol, sodium chloride flush, traMADol   Vital Signs    Vitals:   08/26/23 0400 08/26/23 0526 08/26/23 0623 08/26/23 0732  BP:  (!) 118/53  129/69  Pulse:  60  69  Resp:  17 17 18   Temp:  97.7 F (36.5 C)  97.8 F (36.6 C)  TempSrc:  Oral  Oral  SpO2: 96% 97% 99% 100%  Weight:  80 kg    Height:        Intake/Output Summary (Last 24 hours) at 08/26/2023 0857 Last data filed at 08/26/2023 0532 Gross per 24 hour  Intake 2020 ml  Output 2600 ml  Net -580 ml      08/26/2023    5:26 AM 08/25/2023    3:47 AM 08/24/2023    3:11 AM  Last 3 Weights  Weight (lbs) 176 lb 5.9 oz 176 lb 5.9 oz 176 lb 2.4 oz  Weight (kg) 80 kg 80 kg 79.9 kg      Telemetry    Sinus with PVCs - Personally Reviewed   Physical Exam   GEN: NAD, chronically ill appearing Neck: supple Cardiac: RRR Respiratory: CTA; no wheeze GI: Soft, NT/ND MS: Status post bilateral AKA with 1+ edema in her stumps Neuro:  no focal findings Psych: Normal affect   Labs     Chemistry Recent Labs   Lab 08/22/23 0353 08/23/23 0410 08/24/23 0431 08/25/23 0513 08/26/23 0345  NA 130*   < > 126* 128* 129*  K 4.3   < > 4.4 4.0 4.5  CL 99   < > 97* 94* 96*  CO2 22   < > 20* 23 20*  GLUCOSE 53*   < > 307* 172* 190*  BUN 18   < > 21* 29* 35*  CREATININE 1.76*   < > 2.03* 2.20* 2.20*  CALCIUM 8.5*   < > 8.0* 8.1* 8.2*  MG 2.3  --   --  1.9 2.1  PROT 5.7*  --   --   --   --   ALBUMIN 2.8*  --   --   --   --   AST 28  --   --   --   --   ALT 13  --   --   --   --   ALKPHOS 177*  --   --   --   --   BILITOT 0.8  --   --   --   --  GFRNONAA 34*   < > 29* 26* 26*  ANIONGAP 9   < > 9 11 13    < > = values in this interval not displayed.     Hematology Recent Labs  Lab 08/22/23 0353 08/23/23 0410 08/24/23 0431  WBC 8.3 6.6 6.3  RBC 3.89 3.38* 3.19*  HGB 11.5* 10.1* 9.5*  HCT 36.0 30.9* 29.4*  MCV 92.5 91.4 92.2  MCH 29.6 29.9 29.8  MCHC 31.9 32.7 32.3  RDW 17.0* 17.0* 17.2*  PLT 250 193 198   Thyroid  Recent Labs  Lab 08/22/23 0059  TSH 4.790*    BNP Recent Labs  Lab 08/21/23 2233  BNP >4,500.0*      Radiology    US RENAL Result Date: 08/24/2023 CLINICAL DATA:  Acute kidney injury EXAM: RENAL / URINARY TRACT ULTRASOUND COMPLETE COMPARISON:  None Available. FINDINGS: Right Kidney: Renal measurements: 7.5 x 3.4 x 2.7 cm = volume: 36.5 mL. Echogenicity within normal limits. No mass or hydronephrosis visualized. Left Kidney: Renal measurements: 11.1 x 4.8 x 3.8 cm = volume: 106 mL. Echogenicity within normal limits. No mass or hydronephrosis visualized. Bladder: Under distended and not well evaluated. Other: There is a small amount of free fluid in the right upper quadrant. IMPRESSION: 1. No hydronephrosis. 2. Small amount of free fluid in the right upper quadrant. Electronically Signed   By: Darliss Cheney M.D.   On: 08/24/2023 20:58     Patient Profile     53 year old female with past medical history of chronic combined systolic/diastolic congestive heart failure,  frequent PVCs, peripheral vascular disease status post bilateral AKA, tobacco abuse, COPD, prior pulmonary embolus, chronic stage IIIa kidney disease, diabetes mellitus for evaluation of acute on chronic combined systolic/diastolic congestive heart failure. Nuclear study February 2024 showed ejection fraction 55% and no ischemia or infarction. Follow-up echocardiogram shows ejection fraction 30 to 35%, moderate RV dysfunction, severe left atrial enlargement, moderate right atrial enlargement, moderate to severe mitral regurgitation, trace aortic insufficiency, mild aortic stenosis with mean gradient 4 mmHg and aortic valve area calculated 1.64 cm.  Assessment & Plan    1 acute on chronic combined systolic/diastolic congestive heart failure-symptoms are improving but remains volume overloaded.  Increase Lasix to 80 mg IV twice daily and continue Jardiance.  Continue to follow renal function.     2 cardiomyopathy-LV function is worse on most recent echocardiogram.  Entresto was held due to borderline blood pressure and worsening renal function.  Will consider resuming later if blood pressure/renal function allow.  Will continue Toprol for now.   3 history of frequent PVCs-continue low-dose Toprol.  Will increase after CHF improves.   4 peripheral vascular disease-continue aspirin and statin.   5 chronic stage IIIa kidney disease-creatinine unchanged today.  Continue to follow.   6 hyponatremia-sodium increased to 129 today.  Continue fluid restriction.   7 tobacco abuse-patient counseled on discontinuing.   8 chest pain-patient has not had chest pain since admission.  Not clear that she would be a good candidate for aggressive cardiac evaluation.  Will consider outpatient stress test once CHF improves.  Patient has occasional chest pain lasting 3 minutes not related to activities.  Not clear she would be a good candidate for aggressive cardiac evaluation at this point.  Will consider outpatient  stress test.  9 moderate to severe mitral regurgitation-will need follow-up as an outpatient.  Not clear that she would be a candidate for aggressive intervention.  For questions or updates, please contact [facility] Please consult [email or web address] for contact info under        Signed, Olga Millers, MD  08/26/2023, 8:57 AM

## 2023-08-26 NOTE — Progress Notes (Signed)
Patient has oozing sq heparin site.  3 bandaids saturated, pressure dressing added.  Dr Isidoro Donning stated to hold 2pm dose of heparin.

## 2023-08-26 NOTE — Progress Notes (Signed)
[facility]                                                                              [name], is a 53 y.o. female, DOB - 11-30-1970, ZOX:[id number] Admit date - 08/21/2023    Outpatient Primary MD for the patient is Royann Shivers, PA-C  LOS - 5  days  No chief complaint on file.      Brief summary   Patient is a 53 year old female with CHF, CAD, diabetes mellitus type 2, HTN, bilateral BKA and CKD stage 3 presented with progressive shortness of breath, abdominal distention, lower extremity edema.   She was evaluated at [facility] ED, she was diagnosed with volume overload and was transferred to [facility] for further treatment and diagnosis.  At the time of her transfer she was somnolent, blood pressure was 94/61, HR 80, RR 12 and 02 saturation 93% on 3 L/min per [place].  BNP > 4500, creatinine 1.7 Chest x-ray showed cardiomegaly with bilateral hilar vascular congestion, cephalization of the vasculature, bilateral pleural effusions.  Significant events 01/26: Lethargic, hypoglycemia, started on BiPAP, acute respiratory and metabolic acidosis  01/27: Persistent hypoglycemia, respiratory acidosis improved, no lactic acidosis.  Placed on D5, then D10 infusion  01/28: mentation back to baseline, D10 discontinued.  On diuresis.  01/30: Clinically improving   Assessment & Plan    Principal Problem:   Acute on chronic systolic and diastolic combined CHF (congestive heart failure) (HCC) -2D echo showed EF 30 to 35%, RV systolic function with moderate reduction, LA with severe dilation, RA with moderate dilation, moderate to severe MR, aortic valve stenosis, trivial pericardial effusion -Cardiology consulted, still has volume overload, increased Lasix to 80 mg twice daily -strict I's and O's with daily weights.  Negative balance of 1.2 L -Continue Jardiance, Toprol-XL -Continue to hold Entresto, amlodipine -Caution with SGLT 2 inh due to recovering from severe  metabolic and respiratory acidosis.    Acute hypoxemic and hypercapnic respiratory failure due to cardiogenic pulmonary edema. -Improved ventilation with BiPAP on admission -Now off, O2 sat is 93 to 100% on room air    Acute on CKD (chronic kidney disease) stage 3b, GFR 30-59 ml/min (HCC) Baseline creatinine 1.5 -Presented with creatinine of 1.79, now plateaued at 2.2 -Closely monitor renal function with Lasix IV - Renal US with no hydronephrosis, with small amount of free fluid in the right upper quadrant.     Hypertension -BP stable, continue diuresis, metoprolol    Type 2 diabetes mellitus with hyperlipidemia (HCC) Hypoglycemia may have acute metabolic encephalopathy -Initially hypoglycemia requiring D5 and D10 infusion.    -Now CBGs elevated CBG (last 3)  Recent Labs    08/25/23 2046 08/26/23 0731 08/26/23 1206  GLUCAP 223* 165* 192*   -Continue sliding scale insulin, sensitive, Semglee 8 units at bedtime      Diabetic neuropathy, -Complaining of pain in the lower extremities, will resume Neurontin 300 mg 3 times daily, continue Robaxin, Cymbalta -Continue tramadol   COPD (chronic obstructive pulmonary disease) (HCC) No signs of acute exacerbation, continue bronchodilator therapy.    Estimated body mass index is 28.47 kg/m as calculated  from the following:   Height as of this encounter: 5\' 6"  (1.676 m).   Weight as of this encounter: 80 kg.  Code Status: full  DVT Prophylaxis:  heparin injection 5,000 Units Start: 08/21/23 2315   Level of Care: Level of care: Progressive Family Communication: Updated patient Disposition Plan:      Remains inpatient appropriate:      Procedures:   Consultants:   Cardiology  Antimicrobials:   Anti-infectives (From admission, onward)    None          Medications  aspirin EC  81 mg Oral Daily   clopidogrel  75 mg Oral Daily   cyanocobalamin  1,000 mcg Oral Daily   DULoxetine  40 mg Oral Daily    empagliflozin  10 mg Oral Daily   furosemide  80 mg Intravenous Q12H   gabapentin  300 mg Oral TID   Gerhardt's butt cream   Topical TID   heparin  5,000 Units Subcutaneous Q8H   insulin aspart  0-9 Units Subcutaneous TID WC   insulin glargine-yfgn  8 Units Subcutaneous QHS   methocarbamol  500 mg Oral TID   metoprolol succinate  25 mg Oral Daily   nicotine  21 mg Transdermal Daily   pantoprazole  40 mg Oral Daily   rosuvastatin  40 mg Oral Daily   sodium chloride flush  3 mL Intravenous Q12H      Subjective:   [name] was seen and examined today.  No acute complaints except pain in the lower extremities.  No chest pain, shortness of breath, fevers, nausea vomiting.  Somewhat anxious.  Still has volume overload.  Objective:   Vitals:   08/26/23 0623 08/26/23 0732 08/26/23 1019 08/26/23 1206  BP:  129/69 (!) 126/50 (!) 123/55  Pulse:  69 65 65  Resp: 17 18  16   Temp:  97.8 F (36.6 C)  97.6 F (36.4 C)  TempSrc:  Oral  Oral  SpO2: 99% 100%  100%  Weight:      Height:        Intake/Output Summary (Last 24 hours) at 08/26/2023 1442 Last data filed at 08/26/2023 1100 Gross per 24 hour  Intake 1540 ml  Output 2600 ml  Net -1060 ml     Wt Readings from Last 3 Encounters:  08/26/23 80 kg  04/30/23 86.6 kg  12/01/22 86.6 kg     Exam General: Alert and oriented, chronically ill, deconditioned. Cardiovascular: S1 S2 auscultated,  RRR Respiratory: Clear to auscultation bilaterally, no wheezing Gastrointestinal: Soft, nontender, nondistended, + bowel sounds Ext: bilateral AKA, 1+ edema in her stumps. Neuro: no new FND's Psych: Normal affect     Data Reviewed:  I have personally reviewed following labs    CBC Lab Results  Component Value Date   WBC 6.3 08/24/2023   RBC 3.19 (L) 08/24/2023   HGB 9.5 (L) 08/24/2023   HCT 29.4 (L) 08/24/2023   MCV 92.2 08/24/2023   MCH 29.8 08/24/2023   PLT 198 08/24/2023   MCHC 32.3 08/24/2023   RDW 17.2 (H)  08/24/2023   LYMPHSABS 2.0 08/24/2023   MONOABS 0.6 08/24/2023   EOSABS 0.1 08/24/2023   BASOSABS 0.1 08/24/2023     Last metabolic panel Lab Results  Component Value Date   NA 129 (L) 08/26/2023   K 4.5 08/26/2023   CL 96 (L) 08/26/2023   CO2 20 (L) 08/26/2023   BUN 35 (H) 08/26/2023   CREATININE 2.20 (H) 08/26/2023   GLUCOSE 190 (  H) 08/26/2023   GFRNONAA 26 (L) 08/26/2023   GFRAA 77 (L) 09/06/2014   CALCIUM 8.2 (L) 08/26/2023   PHOS 3.3 01/29/2012   PROT 5.7 (L) 08/22/2023   ALBUMIN 2.8 (L) 08/22/2023   BILITOT 0.8 08/22/2023   ALKPHOS 177 (H) 08/22/2023   AST 28 08/22/2023   ALT 13 08/22/2023   ANIONGAP 13 08/26/2023    CBG (last 3)  Recent Labs    08/25/23 2046 08/26/23 0731 08/26/23 1206  GLUCAP 223* 165* 192*      Coagulation Profile: No results for input(s): "INR", "PROTIME" in the last 168 hours.   Radiology Studies: I have personally reviewed the imaging studies  US RENAL Result Date: 08/24/2023 CLINICAL DATA:  Acute kidney injury EXAM: RENAL / URINARY TRACT ULTRASOUND COMPLETE COMPARISON:  None Available. FINDINGS: Right Kidney: Renal measurements: 7.5 x 3.4 x 2.7 cm = volume: 36.5 mL. Echogenicity within normal limits. No mass or hydronephrosis visualized. Left Kidney: Renal measurements: 11.1 x 4.8 x 3.8 cm = volume: 106 mL. Echogenicity within normal limits. No mass or hydronephrosis visualized. Bladder: Under distended and not well evaluated. Other: There is a small amount of free fluid in the right upper quadrant. IMPRESSION: 1. No hydronephrosis. 2. Small amount of free fluid in the right upper quadrant. Electronically Signed   By: Darliss Cheney M.D.   On: 08/24/2023 20:58       Jorey Dollard M.D. [facility] 08/26/2023, 2:42 PM  Available via Epic secure chat 7am-7pm After 7 pm, please refer to night coverage provider listed on amion.

## 2023-08-26 NOTE — Progress Notes (Addendum)
Heart Failure Stewardship Pharmacist Progress Note   PCP: Royann Shivers, PA-C PCP-Cardiologist: Nona Dell, MD    HPI:  53 yo F with PMH of HFmrEF, frequent PVCs, severe PAD s/p bilateral AKA, tobacco use disorder, hyperlipidemia, anxiety, depression, COPD, hx of PE, type 2 diabetes. History of ECHO with EF 40% in 07/2021, improved to 55% in 07/2022. Last ECHO on 04/2023 with EF 45-50%, elevated LVEDP, normal RV function, small pericardial effusion, mild MR, mild aortic valve regurgitation. Has followed with EP as outpatient for PVCs, PACs, brief episodes of NSVT on long-term monitor. Felt not to be a candidate for ablation and her metoprolol was transitioned to propranolol.   Patient presented to [facility] ED on 1/24 with worsening SOB, abdominal distention, and LEE. Chest x-ray showed bilateral pleural effusions, BNP>7000. She was transferred to [facility] on 1/25. Once at [facility], BNP >4500. Patient somnolent with worsening wheezing on exam, noted she was given multiple doses of morphine in [place]. Narcan administered at [facility]. Also noted to be hypoglycemic, started on dextrose. Found to have acute respiratory and metabolic acidosis and was started on BiPAP. Initiated diuresis with IV lasix, home GDMT held due to hypotension and acute respiratory and metabolic acidosis. New ECHO 1/27 with LVEF 30-35%, no RWMA, RV moderately reduced, moderate to severe concentric MR. Jardiance restarted on 1/28.  Patient reports that London Pepper and Sherryll Burger were stopped during last hospitalization at [facility], although both of these were filled on 08/04/23. Per care everywhere, it does look like Sherryll Burger was temporarily stopped due to acute renal failure during recent hospitalization 06/2023 at [facility]. Patient also with hypoglycemia during last hospitalization.   Patient reports feeling okay today. Remains on 2 L O2, but not on oxygen at home. Denies SOB. LEE still present in thighs, patient states abdominal bloating  feels a little improved today.   Current HF Medications: Diuretic: furosemide 80 mg IV BID Beta Blocker: metoprolol XL 25 mg daily SGLT2i: Jardiance 10 mg daily  Prior to admission HF Medications: Beta blocker: propranolol ER 160 mg daily ACE/ARB/ARNI: Entresto 24/26 mg BID SGLT2i: Jardiance 25 mg daily  Pertinent Lab Values: Serum creatinine 2.20, BUN 35, Potassium 4.5, Sodium 129, BNP >4500, Magnesium 2.1 1/25: A1c 7.6  Vital Signs: Weight: 176 lbs (admission weight: 164 lbs) - bed weights Blood pressure: 110-120s/50-60s  Heart rate: 60s-70s I/O: net +.04 L yesterday; net -1.7 L since admission  Medication Assistance / Insurance Benefits Check: Does the patient have prescription insurance?  Yes Type of insurance plan: Medicare + Medicaid  Outpatient Pharmacy:  Prior to admission outpatient pharmacy: [facility] Is the patient willing to use [facility] TOC pharmacy at discharge? Yes Is the patient willing to transition their outpatient pharmacy to utilize a [facility] outpatient pharmacy?   No    Assessment: 1. Acute on chronic HFmrEF (LVEF 30-35%), due to unclear etiology. NYHA class II symptoms. - Agree with increasing furosemide to 80 mg IV BID. Sub-optimal output on 60 mg IV BID. Patient diuresing slowly with continued signs of volume overload including abdominal bloating and LEE in thighs. Monitor renal function closely. - Continue metoprolol XL 25 mg daily  - Continue Jardiance 10 mg daily. Output has improved with addition of SGLT-2. - Continue to hold off on Entresto with AKI and lower BP - Daily weights, strict I/Os, keep K>4 and Mg>2   Plan: 1) Medication changes recommended at this time: - No changes at this time   2) Patient assistance: - Patient has Medicare +  Medicaid - Entresto/Jardiance $0   3)  Education  - Initial education completed, full education to be completed prior to discharge   Jarrett Ables, PharmD PGY-1 Pharmacy Resident  Sharen Hones,  PharmD, BCPS Heart Failure Stewardship Pharmacist Phone [phone]

## 2023-08-27 DIAGNOSIS — I5023 Acute on chronic systolic (congestive) heart failure: Secondary | ICD-10-CM | POA: Diagnosis not present

## 2023-08-27 DIAGNOSIS — J438 Other emphysema: Secondary | ICD-10-CM

## 2023-08-27 DIAGNOSIS — I10 Essential (primary) hypertension: Secondary | ICD-10-CM | POA: Diagnosis not present

## 2023-08-27 DIAGNOSIS — E1169 Type 2 diabetes mellitus with other specified complication: Secondary | ICD-10-CM | POA: Diagnosis not present

## 2023-08-27 LAB — BASIC METABOLIC PANEL
Anion gap: 9 (ref 5–15)
BUN: 37 mg/dL — ABNORMAL HIGH (ref 6–20)
CO2: 27 mmol/L (ref 22–32)
Calcium: 8.3 mg/dL — ABNORMAL LOW (ref 8.9–10.3)
Chloride: 98 mmol/L (ref 98–111)
Creatinine, Ser: 2.12 mg/dL — ABNORMAL HIGH (ref 0.44–1.00)
GFR, Estimated: 27 mL/min — ABNORMAL LOW (ref >60)
Glucose, Bld: 101 mg/dL — ABNORMAL HIGH (ref 70–99)
Potassium: 3.9 mmol/L (ref 3.5–5.1)
Sodium: 134 mmol/L — ABNORMAL LOW (ref 135–145)

## 2023-08-27 LAB — GLUCOSE, CAPILLARY
Glucose-Capillary: 115 mg/dL — ABNORMAL HIGH (ref 70–99)
Glucose-Capillary: 141 mg/dL — ABNORMAL HIGH (ref 70–99)
Glucose-Capillary: 94 mg/dL (ref 70–99)
Glucose-Capillary: 143 mg/dL — ABNORMAL HIGH (ref 70–99)

## 2023-08-27 LAB — CBC
HCT: 31.5 % — ABNORMAL LOW (ref 36.0–46.0)
Hemoglobin: 9.9 g/dL — ABNORMAL LOW (ref 12.0–15.0)
MCH: 29 pg (ref 26.0–34.0)
MCHC: 31.4 g/dL (ref 30.0–36.0)
MCV: 92.4 fL (ref 80.0–100.0)
Platelets: 178 10*3/uL (ref 150–400)
RBC: 3.41 MIL/uL — ABNORMAL LOW (ref 3.87–5.11)
RDW: 17.2 % — ABNORMAL HIGH (ref 11.5–15.5)
WBC: 5.6 10*3/uL (ref 4.0–10.5)
nRBC: 0 % (ref 0.0–0.2)

## 2023-08-27 MED ORDER — METOLAZONE 5 MG PO TABS
2.5000 mg | ORAL_TABLET | Freq: Once | ORAL | Status: AC
Start: 2023-08-27 — End: 2023-08-27
  Administered 2023-08-27: 2.5 mg via ORAL
  Filled 2023-08-27: qty 1

## 2023-08-27 NOTE — Care Management Important Message (Signed)
Important Message  Patient Details  Name: [name] MRN: [id number] Date of Birth: 15-Jun-1971   Important Message Given:  Yes - Medicare IM     Renie Ora 08/27/2023, 11:10 AM

## 2023-08-27 NOTE — Progress Notes (Signed)
Heart Failure Stewardship Pharmacist Progress Note   PCP: Royann Shivers, PA-C PCP-Cardiologist: Nona Dell, MD    HPI:  53 yo F with PMH of HFmrEF, frequent PVCs, severe PAD s/p bilateral AKA, tobacco use disorder, hyperlipidemia, anxiety, depression, COPD, hx of PE, type 2 diabetes. History of ECHO with EF 40% in 07/2021, improved to 55% in 07/2022. Last ECHO on 04/2023 with EF 45-50%, elevated LVEDP, normal RV function, small pericardial effusion, mild MR, mild aortic valve regurgitation. Has followed with EP as outpatient for PVCs, PACs, brief episodes of NSVT on long-term monitor. Felt not to be a candidate for ablation and her metoprolol was transitioned to propranolol.   Patient presented to [facility] ED on 1/24 with worsening SOB, abdominal distention, and LEE. Chest x-ray showed bilateral pleural effusions, BNP>7000. She was transferred to [facility] on 1/25. Once at [facility], BNP >4500. Patient somnolent with worsening wheezing on exam, noted she was given multiple doses of morphine in [place]. Narcan administered at [facility]. Also noted to be hypoglycemic, started on dextrose. Found to have acute respiratory and metabolic acidosis and was started on BiPAP. Initiated diuresis with IV lasix, home GDMT held due to hypotension and acute respiratory and metabolic acidosis. New ECHO 1/27 with LVEF 30-35%, no RWMA, RV moderately reduced, moderate to severe concentric MR. Jardiance restarted on 1/28.  Patient reports that London Pepper and Sherryll Burger were stopped during last hospitalization at [facility], although both of these were filled on 08/04/23. Per care everywhere, it does look like Sherryll Burger was temporarily stopped due to acute renal failure during recent hospitalization 06/2023 at [facility]. Patient also with hypoglycemia during last hospitalization.   Patient reports feeling okay today. Remains on 2 L O2, but not on oxygen at home. Denies SOB. LEE still present in thighs, patient states abdominal bloating  feels slightly improved today. She endorses worsening pain in her thighs.   Current HF Medications: Diuretic: furosemide 80 mg IV BID Beta Blocker: metoprolol XL 25 mg daily SGLT2i: Jardiance 10 mg daily  Prior to admission HF Medications: Beta blocker: propranolol ER 160 mg daily ACE/ARB/ARNI: Entresto 24/26 mg BID SGLT2i: Jardiance 25 mg daily  Pertinent Lab Values: Serum creatinine 2.20, BUN 35, Potassium 4.5, Sodium 129, BNP >4500, Magnesium 2.1 1/25: A1c 7.6  Vital Signs: Weight: 184 lbs (admission weight: 164 lbs) - bed weights Blood pressure: 110-120s/50-60s  Heart rate: 60s-70s I/O: net -2.0 L yesterday; net -2.8 L since admission  Medication Assistance / Insurance Benefits Check: Does the patient have prescription insurance?  Yes Type of insurance plan: Medicare + Medicaid  Outpatient Pharmacy:  Prior to admission outpatient pharmacy: [facility] Is the patient willing to use [facility] TOC pharmacy at discharge? Yes Is the patient willing to transition their outpatient pharmacy to utilize a [facility] outpatient pharmacy?   No    Assessment:  1. Acute on chronic HFmrEF (LVEF 30-35%), due to unclear etiology. NYHA class II symptoms. - Patient with slightly improved output on increased furosemide dose yesterday. She notes improvement in her abdominal edema, but thigh edema has been slow to improve. With renal function improving today will add a one time dose of metolazone to increase diuresis. Continue furosemide 80 mg IV BID. - Continue metoprolol XL 25 mg daily  - Continue Jardiance 10 mg daily. - Continue to hold off on Entresto with AKI and lower BP - Daily weights, strict I/Os, keep K>4 and Mg>2   Plan: 1) Medication changes recommended at this time: - Give metolazone 2.5 mg  x1 today   2) Patient assistance: - Patient has Medicare + Medicaid - Entresto/Jardiance $0   3)  Education  - Patient has been educated on current HF medications and potential additions to  HF medication regimen - Patient verbalizes understanding that over the next few months, these medication doses may change and more medications may be added to optimize HF regimen - Patient has been educated on basic disease state pathophysiology and goals of therapy   Jarrett Ables, PharmD PGY-1 Pharmacy Resident  Sharen Hones, PharmD, BCPS Heart Failure Stewardship Pharmacist Phone [phone]

## 2023-08-27 NOTE — Progress Notes (Signed)
Rounding Note    Patient Name: [name] Date of Encounter: 08/27/2023  [facility] Cardiologist: Nona Dell, MD   Subjective   No CP or dyspnea  Inpatient Medications    Scheduled Meds:  aspirin EC  81 mg Oral Daily   clopidogrel  75 mg Oral Daily   cyanocobalamin  1,000 mcg Oral Daily   DULoxetine  40 mg Oral Daily   empagliflozin  10 mg Oral Daily   furosemide  80 mg Intravenous Q12H   gabapentin  300 mg Oral TID   Gerhardt's butt cream   Topical TID   heparin  5,000 Units Subcutaneous Q8H   insulin aspart  0-9 Units Subcutaneous TID WC   insulin glargine-yfgn  8 Units Subcutaneous QHS   methocarbamol  500 mg Oral TID   metoprolol succinate  25 mg Oral Daily   nicotine  21 mg Transdermal Daily   pantoprazole  40 mg Oral Daily   rosuvastatin  40 mg Oral Daily   sodium chloride flush  3 mL Intravenous Q12H   Continuous Infusions:  PRN Meds: acetaminophen, ALPRAZolam, dextrose, docusate sodium, ipratropium-albuterol, naLOXone (NARCAN)  injection, polyethylene glycol, sodium chloride flush, traMADol   Vital Signs    Vitals:   08/26/23 1500 08/26/23 2007 08/27/23 0457 08/27/23 0731  BP: (!) 122/44 117/72 (!) 119/58 120/66  Pulse: 62 69 63 67  Resp: 16 16 16    Temp: 97.8 F (36.6 C) 97.6 F (36.4 C) 98.2 F (36.8 C) 97.7 F (36.5 C)  TempSrc: Oral Oral Oral Oral  SpO2: 100% 94% 94%   Weight:   83.6 kg   Height:        Intake/Output Summary (Last 24 hours) at 08/27/2023 0825 Last data filed at 08/27/2023 0805 Gross per 24 hour  Intake 920 ml  Output 2100 ml  Net -1180 ml      08/27/2023    4:57 AM 08/26/2023    5:26 AM 08/25/2023    3:47 AM  Last 3 Weights  Weight (lbs) 184 lb 4.9 oz 176 lb 5.9 oz 176 lb 5.9 oz  Weight (kg) 83.6 kg 80 kg 80 kg      Telemetry    Sinus with PVCs - Personally Reviewed   Physical Exam   GEN: NAD Neck: supple, no TM Cardiac: RRR, no rub Respiratory: CTA; no rhonchi GI: Soft, NT/ND, no  masses MS: Status post bilateral AKA with 1+ edema in her stumps Neuro:  grossly intact Psych: Normal affect   Labs     Chemistry Recent Labs  Lab 08/22/23 0353 08/23/23 0410 08/24/23 0431 08/25/23 0513 08/26/23 0345  NA 130*   < > 126* 128* 129*  K 4.3   < > 4.4 4.0 4.5  CL 99   < > 97* 94* 96*  CO2 22   < > 20* 23 20*  GLUCOSE 53*   < > 307* 172* 190*  BUN 18   < > 21* 29* 35*  CREATININE 1.76*   < > 2.03* 2.20* 2.20*  CALCIUM 8.5*   < > 8.0* 8.1* 8.2*  MG 2.3  --   --  1.9 2.1  PROT 5.7*  --   --   --   --   ALBUMIN 2.8*  --   --   --   --   AST 28  --   --   --   --   ALT 13  --   --   --   --  ALKPHOS 177*  --   --   --   --   BILITOT 0.8  --   --   --   --   GFRNONAA 34*   < > 29* 26* 26*  ANIONGAP 9   < > 9 11 13    < > = values in this interval not displayed.     Hematology Recent Labs  Lab 08/23/23 0410 08/24/23 0431 08/26/23 1630  WBC 6.6 6.3 5.7  RBC 3.38* 3.19* 3.23*  HGB 10.1* 9.5* 9.5*  HCT 30.9* 29.4* 29.8*  MCV 91.4 92.2 92.3  MCH 29.9 29.8 29.4  MCHC 32.7 32.3 31.9  RDW 17.0* 17.2* 17.2*  PLT 193 198 166   Thyroid  Recent Labs  Lab 08/22/23 0059  TSH 4.790*    BNP Recent Labs  Lab 08/21/23 2233  BNP >4,500.0*      Radiology    No results found.    Patient Profile     53 year old female with past medical history of chronic combined systolic/diastolic congestive heart failure, frequent PVCs, peripheral vascular disease status post bilateral AKA, tobacco abuse, COPD, prior pulmonary embolus, chronic stage IIIa kidney disease, diabetes mellitus for evaluation of acute on chronic combined systolic/diastolic congestive heart failure. Nuclear study February 2024 showed ejection fraction 55% and no ischemia or infarction. Follow-up echocardiogram shows ejection fraction 30 to 35%, moderate RV dysfunction, severe left atrial enlargement, moderate right atrial enlargement, moderate to severe mitral regurgitation, trace aortic  insufficiency, mild aortic stenosis with mean gradient 4 mmHg and aortic valve area calculated 1.64 cm.  Assessment & Plan    1 acute on chronic combined systolic/diastolic congestive heart failure-symptoms have improved but continues with significant edema.  Will continue Lasix and Jardiance.  Note renal function is pending this morning.     2 cardiomyopathy-LV function is worse on most recent echocardiogram.  Entresto was held due to borderline blood pressure and worsening renal function.  Will consider resuming later if blood pressure/renal function allow.  Will continue Toprol for now.   3 history of frequent PVCs-continue low-dose Toprol.  Will increase after CHF improves.   4 peripheral vascular disease-continue aspirin and statin.   5 chronic stage IIIa kidney disease-await follow-up bmet today.   6 hyponatremia-FU Na pending.   7 tobacco abuse-patient previously counseled on discontinuing.   8 chest pain-patient has not had chest pain since admission.  Not clear that she would be a good candidate for aggressive cardiac evaluation.  Will consider outpatient stress test once CHF improves.    9 moderate to severe mitral regurgitation-will need follow-up as an outpatient.  Not clear that she would be a candidate for aggressive intervention.  For questions or updates, please contact [facility] Please consult [email or web address] for contact info under        Signed, Olga Millers, MD  08/27/2023, 8:25 AM

## 2023-08-27 NOTE — Progress Notes (Signed)
   08/27/23 2106  BiPAP/CPAP/SIPAP  $ Non-Invasive Ventilator  Non-Invasive Vent Subsequent  BiPAP/CPAP/SIPAP Pt Type Adult  BiPAP/CPAP/SIPAP SERVO  Reason BIPAP/CPAP not in use Non-compliant (pt refused)  BiPAP/CPAP /SiPAP Vitals  Pulse Rate 77  Resp 13  BP 122/81  SpO2 93 %  Bilateral Breath Sounds Clear;Diminished  MEWS Score/Color  MEWS Score 1  MEWS Score Color [place]

## 2023-08-27 NOTE — Progress Notes (Signed)
[facility]                                                                              [name], is a 53 y.o. female, DOB - Feb 04, 1971, ZOX:[id number] Admit date - 08/21/2023    Outpatient Primary MD for the patient is Royann Shivers, PA-C  LOS - 6  days  No chief complaint on file.      Brief summary   Patient is a 53 year old female with CHF, CAD, diabetes mellitus type 2, HTN, bilateral BKA and CKD stage 3 presented with progressive shortness of breath, abdominal distention, lower extremity edema.   She was evaluated at [facility] ED, she was diagnosed with volume overload and was transferred to [facility] for further treatment and diagnosis.  At the time of her transfer she was somnolent, blood pressure was 94/61, HR 80, RR 12 and 02 saturation 93% on 3 L/min per [place].  BNP > 4500, creatinine 1.7 Chest x-ray showed cardiomegaly with bilateral hilar vascular congestion, cephalization of the vasculature, bilateral pleural effusions.  Significant events 01/26: Lethargic, hypoglycemia, started on BiPAP, acute respiratory and metabolic acidosis  01/27: Persistent hypoglycemia, respiratory acidosis improved, no lactic acidosis.  Placed on D5, then D10 infusion  01/28: mentation back to baseline, D10 discontinued.  On diuresis.  01/30: Clinically improving   Assessment & Plan    Principal Problem:   Acute on chronic systolic and diastolic combined CHF (congestive heart failure) (HCC) -2D echo showed EF 30 to 35%, RV systolic function with moderate reduction, LA with severe dilation, RA with moderate dilation, moderate to severe MR, aortic valve stenosis, trivial pericardial effusion -Cardiology consulted, still has volume overload -Continue IV Lasix 80 mg twice daily, strict I's and O's and daily weights. -negative balance of 2.3 L,  -Continue Jardiance, Toprol-XL -Continue to hold Entresto, amlodipine -Caution with SGLT 2 inh due to recovering from severe  metabolic and respiratory acidosis.    Acute hypoxemic and hypercapnic respiratory failure due to cardiogenic pulmonary edema. -Improved ventilation with BiPAP on admission -Now off, O2 sat is 93 to 100% on room air    Acute on CKD (chronic kidney disease) stage 3b, GFR 30-59 ml/min (HCC) Baseline creatinine 1.5 - Renal US with no hydronephrosis, with small amount of free fluid in the right upper quadrant.  -Creatinine plateaued at 2.2, improving to 2.1 today, monitor with IV 6   Hypertension -BP stable, continue diuresis, metoprolol    Type 2 diabetes mellitus with hyperlipidemia (HCC) Hypoglycemia may have acute metabolic encephalopathy -Initially hypoglycemia requiring D5 and D10 infusion.    -Now CBGs elevated CBG (last 3)  Recent Labs    08/26/23 2115 08/27/23 0728 08/27/23 1155  GLUCAP 200* 115* 141*   -Continue Semglee 8 units nightly, SSI sensitive    Diabetic neuropathy, -Continue Neurontin 300 mg 3 times daily, Robaxin, Cymbalta -Continue tramadol   COPD (chronic obstructive pulmonary disease) (HCC) No signs of acute exacerbation, continue bronchodilator therapy.    Estimated body mass index is 29.75 kg/m as calculated from the following:   Height as of this encounter: 5\' 6"  (1.676 m).   Weight as of  this encounter: 83.6 kg.  Code Status: full  DVT Prophylaxis:  heparin injection 5,000 Units Start: 08/21/23 2315   Level of Care: Level of care: Progressive Family Communication: Updated patient's sister on the phone Disposition Plan:      Remains inpatient appropriate:      Procedures:   Consultants:   Cardiology  Antimicrobials:   Anti-infectives (From admission, onward)    None          Medications  aspirin EC  81 mg Oral Daily   clopidogrel  75 mg Oral Daily   cyanocobalamin  1,000 mcg Oral Daily   DULoxetine  40 mg Oral Daily   empagliflozin  10 mg Oral Daily   furosemide  80 mg Intravenous Q12H   gabapentin  300 mg Oral TID    Gerhardt's butt cream   Topical TID   heparin  5,000 Units Subcutaneous Q8H   insulin aspart  0-9 Units Subcutaneous TID WC   insulin glargine-yfgn  8 Units Subcutaneous QHS   methocarbamol  500 mg Oral TID   metoprolol succinate  25 mg Oral Daily   nicotine  21 mg Transdermal Daily   pantoprazole  40 mg Oral Daily   rosuvastatin  40 mg Oral Daily   sodium chloride flush  3 mL Intravenous Q12H      Subjective:   [name] was seen and examined today.  No acute plaints.  No chest pain, shortness of breath, fevers.  Still somewhat volume overloaded with edema on the stumps  Objective:   Vitals:   08/27/23 0457 08/27/23 0731 08/27/23 1159 08/27/23 1200  BP: (!) 119/58 120/66 108/68   Pulse: 63 67 95   Resp: 16   15  Temp: 98.2 F (36.8 C) 97.7 F (36.5 C) 97.8 F (36.6 C)   TempSrc: Oral Oral Oral   SpO2: 94%   93%  Weight: 83.6 kg     Height:        Intake/Output Summary (Last 24 hours) at 08/27/2023 1513 Last data filed at 08/27/2023 1415 Gross per 24 hour  Intake 1400 ml  Output 2550 ml  Net -1150 ml     Wt Readings from Last 3 Encounters:  08/27/23 83.6 kg  04/30/23 86.6 kg  12/01/22 86.6 kg    Physical Exam General: Alert and oriented x 3, NAD Cardiovascular: S1 S2 clear, RRR.  Respiratory: CTAB, no wheezing Gastrointestinal: Soft, nontender, nondistended, NBS Ext: 1+ edema bilaterally stumps  Neuro: no new deficits Psych: Normal affect       Data Reviewed:  I have personally reviewed following labs    CBC Lab Results  Component Value Date   WBC 5.6 08/27/2023   RBC 3.41 (L) 08/27/2023   HGB 9.9 (L) 08/27/2023   HCT 31.5 (L) 08/27/2023   MCV 92.4 08/27/2023   MCH 29.0 08/27/2023   PLT 178 08/27/2023   MCHC 31.4 08/27/2023   RDW 17.2 (H) 08/27/2023   LYMPHSABS 2.0 08/24/2023   MONOABS 0.6 08/24/2023   EOSABS 0.1 08/24/2023   BASOSABS 0.1 08/24/2023     Last metabolic panel Lab Results  Component Value Date   NA 134 (L)  08/27/2023   K 3.9 08/27/2023   CL 98 08/27/2023   CO2 27 08/27/2023   BUN 37 (H) 08/27/2023   CREATININE 2.12 (H) 08/27/2023   GLUCOSE 101 (H) 08/27/2023   GFRNONAA 27 (L) 08/27/2023   GFRAA 77 (L) 09/06/2014   CALCIUM 8.3 (L) 08/27/2023   PHOS 3.3 01/29/2012  PROT 5.7 (L) 08/22/2023   ALBUMIN 2.8 (L) 08/22/2023   BILITOT 0.8 08/22/2023   ALKPHOS 177 (H) 08/22/2023   AST 28 08/22/2023   ALT 13 08/22/2023   ANIONGAP 9 08/27/2023    CBG (last 3)  Recent Labs    08/26/23 2115 08/27/23 0728 08/27/23 1155  GLUCAP 200* 115* 141*      Coagulation Profile: No results for input(s): "INR", "PROTIME" in the last 168 hours.   Radiology Studies: I have personally reviewed the imaging studies  No results found.      Thad Ranger M.D. [facility] 08/27/2023, 3:13 PM  Available via Epic secure chat 7am-7pm After 7 pm, please refer to night coverage provider listed on amion.

## 2023-08-28 DIAGNOSIS — I10 Essential (primary) hypertension: Secondary | ICD-10-CM | POA: Diagnosis not present

## 2023-08-28 DIAGNOSIS — N183 Chronic kidney disease, stage 3 unspecified: Secondary | ICD-10-CM | POA: Diagnosis not present

## 2023-08-28 DIAGNOSIS — E1169 Type 2 diabetes mellitus with other specified complication: Secondary | ICD-10-CM | POA: Diagnosis not present

## 2023-08-28 DIAGNOSIS — I5023 Acute on chronic systolic (congestive) heart failure: Secondary | ICD-10-CM | POA: Diagnosis not present

## 2023-08-28 LAB — BASIC METABOLIC PANEL
Anion gap: 11 (ref 5–15)
BUN: 39 mg/dL — ABNORMAL HIGH (ref 6–20)
CO2: 26 mmol/L (ref 22–32)
Calcium: 8.3 mg/dL — ABNORMAL LOW (ref 8.9–10.3)
Chloride: 97 mmol/L — ABNORMAL LOW (ref 98–111)
Creatinine, Ser: 2.02 mg/dL — ABNORMAL HIGH (ref 0.44–1.00)
GFR, Estimated: 29 mL/min — ABNORMAL LOW (ref >60)
Glucose, Bld: 96 mg/dL (ref 70–99)
Potassium: 3.9 mmol/L (ref 3.5–5.1)
Sodium: 134 mmol/L — ABNORMAL LOW (ref 135–145)

## 2023-08-28 LAB — GLUCOSE, CAPILLARY
Glucose-Capillary: 94 mg/dL (ref 70–99)
Glucose-Capillary: 123 mg/dL — ABNORMAL HIGH (ref 70–99)
Glucose-Capillary: 147 mg/dL — ABNORMAL HIGH (ref 70–99)
Glucose-Capillary: 80 mg/dL (ref 70–99)
Glucose-Capillary: 139 mg/dL — ABNORMAL HIGH (ref 70–99)

## 2023-08-28 LAB — CBC
HCT: 28.7 % — ABNORMAL LOW (ref 36.0–46.0)
Hemoglobin: 9.4 g/dL — ABNORMAL LOW (ref 12.0–15.0)
MCH: 30 pg (ref 26.0–34.0)
MCHC: 32.8 g/dL (ref 30.0–36.0)
MCV: 91.7 fL (ref 80.0–100.0)
Platelets: 179 10*3/uL (ref 150–400)
RBC: 3.13 MIL/uL — ABNORMAL LOW (ref 3.87–5.11)
RDW: 17.3 % — ABNORMAL HIGH (ref 11.5–15.5)
WBC: 6.6 10*3/uL (ref 4.0–10.5)
nRBC: 0 % (ref 0.0–0.2)

## 2023-08-28 MED ORDER — DEXTROSE 50 % IV SOLN: 1.0000 | INTRAVENOUS | Status: DC | PRN

## 2023-08-28 MED ORDER — MEXILETINE HCL 150 MG PO CAPS
150.0000 mg | ORAL_CAPSULE | Freq: Three times a day (TID) | ORAL | Status: DC
  Administered 2023-08-28 – 2023-08-29 (×4): 150 mg via ORAL
  Filled 2023-08-28 (×5): qty 1

## 2023-08-28 NOTE — Progress Notes (Signed)
His RN reported that blood glucose is low 80.  Patient is also very sleepy.  Rectal wanting to hold the Semglee 8 units for tonight.  As patient is already very sleepy recommending to hold the gabapentin and Robaxin as well.   Tereasa Coop, MD [facility] 08/28/2023, 10:01 PM

## 2023-08-28 NOTE — Progress Notes (Signed)
[facility]                                                                              [name], is a 53 y.o. female, DOB - 02-15-1971, ZDG:[id number] Admit date - 08/21/2023    Outpatient Primary MD for the patient is Royann Shivers, PA-C  LOS - 7  days  No chief complaint on file.      Brief summary   Patient is a 53 year old female with CHF, CAD, diabetes mellitus type 2, HTN, bilateral BKA and CKD stage 3 presented with progressive shortness of breath, abdominal distention, lower extremity edema.   She was evaluated at [facility] ED, she was diagnosed with volume overload and was transferred to [facility] for further treatment and diagnosis.  At the time of her transfer she was somnolent, blood pressure was 94/61, HR 80, RR 12 and 02 saturation 93% on 3 L/min per [place].  BNP > 4500, creatinine 1.7 Chest x-ray showed cardiomegaly with bilateral hilar vascular congestion, cephalization of the vasculature, bilateral pleural effusions.  Significant events 01/26: Lethargic, hypoglycemia, started on BiPAP, acute respiratory and metabolic acidosis  01/27: Persistent hypoglycemia, respiratory acidosis improved, no lactic acidosis.  Placed on D5, then D10 infusion  01/28: mentation back to baseline, D10 discontinued.  On diuresis.  01/30: Clinically improving   Assessment & Plan    Principal Problem:   Acute on chronic systolic and diastolic combined CHF (congestive heart failure) (HCC) -2D echo showed EF 30 to 35%, RV systolic function with moderate reduction, LA with severe dilation, RA with moderate dilation, moderate to severe MR, aortic valve stenosis, trivial pericardial effusion -Cardiology consulted, still has volume overload -Continue Jardiance, Toprol-XL -Continue to hold Entresto, amlodipine, GI BMs -Continue Lasix 80 mg IV twice daily, negative balance of 3.1 L.  Still has volume overload.   Acute hypoxemic and hypercapnic respiratory failure due  to cardiogenic pulmonary edema. -Improved ventilation with BiPAP on admission -Currently on 2 L    Acute on CKD (chronic kidney disease) stage 3b, GFR 30-59 ml/min (HCC) Baseline creatinine 1.5 - Renal US with no hydronephrosis, with small amount of free fluid in the right upper quadrant.  -Creatinine improving, 2.0    Hypertension -BP stable, continue diuresis, metoprolol    Type 2 diabetes mellitus with hyperlipidemia (HCC) Hypoglycemia may have acute metabolic encephalopathy -Initially hypoglycemia requiring D5 and D10 infusion.    -Now CBGs elevated CBG (last 3)  Recent Labs    08/27/23 2117 08/28/23 0722 08/28/23 1159  GLUCAP 143* 94 123*   -Continue Semglee 8 units nightly, SSI sensitive    Diabetic neuropathy, -Continue Neurontin 300 mg 3 times daily, Robaxin, Cymbalta -Continue tramadol   COPD (chronic obstructive pulmonary disease) (HCC) No signs of acute exacerbation, continue bronchodilator therapy.    Estimated body mass index is 28.29 kg/m as calculated from the following:   Height as of this encounter: 5\' 6"  (1.676 m).   Weight as of this encounter: 79.5 kg.  Code Status: full  DVT Prophylaxis:  heparin injection 5,000 Units Start: 08/21/23 2315   Level of Care: Level of care: Progressive Family Communication:  Disposition Plan:      Remains inpatient appropriate:      Procedures:   Consultants:   Cardiology  Antimicrobials:   Anti-infectives (From admission, onward)    None          Medications  aspirin EC  81 mg Oral Daily   clopidogrel  75 mg Oral Daily   cyanocobalamin  1,000 mcg Oral Daily   DULoxetine  40 mg Oral Daily   empagliflozin  10 mg Oral Daily   furosemide  80 mg Intravenous Q12H   gabapentin  300 mg Oral TID   Gerhardt's butt cream   Topical TID   heparin  5,000 Units Subcutaneous Q8H   insulin aspart  0-9 Units Subcutaneous TID WC   insulin glargine-yfgn  8 Units Subcutaneous QHS   methocarbamol  500 mg Oral  TID   metoprolol succinate  25 mg Oral Daily   mexiletine  150 mg Oral Q8H   nicotine  21 mg Transdermal Daily   pantoprazole  40 mg Oral Daily   rosuvastatin  40 mg Oral Daily   sodium chloride flush  3 mL Intravenous Q12H      Subjective:   [name] was seen and examined today.  Somewhat sleepy but easily arousable, denies any specific complaints.  Still has volume overload with edema on the stumps.  No chest pain or acute shortness of breath.  Objective:   Vitals:   08/27/23 2300 08/28/23 0417 08/28/23 0720 08/28/23 1200  BP:   120/68 111/67  Pulse:   68 61  Resp: 13   14  Temp:   98.2 F (36.8 C) 98.5 F (36.9 C)  TempSrc:   Axillary Axillary  SpO2:    99%  Weight: 79.5 kg 79.5 kg    Height:        Intake/Output Summary (Last 24 hours) at 08/28/2023 1430 Last data filed at 08/28/2023 0656 Gross per 24 hour  Intake 720 ml  Output 1500 ml  Net -780 ml     Wt Readings from Last 3 Encounters:  08/28/23 79.5 kg  04/30/23 86.6 kg  12/01/22 86.6 kg   Physical Exam General: Sleepy but easily arousable, Cardiovascular: S1 S2 clear, RRR.  Respiratory: CTAB, no wheezing, rales or rhonchi Gastrointestinal: Soft, nontender, nondistended, NBS Ext: 1+ edema bilaterally stumps  Neuro: no new deficits Psych: somnolent     Data Reviewed:  I have personally reviewed following labs    CBC Lab Results  Component Value Date   WBC 6.6 08/28/2023   RBC 3.13 (L) 08/28/2023   HGB 9.4 (L) 08/28/2023   HCT 28.7 (L) 08/28/2023   MCV 91.7 08/28/2023   MCH 30.0 08/28/2023   PLT 179 08/28/2023   MCHC 32.8 08/28/2023   RDW 17.3 (H) 08/28/2023   LYMPHSABS 2.0 08/24/2023   MONOABS 0.6 08/24/2023   EOSABS 0.1 08/24/2023   BASOSABS 0.1 08/24/2023     Last metabolic panel Lab Results  Component Value Date   NA 134 (L) 08/28/2023   K 3.9 08/28/2023   CL 97 (L) 08/28/2023   CO2 26 08/28/2023   BUN 39 (H) 08/28/2023   CREATININE 2.02 (H) 08/28/2023   GLUCOSE 96  08/28/2023   GFRNONAA 29 (L) 08/28/2023   GFRAA 77 (L) 09/06/2014   CALCIUM 8.3 (L) 08/28/2023   PHOS 3.3 01/29/2012   PROT 5.7 (L) 08/22/2023   ALBUMIN 2.8 (L) 08/22/2023   BILITOT 0.8 08/22/2023   ALKPHOS 177 (H) 08/22/2023   AST 28  08/22/2023   ALT 13 08/22/2023   ANIONGAP 11 08/28/2023    CBG (last 3)  Recent Labs    08/27/23 2117 08/28/23 0722 08/28/23 1159  GLUCAP 143* 94 123*      Coagulation Profile: No results for input(s): "INR", "PROTIME" in the last 168 hours.   Radiology Studies: I have personally reviewed the imaging studies  No results found.      Thad Ranger M.D. [facility] 08/28/2023, 2:30 PM  Available via Epic secure chat 7am-7pm After 7 pm, please refer to night coverage provider listed on amion.

## 2023-08-28 NOTE — Progress Notes (Signed)
   Patient Name: [name] Date of Encounter: 08/28/2023 [facility] Cardiologist: Nona Dell, MD   Interval Summary  .    No acute overnight events. Net - . Reports diffuse pain and discomfort.   Vital Signs .    Vitals:   08/27/23 2106 08/27/23 2300 08/28/23 0417 08/28/23 0720  BP: 122/81   120/68  Pulse: 77   68  Resp: 13 13    Temp:    98.2 F (36.8 C)  TempSrc:    Axillary  SpO2: 93%     Weight:  79.5 kg 79.5 kg   Height:        Intake/Output Summary (Last 24 hours) at 08/28/2023 0744 Last data filed at 08/28/2023 0656 Gross per 24 hour  Intake 1680 ml  Output 2300 ml  Net -620 ml      08/28/2023    4:17 AM 08/27/2023   11:00 PM 08/27/2023    4:57 AM  Last 3 Weights  Weight (lbs) 175 lb 4.3 oz 175 lb 4.3 oz 184 lb 4.9 oz  Weight (kg) 79.5 kg 79.5 kg 83.6 kg      Telemetry/ECG    Sinus with frequent PVCs - Personally Reviewed  Physical Exam .   GEN: No acute distress.   Neck: JVD elevated Cardiac: Normal rate, irregular rhythm Respiratory: Normal work of breathing GI: Soft, non-distended  MS: S/p bilateral AKA, 1+ edema  Assessment & Plan .     Ms. Jeriyah Granlund is a 53 year old female with past medical history of chronic combined systolic/diastolic congestive heart failure, frequent PVCs, peripheral vascular disease status post bilateral AKA, tobacco abuse, COPD, prior pulmonary embolus, chronic stage IIIa kidney disease, diabetes mellitus who is admitted for acute on chronic combined systolic/diastolic congestive heart failure.   Nuclear study February 2024 showed ejection fraction 55% and no ischemia or infarction. Follow-up echocardiogram this admission shows new drop in LV ejection fraction 30 to 35%, moderate RV dysfunction, severe left atrial enlargement, moderate right atrial enlargement, moderate to severe mitral regurgitation, trace aortic insufficiency, mild aortic stenosis with mean gradient 4 mmHg and aortic valve area  calculated 1.64 cm.  #Acute on chronic combined systolic and diastolic heart failure:  - Continue diuresis. Still with edema and elevated JVD. Kidney function stable.   #. Frequent PVCs: Could be contributing to her cardiomyopathy.  - Start mexiletine 150mg  TID.   #. Peripheral arterial disease:  - Continue aspirin and statin.   #. CKD stage IIIa:  - Creatinine improved to 2.02 today from 2.12 yesterday. Continue diuresis for now.  For questions or updates, please contact  [facility] Please consult [email or web address] for contact info under        Signed, Nobie Putnam, MD

## 2023-08-29 DIAGNOSIS — E1169 Type 2 diabetes mellitus with other specified complication: Secondary | ICD-10-CM | POA: Diagnosis not present

## 2023-08-29 DIAGNOSIS — I5023 Acute on chronic systolic (congestive) heart failure: Secondary | ICD-10-CM | POA: Diagnosis not present

## 2023-08-29 DIAGNOSIS — N183 Chronic kidney disease, stage 3 unspecified: Secondary | ICD-10-CM | POA: Diagnosis not present

## 2023-08-29 DIAGNOSIS — I10 Essential (primary) hypertension: Secondary | ICD-10-CM | POA: Diagnosis not present

## 2023-08-29 LAB — GLUCOSE, CAPILLARY
Glucose-Capillary: 162 mg/dL — ABNORMAL HIGH (ref 70–99)
Glucose-Capillary: 164 mg/dL — ABNORMAL HIGH (ref 70–99)
Glucose-Capillary: 125 mg/dL — ABNORMAL HIGH (ref 70–99)
Glucose-Capillary: 120 mg/dL — ABNORMAL HIGH (ref 70–99)

## 2023-08-29 LAB — CBC
HCT: 29.9 % — ABNORMAL LOW (ref 36.0–46.0)
Hemoglobin: 9.5 g/dL — ABNORMAL LOW (ref 12.0–15.0)
MCH: 29.3 pg (ref 26.0–34.0)
MCHC: 31.8 g/dL (ref 30.0–36.0)
MCV: 92.3 fL (ref 80.0–100.0)
Platelets: 181 10*3/uL (ref 150–400)
RBC: 3.24 MIL/uL — ABNORMAL LOW (ref 3.87–5.11)
RDW: 17.3 % — ABNORMAL HIGH (ref 11.5–15.5)
WBC: 6.8 10*3/uL (ref 4.0–10.5)
nRBC: 0 % (ref 0.0–0.2)

## 2023-08-29 LAB — BASIC METABOLIC PANEL
Anion gap: 11 (ref 5–15)
BUN: 43 mg/dL — ABNORMAL HIGH (ref 6–20)
CO2: 28 mmol/L (ref 22–32)
Calcium: 8.4 mg/dL — ABNORMAL LOW (ref 8.9–10.3)
Chloride: 92 mmol/L — ABNORMAL LOW (ref 98–111)
Creatinine, Ser: 2.09 mg/dL — ABNORMAL HIGH (ref 0.44–1.00)
GFR, Estimated: 28 mL/min — ABNORMAL LOW (ref >60)
Glucose, Bld: 160 mg/dL — ABNORMAL HIGH (ref 70–99)
Potassium: 3.7 mmol/L (ref 3.5–5.1)
Sodium: 131 mmol/L — ABNORMAL LOW (ref 135–145)

## 2023-08-29 MED ORDER — GABAPENTIN 100 MG PO CAPS
100.0000 mg | ORAL_CAPSULE | Freq: Three times a day (TID) | ORAL | Status: DC
  Administered 2023-08-29 – 2023-09-03 (×16): 100 mg via ORAL
  Filled 2023-08-29 (×16): qty 1

## 2023-08-29 MED ORDER — DULOXETINE HCL 30 MG PO CPEP
30.0000 mg | ORAL_CAPSULE | Freq: Every day | ORAL | Status: DC
  Administered 2023-08-29 – 2023-09-03 (×6): 30 mg via ORAL
  Filled 2023-08-29 (×6): qty 1

## 2023-08-29 MED ORDER — METHOCARBAMOL 500 MG PO TABS
500.0000 mg | ORAL_TABLET | Freq: Three times a day (TID) | ORAL | Status: DC | PRN
  Administered 2023-08-30 – 2023-09-03 (×6): 500 mg via ORAL
  Filled 2023-08-29 (×6): qty 1

## 2023-08-29 MED ORDER — MEXILETINE HCL 200 MG PO CAPS
200.0000 mg | ORAL_CAPSULE | Freq: Two times a day (BID) | ORAL | Status: DC
  Administered 2023-08-29 – 2023-09-03 (×10): 200 mg via ORAL
  Filled 2023-08-29 (×11): qty 1

## 2023-08-29 NOTE — Progress Notes (Signed)
[facility]                                                                              [name], is a 53 y.o. female, DOB - 1971/06/10, OZD:[id number] Admit date - 08/21/2023    Outpatient Primary MD for the patient is Royann Shivers, PA-C  LOS - 8  days  No chief complaint on file.      Brief summary   Patient is a 53 year old female with CHF, CAD, diabetes mellitus type 2, HTN, bilateral BKA and CKD stage 3 presented with progressive shortness of breath, abdominal distention, lower extremity edema.   She was evaluated at [facility] ED, she was diagnosed with volume overload and was transferred to [facility] for further treatment and diagnosis.  At the time of her transfer she was somnolent, blood pressure was 94/61, HR 80, RR 12 and 02 saturation 93% on 3 L/min per [place].  BNP > 4500, creatinine 1.7 Chest x-ray showed cardiomegaly with bilateral hilar vascular congestion, cephalization of the vasculature, bilateral pleural effusions.  Significant events 01/26: Lethargic, hypoglycemia, started on BiPAP, acute respiratory and metabolic acidosis  01/27: Persistent hypoglycemia, respiratory acidosis improved, no lactic acidosis.  Placed on D5, then D10 infusion  01/28: mentation back to baseline, D10 discontinued.  On diuresis.  01/30: Clinically improving   Assessment & Plan    Principal Problem:   Acute on chronic systolic and diastolic combined CHF (congestive heart failure) (HCC) -2D echo showed EF 30 to 35%, RV systolic function with moderate reduction, LA with severe dilation, RA with moderate dilation, moderate to severe MR, aortic valve stenosis, trivial pericardial effusion -Continue Jardiance, Toprol-XL -Continue to hold Entresto, amlodipine  -Continue diuresis, on IV Lasix 80 mg q12 hours, negative balance of 3.4 L.  Cardiology following. -Possibly also third spacing, RV failure, will check albumin, LFTs   Acute hypoxemic and hypercapnic  respiratory failure due to cardiogenic pulmonary edema. -Improved ventilation with BiPAP on admission -Currently on 2 L    Acute on CKD (chronic kidney disease) stage 3b, GFR 30-59 ml/min (HCC) Baseline creatinine 1.5 - Renal US with no hydronephrosis, with small amount of free fluid in the right upper quadrant.  -Creatinine stable at 2.0   Hypertension -BP stable, continue diuresis, metoprolol    Type 2 diabetes mellitus with hyperlipidemia (HCC) Hypoglycemia may have acute metabolic encephalopathy -Initially hypoglycemia requiring D5 and D10 infusion.  CBG (last 3)  Recent Labs    08/28/23 2141 08/28/23 2329 08/29/23 0733  GLUCAP 80 139* 162*   -Continue Semglee 8 units nightly, SSI sensitive  Acute metabolic encephalopathy, diabetic neuropathy -patient noted to be somewhat somnolent, ?  Medication effect -Decrease Neurontin to 100 mg 3 times daily, change Robaxin to as needed, decrease Cymbalta to 30 mg daily -Continue tramadol as needed    COPD (chronic obstructive pulmonary disease) (HCC) No signs of acute exacerbation, continue bronchodilator therapy.    Estimated body mass index is 27.58 kg/m as calculated from the following:   Height as of this encounter: 5\' 6"  (1.676 m).   Weight as of this encounter: 77.5 kg.  Code Status: full  DVT Prophylaxis:  heparin injection 5,000 Units Start: 08/21/23 2315   Level of Care: Level of care: Progressive Family Communication:  Disposition Plan:      Remains inpatient appropriate:      Procedures:   Consultants:   Cardiology  Antimicrobials:   Anti-infectives (From admission, onward)    None          Medications  aspirin EC  81 mg Oral Daily   clopidogrel  75 mg Oral Daily   cyanocobalamin  1,000 mcg Oral Daily   DULoxetine  30 mg Oral Daily   empagliflozin  10 mg Oral Daily   furosemide  80 mg Intravenous Q12H   gabapentin  100 mg Oral TID   Gerhardt's butt cream   Topical TID   heparin  5,000  Units Subcutaneous Q8H   insulin aspart  0-9 Units Subcutaneous TID WC   metoprolol succinate  25 mg Oral Daily   mexiletine  200 mg Oral Q12H   nicotine  21 mg Transdermal Daily   pantoprazole  40 mg Oral Daily   rosuvastatin  40 mg Oral Daily   sodium chloride flush  3 mL Intravenous Q12H      Subjective:   [name] was seen and examined today.  Still has volume overload, noted somewhat sleepy but arousable, working on her breakfast during encounter.  No chest pain or acute shortness of breath.  Objective:   Vitals:   08/28/23 1200 08/28/23 2051 08/29/23 0325 08/29/23 1022  BP: 111/67 122/65 110/72 (!) 121/49  Pulse: 61 68 76 69  Resp: 14 12 13    Temp: 98.5 F (36.9 C) 98.6 F (37 C) 97.8 F (36.6 C)   TempSrc: Axillary Oral Oral   SpO2: 99% 97% 96%   Weight:   77.5 kg   Height:        Intake/Output Summary (Last 24 hours) at 08/29/2023 1204 Last data filed at 08/29/2023 0600 Gross per 24 hour  Intake 300 ml  Output 600 ml  Net -300 ml     Wt Readings from Last 3 Encounters:  08/29/23 77.5 kg  04/30/23 86.6 kg  12/01/22 86.6 kg   Physical Exam General: Sleepy but easily arousable and appropriately responds to questions Cardiovascular: S1 S2 clear, RRR.  JVD Respiratory: CTAB, no wheezing Gastrointestinal: Soft, nontender, nondistended, NBS Ext: 1+  edema bilaterally on stumps Neuro: no new deficits Psych: Normal affect   Data Reviewed:  I have personally reviewed following labs    CBC Lab Results  Component Value Date   WBC 6.8 08/29/2023   RBC 3.24 (L) 08/29/2023   HGB 9.5 (L) 08/29/2023   HCT 29.9 (L) 08/29/2023   MCV 92.3 08/29/2023   MCH 29.3 08/29/2023   PLT 181 08/29/2023   MCHC 31.8 08/29/2023   RDW 17.3 (H) 08/29/2023   LYMPHSABS 2.0 08/24/2023   MONOABS 0.6 08/24/2023   EOSABS 0.1 08/24/2023   BASOSABS 0.1 08/24/2023     Last metabolic panel Lab Results  Component Value Date   NA 131 (L) 08/29/2023   K 3.7 08/29/2023   CL  92 (L) 08/29/2023   CO2 28 08/29/2023   BUN 43 (H) 08/29/2023   CREATININE 2.09 (H) 08/29/2023   GLUCOSE 160 (H) 08/29/2023   GFRNONAA 28 (L) 08/29/2023   GFRAA 77 (L) 09/06/2014   CALCIUM 8.4 (L) 08/29/2023   PHOS 3.3 01/29/2012   PROT 5.7 (L) 08/22/2023   ALBUMIN 2.8 (L) 08/22/2023   BILITOT 0.8 08/22/2023  ALKPHOS 177 (H) 08/22/2023   AST 28 08/22/2023   ALT 13 08/22/2023   ANIONGAP 11 08/29/2023    CBG (last 3)  Recent Labs    08/28/23 2141 08/28/23 2329 08/29/23 0733  GLUCAP 80 139* 162*      Coagulation Profile: No results for input(s): "INR", "PROTIME" in the last 168 hours.   Radiology Studies: I have personally reviewed the imaging studies  No results found.      Thad Ranger M.D. [facility] 08/29/2023, 12:04 PM  Available via Epic secure chat 7am-7pm After 7 pm, please refer to night coverage provider listed on amion.

## 2023-08-29 NOTE — Progress Notes (Addendum)
   Patient Name: [name] Date of Encounter: 08/29/2023 [facility] Cardiologist: Nona Dell, MD   Interval Summary  .    No acute overnight events. No accurate I&Os charted for past 24 hours. Reports sleepiness.  Vital Signs .    Vitals:   08/28/23 0720 08/28/23 1200 08/28/23 2051 08/29/23 0325  BP: 120/68 111/67 122/65 110/72  Pulse: 68 61 68 76  Resp:  14 12 13   Temp: 98.2 F (36.8 C) 98.5 F (36.9 C) 98.6 F (37 C) 97.8 F (36.6 C)  TempSrc: Axillary Axillary Oral Oral  SpO2:  99% 97% 96%  Weight:    77.5 kg  Height:        Intake/Output Summary (Last 24 hours) at 08/29/2023 0750 Last data filed at 08/29/2023 0600 Gross per 24 hour  Intake 300 ml  Output 600 ml  Net -300 ml      08/29/2023    3:25 AM 08/28/2023    4:17 AM 08/27/2023   11:00 PM  Last 3 Weights  Weight (lbs) 170 lb 13.7 oz 175 lb 4.3 oz 175 lb 4.3 oz  Weight (kg) 77.5 kg 79.5 kg 79.5 kg      Telemetry/ECG    Sinus with frequent PVCs - Personally Reviewed  Physical Exam .   GEN: No acute distress.   Neck: JVD elevated Cardiac: Normal rate, irregular rhythm Respiratory: Normal work of breathing GI: Soft, non-distended  MS: S/p bilateral AKA, 1-2+ edema  Assessment & Plan .     [name] is a 53 year old female with past medical history of chronic combined systolic/diastolic congestive heart failure, frequent PVCs, peripheral vascular disease status post bilateral AKA, tobacco abuse, COPD, prior pulmonary embolus, chronic stage IIIa kidney disease, diabetes mellitus who is admitted for acute on chronic combined systolic/diastolic congestive heart failure.   Nuclear study February 2024 showed ejection fraction 55% and no ischemia or infarction. Follow-up echocardiogram this admission shows new drop in LV ejection fraction 30 to 35%, moderate RV dysfunction, severe left atrial enlargement, moderate right atrial enlargement, moderate to severe mitral regurgitation, trace  aortic insufficiency, mild aortic stenosis with mean gradient 4 mmHg and aortic valve area calculated 1.64 cm.  #Acute on chronic combined systolic and diastolic heart failure:  - Continue diuresis. Still with edema and elevated JVD. Kidney function stable at Cr ~2 since 1/28.   #. Frequent PVCs: Could be contributing to her cardiomyopathy.  - Continue with mexiletine for now. If no improvement and PVCs are felt to be the driver of her cardiomyopathy then can stop mexiletine and start amiodarone. Would first need ischemic evaluation to determine whether drop in EF can be attributed to PVCs.  #. Peripheral arterial disease:  - Continue aspirin and statin.   #. CKD stage IIIa:  - Creatinine improved to 2.02 today from 2.12 yesterday. Continue diuresis for now.  For questions or updates, please contact [facility] Please consult [email or web address] for contact info under        Signed, Nobie Putnam, MD

## 2023-08-30 DIAGNOSIS — N183 Chronic kidney disease, stage 3 unspecified: Secondary | ICD-10-CM | POA: Diagnosis not present

## 2023-08-30 DIAGNOSIS — E1169 Type 2 diabetes mellitus with other specified complication: Secondary | ICD-10-CM | POA: Diagnosis not present

## 2023-08-30 DIAGNOSIS — I5023 Acute on chronic systolic (congestive) heart failure: Secondary | ICD-10-CM | POA: Diagnosis not present

## 2023-08-30 DIAGNOSIS — I10 Essential (primary) hypertension: Secondary | ICD-10-CM | POA: Diagnosis not present

## 2023-08-30 LAB — CBC
HCT: 29.5 % — ABNORMAL LOW (ref 36.0–46.0)
Hemoglobin: 9.5 g/dL — ABNORMAL LOW (ref 12.0–15.0)
MCH: 29.5 pg (ref 26.0–34.0)
MCHC: 32.2 g/dL (ref 30.0–36.0)
MCV: 91.6 fL (ref 80.0–100.0)
Platelets: 184 10*3/uL (ref 150–400)
RBC: 3.22 MIL/uL — ABNORMAL LOW (ref 3.87–5.11)
RDW: 17.2 % — ABNORMAL HIGH (ref 11.5–15.5)
WBC: 6.4 10*3/uL (ref 4.0–10.5)
nRBC: 0 % (ref 0.0–0.2)

## 2023-08-30 LAB — HEPATIC FUNCTION PANEL
ALT: 9 U/L (ref 0–44)
AST: 21 U/L (ref 15–41)
Albumin: 2.4 g/dL — ABNORMAL LOW (ref 3.5–5.0)
Alkaline Phosphatase: 92 U/L (ref 38–126)
Bilirubin, Direct: 0.3 mg/dL — ABNORMAL HIGH (ref 0.0–0.2)
Indirect Bilirubin: 0.4 mg/dL (ref 0.3–0.9)
Total Bilirubin: 0.7 mg/dL (ref 0.0–1.2)
Total Protein: 5.3 g/dL — ABNORMAL LOW (ref 6.5–8.1)

## 2023-08-30 LAB — BASIC METABOLIC PANEL
Anion gap: 11 (ref 5–15)
BUN: 43 mg/dL — ABNORMAL HIGH (ref 6–20)
CO2: 29 mmol/L (ref 22–32)
Calcium: 8.4 mg/dL — ABNORMAL LOW (ref 8.9–10.3)
Chloride: 91 mmol/L — ABNORMAL LOW (ref 98–111)
Creatinine, Ser: 2.17 mg/dL — ABNORMAL HIGH (ref 0.44–1.00)
GFR, Estimated: 26 mL/min — ABNORMAL LOW (ref >60)
Glucose, Bld: 116 mg/dL — ABNORMAL HIGH (ref 70–99)
Potassium: 3.5 mmol/L (ref 3.5–5.1)
Sodium: 131 mmol/L — ABNORMAL LOW (ref 135–145)

## 2023-08-30 LAB — GLUCOSE, CAPILLARY
Glucose-Capillary: 120 mg/dL — ABNORMAL HIGH (ref 70–99)
Glucose-Capillary: 271 mg/dL — ABNORMAL HIGH (ref 70–99)
Glucose-Capillary: 194 mg/dL — ABNORMAL HIGH (ref 70–99)
Glucose-Capillary: 211 mg/dL — ABNORMAL HIGH (ref 70–99)

## 2023-08-30 MED ORDER — POTASSIUM CHLORIDE CRYS ER 20 MEQ PO TBCR
40.0000 meq | EXTENDED_RELEASE_TABLET | Freq: Once | ORAL | Status: AC
  Administered 2023-08-30: 40 meq via ORAL
  Filled 2023-08-30: qty 2

## 2023-08-30 MED ORDER — METOLAZONE 5 MG PO TABS
2.5000 mg | ORAL_TABLET | Freq: Once | ORAL | Status: AC
Start: 2023-08-30 — End: 2023-08-30
  Administered 2023-08-30: 2.5 mg via ORAL
  Filled 2023-08-30: qty 1

## 2023-08-30 NOTE — TOC Initial Note (Signed)
Transition of Care [facility]) - Initial/Assessment Note   Patient Details  Name: [name] MRN: [id number] Date of Birth: [birth year]  Transition of Care [facility] CM/SW Contact:    Delilah Shan, LCSWA Phone Number: 08/30/2023, 4:53 PM  Clinical Narrative:                  CSW received consult for possible SNF placement at time of discharge. CSW spoke with patient regarding PT recommendation of SNF placement at time of discharge. Patient reports PTA she comes from home alone.Patient expressed understanding of PT recommendation and is agreeable to SNF placement at time of discharge. Patient gave CSW permission to fax out initial referral for SNF placement.CSW discussed insurance authorization process .No further questions reported at this time. CSW to continue to follow and assist with discharge planning needs.  Expected Discharge Plan: Home/Self Care Barriers to Discharge: Continued Medical Work up   Patient Goals and CMS Choice Patient states their goals for this hospitalization and ongoing recovery are:: Patient will return home once stable.          Expected Discharge Plan and Services In-house Referral: NA Discharge Planning Services: CM Consult   Living arrangements for the past 2 months: Single Family Home                                      Prior Living Arrangements/Services Living arrangements for the past 2 months: Single Family Home Lives with:: Self Patient language and need for interpreter reviewed:: Yes Do you feel safe going back to the place where you live?: Yes      Need for Family Participation in Patient Care: Yes (Comment) Care giver support system in place?: Yes (comment) Current home services: DME (shower chair.) Criminal Activity/Legal Involvement Pertinent to Current Situation/Hospitalization: No - Comment as needed  Activities of Daily Living   ADL Screening (condition at time of admission) Independently performs ADLs?: Yes (appropriate for  developmental age) Is the patient deaf or have difficulty hearing?: No Does the patient have difficulty seeing, even when wearing glasses/contacts?: No Does the patient have difficulty concentrating, remembering, or making decisions?: No  Permission Sought/Granted Permission sought to share information with : Case Manager, Family Supports                Emotional Assessment Appearance:: Appears stated age Attitude/Demeanor/Rapport: Engaged Affect (typically observed): Appropriate Orientation: : Oriented to Place, Oriented to  Time, Oriented to Self, Oriented to Situation Alcohol / Substance Use: Not Applicable Psych Involvement: No (comment)  Admission diagnosis:  CHF (congestive heart failure) (HCC) [I50.9] Patient Active Problem List   Diagnosis Date Noted   Type 2 diabetes mellitus with hyperlipidemia (HCC) 08/22/2023   COPD (chronic obstructive pulmonary disease) (HCC) 08/22/2023   Obesity (BMI 30-39.9) 08/22/2023   Acute on chronic systolic CHF (congestive heart failure) (HCC) 08/21/2023   Abnormal CT scan, small bowel 06/18/2023   Impingement syndrome of left shoulder 10/22/2022   Debility 10/01/2021   CKD (chronic kidney disease) stage 3, GFR 30-59 ml/min (HCC) 09/25/2021   Hyponatremia 09/25/2021   Leukocytosis 09/25/2021   PAD (peripheral artery disease) (HCC) 09/24/2021   COPD not affecting current episode of care 09/24/2021   Tobacco dependence 09/24/2021   Nausea with vomiting 03/12/2016   Dysphagia, pharyngoesophageal phase 08/24/2014   Constipation 04/20/2014   Liver abscess 02/21/2014   RUQ pain 01/31/2014   Septic shock (HCC) 02/05/2012  Hypokalemia 02/05/2012   Normocytic anemia 02/05/2012   Acute respiratory failure with hypoxia (HCC) 01/28/2012   Appendicitis 01/28/2012   ARDS (adult respiratory distress syndrome) (HCC) 01/28/2012   Diabetes mellitus (HCC) 01/28/2012   Asthma 01/28/2012   Hypertension 01/28/2012   PCP:  Royann Shivers,  PA-C Pharmacy:   [facility] [name] - [address] [name] [address] Phone: [phone] Fax: [phone]  [facility] - [place], [place] - [facility] to Registered [address] [place] [address] Phone: [phone] Fax: [phone]  [facility] [address] [name] [address] Phone: [phone] Fax: [phone]     Social Drivers of Health (SDOH) Social History: SDOH Screenings   Food Insecurity: No Food Insecurity (08/23/2023)  Housing: Low Risk  (08/23/2023)  Transportation Needs: No Transportation Needs (08/23/2023)  Utilities: Not At Risk (08/23/2023)  Alcohol Screen: Low Risk  (03/18/2022)  Depression (PHQ2-9): Medium Risk (03/18/2022)  Financial Resource Strain: Low Risk  (07/23/2023)   Received from [facility]  Physical Activity: Inactive (07/23/2023)   Received from [facility]  Social Connections: Socially Isolated (07/23/2023)   [facility] from [facility]  Stress: No Stress Concern Present (07/23/2023)   [facility] from [facility]  Tobacco Use: High Risk (08/24/2023)  Health Literacy: Inadequate Health Literacy (08/04/2023)   Received from [facility]   SDOH Interventions:     Readmission Risk Interventions     No data to display

## 2023-08-30 NOTE — Evaluation (Signed)
Occupational Therapy Evaluation Patient Details Name: [name] MRN: [id number] DOB: [birth year] Today's Date: 08/30/2023   History of Present Illness The pt is a 53 yo female presenting 1/25 from outside hospital with SOB and edema. Admitted for management of CHF exacerbation. Hospital course complicated by hypoglycemia, acute respiratory and metabolic acidosis, subsequently started on BiPAP 1/27. PMH includes: anemia, anxiety, depression, CKD III, COPD, DM II, HTN, PAD s/p bilateral AKA, PE on Xarelto, PVC.   Clinical Impression   Patient admitted for the diagnosis above.  PTA she apparently lived at home with what sounds like frequent check ins from her son, and possibly other family members.   Patient states she has had a functional decline over the last few weeks, and currently cannot transfer out of the bed.  Patient admits to spending all day in the bed.  Currently she presents with weakness, lethargy, poor insight into deficits, poor activity tolerance and poor balance, all of which impact her ADL and mobility status.  Patient is unable to effectively transfer herself without +2 for assist and safety, and needs up to Mod A at bedlevel for ADL completion.  Patient wants to return home, but unless she has the needed 24 hour Mod A at home, Patient will benefit from continued inpatient follow up therapy, <3 hours/day.      If plan is discharge home, recommend the following: Assist for transportation;Assistance with cooking/housework;A lot of help with walking and/or transfers;A lot of help with bathing/dressing/bathroom    Functional Status Assessment  Patient has had a recent decline in their functional status and demonstrates the ability to make significant improvements in function in a reasonable and predictable amount of time.  Equipment Recommendations  None recommended by OT    Recommendations for Other Services       Precautions / Restrictions Precautions Precautions:  Fall Precaution Comments: bilateral AKA Restrictions Weight Bearing Restrictions Per Provider Order: No      Mobility Bed Mobility Overal bed mobility: Needs Assistance Bed Mobility: Rolling Rolling: Supervision, Used rails         General bed mobility comments: able to long sit in bed without assist, difficulty shifting her weight in bed and moving to EOB    Transfers                          Balance Overall balance assessment: Needs assistance Sitting-balance support: Bilateral upper extremity supported Sitting balance-Leahy Scale: Fair                                     ADL either performed or assessed with clinical judgement   ADL Overall ADL's : Needs assistance/impaired Eating/Feeding: Set up;Bed level   Grooming: Wash/dry hands;Wash/dry face;Set up;Bed level   Upper Body Bathing: Set up;Bed level   Lower Body Bathing: Moderate assistance;Bed level   Upper Body Dressing : Minimal assistance;Bed level   Lower Body Dressing: Moderate assistance;Bed level                       Vision Patient Visual Report: No change from baseline       Perception Perception: Not tested       Praxis Praxis: Not tested       Pertinent Vitals/Pain Pain Assessment Pain Assessment: Faces Faces Pain Scale: Hurts little more Pain Location: bilateral legs, back , and neck Pain  Descriptors / Indicators: Aching Pain Intervention(s): Monitored during session     Extremity/Trunk Assessment Upper Extremity Assessment Upper Extremity Assessment: Generalized weakness;LUE deficits/detail LUE Deficits / Details: decreased end range to L shoulder flexion LUE Sensation: WNL LUE Coordination: WNL   Lower Extremity Assessment Lower Extremity Assessment: Defer to PT evaluation   Cervical / Trunk Assessment Cervical / Trunk Assessment: Other exceptions Cervical / Trunk Exceptions: large body habitus   Communication  Communication Communication: No apparent difficulties   Cognition Arousal: Lethargic Behavior During Therapy: WFL for tasks assessed/performed Overall Cognitive Status: No family/caregiver present to determine baseline cognitive functioning Area of Impairment: Following commands                       Following Commands: Follows one step commands consistently     Problem Solving: Slow processing, Requires verbal cues       General Comments   VSS on RA    Exercises     Shoulder Instructions      Home Living Family/patient expects to be discharged to:: Private residence Living Arrangements: Alone Available Help at Discharge: Family;Available PRN/intermittently Type of Home: House Home Access: Ramped entrance     Home Layout: One level     Bathroom Shower/Tub: Tub/shower unit;Sponge bathes at baseline   Bathroom Toilet: Standard Bathroom Accessibility: No   Home Equipment: Shower seat;Wheelchair - power          Prior Functioning/Environment Prior Level of Function : Needs assist             Mobility Comments: pt reports mostly bed bound, too weak to use slideboard to power WC since last d/c ADLs Comments: uses bedpan in bed and dumps into bucket, family comes a few times each day to empty and provide food.  Patient latly has been preforming ADL bedlevel, can long sit in bed without assist.        OT Problem List: Decreased strength;Decreased range of motion;Decreased activity tolerance;Impaired balance (sitting and/or standing);Decreased safety awareness;Pain      OT Treatment/Interventions: Self-care/ADL training;Therapeutic activities;Patient/family education;Balance training;DME and/or AE instruction    OT Goals(Current goals can be found in the care plan section) Acute Rehab OT Goals Patient Stated Goal: Return home OT Goal Formulation: With patient Time For Goal Achievement: 09/13/23 Potential to Achieve Goals: Fair ADL Goals Pt Will  Perform Grooming: with set-up;sitting Pt Will Perform Upper Body Bathing: with set-up;sitting Pt Will Perform Lower Body Bathing: with set-up;sitting/lateral leans;bed level Pt Will Perform Upper Body Dressing: with set-up;sitting Pt Will Perform Lower Body Dressing: with set-up;sitting/lateral leans;bed level Additional ADL Goal #1: CGA to perform A/P transfers to/from recliner to increase independence with toileting.  OT Frequency: Min 1X/week    Co-evaluation              AM-PAC OT "6 Clicks" Daily Activity     Outcome Measure Help from another person eating meals?: None Help from another person taking care of personal grooming?: None Help from another person toileting, which includes using toliet, bedpan, or urinal?: A Lot Help from another person bathing (including washing, rinsing, drying)?: A Lot Help from another person to put on and taking off regular upper body clothing?: A Little Help from another person to put on and taking off regular lower body clothing?: A Lot 6 Click Score: 17   End of Session Nurse Communication: Mobility status  Activity Tolerance: Patient limited by lethargy Patient left: in bed;with call bell/phone within reach  OT Visit  Diagnosis: Muscle weakness (generalized) (M62.81)                Time: 1438-1500 OT Time Calculation (min): 22 min Charges:  OT General Charges $OT Visit: 1 Visit OT Evaluation $OT Eval Moderate Complexity: 1 Mod  08/30/2023  RP, OTR/L  Acute Rehabilitation Services  Office:  [phone]   Suzanna Obey 08/30/2023, 3:17 PM

## 2023-08-30 NOTE — Evaluation (Signed)
Physical Therapy Evaluation Patient Details Name: [name] MRN: [id number] DOB: [birth year] Today's Date: 08/30/2023  History of Present Illness  The pt is a 53 yo female presenting 1/25 from outside hospital with SOB and edema. Admitted for management of CHF exacerbation. Hospital course complicated by hypoglycemia, acute respiratory and metabolic acidosis, subsequently started on BiPAP 1/27. PMH includes: anemia, anxiety, depression, CKD III, COPD, DM II, HTN, PAD s/p bilateral AKA, PE on Xarelto, PVC.   Clinical Impression  Pt in bed upon arrival of PT, agreeable to evaluation at this time. Prior to admission the pt reports she was living at home alone, spending all of her time in bed as she had been unable to complete her usual slideboard transfer to [facility] (even with her son's assistance), and was using bed pan in the bed. She was inconsistent with reports of the amount of assistance available at home and wether or not she was active with HHPT prior to this admission. The pt required minA to scoot to EOB and was unable to move at all along EOB without maxA from PT. Will continue to benefit from skilled PT acutely to progress functional UE and core strength for scooting transfers, but if pt unable to transfer to [facility], I feel it would be best to attend inpatient rehab <3hours/day prior to return home. Pt has verbalized preference for home with HHPT, would need supervision/assist available 24/7, max HHPT, an aide, and possibly hoyer lift and drop-arm BSC to make d/c home safer at this time.  Will benefit from OT for further assessment of her capacity for ADLs, medication management, and cognition.         If plan is discharge home, recommend the following: Two people to help with walking and/or transfers;Two people to help with bathing/dressing/bathroom;Direct supervision/assist for medications management;Direct supervision/assist for financial management;Assist for transportation;Help with stairs or  ramp for entrance   Can travel by private vehicle   No    Equipment Recommendations Other (comment) (possibly hoyer lift if pt d/c home)  Recommendations for Other Services  OT consult    Functional Status Assessment Patient has had a recent decline in their functional status and demonstrates the ability to make significant improvements in function in a reasonable and predictable amount of time.     Precautions / Restrictions Precautions Precautions: Fall Precaution Comments: bilateral AKA Restrictions Weight Bearing Restrictions Per Provider Order: No      Mobility  Bed Mobility Overal bed mobility: Needs Assistance Bed Mobility: Rolling, Supine to Sit, Sit to Supine Rolling: Supervision, Used rails   Supine to sit: Min assist, HOB elevated, Used rails Sit to supine: Min assist   General bed mobility comments: pt able to roll well with use of bed rails, long sit without assist but assist to scoot to EOB    Transfers Overall transfer level: Needs assistance   Transfers: Bed to chair/wheelchair/BSC            Lateral/Scoot Transfers: Max assist General transfer comment: pt with little ability to generate hip clearance for scooting, even when cued for wt shift and offweighting. maxA with use of bed pad to scoot laterally      Balance Overall balance assessment: Needs assistance Sitting-balance support: Bilateral upper extremity supported Sitting balance-Leahy Scale: Fair Sitting balance - Comments: leans forwards outside BOS and laterally to each elbow with supervision  Pertinent Vitals/Pain Pain Assessment Pain Assessment: Faces Pain Score: 8  Faces Pain Scale: Hurts little more Pain Location: bilateral legs, back , and neck Pain Descriptors / Indicators: Discomfort, Grimacing Pain Intervention(s): Limited activity within patient's tolerance, Monitored during session, Repositioned    Home Living  Family/patient expects to be discharged to:: Private residence Living Arrangements: Alone Available Help at Discharge: Family;Available PRN/intermittently Type of Home: House Home Access: Ramped entrance       Home Layout: One level Home Equipment: Shower seat;Wheelchair - power (Film/video editor)      Prior Function Prior Level of Function : Needs assist             Mobility Comments: pt reports mostly bed bound, too weak to use slideboard to power WC since last d/c ADLs Comments: uses bedpan in bed and dumps into bucket, family comes a few times each day to empty and provide food     Extremity/Trunk Assessment   Upper Extremity Assessment Upper Extremity Assessment: Generalized weakness (grossly 3+/5 but ROM WFL, unable to use functionally for scooting in bed)    Lower Extremity Assessment Lower Extremity Assessment: Generalized weakness (hx of AKA, limited ROM bilaterally, unable to generate more than 3/5 to MMT at hips)    Cervical / Trunk Assessment Cervical / Trunk Assessment: Other exceptions Cervical / Trunk Exceptions: large body habitus  Communication   Communication Communication: No apparent difficulties Cueing Techniques: Verbal cues  Cognition Arousal: Alert Behavior During Therapy: Flat affect Overall Cognitive Status: Impaired/Different from baseline Area of Impairment: Safety/judgement, Awareness, Problem solving                         Safety/Judgement: Decreased awareness of safety Awareness: Intellectual Problem Solving: Slow processing, Difficulty sequencing, Requires verbal cues General Comments: no family present to confirm baseline, pt with inconsistent answers to PLOF questions through session (first says her son stops by, then that he is with her 8am-4pm everyt day) pt needing cues for technique and problem solving. not formally assessed        General Comments General comments (skin integrity, edema, etc.): BP soft but stable, SpO2  stable on RA    Exercises General Exercises - Lower Extremity Hip ABduction/ADduction: AROM, Both, 10 reps Other Exercises Other Exercises: sidelying hip extension x5 Other Exercises: sitting chair push ups (in bed) x5 Other Exercises: sitting cross-body reaches x10   Assessment/Plan    PT Assessment Patient needs continued PT services  PT Problem List Decreased strength;Decreased range of motion;Decreased activity tolerance;Decreased mobility;Decreased balance;Decreased safety awareness;Obesity       PT Treatment Interventions DME instruction;Functional mobility training;Therapeutic activities;Therapeutic exercise;Balance training;Wheelchair mobility training;Patient/family education    PT Goals (Current goals can be found in the Care Plan section)  Acute Rehab PT Goals Patient Stated Goal: to return home, not need SNF PT Goal Formulation: With patient Time For Goal Achievement: 09/13/23 Potential to Achieve Goals: Fair    Frequency Min 1X/week        AM-PAC PT "6 Clicks" Mobility  Outcome Measure Help needed turning from your back to your side while in a flat bed without using bedrails?: None Help needed moving from lying on your back to sitting on the side of a flat bed without using bedrails?: A Little Help needed moving to and from a bed to a chair (including a wheelchair)?: A Lot Help needed standing up from a chair using your arms (e.g., wheelchair or bedside chair)?: Total Help needed to  walk in hospital room?: Total Help needed climbing 3-5 steps with a railing? : Total 6 Click Score: 12    End of Session   Activity Tolerance: Patient tolerated treatment well;Patient limited by fatigue Patient left: in bed;with call bell/phone within reach;with bed alarm set Nurse Communication: Mobility status PT Visit Diagnosis: Other abnormalities of gait and mobility (R26.89);Muscle weakness (generalized) (M62.81)    Time: 2841-3244 PT Time Calculation (min) (ACUTE  ONLY): 30 min   Charges:   PT Evaluation $PT Eval Moderate Complexity: 1 Mod PT Treatments $Therapeutic Activity: 8-22 mins PT General Charges $$ ACUTE PT VISIT: 1 Visit         Vickki Muff, PT, DPT   Acute Rehabilitation Department Office [phone] Secure Chat Communication Preferred  Ronnie Derby 08/30/2023, 11:40 AM

## 2023-08-30 NOTE — Progress Notes (Signed)
Heart Failure Stewardship Pharmacist Progress Note   PCP: Royann Shivers, PA-C PCP-Cardiologist: Nona Dell, MD    HPI:  53 yo F with PMH of HFmrEF, frequent PVCs, severe PAD s/p bilateral AKA, tobacco use disorder, hyperlipidemia, anxiety, depression, COPD, hx of PE, type 2 diabetes. History of ECHO with EF 40% in 07/2021, improved to 55% in 07/2022. Last ECHO on 04/2023 with EF 45-50%, elevated LVEDP, normal RV function, small pericardial effusion, mild MR, mild aortic valve regurgitation. Has followed with EP as outpatient for PVCs, PACs, brief episodes of NSVT on long-term monitor. Felt not to be a candidate for ablation and her metoprolol was transitioned to propranolol.   Patient presented to [facility] ED on 1/24 with worsening SOB, abdominal distention, and LEE. Chest x-ray showed bilateral pleural effusions, BNP>7000. She was transferred to [facility] on 1/25. Once at [facility], BNP >4500. Patient somnolent with worsening wheezing on exam, noted she was given multiple doses of morphine in [place]. Narcan administered at [facility]. Also noted to be hypoglycemic, started on dextrose. Found to have acute respiratory and metabolic acidosis and was started on BiPAP. Initiated diuresis with IV lasix, home GDMT held due to hypotension and acute respiratory and metabolic acidosis. New ECHO 1/27 with LVEF 30-35%, no RWMA, RV moderately reduced, moderate to severe concentric MR. Jardiance restarted on 1/28.  Patient reports that London Pepper and Sherryll Burger were stopped during last hospitalization at [facility], although both of these were filled on 08/04/23. Per care everywhere, it does look like Sherryll Burger was temporarily stopped due to acute renal failure during recent hospitalization 06/2023 at [facility]. Patient also with hypoglycemia during last hospitalization.   Patient reports feeling okay today. Off oxygen. Not on supplemental oxygen at home. Denies SOB at rest but still has some SOB on exertion. LEE still present in  thighs and complaining of abdominal edema. Feels like the lasix has been very slow to remove her excess fluid.    Current HF Medications: Diuretic: furosemide 80 mg IV BID Beta Blocker: metoprolol XL 25 mg daily SGLT2i: Jardiance 10 mg daily  Prior to admission HF Medications: Beta blocker: propranolol ER 160 mg daily ACE/ARB/ARNI: Entresto 24/26 mg BID SGLT2i: Jardiance 25 mg daily  Pertinent Lab Values: Serum creatinine 2.17, BUN 43, Potassium 3.5, Sodium 131, BNP >4500, Magnesium 2.1, A1c 7.6  Vital Signs: Weight: 162 lbs (admission weight: 164 lbs) - bed weights Blood pressure: 110-120/50-60s  Heart rate: 60s I/O: net -1.5 L yesterday; net -5 L since admission  Medication Assistance / Insurance Benefits Check: Does the patient have prescription insurance?  Yes Type of insurance plan: Medicare + Medicaid  Outpatient Pharmacy:  Prior to admission outpatient pharmacy: [facility] Is the patient willing to use [facility] TOC pharmacy at discharge? Yes Is the patient willing to transition their outpatient pharmacy to utilize a [facility] outpatient pharmacy?   No    Assessment:  1. Acute on chronic HFmrEF (LVEF 30-35%), due to unclear etiology. NYHA class II symptoms. - Continue furosemide 80 mg IV BID. Output increased on 1/31 with the addition of metolazone. Creatinine relatively stable. Consider addition of metolazone 2.5 mg again today.  - Continue metoprolol XL 25 mg daily  - Continue Jardiance 10 mg daily - Continue to hold off on Entresto with AKI and lower BP - Daily weights, strict I/Os, keep K>4 and Mg>2   Plan: 1) Medication changes recommended at this time: - Metolazone 2.5 mg x1 today - KCl 40 mEq x1  2) Patient assistance: -  Patient has Medicare + Medicaid - Entresto/Jardiance $0  3)  Education  - Patient has been educated on current HF medications and potential additions to HF medication regimen - Patient verbalizes understanding that over the next few months,  these medication doses may change and more medications may be added to optimize HF regimen - Patient has been educated on basic disease state pathophysiology and goals of therapy   Sharen Hones, PharmD, BCPS Heart Failure Stewardship Pharmacist Phone [phone]

## 2023-08-30 NOTE — Care Management Important Message (Signed)
Important Message  Patient Details  Name: [name] MRN: [id number] Date of Birth: 12-10-1970   Important Message Given:  Yes - Medicare IM     Renie Ora 08/30/2023, 10:32 AM

## 2023-08-30 NOTE — Progress Notes (Signed)
[facility]                                                                              [name], is a 53 y.o. female, DOB - 1970-10-22, ZOX:[id number] Admit date - 08/21/2023    Outpatient Primary MD for the patient is Royann Shivers, PA-C  LOS - 9  days  No chief complaint on file.      Brief summary   Patient is a 53 year old female with CHF, CAD, diabetes mellitus type 2, HTN, bilateral BKA and CKD stage 3 presented with progressive shortness of breath, abdominal distention, lower extremity edema.   She was evaluated at [facility] ED, she was diagnosed with volume overload and was transferred to [facility] for further treatment and diagnosis.  At the time of her transfer she was somnolent, blood pressure was 94/61, HR 80, RR 12 and 02 saturation 93% on 3 L/min per [place].  BNP > 4500, creatinine 1.7 Chest x-ray showed cardiomegaly with bilateral hilar vascular congestion, cephalization of the vasculature, bilateral pleural effusions.  Significant events 01/26: Lethargic, hypoglycemia, started on BiPAP, acute respiratory and metabolic acidosis  01/27: Persistent hypoglycemia, respiratory acidosis improved, no lactic acidosis.  Placed on D5, then D10 infusion  01/28: mentation back to baseline, D10 discontinued.  On diuresis.  01/30: Clinically improving   Assessment & Plan    Principal Problem:   Acute on chronic systolic and diastolic combined CHF (congestive heart failure) (HCC) Moderate to severe mitral regurgitation, mild aortic insufficiency -2D echo showed EF 30 to 35%, RV systolic function with moderate reduction, LA with severe dilation, RA with moderate dilation, moderate to severe MR, aortic valve stenosis, trivial pericardial effusion -Continue Jardiance, Toprol-XL -Continue to hold Entresto, amlodipine  -Cardiology following.  Renal limits aggressive diuresis.  Still has anasarca, continue IV Lasix 80 mg every 12 hours - per cardiology, adding  metolazone 2.5 mg p.o. x 1.     Acute hypoxemic and hypercapnic respiratory failure due to cardiogenic pulmonary edema. -Improved ventilation with BiPAP on admission -Currently on 2 L  Frequent PVCs -Followed by EP cardiology, placed on Toprol-XL this admission - started on mexiletine on 2/1    Acute on CKD (chronic kidney disease) stage 3b, GFR 30-59 ml/min (HCC) -Baseline creatinine 1.5 - Renal US with no hydronephrosis, with small amount of free fluid in the right upper quadrant.  -Creatinine 2.1   Hypertension -BP stable, continue diuresis, metoprolol    Type 2 diabetes mellitus with hyperlipidemia (HCC) Hypoglycemia may have acute metabolic encephalopathy -Initially hypoglycemia requiring D5 and D10 infusion.  CBG (last 3)  Recent Labs    08/29/23 2111 08/30/23 0747 08/30/23 1127  GLUCAP 120* 120* 271*   -Continue sliding scale insulin sensitive   Acute metabolic encephalopathy, diabetic neuropathy -Improving -Decreased Neurontin to 100 mg 3 times daily, change Robaxin to as needed, decrease Cymbalta to 30 mg daily -Continue tramadol as needed  PAD -Continue aspirin, statin    COPD (chronic obstructive pulmonary disease) (HCC) No signs of acute exacerbation, continue bronchodilator therapy.    Estimated body mass index is 26.16 kg/m as calculated from the following:  Height as of this encounter: 5\' 6"  (1.676 m).   Weight as of this encounter: 73.5 kg.  Code Status: full  DVT Prophylaxis:  heparin injection 5,000 Units Start: 08/21/23 2315   Level of Care: Level of care: Progressive Family Communication:  Disposition Plan:      Remains inpatient appropriate:      Procedures:   Consultants:   Cardiology  Antimicrobials:   Anti-infectives (From admission, onward)    None          Medications  aspirin EC  81 mg Oral Daily   clopidogrel  75 mg Oral Daily   cyanocobalamin  1,000 mcg Oral Daily   DULoxetine  30 mg Oral Daily    empagliflozin  10 mg Oral Daily   furosemide  80 mg Intravenous Q12H   gabapentin  100 mg Oral TID   Gerhardt's butt cream   Topical TID   heparin  5,000 Units Subcutaneous Q8H   insulin aspart  0-9 Units Subcutaneous TID WC   metolazone  2.5 mg Oral Once   metoprolol succinate  25 mg Oral Daily   mexiletine  200 mg Oral Q12H   nicotine  21 mg Transdermal Daily   pantoprazole  40 mg Oral Daily   potassium chloride  40 mEq Oral Once   rosuvastatin  40 mg Oral Daily   sodium chloride flush  3 mL Intravenous Q12H      Subjective:   Ieasha [name] was seen and examined today.  Shortness of breath improving, still has abdominal swelling, anasarca.  No chest pain or acute shortness of breath.    Objective:   Vitals:   08/29/23 2330 08/30/23 0349 08/30/23 0745 08/30/23 1128  BP: (!) 117/51 125/66  (!) 127/53  Pulse:  66 65 64  Resp: 15 14  11   Temp: 98 F (36.7 C) (!) 97.5 F (36.4 C) 97.6 F (36.4 C) 97.7 F (36.5 C)  TempSrc: Oral Oral Oral Oral  SpO2: 96% 96% 91% 96%  Weight:  73.5 kg    Height:        Intake/Output Summary (Last 24 hours) at 08/30/2023 1353 Last data filed at 08/30/2023 1129 Gross per 24 hour  Intake 123 ml  Output 2350 ml  Net -2227 ml     Wt Readings from Last 3 Encounters:  08/30/23 73.5 kg  04/30/23 86.6 kg  12/01/22 86.6 kg    Physical Exam General: Alert and oriented x 3, NAD Cardiovascular: Irregular Respiratory: CTAB, no wheezing Gastrointestinal: Soft, nontender, nondistended, NBS Ext: 1+ pedal edema bilaterally on stumps Psych: Normal affect   Data Reviewed:  I have personally reviewed following labs    CBC Lab Results  Component Value Date   WBC 6.4 08/30/2023   RBC 3.22 (L) 08/30/2023   HGB 9.5 (L) 08/30/2023   HCT 29.5 (L) 08/30/2023   MCV 91.6 08/30/2023   MCH 29.5 08/30/2023   PLT 184 08/30/2023   MCHC 32.2 08/30/2023   RDW 17.2 (H) 08/30/2023   LYMPHSABS 2.0 08/24/2023   MONOABS 0.6 08/24/2023   EOSABS 0.1  08/24/2023   BASOSABS 0.1 08/24/2023     Last metabolic panel Lab Results  Component Value Date   NA 131 (L) 08/30/2023   K 3.5 08/30/2023   CL 91 (L) 08/30/2023   CO2 29 08/30/2023   BUN 43 (H) 08/30/2023   CREATININE 2.17 (H) 08/30/2023   GLUCOSE 116 (H) 08/30/2023   GFRNONAA 26 (L) 08/30/2023   GFRAA 77 (L) 09/06/2014  CALCIUM 8.4 (L) 08/30/2023   PHOS 3.3 01/29/2012   PROT 5.3 (L) 08/30/2023   ALBUMIN 2.4 (L) 08/30/2023   BILITOT 0.7 08/30/2023   ALKPHOS 92 08/30/2023   AST 21 08/30/2023   ALT 9 08/30/2023   ANIONGAP 11 08/30/2023    CBG (last 3)  Recent Labs    08/29/23 2111 08/30/23 0747 08/30/23 1127  GLUCAP 120* 120* 271*      Coagulation Profile: No results for input(s): "INR", "PROTIME" in the last 168 hours.   Radiology Studies: I have personally reviewed the imaging studies  No results found.      Thad Ranger M.D. [facility] 08/30/2023, 1:53 PM  Available via Epic secure chat 7am-7pm After 7 pm, please refer to night coverage provider listed on amion.

## 2023-08-30 NOTE — Progress Notes (Addendum)
Patient Name: [name] Date of Encounter: 08/30/2023 [facility] Cardiologist: Nona Dell, MD   Interval Summary  .    Patient reports breathing feels improved this morning. She still has some pressure/discomfort in her abdomen from swelling. She denies any  chest discomfort today but does reiterate that she has been having pain associated with palpitations and rapid HR both in the hospital and prior to admission.   Vital Signs .    Vitals:   08/29/23 2029 08/29/23 2330 08/30/23 0349 08/30/23 0745  BP: (!) 104/50 (!) 117/51 125/66   Pulse: 61  66 65  Resp: 16 15 14    Temp: 97.8 F (36.6 C) 98 F (36.7 C) (!) 97.5 F (36.4 C) 97.6 F (36.4 C)  TempSrc: Oral Oral Oral Oral  SpO2: 91% 96% 96% 91%  Weight:   73.5 kg   Height:        Intake/Output Summary (Last 24 hours) at 08/30/2023 0827 Last data filed at 08/30/2023 0350 Gross per 24 hour  Intake 123 ml  Output 1650 ml  Net -1527 ml      08/30/2023    3:49 AM 08/29/2023    3:25 AM 08/28/2023    4:17 AM  Last 3 Weights  Weight (lbs) 162 lb 1.6 oz 170 lb 13.7 oz 175 lb 4.3 oz  Weight (kg) 73.528 kg 77.5 kg 79.5 kg      Telemetry/ECG    Sinus rhythm with frequent PVCs, intermittent bigeminy. PVC burden nearly 40% - Personally Reviewed  Physical Exam .   GEN: No acute distress.   Neck: No JVD Cardiac: irregular with PVC. 3/6 murmur over apex Respiratory: Clear to auscultation bilaterally. GI: lower abdominal edema noted  MS: No edema  Assessment & Plan .     [name] is a 53 y.o. female with a hx of chronic HFmrEF, frequent PVCs, severe PAD s/p bilateral AKA, ongoing tobacco use, HLD, anxiety, depression, COPD, history of PE, CKD, type 2 diabetes who is being seen for the evaluation of CHF at the request of Dr. Ella Jubilee.   Acute on Chronic HFmrEF  Patient with July 2023 TTE that showed EV 40% with global hypokinesis. Repeat TTE in December 2023 with LVEF 50-55%. Patient presented to the [facility] ER complaining of progressive worsening shortness of breath, abdominal distention, and edema in her extremities.  Patient was a poor historian on arrival to the ED, could not confirm if she had been compliant with her medications/diuretics.  In the ED, chest x-ray showed bilateral pleural effusions, BNP elevated to greater than 7000.  Patient was admitted to [facility] on the internal medicine service on 08/21/2023. Repeat TTE at [facility] this admission found LVEF 30-35%, global hypokinesis.   Unclear etiology of cardiomyopathy, suspect largely secondary to PVC burden, though with PAD hx, certainly there may be a degree of CAD as well. Patient without clear anginal symptoms and not currently a candidate for LHC due to renal insufficiency. Due to borderline BP and worsening renal function, Entresto held. Unable to resume at this time with worsened renal function. Net negative 4.978 L this admission. Creatinine remains elevated, 2.17 today (up from 2.09). Patient still with edema noted in her abdomen, would continue IV lasix today. Plan for evening dose of Metolazone timed with Lasix to boost diuresis. Replace K today.  Continue Jardiance Indural converted to Toprol XL 25mg , continue with this dose today.   Frequent PVCs  Most recent cardiac monitor from  09/2022 showed frequent PVCs with 23.7% burden. Followed by Dr. Nelly Laurence with EP. Felt to not be a good candidate for ablation. Has been on propranolol 160 mg daily.  Indural converted to Toprol XL 25mg  this admission.  Patient also started on Mexiletine 150mg  on 2/1 given concern that high PVC burden contributing to her cardiomyopathy. Overnight telemetry still shows a very high PVC burden, ~40%.  While patient has significant arterial disease hx, reasonable to assume that at least a significant portion of her cardiomyopathy could be PVC induced. Could consider trial of amiodarone for PVC suppression, will discuss with Dr.  Anne Fu.  PAD  Continue ASA and home statin.   Moderate to severe mitral regurgitation  Updated TTE this admission showed posterior leaflet restriction, moderate to severe eccentric MR.   Mild aortic insufficiency  Per echo this admission. Continue monitoring in outpatient setting.   For questions or updates, please contact [facility] Please consult [email or web address] for contact info under        Signed, Perlie Gold, PA-C   Personally seen and examined. Agree with above.  53 year old female with multiple comorbidities including severe peripheral vascular disease with bilateral AKA's ongoing smoker frequent PVCs with underlying cardiomyopathy likely related to PVC burden which is currently approximating 30%, ejection fraction 40% as well.  Frequent ectopy on exam.  Agree that abdominal region is slightly edematous.  Patient is currently face timing  -We will go ahead and proceed with a dose of metolazone keeping careful watch on her electrolytes, potassium, sodium. -Dr. Jimmey Ralph with EP has started her on mexiletine.  Increased dose last evening.  PVC burden still remains quite elevated.  We will continue to monitor on this medication.  Ultimately we will try to avoid amiodarone given her age of 6 if possible.  Does not appear to be a good ablation candidate.  -Continue with Toprol 25 mg as well.  Moderate to severe mitral regurgitation likely result of underlying cardiomyopathy.  Nonsurgical candidate  High risk medication management with close monitoring with high level of medical complexity.  Donato Schultz, MD

## 2023-08-31 DIAGNOSIS — E1169 Type 2 diabetes mellitus with other specified complication: Secondary | ICD-10-CM | POA: Diagnosis not present

## 2023-08-31 DIAGNOSIS — N183 Chronic kidney disease, stage 3 unspecified: Secondary | ICD-10-CM | POA: Diagnosis not present

## 2023-08-31 DIAGNOSIS — I5023 Acute on chronic systolic (congestive) heart failure: Secondary | ICD-10-CM | POA: Diagnosis not present

## 2023-08-31 DIAGNOSIS — I10 Essential (primary) hypertension: Secondary | ICD-10-CM | POA: Diagnosis not present

## 2023-08-31 LAB — BASIC METABOLIC PANEL
Anion gap: 13 (ref 5–15)
BUN: 46 mg/dL — ABNORMAL HIGH (ref 6–20)
CO2: 31 mmol/L (ref 22–32)
Calcium: 8.6 mg/dL — ABNORMAL LOW (ref 8.9–10.3)
Chloride: 89 mmol/L — ABNORMAL LOW (ref 98–111)
Creatinine, Ser: 2.18 mg/dL — ABNORMAL HIGH (ref 0.44–1.00)
GFR, Estimated: 26 mL/min — ABNORMAL LOW (ref >60)
Glucose, Bld: 144 mg/dL — ABNORMAL HIGH (ref 70–99)
Potassium: 3.6 mmol/L (ref 3.5–5.1)
Sodium: 133 mmol/L — ABNORMAL LOW (ref 135–145)

## 2023-08-31 LAB — CBC
HCT: 29.8 % — ABNORMAL LOW (ref 36.0–46.0)
Hemoglobin: 9.6 g/dL — ABNORMAL LOW (ref 12.0–15.0)
MCH: 29.3 pg (ref 26.0–34.0)
MCHC: 32.2 g/dL (ref 30.0–36.0)
MCV: 90.9 fL (ref 80.0–100.0)
Platelets: 213 10*3/uL (ref 150–400)
RBC: 3.28 MIL/uL — ABNORMAL LOW (ref 3.87–5.11)
RDW: 17.2 % — ABNORMAL HIGH (ref 11.5–15.5)
WBC: 6.5 10*3/uL (ref 4.0–10.5)
nRBC: 0 % (ref 0.0–0.2)

## 2023-08-31 LAB — GLUCOSE, CAPILLARY
Glucose-Capillary: 131 mg/dL — ABNORMAL HIGH (ref 70–99)
Glucose-Capillary: 200 mg/dL — ABNORMAL HIGH (ref 70–99)
Glucose-Capillary: 168 mg/dL — ABNORMAL HIGH (ref 70–99)

## 2023-08-31 MED ORDER — POTASSIUM CHLORIDE CRYS ER 20 MEQ PO TBCR
40.0000 meq | EXTENDED_RELEASE_TABLET | Freq: Two times a day (BID) | ORAL | Status: AC
  Administered 2023-08-31 (×2): 40 meq via ORAL
  Filled 2023-08-31 (×2): qty 2

## 2023-08-31 MED ORDER — INSULIN ASPART 100 UNIT/ML IJ SOLN
2.0000 [IU] | Freq: Three times a day (TID) | INTRAMUSCULAR | Status: DC
Start: 2023-08-31 — End: 2023-09-01
  Administered 2023-08-31: 2 [IU] via SUBCUTANEOUS

## 2023-08-31 MED ORDER — METOLAZONE 5 MG PO TABS
2.5000 mg | ORAL_TABLET | Freq: Once | ORAL | Status: AC
Start: 2023-08-31 — End: 2023-08-31
  Administered 2023-08-31: 2.5 mg via ORAL
  Filled 2023-08-31: qty 1

## 2023-08-31 NOTE — Plan of Care (Signed)
 Will continue to monitor patient.

## 2023-08-31 NOTE — Progress Notes (Signed)
 [facility]                                                                              [name], is a 53 y.o. female, DOB - 09-30-1970, FMW:[id number] Admit date - 08/21/2023    Outpatient Primary MD for the patient is [name], Sharon E, PA-C  LOS - 10  days  No chief complaint on file.      Brief summary   Patient is a 53 year old female with CHF, CAD, diabetes mellitus type 2, HTN, bilateral BKA and CKD stage 3 presented with progressive shortness of breath, abdominal distention, lower extremity edema.   She was evaluated at [facility] ED, she was diagnosed with volume overload and was transferred to [facility] for further treatment and diagnosis.  At the time of her transfer she was somnolent, blood pressure was 94/61, HR 80, RR 12 and 02 saturation 93% on 3 L/min per [place].  BNP > 4500, creatinine 1.7 Chest x-ray showed cardiomegaly with bilateral hilar vascular congestion, cephalization of the vasculature, bilateral pleural effusions.  Significant events 01/26: Lethargic, hypoglycemia, started on BiPAP, acute respiratory and metabolic acidosis  01/27: Persistent hypoglycemia, respiratory acidosis improved, no lactic acidosis.  Placed on D5, then D10 infusion  01/28: mentation back to baseline, D10 discontinued.  On diuresis.  01/30: Clinically improving   Assessment & Plan    Principal Problem:   Acute on chronic systolic and diastolic combined CHF (congestive heart failure) (HCC) Moderate to severe mitral regurgitation, mild aortic insufficiency -2D echo showed EF 30 to 35%, RV systolic function with moderate reduction, LA with severe dilation, RA with moderate dilation, moderate to severe MR, aortic valve stenosis, trivial pericardial effusion -Continue Jardiance , Toprol -XL -Continue to hold Entresto , amlodipine   -Cardiology following.  Renal function limits aggressive diuresis.  - Still has anasarca, continue IV Lasix  80 mg every 12 hours, metolazone ,  negative balance of 5.9 L. -Weight down from 164.46 lbs on admission -> 184.3 on 08/27/2023-> 152lbs today    Acute hypoxemic and hypercapnic respiratory failure due to cardiogenic pulmonary edema. -Improved ventilation with BiPAP on admission -Currently on 2 L  Frequent PVCs -Followed by EP cardiology, placed on Toprol -XL this admission - started on mexiletine 200 mg every 12 hours on 2/1    Acute on CKD (chronic kidney disease) stage 3b, GFR 30-59 ml/min (HCC) -Baseline creatinine 1.5 - Renal US  with no hydronephrosis, with small amount of free fluid in the right upper quadrant.  -Creatinine stable at 2.1   Hypertension -BP stable, continue diuresis, metoprolol     Type 2 diabetes mellitus with hyperlipidemia (HCC) Hypoglycemia may have acute metabolic encephalopathy -Initially hypoglycemia requiring D5 and D10 infusion.  CBG (last 3)  Recent Labs    08/30/23 2131 08/31/23 0716 08/31/23 1134  GLUCAP 211* 131* 200*   -NovoLog  2 units daily AC, continue sliding scale insulin    Acute metabolic encephalopathy, diabetic neuropathy -Decreased Neurontin  to 100 mg 3 times daily, change Robaxin  to as needed, decrease Cymbalta  to 30 mg daily -Continue tramadol  as needed -Much more alert and oriented now. PAD -Continue aspirin , statin    COPD (chronic obstructive pulmonary disease) (HCC)  No signs of acute exacerbation, continue bronchodilator therapy.    Estimated body mass index is 24.66 kg/m as calculated from the following:   Height as of this encounter: 5' 6 (1.676 m).   Weight as of this encounter: 69.3 kg.  Code Status: full  DVT Prophylaxis:  heparin  injection 5,000 Units Start: 08/21/23 2315   Level of Care: Level of care: Progressive Family Communication:  Disposition Plan:      Remains inpatient appropriate:   Patient wants to go home when medically ready and does not want to go back to SNF   Procedures:   Consultants:   Cardiology  Antimicrobials:    Anti-infectives (From admission, onward)    None          Medications  aspirin  EC  81 mg Oral Daily   clopidogrel   75 mg Oral Daily   cyanocobalamin   1,000 mcg Oral Daily   DULoxetine   30 mg Oral Daily   empagliflozin   10 mg Oral Daily   furosemide   80 mg Intravenous Q12H   gabapentin   100 mg Oral TID   Gerhardt's butt cream   Topical TID   heparin   5,000 Units Subcutaneous Q8H   insulin  aspart  0-9 Units Subcutaneous TID WC   insulin  aspart  2 Units Subcutaneous TID WC   metolazone   2.5 mg Oral Once   metoprolol  succinate  25 mg Oral Daily   mexiletine  200 mg Oral Q12H   nicotine   21 mg Transdermal Daily   pantoprazole   40 mg Oral Daily   potassium chloride   40 mEq Oral BID   rosuvastatin   40 mg Oral Daily   sodium chloride  flush  3 mL Intravenous Q12H      Subjective:   [name] was seen and examined today.  Overall appears to be improving, however states still has significant swelling on the stumps.  No chest pain, no acute shortness of breath, fevers.  Objective:   Vitals:   08/31/23 0025 08/31/23 0033 08/31/23 0429 08/31/23 1135  BP: (!) 112/42  116/69   Pulse: (!) 55  67 67  Resp: 12 12 12 11   Temp: 98 F (36.7 C)  97.9 F (36.6 C) 97.7 F (36.5 C)  TempSrc: Oral  Oral Oral  SpO2:  (!) 89%    Weight:   69.3 kg   Height:        Intake/Output Summary (Last 24 hours) at 08/31/2023 1503 Last data filed at 08/31/2023 1451 Gross per 24 hour  Intake 1083 ml  Output 1400 ml  Net -317 ml     Wt Readings from Last 3 Encounters:  08/31/23 69.3 kg  04/30/23 86.6 kg  12/01/22 86.6 kg   Physical Exam General: Alert and oriented x 3, NAD Cardiovascular: Irregular Respiratory: CTAB Gastrointestinal: Soft, nontender, nondistended, NBS, anasarca Ext: bilateral AKA Neuro: no new deficits Psych: Normal affect   Data Reviewed:  I have personally reviewed following labs    CBC Lab Results  Component Value Date   WBC 6.5 08/31/2023   RBC 3.28  (L) 08/31/2023   HGB 9.6 (L) 08/31/2023   HCT 29.8 (L) 08/31/2023   MCV 90.9 08/31/2023   MCH 29.3 08/31/2023   PLT 213 08/31/2023   MCHC 32.2 08/31/2023   RDW 17.2 (H) 08/31/2023   LYMPHSABS 2.0 08/24/2023   MONOABS 0.6 08/24/2023   EOSABS 0.1 08/24/2023   BASOSABS 0.1 08/24/2023     Last metabolic panel Lab Results  Component Value Date  NA 133 (L) 08/31/2023   K 3.6 08/31/2023   CL 89 (L) 08/31/2023   CO2 31 08/31/2023   BUN 46 (H) 08/31/2023   CREATININE 2.18 (H) 08/31/2023   GLUCOSE 144 (H) 08/31/2023   GFRNONAA 26 (L) 08/31/2023   GFRAA 77 (L) 09/06/2014   CALCIUM  8.6 (L) 08/31/2023   PHOS 3.3 01/29/2012   PROT 5.3 (L) 08/30/2023   ALBUMIN 2.4 (L) 08/30/2023   BILITOT 0.7 08/30/2023   ALKPHOS 92 08/30/2023   AST 21 08/30/2023   ALT 9 08/30/2023   ANIONGAP 13 08/31/2023    CBG (last 3)  Recent Labs    08/30/23 2131 08/31/23 0716 08/31/23 1134  GLUCAP 211* 131* 200*      Coagulation Profile: No results for input(s): INR, PROTIME in the last 168 hours.   Radiology Studies: I have personally reviewed the imaging studies  No results found.      Nydia Distance M.D. [facility] 08/31/2023, 3:03 PM  Available via Epic secure chat 7am-7pm After 7 pm, please refer to night coverage provider listed on amion.

## 2023-08-31 NOTE — Progress Notes (Addendum)
  Reviewed case with Dr. Kennyth. Pt continues to have frequent PVCs despite starting on mexitil .   Would continue at current dose for now, with hesitancy to start amiodarone until she has had longer on the drug. Would keep outpatient follow up to further assess and discuss.   Reviewed with pt who is agreeable  [name], [place]  08/31/2023 10:49 AM

## 2023-08-31 NOTE — Progress Notes (Signed)
   08/31/23 2332  BiPAP/CPAP/SIPAP  $ Non-Invasive Ventilator  Non-Invasive Vent Subsequent  BiPAP/CPAP/SIPAP Pt Type Adult  BiPAP/CPAP/SIPAP SERVO  Mask Type Full face mask  Set Rate 24 breaths/min  Respiratory Rate 25 breaths/min  IPAP 21 cmH20  PEEP 5 cmH20  FiO2 (%) 40 %  Minute Ventilation 17  Leak 40  Peak Inspiratory Pressure (PIP) (S)  21  Tidal Volume (Vt) 546  Patient Home Equipment No  Press High Alarm 30 cmH2O  BiPAP/CPAP /SiPAP Vitals  Resp 13  SpO2 96 %  MEWS Score/Color  MEWS Score 1  MEWS Score Color [place]

## 2023-08-31 NOTE — Inpatient Diabetes Management (Signed)
Inpatient Diabetes Program Recommendations  AACE/ADA: New Consensus Statement on Inpatient Glycemic Control (2015)  Target Ranges:  Prepandial:   less than 140 mg/dL      Peak postprandial:   less than 180 mg/dL (1-2 hours)      Critically ill patients:  140 - 180 mg/dL   Lab Results  Component Value Date   GLUCAP 200 (H) 08/31/2023   HGBA1C 7.6 (H) 08/21/2023    Review of Glycemic Control  Latest Reference Range & Units 08/30/23 07:47 08/30/23 11:27 08/30/23 17:22 08/30/23 21:31 08/31/23 07:16 08/31/23 11:34  Glucose-Capillary 70 - 99 mg/dL 409 (H) 811 (H) 914 (H) 211 (H) 131 (H) 200 (H)   Diabetes history: DM 2 Outpatient Diabetes medications: Lantus Jardiance and Ozempic in the past Current orders for Inpatient glycemic control:  Jardiance 10 mg Daily Novolog 0-9 units tid  Inpatient Diabetes Program Recommendations:   If in the plan of care consider: -   Adding Novolog 2 units tid meal coverage if eating >50% of meals -   Add Novolog hs scale  Thanks,  Christena Deem RN, MSN, BC-ADM Inpatient Diabetes Coordinator Team Pager [phone] (8a-5p)

## 2023-08-31 NOTE — Progress Notes (Addendum)
 Patient Name: [name] Date of Encounter: 08/31/2023 [facility] Cardiologist: Jayson Sierras, MD   Interval Summary  .    Patient reports feeling about the same today as compared with yesterday. Her breathing noted to be slightly improved. Abdominal edema still present but improving. She seems discouraged by lack of progress/length of admission.   Vital Signs .    Vitals:   08/30/23 2000 08/31/23 0025 08/31/23 0033 08/31/23 0429  BP: 121/76 (!) 112/42  116/69  Pulse:  (!) 55  67  Resp: 12 12 12 12   Temp: 97.9 F (36.6 C) 98 F (36.7 C)  97.9 F (36.6 C)  TempSrc: Oral Oral  Oral  SpO2: 96%  (!) 89%   Weight:    69.3 kg  Height:        Intake/Output Summary (Last 24 hours) at 08/31/2023 0735 Last data filed at 08/31/2023 0433 Gross per 24 hour  Intake 243 ml  Output 2100 ml  Net -1857 ml      08/31/2023    4:29 AM 08/30/2023    3:49 AM 08/29/2023    3:25 AM  Last 3 Weights  Weight (lbs) 152 lb 12.5 oz 162 lb 1.6 oz 170 lb 13.7 oz  Weight (kg) 69.3 kg 73.528 kg 77.5 kg      Telemetry/ECG    Sinus rhythm with frequent PVCs, bigeminy. PVC burden last 24 hours around 40% - Personally Reviewed  Physical Exam .   GEN: No acute distress.   Neck: No JVD Cardiac: irregularly with frequent PVCs, no murmurs, rubs, or gallops.  Respiratory: Clear to auscultation bilaterally. GI: continues with lower abdominal edema, marginally improved from yesterday MS: No edema, s/p bilateral AKA.  Assessment & Plan .   [name] is a 53 y.o. female with a hx of chronic HFmrEF, frequent PVCs, severe PAD s/p bilateral AKA, ongoing tobacco use, HLD, anxiety, depression, COPD, history of PE, CKD, type 2 diabetes who is being seen for the evaluation of CHF at the request of Dr. Noralee.   Acute on Chronic HFmrEF  Patient with July 2023 TTE that showed EV 40% with global hypokinesis. Repeat TTE in December 2023 with LVEF 50-55%. Patient presented to the [facility] ER  complaining of progressive worsening shortness of breath, abdominal distention, and edema in her extremities.  Patient was a poor historian on arrival to the ED, could not confirm if she had been compliant with her medications/diuretics.  In the ED, chest x-ray showed bilateral pleural effusions, BNP elevated to greater than 7000.  Patient was admitted to [facility] on the internal medicine service on 08/21/2023. Repeat TTE at [facility] this admission found LVEF 30-35%, global hypokinesis.  Suspect PVC induced cardiomyopathy, though with PAD hx, certainly there may be a degree of CAD as well. Patient without clear anginal symptoms and not currently a candidate for LHC due to renal insufficiency.  Patient received IV lasix  40mg  on 2/3 with Metolazone  timed with evening dose, brisk urine output response noted. Volume mildly improved but does still have abdominal edema. Will repeat Metolazone  with evening lasix  given stable creatinine. Replace K, target level 4.  Continue to hold Entresto  Continue Jardiance  Continue Toprol  XL 25mg    Frequent PVCs  Most recent cardiac monitor from 09/2022 showed frequent PVCs with 23.7% burden. Followed by Dr. Nancey with EP. Felt to not be a good candidate for ablation.   Continue Toprol  XL 25mg . Limited room for titration based on current BP Patient  started on Mexiletine 150mg  on 2/1, increased to 200mg  2/2. Patient still with PVC burden around 42%. Based on half-life of Mexiletine, would expect full therapeutic effect after 50-60 hours on regimen. Given persistent PVC burden, could consider increasing Mexiletine vs transition to alternative anti-arrhythmic such as Amiodarone. Given age of 66, long-term Amiodarone not ideal due to potential for toxicity.  Will consult EP for recs on PVC management.  PAD  Continue ASA and home statin.   Moderate to severe mitral valve regurgitation  Noted on echocardiogram this admission. Suspect secondary to cardiomyopathy. She  would be a poor surgical candidate, will need serial imaging to monitor.   Mild aortic insufficiency  Per echo this admission. Continue monitoring in outpatient setting.   For questions or updates, please contact [facility] Please consult [email or web address] for contact info under        Signed, Artist Pouch, PA-C   Personally seen and examined. Agree with above.  42% PVC burden.  We will reconsult EP for suggestions.  Currently on mexiletine which was uptitrated.  200 mg started on 08/29/2023.  Does not appear to be a surgical candidate for mitral valve repair. Does not appear to be an optimal candidate for PVC ablation.  Continue with medication management including Jardiance , Toprol  25 mg  Severe peripheral arterial disease with bilateral AKA's.  Oneil Parchment, MD

## 2023-08-31 NOTE — Progress Notes (Signed)
   08/31/23 0033  BiPAP/CPAP/SIPAP  Reason BIPAP/CPAP not in use Non-compliant (Refused)

## 2023-08-31 NOTE — Progress Notes (Signed)
 Heart Failure Stewardship Pharmacist Progress Note   PCP: Jeanette Comer BRAVO, PA-C PCP-Cardiologist: Jayson Sierras, MD    HPI:  53 yo F with PMH of HFmrEF, frequent PVCs, severe PAD s/p bilateral AKA, tobacco use disorder, hyperlipidemia, anxiety, depression, COPD, hx of PE, type 2 diabetes. History of ECHO with EF 40% in 07/2021, improved to 55% in 07/2022. Last ECHO on 04/2023 with EF 45-50%, elevated LVEDP, normal RV function, small pericardial effusion, mild MR, mild aortic valve regurgitation. Has followed with EP as outpatient for PVCs, PACs, brief episodes of NSVT on long-term monitor. Felt not to be a candidate for ablation and her metoprolol  was transitioned to propranolol .   Patient presented to [facility] ED on 1/24 with worsening SOB, abdominal distention, and LEE. Chest x-ray showed bilateral pleural effusions, BNP>7000. She was transferred to [facility] on 1/25. Once at [facility], BNP >4500. Patient somnolent with worsening wheezing on exam, noted she was given multiple doses of morphine  in [place]. Narcan  administered at [facility]. Also noted to be hypoglycemic, started on dextrose . Found to have acute respiratory and metabolic acidosis and was started on BiPAP. Initiated diuresis with IV lasix , home GDMT held due to hypotension and acute respiratory and metabolic acidosis. New ECHO 1/27 with LVEF 30-35%, no RWMA, RV moderately reduced, moderate to severe concentric MR. Jardiance  restarted on 1/28.  Patient reports that Jardiance  and Entresto  were stopped during last hospitalization at [facility], although both of these were filled on 08/04/23. Per care everywhere, it does look like Entresto  was temporarily stopped due to acute renal failure during recent hospitalization 06/2023 at [facility]. Patient also with hypoglycemia during last hospitalization.   Patient reports feeling okay today. Off oxygen. Not on supplemental oxygen at home. Still reporting some difficulty with shortness of breath. Unable to lay  flat. LEE still present in thighs and but abdominal edema is improving. Has had a good response to metolazone . Complains of achy pains in her legs.    Current HF Medications: Diuretic: furosemide  80 mg IV BID Beta Blocker: metoprolol  XL 25 mg daily SGLT2i: Jardiance  10 mg daily  Prior to admission HF Medications: Beta blocker: propranolol  ER 160 mg daily ACE/ARB/ARNI: Entresto  24/26 mg BID SGLT2i: Jardiance  25 mg daily  Pertinent Lab Values: Serum creatinine 2.18, BUN 46, Potassium 3.6, Sodium 133, BNP >4500, Magnesium  2.1, A1c 7.6  Vital Signs: Weight: 152 lbs (admission weight: 164 lbs) - bed weights Blood pressure: 110/50-60s  Heart rate: 60-70s I/O: net -1.9 L yesterday; net -6.9 L since admission  Medication Assistance / Insurance Benefits Check: Does the patient have prescription insurance?  Yes Type of insurance plan: Medicare + Medicaid  Outpatient Pharmacy:  Prior to admission outpatient pharmacy: [facility] Is the patient willing to use [facility] TOC pharmacy at discharge? Yes Is the patient willing to transition their outpatient pharmacy to utilize a [facility] outpatient pharmacy?   No    Assessment:  1. Acute on chronic HFmrEF (LVEF 30-35%), due to unclear etiology. NYHA class II symptoms. - Continue furosemide  80 mg IV BID. Output increased on 1/31 and 2/3 with the addition of metolazone . Creatinine stable. Consider addition of metolazone  2.5 mg again today.  - Continue metoprolol  XL 25 mg daily  - Continue Jardiance  10 mg daily - Continue to hold off on Entresto  with AKI and lower BP - Daily weights, strict I/Os, keep K>4 and Mg>2. K 3.5>3.6 after one dose of 40 mEq yesterday. If repeating metolazone , would give two doses.    Plan: 1) Medication  changes recommended at this time: - Metolazone  2.5 mg x1 today - KCl 40 mEq x2  2) Patient assistance: - Patient has Medicare + Medicaid - Entresto /Jardiance  $0  3)  Education  - Patient has been educated on current HF  medications and potential additions to HF medication regimen - Patient verbalizes understanding that over the next few months, these medication doses may change and more medications may be added to optimize HF regimen - Patient has been educated on basic disease state pathophysiology and goals of therapy   Duwaine Plant, PharmD, BCPS Heart Failure Stewardship Pharmacist Phone [phone]

## 2023-08-31 NOTE — TOC Progression Note (Signed)
 Transition of Care [facility]) - Progression Note    Patient Details  Name: [name] MRN: [id number] Date of Birth: [birth year]  Transition of Care [facility] CM/SW Contact  Isaiah Public, LCSWA Phone Number: 08/31/2023, 10:58 AM  Clinical Narrative:     CSW followed up with patient on SNF placement/DC plan. Patient has decided that wants to return home when medically ready for dc. CSW informed patient CM will follow up for home needs. All questions answered.No further questions reported at this time.   Expected Discharge Plan: Home/Self Care Barriers to Discharge: Continued Medical Work up  Expected Discharge Plan and Services In-house Referral: NA Discharge Planning Services: CM Consult   Living arrangements for the past 2 months: Single Family Home                                       Social Determinants of Health (SDOH) Interventions SDOH Screenings   Food Insecurity: No Food Insecurity (08/23/2023)  Housing: Low Risk  (08/23/2023)  Transportation Needs: No Transportation Needs (08/23/2023)  Utilities: Not At Risk (08/23/2023)  Alcohol  Screen: Low Risk  (03/18/2022)  Depression (PHQ2-9): Medium Risk (03/18/2022)  Financial Resource Strain: Low Risk  (07/23/2023)   [facility]  Physical Activity: Inactive (07/23/2023)   Received from [facility]  Social Connections: Socially Isolated (07/23/2023)   Received from [facility]  Stress: No Stress Concern Present (07/23/2023)   Received from [facility]  Tobacco Use: High Risk (08/24/2023)  Health Literacy: Inadequate Health Literacy (08/04/2023)   Received from [name]    Readmission Risk Interventions     No data to display

## 2023-09-01 ENCOUNTER — Inpatient Hospital Stay (HOSPITAL_COMMUNITY): Payer: 59

## 2023-09-01 DIAGNOSIS — I10 Essential (primary) hypertension: Secondary | ICD-10-CM | POA: Diagnosis not present

## 2023-09-01 DIAGNOSIS — N183 Chronic kidney disease, stage 3 unspecified: Secondary | ICD-10-CM | POA: Diagnosis not present

## 2023-09-01 DIAGNOSIS — I5023 Acute on chronic systolic (congestive) heart failure: Secondary | ICD-10-CM | POA: Diagnosis not present

## 2023-09-01 DIAGNOSIS — E1169 Type 2 diabetes mellitus with other specified complication: Secondary | ICD-10-CM | POA: Diagnosis not present

## 2023-09-01 LAB — GLUCOSE, CAPILLARY
Glucose-Capillary: 163 mg/dL — ABNORMAL HIGH (ref 70–99)
Glucose-Capillary: 190 mg/dL — ABNORMAL HIGH (ref 70–99)
Glucose-Capillary: 236 mg/dL — ABNORMAL HIGH (ref 70–99)
Glucose-Capillary: 163 mg/dL — ABNORMAL HIGH (ref 70–99)

## 2023-09-01 LAB — BASIC METABOLIC PANEL
Anion gap: 14 (ref 5–15)
BUN: 45 mg/dL — ABNORMAL HIGH (ref 6–20)
CO2: 32 mmol/L (ref 22–32)
Calcium: 9.3 mg/dL (ref 8.9–10.3)
Chloride: 88 mmol/L — ABNORMAL LOW (ref 98–111)
Creatinine, Ser: 2.34 mg/dL — ABNORMAL HIGH (ref 0.44–1.00)
GFR, Estimated: 24 mL/min — ABNORMAL LOW (ref >60)
Glucose, Bld: 102 mg/dL — ABNORMAL HIGH (ref 70–99)
Potassium: 4.2 mmol/L (ref 3.5–5.1)
Sodium: 134 mmol/L — ABNORMAL LOW (ref 135–145)

## 2023-09-01 MED ORDER — INSULIN ASPART 100 UNIT/ML IJ SOLN
3.0000 [IU] | Freq: Three times a day (TID) | INTRAMUSCULAR | Status: DC
  Administered 2023-09-01 – 2023-09-03 (×5): 3 [IU] via SUBCUTANEOUS

## 2023-09-01 MED ORDER — INSULIN ASPART 100 UNIT/ML IJ SOLN
0.0000 [IU] | Freq: Three times a day (TID) | INTRAMUSCULAR | Status: DC
Start: 2023-09-01 — End: 2023-09-03
  Administered 2023-09-01: 5 [IU] via SUBCUTANEOUS
  Administered 2023-09-02: 3 [IU] via SUBCUTANEOUS
  Administered 2023-09-02: 2 [IU] via SUBCUTANEOUS
  Administered 2023-09-02: 5 [IU] via SUBCUTANEOUS
  Administered 2023-09-03: 2 [IU] via SUBCUTANEOUS

## 2023-09-01 MED ORDER — DICYCLOMINE HCL 10 MG PO CAPS
10.0000 mg | ORAL_CAPSULE | Freq: Three times a day (TID) | ORAL | Status: DC
  Administered 2023-09-01 – 2023-09-03 (×6): 10 mg via ORAL
  Filled 2023-09-01 (×6): qty 1

## 2023-09-01 NOTE — Progress Notes (Signed)
 Physical Therapy Treatment Patient Details Name: [name] MRN: [id number] DOB: January 04, 1971 Today's Date: 09/01/2023   History of Present Illness The pt is a 53 yo female presenting 1/25 from outside hospital with SOB and edema. Admitted for management of CHF exacerbation. Hospital course complicated by hypoglycemia, acute respiratory and metabolic acidosis, subsequently started on BiPAP 1/27. PMH includes: anemia, anxiety, depression, CKD III, COPD, DM II, HTN, PAD s/p bilateral AKA, PE on Xarelto, PVC.    PT Comments  Session focused on functional mobility required for transfers. Pt required increased time for bed mobility and transfers secondary to muscular weakness, fatigue, and decreased processing. Additionally, pt required VC and TC for proper hand placement, initiation, body positioning, and sequencing. To transfer from bed to chair required mod A x2 with bed pad as a sling. Pt showed improvement with ability to clear her bottom during chair push ups using the arm rests of the recliner and performed 8 with mod A. Pt would benefit skilled PT that promotes functional mobility, hip strengthening, increasing activity tolerance, and wheelchair mobility. If pt declines SNF, would recommend 24/7 home care and DME listed below. Will continue to follow acutely.    If plan is discharge home, recommend the following: Two people to help with walking and/or transfers;Two people to help with bathing/dressing/bathroom;Direct supervision/assist for medications management;Direct supervision/assist for financial management;Assist for transportation;Help with stairs or ramp for entrance   Can travel by private vehicle     No  Equipment Recommendations  Other (comment);Hoyer lift;Hospital bed;BSC/3in1 (drop-arm [facility])    Recommendations for Other Services       Precautions / Restrictions Precautions Precautions: Fall Precaution Comments: bilateral AKA Restrictions Weight Bearing Restrictions Per  Provider Order: No     Mobility  Bed Mobility Overal bed mobility: Needs Assistance Bed Mobility:  (Lateral scooting from long sitting)           General bed mobility comments: Pt found long sitting in bed and had difficulty with lateral scooting to EOB. Required mod A to bring hips to EOB. HOB elevated and pt used rails.    Transfers Overall transfer level: Needs assistance Equipment used: None Transfers: Bed to chair/wheelchair/BSC            Lateral/Scoot Transfers: Mod assist, +2 physical assistance General transfer comment: Mod A x2 using bed pad as sling to move from bed to recliner. Pt required VC and TC for proper hand placement, proper sequencing of using UEs to provide hip clearance, and weight shift.    Ambulation/Gait               General Gait Details: Unable at this time   Stairs             Wheelchair Mobility     Tilt Bed    Modified Rankin (Stroke Patients Only)       Balance Overall balance assessment: Needs assistance Sitting-balance support: Bilateral upper extremity supported, No upper extremity supported Sitting balance-Leahy Scale: Fair Sitting balance - Comments: Anterior lean bringing and intermittently resting elbows on thighs Postural control: Other (comment) (anterior lean)     Standing balance comment: unable at this time                            Cognition Arousal: Alert Behavior During Therapy: [facility] for tasks assessed/performed Overall Cognitive Status: No family/caregiver present to determine baseline cognitive functioning Area of Impairment: Following commands, Problem solving  Following Commands: Follows multi-step commands with increased time, Follows one step commands consistently     Problem Solving: Slow processing, Decreased initiation, Requires verbal cues          Exercises      General Comments General comments (skin integrity, edema, etc.): VSS on   throughout session.      Pertinent Vitals/Pain Pain Assessment Pain Assessment: Faces Faces Pain Scale: No hurt Pain Intervention(s): Monitored during session    Home Living Family/patient expects to be discharged to:: Private residence                        Prior Function            PT Goals (current goals can now be found in the care plan section) Acute Rehab PT Goals Patient Stated Goal: to return home, not need SNF PT Goal Formulation: With patient Time For Goal Achievement: 09/13/23 Potential to Achieve Goals: Fair Progress towards PT goals: Progressing toward goals    Frequency    Min 1X/week      PT Plan      Co-evaluation              AM-PAC PT 6 Clicks Mobility   Outcome Measure  Help needed turning from your back to your side while in a flat bed without using bedrails?: None Help needed moving from lying on your back to sitting on the side of a flat bed without using bedrails?: A Little Help needed moving to and from a bed to a chair (including a wheelchair)?: Total Help needed standing up from a chair using your arms (e.g., wheelchair or bedside chair)?: Total Help needed to walk in hospital room?: Total Help needed climbing 3-5 steps with a railing? : Total 6 Click Score: 11    End of Session Equipment Utilized During Treatment: Gait belt;Oxygen Activity Tolerance: Patient tolerated treatment well Patient left: in chair;with call bell/phone within reach;with chair alarm set Nurse Communication: Mobility status PT Visit Diagnosis: Other abnormalities of gait and mobility (R26.89);Muscle weakness (generalized) (M62.81)     Time: 8368-8351 PT Time Calculation (min) (ACUTE ONLY): 17 min  Charges:    $Therapeutic Activity: 8-22 mins PT General Charges $$ ACUTE PT VISIT: 1 Visit                     [address], SPT   [place] 09/01/2023, 5:43 PM

## 2023-09-01 NOTE — Progress Notes (Addendum)
 [facility]                                                                              [name], is a 53 y.o. female, DOB - 11-04-1970, FMW:[id number] Admit date - 08/21/2023    Outpatient Primary MD for the patient is Skillman, Katherine E, PA-C  LOS - 11  days  No chief complaint on file.      Brief summary   Patient is a 53 year old female with CHF, CAD, diabetes mellitus type 2, HTN, bilateral BKA and CKD stage 3 presented with progressive shortness of breath, abdominal distention, lower extremity edema.   She was evaluated at [facility] ED, she was diagnosed with volume overload and was transferred to [facility] for further treatment and diagnosis.  At the time of her transfer she was somnolent, blood pressure was 94/61, HR 80, RR 12 and 02 saturation 93% on 3 L/min per [place].  BNP > 4500, creatinine 1.7 Chest x-ray showed cardiomegaly with bilateral hilar vascular congestion, cephalization of the vasculature, bilateral pleural effusions.  Significant events 01/26: Lethargic, hypoglycemia, started on BiPAP, acute respiratory and metabolic acidosis  01/27: Persistent hypoglycemia, respiratory acidosis improved, no lactic acidosis.  Placed on D5, then D10 infusion  01/28: mentation back to baseline, D10 discontinued.  On diuresis.  01/30: Clinically improving   Assessment & Plan    Principal Problem:   Acute on chronic systolic and diastolic combined CHF (congestive heart failure) (HCC) Moderate to severe mitral regurgitation, mild aortic insufficiency -2D echo showed EF 30 to 35%, RV systolic function with moderate reduction, LA with severe dilation, RA with moderate dilation, moderate to severe MR, aortic valve stenosis, trivial pericardial effusion -Continue Jardiance , Toprol -XL -Continue to hold Entresto , amlodipine   -Cardiology following.  Renal function limits aggressive diuresis.  - Still has anasarca, negative balance of 7.2 L -Weight down from 164.46  lbs on admission -> 184.3 on 08/27/2023-> 143.9 lbs today -Per cardiology, continue IV Lasix , received metolazone     Acute hypoxemic and hypercapnic respiratory failure due to cardiogenic pulmonary edema. -Improved ventilation with BiPAP on admission -Currently on 2 L  Abdominal pain -No nausea vomiting, eating breakfast without difficulty, reports abdominal pain for a while -Will place on Bentyl  10mg  TID, obtain CT abdomen   Frequent PVCs -Followed by EP cardiology, placed on Toprol -XL this admission - started on mexiletine 200 mg every 12 hours on 2/1    Acute on CKD (chronic kidney disease) stage 3b, GFR 30-59 ml/min (HCC) -Baseline creatinine 1.5 - Renal US  with no hydronephrosis, with small amount of free fluid in the right upper quadrant.  -Creatinine stable at 2.1   Hypertension -BP stable, continue metoprolol , on IV Lasix     Type 2 diabetes mellitus with hyperlipidemia (HCC) Hypoglycemia may have acute metabolic encephalopathy -Initially hypoglycemia requiring D5 and D10 infusion.  CBG (last 3)  Recent Labs    08/31/23 2037 09/01/23 0804 09/01/23 1155  GLUCAP 168* 163* 190*   -Changed to sliding scale insulin , moderate, NovoLog  3 units 3 times daily AC   Acute metabolic encephalopathy, diabetic neuropathy -Mental status improved, alert and oriented x 3, at baseline  -  Continue Neurontin  100 mg 3 times daily, Cymbalta  30 mg daily, Robaxin  as needed -Continue tramadol  as needed  PAD -Continue aspirin , statin    COPD (chronic obstructive pulmonary disease) (HCC) No signs of acute exacerbation, continue bronchodilator therapy.    Estimated body mass index is 23.24 kg/m as calculated from the following:   Height as of this encounter: 5' 6 (1.676 m).   Weight as of this encounter: 65.3 kg.  Code Status: full  DVT Prophylaxis:  heparin  injection 5,000 Units Start: 08/21/23 2315   Level of Care: Level of care: Progressive Family Communication:   Disposition Plan:      Remains inpatient appropriate:   Patient wants to go home when medically ready and does not want to go back to SNF   Procedures:   Consultants:   Cardiology  Antimicrobials:   Anti-infectives (From admission, onward)    None          Medications  aspirin  EC  81 mg Oral Daily   clopidogrel   75 mg Oral Daily   cyanocobalamin   1,000 mcg Oral Daily   DULoxetine   30 mg Oral Daily   empagliflozin   10 mg Oral Daily   furosemide   80 mg Intravenous Q12H   gabapentin   100 mg Oral TID   Gerhardt's butt cream   Topical TID   heparin   5,000 Units Subcutaneous Q8H   insulin  aspart  0-9 Units Subcutaneous TID WC   insulin  aspart  2 Units Subcutaneous TID WC   metoprolol  succinate  25 mg Oral Daily   mexiletine  200 mg Oral Q12H   nicotine   21 mg Transdermal Daily   pantoprazole   40 mg Oral Daily   rosuvastatin   40 mg Oral Daily   sodium chloride  flush  3 mL Intravenous Q12H      Subjective:   [name] was seen and examined today.  Overall improving, abdominal swelling and edema on the stumps improving.  No chest pain, no acute shortness of breath.  States abdominal discomfort, no nausea or vomiting, tolerating diet. Upset over BiPAP last night.  Objective:   Vitals:   09/01/23 0304 09/01/23 0815 09/01/23 1156 09/01/23 1201  BP: (!) 131/55 (!) 113/58  139/78  Pulse: 67 65 68 72  Resp: 13 18    Temp: 97.7 F (36.5 C)  (!) 97.5 F (36.4 C)   TempSrc: Oral  Oral   SpO2: 95% 99%    Weight: 65.3 kg     Height:        Intake/Output Summary (Last 24 hours) at 09/01/2023 1322 Last data filed at 09/01/2023 0813 Gross per 24 hour  Intake 840 ml  Output 2000 ml  Net -1160 ml     Wt Readings from Last 3 Encounters:  09/01/23 65.3 kg  04/30/23 86.6 kg  12/01/22 86.6 kg    Physical Exam General: Alert and oriented x 3, NAD Cardiovascular: S1 S2 clear, RRR.  Respiratory: Diminished breath sounds at the bases Gastrointestinal: Soft,  nontender, nondistended, NBS Ext: bilateral AKA Neuro: no new deficits Psych: Normal affect     Data Reviewed:  I have personally reviewed following labs    CBC Lab Results  Component Value Date   WBC 6.5 08/31/2023   RBC 3.28 (L) 08/31/2023   HGB 9.6 (L) 08/31/2023   HCT 29.8 (L) 08/31/2023   MCV 90.9 08/31/2023   MCH 29.3 08/31/2023   PLT 213 08/31/2023   MCHC 32.2 08/31/2023   RDW 17.2 (H) 08/31/2023  LYMPHSABS 2.0 08/24/2023   MONOABS 0.6 08/24/2023   EOSABS 0.1 08/24/2023   BASOSABS 0.1 08/24/2023     Last metabolic panel Lab Results  Component Value Date   NA 134 (L) 09/01/2023   K 4.2 09/01/2023   CL 88 (L) 09/01/2023   CO2 32 09/01/2023   BUN 45 (H) 09/01/2023   CREATININE 2.34 (H) 09/01/2023   GLUCOSE 102 (H) 09/01/2023   GFRNONAA 24 (L) 09/01/2023   GFRAA 77 (L) 09/06/2014   CALCIUM  9.3 09/01/2023   PHOS 3.3 01/29/2012   PROT 5.3 (L) 08/30/2023   ALBUMIN 2.4 (L) 08/30/2023   BILITOT 0.7 08/30/2023   ALKPHOS 92 08/30/2023   AST 21 08/30/2023   ALT 9 08/30/2023   ANIONGAP 14 09/01/2023    CBG (last 3)  Recent Labs    08/31/23 2037 09/01/23 0804 09/01/23 1155  GLUCAP 168* 163* 190*      Coagulation Profile: No results for input(s): INR, PROTIME in the last 168 hours.   Radiology Studies: I have personally reviewed the imaging studies  No results found.      Nydia Distance M.D. [facility] 09/01/2023, 1:22 PM  Available via Epic secure chat 7am-7pm After 7 pm, please refer to night coverage provider listed on amion.

## 2023-09-01 NOTE — Progress Notes (Addendum)
 Rounding Note    Patient Name: [name] Date of Encounter: 09/01/2023  [facility] Cardiologist: Jayson Sierras, MD   Subjective   Pt feels well this morning but is very upset at being forced to use CPAP last evening  Inpatient Medications    Scheduled Meds:  aspirin  EC  81 mg Oral Daily   clopidogrel   75 mg Oral Daily   cyanocobalamin   1,000 mcg Oral Daily   DULoxetine   30 mg Oral Daily   empagliflozin   10 mg Oral Daily   furosemide   80 mg Intravenous Q12H   gabapentin   100 mg Oral TID   Gerhardt's butt cream   Topical TID   heparin   5,000 Units Subcutaneous Q8H   insulin  aspart  0-9 Units Subcutaneous TID WC   insulin  aspart  2 Units Subcutaneous TID WC   metoprolol  succinate  25 mg Oral Daily   mexiletine  200 mg Oral Q12H   nicotine   21 mg Transdermal Daily   pantoprazole   40 mg Oral Daily   rosuvastatin   40 mg Oral Daily   sodium chloride  flush  3 mL Intravenous Q12H   Continuous Infusions:  PRN Meds: acetaminophen , ALPRAZolam , dextrose , dextrose , docusate sodium , ipratropium-albuterol , methocarbamol , naLOXone  (NARCAN )  injection, polyethylene glycol, sodium chloride  flush, traMADol    Vital Signs    Vitals:   08/31/23 1951 08/31/23 1953 08/31/23 2332 09/01/23 0304  BP: (!) 127/38 121/65  (!) 131/55  Pulse:    67  Resp:  15 13 13   Temp: 97.9 F (36.6 C)   97.7 F (36.5 C)  TempSrc: Oral   Oral  SpO2:  95% 96% 95%  Weight:    65.3 kg  Height:        Intake/Output Summary (Last 24 hours) at 09/01/2023 0755 Last data filed at 09/01/2023 0324 Gross per 24 hour  Intake 1260 ml  Output 1000 ml  Net 260 ml      09/01/2023    3:04 AM 08/31/2023    4:29 AM 08/30/2023    3:49 AM  Last 3 Weights  Weight (lbs) 143 lb 15.4 oz 152 lb 12.5 oz 162 lb 1.6 oz  Weight (kg) 65.3 kg 69.3 kg 73.528 kg      Telemetry    Sinus with frequent PVCs, episodes of bigeminy - Personally Reviewed  ECG    No new tracings - Personally Reviewed  Physical  Exam   GEN: No acute distress.   Neck: No JVD Cardiac: irregular rhythm, regular rate, 2/6 murmur Respiratory: Clear to auscultation bilaterally. GI: Soft, nontender, distension improved MS: B AKA Neuro:  Nonfocal  Psych: Normal affect   Labs    High Sensitivity Troponin:  No results for input(s): TROPONINIHS in the last 720 hours.   Chemistry Recent Labs  Lab 08/26/23 0345 08/27/23 0806 08/30/23 0414 08/31/23 0454 09/01/23 0442  NA 129*   < > 131* 133* 134*  K 4.5   < > 3.5 3.6 4.2  CL 96*   < > 91* 89* 88*  CO2 20*   < > 29 31 32  GLUCOSE 190*   < > 116* 144* 102*  BUN 35*   < > 43* 46* 45*  CREATININE 2.20*   < > 2.17* 2.18* 2.34*  CALCIUM  8.2*   < > 8.4* 8.6* 9.3  MG 2.1  --   --   --   --   PROT  --   --  5.3*  --   --  ALBUMIN  --   --  2.4*  --   --   AST  --   --  21  --   --   ALT  --   --  9  --   --   ALKPHOS  --   --  92  --   --   BILITOT  --   --  0.7  --   --   GFRNONAA 26*   < > 26* 26* 24*  ANIONGAP 13   < > 11 13 14    < > = values in this interval not displayed.    Lipids No results for input(s): CHOL, TRIG, HDL, LABVLDL, LDLCALC, CHOLHDL in the last 168 hours.  Hematology Recent Labs  Lab 08/29/23 0427 08/30/23 0414 08/31/23 0454  WBC 6.8 6.4 6.5  RBC 3.24* 3.22* 3.28*  HGB 9.5* 9.5* 9.6*  HCT 29.9* 29.5* 29.8*  MCV 92.3 91.6 90.9  MCH 29.3 29.5 29.3  MCHC 31.8 32.2 32.2  RDW 17.3* 17.2* 17.2*  PLT 181 184 213   Thyroid  No results for input(s): TSH, FREET4 in the last 168 hours.  BNPNo results for input(s): BNP, PROBNP in the last 168 hours.  DDimer No results for input(s): DDIMER in the last 168 hours.   Radiology    No results found.  Cardiac Studies   Echo 08/23/23:  1. Left ventricular ejection fraction, by estimation, is 30 to 35%. The  left ventricle has moderately decreased function. The left ventricle has  no regional wall motion abnormalities. Left ventricular diastolic function  could not be  evaluated.   2. Right ventricular systolic function is moderately reduced. The right  ventricular size is normal.   3. Left atrial size was severely dilated.   4. Right atrial size was moderately dilated.   5. The posterior leaflet appears restrcited. There is moderate to severe  eccentric MR. The mitral valve is normal in structure. Moderate to severe  mitral valve regurgitation. No evidence of mitral stenosis.   6. The aortic valve is tricuspid. There is moderate calcification of the  aortic valve. Aortic valve regurgitation is trivial. Mild aortic valve  stenosis.   7. The inferior vena cava is dilated in size with >50% respiratory  variability, suggesting right atrial pressure of 8 mmHg.      Patient Profile     53 y.o. female with a hx of chronic HFmrEF now with LVEF 30-35%, frequent PVCs, severe PAD s/p bilateral AKA, ongoing tobacco use, HLD, anxiety, depression, COPD, history of PE, CKD, type 2 diabetes who is being seen for the evaluation of CHF.   Assessment & Plan    Acute on chronic systolic and diastolic heart failure - History of LVEF 40% 01/2022 with improvement to LVEF 50-55% 06/2022. -She presented to [facility] ER with signs and symptoms of hypervolemia - Unclear if she has been compliant with medications and diet - BNP greater than 7000 and bilateral pleural effusions on CXR - She was transferred to [facility] for further workup - Repeat echocardiogram 08/21/2023 showed an LVEF 30-35% with global hypokinesis - Unclear etiology of cardiomyopathy but she is noted to have a rather high PVC burden - She denied angina, renal insufficiency currently precludes LHC -Nuclear stress test 08/2022 was negative for ischemia or prior infarction -Currently diuresing with 80 mg Lasix  IV twice daily plus metolazone  2.5 mg OTO yesterday with several unmeasured urine occurrences, weight is down 20 pounds from admission, 9 pounds overnight -- she feels  better this morning - remains on  O2 -- will continue 80 mg IV lasix  this morning,  hold this afternoon's dose -- GDMT limited by BP and renal function -- continue toporl and jardiance    Acute on chronic renal insufficiency IIIa-b -Creatinine bumped to 2.34 from 2.18 yesterday -Unclear baseline, was near 1.3-1.5 in 2023 K4.2   Frequent PVCs - Heart monitor 09/2022 showed a PVC burden of 23.7% - She is felt to be not a good candidate for ablation, has been maintained on propranolol  160 mg daily - Given acute systolic heart failure, beta-blocker transition to Toprol  25 mg daily, unable to titrate given blood pressure -EP consulted this admission and she has been started on mexiletine which has been increased to 200 mg twice daily -Given young age, trying to hold off on amiodarone; however, given co-morbid conditions, amiodarone may be her best option to improve quality of life   Moderate to severe MR - Not a surgical candidate   Anemia of chronic disease - Hemoglobin baseline felt likely near 9.5 - No signs of active bleeding - Continue aspirin    PAD s/p B AKA - continue ASA        For questions or updates, please contact [facility] Please consult [email or web address] for contact info under        Signed, Jon Nat Hails, PA  09/01/2023, 7:55 AM    Personally seen and examined. Agree with above.  Feels a bit better now  Bilateral AKA's noted.  Lungs with minimal crackles bilaterally Frequent PVCs-on mexiletine started by Dr. Kennyth with EP.  Dose increased to 200 mg approximately 3 days ago.  Slightly less burden of PVCs.  Encouraging.  Avoiding amiodarone at this point.  Her weight is down 20 pounds since admission.  We are gena continue with Lasix  80 mg IV this morning.  She received metolazone  yesterday.  She may be getting close to discharge.  Creatinine slightly increased from 2.18-2.34.  Bilateral AKA's.  Moderate to severe MR not surgical candidate.

## 2023-09-01 NOTE — Progress Notes (Signed)
 Heart Failure Stewardship Pharmacist Progress Note   PCP: Jeanette Comer BRAVO, PA-C PCP-Cardiologist: Jayson Sierras, MD    HPI:  53 yo F with PMH of HFmrEF, frequent PVCs, severe PAD s/p bilateral AKA, tobacco use disorder, hyperlipidemia, anxiety, depression, COPD, hx of PE, type 2 diabetes. History of ECHO with EF 40% in 07/2021, improved to 55% in 07/2022. Last ECHO on 04/2023 with EF 45-50%, elevated LVEDP, normal RV function, small pericardial effusion, mild MR, mild aortic valve regurgitation. Has followed with EP as outpatient for PVCs, PACs, brief episodes of NSVT on long-term monitor. Felt not to be a candidate for ablation and her metoprolol  was transitioned to propranolol .   Patient presented to [facility] ED on 1/24 with worsening SOB, abdominal distention, and LEE. Chest x-ray showed bilateral pleural effusions, BNP>7000. She was transferred to [facility] on 1/25. Once at [facility], BNP >4500. Patient somnolent with worsening wheezing on exam, noted she was given multiple doses of morphine  in [place]. Narcan  administered at [facility]. Also noted to be hypoglycemic, started on dextrose . Found to have acute respiratory and metabolic acidosis and was started on BiPAP. Initiated diuresis with IV lasix , home GDMT held due to hypotension and acute respiratory and metabolic acidosis. New ECHO 1/27 with LVEF 30-35%, no RWMA, RV moderately reduced, moderate to severe concentric MR. Jardiance  restarted on 1/28.  Patient reports that Jardiance  and Entresto  were stopped during last hospitalization at [facility], although both of these were filled on 08/04/23. Per care everywhere, it does look like Entresto  was temporarily stopped due to acute renal failure during recent hospitalization 06/2023 at [facility]. Patient also with hypoglycemia during last hospitalization.   Patient very uncomfortable and expressing severe abdominal pain. Difficult for her to answer questions but states this has been going on for a while and this is  not the first time its happened in the hospital. Informed MD and RN.   Current HF Medications: Diuretic: furosemide  80 mg IV BID Beta Blocker: metoprolol  XL 25 mg daily SGLT2i: Jardiance  10 mg daily  Prior to admission HF Medications: Beta blocker: propranolol  ER 160 mg daily ACE/ARB/ARNI: Entresto  24/26 mg BID SGLT2i: Jardiance  25 mg daily  Pertinent Lab Values: Serum creatinine 2.34, BUN 45, Potassium 4.2, Sodium 134, BNP >4500, Magnesium  2.1, A1c 7.6  Vital Signs: Weight: 143 lbs (admission weight: 164 lbs) - bed weights Blood pressure: 110-130/50s  Heart rate: 60s I/O: net +0.3 L yesterday; net -7.3 L since admission  Medication Assistance / Insurance Benefits Check: Does the patient have prescription insurance?  Yes Type of insurance plan: Medicare + Medicaid  Outpatient Pharmacy:  Prior to admission outpatient pharmacy: [facility] Is the patient willing to use [facility] TOC pharmacy at discharge? Yes Is the patient willing to transition their outpatient pharmacy to utilize a [facility] outpatient pharmacy?   No    Assessment:  1. Acute on chronic HFmrEF (LVEF 30-35%), due to unclear etiology. NYHA class II symptoms. - Continue furosemide  80 mg IV BID. Output not well documented yesterday. But weight down 21 lbs since admission. Creatinine up 2.18>2.34 today. - Continue metoprolol  XL 25 mg daily  - Continue Jardiance  10 mg daily - Continue to hold off on Entresto  with AKI - Daily weights, strict I/Os, keep K>4 and Mg>2.    Plan: 1) Medication changes recommended at this time: - Continue IV diuresis  2) Patient assistance: - Patient has Medicare + Medicaid - Entresto /Jardiance  $0  3)  Education  - Patient has been educated on current HF medications  and potential additions to HF medication regimen - Patient verbalizes understanding that over the next few months, these medication doses may change and more medications may be added to optimize HF regimen - Patient has been  educated on basic disease state pathophysiology and goals of therapy   Duwaine Plant, PharmD, BCPS Heart Failure Stewardship Pharmacist Phone [phone]

## 2023-09-02 DIAGNOSIS — I10 Essential (primary) hypertension: Secondary | ICD-10-CM | POA: Diagnosis not present

## 2023-09-02 DIAGNOSIS — N183 Chronic kidney disease, stage 3 unspecified: Secondary | ICD-10-CM | POA: Diagnosis not present

## 2023-09-02 DIAGNOSIS — E1169 Type 2 diabetes mellitus with other specified complication: Secondary | ICD-10-CM | POA: Diagnosis not present

## 2023-09-02 DIAGNOSIS — I5023 Acute on chronic systolic (congestive) heart failure: Secondary | ICD-10-CM | POA: Diagnosis not present

## 2023-09-02 LAB — GLUCOSE, CAPILLARY
Glucose-Capillary: 133 mg/dL — ABNORMAL HIGH (ref 70–99)
Glucose-Capillary: 213 mg/dL — ABNORMAL HIGH (ref 70–99)
Glucose-Capillary: 184 mg/dL — ABNORMAL HIGH (ref 70–99)
Glucose-Capillary: 178 mg/dL — ABNORMAL HIGH (ref 70–99)

## 2023-09-02 LAB — BASIC METABOLIC PANEL
Anion gap: 15 (ref 5–15)
BUN: 45 mg/dL — ABNORMAL HIGH (ref 6–20)
CO2: 31 mmol/L (ref 22–32)
Calcium: 8.8 mg/dL — ABNORMAL LOW (ref 8.9–10.3)
Chloride: 84 mmol/L — ABNORMAL LOW (ref 98–111)
Creatinine, Ser: 2.35 mg/dL — ABNORMAL HIGH (ref 0.44–1.00)
GFR, Estimated: 24 mL/min — ABNORMAL LOW (ref >60)
Glucose, Bld: 114 mg/dL — ABNORMAL HIGH (ref 70–99)
Potassium: 3.8 mmol/L (ref 3.5–5.1)
Sodium: 130 mmol/L — ABNORMAL LOW (ref 135–145)

## 2023-09-02 MED ORDER — FUROSEMIDE 40 MG PO TABS
80.0000 mg | ORAL_TABLET | Freq: Every day | ORAL | Status: DC
  Administered 2023-09-03: 80 mg via ORAL
  Filled 2023-09-02: qty 2

## 2023-09-02 NOTE — Progress Notes (Signed)
 [facility]                                                                              [name], is a 53 y.o. female, DOB - Jan 10, 1971, FMW:[id number] Admit date - 08/21/2023    Outpatient Primary MD for the patient is Skillman, Katherine E, PA-C  LOS - 12  days  No chief complaint on file.      Brief summary   Patient is a 53 year old female with CHF, CAD, diabetes mellitus type 2, HTN, bilateral BKA and CKD stage 3 presented with progressive shortness of breath, abdominal distention, lower extremity edema.   She was evaluated at [facility] ED, she was diagnosed with volume overload and was transferred to [facility] for further treatment and diagnosis.  At the time of her transfer she was somnolent, blood pressure was 94/61, HR 80, RR 12 and 02 saturation 93% on 3 L/min per [place].  BNP > 4500, creatinine 1.7 Chest x-ray showed cardiomegaly with bilateral hilar vascular congestion, cephalization of the vasculature, bilateral pleural effusions.  Significant events 01/26: Lethargic, hypoglycemia, started on BiPAP, acute respiratory and metabolic acidosis  01/27: Persistent hypoglycemia, respiratory acidosis improved, no lactic acidosis.  Placed on D5, then D10 infusion  01/28: mentation back to baseline, D10 discontinued.  On diuresis.  01/30: Clinically improving   Assessment & Plan    Principal Problem:   Acute on chronic systolic and diastolic combined CHF (congestive heart failure) (HCC) Moderate to severe mitral regurgitation, mild aortic insufficiency -2D echo showed EF 30 to 35%, RV systolic function with moderate reduction, LA with severe dilation, RA with moderate dilation, moderate to severe MR, aortic valve stenosis, trivial pericardial effusion -Continue Jardiance , Toprol -XL -Continue to hold Entresto , amlodipine   -Cardiology following.  Renal function limits aggressive diuresis.  Received IV Lasix  80 mg twice daily with metolazone . -On Lasix  80 mg IV  twice daily, received IV dose this morning, transition to oral Lasix  today -Negative balance of 9.6 L, -Weight down from 164.46 lbs on admission -> 184.3 on 08/27/2023-> 145 lbs today     Acute hypoxemic and hypercapnic respiratory failure due to cardiogenic pulmonary edema. -Improved ventilation with BiPAP on admission -Currently on 2 L  Abdominal pain -No nausea vomiting, eating breakfast without difficulty, reports abdominal pain for a while -Feeling better today, continue Vantin. -CT abdomen + anasarca otherwise no acute abdominal pathology  Frequent PVCs -Followed by EP cardiology, placed on Toprol -XL this admission - started on mexiletine 200 mg every 12 hours on 2/1    Acute on CKD (chronic kidney disease) stage 3b, GFR 30-59 ml/min (HCC) -Baseline creatinine 1.5 - Renal US  with no hydronephrosis, with small amount of free fluid in the right upper quadrant.  -Creatinine 2.3, IV Lasix  transition to p.o. today   Hypertension -BP stable, continue metoprolol , Lasix     Type 2 diabetes mellitus with hyperlipidemia (HCC) Hypoglycemia may have acute metabolic encephalopathy -Initially hypoglycemia requiring D5 and D10 infusion.  CBG (last 3)  Recent Labs    09/01/23 2117 09/02/23 0736 09/02/23 1144  GLUCAP 163* 133* 213*   -Changed to sliding scale insulin , moderate, NovoLog  3 units  3 times daily AC   Acute metabolic encephalopathy, diabetic neuropathy -Mental status improved, alert and oriented x 3, at baseline  -Continue Neurontin  100 mg 3 times daily, Cymbalta  30 mg daily, Robaxin  as needed -Continue tramadol  as needed  PAD -Continue aspirin , statin    COPD (chronic obstructive pulmonary disease) (HCC) No signs of acute exacerbation, continue bronchodilator therapy.    Estimated body mass index is 23.52 kg/m as calculated from the following:   Height as of this encounter: 5' 6 (1.676 m).   Weight as of this encounter: 66.1 kg.  Code Status: full  DVT  Prophylaxis:  heparin  injection 5,000 Units Start: 08/21/23 2315   Level of Care: Level of care: Progressive Family Communication:  Disposition Plan:      Remains inpatient appropriate:   Patient wants to go home, declining SNF.  Hopefully DC home in a.m.   Procedures:   Consultants:   Cardiology  Antimicrobials:   Anti-infectives (From admission, onward)    None          Medications  aspirin  EC  81 mg Oral Daily   clopidogrel   75 mg Oral Daily   cyanocobalamin   1,000 mcg Oral Daily   dicyclomine   10 mg Oral TID AC   DULoxetine   30 mg Oral Daily   empagliflozin   10 mg Oral Daily   [START ON 09/03/2023] furosemide   80 mg Oral Daily   gabapentin   100 mg Oral TID   Gerhardt's butt cream   Topical TID   heparin   5,000 Units Subcutaneous Q8H   insulin  aspart  0-15 Units Subcutaneous TID WC   insulin  aspart  3 Units Subcutaneous TID WC   metoprolol  succinate  25 mg Oral Daily   mexiletine  200 mg Oral Q12H   nicotine   21 mg Transdermal Daily   pantoprazole   40 mg Oral Daily   rosuvastatin   40 mg Oral Daily   sodium chloride  flush  3 mL Intravenous Q12H      Subjective:   [name] was seen and examined today.  Overall improving, abdominal pain better today.  No nausea vomiting, tolerating diet.  Did not sleep well last night. Objective:   Vitals:   09/01/23 1954 09/02/23 0354 09/02/23 0740 09/02/23 1146  BP: (!) 137/54 (!) 121/49 123/67 (!) 116/50  Pulse: 69 72 74 78  Resp: 16 16  16   Temp: (!) 97.5 F (36.4 C) 97.8 F (36.6 C)  97.7 F (36.5 C)  TempSrc: Oral Oral  Oral  SpO2: 100% 98% 96% 95%  Weight:  66.1 kg    Height:        Intake/Output Summary (Last 24 hours) at 09/02/2023 1219 Last data filed at 09/02/2023 0537 Gross per 24 hour  Intake 118 ml  Output 2550 ml  Net -2432 ml     Wt Readings from Last 3 Encounters:  09/02/23 66.1 kg  04/30/23 86.6 kg  12/01/22 86.6 kg   Physical Exam General: Alert and oriented x 3, NAD Cardiovascular:  S1 S2 clear, RRR.  Respiratory: CTAB, no wheezing Gastrointestinal: Soft, nontender, nondistended, NBS Ext: BKA with slight edema on the right stump. Neuro: no new deficits Psych: Normal affect     Data Reviewed:  I have personally reviewed following labs    CBC Lab Results  Component Value Date   WBC 6.5 08/31/2023   RBC 3.28 (L) 08/31/2023   HGB 9.6 (L) 08/31/2023   HCT 29.8 (L) 08/31/2023   MCV 90.9 08/31/2023   MCH  29.3 08/31/2023   PLT 213 08/31/2023   MCHC 32.2 08/31/2023   RDW 17.2 (H) 08/31/2023   LYMPHSABS 2.0 08/24/2023   MONOABS 0.6 08/24/2023   EOSABS 0.1 08/24/2023   BASOSABS 0.1 08/24/2023     Last metabolic panel Lab Results  Component Value Date   NA 130 (L) 09/02/2023   K 3.8 09/02/2023   CL 84 (L) 09/02/2023   CO2 31 09/02/2023   BUN 45 (H) 09/02/2023   CREATININE 2.35 (H) 09/02/2023   GLUCOSE 114 (H) 09/02/2023   GFRNONAA 24 (L) 09/02/2023   GFRAA 77 (L) 09/06/2014   CALCIUM  8.8 (L) 09/02/2023   PHOS 3.3 01/29/2012   PROT 5.3 (L) 08/30/2023   ALBUMIN 2.4 (L) 08/30/2023   BILITOT 0.7 08/30/2023   ALKPHOS 92 08/30/2023   AST 21 08/30/2023   ALT 9 08/30/2023   ANIONGAP 15 09/02/2023    CBG (last 3)  Recent Labs    09/01/23 2117 09/02/23 0736 09/02/23 1144  GLUCAP 163* 133* 213*      Coagulation Profile: No results for input(s): INR, PROTIME in the last 168 hours.   Radiology Studies: I have personally reviewed the imaging studies  CT ABDOMEN PELVIS WO CONTRAST Result Date: 09/01/2023 CLINICAL DATA:  Acute nonlocalized abdominal pain EXAM: CT ABDOMEN AND PELVIS WITHOUT CONTRAST TECHNIQUE: Multidetector CT imaging of the abdomen and pelvis was performed following the standard protocol without IV contrast. RADIATION DOSE REDUCTION: This exam was performed according to the departmental dose-optimization program which includes automated exposure control, adjustment of the mA and/or kV according to patient size and/or use of iterative  reconstruction technique. COMPARISON:  08/20/2023 FINDINGS: Lower chest: Small right pleural effusion is again seen, however, there is developing right basilar consolidation in keeping with developing lobar pneumonia or aspiration. Extensive multi-vessel coronary artery calcification. Global cardiac size within normal limits. Hypoattenuation of the cardiac blood pool noted in keeping with at least mild anemia. Hepatobiliary: No focal liver abnormality is seen. Status post cholecystectomy. No biliary dilatation. Pancreas: Unremarkable Spleen: Unremarkable Adrenals/Urinary Tract: Adrenal glands are unremarkable. Moderate asymmetric right renal cortical atrophy. Left kidney is normal in size and position. No hydronephrosis. No intrarenal or ureteral calculi. 11 mm macroscopic fat containing exophytic lesion arises from the upper pole the right kidney compatible with a small renal angiomyolipoma, unchanged prior examination. The bladder is unremarkable. Stomach/Bowel: Mild ascites. Stomach, small bowel, and large bowel are unremarkable. No evidence of obstruction or focal inflammation. Status post appendectomy. No free intraperitoneal gas. Vascular/Lymphatic: Extensive aortoiliac atherosclerotic calcification. No aortic aneurysm. No pathologic adenopathy within the abdomen and pelvis. Right femoral bypass grafting is partially visualized. Reproductive: Uterus and bilateral adnexa are unremarkable. Other: Moderate subcutaneous body wall edema. No abdominal wall hernia. Musculoskeletal: No acute bone abnormality. No lytic or blastic bone lesion. IMPRESSION: 1. No acute intra-abdominal pathology identified. 2. Small right pleural effusion with developing right basilar consolidation in keeping with developing lobar pneumonia or aspiration. 3. Extensive multi-vessel coronary artery calcification. 4. Moderate asymmetric right renal cortical atrophy. 5. Mild ascites. Moderate subcutaneous body wall edema. Imaging findings are  in keeping with anasarca. 6. 11 mm right renal angiomyolipoma, unchanged from prior examination. Aortic Atherosclerosis (ICD10-I70.0). Electronically Signed   By: Dorethia Molt M.D.   On: 09/01/2023 22:52        Zebulin Siegel M.D. [facility] 09/02/2023, 12:19 PM  Available via Epic secure chat 7am-7pm After 7 pm, please refer to night coverage provider listed on amion.

## 2023-09-02 NOTE — Progress Notes (Signed)
 Oxygen saturation while laying sitting is 88-92

## 2023-09-02 NOTE — TOC Progression Note (Signed)
 Transition of Care [facility]) - Progression Note    Patient Details  Name: [name] MRN: [id number] Date of Birth: Aug 02, 1970  [facility] CM/SW Contact  Doneta Glenys DASEN, RN Phone Number: 09/02/2023, 3:06 PM  Clinical Narrative:    Followed up with patient to inform her she is scheduled for discharge tomorrow. Patients states she is currently receiving services from [facility] (RN & PT 2x weekly). Case Manager called and informed [facility] (April) that patient is scheduled for discharge tomorrow.  Discharge summary will need to be faxed [phone]) to [facility]. Patient states that [name]) will transport home via private vehicle.    Expected Discharge Plan: Home/Self Care Barriers to Discharge: Continued Medical Work up  Expected Discharge Plan and Services In-house Referral: NA Discharge Planning Services: CM Consult   Living arrangements for the past 2 months: Single Family Home                                       Social Determinants of Health (SDOH) Interventions SDOH Screenings   Food Insecurity: No Food Insecurity (08/23/2023)  Housing: Low Risk  (08/23/2023)  Transportation Needs: No Transportation Needs (08/23/2023)  Utilities: Not At Risk (08/23/2023)  Alcohol  Screen: Low Risk  (03/18/2022)  Depression (PHQ2-9): Medium Risk (03/18/2022)  Financial Resource Strain: Low Risk  (07/23/2023)   Received from [facility]  Physical Activity: Inactive (07/23/2023)   Received from [facility]  Social Connections: Socially Isolated (07/23/2023)   Received from [facility]  Stress: No Stress Concern Present (07/23/2023)   Received from [facility]  Tobacco Use: High Risk (08/24/2023)  Health Literacy: Inadequate Health Literacy (08/04/2023)   Received from [facility]    Readmission Risk Interventions     No data to display

## 2023-09-02 NOTE — Progress Notes (Addendum)
 Rounding Note    Patient Name: [name] Date of Encounter: 09/02/2023  [facility] Cardiologist: Jayson Sierras, MD   Subjective   Pt feels well this morning, slight swelling in right stump She did not sleep last night  Inpatient Medications    Scheduled Meds:  aspirin  EC  81 mg Oral Daily   clopidogrel   75 mg Oral Daily   cyanocobalamin   1,000 mcg Oral Daily   dicyclomine   10 mg Oral TID AC   DULoxetine   30 mg Oral Daily   empagliflozin   10 mg Oral Daily   furosemide   80 mg Intravenous Q12H   gabapentin   100 mg Oral TID   Gerhardt's butt cream   Topical TID   heparin   5,000 Units Subcutaneous Q8H   insulin  aspart  0-15 Units Subcutaneous TID WC   insulin  aspart  3 Units Subcutaneous TID WC   metoprolol  succinate  25 mg Oral Daily   mexiletine  200 mg Oral Q12H   nicotine   21 mg Transdermal Daily   pantoprazole   40 mg Oral Daily   rosuvastatin   40 mg Oral Daily   sodium chloride  flush  3 mL Intravenous Q12H   Continuous Infusions:  PRN Meds: acetaminophen , ALPRAZolam , dextrose , dextrose , docusate sodium , ipratropium-albuterol , methocarbamol , naLOXone  (NARCAN )  injection, polyethylene glycol, sodium chloride  flush, traMADol    Vital Signs    Vitals:   09/01/23 1900 09/01/23 1954 09/02/23 0354 09/02/23 0740  BP:  (!) 137/54 (!) 121/49 123/67  Pulse:  69 72 74  Resp: 13 16 16    Temp:  (!) 97.5 F (36.4 C) 97.8 F (36.6 C)   TempSrc:  Oral Oral   SpO2: 100% 100% 98% 96%  Weight:   66.1 kg   Height:        Intake/Output Summary (Last 24 hours) at 09/02/2023 0953 Last data filed at 09/02/2023 0537 Gross per 24 hour  Intake 118 ml  Output 2550 ml  Net -2432 ml      09/02/2023    3:54 AM 09/01/2023    3:04 AM 08/31/2023    4:29 AM  Last 3 Weights  Weight (lbs) 145 lb 11.6 oz 143 lb 15.4 oz 152 lb 12.5 oz  Weight (kg) 66.1 kg 65.3 kg 69.3 kg      Telemetry    Sinus rhythm with PVCs, trigeminy, HR 70s - Personally Reviewed  ECG     No new  tracings - Personally Reviewed  Physical Exam   GEN: No acute distress.   Neck: No JVD Cardiac: RRR, no murmurs, rubs, or gallops.  Respiratory: Clear to auscultation bilaterally. GI: Soft, nontender, non-distended - improved from yesterday  MS: B AKA - slight edema on right stump Neuro:  Nonfocal  Psych: Normal affect   Labs    High Sensitivity Troponin:  No results for input(s): TROPONINIHS in the last 720 hours.   Chemistry Recent Labs  Lab 08/30/23 0414 08/31/23 0454 09/01/23 0442 09/02/23 0433  NA 131* 133* 134* 130*  K 3.5 3.6 4.2 3.8  CL 91* 89* 88* 84*  CO2 29 31 32 31  GLUCOSE 116* 144* 102* 114*  BUN 43* 46* 45* 45*  CREATININE 2.17* 2.18* 2.34* 2.35*  CALCIUM  8.4* 8.6* 9.3 8.8*  PROT 5.3*  --   --   --   ALBUMIN 2.4*  --   --   --   AST 21  --   --   --   ALT 9  --   --   --  ALKPHOS 92  --   --   --   BILITOT 0.7  --   --   --   GFRNONAA 26* 26* 24* 24*  ANIONGAP 11 13 14 15     Lipids No results for input(s): CHOL, TRIG, HDL, LABVLDL, LDLCALC, CHOLHDL in the last 168 hours.  Hematology Recent Labs  Lab 08/29/23 0427 08/30/23 0414 08/31/23 0454  WBC 6.8 6.4 6.5  RBC 3.24* 3.22* 3.28*  HGB 9.5* 9.5* 9.6*  HCT 29.9* 29.5* 29.8*  MCV 92.3 91.6 90.9  MCH 29.3 29.5 29.3  MCHC 31.8 32.2 32.2  RDW 17.3* 17.2* 17.2*  PLT 181 184 213   Thyroid  No results for input(s): TSH, FREET4 in the last 168 hours.  BNPNo results for input(s): BNP, PROBNP in the last 168 hours.  DDimer No results for input(s): DDIMER in the last 168 hours.   Radiology    CT ABDOMEN PELVIS WO CONTRAST Result Date: 09/01/2023 CLINICAL DATA:  Acute nonlocalized abdominal pain EXAM: CT ABDOMEN AND PELVIS WITHOUT CONTRAST TECHNIQUE: Multidetector CT imaging of the abdomen and pelvis was performed following the standard protocol without IV contrast. RADIATION DOSE REDUCTION: This exam was performed according to the departmental dose-optimization program which  includes automated exposure control, adjustment of the mA and/or kV according to patient size and/or use of iterative reconstruction technique. COMPARISON:  08/20/2023 FINDINGS: Lower chest: Small right pleural effusion is again seen, however, there is developing right basilar consolidation in keeping with developing lobar pneumonia or aspiration. Extensive multi-vessel coronary artery calcification. Global cardiac size within normal limits. Hypoattenuation of the cardiac blood pool noted in keeping with at least mild anemia. Hepatobiliary: No focal liver abnormality is seen. Status post cholecystectomy. No biliary dilatation. Pancreas: Unremarkable Spleen: Unremarkable Adrenals/Urinary Tract: Adrenal glands are unremarkable. Moderate asymmetric right renal cortical atrophy. Left kidney is normal in size and position. No hydronephrosis. No intrarenal or ureteral calculi. 11 mm macroscopic fat containing exophytic lesion arises from the upper pole the right kidney compatible with a small renal angiomyolipoma, unchanged prior examination. The bladder is unremarkable. Stomach/Bowel: Mild ascites. Stomach, small bowel, and large bowel are unremarkable. No evidence of obstruction or focal inflammation. Status post appendectomy. No free intraperitoneal gas. Vascular/Lymphatic: Extensive aortoiliac atherosclerotic calcification. No aortic aneurysm. No pathologic adenopathy within the abdomen and pelvis. Right femoral bypass grafting is partially visualized. Reproductive: Uterus and bilateral adnexa are unremarkable. Other: Moderate subcutaneous body wall edema. No abdominal wall hernia. Musculoskeletal: No acute bone abnormality. No lytic or blastic bone lesion. IMPRESSION: 1. No acute intra-abdominal pathology identified. 2. Small right pleural effusion with developing right basilar consolidation in keeping with developing lobar pneumonia or aspiration. 3. Extensive multi-vessel coronary artery calcification. 4. Moderate  asymmetric right renal cortical atrophy. 5. Mild ascites. Moderate subcutaneous body wall edema. Imaging findings are in keeping with anasarca. 6. 11 mm right renal angiomyolipoma, unchanged from prior examination. Aortic Atherosclerosis (ICD10-I70.0). Electronically Signed   By: Dorethia Molt M.D.   On: 09/01/2023 22:52    Cardiac Studies   Echo 08/23/23:  1. Left ventricular ejection fraction, by estimation, is 30 to 35%. The  left ventricle has moderately decreased function. The left ventricle has  no regional wall motion abnormalities. Left ventricular diastolic function  could not be evaluated.   2. Right ventricular systolic function is moderately reduced. The right  ventricular size is normal.   3. Left atrial size was severely dilated.   4. Right atrial size was moderately dilated.   5. The posterior  leaflet appears restrcited. There is moderate to severe  eccentric MR. The mitral valve is normal in structure. Moderate to severe  mitral valve regurgitation. No evidence of mitral stenosis.   6. The aortic valve is tricuspid. There is moderate calcification of the  aortic valve. Aortic valve regurgitation is trivial. Mild aortic valve  stenosis.   7. The inferior vena cava is dilated in size with >50% respiratory  variability, suggesting right atrial pressure of 8 mmHg.   Patient Profile     53 y.o. female  with a hx of chronic HFmrEF now with LVEF 30-35%, frequent PVCs, severe PAD s/p bilateral AKA, ongoing tobacco use, HLD, anxiety, depression, COPD, history of PE, CKD, type 2 diabetes who is being seen for the evaluation of CHF.   Assessment & Plan    Acute on chronic systolic and diastolic heart failure Hx of LVEF 40% with improvement in LVEF to 50-55% 06/2022. She presented to [facility] ER with S/S of hypervolemia, unclear compliance with medication and diet.  - BNP greater than 7000 and bilateral pleural effusions on CXR - she was transferred to [facility] for further  workup - repeat echo 08/21/23 with LVEF 30-35% with global hypokinesis - unclear etiology but could be high PVC burden - she denies angina, renal function precludes LHC - reassuring nuclear stress test 08/2022 without ischemia or prior infarction - currently diuresing on 80 mg IV lasix  BID  - sCr remains stable at 2.34 and K 4.2 this morning - urine output 3.5 L with unmeasured occurrence - weight stable at 145 lbs (was 143 lbs yesterday) - will switch 80 mg IV lasix  to PO 80 mg daily at home - continue toprol , entresto  24-26 mg BID, 10 mg jardiance  (CrCl < 30) - BMP in 1 week - diuresed ~20 lbs this admission   PVCs - 23.7% burden on heart monitor - still having trigeminy - continue 200 mg mexilitine BID - EP trying to avoid amiodarone - consider placing another heart monitor to see if PVC burden is reduced   Moderate to severe MR - not a surgical candidate   Acute on chronic kidney disease -unclear baseline, was 1.3-1.5 in 2023 - sCr stable at 2.3   Anemia of chronic disease No signs of active bleeding Continue DAPT   PAD s/p B AKA - continue DAPT   I will arrange cardiology follow up.     For questions or updates, please contact [facility] Please consult [email or web address] for contact info under        Signed, Jon Nat Hails, PA  09/02/2023, 9:53 AM    Personally seen and examined. Agree with above.  Agree with transition to PO lasix  as above - EF55% Continue mexilitine per EP. PVC's have mildly improved.  Bilat AKA's  OK for DC  Will sign off and obtain follow up High risk for re admit.   Oneil Parchment, MD

## 2023-09-02 NOTE — Progress Notes (Signed)
 Heart Failure Stewardship Pharmacist Progress Note   PCP: Jeanette Comer BRAVO, PA-C PCP-Cardiologist: Jayson Sierras, MD    HPI:  53 yo F with PMH of HFmrEF, frequent PVCs, severe PAD s/p bilateral AKA, tobacco use disorder, hyperlipidemia, anxiety, depression, COPD, hx of PE, type 2 diabetes. History of ECHO with EF 40% in 07/2021, improved to 55% in 07/2022. Last ECHO on 04/2023 with EF 45-50%, elevated LVEDP, normal RV function, small pericardial effusion, mild MR, mild aortic valve regurgitation. Has followed with EP as outpatient for PVCs, PACs, brief episodes of NSVT on long-term monitor. Felt not to be a candidate for ablation and her metoprolol  was transitioned to propranolol .   Patient presented to [facility] ED on 1/24 with worsening SOB, abdominal distention, and LEE. Chest x-ray showed bilateral pleural effusions, BNP>7000. She was transferred to [facility] on 1/25. Once at [facility], BNP >4500. Patient somnolent with worsening wheezing on exam, noted she was given multiple doses of morphine  in [place]. Narcan  administered at [facility]. Also noted to be hypoglycemic, started on dextrose . Found to have acute respiratory and metabolic acidosis and was started on BiPAP. Initiated diuresis with IV lasix , home GDMT held due to hypotension and acute respiratory and metabolic acidosis. New ECHO 1/27 with LVEF 30-35%, no RWMA, RV moderately reduced, moderate to severe concentric MR. Jardiance  restarted on 1/28.  Patient reports that Jardiance  and Entresto  were stopped during last hospitalization at [facility], although both of these were filled on 08/04/23. Per care everywhere, it does look like Entresto  was temporarily stopped due to acute renal failure during recent hospitalization 06/2023 at [facility]. Patient also with hypoglycemia during last hospitalization.   Does not feel like her breathing is back to baseline yet. Still with edema on her thighs. Has been on and off supplemental oxygen. Is more alert today compared  to yesterday.    Current HF Medications: Diuretic: furosemide  80 mg IV BID Beta Blocker: metoprolol  XL 25 mg daily SGLT2i: Jardiance  10 mg daily  Prior to admission HF Medications: Beta blocker: propranolol  ER 160 mg daily ACE/ARB/ARNI: Entresto  24/26 mg BID SGLT2i: Jardiance  25 mg daily  Pertinent Lab Values: Serum creatinine 2.35, BUN 45, Potassium 3.8, Sodium 130, BNP >4500, Magnesium  2.1, A1c 7.6  Vital Signs: Weight: 145 lbs (admission weight: 164 lbs) - bed weights Blood pressure: 120/50s  Heart rate: 70s I/O: net -3.1L yesterday; net -9.7L since admission  Medication Assistance / Insurance Benefits Check: Does the patient have prescription insurance?  Yes Type of insurance plan: Medicare + Medicaid  Outpatient Pharmacy:  Prior to admission outpatient pharmacy: [facility] Is the patient willing to use [facility] TOC pharmacy at discharge? Yes Is the patient willing to transition their outpatient pharmacy to utilize a [facility] outpatient pharmacy?   No    Assessment:  1. Acute on chronic HFmrEF (LVEF 30-35%), due to unclear etiology. NYHA class II symptoms. - Continue furosemide  80 mg IV BID. Creatinine remains elevated but stable from yesterday.  - Continue metoprolol  XL 25 mg daily  - Continue Jardiance  10 mg daily - Continue to hold off on Entresto  and spironolactone with AKI - Daily weights, strict I/Os, keep K>4 and Mg>2.    Plan: 1) Medication changes recommended at this time: - Continue IV diuresis   2) Patient assistance: - Patient has Medicare + Medicaid - Entresto /Jardiance  $0  3)  Education  - Patient has been educated on current HF medications and potential additions to HF medication regimen - Patient verbalizes understanding that over the next  few months, these medication doses may change and more medications may be added to optimize HF regimen - Patient has been educated on basic disease state pathophysiology and goals of therapy   Duwaine Plant,  PharmD, BCPS Heart Failure Stewardship Pharmacist Phone [phone]

## 2023-09-02 NOTE — Progress Notes (Signed)
   09/02/23 2044  BiPAP/CPAP/SIPAP  $ Non-Invasive Ventilator  Non-Invasive Vent Subsequent  BiPAP/CPAP/SIPAP Pt Type Adult  BiPAP/CPAP/SIPAP SERVO  Reason BIPAP/CPAP not in use Non-compliant (pt refused)  Patient Home Equipment No  Press High Alarm 30 cmH2O  BiPAP/CPAP /SiPAP Vitals  Pulse Rate 76  Resp 12  SpO2 99 %  Bilateral Breath Sounds Clear;Diminished  MEWS Score/Color  MEWS Score 1  MEWS Score Color Green

## 2023-09-02 NOTE — Plan of Care (Signed)

## 2023-09-03 DIAGNOSIS — I5023 Acute on chronic systolic (congestive) heart failure: Secondary | ICD-10-CM | POA: Diagnosis not present

## 2023-09-03 LAB — GLUCOSE, CAPILLARY: Glucose-Capillary: 122 mg/dL — ABNORMAL HIGH (ref 70–99)

## 2023-09-03 MED ORDER — MEXILETINE HCL 200 MG PO CAPS: 200.0000 mg | ORAL_CAPSULE | Freq: Two times a day (BID) | ORAL | 0 refills | Status: DC

## 2023-09-03 MED ORDER — METOPROLOL SUCCINATE ER 25 MG PO TB24: 25.0000 mg | ORAL_TABLET | Freq: Every day | ORAL | 0 refills | Status: DC

## 2023-09-03 MED ORDER — EMPAGLIFLOZIN 10 MG PO TABS: 10.0000 mg | ORAL_TABLET | Freq: Every day | ORAL | 0 refills | Status: AC

## 2023-09-03 MED ORDER — GABAPENTIN 100 MG PO CAPS: 100.0000 mg | ORAL_CAPSULE | Freq: Three times a day (TID) | ORAL | 0 refills | Status: DC

## 2023-09-03 MED ORDER — FUROSEMIDE 80 MG PO TABS: 80.0000 mg | ORAL_TABLET | Freq: Every day | ORAL | 0 refills | Status: DC

## 2023-09-03 NOTE — Progress Notes (Signed)
 Heart Failure Stewardship Pharmacist Progress Note   PCP: Jeanette Comer BRAVO, PA-C PCP-Cardiologist: Jayson Sierras, MD    HPI:  53 yo F with PMH of HFmrEF, frequent PVCs, severe PAD s/p bilateral AKA, tobacco use disorder, hyperlipidemia, anxiety, depression, COPD, hx of PE, type 2 diabetes. History of ECHO with EF 40% in 07/2021, improved to 55% in 07/2022. Last ECHO on 04/2023 with EF 45-50%, elevated LVEDP, normal RV function, small pericardial effusion, mild MR, mild aortic valve regurgitation. Has followed with EP as outpatient for PVCs, PACs, brief episodes of NSVT on long-term monitor. Felt not to be a candidate for ablation and her metoprolol  was transitioned to propranolol .   Patient presented to [facility] ED on 1/24 with worsening SOB, abdominal distention, and LEE. Chest x-ray showed bilateral pleural effusions, BNP>7000. She was transferred to [facility] on 1/25. Once at [facility] BNP >4500. Patient somnolent with worsening wheezing on exam, noted she was given multiple doses of morphine  in [place]. Narcan  administered at [facility]. Also noted to be hypoglycemic, started on dextrose . Found to have acute respiratory and metabolic acidosis and was started on BiPAP. Initiated diuresis with IV lasix , home GDMT held due to hypotension and acute respiratory and metabolic acidosis. New ECHO 1/27 with LVEF 30-35%, no RWMA, RV moderately reduced, moderate to severe concentric MR.   Patient reports that Jardiance  and Entresto  were stopped during last hospitalization at [facility], although both of these were filled on 08/04/23. Per care everywhere, it does look like Entresto  was temporarily stopped due to acute renal failure during recent hospitalization 06/2023 at [facility]. Patient also with hypoglycemia during last hospitalization.   Reports her breathing is better. Still with edema on her thighs and abdomen. Feels about the same as yesterday after getting transitioned to PO lasix . Has been on and off supplemental  oxygen - unsure if she will need this going home. No new labs yet today.    Current HF Medications: Diuretic: furosemide  80 mg PO daily Beta Blocker: metoprolol  XL 25 mg daily SGLT2i: Jardiance  10 mg daily  Prior to admission HF Medications: Beta blocker: propranolol  ER 160 mg daily ACE/ARB/ARNI: Entresto  24/26 mg BID SGLT2i: Jardiance  25 mg daily  Pertinent Lab Values: As of 2/6: Serum creatinine 2.35, BUN 45, Potassium 3.8, Sodium 130, BNP >4500, Magnesium  2.1, A1c 7.6  Vital Signs: Weight: 135 lbs (admission weight: 164 lbs) - bed weights Blood pressure: 120/50-70s  Heart rate: 70s I/O: net -0.8L yesterday; net -10.5L since admission  Medication Assistance / Insurance Benefits Check: Does the patient have prescription insurance?  Yes Type of insurance plan: Medicare + Medicaid  Outpatient Pharmacy:  Prior to admission outpatient pharmacy: [facility] Is the patient willing to use [facility] TOC pharmacy at discharge? Yes Is the patient willing to transition their outpatient pharmacy to utilize a [facility] outpatient pharmacy?   No    Assessment:  1. Acute on chronic HFmrEF (LVEF 30-35%), due to unclear etiology. NYHA class II symptoms. - Continue furosemide  80 mg PO daily. No new labs today.  - Continue metoprolol  XL 25 mg daily  - Continue Jardiance  10 mg daily - Continue to hold off on Entresto  and spironolactone with AKI - Daily weights, strict I/Os, keep K>4 and Mg>2.    Plan: 1) Medication changes recommended at this time: - Continue furosemide  80 mg daily, may need to write for BID as needed at discharge  2) Patient assistance: - Patient has Medicare + Medicaid - Entresto /Jardiance  $0  3)  Education  -  Patient has been educated on current HF medications and potential additions to HF medication regimen - Patient verbalizes understanding that over the next few months, these medication doses may change and more medications may be added to optimize HF regimen - Patient  has been educated on basic disease state pathophysiology and goals of therapy   Duwaine Plant, PharmD, BCPS Heart Failure Stewardship Pharmacist Phone [phone]

## 2023-09-03 NOTE — Progress Notes (Signed)
 Physical Therapy Treatment Patient Details Name: [name] MRN: [id number] DOB: [birth year] Today's Date: 09/03/2023   History of Present Illness The pt is a 53 yo female presenting 1/25 from outside hospital with SOB and edema. Admitted for management of CHF exacerbation. Hospital course complicated by hypoglycemia, acute respiratory and metabolic acidosis, subsequently started on BiPAP 1/27. PMH includes: anemia, anxiety, depression, CKD III, COPD, DM II, HTN, PAD s/p bilateral AKA, PE on Xarelto, PVC.    PT Comments  Assisted RN with transfers OOB to w/c then w/c to car seat for discharge home. Pt demonstrated improved sitting balance, strength, and sequencing today. She was able to transfer via anterior scoot OOB to w/c 1x then transfer via lateral scoot w/c to car seat using sliding board 1x, both with minA to scoot buttocks.     If plan is discharge home, recommend the following: Two people to help with walking and/or transfers;Two people to help with bathing/dressing/bathroom;Direct supervision/assist for medications management;Direct supervision/assist for financial management;Assist for transportation;Help with stairs or ramp for entrance   Can travel by private vehicle     No  Equipment Recommendations  Other (comment);Hoyer lift;Hospital bed;BSC/3in1 (drop-arm [facility])    Recommendations for Other Services       Precautions / Restrictions Precautions Precautions: Fall Precaution Comments: bilateral AKA Restrictions Weight Bearing Restrictions Per Provider Order: No     Mobility  Bed Mobility               General bed mobility comments: Pt sitting EOB upon arrival    Transfers Overall transfer level: Needs assistance Equipment used: None, Sliding board Transfers: Bed to chair/wheelchair/BSC         Anterior-Posterior transfers: Min assist, +2 safety/equipment, From elevated surface  Lateral/Scoot Transfers: Min assist, +2 safety/equipment General transfer  comment: Pt able to anteriorly scoot into w/c from elevated EOB with w/c flush with EOB. Pt needed extra time and cues to walk legs anteriorly onto w/c, minA to scoot buttocks. Pt able to use sliding board to transfer laterally to L from w/c to car seat for d/c home, needing minA to scoot buttocks along board.    Ambulation/Gait               General Gait Details: unable   Stairs             Wheelchair Mobility     Tilt Bed    Modified Rankin (Stroke Patients Only)       Balance Overall balance assessment: Needs assistance Sitting-balance support: Bilateral upper extremity supported, No upper extremity supported Sitting balance-Leahy Scale: Good Sitting balance - Comments: Able to sit EOB without LOB       Standing balance comment: unable                            Cognition Arousal: Alert Behavior During Therapy: WFL for tasks assessed/performed Overall Cognitive Status: No family/caregiver present to determine baseline cognitive functioning Area of Impairment: Following commands, Problem solving                       Following Commands: Follows multi-step commands with increased time, Follows one step commands consistently     Problem Solving: Slow processing, Requires verbal cues General Comments: Needs cues for problem-solving transfers intermittently        Exercises      General Comments        Pertinent Vitals/Pain Pain  Assessment Pain Assessment: Faces Faces Pain Scale: No hurt Pain Intervention(s): Monitored during session    Home Living                          Prior Function            PT Goals (current goals can now be found in the care plan section) Acute Rehab PT Goals Patient Stated Goal: to return home, not need SNF PT Goal Formulation: With patient Time For Goal Achievement: 09/13/23 Potential to Achieve Goals: Fair Progress towards PT goals: Progressing toward goals     Frequency    Min 1X/week      PT Plan      Co-evaluation              AM-PAC PT 6 Clicks Mobility   Outcome Measure  Help needed turning from your back to your side while in a flat bed without using bedrails?: None Help needed moving from lying on your back to sitting on the side of a flat bed without using bedrails?: A Little Help needed moving to and from a bed to a chair (including a wheelchair)?: A Little Help needed standing up from a chair using your arms (e.g., wheelchair or bedside chair)?: Total Help needed to walk in hospital room?: Total Help needed climbing 3-5 steps with a railing? : Total 6 Click Score: 13    End of Session   Activity Tolerance: Patient tolerated treatment well Patient left: with family/visitor present;Other (comment) (in car discharging hospital; RN assisted PT with transfer to car) Nurse Communication: Mobility status PT Visit Diagnosis: Other abnormalities of gait and mobility (R26.89);Muscle weakness (generalized) (M62.81)     Time: 8690-8679 PT Time Calculation (min) (ACUTE ONLY): 11 min  Charges:    $Therapeutic Activity: 8-22 mins PT General Charges $$ ACUTE PT VISIT: 1 Visit                     Theo Ferretti, PT, DPT Acute Rehabilitation Services  Office: [phone]    Theo CHRISTELLA Ferretti 09/03/2023, 1:36 PM

## 2023-09-03 NOTE — TOC Transition Note (Signed)
 Transition of Care (TOC) - Discharge Note Patient planned for transition home to single family home. Patient has [facility] for RN, PT, & OT and received resumption orders. No further home needs identified. Transportation home by son [name] via private vehicle.    Patient Details  Name: [name] MRN: [id number] Date of Birth: 03-28-1971  Transition of Care [facility]) CM/SW Contact:  Doneta Glenys DASEN, RN Phone Number: 09/03/2023, 12:18 PM   Clinical Narrative:       Final next level of care: Home w Home Health Services [facility]) Barriers to Discharge: No Barriers Identified   Patient Goals and CMS Choice Patient states their goals for this hospitalization and ongoing recovery are:: Patient will return home once stable.   Choice offered to / list presented to :  (currently active with [facility])      Discharge Placement                       Discharge Plan and Services Additional resources added to the After Visit Summary for   In-house Referral: NA Discharge Planning Services: CM Consult Post Acute Care Choice: Home Health                    HH Arranged: RN, PT, OT [facility] Agency: Other - See comment [facility]) Date [facility] Agency Contacted: 09/02/23 Time HH Agency Contacted: 1545 Representative spoke with at [facility] Agency: April  Social Drivers of Health (SDOH) Interventions SDOH Screenings   Food Insecurity: No Food Insecurity (08/23/2023)  Housing: Low Risk  (08/23/2023)  Transportation Needs: No Transportation Needs (08/23/2023)  Utilities: Not At Risk (08/23/2023)  Alcohol  Screen: Low Risk  (03/18/2022)  Depression (PHQ2-9): Medium Risk (03/18/2022)  Financial Resource Strain: Low Risk  (07/23/2023)   Received from [facility]  Physical Activity: Inactive (07/23/2023)   Received from [facility]  Social Connections: Socially Isolated (07/23/2023)   Received from [facility]  Stress: No Stress  Concern Present (07/23/2023)   Received from [facility]  Tobacco Use: High Risk (08/24/2023)  Health Literacy: Inadequate Health Literacy (08/04/2023)   Received from [facility]     Readmission Risk Interventions     No data to display

## 2023-09-03 NOTE — Discharge Summary (Signed)
 Physician Discharge Summary   Patient: [name] MRN: [id number] DOB: May 11, 1971  Admit date:     08/21/2023  Discharge date: 09/03/23  Discharge Physician: Bracy Pepper    PCP: Jeanette Comer BRAVO, PA-C    Discharge Diagnoses: Principal Problem:   Acute on chronic systolic CHF (congestive heart failure) (HCC) Active Problems:   CKD (chronic kidney disease) stage 3, GFR 30-59 ml/min (HCC)   Hypertension   Type 2 diabetes mellitus with hyperlipidemia (HCC)   COPD (chronic obstructive pulmonary disease) (HCC)   Obesity (BMI 30-39.9)  Resolved Problems:   * No resolved hospital problems. *  Hospital Course:  53 year old female with CHF, CAD, diabetes mellitus type 2, HTN, bilateral BKA and CKD stage 3 presented with progressive shortness of breath, abdominal distention, lower extremity edema.   She was evaluated at [facility] ED, she was diagnosed with volume overload and was transferred to [facility] for further treatment and diagnosis. At the time of her transfer she was somnolent, blood pressure was 94/61, HR 80, RR 12 and 02 saturation 93% on 3 L/min per [place].  BNP > 4500, creatinine 1.7. Chest x-ray showed cardiomegaly with bilateral hilar vascular congestion, cephalization of the vasculature, bilateral pleural effusions.  Cardiology was consulted. 2D echo showed EF 30 to 35%, RV systolic function with moderate reduction, LA with severe dilation, RA with moderate dilation, moderate to severe MR, aortic valve stenosis, trivial pericardial effusion. Pt received IV lasix  80 mg bid and she had a negative balance of 9.6L. Her weight decreased from 164 lb on admission to 145 lb. Pt was ready for discharge on 09/03/2023.  DISCHARGE MEDICATION: Allergies as of 09/03/2023   No Known Allergies      Medication List     STOP taking these medications    amLODipine  2.5 MG tablet Commonly known as: NORVASC    Entresto  24-26 MG Generic drug: sacubitril -valsartan    escitalopram 20 MG  tablet Commonly known as: LEXAPRO   insulin  glargine 100 unit/mL Sopn Commonly known as: LANTUS    methocarbamol  500 MG tablet Commonly known as: ROBAXIN    Ozempic (0.25 or 0.5 MG/DOSE) 2 MG/3ML Sopn Generic drug: Semaglutide(0.25 or 0.5MG /DOS)   propranolol  ER 120 MG 24 hr capsule Commonly known as: INDERAL  LA   propranolol  ER 160 MG SR capsule Commonly known as: Inderal  LA   topiramate  50 MG tablet Commonly known as: TOPAMAX        TAKE these medications    ALPRAZolam  0.5 MG tablet Commonly known as: XANAX  Take 1 tablet (0.5 mg total) by mouth 3 (three) times daily as needed for anxiety.   aspirin  EC 81 MG tablet Take 81 mg by mouth daily. Swallow whole.   clopidogrel  75 MG tablet Commonly known as: PLAVIX  Take 75 mg by mouth daily.   cyanocobalamin  1000 MCG tablet Take 1 tablet (1,000 mcg total) by mouth daily.   docusate sodium  100 MG capsule Commonly known as: COLACE Take 100 mg by mouth as needed.   DULoxetine  30 MG capsule Commonly known as: CYMBALTA  Take 30 mg by mouth daily.   empagliflozin  10 MG Tabs tablet Commonly known as: JARDIANCE  Take 1 tablet (10 mg total) by mouth daily. Start taking on: September 04, 2023 What changed:  medication strength how much to take   furosemide  80 MG tablet Commonly known as: LASIX  Take 1 tablet (80 mg total) by mouth daily. Start taking on: September 04, 2023   gabapentin  100 MG capsule Commonly known as: NEURONTIN  Take 1 capsule (100 mg total)  by mouth 3 (three) times daily. What changed:  medication strength how much to take Another medication with the same name was removed. Continue taking this medication, and follow the directions you see here.   linaclotide  72 MCG capsule Commonly known as: LINZESS  Take 72 mcg by mouth as needed.   metoprolol  succinate 25 MG 24 hr tablet Commonly known as: TOPROL -XL Take 1 tablet (25 mg total) by mouth daily. Start taking on: September 04, 2023   mexiletine 200 MG  capsule Commonly known as: MEXITIL  Take 1 capsule (200 mg total) by mouth every 12 (twelve) hours.   ondansetron  4 MG tablet Commonly known as: ZOFRAN  Take 4 mg by mouth every 6 (six) hours as needed for nausea or vomiting.   pantoprazole  40 MG tablet Commonly known as: PROTONIX  Take 1 tablet (40 mg total) by mouth daily.   polyethylene glycol powder 17 GM/SCOOP powder Commonly known as: GLYCOLAX /MIRALAX  Take one capful in 6 ounces of liquid twice daily until soft stool, then continue once daily.   rosuvastatin  40 MG tablet Commonly known as: CRESTOR  Take 40 mg by mouth daily.   traMADol  50 MG tablet Commonly known as: ULTRAM  Take 50 mg by mouth daily.   Ventolin  HFA 108 (90 Base) MCG/ACT inhaler Generic drug: albuterol  SMARTSIG:2 Puff(s) By Mouth PRN   albuterol  (2.5 MG/3ML) 0.083% nebulizer solution Commonly known as: PROVENTIL  Take 2.5 mg by nebulization every 4 (four) hours as needed for wheezing or shortness of breath.        Follow-up Information     [facility] Follow up.   Why: PT and RN Contact information: [address] [place], [place] [address] Phone Number [phone]               Discharge Exam: [facility] Weights   09/01/23 0304 09/02/23 0354 09/03/23 0518  Weight: 65.3 kg 66.1 kg 61.5 kg   Physical Exam HENT:     Head: Normocephalic and atraumatic.     Mouth/Throat:     Mouth: Mucous membranes are moist.  Cardiovascular:     Rate and Rhythm: Normal rate and regular rhythm.  Pulmonary:     Effort: Pulmonary effort is normal.  Abdominal:     Palpations: Abdomen is soft.  Skin:    General: Skin is warm.  Neurological:     Mental Status: She is alert. Mental status is at baseline.  Psychiatric:        Mood and Affect: Mood normal.      Condition at discharge: fair  The results of significant diagnostics from this hospitalization (including imaging, microbiology, ancillary and laboratory) are listed  below for reference.   Imaging Studies: CT ABDOMEN PELVIS WO CONTRAST Result Date: 09/01/2023 CLINICAL DATA:  Acute nonlocalized abdominal pain EXAM: CT ABDOMEN AND PELVIS WITHOUT CONTRAST TECHNIQUE: Multidetector CT imaging of the abdomen and pelvis was performed following the standard protocol without IV contrast. RADIATION DOSE REDUCTION: This exam was performed according to the departmental dose-optimization program which includes automated exposure control, adjustment of the mA and/or kV according to patient size and/or use of iterative reconstruction technique. COMPARISON:  08/20/2023 FINDINGS: Lower chest: Small right pleural effusion is again seen, however, there is developing right basilar consolidation in keeping with developing lobar pneumonia or aspiration. Extensive multi-vessel coronary artery calcification. Global cardiac size within normal limits. Hypoattenuation of the cardiac blood pool noted in keeping with at least mild anemia. Hepatobiliary: No focal liver abnormality is seen. Status post cholecystectomy.  No biliary dilatation. Pancreas: Unremarkable Spleen: Unremarkable Adrenals/Urinary Tract: Adrenal glands are unremarkable. Moderate asymmetric right renal cortical atrophy. Left kidney is normal in size and position. No hydronephrosis. No intrarenal or ureteral calculi. 11 mm macroscopic fat containing exophytic lesion arises from the upper pole the right kidney compatible with a small renal angiomyolipoma, unchanged prior examination. The bladder is unremarkable. Stomach/Bowel: Mild ascites. Stomach, small bowel, and large bowel are unremarkable. No evidence of obstruction or focal inflammation. Status post appendectomy. No free intraperitoneal gas. Vascular/Lymphatic: Extensive aortoiliac atherosclerotic calcification. No aortic aneurysm. No pathologic adenopathy within the abdomen and pelvis. Right femoral bypass grafting is partially visualized. Reproductive: Uterus and bilateral adnexa  are unremarkable. Other: Moderate subcutaneous body wall edema. No abdominal wall hernia. Musculoskeletal: No acute bone abnormality. No lytic or blastic bone lesion. IMPRESSION: 1. No acute intra-abdominal pathology identified. 2. Small right pleural effusion with developing right basilar consolidation in keeping with developing lobar pneumonia or aspiration. 3. Extensive multi-vessel coronary artery calcification. 4. Moderate asymmetric right renal cortical atrophy. 5. Mild ascites. Moderate subcutaneous body wall edema. Imaging findings are in keeping with anasarca. 6. 11 mm right renal angiomyolipoma, unchanged from prior examination. Aortic Atherosclerosis (ICD10-I70.0). Electronically Signed   By: Dorethia Molt M.D.   On: 09/01/2023 22:52   US  RENAL Result Date: 08/24/2023 CLINICAL DATA:  Acute kidney injury EXAM: RENAL / URINARY TRACT ULTRASOUND COMPLETE COMPARISON:  None Available. FINDINGS: Right Kidney: Renal measurements: 7.5 x 3.4 x 2.7 cm = volume: 36.5 mL. Echogenicity within normal limits. No mass or hydronephrosis visualized. Left Kidney: Renal measurements: 11.1 x 4.8 x 3.8 cm = volume: 106 mL. Echogenicity within normal limits. No mass or hydronephrosis visualized. Bladder: Under distended and not well evaluated. Other: There is a small amount of free fluid in the right upper quadrant. IMPRESSION: 1. No hydronephrosis. 2. Small amount of free fluid in the right upper quadrant. Electronically Signed   By: Greig Pique M.D.   On: 08/24/2023 20:58   ECHOCARDIOGRAM COMPLETE Result Date: 08/23/2023    ECHOCARDIOGRAM REPORT   Patient Name:   [name] Date of Exam: 08/23/2023 Medical Rec #:  [id number]       Height:       66.0 in Accession #:    [id number]      Weight:       173.9 lb Date of Birth:  02-Feb-1971       BSA:          1.885 m Patient Age:    52 years        BP:           116/65 mmHg Patient Gender: F               HR:           36 bpm. Exam Location:  Inpatient Procedure: 2D Echo,  Color Doppler and Cardiac Doppler Indications:    CHF I50.21  History:        Patient has prior history of Echocardiogram examinations, most                 recent 05/20/2023. CHF, CKD, COPD and PAD; Risk                 Factors:Diabetes, Hypertension and Current Smoker.  Sonographer:    Tillman Nora RVT RCS Referring Phys: [id number] ROLLO JONELLE LOUDER IMPRESSIONS  1. Left ventricular ejection fraction, by estimation, is 30 to 35%. The left ventricle has moderately decreased  function. The left ventricle has no regional wall motion abnormalities. Left ventricular diastolic function could not be evaluated.  2. Right ventricular systolic function is moderately reduced. The right ventricular size is normal.  3. Left atrial size was severely dilated.  4. Right atrial size was moderately dilated.  5. The posterior leaflet appears restrcited. There is moderate to severe eccentric MR. The mitral valve is normal in structure. Moderate to severe mitral valve regurgitation. No evidence of mitral stenosis.  6. The aortic valve is tricuspid. There is moderate calcification of the aortic valve. Aortic valve regurgitation is trivial. Mild aortic valve stenosis.  7. The inferior vena cava is dilated in size with >50% respiratory variability, suggesting right atrial pressure of 8 mmHg. FINDINGS  Left Ventricle: Left ventricular ejection fraction, by estimation, is 30 to 35%. The left ventricle has moderately decreased function. The left ventricle has no regional wall motion abnormalities. The left ventricular internal cavity size was normal in size. There is no left ventricular hypertrophy. Left ventricular diastolic function could not be evaluated due to atrial fibrillation. Left ventricular diastolic function could not be evaluated. Right Ventricle: The right ventricular size is normal. No increase in right ventricular wall thickness. Right ventricular systolic function is moderately reduced. Left Atrium: Left atrial size was  severely dilated. Right Atrium: Right atrial size was moderately dilated. Pericardium: Trivial pericardial effusion is present. The pericardial effusion is localized near the right atrium. Mitral Valve: The posterior leaflet appears restrcited. There is moderate to severe eccentric MR. The mitral valve is normal in structure. Moderate to severe mitral valve regurgitation. No evidence of mitral valve stenosis. Tricuspid Valve: The tricuspid valve is normal in structure. Tricuspid valve regurgitation is mild . No evidence of tricuspid stenosis. Aortic Valve: The aortic valve is tricuspid. There is moderate calcification of the aortic valve. Aortic valve regurgitation is trivial. Mild aortic stenosis is present. Aortic valve mean gradient measures 4.3 mmHg. Aortic valve peak gradient measures 8.8 mmHg. Aortic valve area, by VTI measures 1.64 cm. Pulmonic Valve: The pulmonic valve was normal in structure. Pulmonic valve regurgitation is trivial. No evidence of pulmonic stenosis. Aorta: The aortic root is normal in size and structure. Venous: The inferior vena cava is dilated in size with greater than 50% respiratory variability, suggesting right atrial pressure of 8 mmHg. IAS/Shunts: No atrial level shunt detected by color flow Doppler.  LEFT VENTRICLE PLAX 2D LVIDd:         4.40 cm LVIDs:         3.40 cm LV PW:         1.20 cm LV IVS:        0.90 cm LVOT diam:     1.80 cm LV SV:         39 LV SV Index:   21 LVOT Area:     2.54 cm  RIGHT VENTRICLE            IVC RV Basal diam:  3.90 cm    IVC diam: 1.90 cm RV S prime:     7.65 cm/s TAPSE (M-mode): 2.4 cm LEFT ATRIUM              Index        RIGHT ATRIUM           Index LA diam:        4.10 cm  2.18 cm/m   RA Area:     22.00 cm LA Vol (A2C):   82.7 ml  43.88  ml/m  RA Volume:   68.00 ml  36.08 ml/m LA Vol (A4C):   101.0 ml 53.59 ml/m LA Biplane Vol: 90.5 ml  48.02 ml/m  AORTIC VALVE                    PULMONIC VALVE AV Area (Vmax):    1.48 cm     PV Vmax:        0.81 m/s AV Area (Vmean):   1.40 cm     PV Peak grad:  2.6 mmHg AV Area (VTI):     1.64 cm AV Vmax:           148.67 cm/s AV Vmean:          91.100 cm/s AV VTI:            0.238 m AV Peak Grad:      8.8 mmHg AV Mean Grad:      4.3 mmHg LVOT Vmax:         86.20 cm/s LVOT Vmean:        50.167 cm/s LVOT VTI:          0.153 m LVOT/AV VTI ratio: 0.64 AR Vena Contracta: 0.70 cm  AORTA Ao Root diam: 2.40 cm Ao Asc diam:  2.70 cm MITRAL VALVE                TRICUSPID VALVE MV Area (PHT): 3.72 cm     TV Peak grad:   28.5 mmHg MV Decel Time: 204 msec     TV Vmax:        2.67 m/s MR Peak grad: 57.2 mmHg     TR Peak grad:   25.2 mmHg MR Mean grad: 42.0 mmHg     TR Vmax:        251.00 cm/s MR Vmax:      378.00 cm/s MR Vmean:     307.0 cm/s    SHUNTS MV E velocity: 141.00 cm/s  Systemic VTI:  0.15 m MV A velocity: 58.30 cm/s   Systemic Diam: 1.80 cm MV E/A ratio:  2.42 Toribio Fuel MD Electronically signed by Toribio Fuel MD Signature Date/Time: 08/23/2023/5:24:33 PM    Final    DG Chest Port 1 View Result Date: 08/22/2023 CLINICAL DATA:  Wheezing. EXAM: PORTABLE CHEST 1 VIEW COMPARISON:  08/20/2023 FINDINGS: The cardio pericardial silhouette is enlarged. Bibasilar atelectasis/infiltrate with small to moderate bilateral pleural effusions, similar to prior. Telemetry leads overlie the chest. IMPRESSION: Bibasilar atelectasis/infiltrate with small to moderate bilateral pleural effusions, similar to prior. Electronically Signed   By: Camellia Candle M.D.   On: 08/22/2023 08:01    Microbiology: Results for orders placed or performed during the hospital encounter of 08/21/23  Urine Culture     Status: Abnormal   Collection Time: 08/23/23 12:05 AM   Specimen: Urine, Random  Result Value Ref Range Status   Specimen Description URINE, RANDOM  Final   Special Requests   Final    NONE Reflexed from [id number] Performed at [facility], [address] [place], [name] [address]    Culture 60,000 COLONIES/mL  STAPHYLOCOCCUS AUREUS (A)  Final   Report Status 08/25/2023 FINAL  Final   Organism ID, Bacteria STAPHYLOCOCCUS AUREUS (A)  Final      Susceptibility   Staphylococcus aureus - MIC*    CIPROFLOXACIN <=0.5 SENSITIVE Sensitive     GENTAMICIN <=0.5 SENSITIVE Sensitive     NITROFURANTOIN 32 SENSITIVE Sensitive     OXACILLIN 0.5  SENSITIVE Sensitive     TETRACYCLINE <=1 SENSITIVE Sensitive     VANCOMYCIN  1 SENSITIVE Sensitive     TRIMETH/SULFA <=10 SENSITIVE Sensitive     RIFAMPIN <=0.5 SENSITIVE Sensitive     Inducible Clindamycin POSITIVE Resistant     LINEZOLID 2 SENSITIVE Sensitive     * 60,000 COLONIES/mL STAPHYLOCOCCUS AUREUS    Labs: CBC: Recent Labs  Lab 08/28/23 0344 08/29/23 0427 08/30/23 0414 08/31/23 0454  WBC 6.6 6.8 6.4 6.5  HGB 9.4* 9.5* 9.5* 9.6*  HCT 28.7* 29.9* 29.5* 29.8*  MCV 91.7 92.3 91.6 90.9  PLT 179 181 184 213   Basic Metabolic Panel: Recent Labs  Lab 08/29/23 0427 08/30/23 0414 08/31/23 0454 09/01/23 0442 09/02/23 0433  NA 131* 131* 133* 134* 130*  K 3.7 3.5 3.6 4.2 3.8  CL 92* 91* 89* 88* 84*  CO2 28 29 31  32 31  GLUCOSE 160* 116* 144* 102* 114*  BUN 43* 43* 46* 45* 45*  CREATININE 2.09* 2.17* 2.18* 2.34* 2.35*  CALCIUM  8.4* 8.4* 8.6* 9.3 8.8*   Liver Function Tests: Recent Labs  Lab 08/30/23 0414  AST 21  ALT 9  ALKPHOS 92  BILITOT 0.7  PROT 5.3*  ALBUMIN 2.4*   CBG: Recent Labs  Lab 09/02/23 0736 09/02/23 1144 09/02/23 1615 09/02/23 2123 09/03/23 0627  GLUCAP 133* 213* 184* 178* 122*    Discharge time spent: greater than 30 minutes.  Signed: Eliya Bubar , MD [facility] 09/03/2023

## 2023-09-13 NOTE — Progress Notes (Unsigned)
 [facility] [place]  ASSESSMENT / PLAN: 53 y.o. female with known left iliac artery occlusion, present since at least 09/05/21 on angiogram.   CHIEF COMPLAINT: ***  HISTORY OF PRESENT ILLNESS: [name] is a 53 y.o. female ***  VASCULAR SURGICAL HISTORY: ***  VASCULAR RISK FACTORS: {FINDINGS; POSITIVE NEGATIVE:231-392-4250} history of stroke / transient ischemic attack. {FINDINGS; POSITIVE NEGATIVE:231-392-4250} history of coronary artery disease. *** history of PCI. *** history of CABG.  {FINDINGS; POSITIVE NEGATIVE:231-392-4250} history of diabetes mellitus. Last A1c ***. {FINDINGS; POSITIVE NEGATIVE:231-392-4250} history of smoking. *** actively smoking. {FINDINGS; POSITIVE NEGATIVE:231-392-4250} history of hypertension. *** drug regimen with *** control. {FINDINGS; POSITIVE NEGATIVE:231-392-4250} history of chronic kidney disease.  Last GFR ***. CKD {stage:30421363}. {FINDINGS; POSITIVE NEGATIVE:231-392-4250} history of chronic obstructive pulmonary disease, treated with ***.  FUNCTIONAL STATUS: ECOG performance status: {findings; ecog performance status:31780} Ambulatory status: {TNHAmbulation:25868}  [name] (2pts) 75-79 or 80-84 (2pts) >84 (3pts) Dependence in toileting (1pt) Partial or full dependence in dressing (1pt) History of malignant neoplasm (2pts) CHF (3pts) COPD (1pts) CKD (3pts)  0-3 pts 6% 1 year mortality ; 21% 3 year mortality 4-5 pts 12% 1 year mortality ; 36% 3 year mortality >5 pts 21% 1 year mortality; 54% 3 year mortality   Past Medical History:  Diagnosis Date   Anemia 02/05/2012   Anxiety    Arthritis    Asthma    B12 deficiency    CKD (chronic kidney disease) stage 3, GFR 30-59 ml/min (HCC) 09/25/2021   COPD (chronic obstructive pulmonary disease) (HCC)    Depression    Diabetes (HCC)    Elevated LFTs    Essential hypertension    Fatty liver    Folate deficiency    GERD (gastroesophageal reflux  disease)    Headache(784.0)    Heart failure (HCC)    PAD (peripheral artery disease) (HCC)    Status post bilateral AKA   PE (pulmonary embolism)    Xarelto   Pneumonia 2013   PVC (premature ventricular contraction)    Sepsis (HCC)    Type 2 diabetes mellitus (HCC)    Vitamin D deficiency     Past Surgical History:  Procedure Laterality Date   ABDOMINAL AORTOGRAM W/LOWER EXTREMITY N/A 09/05/2021   Procedure: ABDOMINAL AORTOGRAM W/LOWER EXTREMITY;  Surgeon: [name], MD;  Location: [facility];  Service: Cardiovascular;  Laterality: N/A;   AMPUTATION Right 09/24/2021   Procedure: RIGHT ABOVE KNEE AMPUTATION;  Surgeon: [name], MD;  Location: [facility] OR;  Service: Vascular;  Laterality: Right;   APPENDECTOMY  2013   CHOLECYSTECTOMY     DIAGNOSTIC LAPAROSCOPY     ESOPHAGEAL DILATION N/A 10/09/2014   Procedure: ESOPHAGEAL DILATION;  Surgeon: West Bali, MD;  Location: [facility] ORS;  Service: Endoscopy;  Laterality: N/A;  #15 and #16 savory   ESOPHAGOGASTRODUODENOSCOPY (EGD) WITH PROPOFOL N/A 10/09/2014   Dr. Darrick Penna: mild erosive gastritis, Savary dilation. Nausea and vomiting most likely due to uncontroleld blood sugars, reflux, and gastritis.     Family History  Problem Relation Age of Onset   Breast cancer Mother    Kidney disease Sister    Diabetes Sister    Breast cancer Sister    Diabetes Brother    Colon cancer Neg Hx    Liver disease Neg Hx     Social History   Socioeconomic History   Marital status: Single    Spouse name: Not on file   Number  of children: Not on file   Years of education: 12   Highest education level: 12th grade  Occupational History   Not on file  Tobacco Use   Smoking status: Every Day    Current packs/day: 1.00    Average packs/day: 1 pack/day for 20.0 years (20.0 ttl pk-yrs)    Types: Cigarettes    Passive exposure: Current   Smokeless tobacco: Never  Vaping Use   Vaping status: Former  Substance and Sexual Activity    Alcohol use: No   Drug use: No   Sexual activity: Yes    Birth control/protection: Injection  Other Topics Concern   Not on file  Social History Narrative   Not on file   Social Drivers of Health   Financial Resource Strain: Low Risk  (07/23/2023)   Received from [facility]   Overall Financial Resource Strain (CARDIA)    Difficulty of Paying Living Expenses: Not very hard  Food Insecurity: No Food Insecurity (08/23/2023)   Hunger Vital Sign    Worried About Running Out of Food in the Last Year: Never true    Ran Out of Food in the Last Year: Never true  Transportation Needs: No Transportation Needs (09/08/2023)   Received from [facility]   OASIS A1250: Transportation    Lack of Transportation (Medical): No    Lack of Transportation (Non-Medical): No    Patient Unable or Declines to Respond: No  Physical Activity: Inactive (07/23/2023)   Received from [facility]   Exercise Vital Sign    Days of Exercise per Week: 0 days    Minutes of Exercise per Session: 0 min  Stress: No Stress Concern Present (07/23/2023)   Received from [facility] - Occupational Stress Questionnaire    Feeling of Stress : Not at all  Social Connections: Socially Isolated (07/23/2023)   Received from [facility]   Social Connection and Isolation Panel [NHANES]    Frequency of Communication with Friends and Family: Three times a week    Frequency of Social Gatherings with Friends and Family: Three times a week    Attends Religious Services: Never    Active Member of Clubs or Organizations: No    Attends Banker Meetings: Never    Marital Status: Separated  Intimate Partner Violence: Not At Risk (08/23/2023)   Humiliation, Afraid, Rape, and Kick questionnaire    Fear of Current or Ex-Partner: No    Emotionally Abused: No    Physically Abused: No    Sexually Abused: No    No Known Allergies  Current Outpatient  Medications  Medication Sig Dispense Refill   albuterol (PROVENTIL) (2.5 MG/3ML) 0.083% nebulizer solution Take 2.5 mg by nebulization every 4 (four) hours as needed for wheezing or shortness of breath.     ALPRAZolam (XANAX) 0.5 MG tablet Take 1 tablet (0.5 mg total) by mouth 3 (three) times daily as needed for anxiety. 10 tablet 0   aspirin EC 81 MG tablet Take 81 mg by mouth daily. Swallow whole.     clopidogrel (PLAVIX) 75 MG tablet Take 75 mg by mouth daily.     docusate sodium (COLACE) 100 MG capsule Take 100 mg by mouth as needed.     DULoxetine (CYMBALTA) 30 MG capsule Take 30 mg by mouth daily.     empagliflozin (JARDIANCE) 10 MG TABS tablet Take 1 tablet (10 mg total) by mouth daily. 30 tablet 0  furosemide (LASIX) 80 MG tablet Take 1 tablet (80 mg total) by mouth daily. 30 tablet 0   gabapentin (NEURONTIN) 100 MG capsule Take 1 capsule (100 mg total) by mouth 3 (three) times daily. 90 capsule 0   linaclotide (LINZESS) 72 MCG capsule Take 72 mcg by mouth as needed.     metoprolol succinate (TOPROL-XL) 25 MG 24 hr tablet Take 1 tablet (25 mg total) by mouth daily. 30 tablet 0   mexiletine (MEXITIL) 200 MG capsule Take 1 capsule (200 mg total) by mouth every 12 (twelve) hours. 60 capsule 0   ondansetron (ZOFRAN) 4 MG tablet Take 4 mg by mouth every 6 (six) hours as needed for nausea or vomiting.     pantoprazole (PROTONIX) 40 MG tablet Take 1 tablet (40 mg total) by mouth daily.     polyethylene glycol powder (GLYCOLAX/MIRALAX) 17 GM/SCOOP powder Take one capful in 6 ounces of liquid twice daily until soft stool, then continue once daily. 527 g 5   rosuvastatin (CRESTOR) 40 MG tablet Take 40 mg by mouth daily.     traMADol (ULTRAM) 50 MG tablet Take 50 mg by mouth daily.     VENTOLIN HFA 108 (90 Base) MCG/ACT inhaler SMARTSIG:2 Puff(s) By Mouth PRN     vitamin B-12 1000 MCG tablet Take 1 tablet (1,000 mcg total) by mouth daily.     No current facility-administered medications for this  visit.    PHYSICAL EXAM There were no vitals filed for this visit.  Constitutional: *** appearing. *** distress. Appears *** nourished.  Neurologic: CN ***. *** focal findings. *** sensory loss. Psychiatric: *** Mood and affect symmetric and appropriate. Eyes: *** No icterus. No conjunctival pallor. Ears, nose, throat: *** mucous membranes moist. Midline trachea.  Cardiac: *** rate and rhythm.  Respiratory: *** unlabored. Abdominal: *** soft, non-tender, non-distended.  Peripheral vascular: *** Extremity: *** edema. *** cyanosis. *** pallor.  Skin: *** gangrene. *** ulceration.  Lymphatic: *** Stemmer's sign. *** palpable lymphadenopathy.    PERTINENT LABORATORY AND RADIOLOGIC DATA  Most recent CBC    Latest Ref Rng & Units 08/31/2023    4:54 AM 08/30/2023    4:14 AM 08/29/2023    4:27 AM  CBC  WBC 4.0 - 10.5 K/uL 6.5  6.4  6.8   Hemoglobin 12.0 - 15.0 g/dL 9.6  9.5  9.5   Hematocrit 36.0 - 46.0 % 29.8  29.5  29.9   Platelets 150 - 400 K/uL 213  184  181      Most recent CMP    Latest Ref Rng & Units 09/02/2023    4:33 AM 09/01/2023    4:42 AM 08/31/2023    4:54 AM  CMP  Glucose 70 - 99 mg/dL 161  096  045   BUN 6 - 20 mg/dL 45  45  46   Creatinine 0.44 - 1.00 mg/dL 4.09  8.11  9.14   Sodium 135 - 145 mmol/L 130  134  133   Potassium 3.5 - 5.1 mmol/L 3.8  4.2  3.6   Chloride 98 - 111 mmol/L 84  88  89   CO2 22 - 32 mmol/L 31  32  31   Calcium 8.9 - 10.3 mg/dL 8.8  9.3  8.6     Renal function Estimated Creatinine Clearance: 25.6 mL/min (A) (by C-G formula based on SCr of 2.35 mg/dL (H)).  Hgb A1c MFr Bld (%)  Date Value  08/21/2023 7.6 (H)    No results found for: "  LDLCALC", "LDLC", "HIRISKLDL", "POCLDL", "LDLDIRECT", "REALLDLC", "TOTLDLC"   Vascular Imaging: ***  Jasmeet Gehl N. Lenell Antu, MD [facility] Phone Number: [phone] 09/13/2023 3:31 PM   Total time spent on preparing this encounter including chart review,  data review, collecting history, examining the patient, coordinating care for this {tnhtimebilling:26202}  Portions of this report may have been transcribed using voice recognition software.  Every effort has been made to ensure accuracy; however, inadvertent computerized transcription errors may still be present.

## 2023-09-14 ENCOUNTER — Ambulatory Visit (INDEPENDENT_AMBULATORY_CARE_PROVIDER_SITE_OTHER): Payer: 59 | Admitting: Vascular Surgery

## 2023-09-14 ENCOUNTER — Encounter: Payer: Self-pay | Admitting: Vascular Surgery

## 2023-09-14 VITALS — BP 131/74 | HR 96

## 2023-09-14 DIAGNOSIS — Z89612 Acquired absence of left leg above knee: Secondary | ICD-10-CM | POA: Diagnosis not present

## 2023-09-14 DIAGNOSIS — Z89611 Acquired absence of right leg above knee: Secondary | ICD-10-CM

## 2023-09-22 ENCOUNTER — Ambulatory Visit: Payer: 59 | Attending: Nurse Practitioner

## 2023-09-22 DIAGNOSIS — I6523 Occlusion and stenosis of bilateral carotid arteries: Secondary | ICD-10-CM | POA: Diagnosis not present

## 2023-09-28 ENCOUNTER — Telehealth: Payer: Self-pay | Admitting: Cardiology

## 2023-09-28 ENCOUNTER — Other Ambulatory Visit

## 2023-09-28 ENCOUNTER — Other Ambulatory Visit: Payer: Self-pay | Admitting: Nurse Practitioner

## 2023-09-28 DIAGNOSIS — I499 Cardiac arrhythmia, unspecified: Secondary | ICD-10-CM

## 2023-09-28 NOTE — Telephone Encounter (Signed)
 Checking percert on the following   LONG TERM MONITOR-LIVE TELEMETRY (3-14 DAYS)

## 2023-09-28 NOTE — Telephone Encounter (Signed)
 Patient c/o Palpitations:  High priority if patient c/o lightheadedness, shortness of breath, or chest pain  How long have you had palpitations/irregular HR/ Afib? Are you having the symptoms now?  Tresa Endo, nurse with [facility] states she saw patient today, but she's no longer with her. Patient's HR was very irregular--slow then fast, then slow again. She states pulse oximeter waveform was good, but HR was at 31 then went up to 68. Very sporadic.   Are you currently experiencing lightheadedness, SOB or CP?    Do you have a history of afib (atrial fibrillation) or irregular heart rhythm?  Irregular HR/mumur   Have you checked your BP or HR? (document readings if available):  BP: 140's/70's  HR: 31, 68  Are you experiencing any other symptoms?  CP, weakness/tiredness. Tresa Endo mentions that patient has lots of blood drawn with nephrology last week, but she's unsure whether or not that's related to patient's lack of energy.   Tresa Endo mentions patient doesn't have the greatest mobile service and requested the call be returned to her at [phone].

## 2023-09-28 NOTE — Telephone Encounter (Signed)
 Advised HH nurse Tresa Endo of this she visits with patient Thursday or Friday and will help patient place the monitor that day. Will put in order for ZIO AT 2 weeks for irregular HR  Ordered today to be mailed to patient home.  [facility] nurse will go over all instructions with patient regarding E. Peck recommendations.

## 2023-10-04 DIAGNOSIS — I499 Cardiac arrhythmia, unspecified: Secondary | ICD-10-CM | POA: Diagnosis not present

## 2023-10-05 ENCOUNTER — Ambulatory Visit: Payer: 59 | Attending: Nurse Practitioner | Admitting: Nurse Practitioner

## 2023-10-05 ENCOUNTER — Encounter: Payer: Self-pay | Admitting: Nurse Practitioner

## 2023-10-05 VITALS — BP 118/70 | HR 79

## 2023-10-05 DIAGNOSIS — R002 Palpitations: Secondary | ICD-10-CM | POA: Diagnosis not present

## 2023-10-05 DIAGNOSIS — I493 Ventricular premature depolarization: Secondary | ICD-10-CM | POA: Diagnosis not present

## 2023-10-05 DIAGNOSIS — R110 Nausea: Secondary | ICD-10-CM

## 2023-10-05 DIAGNOSIS — E785 Hyperlipidemia, unspecified: Secondary | ICD-10-CM

## 2023-10-05 DIAGNOSIS — N184 Chronic kidney disease, stage 4 (severe): Secondary | ICD-10-CM

## 2023-10-05 DIAGNOSIS — I5020 Unspecified systolic (congestive) heart failure: Secondary | ICD-10-CM

## 2023-10-05 DIAGNOSIS — I10 Essential (primary) hypertension: Secondary | ICD-10-CM | POA: Diagnosis not present

## 2023-10-05 DIAGNOSIS — I6523 Occlusion and stenosis of bilateral carotid arteries: Secondary | ICD-10-CM

## 2023-10-05 DIAGNOSIS — R531 Weakness: Secondary | ICD-10-CM

## 2023-10-05 DIAGNOSIS — Z720 Tobacco use: Secondary | ICD-10-CM

## 2023-10-05 DIAGNOSIS — R0989 Other specified symptoms and signs involving the circulatory and respiratory systems: Secondary | ICD-10-CM

## 2023-10-05 DIAGNOSIS — I739 Peripheral vascular disease, unspecified: Secondary | ICD-10-CM

## 2023-10-05 NOTE — Patient Instructions (Addendum)
 Medication Instructions:  Your physician recommends that you continue on your current medications as directed. Please refer to the Current Medication list given to you today.  Labwork: None   Testing/Procedures: Your physician has requested that you have a carotid duplex. This test is an ultrasound of the carotid arteries in your neck. It looks at blood flow through these arteries that supply the brain with blood. Allow one hour for this exam. There are no restrictions or special instructions.  Follow-Up: Your physician recommends that you schedule a follow-up appointment in: As scheduled   Any Other Special Instructions Will Be Listed Below (If Applicable).  If you need a refill on your cardiac medications before your next appointment, please call your pharmacy.

## 2023-10-05 NOTE — Progress Notes (Unsigned)
 Cardiology Office Note:    Date: 10/05/2023 ID:  [name], DOB 11/30/1970, MRN [id number] PCP:  [name] [facility] Providers Cardiologist:  Sharon Dell, MD Electrophysiologist:  Sharon Small, MD     Referring MD: [name], *   CC: Here for hospital follow-up   History of Present Illness:    [name] is a 53 y.o. female with a PMH of CHF, HTN, HLD, frequent PVC's, palpitations, PAD, s/p bilateral AKA, asthma, T2DM, CKD stage 3, tobacco abuse, and pericardial effusion, who presents today for hospital follow-up.   Follows [facility] for history of early onset of peripheral vascular occlusive disease, history of multiple bilateral procedures in [place]. She is s/p bilateral AKA and continues to use tobacco.   Last seen by Dr. Diona [name] on June 21, 2023.  She reported URI symptoms, headaches, chest congestion, and cough.  Took a course of antibiotics per her PCP.  She noted more palpitations.  Propranolol was increased to 160 mg daily ER.  ED visit on July 18, 2023 for abdominal pain and shortness of breath.  She noticed increased swelling to her abdomen and extremities.  She been having progressive shortness of breath for 2 months.  She was prescribed a short course of Lasix to improve her symptoms.  CT of abdomen was negative for anything acute.  Was instructed to follow-up with her PCP.  Hospitalized later on in December 2024 for acute on chronic congestive CHF.  EF on recent echo was noted to be 45 to 50%.  CTA of abdomen showed bilateral pleural effusions right greater than left.  Received IV Lasix.  GDMT adjusted due to elevation in creatinine during hospitalization.  Troponins 88, 87 87, 98.  ACS was not expected.   Hospitalized from 08/21/2023 to 09/03/2023 at [facility].  Originally evaluated at [facility] ED, diagnosed with volume overload and transferred to [facility] for further treatment and care.  At time of  transfer, patient was found to be somnolent.  BP 94/61.  Rest of vital signs stable.  BNP > 4500, creatinine 1.7.  Cardiology consulted.  2D echo revealed EF 30 to 35%, moderately reduced RV systolic function, severe dilation of LA, moderate dilation of RA, moderate to severe MR, aortic valve stenosis, trivial pericardial effusion.  Received IV Lasix, had good diuresis.  Today she presents for hospital follow-up.  She states she has her good days and not so good days.  Admits to hard sensation/pain along her right side.  Tells me she has had localized swelling along lateral torso for over 10 years per family member's report.  Admits to labile heart rates and recently feeling weak/sick/nauseated. She put on her monitor yesterday and is currently wearing a heart monitor during visit. Denies any chest pain, shortness of breath, syncope, presyncope, dizziness, orthopnea, PND, significant weight changes, acute bleeding, or claudication.  ROS:   Please see the history of present illness.    All other systems reviewed and are negative.  EKGs/Labs/Other Studies Reviewed:    The following studies were reviewed today:   EKG:  EKG Interpretation Date/Time:  Tuesday October 05 2023 14:18:55 EDT Ventricular Rate:  79 PR Interval:  176 QRS Duration:  96 QT Interval:  406 QTC Calculation: 465 R Axis:   107  Text Interpretation: Sinus rhythm with frequent Premature ventricular complexes Rightward axis T wave abnormality, consider inferior ischemia When compared with ECG of 22-Aug-2023 08:53, Incomplete right bundle branch block is no longer  Present Confirmed by [name] [phone]) on 10/05/2023 2:25:39 PM   Carotid duplex 09/2023:  Summary:  Right Carotid: Velocities in the right ICA are consistent with a 1-39%  stenosis. Non-hemodynamically significant plaque <50% noted in the  CCA. The ECA appears >50% stenosed.   Left Carotid: Velocities in the left ICA are consistent with a 40-59%  stenosis.  Non-hemodynamically significant plaque <50% noted in the  CCA. The ECA appears <50% stenosed.   Vertebrals:  Right vertebral artery demonstrates antegrade flow. Left  vertebral artery was not visualized.  Subclavians: Normal flow hemodynamics were seen in bilateral subclavian arteries.   *See table(s) above for measurements and observations.  Suggest follow up study in 1 year. Compared to 09/08/2022 , there is mild  regression of stenosis severity of the left ICA stenosis.  Echo 07/2023:    1. Left ventricular ejection fraction, by estimation, is 30 to 35%. The  left ventricle has moderately decreased function. The left ventricle has  no regional wall motion abnormalities. Left ventricular diastolic function  could not be evaluated.   2. Right ventricular systolic function is moderately reduced. The right  ventricular size is normal.   3. Left atrial size was severely dilated.   4. Right atrial size was moderately dilated.   5. The posterior leaflet appears restrcited. There is moderate to severe  eccentric MR. The mitral valve is normal in structure. Moderate to severe  mitral valve regurgitation. No evidence of mitral stenosis.   6. The aortic valve is tricuspid. There is moderate calcification of the  aortic valve. Aortic valve regurgitation is trivial. Mild aortic valve  stenosis.   7. The inferior vena cava is dilated in size with >50% respiratory  variability, suggesting right atrial pressure of 8 mmHg.   Monitor 10/2022: Predominant rhythm is sinus with heart rate ranging from 71 bpm up to 225 bpm and average heart rate 84 bpm. There were rare PACs representing less than 1% total beats. Frequent PVCs were noted representing 23.7% total beats with otherwise rare ventricular couplets and triplets.  Several episodes of ventricular bigeminy and trigeminy were also noted. There were no pauses or episodes of high degree heart block.  Myoview on 09/15/2022:    Baseline EKG showed  normal sinus rhythm and frequent PVCs. Stress ECG is negative for ischemia but has frequent PVCs in a pattern of ventricular quadrigeminy and trigeminy.   LV perfusion is normal. There is no evidence of ischemia. There is no evidence of infarction.   Left ventricular function is normal. Nuclear stress EF: 55 %.   The study is normal. The study is low risk.   Carotid doppler on 09/08/2022:  Summary:  Right Carotid: Velocities in the right ICA are consistent with a 1-39%  stenosis. The ECA appears >50% stenosed.   Left Carotid: Velocities in the left ICA are consistent with a 60-79%  stenosis.  Hemodynamically significant plaque >50% visualized in the  CCA. The ECA appears >50% stenosed.   Vertebrals:  Right vertebral artery demonstrates antegrade flow. Left  vertebral artery demonstrates no discernable flow.  Subclavians: Normal flow hemodynamics were seen in bilateral subclavian arteries.  Echocardiogram on 07/16/2022:  1. Left ventricular ejection fraction, by estimation, is 50 to 55%. The  left ventricle has low normal function. The left ventricle has no regional  wall motion abnormalities. Left ventricular diastolic parameters are  consistent with Grade I diastolic  dysfunction (impaired relaxation).   2. Right ventricular systolic function is normal. The right ventricular  size is normal. Tricuspid regurgitation signal is inadequate for assessing  PA pressure.   3. The mitral valve is abnormal. Mild mitral valve regurgitation. No  evidence of mitral stenosis.   4. The aortic valve has an indeterminant number of cusps. There is mild  calcification of the aortic valve. There is mild thickening of the aortic  valve. Aortic valve regurgitation is mild to moderate. No aortic stenosis  is present.   5. The inferior vena cava is normal in size with greater than 50%  respiratory variability, suggesting right atrial pressure of 3 mmHg.   Comparison(s): Echocardiogram done 02/03/22  showed an EF of 40%.  72 hour Zio monitor on 02/16/22: Predominant rhythm is sinus with heart rate ranging from 83 bpm up to 116 bpm and average heart rate 94 bpm. There were rare PACs representing less than 1% total beats. There were frequent PVCs representing approximately 14% total beats, also frequent ventricular couplets and triplets with limited episodes of ventricular bigeminy and trigeminy.  Multiple, brief episodes of NSVT were noted, the longest of which was 7 beats.  There were no sustained ventricular arrhythmias. No pauses.  2D Echo on 02/03/22:  1. Left ventricular ejection fraction, by estimation, is 40%. The left  ventricle has mildly decreased function. The left ventricle demonstrates  global hypokinesis. Left ventricular diastolic parameters are  indeterminate. The average left ventricular  global longitudinal strain is -12.4 %. The global longitudinal strain is  abnormal.   2. Right ventricular systolic function is normal. The right ventricular  size is normal. Tricuspid regurgitation signal is inadequate for assessing  PA pressure.   3. A [name] pericardial effusion is present. The pericardial effusion is  circumferential. There is no evidence of cardiac tamponade.   4. The mitral valve is abnormal. Mild to moderate mitral valve  regurgitation. No evidence of mitral stenosis.   5. The aortic valve is tricuspid. There is mild calcification of the  aortic valve. There is mild thickening of the aortic valve. Aortic valve  regurgitation is mild.   6. The inferior vena cava is normal in size with greater than 50%  respiratory variability, suggesting right atrial pressure of 3 mmHg.   Comparison(s): Echocardiogram done 01/28/12 showed an EF of 65-70%.  Physical Exam:    VS:  BP 118/70   Pulse 79   SpO2 98%     Wt Readings from Last 3 Encounters:  09/03/23 135 lb 9.3 oz (61.5 kg)  04/30/23 191 lb (86.6 kg)  12/01/22 191 lb (86.6 kg)    GEN: Well nourished, well  developed in no acute distress, appears fatigued sitting in wheelchair HEENT: Normal NECK: No JVD; Carotid bruit along left neck, no carotid bruit along right neck.  CARDIAC: S1/S2, regular rate with abnormal rhythm due to early beats noted, no murmurs, rubs, gallops; 2+ peripheral pulses throughout, strong and equal bilaterally RESPIRATORY:  Clear and diminished to auscultation without rales, wheezing or rhonchi  MUSCULOSKELETAL:  No edema; Bilateral AKA, wheelchair bound, otherwise normal SKIN: Pale appearance, warm and dry NEUROLOGIC:  Alert and oriented x 3 PSYCHIATRIC:  Normal affect   ASSESSMENT & PLAN:    In order of problems listed above:  HFimpEF -> HFrEF Stage C, NYHA class I-II symptoms. Echo 07/2023 revealed EF 30-35%. Etiology unclear, although felt to be r/t high PVC burden, kidney function does not permit cath. Euvolemic and well compensated on exam, however has localized ascites to left and right lateral lower torso -CT of abdomen/pelvis in 08/2023  revealed mild ascites, moderate subcutaneous body wall edema, image findings were in keeping with anasarca.  This has been chronic/seems to be feeling worsening recently per patient's report. Low likelihood that increasing diuretic regimen will improve symptoms as she has had significant fluid loss from her hospital stay. Continue current GDMT. Low sodium diet, fluid restriction <2L, and daily weights encouraged. Educated to contact our office for weight gain of 2 lbs overnight or 5 lbs in one week. Heart healthy diet encouraged.    Palpitations, frequent PVC's Admits to labile HR's. Past work-up has revealed frequent PVC's. Currently has on monitor now. Continues to admit to palpitations.  I have encouraged her to cut back on this and to eliminate caffeine.  Continue current medication regimen. Heart healthy diet encouraged. Has upcoming appt with Dr. Nelly Laurence.   HTN Blood pressure today stable. Discussed to monitor BP at home at least 2  hours after medications and sitting for 5-10 minutes. Previously given BP log. Heart healthy diet recommended. Continue current medication regimen.  PAD, s/p Bilateral AKA, bilateral carotid artery stenosis Denies any symptoms. Following [facility].  Continue aspirin, rosuvastatin, and Plavix.  Heart healthy diet recommended.  Continue to follow-up with [facility].  Recent carotid doppler showed stable mild stenosis in right carotid artery, 1 to 39%, improved stenosis in left carotid artery, 40 to 59%. Continue current medication regimen. Will repeat study in 1 year.    HLD LDL 06/2022 was 47.  Continue Lipitor.  Heart healthy diet recommended. Plan to request labs at next office visit.   6. Tobacco abuse Patient continues to smoke; however she is not interested in quitting smoking at this time. Smoking cessation encouraged and discussed.  8. Weakness, nausea Etiology multifactorial.  She is currently wearing a monitor for further evaluation.  Recommended to continue follow-up with PCP for further evaluation. Care and ED precautions discussed.   9. CKD stage IV Most recent serum creatinine 2.35 with eGFR 24.  Avoid nephrotoxic agents.  No medication changes at this time. Continue to follow with PCP and Nephrology.   8. Disposition: Patient is requesting to follow-up with Dr. Diona [name] as scheduled.  Follow-up with Dr. Diona [name] as scheduled or with APP sooner if anything changes.   Medication Adjustments/Labs and Tests Ordered: Current medicines are reviewed at length with the patient today.  Concerns regarding medicines are outlined above.  Orders Placed This Encounter  Procedures   EKG 12-Lead   VAS US CAROTID   No orders of the defined types were placed in this encounter.   Signed, Sharon Dory, NP

## 2023-10-14 ENCOUNTER — Ambulatory Visit (HOSPITAL_COMMUNITY)
Admission: RE | Admit: 2023-10-14 | Discharge: 2023-10-14 | Disposition: A | Discharge status: Home / Self Care | Source: Ambulatory Visit | Attending: Nephrology | Admitting: Nephrology

## 2023-10-14 DIAGNOSIS — E1122 Type 2 diabetes mellitus with diabetic chronic kidney disease: Secondary | ICD-10-CM | POA: Diagnosis present

## 2023-10-14 DIAGNOSIS — N189 Chronic kidney disease, unspecified: Secondary | ICD-10-CM | POA: Diagnosis present

## 2023-10-14 DIAGNOSIS — I10 Essential (primary) hypertension: Secondary | ICD-10-CM

## 2023-10-14 DIAGNOSIS — N184 Chronic kidney disease, stage 4 (severe): Secondary | ICD-10-CM | POA: Diagnosis present

## 2023-10-14 DIAGNOSIS — D631 Anemia in chronic kidney disease: Secondary | ICD-10-CM | POA: Insufficient documentation

## 2023-10-27 ENCOUNTER — Encounter: Payer: Self-pay | Admitting: *Deleted

## 2023-10-29 ENCOUNTER — Ambulatory Visit: Payer: 59 | Attending: Cardiovascular Disease | Admitting: Cardiovascular Disease

## 2023-10-29 ENCOUNTER — Encounter: Payer: Self-pay | Admitting: Cardiovascular Disease

## 2023-10-29 VITALS — BP 130/56 | HR 50

## 2023-10-29 DIAGNOSIS — I5023 Acute on chronic systolic (congestive) heart failure: Secondary | ICD-10-CM

## 2023-10-29 DIAGNOSIS — I493 Ventricular premature depolarization: Secondary | ICD-10-CM | POA: Diagnosis not present

## 2023-10-29 NOTE — Progress Notes (Signed)
 Electrophysiology Office Note:    Date:  10/29/2023   ID:  [name], DOB 11/25/1970, MRN [id number]  PCP:  [name]   [facility] Providers Cardiologist:  Nona Dell, MD Electrophysiologist:  Maurice Small, MD     Referring MD: Royann Shivers, *   History of Present Illness:    [name] is a 53 y.o. female with a medical history significant for heart failure with slightly decreased ejection fraction, frequent PVCs, hypertension, PAD sepsis bilateral above-the-knee amputations, CKD 3, referred for management of PVCs.     She first came to the attention of cardiology in July 2023 when she saw Dr. Diona Browner for frequent palpitations and PVCs.  Her ejection fraction at that time was reduced at 40%, and a Zio patch showed a PVC burden of 5.4%.  After starting GDMT, her EF normalized.  In follow-up, the patient continued to notice palpitations, fatigue.  Repeat monitor showed a PVC burden of 24%.  She was admitted in February 2025 with acute on chronic CHF.  Her EF had declined to 30 to 35%, and her PVC burden was noted to be higher at greater than 20%.  She was switched back from propranolol, which was not effective in suppressing her PVCs, to metoprolol XL. Mexilitine was started.  The mexiletine may have helped some but she continues to have palpitations  She does not seem to have much benefit from atenolol, metoprolol, or propranolol.  Magnesium taurate did not make much of a difference.      Today, she is largely at baseline.  She feels euvolemic and compensated but continues to have palpitations though they have improved slightly for mexiletine.   EKGs/Labs/Other Studies Reviewed Today:    Echocardiogram:  07/16/2022 EF 50 to 55%, mild mitral regurgitation     TTE 05/20/2023     EF 45 to 50%.  Elevated LVEDP.  Small pericardial effusion anterior to the right ventricle localized to the right atrium.  Monitors:  Zio 10d  08/2022 Sinus rhythm heart rate 70-125 beats minute, average 84 23.7% PVC burden, no symptoms reported no other arrhythmia detected   Stress testing:  09/15/2022 No evidence of ischemia. Frequent PVCs noted  Advanced imaging:   Cardiac catherization   EKG:      Reviewed today.  PVCs are + II, - III, isoelectric aVF. - V1, + V2, -V3; +I   Physical Exam:    VS:  There were no vitals taken for this visit.    Wt Readings from Last 3 Encounters:  09/03/23 135 lb 9.3 oz (61.5 kg)  04/30/23 191 lb (86.6 kg)  12/01/22 191 lb (86.6 kg)     GEN: Well nourished, well developed in no acute distress CARDIAC: RRR with occasional abnormal beat, no murmurs, rubs, gallops RESPIRATORY:  Normal work of breathing MUSCULOSKELETAL: Status post bilateral AKA    ASSESSMENT & PLAN:    Frequent PVCs Symptomatic with mild palpitations Burden has increased to 25%, with a dominant morphology She also has fatigue, but it is uncertain whether these correlate with the PVCs She is not a great candidate for ablation due to bilateral AKA, though options are limited, and with a burden > 20% and declining EF, we will have to make an attempt to eradicate the PVCs Using shared decision making approach, we we will plan to schedule EP study and ablation.  We discussed the indication, rationale, logistics, anticipated benefits, and potential risks of the ablation procedure including but  not limited to -- bleed at the groin access site, chest pain, damage to nearby organs such as the diaphragm, lungs, or esophagus, need for a drainage tube, or prolonged hospitalization. I explained that the risk for stroke, heart attack, need for open chest surgery, or even death is very low but not zero. she  expressed understanding and wishes to proceed.   CHF with recovered ejection fraction EF has improved despite PVCs in the past Decreasing EF has correlated with increased PVC burden   Tobacco abuse Smoking cessation  encouraged  Vascular disease --peripheral, carotid, coronary On aspirin 81, Lipitor 80, clopidogrel 75   Signed, Maurice Small, MD  10/29/2023 12:06 PM    [facility]

## 2023-10-29 NOTE — Patient Instructions (Signed)
 Medication Instructions:   Continue all current medications.   Labwork:  none  Testing/Procedures:  Your physician has recommended that you have an ablation. Catheter ablation is a medical procedure used to treat some cardiac arrhythmias (irregular heartbeats). During catheter ablation, a long, thin, flexible tube is put into a blood vessel in your groin (upper thigh), or neck. This tube is called an ablation catheter. It is then guided to your heart through the blood vessel. Radio frequency waves destroy small areas of heart tissue where abnormal heartbeats may cause an arrhythmia to start. Please see the instruction sheet given to you today.   Follow-Up:  Pending   Any Other Special Instructions Will Be Listed Below (If Applicable).   If you need a refill on your cardiac medications before your next appointment, please call your pharmacy.

## 2023-11-03 ENCOUNTER — Telehealth: Payer: Self-pay

## 2023-11-03 DIAGNOSIS — I493 Ventricular premature depolarization: Secondary | ICD-10-CM

## 2023-11-03 NOTE — Telephone Encounter (Signed)
 Attempted to contact patient to schedule PVC ablation - left message to call our office back or send in MyChart message

## 2023-11-04 NOTE — Telephone Encounter (Signed)
 Pt was returning call to nurse and is requesting a callback at [phone]. She stated she didn't see anything in MyChart to respond to. Please advise

## 2023-11-05 NOTE — Addendum Note (Signed)
 Addended by: Sherle Poe R on: 11/05/2023 10:21 AM   Modules accepted: Orders

## 2023-11-05 NOTE — Telephone Encounter (Signed)
 Spoke with patient, PVC ablation scheduled for 02/04/24. Patient will have labs completed on 01/17/24. No further needs at this time

## 2023-11-11 ENCOUNTER — Encounter: Payer: Self-pay | Admitting: Orthopaedic Surgery

## 2023-11-11 ENCOUNTER — Ambulatory Visit: Admitting: Orthopaedic Surgery

## 2023-11-11 DIAGNOSIS — M25512 Pain in left shoulder: Secondary | ICD-10-CM | POA: Diagnosis not present

## 2023-11-11 DIAGNOSIS — G8929 Other chronic pain: Secondary | ICD-10-CM | POA: Diagnosis not present

## 2023-11-11 NOTE — Progress Notes (Signed)
 PROCEDURE NOTE:  The patient request injection, verbal consent was obtained.  The left shoulder was prepped appropriately after time out was performed.   Sterile technique was observed and injection of 1 cc of DepoMedrol 40mg  with several cc's of plain xylocaine. Anesthesia was provided by ethyl chloride and a 20-gauge needle was used to inject the shoulder area. A posterior approach was used.  The injection was tolerated well.  A band aid dressing was applied.  The patient was advised to apply ice later today and tomorrow to the injection sight as needed.  Encounter Diagnosis  Name Primary?   Chronic pain in left shoulder Yes   Return in three weeks.  If better, cancel.  Call if any problem.  Precautions discussed.  Electronically Signed Pleasant Brilliant, MD 4/17/202510:46 AM

## 2023-11-18 ENCOUNTER — Other Ambulatory Visit: Payer: Self-pay | Admitting: Cardiology

## 2023-12-02 ENCOUNTER — Encounter: Payer: Self-pay | Admitting: Orthopaedic Surgery

## 2023-12-02 ENCOUNTER — Ambulatory Visit: Admitting: Orthopaedic Surgery

## 2023-12-02 DIAGNOSIS — G8929 Other chronic pain: Secondary | ICD-10-CM

## 2023-12-02 DIAGNOSIS — M25512 Pain in left shoulder: Secondary | ICD-10-CM

## 2023-12-02 MED ORDER — HYDROCODONE-ACETAMINOPHEN 5-325 MG PO TABS: ORAL_TABLET | ORAL | 0 refills | Status: DC

## 2023-12-02 NOTE — Addendum Note (Signed)
 Addended by: Acey Ace on: 12/02/2023 10:54 AM   Modules accepted: Orders

## 2023-12-02 NOTE — Progress Notes (Signed)
 My shoulder hurts more.  The injection for the left shoulder did not help that much. She has more pain, less motion.  She has no new trauma.  She is in a wheelchair and has bilateral above knee amputations.  Left shoulder has decreased ROM, pain, positive drop sign, NV intact.  Encounter Diagnosis  Name Primary?   Chronic pain in left shoulder Yes   I will get MRI of the left shoulder.  She will need open unit.  I have reviewed the [place]  Controlled Substance Reporting System web site prior to prescribing narcotic medicine for this patient.  Return in three weeks.  Call if any problem.  Precautions discussed.  Electronically Signed Pleasant Brilliant, MD 5/8/202510:47 AM

## 2023-12-21 ENCOUNTER — Other Ambulatory Visit: Payer: Self-pay | Admitting: Cardiovascular Disease

## 2023-12-21 DIAGNOSIS — R002 Palpitations: Secondary | ICD-10-CM

## 2023-12-29 ENCOUNTER — Ambulatory Visit: Payer: Self-pay | Admitting: Nurse Practitioner

## 2023-12-29 ENCOUNTER — Encounter: Payer: Self-pay | Admitting: Cardiology

## 2023-12-29 ENCOUNTER — Ambulatory Visit: Payer: 59 | Attending: Cardiology | Admitting: Cardiology

## 2023-12-29 VITALS — BP 150/80 | HR 88 | Wt 128.0 lb

## 2023-12-29 DIAGNOSIS — N1832 Chronic kidney disease, stage 3b: Secondary | ICD-10-CM

## 2023-12-29 DIAGNOSIS — Z720 Tobacco use: Secondary | ICD-10-CM

## 2023-12-29 DIAGNOSIS — I5020 Unspecified systolic (congestive) heart failure: Secondary | ICD-10-CM

## 2023-12-29 DIAGNOSIS — I6523 Occlusion and stenosis of bilateral carotid arteries: Secondary | ICD-10-CM

## 2023-12-29 DIAGNOSIS — I493 Ventricular premature depolarization: Secondary | ICD-10-CM | POA: Diagnosis not present

## 2023-12-29 NOTE — Patient Instructions (Addendum)
Medication Instructions:  Your physician recommends that you continue on your current medications as directed. Please refer to the Current Medication list given to you today.   Labwork: None  Testing/Procedures: None  Follow-Up: Your physician recommends that you schedule a follow-up appointment in: 3 months  Any Other Special Instructions Will Be Listed Below (If Applicable).  Thank you for choosing  [facility]!      If you need a refill on your cardiac medications before your next appointment, please call your pharmacy.

## 2023-12-29 NOTE — Progress Notes (Signed)
 Cardiology Office Note  Date: 12/29/2023   ID: [name], DOB April 04, 1971, MRN [id number]  History of Present Illness: [name] is a 53 y.o. female last seen in March by Ms. Clementine Cutting NP, I reviewed her note.  She has also had interval follow-up with Dr. Arlester Ladd.  She is here today with her daughter for a follow-up visit.  She does not report any angina, has had no syncope.  Does experience intermittent palpitations and weakness.  Went over her medications.  She has been off Entresto  since hospitalization earlier in the year in the setting of acute on chronic renal failure.  She is following with Dr. Carrolyn Clan and has pending lab work later this month.  I reviewed her follow-up echocardiogram from January at which point LVEF had decreased to the range of 30 to 35%.  She has frequent PVCs, approximately 25% total beats by cardiac monitor in April and plan at this point is for attempted PVC ablation by Dr. Arlester Ladd in July.  She otherwise remains on mexiletine and beta-blocker.  Physical Exam: VS:  BP (!) 150/80 (BP Location: Left Arm)   Pulse 88   Wt 128 lb (58.1 kg)   SpO2 96%   BMI 20.66 kg/m , BMI Body mass index is 20.66 kg/m.  Wt Readings from Last 3 Encounters:  12/29/23 128 lb (58.1 kg)  09/03/23 135 lb 9.3 oz (61.5 kg)  04/30/23 191 lb (86.6 kg)    General: In wheelchair. HEENT: Conjunctiva and lids normal. Neck: Supple, no elevated JVP or carotid bruits. Lungs: Clear to auscultation, nonlabored breathing at rest. Cardiac: Regular rate and rhythm with ectopy, no S3 or significant systolic murmur, no pericardial rub. Extremities: Status post bilateral AKA.  ECG:  An ECG dated 10/05/2023 was personally reviewed today and demonstrated:  Sinus rhythm with PVCs, nonspecific ST changes.  Labwork: 08/21/2023: B Natriuretic Peptide >4,500.0 08/22/2023: TSH 4.790 08/26/2023: Magnesium  2.1 08/30/2023: ALT 9; AST 21 08/31/2023: Hemoglobin 9.6; Platelets 213 09/02/2023: BUN 45;  Creatinine, Ser 2.35; Potassium 3.8; Sodium 130   Other Studies Reviewed Today:  Cardiac monitor April 2025: ZIO AT reviewed.  6 days, 9 hours analyzed.   Predominant rhythm is sinus with heart rate ranging from 69 bpm up to 127 bpm and average heart rate 85 bpm. There were frequent PVCs representing 25.1% total beats with frequent ventricular couplets representing 18.4% total beats and otherwise rare ventricular triplets.  Also ventricular bigeminy and trigeminy noted. There were multiple, brief episodes of NSVT, the longest of which lasted 6 beats. There were rare PACs representing less than 1% total beats.  A single run of PSVT was noted lasting 7 beats. No pauses or high degree heart block.  Assessment and Plan:  1.  HFrEF, LVEF decreased to the range of 30 to 35% by follow-up echocardiogram in January.  Myoview  was nonischemic in February of last year although she almost certainly has underlying CAD to some degree.  LVEF potentially affected by frequent PVCs.  Plan to continue Toprol -XL 25 mg daily, Lasix  40 mg twice daily, and Jardiance  25 mg daily.  She has been off Entresto  24/26 mg twice daily since last hospitalization although it looks like renal function has improved since then.  Would like to resume if possible, but will check in with nephrology first.   2.  Frequent PVCs.  Following with Dr. Arlester Ladd.  She continues on beta-blocker and mexiletine with plan for EP study and potential PVC ablation in July.  No syncope.  3.  Severe PAD status post bilateral AKA.   4.  Ongoing tobacco abuse.   5.  Mixed hyperlipidemia, LDL 47 in December 2023.  She continues on Crestor  40 mg daily.  6.  Carotid artery disease, asymptomatic.  Follow-up Dopplers in February revealed 1 to 39% RICA stenosis and 40 to 59% LICA stenosis.  Continue antiplatelet regimen and statin.  7.  Mitral valve disease with restricted posterior leaflet associated with moderate to severe eccentric mitral regurgitation  by echocardiogram in January.  Left atrium severely dilated.  Disposition:  Follow up 3 months.  Signed, Gerard Knight, M.D., F.A.C.C. [facility]

## 2023-12-31 ENCOUNTER — Telehealth: Payer: Self-pay

## 2023-12-31 NOTE — Telephone Encounter (Signed)
 Spoke with pt and went over instructions. She is aware of date/time of labs and Ablation.

## 2023-12-31 NOTE — Telephone Encounter (Signed)
Patient returned staff call. 

## 2023-12-31 NOTE — Telephone Encounter (Signed)
 Called pt to go over PVC Ablation Instructions - LM for pt to call back.

## 2024-01-07 ENCOUNTER — Telehealth: Payer: Self-pay | Admitting: Cardiology

## 2024-01-07 NOTE — Telephone Encounter (Signed)
 Pt c/o medication issue:  1. Name of Medication: Entresto    2. How are you currently taking this medication (dosage and times per day)?    3. Are you having a reaction (difficulty breathing--STAT)? No   4. What is your medication issue? Calling to see about medication rather she needs to take it before her labs or not. Please advise

## 2024-01-07 NOTE — Telephone Encounter (Signed)
 Left message to return call

## 2024-01-10 NOTE — Telephone Encounter (Signed)
 Left detailed message okay per patient dpr.

## 2024-01-10 NOTE — Telephone Encounter (Signed)
 Left message for patient to call back

## 2024-01-12 NOTE — Telephone Encounter (Signed)
 Detailed message left on voice mail - no need to hold medications prior to her labs.   If she has further questions, she call call the office back or reply via mychart.

## 2024-01-12 NOTE — Telephone Encounter (Signed)
 Pt returning call to a nurse. She would like to be c/b on her mobile # [phone]

## 2024-01-17 LAB — BASIC METABOLIC PANEL WITH GFR
BUN/Creatinine Ratio: 17 (ref 9–23)
BUN: 33 mg/dL — ABNORMAL HIGH (ref 6–24)
CO2: 18 mmol/L — ABNORMAL LOW (ref 20–29)
Calcium: 9.1 mg/dL (ref 8.7–10.2)
Chloride: 98 mmol/L (ref 96–106)
Creatinine, Ser: 1.9 mg/dL — ABNORMAL HIGH (ref 0.57–1.00)
Glucose: 361 mg/dL — ABNORMAL HIGH (ref 70–99)
Potassium: 4.8 mmol/L (ref 3.5–5.2)
Sodium: 132 mmol/L — ABNORMAL LOW (ref 134–144)
eGFR: 31 mL/min/{1.73_m2} — ABNORMAL LOW (ref >59)

## 2024-01-17 LAB — CBC
Hematocrit: 42.3 % (ref 34.0–46.6)
Hemoglobin: 13.4 g/dL (ref 11.1–15.9)
MCH: 30.7 pg (ref 26.6–33.0)
MCHC: 31.7 g/dL (ref 31.5–35.7)
MCV: 97 fL (ref 79–97)
Platelets: 253 10*3/uL (ref 150–450)
RBC: 4.37 x10E6/uL (ref 3.77–5.28)
RDW: 13.2 % (ref 11.7–15.4)
WBC: 9.6 10*3/uL (ref 3.4–10.8)

## 2024-01-19 ENCOUNTER — Ambulatory Visit: Payer: Self-pay

## 2024-01-26 ENCOUNTER — Telehealth (HOSPITAL_COMMUNITY): Payer: Self-pay

## 2024-01-26 NOTE — Telephone Encounter (Signed)
 Attempted to reach patient to discuss upcoming procedure, no answer. Left VM for patient to return call.

## 2024-02-03 NOTE — Pre-Procedure Instructions (Signed)
 Instructed patient on the following items: Arrival time 1000 Nothing to eat or drink after midnight No meds AM of procedure Responsible person to drive you home and stay with you for 24 hrs

## 2024-02-04 ENCOUNTER — Encounter (HOSPITAL_COMMUNITY)
Admission: RE | Disposition: A | Discharge status: Home / Self Care | Payer: Self-pay | Source: Home / Self Care | Attending: Cardiovascular Disease

## 2024-02-04 ENCOUNTER — Ambulatory Visit (HOSPITAL_BASED_OUTPATIENT_CLINIC_OR_DEPARTMENT_OTHER): Payer: Self-pay | Admitting: Student

## 2024-02-04 ENCOUNTER — Ambulatory Visit (HOSPITAL_COMMUNITY)
Admission: RE | Admit: 2024-02-04 | Discharge: 2024-02-04 | Disposition: A | Discharge status: Home / Self Care | Attending: Cardiovascular Disease | Admitting: Cardiovascular Disease

## 2024-02-04 ENCOUNTER — Encounter (HOSPITAL_COMMUNITY): Payer: Self-pay | Admitting: Cardiovascular Disease

## 2024-02-04 ENCOUNTER — Ambulatory Visit (HOSPITAL_COMMUNITY): Payer: Self-pay | Admitting: Student

## 2024-02-04 ENCOUNTER — Other Ambulatory Visit: Payer: Self-pay

## 2024-02-04 DIAGNOSIS — I739 Peripheral vascular disease, unspecified: Secondary | ICD-10-CM | POA: Diagnosis not present

## 2024-02-04 DIAGNOSIS — F17200 Nicotine dependence, unspecified, uncomplicated: Secondary | ICD-10-CM | POA: Insufficient documentation

## 2024-02-04 DIAGNOSIS — Z89612 Acquired absence of left leg above knee: Secondary | ICD-10-CM | POA: Insufficient documentation

## 2024-02-04 DIAGNOSIS — I493 Ventricular premature depolarization: Secondary | ICD-10-CM

## 2024-02-04 DIAGNOSIS — N183 Chronic kidney disease, stage 3 unspecified: Secondary | ICD-10-CM | POA: Diagnosis not present

## 2024-02-04 DIAGNOSIS — E1122 Type 2 diabetes mellitus with diabetic chronic kidney disease: Secondary | ICD-10-CM | POA: Diagnosis not present

## 2024-02-04 DIAGNOSIS — I130 Hypertensive heart and chronic kidney disease with heart failure and stage 1 through stage 4 chronic kidney disease, or unspecified chronic kidney disease: Secondary | ICD-10-CM | POA: Insufficient documentation

## 2024-02-04 DIAGNOSIS — J449 Chronic obstructive pulmonary disease, unspecified: Secondary | ICD-10-CM | POA: Diagnosis not present

## 2024-02-04 DIAGNOSIS — I509 Heart failure, unspecified: Secondary | ICD-10-CM | POA: Diagnosis not present

## 2024-02-04 DIAGNOSIS — K219 Gastro-esophageal reflux disease without esophagitis: Secondary | ICD-10-CM | POA: Diagnosis not present

## 2024-02-04 DIAGNOSIS — Z89611 Acquired absence of right leg above knee: Secondary | ICD-10-CM | POA: Diagnosis not present

## 2024-02-04 DIAGNOSIS — F17210 Nicotine dependence, cigarettes, uncomplicated: Secondary | ICD-10-CM

## 2024-02-04 DIAGNOSIS — I5023 Acute on chronic systolic (congestive) heart failure: Secondary | ICD-10-CM | POA: Diagnosis not present

## 2024-02-04 LAB — GLUCOSE, CAPILLARY
Glucose-Capillary: 154 mg/dL — ABNORMAL HIGH (ref 70–99)
Glucose-Capillary: 126 mg/dL — ABNORMAL HIGH (ref 70–99)

## 2024-02-04 SURGERY — PVC ABLATION
Anesthesia: General

## 2024-02-04 MED ORDER — MIDAZOLAM HCL 2 MG/2ML IJ SOLN
INTRAMUSCULAR | Status: AC
  Filled 2024-02-04: qty 2

## 2024-02-04 MED ORDER — DEXMEDETOMIDINE HCL IN NACL 80 MCG/20ML IV SOLN
INTRAVENOUS | Status: DC | PRN
Start: 2024-02-04 — End: 2024-02-04
  Administered 2024-02-04: 8 ug via INTRAVENOUS
  Administered 2024-02-04: 4 ug via INTRAVENOUS

## 2024-02-04 MED ORDER — FENTANYL CITRATE (PF) 100 MCG/2ML IJ SOLN
INTRAMUSCULAR | Status: DC | PRN
  Administered 2024-02-04 (×2): 50 ug via INTRAVENOUS

## 2024-02-04 MED ORDER — MIDAZOLAM HCL 2 MG/2ML IJ SOLN
INTRAMUSCULAR | Status: DC | PRN
  Administered 2024-02-04 (×2): 1 mg via INTRAVENOUS

## 2024-02-04 MED ORDER — HEPARIN (PORCINE) IN NACL 1000-0.9 UT/500ML-% IV SOLN
INTRAVENOUS | Status: DC | PRN
  Administered 2024-02-04: 500 mL

## 2024-02-04 MED ORDER — ISOPROTERENOL HCL 0.2 MG/ML IJ SOLN
INTRAMUSCULAR | Status: AC
  Filled 2024-02-04: qty 5

## 2024-02-04 MED ORDER — ACETAMINOPHEN 325 MG PO TABS
650.0000 mg | ORAL_TABLET | ORAL | Status: DC | PRN
  Administered 2024-02-04: 650 mg via ORAL
  Filled 2024-02-04: qty 2

## 2024-02-04 MED ORDER — SODIUM CHLORIDE 0.9 % IV SOLN: INTRAVENOUS | Status: DC

## 2024-02-04 MED ORDER — ISOPROTERENOL HCL 0.2 MG/ML IJ SOLN
INTRAVENOUS | Status: DC | PRN
  Administered 2024-02-04: 2 ug/min via INTRAVENOUS

## 2024-02-04 MED ORDER — ACETAMINOPHEN 10 MG/ML IV SOLN
1000.0000 mg | Freq: Once | INTRAVENOUS | Status: AC
  Administered 2024-02-04: 1000 mg via INTRAVENOUS
  Filled 2024-02-04: qty 100

## 2024-02-04 MED ORDER — BUPIVACAINE HCL (PF) 0.25 % IJ SOLN
INTRAMUSCULAR | Status: DC | PRN
  Administered 2024-02-04: 60 mL

## 2024-02-04 MED ORDER — SODIUM CHLORIDE 0.9% FLUSH: 3.0000 mL | INTRAVENOUS | Status: DC | PRN

## 2024-02-04 MED ORDER — SODIUM CHLORIDE 0.9% FLUSH: 3.0000 mL | Freq: Two times a day (BID) | INTRAVENOUS | Status: DC

## 2024-02-04 MED ORDER — FENTANYL CITRATE (PF) 100 MCG/2ML IJ SOLN
INTRAMUSCULAR | Status: AC
  Filled 2024-02-04: qty 2

## 2024-02-04 MED ORDER — ONDANSETRON HCL 4 MG/2ML IJ SOLN: 4.0000 mg | Freq: Four times a day (QID) | INTRAMUSCULAR | Status: DC | PRN

## 2024-02-04 MED ORDER — SODIUM CHLORIDE 0.9 % IV SOLN: 250.0000 mL | INTRAVENOUS | Status: DC | PRN

## 2024-02-04 SURGICAL SUPPLY — 9 items
CATH DECANAV D CURVE (CATHETERS) IMPLANT
CATH JSN HEX 2-5-2 120 (CATHETERS) IMPLANT
DEVICE CLOSURE MYNXGRIP 6/7F (Vascular Products) IMPLANT
PACK EP LF (CUSTOM PROCEDURE TRAY) ×1 IMPLANT
PAD DEFIB RADIO PHYSIO CONN (PAD) ×1 IMPLANT
PATCH CARTO3 (PAD) IMPLANT
SHEATH PINNACLE 6F 10CM (SHEATH) IMPLANT
SHEATH PINNACLE 8F 10CM (SHEATH) IMPLANT
SHEATH PROBE COVER 6X72 (BAG) IMPLANT

## 2024-02-04 NOTE — Transfer of Care (Signed)
 Immediate Anesthesia Transfer of Care Note  Patient: [name]  Procedure(s) Performed: PVC ABLATION  Patient Location: Cath Lab  Anesthesia Type:MAC  Level of Consciousness: awake, alert , and oriented  Airway & Oxygen Therapy: Patient Spontanous Breathing  Post-op Assessment: Report given to RN, Post -op Vital signs reviewed and stable, Patient moving all extremities X 4, and Patient able to stick tongue midline  Post vital signs: Reviewed and stable  Last Vitals:  Vitals Value Taken Time  BP 121/66   Temp 98.6   Pulse 77   Resp 10   SpO2 97     Last Pain:  Vitals:   02/04/24 1034  TempSrc: Oral  PainSc:       Patients Stated Pain Goal: 4 (02/04/24 1027)  Complications: There were no known notable events for this encounter.

## 2024-02-04 NOTE — Discharge Instructions (Signed)

## 2024-02-04 NOTE — Anesthesia Preprocedure Evaluation (Addendum)
 Anesthesia Evaluation  Patient identified by MRN, date of birth, ID band Patient awake    Reviewed: Allergy & Precautions, H&P , NPO status , Patient's Chart, lab work & pertinent test results, reviewed documented beta blocker date and time   Airway Mallampati: II  TM Distance: >3 FB Neck ROM: Full    Dental  (+) Edentulous Upper, Edentulous Lower   Pulmonary shortness of breath, asthma , pneumonia, COPD, Current Smoker and Patient abstained from smoking.   Pulmonary exam normal breath sounds clear to auscultation       Cardiovascular hypertension, Pt. on medications and Pt. on home beta blockers + Peripheral Vascular Disease and +CHF  + Valvular Problems/Murmurs MR and AS  Rhythm:Irregular Rate:Normal  Echo 07/2023  1. Left ventricular ejection fraction, by estimation, is 30 to 35%. The left ventricle has moderately decreased function. The left ventricle has no regional wall motion abnormalities. Left ventricular diastolic function could not be evaluated.   2. Right ventricular systolic function is moderately reduced. The right ventricular size is normal.   3. Left atrial size was severely dilated.   4. Right atrial size was moderately dilated.   5. The posterior leaflet appears restrcited. There is moderate to severe eccentric MR. The mitral valve is normal in structure. Moderate to severe mitral valve regurgitation. No evidence of mitral stenosis.   6. The aortic valve is tricuspid. There is moderate calcification of the aortic valve. Aortic valve regurgitation is trivial. Mild aortic valve stenosis.   7. The inferior vena cava is dilated in size with >50% respiratory variability, suggesting right atrial pressure of 8 mmHg.     Neuro/Psych  Headaches PSYCHIATRIC DISORDERS Anxiety Depression       GI/Hepatic ,GERD  Medicated,,  Endo/Other  diabetes, Type 2    Renal/GU Renal disease     Musculoskeletal  (+) Arthritis ,     Abdominal  (+) + obese  Peds  Hematology  (+) Blood dyscrasia, anemia   Anesthesia Other Findings   Reproductive/Obstetrics                              Anesthesia Physical Anesthesia Plan  ASA: 3  Anesthesia Plan: MAC   Post-op Pain Management: Minimal or no pain anticipated   Induction: Intravenous  PONV Risk Score and Plan: 2 and Ondansetron , Midazolam  and Treatment may vary due to age or medical condition  Airway Management Planned:   Additional Equipment:   Intra-op Plan:   Post-operative Plan:   Informed Consent: I have reviewed the patients History and Physical, chart, labs and discussed the procedure including the risks, benefits and alternatives for the proposed anesthesia with the patient or authorized representative who has indicated his/her understanding and acceptance.     Dental advisory given  Plan Discussed with: CRNA  Anesthesia Plan Comments: (PAT note from 09/22/2021 by Lynwood Hope, PA-C: Patient seen by cardiology at [facility] clinic for evaluation prior to undergoing vascular bypass surgery lower extremity.  She was seen by Dr. Miguel 01/25/2019 who ordered nuclear stress test for risk stratification.  Nuclear stress demonstrated normal myocardial perfusion and she was cleared for surgery.  Patient was admitted January 2021 at [facility] for sepsis secondary to BKA stump infection.  She is noted to have elevated troponin which was suspected to be secondary to demand ischemia due to severe anemia and acute pulmonary edema.  She had echo showing mildly reduced LV function.  Per hospitalist note 08/29/2019, Reviewed echo with ejection fraction of 45% and does have some prior scarring but no new wall motion abnormalities.  She was recommended to continue aspirin , statin, and beta-blocker.  Per vascular surgery instructions, patient was instructed to hold Plavix  5 days prior to procedure.  Markedly  uncontrolled IDDM2.  Last A1c 12.1 on 08/04/2021 at PCP office.  Patient will need day of surgery labs and evaluation.  EKG 09/05/2021: Sinus rhythm with frequent PVCs.  Rate 93.  Low voltage QRS.  TTE 08/27/2019: Summary  1. Mildly increased left ventricular internal cavity size.  2. LV systolic normal is low normal or mildly reduced with LVEF 45-50% with akinesis and thinning of the inferior base compatible with scar.  3. Trivial pericardial effusion.  4. No significant valvular disease.  5. No intracardiac thrombi, mass or vegetations.   Nuclear stress 01/31/2019 (Care Everywhere): IMPRESSION:  SPECT imaging shows normal perfusion and function.  EF is normal at > 65%.  No transient ischemic dilatation.  Hot gut uptake of tracer.  )         Anesthesia Quick Evaluation

## 2024-02-04 NOTE — H&P (Signed)
 Electrophysiology Office Note:    Date:  02/04/2024   ID:  [name], DOB 11-20-1970, MRN [id number]  PCP:  Jeanette Comer FORBES DEVONNA   [facility] Providers Cardiologist:  Jayson Sierras, MD Electrophysiologist:  Eulas FORBES Furbish, MD     Referring MD: No ref. provider found   History of Present Illness:    [name] is a 53 y.o. female with a medical history significant for heart failure with slightly decreased ejection fraction, frequent PVCs, hypertension, PAD sepsis bilateral above-the-knee amputations, CKD 3, referred for management of PVCs.     She first came to the attention of cardiology in July 2023 when she saw Dr. Sierras for frequent palpitations and PVCs.  Her ejection fraction at that time was reduced at 40%, and a Zio patch showed a PVC burden of 5.4%.  After starting GDMT, her EF normalized.  In follow-up, the patient continued to notice palpitations, fatigue.  Repeat monitor showed a PVC burden of 24%.  She was admitted in February 2025 with acute on chronic CHF.  Her EF had declined to 30 to 35%, and her PVC burden was noted to be higher at greater than 20%.  She was switched back from propranolol , which was not effective in suppressing her PVCs, to metoprolol  XL. Mexilitine was started.  The mexiletine may have helped some but she continues to have palpitations  She does not seem to have much benefit from atenolol, metoprolol , or propranolol .  Magnesium  taurate did not make much of a difference.      Today, she is largely at baseline.  She feels euvolemic and compensated but continues to have palpitations though they have improved slightly with mexiletine.   She presents today for ablation. She denies any changes in medications, diagnoses, or condition since her last visit with me.   EKGs/Labs/Other Studies Reviewed Today:    Echocardiogram:  07/16/2022 EF 50 to 55%, mild mitral regurgitation     TTE 05/20/2023     EF 45 to 50%.   Elevated LVEDP.  Small pericardial effusion anterior to the right ventricle localized to the right atrium.  Monitors:  Zio 10d 08/2022 Sinus rhythm heart rate 70-125 beats minute, average 84 23.7% PVC burden, no symptoms reported no other arrhythmia detected   Stress testing:  09/15/2022 No evidence of ischemia. Frequent PVCs noted  Advanced imaging:   Cardiac catherization   EKG:      Reviewed today.  PVCs are + II, - III, isoelectric aVF. - V1, + V2, -V3; +I   Physical Exam:    VS:  BP (!) 169/69   Pulse (!) 46   Temp 97.9 F (36.6 C) (Oral)   Resp 18   Ht 4' 5 (1.346 m)   Wt 58.1 kg   SpO2 99%   BMI 32.04 kg/m     Wt Readings from Last 3 Encounters:  02/04/24 58.1 kg  12/29/23 58.1 kg  09/03/23 61.5 kg     GEN: Well nourished, well developed in no acute distress CARDIAC: RRR with occasional abnormal beat, no murmurs, rubs, gallops RESPIRATORY:  Normal work of breathing MUSCULOSKELETAL: Status post bilateral AKA    ASSESSMENT & PLAN:    Frequent PVCs Symptomatic with mild palpitations Burden has increased to 25%, with a dominant morphology She also has fatigue, but it is uncertain whether these correlate with the PVCs She is not a great candidate for ablation due to bilateral AKA, though options are limited, and with a burden >  20% and declining EF, we will have to make an attempt to eradicate the PVCs Using shared decision making approach, we we will plan to schedule EP study and ablation.  We discussed the indication, rationale, logistics, anticipated benefits, and potential risks of the ablation procedure including but not limited to -- bleed at the groin access site, chest pain, damage to nearby organs such as the diaphragm, lungs, or esophagus, need for a drainage tube, or prolonged hospitalization. I explained that the risk for stroke, heart attack, need for open chest surgery, or even death is very low but not zero. she  expressed understanding and  wishes to proceed.   CHF with recovered ejection fraction EF has improved despite PVCs in the past Decreasing EF has correlated with increased PVC burden   Tobacco abuse Smoking cessation encouraged  Vascular disease --peripheral, carotid, coronary On aspirin  81, Lipitor 80, clopidogrel  75   Signed, Eulas FORBES Furbish, MD  02/04/2024 11:26 AM    [facility]

## 2024-02-05 ENCOUNTER — Encounter (HOSPITAL_COMMUNITY): Payer: Self-pay | Admitting: Cardiovascular Disease

## 2024-02-07 ENCOUNTER — Telehealth (HOSPITAL_COMMUNITY): Payer: Self-pay

## 2024-02-07 NOTE — Anesthesia Postprocedure Evaluation (Signed)
 Anesthesia Post Note  Patient: [name]  Procedure(s) Performed: PVC ABLATION     Patient location during evaluation: PACU Anesthesia Type: General Level of consciousness: sedated and patient cooperative Pain management: pain level controlled Vital Signs Assessment: post-procedure vital signs reviewed and stable Respiratory status: spontaneous breathing Cardiovascular status: stable Anesthetic complications: no   There were no known notable events for this encounter.  Last Vitals:  Vitals:   02/04/24 1615 02/04/24 1630  BP:    Pulse: 89 96  Resp: 13 20  Temp:    SpO2: 100% 98%    Last Pain:  Vitals:   02/04/24 1622  TempSrc:   PainSc: 3                  Norleen Pope

## 2024-02-07 NOTE — Telephone Encounter (Signed)
 Spoke with patient to complete post procedure follow up call.  Patient reports no complications with groin sites.   Instructions reviewed with patient:  Remove large bandage at puncture site after 24 hours. It is normal to have bruising, tenderness, mild swelling, and a pea or marble sized lump/knot at the groin site which can take up to three months to resolve.  Get help right away if you notice sudden swelling at the puncture site.  Check your puncture site every day for signs of infection: fever, redness, swelling, pus drainage, warmth, foul odor or excessive pain. If this occurs, please call the office at [phone], to speak with the nurse. Get help right away if your puncture site is bleeding and the bleeding does not stop after applying firm pressure to the area.  You may continue to have skipped beats during the first several months after your procedure.  You will follow up with Dr.Augustus Mealor on 03/10/24. Patient requests appointment to be in [employer] because of her location and preferably in the morning. Message sent to scheduling.  Patient verbalized understanding to all instructions provided.

## 2024-03-10 ENCOUNTER — Ambulatory Visit: Admitting: Cardiovascular Disease

## 2024-03-13 NOTE — Progress Notes (Unsigned)
 Electrophysiology Office Note:    Date:  03/14/2024   ID:  [name], DOB 10-13-1970, MRN [id number]  PCP:  Jeanette Comer FORBES DEVONNA   [facility] Providers Cardiologist:  Jayson Sierras, MD Electrophysiologist:  Eulas FORBES Furbish, MD     Referring MD: Jeanette Comer FORBES, *   History of Present Illness:    [name] is a 53 y.o. female with a medical history significant for heart failure with slightly decreased ejection fraction, frequent PVCs, hypertension, PAD sepsis bilateral above-the-knee amputations, CKD 3, referred for management of PVCs.     She first came to the attention of cardiology in July 2023 when she saw Dr. Sierras for frequent palpitations and PVCs.  Her ejection fraction at that time was reduced at 40%, and a Zio patch showed a PVC burden of 5.4%.  After starting GDMT, her EF normalized.  In follow-up, the patient continued to notice palpitations, fatigue.  Repeat monitor showed a PVC burden of 24%.  She was admitted in February 2025 with acute on chronic CHF.  Her EF had declined to 30 to 35%, and her PVC burden was noted to be higher at greater than 20%.  She was switched back from propranolol , which was not effective in suppressing her PVCs, to metoprolol  XL. Mexilitine was started.  The mexiletine may have helped some but she continues to have palpitations  She does not seem to have much benefit from atenolol, metoprolol , or propranolol .  Magnesium  taurate did not make much of a difference.  She was taken to the EP for mapping and ablation of PVCs on February 04, 2024.  She had a few PVCs noted as she entered the room, but after getting her on the table, she did not have any further PVCs.  I did not attempt the procedure due to the absence of PVCs.      Today, she is largely at baseline.  She feels euvolemic and compensated but continues to have palpitations though they have improved slightly for mexiletine.   EKGs/Labs/Other Studies  Reviewed Today:    Echocardiogram:  07/16/2022 EF 50 to 55%, mild mitral regurgitation     TTE 05/20/2023     EF 45 to 50%.  Elevated LVEDP.  Small pericardial effusion anterior to the right ventricle localized to the right atrium.  Monitors:  Zio 10d 08/2022 Sinus rhythm heart rate 70-125 beats minute, average 84 23.7% PVC burden, no symptoms reported no other arrhythmia detected   Stress testing:  09/15/2022 No evidence of ischemia. Frequent PVCs noted  Advanced imaging:   Cardiac catherization   EKG:   EKG Interpretation Date/Time:  Tuesday March 14 2024 12:20:52 EDT Ventricular Rate:  66 PR Interval:  196 QRS Duration:  100 QT Interval:  420 QTC Calculation: 440 R Axis:   99  Text Interpretation: Normal sinus rhythm Rightward axis When compared with ECG of 05-Oct-2023 14:18, Premature ventricular complexes are no longer Present T wave inversion no longer evident in Inferior leads Confirmed by Furbish Eulas [phone]) on 03/14/2024 12:41:05 PM     Physical Exam:    VS:  BP 122/62   Pulse 66   Ht 4' 5 (1.346 m)   Wt 128 lb (58.1 kg)   SpO2 98%   BMI 32.04 kg/m     Wt Readings from Last 3 Encounters:  03/14/24 128 lb (58.1 kg)  02/04/24 128 lb (58.1 kg)  12/29/23 128 lb (58.1 kg)     GEN: Well nourished, well developed  in no acute distress CARDIAC: RRR with occasional abnormal beat, no murmurs, rubs, gallops RESPIRATORY:  Normal work of breathing MUSCULOSKELETAL: Status post bilateral AKA    ASSESSMENT & PLAN:    Frequent PVCs Symptomatic with mild palpitations Burden has increased to 25%, with a dominant morphology She also has fatigue, but it is uncertain whether these correlate with the PVCs She is not a great candidate for ablation due to bilateral AKA, though options are limited, and with a burden > 20% and declining EF, we will have to make an attempt to eradicate the PVCs PVC ablation was attempted on February 04, 2024, but she did not have  any PVCs on the table No PVCs on EKG today We will continue to follow.  With waxing waning PVC burden, intervention may be difficult   CHF with recovered ejection fraction EF has improved despite PVCs in the past Decreasing EF has correlated with increased PVC burden Continue to monitor  Tobacco abuse Smoking cessation encouraged  Vascular disease --peripheral, carotid, coronary On aspirin  81, Lipitor 80, clopidogrel  75   Signed, Eulas FORBES Furbish, MD  03/14/2024 12:41 PM    [facility]

## 2024-03-14 ENCOUNTER — Ambulatory Visit: Attending: Cardiovascular Disease | Admitting: Cardiovascular Disease

## 2024-03-14 ENCOUNTER — Encounter: Payer: Self-pay | Admitting: Cardiovascular Disease

## 2024-03-14 VITALS — BP 122/62 | HR 66 | Ht <= 58 in | Wt 128.0 lb

## 2024-03-14 DIAGNOSIS — I493 Ventricular premature depolarization: Secondary | ICD-10-CM | POA: Diagnosis not present

## 2024-03-14 NOTE — Patient Instructions (Signed)
 Medication Instructions:  Your physician recommends that you continue on your current medications as directed. Please refer to the Current Medication list given to you today.  *If you need a refill on your cardiac medications before your next appointment, please call your pharmacy*  Lab Work: None ordered.  If you have labs (blood work) drawn today and your tests are completely normal, you will receive your results only by: MyChart Message (if you have MyChart) OR A paper copy in the mail If you have any lab test that is abnormal or we need to change your treatment, we will call you to review the results.  Testing/Procedures: None ordered.   Follow-Up: At [facility], you and your health needs are our priority.  As part of our continuing mission to provide you with exceptional heart care, our providers are all part of one team.  This team includes your primary Cardiologist (physician) and Advanced Practice Providers or APPs (Physician Assistants and Nurse Practitioners) who all work together to provide you with the care you need, when you need it.  Your next appointment:   12 months in our [place] office with Dr Nancey

## 2024-03-17 ENCOUNTER — Encounter: Payer: Self-pay | Admitting: Radiology

## 2024-04-10 ENCOUNTER — Ambulatory Visit: Attending: Cardiology | Admitting: Cardiology

## 2024-04-10 ENCOUNTER — Encounter: Payer: Self-pay | Admitting: Cardiology

## 2024-04-10 ENCOUNTER — Other Ambulatory Visit (HOSPITAL_BASED_OUTPATIENT_CLINIC_OR_DEPARTMENT_OTHER): Payer: Self-pay

## 2024-04-10 VITALS — BP 140/68 | HR 72 | Ht <= 58 in | Wt 128.0 lb

## 2024-04-10 DIAGNOSIS — I5020 Unspecified systolic (congestive) heart failure: Secondary | ICD-10-CM | POA: Diagnosis not present

## 2024-04-10 MED ORDER — MEXILETINE HCL 200 MG PO CAPS
200.0000 mg | ORAL_CAPSULE | Freq: Two times a day (BID) | ORAL | 4 refills | Status: AC
  Filled 2024-04-10 (×2): qty 60, 30d supply, fill #0

## 2024-04-10 NOTE — Progress Notes (Signed)
    Cardiology Office Note  Date: 04/10/2024   ID: [name], DOB 04/12/1971, MRN [id number]  History of Present Illness: [name] is a 53 y.o. female last seen in June.  Interval visit with Dr. Nancey noted in August.  She is here with her daughter for a follow-up visit.  Reports no major change in status, no angina, no sudden dizziness or syncope.  No increasing weight or obvious fluid retention.  Medications reviewed.  She has not resumed mexiletine since holding it prior to attempted PVC ablation.  Reports compliance with the remainder of her regimen and tolerating readdition of Entresto .  She continues to follow with nephrology, I reviewed her most recent lab work, creatinine 1.9 with GFR 31.  Physical Exam: VS:  BP (!) 140/68   Pulse 72   Ht 4' 5 (1.346 m)   Wt 128 lb (58.1 kg)   BMI 32.04 kg/m , BMI Body mass index is 32.04 kg/m.  Wt Readings from Last 3 Encounters:  04/10/24 128 lb (58.1 kg)  03/14/24 128 lb (58.1 kg)  02/04/24 128 lb (58.1 kg)    General: Patient appears comfortable at rest. HEENT: Conjunctiva and lids normal. Neck: Supple, no elevated JVP or carotid bruits. Lungs: Clear to auscultation, nonlabored breathing at rest. Cardiac: RRR with frequent PVCs, no significant murmur or gallop. Extremities: Status post bilateral AKA.  ECG:  An ECG dated 03/14/2024 was personally reviewed today and demonstrated:  Sinus rhythm.  Labwork: 08/21/2023: B Natriuretic Peptide >4,500.0 08/22/2023: TSH 4.790 08/26/2023: Magnesium  2.1 08/30/2023: ALT 9; AST 21 01/17/2024: BUN 33; Creatinine, Ser 1.90; Hemoglobin 13.4; Platelets 253; Potassium 4.8; Sodium 132   Other Studies Reviewed Today:  No interval cardiac testing for review today.  Assessment and Plan:  1.  HFrEF, LVEF 30 to 35% by follow-up echocardiogram in January.  Myoview  was nonischemic in February of last year although she almost certainly has underlying CAD to some degree.  She does not report any  angina and has had no obvious fluid retention or weight gain.  Plan to continue propranolol  ER 120 mg daily, Jardiance  25 mg daily, Entresto  24/26 mg twice daily, and Lasix  40 mg twice daily.  Plan to recheck echocardiogram for next visit.   2.  Frequent PVCs.  Following with Dr. Nancey.  Attempt at [facility] ablation in July was aborted given very low frequency of PVCs at the time, including with isoproterenol .  Currently on propranolol  ER 120 mg daily.  She did not resume mexiletine 200 mg twice daily following July, PVCs evident on examination today.  Plan to resume at prior dose and continue beta-blocker.   3.  Severe PAD status post bilateral AKA.   4.  Ongoing tobacco abuse.   5.  Mixed hyperlipidemia, LDL 47 in December 2023.  She continues on Crestor  40 mg daily.   6.  Carotid artery disease, asymptomatic.  Follow-up Dopplers in February revealed 1 to 39% RICA stenosis and 40 to 59% LICA stenosis.  Continue antiplatelet regimen and statin.   7.  Mitral valve disease with restricted posterior leaflet associated with moderate to severe eccentric mitral regurgitation by echocardiogram in January.  Left atrium severely dilated.  8.  CKD stage IIIb, creatinine 1.9 with GFR 31.  Disposition:  Follow up 4 months.  Signed, Jayson JUDITHANN Sierras, M.D., F.A.C.C. [facility] at [facility]

## 2024-04-10 NOTE — Patient Instructions (Addendum)
 Medication Instructions:  Your physician has recommended you make the following change in your medication:  Restart mexiletine 200 mg twice daily Continue all other medications as prescribed  Labwork: none  Testing/Procedures: Your physician has requested that you have an echocardiogram in 4 months just before your next visit. Echocardiography is a painless test that uses sound waves to create images of your heart. It provides your doctor with information about the size and shape of your heart and how well your heart's chambers and valves are working. This procedure takes approximately one hour. There are no restrictions for this procedure. Please do NOT wear cologne, perfume, aftershave, or lotions (deodorant is allowed). Please arrive 15 minutes prior to your appointment time.  Please note: We ask at that you not bring children with you during ultrasound (echo/ vascular) testing. Due to room size and safety concerns, children are not allowed in the ultrasound rooms during exams. Our front office staff cannot provide observation of children in our lobby area while testing is being conducted. An adult accompanying a patient to their appointment will only be allowed in the ultrasound room at the discretion of the ultrasound technician under special circumstances. We apologize for any inconvenience.  Follow-Up: Your physician recommends that you schedule a follow-up appointment in: 4 months  Any Other Special Instructions Will Be Listed Below (If Applicable).  If you need a refill on your cardiac medications before your next appointment, please call your pharmacy.

## 2024-04-11 ENCOUNTER — Other Ambulatory Visit (HOSPITAL_BASED_OUTPATIENT_CLINIC_OR_DEPARTMENT_OTHER): Payer: Self-pay

## 2024-04-13 ENCOUNTER — Other Ambulatory Visit (HOSPITAL_BASED_OUTPATIENT_CLINIC_OR_DEPARTMENT_OTHER): Payer: Self-pay

## 2024-04-18 ENCOUNTER — Other Ambulatory Visit: Payer: Self-pay | Admitting: Cardiovascular Disease

## 2024-04-18 DIAGNOSIS — R002 Palpitations: Secondary | ICD-10-CM

## 2024-05-29 ENCOUNTER — Encounter: Payer: Self-pay | Admitting: Radiology

## 2024-06-27 ENCOUNTER — Other Ambulatory Visit: Payer: Self-pay | Admitting: Cardiology

## 2024-06-30 ENCOUNTER — Other Ambulatory Visit: Payer: Self-pay | Admitting: Cardiology

## 2024-07-27 ENCOUNTER — Encounter: Payer: Self-pay | Admitting: Gastroenterology

## 2024-08-07 ENCOUNTER — Ambulatory Visit: Payer: Self-pay | Admitting: Cardiology

## 2024-08-07 ENCOUNTER — Ambulatory Visit

## 2024-08-07 DIAGNOSIS — I5020 Unspecified systolic (congestive) heart failure: Secondary | ICD-10-CM

## 2024-08-07 LAB — ECHOCARDIOGRAM COMPLETE
AR max vel: 1.22 cm2
AV Area VTI: 1.29 cm2
AV Area mean vel: 1.4 cm2
AV Mean grad: 5 mmHg
AV Peak grad: 11.6 mmHg
AV Vena cont: 0.6 cm
Ao pk vel: 1.7 m/s
Area-P 1/2: 9.25 cm2
Calc EF: 30.3 %
MV VTI: 1.11 cm2
P 1/2 time: 258 ms
S' Lateral: 4.8 cm
Single Plane A2C EF: 27.9 %
Single Plane A4C EF: 34.1 %

## 2024-08-15 NOTE — ED Notes (Signed)
 Pt is restless and not following commands.  Dr Maree in to assess.  Family at bedside.  Order place for safety sitter by EDP.

## 2024-08-15 NOTE — ED Notes (Signed)
 [name] informed of room number.

## 2024-08-15 NOTE — ED Triage Notes (Signed)
 Family reports pt has been sick since Thanksgiving with vomiting, cough, headache, difficulty voiding and flu symptoms.

## 2024-08-15 NOTE — H&P (Signed)
 "   History and Physical [facility]   08/15/2024    Patient name: [name] DOB [birth year] MRN#: [id number] PCP: Skillman, Katherine E, [facility] Time: 4:38 PM Primary Care Provider:  Skillman, Katherine E, [facility] Inpatient primary attending provider: Brutus Franco Shank, FNP  _________________________________________________________________________  Admission HPI   Patient admitted on: 08/15/2024  9:15 AM  Patient admitted by: Brutus Franco Shank, FNP   CHIEF COMPLAINT: Decreased urinary output with very little p.o. intake and vomiting since yesterday  Day of admission HPI:  [name]  is a 54 y.o. female with a PMH significant for systolic heart failure (last echo 08/07/2024 showed EF 35 to 40%), diabetes, hypertension, CAD, anxiety, GERD, history of bilateral above-the-knee amputations (1 year apart per sister), 1 pack/day tobacco use. History obtained from patient's sister [name] due to patient's somnolence currently, received 8 mg of morphine  in the ED since admission.  [name] states that patient has been sick for the past month or so with intermittent nausea and vomiting, diarrhea, reduced p.o. intake.  In the last couple of days her urine output has reduced as well as her p.o. intake and she has developed abdominal pain.  Patient lives with her [name] who is her caregiver.  [name] states that she and [name]'s wife as well as other family also live nearby and help.  Today's findings are significant for BNP greater than 70,000, sodium 133, creatinine 2.03, glucose 458, anion gap greater than 15, A1c 10.1, VBG shows pH 7.33, bicarb 16.2, CT chest shows moderate right and mild to moderate left pleural effusions with scattered ground glass opacities representing possible pulmonary edema, moderate cardiomegaly and moderate to severe coronary artery calcifications.  Admit pt for IV diuresis, check limited echo, cardiology consult.  IV antibx for UTI.    Patient  admitted on Home O2? - no Patient on home anticoagulant? -  no Patient admitted with Chronic home foley catheter? - no Foley catheter placed or replaced by another service prior to admission? - no Central Line Status: NONE  Mental Status on Admission: The patient is not  alert and oriented to PERSON The patient is not alert And oriented to TIME The patient is not Alert and oriented to LOCATION  Problem List, Assessment & Plan    ASSESSMENT & PLAN (In order of descending acuity)  Acute on chronic systolic HFrEF Recent echo 08/07/24 showed EF 35-40%; today limited echo shows the same with reduced size of pericardial effusion CT imaging shows moderate right and mild to moderate left pleural effusions BNP > 70,000 Use IV Lasix , strict intake and output, fluid restriction Continue Entresto , Spironolactone, Jardiance  Follows with [facility]ology, Dr. Debera but will consult cardiology while admitted  Pt on the pathway  UTI UA abnormal, urine culture pending Rocephin IV   Diabetes A1C 10.1 Glucose originally 458, down to 200s Use SSI, Lantus  40 units at hs (home dosage), check QID accuchecks and give diabetic diet  Bilateral AKA Due to severe PAD Continue Plavix , ASA  CAD, hyperlipidemia Continue statin  GERD Continue PPI  Abdominal pain, N/V CT abdomen negative Treat symptoms with IV antiemetics, pain medication  Tobacco abuse Smokes 1ppd Nicotine  patch available if needed Counseled  Anxiety Continue Lexapro, xanax  (determine once pt is awake if she needs xanax  on schedule or prn)  Anticoagulation; Lovenox Oxygen; intermittently requiring 2L IVF; none IV antibx; Rocephin  Incidental Findings for outpatient Follow-Up: No significant incidental findings present   Admit pt for IV diuresis, check limited  echo, cardiology consult.  IV antibx for UTI.  Discussed admission with the pt's sister, friend at bedside.  Pt is unable to provide information as she is  somnolent and altered.  Discussed the admission with Dr. Heath attending.    ADDITIONAL NON-ACUTE FINDINGS, OBSERVATIONS, FAMILY DISCUSSIONS, ETC. (When present):  Physical exam General; ill-appearing 54 year old female laying in the bed, somnolent but responds and follows commands Cardiovascular; regular rhythm, rate 70s Pulmonary; mild scattered crackles, normal breathing effort, 94% on room air currently, required oxygen intermittently when she was in the ED Abdomen; soft, nontender, nondistended, bowel sounds present all 4 quadrants Extremities; bilateral BKA, no edema noted Neuro; somnolent currently, received morphine  downstairs in the ED, awakens partially and follows commands but does not answer questions appropriately  DVT Prophylaxis Ordered: SQ Enoxaparin __________________________________________________________________________  Temp:  [36.5 C (97.7 F)-36.9 C (98.4 F)] 36.9 C (98.4 F) Pulse:  [78-101] 78 SpO2 Pulse:  [92-106] 106 Resp:  [16-19] 19 BP: (115-185)/(63-102) 138/80 SpO2:  [94 %-98 %] 96 % Body mass index is 26.56 kg/m. Intake/Output last 3 shifts: No intake/output data recorded.  Consults Requested  IP CONSULT TO HOSPITALIST     In hospital Nutrition: Nutrition Therapy Consistent Carb; Consistent Carb 60/60/60 (4/4/4); Fluid 1800 ml    An advanced care planning discussion was  had with patient and/or patient's decisions maker (documented separately).  CODE STATUS :                    Full Code   Given this patient's known comorbid illnesses and condition Present on Admission, plan of care includes acute interventions of IV diuresis, oxygen if needed, IV antibx as noted in the Problem List, Assessment & Plan noted above.  I fully expect, with this information in hand, that this patient will require at least two medically necessary midnights of hospital care prior to a safe discharge.   The patient's hospital stay is complicated by the above  clinically significant conditions, as documented in Assessment and Plan, which are present on admission and requiring additional evaluation and treatment or having a significant effect of this patient's care.   __________________________________________________________  Not on File   Past Medical History[1]  Past Surgical History[2]   Family History[3]       Current Medications[4]  REFER TO EPIC FOR FULL LIST OF CURRENT MEDICATIONS ORDERED ON ADMISSION. THESE ORDERS APPEAR ONLY WHEN RELEASED, WHICH MAY HAPPEN AFTER ADMISSION ONCE PATIENT IS TRANSFERRED FROM THE ED.  Allergies  Allergies[5]  Imaging  Echocardiogram Follow Up/Limited Echo Result Date: 08/15/2024 Patient Info Name:     [name] Age:     53 years DOB:     1971/01/26 Gender:     Female MRN:     [id number] Accession #:     [id number] [facility] Account #:     [id number] Ht:     152 cm Wt:     62 kg BSA:     1.63 m2 BP:     145 /     86 mmHg HR:     94 bpm Exam Date:     08/15/2024 3:05 PM Admit Date:     08/15/2024 Exam Type:     ECHOCARDIOGRAM FOLLOW UP/LIMITED ECHO Technical Quality:     Good Staff Sonographer:     Mliss Gate Supervising Physician:     Earla Maude Currier MD Ordering Physician:     Brutus Bryant Pace Study Info Indications      - BNP >  70000 Procedure(s)   Limited 2D transthoracic echocardiogram is performed. Summary   1. The left ventricle is moderately dilated in size with mildly increased wall thickness.   2. The left ventricular systolic function is moderately decreased, LVEF is visually estimated at 35-40%.   3. The left atrium is dilated in size.   4. The right ventricle is mildly dilated in size, with mildly reduced systolic function.   5. The right atrium is dilated in size.   6. Valve function was not adequately assessed on limited echo. Left Ventricle   The left ventricle is moderately dilated in size with mildly increased wall thickness. The left ventricular systolic function is moderately  decreased, LVEF is visually estimated at 35-40%. Left ventricular diastolic function cannot be accurately assessed. Right Ventricle   The right ventricle is mildly dilated in size, with mildly reduced systolic function. Left Atrium   The left atrium is dilated in size. Right Atrium   The right atrium is dilated in size. Aortic Valve   The aortic valve is not well visualized. Mitral Valve   The mitral valve leaflets are normal with normal leaflet mobility. Tricuspid Valve   The tricuspid valve leaflets are normal, with normal leaflet mobility. Aorta   The aorta is normal in size in the visualized segments. Inferior Vena Cava   The IVC is not well visualized precluding the ability to accurate assess right atrial pressure. Pericardium/Pleural   There is no pericardial effusion. Ventricles ---------------------------------------------------------------------- Name                                 Value        Normal ---------------------------------------------------------------------- LV Dimensions 2D/MM ----------------------------------------------------------------------  IVS Diastolic Thickness (2D)                                1.1 cm       0.6-0.9 LVID Diastole (2D)                  5.9 cm       3.8-5.2  LVPW Diastolic Thickness (2D)                                1.1 cm       0.6-0.9 LVID Systole (2D)                   4.6 cm       2.2-3.5 LV Mass Index (2D Cubed)          166 g/m2         43-95  Relative Wall Thickness (2D)                                  0.37        <=0.42 LV Function ---------------------------------------------------------------------- LV EF (4C MOD)                        34 %                LV Diastolic Volume Index (BP MOD)                        61.8 ml/m2  29.0-61.0 LV EF (BP MOD)                        30 %         54-74 RV Dimensions 2D/MM ----------------------------------------------------------------------  RV Basal Diastolic Dimension                           4.3 cm        2.5-4.1 Aorta ---------------------------------------------------------------------- Name                                 Value        Normal ---------------------------------------------------------------------- Ascending Aorta ---------------------------------------------------------------------- Ao Root Diameter (2D)               2.2 cm               Ao Root Diam Index (2D)          1.3 cm/m2 Report Signatures Finalized by Earla Maude Currier  MD on 08/15/2024 04:23 PM  CT chest without contrast Result Date: 08/15/2024 Exam: CT of the Chest without Contrast  History: Right flank pain. Chronic kidney disease. Diabetes. Last insulin  taken yesterday morning.  Technique: Contiguous axial images were obtained through the chest without intravenous contrast. Multiplanar and maximum intensity projection axial reformats were obtained. AEC (automated exposure control) and/or manual techniques such as size-specific kV and mAs are employed where appropriate to reduce radiation exposure for all CT exams.  Comparison: AP chest 08/15/2024, chest 2 views 08/20/2023, CTA chest 02/09/2012  Findings: LUNGS: Mild to moderate motion artifact. The central airways are patent. Moderate right and mild to moderate left pleural effusions with associated mild submental atelectasis. Segmental atelectasis within the inferior medial right middle lobe. Bilateral scattered groundglass opacities. No pneumothorax.  HEART: Heart size is moderately enlarged, mildly increased from 02/09/2012. Trace pericardial fluid. Moderate to severe coronary artery calcifications.  GREAT VESSELS: The main pulm artery measures up to 3.3 cm, newly enlarged as can be seen with chronic pulmonary arterial hypertension. No thoracic aortic aneurysm. Mild to moderate atherosclerotic calcifications.  MEDIASTINUM: No lymphadenopathy.  BONES: Mild multilevel degenerative disc changes of the thoracic spine, greatest at the anterior, mid to superior thoracic spine.  SOFT  TISSUES: Mild subcutaneous fat edema diffusely, likely mild anasarca.  UPPER ABDOMEN: Limited view of the upper abdomen is unremarkable.    1.    Moderate right and mild to moderate left pleural effusions with associated mild atelectasis. 2.    Bilateral scattered groundglass opacities, possibly subsegmental atelectasis versus mild pulmonary edema. 3.    Moderate cardiomegaly, mildly increased compared to 2013. Moderate to severe coronary artery calcifications. 4.    Mild enlargement of the main pulmonary artery as can be seen with chronic pulmonary arterial hypertension.   Signed (Electronic Signature): 08/15/2024 2:13 PM Signed By: Tanda Lyons  CT Abdomen Pelvis Wo Contrast Result Date: 08/15/2024 Exam: CT of the Abdomen and Pelvis without Contrast  History: 54 year old female with right flank pain  Technique: Routine CT of the abdomen and pelvis without IV contrast. AEC (automated exposure control) and/or manual techniques such as size-specific kV and mAs are employed where appropriate to reduce radiation exposure for all CT exams.  Comparison: 08/20/2023 CT  Findings: LOWER CHEST: Moderate right and small left pleural effusions. Cardiomegaly. Partially visualized groundglass in the lower lobes with passive atelectasis.  HEPATOBILIARY: Normal  SPLEEN: Normal.  PANCREAS: Normal.  ADRENALS: Normal.  KIDNEYS: No hydronephrosis. Right renal atrophy.  GASTROINTESTINAL: No abnormally dilated or thickened loops of bowel. Small volume ascites. No pneumoperitoneum.  PELVIC ORGANS: Small volume free fluid in the pelvis.  LYMPH NODES: No lymphadenopathy.  VASCULATURE: Calcified atherosclerosis, normal caliber abdominal aorta. Right femoral bypass graft is poorly evaluated without contrast.  SOFT TISSUES: Small fat-containing right groin hernia.  BONES: No aggressive lesion. No acute abnormality.    1.    No acute findings in the abdomen or pelvis. 2.    Cardiomegaly with fluid overload. Moderate right and small left  pleural effusions with small volume ascites.    Signed (Electronic Signature): 08/15/2024 2:03 PM Signed By: Carlin Essex, MD  XR Chest Portable Result Date: 08/15/2024 Exam:  Portable Chest  History: Cough  Technique:  Single frontal view.  Comparison: Chest radiograph dated 08/20/2023  Findings:    Diffuse, mild interstitial opacities.  Bibasilar opacities. Small bilateral pleural effusions. No pneumothorax.  The cardiomediastinal silhouette is unchanged.     *    Probable mild pulmonary edema with small bilateral pleural effusions. *    Bibasilar opacities, atelectasis versus infection.    Signed (Electronic Signature): 08/15/2024 9:41 AM Signed By: Reyes Going, MD   Lab Results   Recent Labs    08/15/24 0956  WBC 7.6  HGB 12.4  HCT 36.5  PLT 210   Recent Labs    08/15/24 0956  NA 133*  K 5.0  CL 101  CO2 17.4*  BUN 25*  CREATININE 2.03*  GLU 458*  CALCIUM  8.6  ALBUMIN 3.5  PROT 7.2  BILITOT 0.8  AST 14*  ALT 22  ALKPHOS 147*  MG 1.9   No results for input(s): CKTOTAL, CKMB, PCTCKMB, TROPONINI, EDTPNI, BNP, INR, LABPROT, APTT, DDIMER in the last 72 hours. Recent Labs    08/15/24 1014  WBCUA 24*  NITRITE Negative  LEUKOCYTESUR Trace*  BACTERIA Rare*  RBCUA 10*  BLOODU Moderate*  GLUCOSEU >1000 mg/dL*  PROTEINUA 399 mg/dL*  KETONESU Trace*   Recent Labs    08/15/24 1014  OPIAU Negative  BENZU Negative  AMPHU Negative  COCAU Negative  CANNAU Negative  BARBU Negative   No results for input(s): PREGTESTUR, PREGPOC in the last 72 hours. Recent Labs    08/15/24 1022  A1C 10.1*   No results for input(s): O2SOUR, FIO2ART, PHART, PCO2ART, PO2ART, HCO3ART, O2SATART, BEART in the last 72 hours. Pending Labs     Order Current Status   Beta Hydroxybutyrate In process   Urine Culture In process       Home Medications   Prior to Admission medications  Medication Dose, Route, Frequency  JARDIANCE  25 mg tablet  25 mg, Daily (standard)  spironolactone (ALDACTONE) 25 MG tablet 0.5 tablets, Daily (standard)  ALPRAZolam  (XANAX ) 0.5 MG tablet 0.5 mg, 3 times a day (standard)  amlodipine  (NORVASC ) 2.5 MG tablet 2.5 mg, Daily (standard)  aspirin  (ECOTRIN) 81 MG tablet 81 mg, Daily (standard)  clopidogrel  (PLAVIX ) 75 mg tablet 75 mg, Daily (standard)  DULoxetine  (CYMBALTA ) 30 MG capsule 30 mg, Daily (standard)  escitalopram oxalate (LEXAPRO) 20 MG tablet 1 tablet, Daily (standard) Patient not taking: Reported on 08/20/2023  furosemide  (LASIX ) 20 MG tablet 20 mg, Oral, Daily (standard)  gabapentin  (NEURONTIN ) 300 MG capsule 300 mg, Oral, 3 times a day (standard) Patient taking differently: Take 1 capsule (300 mg total) by mouth in the morning.  insulin  glargine (BASAGLAR , LANTUS ) 100 unit/mL (3 mL)  injection pen 10 Units, Subcutaneous, 2 times a day (standard)  OZEMPIC 1 mg/dose (4 mg/3 mL) PnIj injection 4 mg, Every 7 days  pantoprazole  (PROTONIX ) 40 MG tablet 40 mg, Daily (standard)  propranolol  (INDERAL  LA) 120 mg 24 hr capsule 1 capsule, Daily (standard)  rosuvastatin  (CRESTOR ) 40 MG tablet 40 mg, Oral, Daily (standard)  sacubitril -valsartan  (ENTRESTO ) 24-26 mg tablet 1 tablet, Oral, 2 times a day (standard)  topiramate  (TOPAMAX ) 50 MG tablet 50 mg, 2 times a day (standard)  VENTOLIN  HFA 90 mcg/actuation inhaler 2 puffs, Every 4 hours PRN   Brutus FORBES Shank, FNP Hospitalist, [facility] 08/15/2024, 4:38 PM      [1] Past Medical History: Diagnosis Date   CHF (congestive heart failure) (CMS-HCC)    Nonischemic cardiomyopathy    (CMS-HCC)    Premature ventricular contractions    26% by [employer]   Renal disorder    Stage 3 CKD   Tobacco use   [2] Past Surgical History: Procedure Laterality Date   APPENDECTOMY     Bilat leg amputation     CESAREAN SECTION     CHOLECYSTECTOMY    [3] History reviewed. No pertinent family history. [4]  Current Facility-Administered Medications:     acetaminophen  (TYLENOL ) tablet 650 mg, 650 mg, Oral, Q4H PRN, Pace, Hagan Eggleston, FNP   albuterol  2.5 mg /3 mL (0.083 %) nebulizer solution 2.5 mg, 2.5 mg, Nebulization, Q4H PRN, Pace, Hagan Eggleston, FNP   ALPRAZolam  (XANAX ) tablet 0.5 mg, 0.5 mg, Oral, TID, Pace, Hagan Eggleston, FNP   aspirin  (ECOTRIN) tablet 81 mg, 81 mg, Oral, Daily, Pace, Hagan Eggleston, FNP   atorvastatin  (LIPITOR) tablet 80 mg, 80 mg, Oral, Nightly, Pace, Hagan Eggleston, FNP   calcium  carbonate (TUMS) chewable tablet 400 mg elem calcium , 400 mg elem calcium , Oral, Daily PRN, Pace, Hagan Eggleston, FNP   clopidogrel  (PLAVIX ) tablet 75 mg, 75 mg, Oral, Daily, Pace, Hagan Eggleston, FNP   dextrose  (GLUTOSE) 40 % gel 15 g of dextrose , 15 g of dextrose , Oral, Q10 Min PRN, Pace, Hagan Eggleston, FNP   dextrose  50 % in water  (D50W) 50 % solution 12.5 g, 12.5 g, Intravenous, Q15 Min PRN, Pace, Brutus Bryant, FNP   DULoxetine  (CYMBALTA ) DR capsule 30 mg, 30 mg, Oral, Daily, Pace, Hagan Eggleston, FNP   empagliflozin  (JARDIANCE ) tablet 25 mg, 25 mg, Oral, Daily, Pace, Hagan Eggleston, FNP   enoxaparin (LOVENOX) syringe 30 mg, 30 mg, Subcutaneous, Q24H, Pace, Hagan Eggleston, FNP   escitalopram oxalate (LEXAPRO) tablet 20 mg, 20 mg, Oral, Daily, Pace, Hagan Eggleston, FNP   [START ON 08/16/2024] furosemide  (LASIX ) injection 40 mg, 40 mg, Intravenous, Daily, Pace, Hagan Eggleston, FNP   [Provider Hold] furosemide  (LASIX ) tablet 20 mg, 20 mg, Oral, Daily, Pace, Hagan Eggleston, FNP   gabapentin  (NEURONTIN ) capsule 300 mg, 300 mg, Oral, Daily, Pace, Hagan Eggleston, FNP   glucagon injection 1 mg, 1 mg, Intramuscular, Once PRN, Pace, Hagan Eggleston, FNP   guaiFENesin  (ROBITUSSIN) oral syrup, 200 mg, Oral, Q4H PRN, Pace, Hagan Eggleston, FNP   insulin  glargine (LANTUS ) injection BASAL 40 Units, 40 Units, Subcutaneous, Nightly, Pace, Brutus Bryant, FNP   insulin  lispro (HumaLOG) injection CORRECTIONAL 0-20  Units, 0-20 Units, Subcutaneous, ACHS, Pace, Hagan Eggleston, FNP   melatonin tablet 3 mg, 3 mg, Oral, Nightly PRN, Pace, Hagan Eggleston, FNP   nicotine  (NICODERM CQ ) 21 mg/24 hr patch 1 patch, 1 patch, Transdermal, Daily PRN, Pace, Hagan Eggleston, FNP   ondansetron  (ZOFRAN ) injection 4 mg, 4 mg, Intravenous, Q8H PRN **OR** ondansetron  (  ZOFRAN ) injection 8 mg, 8 mg, Intravenous, Q8H PRN, Pace, Hagan Eggleston, FNP   pantoprazole  (Protonix ) EC tablet 40 mg, 40 mg, Oral, Daily, Pace, Hagan Eggleston, FNP   propranolol  (INDERAL ) tablet 60 mg, 60 mg, Oral, BID, Pace, Hagan Eggleston, FNP   sacubitril -valsartan  (ENTRESTO ) 24-26 mg tablet 1 tablet, 1 tablet, Oral, BID, Pace, Hagan Eggleston, FNP   [Provider Hold] spironolactone (ALDACTONE) split tablet 12.5 mg, 12.5 mg, Oral, Daily, Pace, Hagan Eggleston, FNP   topiramate  (Topamax ) tablet 50 mg, 50 mg, Oral, BID, Pace, Hagan Eggleston, FNP [5] Not on File "

## 2024-08-15 NOTE — Nursing Note (Signed)
 Per the pharmacist initiated renal dose adjustment policy, enoxaparin 40 mg Q24H for prophylaxis was adjusted to 30 mg due to CrC < 30 mL/min.

## 2024-08-15 NOTE — ED Notes (Signed)
 CT called to inform med given.  Well attempt CT again.

## 2024-08-15 NOTE — ACP (Advance Care Planning) (Signed)
 ADVANCE CARE PLANNING NOTE  Discussion Date:  August 15, 2024  Patient has decisional capacity:  Not currently  Patient has selected a Health Care Decision-Maker if loses capacity: Yes  Health Care Decision Maker as of 08/15/2024 sister [name] [phone]   Discussion Participants: Pt's sister [name] and myself  Communication of Medical Status/Prognosis:  Yes  Communication of Treatment Goals/Options:  Discussed treatment plan, IV diuresis, treatment of UTI, code status  Treatment Decisions:  Pt's sister agrees with the treatment plan, full code     I spent 5 minutes providing voluntary advance care planning services for this patient.

## 2024-08-15 NOTE — ED Notes (Signed)
 After 2nd dose pt is moving less.  O2 was on for about 10 minutes, currently off and Sats are 95%.

## 2024-08-16 NOTE — Progress Notes (Signed)
 " PROGRESS NOTE [facility] 08/16/2024    Patient name: [name] DOB November 06, 1970 MRN#: [id number] PCP: Skillman, Katherine E, [facility] Time: 2:25 PM Primary Care Provider:  Skillman, Katherine E, [facility] primary attending provider: Chiquita Earnie Richards, MD   Hospital Course: 08/15/24 Admitted for volume overload, elevated BNP 70,000.  Pleural effusions and mild pulmonary edema on imaging.  Blood sugar 400s.  _____________________________________  CHIEF COMPLAINT: nausea, vomiting, abdominal pain, low urine output  Subjective: Patient presented yesterday with nausea, vomiting, abdominal pain, decreasing urine output.  Initial presentation consistent with acute heart failure with elevated BNP, pulmonary edema on chest imaging.  Patient reportedly normally conversant yesterday morning but became less responsive and nonverbal throughout the day, still alert and able to make eye contact.  This morning nurse noted patient to be completely unresponsive and not moving left upper extremity even to painful stimulus.  A code stroke was called.  CT head shows age-indeterminate infarct of right caudate.  She is not a candidate for tPA given out of time window. Vital signs stable.  Patient was moved to the ICU for closer monitoring.  Assessment and Plan:  # Suspect Acute ischemic CVA, right caudate Patient with decreasing verbal responsiveness on presentation and developed unresponsiveness and left arm hemiparesis on day 2. CTH shows focal hypodensity in the right caudate body and adjacent white matter concerning for infarct, age-indeterminate. Recent echo showed no clot, bubble study not done. Lower suspicion for seizure, CK normal. LDL 119 on statin. TSH 5.105 non-contributory. Urine culture with mixed flora. Carotid US  bilateral, no critical stenosis. MRA head no intracranial vascular lesion.  - transfer to ICU - Code stroke was called, appreciate neurology recommendations, started keppra  1g BID, may need to transfer for EEG if MRI does not show acute stroke - MRI brain to further evaluate caudate lesion, discussed with family risk of aspiration laying flat for an extended time given she is already on CVA treatment, and they would like to defer today and see if she gets better  - Obtaining MRA of the head and ultrasound of carotid arteries.  Patient's renal function prohibits the use of IV contrast.  Per discussion with radiology department, MRA of the neck without contrast would be non-diagnostic with our equipment. - continue apirin, plavix , statin when taking PO - permissive HTN up to 220/120 for antoher 24h - NPO, will need SLP, PT, OT when able to participate - follow-up B12, blood cultures  # AHRF # Acute on chronic HFrEF # R>L pleural effusions Presented with n/v (gut edema?), BNP 70K, pulmonary edema. EF 35-40%, pericardial effusion resolved on limited repeat echo.  Recent echo 1/12 with global hypokinesis, RV dysfunction, small pericardial effusion, moderate MR, moderate AAS, moderate AR. Thoracentesis deferred on presentation as not hypoxic.  - O2 as needed - continue lasix  IV - strict Ios - continue entresto , spironolactone after permissive HTN, propanolol, jardiance  - follows [facility] cardiology  # Elevated troponin Up to 254. Demand ischemia suspected due to above per cardiology.  # PVCs - will resume mexiletine per cardiology - propanolol  - follows with EP at [facility]  # Abnormal urinalysis  # CKD3b Baseline Cr appears 1.4-2.2. - avoid nephrotoxic meds  - monitor urine output closely   # CAD # Severe PAD # Bilateral AKA - aspirin , plavix , statin   # Diabetes, uncontrolled Hba1c 10.1 - home lantus , POC glucose, SSI  # Toabcco dependence - advised cessation   DVT ppx - lovenox  CODE status Full  per discussion with family at bedside.   _____________________________________  Temp:  [36.3 C (97.4 F)-37.7 C (99.8 F)] 37.7 C (99.8 F) Pulse:   [40-108] 83 SpO2 Pulse:  [106] 106 Resp:  [11-44] 44 BP: (115-177)/(63-119) 159/88 FiO2 (%):  [28 %] 28 % SpO2:  [80 %-98 %] 97 % Body mass index is 26.56 kg/m. Intake/Output last 3 shifts: I/O last 3 completed shifts: In: 166.7 [IV Piggyback:166.7] Out: -   Physical Exam  Constitutional: She appears acutely ill.  Cardiovascular: Normal rate and regular rhythm.  Pulmonary/Chest: Breath sounds normal.  Abdominal: Soft. There is no abdominal tenderness.  Musculoskeletal:     Cervical back: Neck supple.     Comments: Bilateral AKA  Neurological:  Patient is obtunded with altered breathing pattern, unable to follow commands or track, moves right upper extremity spontaneously, left upper extremity not responding to pain  Skin: Skin is warm and dry.     Consults Requested  IP CONSULT TO HOSPITALIST IP CONSULT TO NUTRITION SERVICES IP CONSULT TO CARDIOLOGY     In hospital Nutrition: NPO No Exceptions; Medically necessary    An advanced care planning discussion was had with patient and/or patient's decisions maker (documented separately).  CODE STATUS :                    Full Code   Discharge estimated within 3 days. Anticipated disposition:  To Skilled Nursing Facility ______________________________________________________________  Current Medications[1] ________________________________________________________________  Not on File   Past Medical History[2]  Past Surgical History[3]   Family History[4]        Imaging  CT Head Wo Contrast Result Date: 08/16/2024 Exam:  CT Head without Contrast  History: Not responsive, altered mental status  Technique: Routine brain CT without IV contrast. AEC (automated exposure control) and/or manual techniques such as size-specific kV and mAs are employed where appropriate to reduce radiation exposure for all CT exams.  Comparison:  None.  Findings: Ventricles and sulci are appropriate for the patient's age. There is focal  hypodensity in the right caudate body and adjacent white matter. This is ill-defined. Significant motion artifact is noted. Focal hypodensity in the left thalamus appears more chronic. No mass effect, midline shift, or extra-axial fluid collection. No intracranial hemorrhage. Basal cisterns are patent. The globes are intact bilaterally. Paranasal sinuses and mastoid air cells are clear.    Focal hypodensity in the right caudate body and adjacent white matter, concerning for infarct, technically age-indeterminate. Consider MRI for further assessment.  Findings were communicated to Augustin Cecil, RN, at 10:10 a.m. on 08/16/2024.  Signed (Electronic Signature): 08/16/2024 10:13 AM Signed By: Dorothyann Jointer, MD  ECG 12 Lead Result Date: 08/16/2024 Normal sinus rhythm Possible Left atrial enlargement Left posterior fascicular block Nonspecific T wave abnormality Abnormal ECG When compared with ECG of 15-Aug-2024 18:57, Vent. rate has decreased by  38 bpm Nonspecific T wave abnormality now evident in Anterior leads  ECG 12 Lead Result Date: 08/15/2024  Poor data quality, interpretation may be adversely affected  Age and gender specific ECG analysis  Sinus tachycardia Possible Left atrial enlargement Rightward axis ST elevation, consider lateral injury or acute infarct ** ** ACUTE MI / STEMI ** ** Abnormal ECG When compared with ECG of 20-Aug-2023 23:28, premature ventricular complexes are no longer present Minimal criteria for Anterior infarct are no longer present T wave inversion now evident in Inferior leads Nonspecific T wave abnormality, improved in Anterolateral leads Best Test due to patient restlessness  Echocardiogram Follow  Up/Limited Echo Result Date: 08/15/2024 Patient Info Name:     Teria Khachatryan Age:     53 years DOB:     25-Feb-1971 Gender:     Female MRN:     [id number] Accession #:     [id number] [facility] Account #:     [id number] Ht:     152 cm Wt:     62 kg BSA:     1.63 m2 BP:     145 /      86 mmHg HR:     94 bpm Exam Date:     08/15/2024 3:05 PM Admit Date:     08/15/2024 Exam Type:     ECHOCARDIOGRAM FOLLOW UP/LIMITED ECHO Technical Quality:     Good Staff Sonographer:     Mliss Gate Supervising Physician:     Earla Maude Currier MD Ordering Physician:     Brutus Bryant Pace Study Info Indications      - BNP > 70000 Procedure(s)   Limited 2D transthoracic echocardiogram is performed. Summary   1. The left ventricle is moderately dilated in size with mildly increased wall thickness.   2. The left ventricular systolic function is moderately decreased, LVEF is visually estimated at 35-40%.   3. The left atrium is dilated in size.   4. The right ventricle is mildly dilated in size, with mildly reduced systolic function.   5. The right atrium is dilated in size.   6. Valve function was not adequately assessed on limited echo. Left Ventricle   The left ventricle is moderately dilated in size with mildly increased wall thickness. The left ventricular systolic function is moderately decreased, LVEF is visually estimated at 35-40%. Left ventricular diastolic function cannot be accurately assessed. Right Ventricle   The right ventricle is mildly dilated in size, with mildly reduced systolic function. Left Atrium   The left atrium is dilated in size. Right Atrium   The right atrium is dilated in size. Aortic Valve   The aortic valve is not well visualized. Mitral Valve   The mitral valve leaflets are normal with normal leaflet mobility. Tricuspid Valve   The tricuspid valve leaflets are normal, with normal leaflet mobility. Aorta   The aorta is normal in size in the visualized segments. Inferior Vena Cava   The IVC is not well visualized precluding the ability to accurate assess right atrial pressure. Pericardium/Pleural   There is no pericardial effusion. Ventricles ---------------------------------------------------------------------- Name                                 Value        Normal  ---------------------------------------------------------------------- LV Dimensions 2D/MM ----------------------------------------------------------------------  IVS Diastolic Thickness (2D)                                1.1 cm       0.6-0.9 LVID Diastole (2D)                  5.9 cm       3.8-5.2  LVPW Diastolic Thickness (2D)                                1.1 cm       0.6-0.9 LVID Systole (2D)  4.6 cm       2.2-3.5 LV Mass Index (2D Cubed)          166 g/m2         43-95  Relative Wall Thickness (2D)                                  0.37        <=0.42 LV Function ---------------------------------------------------------------------- LV EF (4C MOD)                        34 %                LV Diastolic Volume Index (BP MOD)                        61.8 ml/m2     29.0-61.0 LV EF (BP MOD)                        30 %         54-74 RV Dimensions 2D/MM ----------------------------------------------------------------------  RV Basal Diastolic Dimension                           4.3 cm       2.5-4.1 Aorta ---------------------------------------------------------------------- Name                                 Value        Normal ---------------------------------------------------------------------- Ascending Aorta ---------------------------------------------------------------------- Ao Root Diameter (2D)               2.2 cm               Ao Root Diam Index (2D)          1.3 cm/m2 Report Signatures Finalized by Earla Maude Currier  MD on 08/15/2024 04:23 PM  CT chest without contrast Result Date: 08/15/2024 Exam: CT of the Chest without Contrast  History: Right flank pain. Chronic kidney disease. Diabetes. Last insulin  taken yesterday morning.  Technique: Contiguous axial images were obtained through the chest without intravenous contrast. Multiplanar and maximum intensity projection axial reformats were obtained. AEC (automated exposure control) and/or manual techniques such as size-specific kV  and mAs are employed where appropriate to reduce radiation exposure for all CT exams.  Comparison: AP chest 08/15/2024, chest 2 views 08/20/2023, CTA chest 02/09/2012  Findings: LUNGS: Mild to moderate motion artifact. The central airways are patent. Moderate right and mild to moderate left pleural effusions with associated mild submental atelectasis. Segmental atelectasis within the inferior medial right middle lobe. Bilateral scattered groundglass opacities. No pneumothorax.  HEART: Heart size is moderately enlarged, mildly increased from 02/09/2012. Trace pericardial fluid. Moderate to severe coronary artery calcifications.  GREAT VESSELS: The main pulm artery measures up to 3.3 cm, newly enlarged as can be seen with chronic pulmonary arterial hypertension. No thoracic aortic aneurysm. Mild to moderate atherosclerotic calcifications.  MEDIASTINUM: No lymphadenopathy.  BONES: Mild multilevel degenerative disc changes of the thoracic spine, greatest at the anterior, mid to superior thoracic spine.  SOFT TISSUES: Mild subcutaneous fat edema diffusely, likely mild anasarca.  UPPER ABDOMEN: Limited view of the upper abdomen is unremarkable.    1.    Moderate right and mild to moderate left  pleural effusions with associated mild atelectasis. 2.    Bilateral scattered groundglass opacities, possibly subsegmental atelectasis versus mild pulmonary edema. 3.    Moderate cardiomegaly, mildly increased compared to 2013. Moderate to severe coronary artery calcifications. 4.    Mild enlargement of the main pulmonary artery as can be seen with chronic pulmonary arterial hypertension.   Signed (Electronic Signature): 08/15/2024 2:13 PM Signed By: Tanda Lyons  CT Abdomen Pelvis Wo Contrast Result Date: 08/15/2024 Exam: CT of the Abdomen and Pelvis without Contrast  History: 54 year old female with right flank pain  Technique: Routine CT of the abdomen and pelvis without IV contrast. AEC (automated exposure control) and/or manual  techniques such as size-specific kV and mAs are employed where appropriate to reduce radiation exposure for all CT exams.  Comparison: 08/20/2023 CT  Findings: LOWER CHEST: Moderate right and small left pleural effusions. Cardiomegaly. Partially visualized groundglass in the lower lobes with passive atelectasis.  HEPATOBILIARY: Normal  SPLEEN: Normal.  PANCREAS: Normal.  ADRENALS: Normal.  KIDNEYS: No hydronephrosis. Right renal atrophy.  GASTROINTESTINAL: No abnormally dilated or thickened loops of bowel. Small volume ascites. No pneumoperitoneum.  PELVIC ORGANS: Small volume free fluid in the pelvis.  LYMPH NODES: No lymphadenopathy.  VASCULATURE: Calcified atherosclerosis, normal caliber abdominal aorta. Right femoral bypass graft is poorly evaluated without contrast.  SOFT TISSUES: Small fat-containing right groin hernia.  BONES: No aggressive lesion. No acute abnormality.    1.    No acute findings in the abdomen or pelvis. 2.    Cardiomegaly with fluid overload. Moderate right and small left pleural effusions with small volume ascites.    Signed (Electronic Signature): 08/15/2024 2:03 PM Signed By: Carlin Essex, MD  XR Chest Portable Result Date: 08/15/2024 Exam:  Portable Chest  History: Cough  Technique:  Single frontal view.  Comparison: Chest radiograph dated 08/20/2023  Findings:    Diffuse, mild interstitial opacities.  Bibasilar opacities. Small bilateral pleural effusions. No pneumothorax.  The cardiomediastinal silhouette is unchanged.     *    Probable mild pulmonary edema with small bilateral pleural effusions. *    Bibasilar opacities, atelectasis versus infection.    Signed (Electronic Signature): 08/15/2024 9:41 AM Signed By: Reyes Going, MD   Lab Results   Recent Labs    08/16/24 0427  WBC 13.4*  HGB 11.7  HCT 36.3  PLT 253   Recent Labs    08/16/24 0427 08/16/24 0854 08/16/24 1330  NA 131*  --   --   K 4.7  --   --   CL 104  --   --   CO2 20.4*  --   --   BUN 31*   --   --   CREATININE 2.15*  --   --   GLU 67*  --   --   CALCIUM  8.8  --   --   ALBUMIN 3.4*  --   --   PROT 7.0  --   --   BILITOT 0.8  --   --   AST 17  --   --   ALT 24  --   --   ALKPHOS 146*  --   --   MG 1.9  --   --   TSH  --  5.105*  --   AMMONIA  --   --  10*   Recent Labs    08/16/24 0427 08/16/24 0854  CKTOTAL  --  81.0  TROPONINI 254*  --    Recent Labs  08/15/24 1014  WBCUA 24*  NITRITE Negative  LEUKOCYTESUR Trace*  BACTERIA Rare*  RBCUA 10*  BLOODU Moderate*  GLUCOSEU >1000 mg/dL*  PROTEINUA 399 mg/dL*  KETONESU Trace*   Recent Labs    08/15/24 1014  OPIAU Negative  BENZU Negative  AMPHU Negative  COCAU Negative  CANNAU Negative  BARBU Negative   No results for input(s): PREGTESTUR, PREGPOC in the last 72 hours. Recent Labs    08/15/24 1022 08/16/24 0854  A1C 10.1*  --   CHOL  --  166  LDL  --  119*  HDL  --  36*  TRIG  --  66   Recent Labs    08/16/24 1017  PHART 7.47*  PCO2ART 19.5*  PO2ART 80*  HCO3ART 14.1*  O2SATART 96.4  BEART -7.4*   Pending Labs     Order Current Status   Blood Culture In process   Blood Culture In process   Lipoprotein a (LP(a)) In process   Vitamin B12 Level In process       Chiquita CHRISTELLA Richards, MD Hospitalist, [facility] 08/16/2024, 2:25 PM       [1]  Current Facility-Administered Medications:    acetaminophen  (TYLENOL ) tablet 650 mg, 650 mg, Oral, Q4H PRN, Pace, Hagan Eggleston, FNP   albuterol  2.5 mg /3 mL (0.083 %) nebulizer solution 2.5 mg, 2.5 mg, Nebulization, Q4H PRN, Pace, Hagan Eggleston, FNP   ALPRAZolam  (XANAX ) tablet 0.5 mg, 0.5 mg, Oral, TID, Pace, Hagan Eggleston, FNP, 0.5 mg at 08/15/24 2303   aspirin  (ECOTRIN) tablet 81 mg, 81 mg, Oral, Daily, Pace, Hagan Eggleston, FNP   atorvastatin  (LIPITOR) tablet 80 mg, 80 mg, Oral, Nightly, Pace, Hagan Eggleston, FNP, 80 mg at 08/15/24 2303   calcium  carbonate (TUMS) chewable tablet 400 mg elem calcium , 400 mg elem calcium ,  Oral, Daily PRN, Pace, Hagan Eggleston, FNP   clopidogrel  (PLAVIX ) tablet 75 mg, 75 mg, Oral, Daily, Pace, Brutus Bryant, FNP   dextrose  (GLUTOSE) 40 % gel 15 g of dextrose , 15 g of dextrose , Oral, Q10 Min PRN, Pace, Hagan Eggleston, FNP   dextrose  50 % in water  (D50W) 50 % solution 12.5 g, 12.5 g, Intravenous, Q15 Min PRN, Pace, Brutus Bryant, FNP   DULoxetine  (CYMBALTA ) DR capsule 30 mg, 30 mg, Oral, Daily, Pace, Hagan Eggleston, FNP   empagliflozin  (JARDIANCE ) tablet 25 mg, 25 mg, Oral, Daily, Pace, Hagan Eggleston, FNP   enoxaparin (LOVENOX) syringe 30 mg, 30 mg, Subcutaneous, Q24H, Pace, Hagan Eggleston, FNP, 30 mg at 08/15/24 1737   escitalopram oxalate (LEXAPRO) tablet 20 mg, 20 mg, Oral, Daily, Pace, Hagan Eggleston, FNP   furosemide  (LASIX ) injection 40 mg, 40 mg, Intravenous, Daily, Pace, Hagan Eggleston, FNP, 40 mg at 08/16/24 1000   [Provider Hold] furosemide  (LASIX ) tablet 20 mg, 20 mg, Oral, Daily, Pace, Hagan Eggleston, FNP   gabapentin  (NEURONTIN ) capsule 300 mg, 300 mg, Oral, Daily, Pace, Hagan Eggleston, FNP, 300 mg at 08/15/24 2303   glucagon injection 1 mg, 1 mg, Intramuscular, Once PRN, Pace, Hagan Eggleston, FNP   guaiFENesin  (ROBITUSSIN) oral syrup, 200 mg, Oral, Q4H PRN, Pace, Hagan Eggleston, FNP   hydrALAZINE  (APRESOLINE ) injection 10 mg, 10 mg, Intravenous, Q4H PRN, Richards Chiquita Jansky, MD, 10 mg at 08/16/24 1338   insulin  glargine (LANTUS ) injection BASAL 40 Units, 40 Units, Subcutaneous, Nightly, Pace, Hagan Eggleston, FNP, 40 Units at 08/15/24 2301   insulin  lispro (HumaLOG) injection CORRECTIONAL 0-20 Units, 0-20 Units, Subcutaneous, ACHS, Elisabeth [place], FNP, 2 Units at 08/15/24 2300   levETIRAcetam (  KEPPRA) injection 1,000 mg, 1,000 mg, Intravenous, Q12H SCH, Heath, Chiquita Jansky, MD   melatonin tablet 3 mg, 3 mg, Oral, Nightly PRN, Pace, Hagan Eggleston, FNP   nicotine  (NICODERM CQ ) 21 mg/24 hr patch 1 patch, 1 patch, Transdermal,  Daily PRN, Pace, Hagan Eggleston, FNP   nystatin (MYCOSTATIN) powder 1 Application, 1 Application, Topical, TID, Pace, Hagan Eggleston, FNP, 1 Application at 08/16/24 1000   ondansetron  (ZOFRAN ) injection 4 mg, 4 mg, Intravenous, Q8H PRN **OR** ondansetron  (ZOFRAN ) injection 8 mg, 8 mg, Intravenous, Q8H PRN, Pace, Hagan Eggleston, FNP   pantoprazole  (Protonix ) EC tablet 40 mg, 40 mg, Oral, Daily, Pace, Hagan Eggleston, FNP   propranolol  (INDERAL ) tablet 60 mg, 60 mg, Oral, BID, Pace, Hagan Eggleston, FNP, 60 mg at 08/15/24 2303   sacubitril -valsartan  (ENTRESTO ) 24-26 mg tablet 1 tablet, 1 tablet, Oral, BID, Pace, Hagan Eggleston, FNP, 1 tablet at 08/15/24 2303   [Provider Hold] spironolactone (ALDACTONE) split tablet 12.5 mg, 12.5 mg, Oral, Daily, Pace, Hagan Eggleston, FNP   topiramate  (Topamax ) tablet 50 mg, 50 mg, Oral, BID, Pace, Hagan Eggleston, FNP, 50 mg at 08/15/24 2312 [2] Past Medical History: Diagnosis Date   CHF (congestive heart failure) (CMS-HCC)    COPD (chronic obstructive pulmonary disease) (CMS-HCC)    Diabetes mellitus (CMS-HCC)    Hypertension    Nonischemic cardiomyopathy    (CMS-HCC)    Premature ventricular contractions    26% by [employer]   Renal disorder    Stage 3 CKD   Tobacco use   [3] Past Surgical History: Procedure Laterality Date   APPENDECTOMY     BACK SURGERY     Bilat leg amputation     CESAREAN SECTION     CHOLECYSTECTOMY    [4] History reviewed. No pertinent family history. "

## 2024-08-17 NOTE — Progress Notes (Addendum)
" °   08/17/24 0735  Vitals  Pulse 74  Resp (!) 28  SpO2 90 %  BP (!) 155/78  Oxygen Therapy/Pulse Ox  O2 Device Large bore nasal cannula (cool high flow)  O2 Therapy Oxygen humidified  O2 Flow Rate (L/min) 12 L/min  $$ Pulse Oximetry Charges Continuous    Increased to 12L HFNC spo2 90%. Attempted to suction back of patient's throat with catheter. Small white secretions noted. "

## 2024-08-17 NOTE — Progress Notes (Signed)
 " PROGRESS NOTE [facility] 08/17/2024    Patient name: [name] DOB 05-09-1971 MRN#: [id number] PCP: Skillman, Katherine E, [facility] Time: 2:48 PM Primary Care Provider:  Skillman, Katherine E, [facility] Inpatient primary attending provider: Chiquita Earnie Richards, MD   Hospital Course: 08/15/24 Admitted for volume overload, elevated BNP 70,000.  Pleural effusions and mild pulmonary edema on imaging.  Blood sugar 400s.  _____________________________________  CHIEF COMPLAINT: nausea, vomiting, abdominal pain, low urine output   Subjective: Patient remains unresponsive.  She has been hemodynamically stable but oxygen requirement up overnight, now maxed on high flow nasal cannula.  Patient has had very poor urine output and shows evidence of impending renal failure.  I had a long discussion with the family at bedside and explained her very poor prognosis, that she appears to be approaching the end of her life.  They want to focus on her comfort and agree she would not benefit from a resuscitation attempt.  Transition to comfort care with DNR and DNI CODE STATUS.   Assessment and Plan:   # Suspect Acute ischemic CVA, right caudate Patient with decreasing verbal responsiveness on presentation and developed unresponsiveness and left arm hemiparesis on day 2. CTH shows focal hypodensity in the right caudate body and adjacent white matter concerning for infarct, age-indeterminate. Recent echo showed no clot, bubble study not done. Lower suspicion for seizure, CK normal. LDL 119 on statin. TSH 5.105 non-contributory. Urine culture with mixed flora. Carotid US  bilateral, no critical stenosis. MRA head no intracranial vascular lesion. MRA head and carotid US  show no large vessel occlusion. - transfer to ICU - Code stroke was called, appreciate neurology recommendations, started keppra 1g BID, may need to transfer for EEG if MRI does not show acute stroke - MRI brain to further evaluate caudate  lesion, discussed with family risk of aspiration laying flat for an extended time given she is already on CVA treatment, and they would like to defer  - NPO - transition to comfort care   # AHRF # Acute on chronic HFrEF # R>L pleural effusions Presented with n/v (gut edema?), BNP 70K, pulmonary edema. EF 35-40%, pericardial effusion resolved on limited repeat echo.  Recent echo 1/12 with global hypokinesis, RV dysfunction, small pericardial effusion, moderate MR, moderate AAS, moderate AR. Thoracentesis deferred on presentation as not hypoxic.  - O2 as needed - follows [facility] cardiology   # Elevated troponin Up to 254. Demand ischemia suspected due to above per cardiology.   # PVCs - follows with EP at [facility]   # Abnormal urinalysis   # Oliguirc AKI on CKD3b Baseline Cr appears 1.4-2.2.   # CAD # Severe PAD # Bilateral AKA   # Diabetes, uncontrolled Hba1c 10.1   # [place]cco dependence  CODE status DNR/ DNR  Dispo: - comfort care, anticipate inpatient death - will refer to hospice if still with us  tomorow _____________________________________  Temp:  [37.1 C (98.8 F)-38.6 C (101.4 F)] 37.9 C (100.2 F) Pulse:  [74-85] 81 SpO2 Pulse:  [80-106] 80 Resp:  [11-34] 30 BP: (132-175)/(57-92) 158/61 FiO2 (%):  [60 %] 60 % SpO2:  [84 %-98 %] 93 % Body mass index is 27.15 kg/m. Intake/Output last 3 shifts: I/O last 3 completed shifts: In: 73 [IV Piggyback:50] Out: 200 [Urine:200]  Physical Exam  Constitutional: She appears acutely ill.  Cardiovascular: Normal rate and regular rhythm.  Pulmonary/Chest: Breath sounds normal.  Abdominal: Soft. There is no abdominal tenderness.  Musculoskeletal:     Cervical back: Neck  supple.     Comments: Bilateral AKA  Neurological:  Patient is obtunded with altered breathing pattern, unable to follow commands or track  Consults Requested  IP CONSULT TO HOSPITALIST IP CONSULT TO NUTRITION SERVICES IP CONSULT TO CARDIOLOGY IP  CONSULT TO SPIRITUAL CARE     In hospital Nutrition: NPO No Exceptions; Medically necessary    An advanced care planning discussion was not had with patient and/or patient's decisions maker (documented separately).  CODE STATUS :                    DNR and DNI   Discharge estimated within 1-2 days. Anticipated disposition:  Anticipate death ______________________________________________________________  Current Medications[1] ________________________________________________________________  Not on File   Past Medical History[2]  Past Surgical History[3]   Family History[4]        Imaging  ECG 12 Lead Result Date: 08/17/2024 Sinus rhythm with AV dissociation and Wide QRS rhythm with premature supraventricular complexes and fusion complexes Left ventricular hypertrophy with QRS widening ( R in aVL , Cornell product ) Marked T wave abnormality, consider anterolateral ischemia Abnormal ECG When compared with ECG of 16-Aug-2024 08:43, Wide QRS rhythm has replaced Sinus rhythm  XR Chest 1 view Result Date: 08/17/2024 Exam:  Chest Single Frontal View  History: Shortness of breath  Technique:  Single frontal view.  Comparison: CT of the chest without contrast 08/15/2024  Findings:   Stable enlarged cardiomediastinal silhouette and pulmonary arteries. Small pleural effusions. No pneumothorax. Bibasilar atelectasis.    Stable mild to moderate pulmonary edema.    Signed (Electronic Signature): 08/17/2024 6:18 AM Signed By: Sula Badger MD  PVL Carotid Duplex Bilateral Result Date: 08/16/2024 Exam:  Ultrasound of the Carotid Arteries  History: History of stroke. Evaluate for carotid stenosis.  Technique:  Real-time high-resolution ultrasound of both the left and right carotid and vertebral arteries was performed. In addition to the grayscale examination, the study was supplemented with color flow and Doppler interrogation.  Comparison:   None.  Velocity measurements and ratios:  RIGHT  CAROTID DUPLEX DATA: ICA peak systolic velocity: 347 cm/sec ICA end diastolic velocity: 38.9 cm/sec  CCA peak systolic velocity: 75.8 cm/sec CCA end diastolic velocity: 13.7 cm/sec  Systolic ratio ICA/CCA: 4.58  LEFT CAROTID DUPLEX DATA: ICA peak systolic velocity: 154 cm/sec ICA end diastolic velocity: 3.5 cm/sec  CCA peak systolic velocity: 130 cm/sec CCA end diastolic velocity: 22.0 cm/sec  Systolic ratio ICA/CCA: 1.18  COMMENT: All measurements of stenosis in the report above were calculated using the NASCET [facility] American Symptomatic Carotid Endarterectomy Trial) methodology for measuring carotid artery stenosis, whereby the narrowest transverse measurement of luminal dimension at the point of the maximum stenosis is stated relative to the diameter of the closest normal-appearing portion of the same vessel beyond the stenosis.  Findings: Difficult study secondary to difficulties in patient cooperation, and moving during the examination.  On the right, there is intimal thickening in the distal common carotid artery. Moderate noncalcified plaque in the proximal external carotid artery. No significant plaquing in the right internal carotid artery.  On the left, significant noncalcified plaque is present in the distal common carotid artery. Moderate plaquing is present in the proximal external carotid artery.  Antegrade flow within the vertebral arteries, on limited evaluation.    1.    Elevated peak systolic velocity in the right internal carotid artery, suggesting greater than 70% stenosis. 2.    Elevated peak systolic velocity in the left internal carotid artery, suggesting 50-69%  stenosis. 3.    Antegrade flow within the vertebral arteries on limited evaluation.     Signed (Electronic Signature): 08/16/2024 4:10 PM Signed By: Deward DELENA Brock, MD  MRA Head Wo Contrast Result Date: 08/16/2024 Exam: MRA circle of Willis.  History: 54 year old. Stroke.  Comparison: Head CT. August 16, 2024.  Technique: Standard  MRA circle of Willis performed..  Findings:  Examination compromised by motion artifact. With this in mind, no large vessel occlusion is seen. No evidence of aneurysm formation around the circle of Willis. There are large caliber posterior communicating arteries bilaterally. There is  aplasia of the P1 segments of both posterior cerebral arteries. This leads to  small caliber of the intracranial vertebrobasilar system.     1. Intracranial MRA within normal limits. Examination mildly limited by patient motion.  Signed (Electronic Signature): 08/16/2024 3:51 PM Signed By: Fairy KATHEE Shipper, MD  CT Head Wo Contrast Result Date: 08/16/2024 Exam:  CT Head without Contrast  History: Not responsive, altered mental status  Technique: Routine brain CT without IV contrast. AEC (automated exposure control) and/or manual techniques such as size-specific kV and mAs are employed where appropriate to reduce radiation exposure for all CT exams.  Comparison:  None.  Findings: Ventricles and sulci are appropriate for the patient's age. There is focal hypodensity in the right caudate body and adjacent white matter. This is ill-defined. Significant motion artifact is noted. Focal hypodensity in the left thalamus appears more chronic. No mass effect, midline shift, or extra-axial fluid collection. No intracranial hemorrhage. Basal cisterns are patent. The globes are intact bilaterally. Paranasal sinuses and mastoid air cells are clear.    Focal hypodensity in the right caudate body and adjacent white matter, concerning for infarct, technically age-indeterminate. Consider MRI for further assessment.  Findings were communicated to Augustin Cecil, RN, at 10:10 a.m. on 08/16/2024.  Signed (Electronic Signature): 08/16/2024 10:13 AM Signed By: Dorothyann Jointer, MD  ECG 12 Lead Result Date: 08/16/2024 Normal sinus rhythm Possible Left atrial enlargement Left posterior fascicular block Nonspecific T wave abnormality Abnormal ECG When  compared with ECG of 15-Aug-2024 18:57, Vent. rate has decreased by  38 bpm Nonspecific T wave abnormality now evident in Anterior leads  ECG 12 Lead Result Date: 08/15/2024  Poor data quality, interpretation may be adversely affected  Age and gender specific ECG analysis  Sinus tachycardia Possible Left atrial enlargement Rightward axis ST elevation, consider lateral injury or acute infarct ** ** ACUTE MI / STEMI ** ** Abnormal ECG When compared with ECG of 20-Aug-2023 23:28, premature ventricular complexes are no longer present Minimal criteria for Anterior infarct are no longer present T wave inversion now evident in Inferior leads Nonspecific T wave abnormality, improved in Anterolateral leads Best Test due to patient restlessness  Echocardiogram Follow Up/Limited Echo Result Date: 08/15/2024 Patient Info Name:     Lashea Goda Age:     53 years DOB:     1971-01-24 Gender:     Female MRN:     [id number] Accession #:     [id number] [facility] Account #:     [id number] Ht:     152 cm Wt:     62 kg BSA:     1.63 m2 BP:     145 /     86 mmHg HR:     94 bpm Exam Date:     08/15/2024 3:05 PM Admit Date:     08/15/2024 Exam Type:     ECHOCARDIOGRAM FOLLOW UP/LIMITED ECHO Technical Quality:  Good Staff Sonographer:     Mliss Gate Supervising Physician:     Earla Maude Currier MD Ordering Physician:     Brutus Bryant Pace Study Info Indications      - BNP > 70000 Procedure(s)   Limited 2D transthoracic echocardiogram is performed. Summary   1. The left ventricle is moderately dilated in size with mildly increased wall thickness.   2. The left ventricular systolic function is moderately decreased, LVEF is visually estimated at 35-40%.   3. The left atrium is dilated in size.   4. The right ventricle is mildly dilated in size, with mildly reduced systolic function.   5. The right atrium is dilated in size.   6. Valve function was not adequately assessed on limited echo. Left Ventricle   The left ventricle is  moderately dilated in size with mildly increased wall thickness. The left ventricular systolic function is moderately decreased, LVEF is visually estimated at 35-40%. Left ventricular diastolic function cannot be accurately assessed. Right Ventricle   The right ventricle is mildly dilated in size, with mildly reduced systolic function. Left Atrium   The left atrium is dilated in size. Right Atrium   The right atrium is dilated in size. Aortic Valve   The aortic valve is not well visualized. Mitral Valve   The mitral valve leaflets are normal with normal leaflet mobility. Tricuspid Valve   The tricuspid valve leaflets are normal, with normal leaflet mobility. Aorta   The aorta is normal in size in the visualized segments. Inferior Vena Cava   The IVC is not well visualized precluding the ability to accurate assess right atrial pressure. Pericardium/Pleural   There is no pericardial effusion. Ventricles ---------------------------------------------------------------------- Name                                 Value        Normal ---------------------------------------------------------------------- LV Dimensions 2D/MM ----------------------------------------------------------------------  IVS Diastolic Thickness (2D)                                1.1 cm       0.6-0.9 LVID Diastole (2D)                  5.9 cm       3.8-5.2  LVPW Diastolic Thickness (2D)                                1.1 cm       0.6-0.9 LVID Systole (2D)                   4.6 cm       2.2-3.5 LV Mass Index (2D Cubed)          166 g/m2         43-95  Relative Wall Thickness (2D)                                  0.37        <=0.42 LV Function ---------------------------------------------------------------------- LV EF (4C MOD)                        34 %  LV Diastolic Volume Index (BP MOD)                        61.8 ml/m2     29.0-61.0 LV EF (BP MOD)                        30 %         54-74 RV Dimensions 2D/MM  ----------------------------------------------------------------------  RV Basal Diastolic Dimension                           4.3 cm       2.5-4.1 Aorta ---------------------------------------------------------------------- Name                                 Value        Normal ---------------------------------------------------------------------- Ascending Aorta ---------------------------------------------------------------------- Ao Root Diameter (2D)               2.2 cm               Ao Root Diam Index (2D)          1.3 cm/m2 Report Signatures Finalized by Earla Maude Currier  MD on 08/15/2024 04:23 PM  CT chest without contrast Result Date: 08/15/2024 Exam: CT of the Chest without Contrast  History: Right flank pain. Chronic kidney disease. Diabetes. Last insulin  taken yesterday morning.  Technique: Contiguous axial images were obtained through the chest without intravenous contrast. Multiplanar and maximum intensity projection axial reformats were obtained. AEC (automated exposure control) and/or manual techniques such as size-specific kV and mAs are employed where appropriate to reduce radiation exposure for all CT exams.  Comparison: AP chest 08/15/2024, chest 2 views 08/20/2023, CTA chest 02/09/2012  Findings: LUNGS: Mild to moderate motion artifact. The central airways are patent. Moderate right and mild to moderate left pleural effusions with associated mild submental atelectasis. Segmental atelectasis within the inferior medial right middle lobe. Bilateral scattered groundglass opacities. No pneumothorax.  HEART: Heart size is moderately enlarged, mildly increased from 02/09/2012. Trace pericardial fluid. Moderate to severe coronary artery calcifications.  GREAT VESSELS: The main pulm artery measures up to 3.3 cm, newly enlarged as can be seen with chronic pulmonary arterial hypertension. No thoracic aortic aneurysm. Mild to moderate atherosclerotic calcifications.  MEDIASTINUM: No lymphadenopathy.   BONES: Mild multilevel degenerative disc changes of the thoracic spine, greatest at the anterior, mid to superior thoracic spine.  SOFT TISSUES: Mild subcutaneous fat edema diffusely, likely mild anasarca.  UPPER ABDOMEN: Limited view of the upper abdomen is unremarkable.    1.    Moderate right and mild to moderate left pleural effusions with associated mild atelectasis. 2.    Bilateral scattered groundglass opacities, possibly subsegmental atelectasis versus mild pulmonary edema. 3.    Moderate cardiomegaly, mildly increased compared to 2013. Moderate to severe coronary artery calcifications. 4.    Mild enlargement of the main pulmonary artery as can be seen with chronic pulmonary arterial hypertension.   Signed (Electronic Signature): 08/15/2024 2:13 PM Signed By: Tanda Lyons  CT Abdomen Pelvis Wo Contrast Result Date: 08/15/2024 Exam: CT of the Abdomen and Pelvis without Contrast  History: 54 year old female with right flank pain  Technique: Routine CT of the abdomen and pelvis without IV contrast. AEC (automated exposure control) and/or manual techniques such as size-specific kV and mAs are employed where appropriate to reduce  radiation exposure for all CT exams.  Comparison: 08/20/2023 CT  Findings: LOWER CHEST: Moderate right and small left pleural effusions. Cardiomegaly. Partially visualized groundglass in the lower lobes with passive atelectasis.  HEPATOBILIARY: Normal  SPLEEN: Normal.  PANCREAS: Normal.  ADRENALS: Normal.  KIDNEYS: No hydronephrosis. Right renal atrophy.  GASTROINTESTINAL: No abnormally dilated or thickened loops of bowel. Small volume ascites. No pneumoperitoneum.  PELVIC ORGANS: Small volume free fluid in the pelvis.  LYMPH NODES: No lymphadenopathy.  VASCULATURE: Calcified atherosclerosis, normal caliber abdominal aorta. Right femoral bypass graft is poorly evaluated without contrast.  SOFT TISSUES: Small fat-containing right groin hernia.  BONES: No aggressive lesion. No acute  abnormality.    1.    No acute findings in the abdomen or pelvis. 2.    Cardiomegaly with fluid overload. Moderate right and small left pleural effusions with small volume ascites.    Signed (Electronic Signature): 08/15/2024 2:03 PM Signed By: Carlin Essex, MD  XR Chest Portable Result Date: 08/15/2024 Exam:  Portable Chest  History: Cough  Technique:  Single frontal view.  Comparison: Chest radiograph dated 08/20/2023  Findings:    Diffuse, mild interstitial opacities.  Bibasilar opacities. Small bilateral pleural effusions. No pneumothorax.  The cardiomediastinal silhouette is unchanged.     *    Probable mild pulmonary edema with small bilateral pleural effusions. *    Bibasilar opacities, atelectasis versus infection.    Signed (Electronic Signature): 08/15/2024 9:41 AM Signed By: Reyes Going, MD   Lab Results   Recent Labs    08/17/24 0518  WBC 13.1*  HGB 11.4*  HCT 36.5  PLT 195   Recent Labs    08/16/24 0427 08/16/24 0854 08/16/24 1330 08/17/24 0518  NA 131*  --   --  137  K 4.7  --   --  5.1*  CL 104  --   --  104  CO2 20.4*  --   --  15.1*  BUN 31*  --   --  47*  CREATININE 2.15*  --   --  3.07*  GLU 67*  --   --  95  CALCIUM  8.8  --   --  8.6  ALBUMIN 3.4*  --   --  3.1*  PROT 7.0  --   --  6.7  BILITOT 0.8  --   --  0.9  AST 17  --   --  33  ALT 24  --   --  24  ALKPHOS 146*  --   --  138*  MG 1.9  --   --   --   PHOS  --   --   --  5.5*  TSH  --  5.105*  --   --   AMMONIA  --   --  10*  --    Recent Labs    08/16/24 0854 08/17/24 0518  CKTOTAL 81.0  --   TROPONINI  --  293*   Recent Labs    08/15/24 1014  WBCUA 24*  NITRITE Negative  LEUKOCYTESUR Trace*  BACTERIA Rare*  RBCUA 10*  BLOODU Moderate*  GLUCOSEU >1000 mg/dL*  PROTEINUA 399 mg/dL*  KETONESU Trace*   Recent Labs    08/15/24 1014  OPIAU Negative  BENZU Negative  AMPHU Negative  COCAU Negative  CANNAU Negative  BARBU Negative   No results for input(s): PREGTESTUR,  PREGPOC in the last 72 hours. Recent Labs    08/15/24 1022 08/16/24 0854  A1C 10.1*  --   CHOL  --  166  LDL  --  119*  HDL  --  36*  TRIG  --  66   Recent Labs    08/16/24 1017 08/17/24 0659  PHART 7.47* 7.32*  PCO2ART 19.5* 29.3*  PO2ART 80* 61*  HCO3ART 14.1*  --   O2SATART 96.4 88.0*  BEART -7.4*  --    Pending Labs     Order Current Status   Lipoprotein a (LP(a)) In process   Blood Culture Preliminary result   Blood Culture Preliminary result       Chiquita CHRISTELLA Richards, MD Hospitalist, [facility] 08/17/2024, 2:48 PM      [1]  Current Facility-Administered Medications:    acetaminophen  (TYLENOL ) oral liquid, 650 mg, Oral, Q4H PRN **OR** acetaminophen  (TYLENOL ) tablet 650 mg, 650 mg, Oral, Q4H PRN **OR** acetaminophen  (TYLENOL ) suppository 650 mg, 650 mg, Rectal, Q4H PRN, Richards Chiquita Jansky, MD   albuterol  2.5 mg /3 mL (0.083 %) nebulizer solution 2.5 mg, 2.5 mg, Nebulization, Q4H PRN, Pace, Hagan Eggleston, FNP   atropine ophthalmic solution 1%, 2 drop, Sublingual, Q4H PRN, Richards Chiquita Jansky, MD, 2 drop at 08/17/24 1301   calcium  carbonate (TUMS) chewable tablet 400 mg elem calcium , 400 mg elem calcium , Oral, Daily PRN, Pace, Brutus Bryant, FNP   diazePAM (VALIUM) injection 2.5 mg, 2.5 mg, Intravenous, Q4H PRN, Richards Chiquita Jansky, MD   guaiFENesin  (ROBITUSSIN) oral syrup, 200 mg, Oral, Q4H PRN, Pace, Hagan Eggleston, FNP   HYDROmorphone  (DILAUDID ) injection 0.2 mg, 0.2 mg, Intravenous, Q2H PRN **OR** HYDROmorphone  (DILAUDID ) injection 0.4 mg, 0.4 mg, Intravenous, Q2H PRN, Richards Chiquita Jansky, MD, 0.4 mg at 08/17/24 1300   melatonin tablet 3 mg, 3 mg, Oral, Nightly PRN, Pace, Brutus Bryant, FNP   nicotine  (NICODERM CQ ) 21 mg/24 hr patch 1 patch, 1 patch, Transdermal, Daily PRN, Pace, Hagan Eggleston, FNP   ondansetron  (ZOFRAN ) injection 4 mg, 4 mg, Intravenous, Q8H PRN **OR** ondansetron  (ZOFRAN ) injection 8 mg, 8 mg, Intravenous, Q8H PRN, Pace,  Brutus Bryant, FNP [2] Past Medical History: Diagnosis Date   CHF (congestive heart failure) (CMS-HCC)    COPD (chronic obstructive pulmonary disease) (CMS-HCC)    Diabetes mellitus (CMS-HCC)    Hypertension    Nonischemic cardiomyopathy    (CMS-HCC)    Premature ventricular contractions    26% by [employer]   Renal disorder    Stage 3 CKD   Tobacco use   [3] Past Surgical History: Procedure Laterality Date   APPENDECTOMY     BACK SURGERY     Bilat leg amputation     CESAREAN SECTION     CHOLECYSTECTOMY    [4] History reviewed. No pertinent family history. "

## 2024-08-18 NOTE — Consults (Signed)
 Speech Language Pathology Clinical Swallow Assessment Evaluation (08/18/24 1030)  Patient Name:  [name]      Medical Record Number: [id number]  Date of Birth: July 24, 1971 Sex: Female          SLP Treatment Diagnosis: Dysphagia- Oropharyngeal Activity Tolerance: Treatment limited secondary to medical complications (Comment) (Poor mentation, acute CVA, poor medical prognosis)  Assessment The patient presents with concerns for an oropharyngeal dysphagia following acute CVA. Patient's predisposing dysphagia risk factors include CHF, COPD, DM, HTN, and h/o tobacco use. CTH shows focal hypodensity in the right caudate body and adjacent white matter concerning for infarct, age-indeterminate. Since being admitted to the ICU, patient has been unresponsive. This morning, patient's mentation was reported to be improving with her being able to say her name/DOB and follow simple commands.  Upon entry, patient was laying down in the bed accompanied by her son and his girlfriend. Patient was unable to say name/DOB for this encounter so wristband was used to confirm patient ID. OME was limited by patient's inconsistent ability to follow commands. Xerostomia present, as tongue is dry with brown callus like spot. Roof of mouth also had brown mucous. SLP performed oral care which was somewhat helpful at getting rid of oral mucous. Patient had delayed coughing when rinsing her mouth out. She was unable to take sips of thin/0 by straw, so SLP gave her 2 trials of thin/0 by spoon. Both had immediate coughing. Then, SLP gave 2 trials of ice chips by spoon and both trials had delayed coughing. Following PO attempts, SLP discussed with the patient's son and his girlfriend about options moving forward. D/t the high likelihood that patient is aspirating, if family chooses to allow PO for comfort feedings, they would have to acknowledge that letting her have PO intake could lead to further pulmonary compromise. If  patient's family chooses to continue life longing measures, the patient is not safe to have PO intake and would need to remain NPO and have alternative means of nutrition (such as TPN, NG, or PEG). SLP informed the family that they could take some time to think about a decision like this and if needed could revisit the conversation with other family members later this date. The son and girlfriend both verbalized understanding of SLP recommendations. At the end of the session, patient was left laying comfortably in bed accompanied by family.  Once family makes GOC decisions, further diet recommendations can be made. If family chooses life prolonging measures, patient should be NPO. If family chooses comfort care, SLP recommending puree/4 and thin/0 for pleasure feedings. SLP informed provider (Dr. Heath) and nurse (April) of this encounter.  Recommendations:   If GOC are life prolonging: Consider alternative nutrition/Strict NPO If GOC are comfort measures: PO Diet   - Diet Liquids Recommendations: Thin Liquids, Level 0   - Diet Solids Recommendation: Puree - Extremely Thick, Level 4    Recommended Form of Medications: Feeding tube    Prognosis: Poor Barriers to Discharge: Behavior, Cognitive deficits, Decreased range of motion, Decreased safety awareness, Impulsivity, Inability to safely perform ADLS, Language deficits, No nutrition source, Poor insight into deficits, Severity of deficits  Patient and Family Goal: Family is still determining goals of care Risk for Aspiration: Severe  Subjective Patient was lethargic, uncooperative, and could not follow commands during this evaluation Medical Updates Since Last Visit/Relevant PMH Affecting Clinical Decision Making: Patient has been made DNR/DNI after discussion with family members Prior Function: Independent prior to admission  Communication Preference: Verbal  Pain: no s/s of pain   Allergies: Patient has no allergy information on  record. Current Medications[1] Past Medical History[2] Family History[3] Past Surgical History[4] Social History   Tobacco Use   Smoking status: Every Day    Current packs/day: 1.00    Types: Cigarettes   Smokeless tobacco: Never  Substance Use Topics   Alcohol  use: Never   General: Hearing Exceptions: None                Self-Feeding Capacity: Impaired cognition                Medical Tests / Procedures Comments: CTH shows focal hypodensity in the right caudate body and adjacent white matter concerning for infarct, age-indeterminate. Equipment/Environment: Vascular access (PIV, TLC, Port-a-cath, PICC, Supplemental oxygen, Telemetry, Foley Precautions: Non-applicable Required Braces or Orthoses: Non-applicable  Objective Temperature Spikes Noted: No Respiratory Status : High flow nasal cannula (10L) History of Intubation: No Behavior/Cognition: Lethargic, Uncooperative, Distractible, Doesn't follow directions Positioning : Upright in bed  Oral / Motor Exam OME was limited by patient's inconsistent ability to follow commands Vocal Quality: Other (comment) (Patient did not speak, unable to assess) Volitional Swallow: Delayed  Labial ROM: Reduced right, Reduced left  Labial Strength: Reduced  Lingual ROM: Reduced right, Reduced left Lingual Strength: Reduced  Lingual Sensation: Reduced Facial ROM: Reduced right, Reduced left  Facial Strength: Reduced  Gag: Within Functional Limits   Intelligibility: Unable to assess  Dentition: Edentulous, Poor dental/oral hygiene Consistencies assessed: Ice chips and Thin/0  I attest that I have reviewed the above information. Signed: Con [place], SLP  Filed 08/18/2024       [1] Current Facility-Administered Medications  Medication Dose Route Frequency Provider Last Rate Last Admin   acetaminophen  (TYLENOL ) oral liquid  650 mg Oral Q4H PRN Heath Chiquita Jansky, MD       Or   acetaminophen  (TYLENOL ) tablet 650 mg  650 mg  Oral Q4H PRN Heath Chiquita Jansky, MD       Or   acetaminophen  (TYLENOL ) suppository 650 mg  650 mg Rectal Q4H PRN Heath Chiquita Jansky, MD       albuterol  2.5 mg /3 mL (0.083 %) nebulizer solution 2.5 mg  2.5 mg Nebulization Q4H PRN Pace, Hagan Eggleston, FNP       atropine ophthalmic solution 1%  2 drop Sublingual Q4H PRN Heath Chiquita Jansky, MD   2 drop at 08/17/24 1718   diazePAM (VALIUM) injection 2.5 mg  2.5 mg Intravenous Q4H PRN Heath Chiquita Jansky, MD       HYDROmorphone  (DILAUDID ) injection 0.2 mg  0.2 mg Intravenous Q2H PRN Heath Chiquita Jansky, MD       Or   HYDROmorphone  (DILAUDID ) injection 0.4 mg  0.4 mg Intravenous Q2H PRN Heath Chiquita Jansky, MD   0.4 mg at 08/17/24 1519   ondansetron  (ZOFRAN ) injection 4 mg  4 mg Intravenous Q8H PRN Pace, Hagan Eggleston, FNP       Or   ondansetron  (ZOFRAN ) injection 8 mg  8 mg Intravenous Q8H PRN Pace, Hagan Eggleston, FNP       scopolamine (TRANSDERM-SCOP) 1 mg over 3 days topical patch 1 mg  1 patch Topical Q72H Heath Chiquita Jansky, MD   1 mg at 08/17/24 1612  [2] Past Medical History: Diagnosis Date   CHF (congestive heart failure) (CMS-HCC)    COPD (chronic obstructive pulmonary disease) (CMS-HCC)    Diabetes mellitus (CMS-HCC)    Hypertension    Nonischemic cardiomyopathy    (  CMS-HCC)    Premature ventricular contractions    26% by Zioopatch   Renal disorder    Stage 3 CKD   Tobacco use   [3] History reviewed. No pertinent family history. [4] Past Surgical History: Procedure Laterality Date   APPENDECTOMY     BACK SURGERY     Bilat leg amputation     CESAREAN SECTION     CHOLECYSTECTOMY

## 2024-08-18 NOTE — Consults (Signed)
 Adult Nutrition Consult   Note Type: Assessment Visit Type: RN Consult Reason for Visit: Assessment (Nutrition)   ASSESSMENT: HPI & PMH:  Principal Problem:   Acute on chronic systolic heart failure (CMS-HCC) Active Problems:   Weakness   DM (diabetes mellitus), type 2 (CMS-HCC)   Hypertension   Peripheral arterial disease   S/P AKA (above knee amputation) bilateral    (CMS-HCC)   CKD (chronic kidney disease) stage 3, GFR 30-59 ml/min (CMS-HCC)   Cognitive communication disorder   COPD (chronic obstructive pulmonary disease) (CMS-HCC)   Dyslipidemia   Tobacco abuse   Acute cystitis with hematuria   Elevated brain natriuretic peptide (BNP) level   HFrEF (heart failure with reduced ejection fraction) (CMS-HCC)  Past Medical History[1]   Subjective: Patient admitted with acute on chronic heart failure. Noted that patient is currently Npo at this time due to inability to chew and swallow. Evaluated by speech and recommended to only have PO intake if goal is comfort measures. Family and MD decided on enteral feedings and will begin bolus feeds today. Will place recommendations below and will continue to follow closely.   Nutrition Hx:  Average percent meals eaten:NPO Allergies: No known food allergies Current nutrition therapy order:  Nutrition Orders         NPO No Exceptions; Medically necessary: NPO starting at 01/21 1005       Meds:  Scheduled Meds:Scheduled Medications[2] Continuous Infusions:Infusions Meds[3] PRN Meds:.PRN Medications[4]   Labs: Lab Results  Component Value Date   NA 144 08/18/2024   K 5.0 08/18/2024   CL 109 (H) 08/18/2024   CO2 13.6 (L) 08/18/2024   BUN 80 (H) 08/18/2024   CREATININE 3.79 (H) 08/18/2024   GLU 91 08/18/2024   CALCIUM  8.4 (L) 08/18/2024   MG 1.9 08/16/2024   PHOS 6.5 (H) 08/18/2024   Corrected Ca2+:   Lab Results  Component Value Date   BILITOT 0.7 08/18/2024   BILIDIR 0.23 08/18/2024   PROT 6.7 08/18/2024   ALBUMIN  3.0 (L) 08/18/2024   ALT 31 08/18/2024   AST 28 08/18/2024   ALKPHOS 109 08/18/2024   Objective Information: Oxygen:N/A Bladder Status:N/A Abdominal/Bowel Status:N/A Last BM date:N/A Edema:N/A Wounds/Skin:N/A  Anthropometric Data: -- Height: 152.4 cm (5')  -- Last recorded weight: 63 kg (139 lb) -- Admission weight: 63 kg -- IBW: 45.45 kg -- BMI: Body mass index is 27.15 kg/m. (Overweight) -- Weight history:  Wt Readings from Last 10 Encounters:  08/18/24 63 kg (139 lb)  12/10/23 82.1 kg (181 lb)  07/30/23 82.1 kg (181 lb)  09/12/16 78.6 kg (173 lb 4.9 oz)  08/06/15 75.1 kg (165 lb 8.7 oz)  01/16/14 98.5 kg (217 lb 1.7 oz)  07/01/13 99.3 kg (218 lb 15.7 oz)  04/22/13 97.5 kg (214 lb 15.9 oz)  03/22/13 97.5 kg (214 lb 15.9 oz)  02/28/13 97.5 kg (214 lb 15.9 oz)  42 pound weight loss in the last 8 months  Estimated Nutrient Needs: Estimated Energy Needs (per actual body weight  of 63 kg): Energy: 22 - 25 Kcal/kg = 1386 - 1575 Kcal/day  Estimated Protein Needs (per actual body weight  of 63 kg): Protein: 1.0 - 1.2 g/kg = 63 - 76 g/day Fluid: Per MD  DIAGNOSIS: Malnutrition Assessment using AND/ASPEN Clinical Characteristics:  Inadequate Oral Intake as related to inability to chew/swallow safely as evidenced by NPO status  INTERVENTION: 1. Advance diet as medically tolerated should patient be able to tolerate PO intake 2. Initiate enteral feedings  for bolus feeds Initiate bolus feedings Start with Jevity 1.5 at 60ml per bolus feedings and advanced by 60ml every 12 hours until goal rate of per bolus is achieved Flush with water  before and after each bolus feeding Feedings 5 times daily  MONITORING/EVALUATION (Goals): Patient will be at goal tube feeding rate within 2 days and show evidence of tolerance  Monitor: GOC SLP recommendations Labs Weights  Debby Chum, RDN, LDN Clinical Dietitian 08/18/2024, 4:04 PM            [1] Past  Medical History: Diagnosis Date   CHF (congestive heart failure) (CMS-HCC)    COPD (chronic obstructive pulmonary disease) (CMS-HCC)    Diabetes mellitus (CMS-HCC)    Hypertension    Nonischemic cardiomyopathy    (CMS-HCC)    Premature ventricular contractions    26% by Zioopatch   Renal disorder    Stage 3 CKD   Tobacco use   [2]  albuterol   2.5 mg Nebulization Q6H While awake (RT)   aspirin   81 mg Enteral tube: gastric Daily   atorvastatin   80 mg Enteral tube: gastric Nightly   clopidogrel   75 mg Enteral tube: gastric Daily   empagliflozin   25 mg Enteral tube: gastric Daily   escitalopram oxalate  20 mg Enteral tube: gastric Daily   heparin  (porcine) for subcutaneous use  5,000 Units Subcutaneous Q12H [facility]   insulin  lispro  0-20 Units Subcutaneous ACHS   nystatin  1 Application Topical TID   pantoprazole   40 mg Intravenous Daily   propranolol   60 mg Enteral tube: gastric BID   topiramate   50 mg Enteral tube: gastric BID  [3] [4] acetaminophen , acetaminophen , albuterol , aluminum-magnesium  hydroxide-simethicone , dextrose  in water , glucagon, guaiFENesin , hydrALAZINE , melatonin, metoPROLOL , ondansetron  **OR** ondansetron , oxyCODONE , oxyCODONE , polyethylene glycol, senna

## 2024-08-18 NOTE — Progress Notes (Signed)
" °   08/18/24 1109  Vitals  Pulse 90  SpO2 98 %  Oxygen Therapy/Pulse Ox  O2 Device Large bore nasal cannula (cool high flow)  O2 Therapy Oxygen humidified  O2 Flow Rate (L/min) 10 L/min (TITRATED TO 6L HFNC)  $$ O2 per day charge Yes  $$ Pulse Oximetry Charges Continuous    "

## 2024-08-18 NOTE — Consults (Signed)
 Adult Nutrition Consult   Note Type: Assessment Visit Type: RN Consult Reason for Visit: Assessment (Nutrition)   ASSESSMENT: HPI & PMH:  Principal Problem:   Acute on chronic systolic heart failure (CMS-HCC) Active Problems:   Weakness   DM (diabetes mellitus), type 2 (CMS-HCC)   Hypertension   Peripheral arterial disease   S/P AKA (above knee amputation) bilateral    (CMS-HCC)   CKD (chronic kidney disease) stage 3, GFR 30-59 ml/min (CMS-HCC)   Cognitive communication disorder   COPD (chronic obstructive pulmonary disease) (CMS-HCC)   Dyslipidemia   Tobacco abuse   Acute cystitis with hematuria   Elevated brain natriuretic peptide (BNP) level   HFrEF (heart failure with reduced ejection fraction) (CMS-HCC)  Past Medical History[1]   Subjective: Patient admitted with acute on chronic heart failure. Noted that patient is currently Npo at this time due to inability to chew and swallow. Evaluated by speech and recommended to only have PO intake if goal is comfort measures. Family to decide if alternate means of nutrition will be pursued and if not, will allow patient to have comfort foods as desired. Will continue to follow if patient requires alternate nutrition and if any nutritional issues are needed to be addressed.   Nutrition Hx:  Average percent meals eaten:NPO Allergies: No known food allergies Current nutrition therapy order:  Nutrition Orders         NPO No Exceptions; Medically necessary: NPO starting at 01/21 1005       Meds:  Scheduled Meds:Scheduled Medications[2] Continuous Infusions:Infusions Meds[3] PRN Meds:.PRN Medications[4]   Labs: Lab Results  Component Value Date   NA 137 08/17/2024   K 5.1 (H) 08/17/2024   CL 104 08/17/2024   CO2 15.1 (L) 08/17/2024   BUN 47 (H) 08/17/2024   CREATININE 3.07 (H) 08/17/2024   GLU 95 08/17/2024   CALCIUM  8.6 08/17/2024   MG 1.9 08/16/2024   PHOS 5.5 (H) 08/17/2024   Corrected Ca2+:   Lab Results   Component Value Date   BILITOT 0.9 08/17/2024   BILIDIR 0.30 08/17/2024   PROT 6.7 08/17/2024   ALBUMIN 3.1 (L) 08/17/2024   ALT 24 08/17/2024   AST 33 08/17/2024   ALKPHOS 138 (H) 08/17/2024   Objective Information: Oxygen:N/A Bladder Status:N/A Abdominal/Bowel Status:N/A Last BM date:N/A Edema:N/A Wounds/Skin:N/A  Anthropometric Data: -- Height: 152.4 cm (5')  -- Last recorded weight: 63 kg (139 lb) -- Admission weight: 63 kg -- IBW: 45.45 kg -- BMI: Body mass index is 27.15 kg/m. (Overweight) -- Weight history:  Wt Readings from Last 10 Encounters:  08/17/24 63 kg (139 lb)  12/10/23 82.1 kg (181 lb)  07/30/23 82.1 kg (181 lb)  09/12/16 78.6 kg (173 lb 4.9 oz)  08/06/15 75.1 kg (165 lb 8.7 oz)  01/16/14 98.5 kg (217 lb 1.7 oz)  07/01/13 99.3 kg (218 lb 15.7 oz)  04/22/13 97.5 kg (214 lb 15.9 oz)  03/22/13 97.5 kg (214 lb 15.9 oz)  02/28/13 97.5 kg (214 lb 15.9 oz)  42 pound weight loss in the last 8 months  Estimated Nutrient Needs: Estimated Energy Needs (per actual body weight  of 63 kg): Energy: 22 - 25 Kcal/kg = 1386 - 1575 Kcal/day  Estimated Protein Needs (per actual body weight  of 63 kg): Protein: 1.0 - 1.2 g/kg = 63 - 76 g/day Fluid: Per MD  DIAGNOSIS: Malnutrition Assessment using AND/ASPEN Clinical Characteristics:  Inadequate Oral Intake as related to inability to chew/swallow safely as evidenced by NPO status  INTERVENTION: 1. Advance diet as medically tolerated should patient be able to tolerate PO intake 2. Evaluate GOCs to determine if patient desires alternate means of nutrition should patient not be able to eat foods  MONITORING/EVALUATION (Goals): Nutrition therapy advancement or alternative nutrition.  Monitor: GOC SLP recommendations Labs Weights  Debby Chum, [place], LDN Clinical Dietitian 08/18/2024, 11:27 AM           [1] Past Medical History: Diagnosis Date   CHF (congestive heart failure) (CMS-HCC)    COPD  (chronic obstructive pulmonary disease) (CMS-HCC)    Diabetes mellitus (CMS-HCC)    Hypertension    Nonischemic cardiomyopathy    (CMS-HCC)    Premature ventricular contractions    26% by Zioopatch   Renal disorder    Stage 3 CKD   Tobacco use   [2]  scopolamine  1 patch Topical Q72H  [3] [4] acetaminophen  **OR** acetaminophen  **OR** acetaminophen , albuterol , atropine 1% sublingual solution, diazePAM, HYDROmorphone  **OR** HYDROmorphone , ondansetron  **OR** ondansetron 

## 2024-08-18 NOTE — Care Plan (Signed)
 Shift Summary Oxygen therapy was titrated down throughout the shift while maintaining adequate SpO2 levels. Severe aspiration risk was managed with NPO status and frequent aspiration precautions. Motor and cognitive impairments persisted, requiring ongoing assistance and safety interventions. Pain was reported as 6/10 in the afternoon and oxyCODONE  was administered PRN. Overall, the patient remained on oxygen therapy with persistent cognitive and sensorimotor deficits and continued aspiration precautions.  Optimal Cognitive Function: Cognition fluctuated throughout the shift, with intermittent ability to follow commands and persistent disorientation to place, time, and situation; alertness was variable, and attention span and memory remained impaired. Assistance was required for safety judgment and problem solving, and periods of drowsiness and lethargy were noted.  Improved Communication Skills: Speech was slurred with delayed responses, and difficulty expressing thoughts and feelings persisted; communication was usually understood with prompting, but assistance was needed to express thoughts and feelings.  Effective Oxygenation and Ventilation: Oxygen flow rate was titrated down from 10 L/min to 2 L/min while SpO2 remained above 95% for most of the shift; oxygen therapy was maintained and respiratory rate was 24 bpm post-treatment, with FiO2 at 28% and venous blood gas showing low O2 saturation and bicarbonate.  Improved Sensorimotor Function: Motor coordination and strength were impaired in both upper extremities, with absent fine motor coordination in the left hand and impaired in the right; positioning supports and pillows were consistently used to optimize sensorimotor function and safety.  Safe and Effective Swallow: Severe aspiration risk was identified, with NPO status maintained and frequent aspiration precautions documented; swallow evaluation showed delayed volitional swallow and reduced  oral motor function, with recommendations for extremely thick puree and thin liquids if diet advanced.

## 2024-08-18 NOTE — Nursing Note (Signed)
" °   08/18/24 1313  Rapid Rounds  Attendance Nursing;Physician;Advanced Practice Provider (APP);Case Manager;Occupational Therapy;Pharmacy;Physical Therapy;Respiratory Therapy;Social Work/Services;Speech Language Pathology  Today we still await: Clinical stability   Will have NG tube placed. Waiting to see if patient has any clinical improvement before discussing goals of care and discharge disposition.  "

## 2024-08-18 NOTE — Consults (Signed)
 " OCCUPATIONAL THERAPY Evaluation (08/18/24 1258)  Patient Name:  [name]      Medical Record Number: [id number]   Date of Birth: 02-10-1971 Sex: Female    Post-Discharge Occupational Therapy Recommendations: Would benefit from assistance with IADLs/ADLs, 5x weekly, Low intensity  IADL/ADLS Needing Assistance: Feeding, Grooming, Bathing, Toileting, UB Dressing, LB Dressing, Managing Finances, Medication Management, Meal Preparation, Housekeeping, Tub/shower transfers    Equipment Recommendation OT DME Recommendations: Defer to post acute     OT Treatment Diagnosis: Cognitive changes due to medical disorder, Generalized muscle weakness, Limitation of activities due to disability, Need for assistance with personal care, Reduced mobility, Unsteadiness on feet      Current therapy recommendation: SNF for short-term rehab.  Assessment Assessment: Pt is a pleasant 55 year old female who initially presented to the hospital with medical dx of acute on chronic systolic heart failure but then had an acute CVA during the same admission. Pt was seen for OT evaluation in the ICU, where she had multiple family members (brother, aunt, and niece) at the bedside. They report that at baseline pt lives at home alone, has support from her son during the day (8-9 hours) as her caregiver but stays by herself at night, uses a power WC for mobility, and requires assist with ADLs and IADLs. Pt has a hx of B AKA and does not wear prosthetics at baseline. According to pt's brother, she is typically able to transfer herself from bed <> chair independently using a slideboard. Pt currently requires Total A with bed mobility and to maintain sitting balance due to her body system impairments in strength, balance, endurance, ROM, coordination, and cognition. Based on this assessment, pt has experienced a significant decline in her function and will benefit from skilled OT services in the acute care setting as well  as a higher level of care (short-term rehab//SNF) when she is discharged from the hospital in order to address the above mentioned impairments and reach her highest level of functioning and quality of life. At close of session, pt was repositioned supine in bed, HOB elevated, all needs met, all lines intact, bed alarm armed, family at the bedside, and RN aware. Thank you for this consult.  Problem List: Decreased activity tolerance, Decreased cognition, Decreased coordination, Decreased endurance, Decreased mobility, Decreased range of motion, Decreased strength, Impaired ADLs, Impaired balance, Impaired fine motor skills Personal Factors/Comorbidities (Occupational Profile and History Review): Expanded (Moderate) Assessment of Occupational Performance : Balance, Cognitive skills, Endurance, Fine or gross motor coordination, Mobility, Psychosocial skills, Strength Clinical Decision Making: Moderate Complexity  Skilled Interventions Performed: Balance activities, Bed mobility, Education - Patient, Education - Family / caregiver, Endurance activities, Postular / Proximal stability, Positioning, Range of motion, Other Today's Interventions: Initial OT evaluation.  Activity Tolerance During Today's Session Limited by fatigue, Limited by Mental Status, Limited secondary to medical complications  Plan Planned Frequency of Treatment: Plan of Care Initiated: 08/18/24 1-2x per day Weekly Frequency: 3-5 days per week Planned Treatment Duration: 09/01/24  Planned Interventions:  Cognitive Skills Development, Education (Patient/Family/Caregiver), Home Exercise Program, Neuromuscular Re-education, Self-Care/Home Training, Therapeutic Exercise, Therapeutic Activity    GOALS:  Patient and Family Goals: None stated at this time.  Short Term:  SHORT GOAL #1: Pt will increase RUE strength by 1 mm grade for improved ADLs and transfers in 2 weeks.  Time Frame : 2 weeks SHORT GOAL #2: Pt will increase LUE AROM  (shoulder flexion) to 90 degrees for participation in ADLs and transfers in  2 weeks.  Time Frame : 2 weeks SHORT GOAL #3: Pt will be mod A for bed mobility to promote independence with toileting, bathing, and dressing ADLs at the bed level in 2 weeks.  Time Frame : 2 weeks SHORT GOAL #4: Pt will be min A for grooming ADLs while seated at the EOB with no more than 1 VC to correct her sitting balance in 2 weeks.  Time Frame : 2 weeks  Long Term Goal #1: Pt will be min A for all aspects of toileting on the [facility] using AD / DME for support in 4 weeks. Time Frame: 4 weeks  Prognosis:  Fair Positive Indicators:  PLOF Barriers to Discharge: Cognitive deficits, Decreased caregiver support, Decreased range of motion, Endurance deficits, Functional strength deficits, Impaired Balance, Inability to safely perform ADLS, Language deficits, Poor insight into deficits, Severity of deficits, Time post onset  Subjective Medical Updates Since Last Visit/Relevant PMH Affecting Clinical Decision Making:   Prior Functional Status Per family: Pt lives in a 1 level (trailer) home alone, uses power WC for mobility, is ind. with slideboard transfers from bed <> chair, and requires assist with ADLs and IADLs.  Living Situation Living environment: Trailer/Mobile home Lives With: Alone Home Living: One level home, Ramped entrance, Tub/shower unit, Tub bench, Bedside commode Caregiver Identified?: Yes (Pt's son stays with pt for 8-9 hours per day as her caregiver but she is at home alone at night. Pt's brother also lives close by.) Caregiver Availability: Days Caregiver Ability: Assist with ADLs Caregiver Identified?: Yes (Pt's son) Equipment available at home: Bedside commode, Hospital Bed, Slide board, Land (Pt's family report that she does not wear oxygen at baseline.)  Medical Tests / Procedures: CT findings from 08/16/2024: Focal hypodensity in the right caudate body and adjacent white matter,  concerning for infarct, technically age-indeterminate. Consider MRI for further assessment.     Patient / Caregiver reports: Pt's family report that she needs a sleep apnea machine at home.   Past Medical History[1] Social History   Tobacco Use   Smoking status: Every Day    Current packs/day: 1.00    Types: Cigarettes   Smokeless tobacco: Never  Substance Use Topics   Alcohol  use: Never    Past Surgical History[2] Family History[3]   Patient has no allergy information on record.   Objective Findings Precautions / Restrictions  Falls precautions    Weight Bearing             Required Braces or Orthoses  Non-applicable  Pt's family report that she has 1 prosthetic leg at home but does not wear it.  Communication Preference  Verbal     Pain  No specific c/o pain at rest or with activity.  Equipment / Environment  Vascular access (PIV, TLC, Port-a-cath, PICC), Telemetry, Supplemental oxygen, Foley, NGT  Cognition  Orientation Level:  Disoriented to place, Disoriented to time, Disoriented to situation  Arousal/Alertness:  Localized responses  Attention Span:  Difficulty attending to directions  Memory:  Decreased recall of biographical information, Decreased recall of recent events, Decreased short term memory  Following Commands:  Follows commands inconsistently  Safety Judgment:  Decreased awareness of need for safety  Awareness of Errors and Problem Solving:  Assistance required to identify errors made, Assistance required to generate solutions, Assistance required to implement solutions  Comments:    Vision / Hearing  Vision: Wears glasses all the time, Glasses not present   Hearing: No deficit identified  Hand Function: Right Hand Function: Right hand function impaired Right Hand Impairment: grip strength fair, coordination impaired Left Hand Function: Left hand function impaired Left Hand Impairment: grip strength poor, ROM poor, coordination  impaired Hand Dominance: Left  Skin Inspection: Skin Inspection: Intact where visualized  Face/Cervical ROM: Face ROM: WFL Cervical ROM comments: L sided head turn preferred.  ROM / Strength: UE ROM/Strength: Left Impaired/Limited, Right Impaired/Limited RUE Impairment: Reduced strength, Limited AROM (2-/5 on MMT.) LUE Impairment: Reduced strength, Limited AROM (1/5 on MMT.) LE ROM/Strength: Left Impaired/Limited, Right Impaired/Limited RLE Impairment: Reduced strength, Limited AROM LLE Impairment: Reduced strength, Limited AROM LE ROM/ Strength Comment: Pt has hx of B AKA. Please defer to PT initial evaluation note from this date for a more detailed assessment.  Coordination: Coordination: Impaired, Absent Coordination comment: R hand - impaired//L hand - absent (functional observation of [facility])  Sensation: Sensory/ Proprioception/ Stereognosis comments: Not tested.  Balance: Static Sitting-Level of Assistance: Dependent Dynamic Sitting-Level of Assistance: Dependent  Static Standing-Level of Assistance: Unable to assess Dynamic Standing - Level of Assistance: Unable to assess Standing Balance comments: Pt does not stand at baseline.  Functional Mobility Transfers:  (Unable to assess) Bed Mobility : Total assist (x2 for safety)  Ambulation Level of Assistance: Other (Comment) (Pt does not ambulate at baseline.)  ADLs Feeding :  (NGT just placed per discussion with pt's nurse.) Grooming: Total Assist (Pt's Aunt requested to brush pt's hair while she was sitting up at the EOB.) Bathing: Total Assist, Performed at bed level Toileting: Total Assist, Performed at bed level (Per pt's brother, she typically uses a bedpan for toileting at baseline but also has a BSC available at home.) UB Dressing: Total Assist, Performed at bed level LB Dressing: Total Assist, Performed at bed level  IADLs: Pt has assist from family with IADLs at baseline.     Patient at end of session:  All needs in reach, Alarm activated, Friends/Family present, In bed, Lines intact, Notified Nurse, Staff present (Imaging staff entering the room.)   Occupational Therapy Session Duration OT Individual [mins]: 11 OT Co-Treatment [mins]: 21 Reason for Co-treatment: Requires heavy assist for safety         I attest that I have reviewed the above information. Signed: Vernell FORBES Bound, OT Filed 08/18/2024           [1] Past Medical History: Diagnosis Date   CHF (congestive heart failure) (CMS-HCC)    COPD (chronic obstructive pulmonary disease) (CMS-HCC)    Diabetes mellitus (CMS-HCC)    Hypertension    Nonischemic cardiomyopathy    (CMS-HCC)    Premature ventricular contractions    26% by [employer]   Renal disorder    Stage 3 CKD   Tobacco use   [2] Past Surgical History: Procedure Laterality Date   APPENDECTOMY     BACK SURGERY     Bilat leg amputation     CESAREAN SECTION     CHOLECYSTECTOMY    [3] History reviewed. No pertinent family history. "

## 2024-08-18 NOTE — Progress Notes (Signed)
 Patient is awake, tracking, following simple commands such as squeezing right hand and smiling. She is talking as well with a slur. She was able to tell me her name, DOB, and her sisters name as well. Patients sister [name] called and updated. Dr. Heath was at bedside and saw this as well. She discussed placing NG tube if patient continues to improve.

## 2024-08-18 NOTE — Nursing Note (Signed)
 Patient is awake, tracking, following simple commands. She is talking as well with a slur. She was able to tell me her name, DOB.Transafer to med- surg report give for Miss Kimberlee LPN.

## 2024-08-18 NOTE — Progress Notes (Signed)
 " PROGRESS NOTE [facility] 08/18/2024    Patient name: Areej Tayler DOB February 23, 1971 MRN#: [id number] PCP: Skillman, Katherine E, [facility] Time: 1:04 PM Primary Care Provider:  Jeanette Comer BRAVO, [facility] Inpatient primary attending provider: Chiquita Earnie Richards, MD   Hospital Course: 08/15/24 Admitted for volume overload, elevated BNP 70,000.  Pleural effusions and mild pulmonary edema on imaging.  Blood sugar 400s.  _____________________________________  CHIEF COMPLAINT: nausea, vomiting, abdominal pain, low urine output   Subjective: Patient is more alert today, able to open eyes and track.  She is able to answer her name and a couple simple questions.  She is following commands with her right arm.  I had a long discussion with family at bedside who are eager to see how much she could potentially recover.  They understand her prognosis remains guarded but we will pursue full medical care at this time.  Placing NG tube.   Assessment and Plan:   # Suspect Acute ischemic CVA, right caudate Patient with decreasing verbal responsiveness on presentation and developed unresponsiveness and left arm hemiparesis on 1/21. CTH shows focal hypodensity in the right caudate body and adjacent white matter concerning for infarct, age-indeterminate. Recent echo showed no clot, bubble study not done. Lower suspicion for seizure, CK normal. LDL 119 on statin. TSH 5.105 non-contributory. Urine culture with mixed flora. Carotid US  bilateral, no critical stenosis. MRA head and carotid US  show no large vessel occlusion. Was unresponsive with multiple organ failure, briefly on comfort care, now waking up some and improving. - ICU - Code stroke was called, appreciate neurology recommendations, started keppra 1g BID, stopped while comfort care, may need to transfer for EEG if MRI does not show acute stroke - MRI brain to further evaluate caudate lesion, discussed with family risk of aspiration laying flat for  an extended time given she is already on CVA treatment, and they would like to defer but may revisit if improves - NPO per SLP - insert NG tube, will likely need PEG tube - Hold off on resuming Keppra given improving mental status off of it - aspirin , plavix , atorvastatin  - Hold Xanax  and gabapentin  - PT/OT eval when able    # AHRF # Acute on chronic HFrEF # R>L pleural effusions Presented with n/v (gut edema?), BNP 70K, pulmonary edema. EF 35-40%, pericardial effusion resolved on limited repeat echo.  Recent echo 1/12 with global hypokinesis, RV dysfunction, small pericardial effusion, moderate MR, moderate AAS, moderate AR. Thoracentesis deferred on presentation as not hypoxic.  - O2 as needed, on HFNC 10L - airway clearance per RT, nebulizers - hold further lasix  pending renal recovery - Hold spironolactone and Entresto , continue Jardiance  - follows [facility] cardiology   # Elevated troponin Up to 254. Demand ischemia suspected due to above per cardiology. - Aspirin , Plavix , statin   # PVCs - continue propanolol - May resume mexiletine per cardiology - follows with EP at [facility]   # Oliguirc AKI on CKD3b Baseline Cr appears 1.4-2.2.  Developed oliguria, metabolic acidosis, hyperkalemia mild.  Urine output appears to with increased. -Check labs -Avoid nephrotoxic meds and monitor urine output closely   # CAD # Severe PAD # Bilateral AKA -Aspirin , Plavix , statin   # Diabetes, uncontrolled Hba1c 10.1 - hold lantus  - SSI   # Toabcco dependence  DVT ppx - subcutaneous heparin    CODE status DNR/ DNR   Dispo: - PT and OT evaluation _____________________________________  Temp:  [36.4 C (97.5 F)-38.2 C (100.8 F)] 36.4 C (97.5  F) Pulse:  [80-93] 93 SpO2 Pulse:  [28-94] 88 Resp:  [10-26] 20 BP: (118-187)/(47-85) 157/48 SpO2:  [96 %-100 %] 96 % Body mass index is 27.15 kg/m. Intake/Output last 3 shifts: I/O last 3 completed shifts: In: 182.9 [I.V.:30; IV  Piggyback:152.9] Out: 575 [Urine:575]  Physical Exam  Constitutional: She appears acutely ill.  Cardiovascular: Normal rate and regular rhythm.  Pulmonary/Chest: Breath sounds normal.  Abdominal: Soft. There is no abdominal tenderness.  Musculoskeletal:     Cervical back: Neck supple.     Comments: Bilateral AKA  Neurological:  Patient is alert, oriented to name, tracks, follows commands with right hand sqeeze, left arm flaccid Skin: Skin is warm and dry.  Consults Requested  IP CONSULT TO HOSPITALIST IP CONSULT TO NUTRITION SERVICES IP CONSULT TO CARDIOLOGY IP CONSULT TO SPIRITUAL CARE     In hospital Nutrition: NPO No Exceptions; Medically necessary    An advanced care planning discussion was not had with patient and/or patient's decisions maker (documented separately).  CODE STATUS :                    DNR and DNI   Discharge estimated within 2-3 days. Anticipated disposition:  To Skilled Nursing Facility ______________________________________________________________  Current Medications[1] ________________________________________________________________  Not on File   Past Medical History[2]  Past Surgical History[3]   Family History[4]        Imaging  ECG 12 Lead Result Date: 08/18/2024 Accelerated idioventricular rhythm alternating with sinus rhythm Abnormal ECG When compared with ECG of 16-Aug-2024 08:43, Accelerated idioventricular rhythm is present Confirmed by Sheng, Siyuan [phone]) on 08/18/2024 10:37:35 AM  ECG 12 Lead Result Date: 08/18/2024 Normal sinus rhythm Possible Left atrial enlargement Nonspecific T wave abnormality Abnormal ECG When compared with ECG of 15-Aug-2024 18:57, Vent. rate has decreased by  38 bpm Nonspecific T wave abnormality now evident in Anterior leads Confirmed by Sheng, Siyuan [phone]) on 08/18/2024 10:12:27 AM  ECG 12 Lead Result Date: 08/18/2024  Poor data quality, interpretation may be adversely affected Sinus tachycardia  Possible Left atrial enlargement Rightward axis Nonspecific ST and T wave abnormality Abnormal ECG When compared with ECG of 20-Aug-2023 23:28, Poor data quality in current ECG precludes serial comparison Confirmed by Sheng, Siyuan [phone]) on 08/18/2024 10:11:46 AM  XR Chest 1 view Result Date: 08/17/2024 Exam:  Chest Single Frontal View  History: Shortness of breath  Technique:  Single frontal view.  Comparison: CT of the chest without contrast 08/15/2024  Findings:   Stable enlarged cardiomediastinal silhouette and pulmonary arteries. Small pleural effusions. No pneumothorax. Bibasilar atelectasis.    Stable mild to moderate pulmonary edema.    Signed (Electronic Signature): 08/17/2024 6:18 AM Signed By: Sula Badger MD  PVL Carotid Duplex Bilateral Result Date: 08/16/2024 Exam:  Ultrasound of the Carotid Arteries  History: History of stroke. Evaluate for carotid stenosis.  Technique:  Real-time high-resolution ultrasound of both the left and right carotid and vertebral arteries was performed. In addition to the grayscale examination, the study was supplemented with color flow and Doppler interrogation.  Comparison:   None.  Velocity measurements and ratios:  RIGHT CAROTID DUPLEX DATA: ICA peak systolic velocity: 347 cm/sec ICA end diastolic velocity: 38.9 cm/sec  CCA peak systolic velocity: 75.8 cm/sec CCA end diastolic velocity: 13.7 cm/sec  Systolic ratio ICA/CCA: 4.58  LEFT CAROTID DUPLEX DATA: ICA peak systolic velocity: 154 cm/sec ICA end diastolic velocity: 3.5 cm/sec  CCA peak systolic velocity: 130 cm/sec CCA end diastolic velocity: 22.0 cm/sec  Systolic  ratio ICA/CCA: 1.18  COMMENT: All measurements of stenosis in the report above were calculated using the NASCET [facility] Symptomatic Carotid Endarterectomy Trial) methodology for measuring carotid artery stenosis, whereby the narrowest transverse measurement of luminal dimension at the point of the maximum stenosis is stated relative to the  diameter of the closest normal-appearing portion of the same vessel beyond the stenosis.  Findings: Difficult study secondary to difficulties in patient cooperation, and moving during the examination.  On the right, there is intimal thickening in the distal common carotid artery. Moderate noncalcified plaque in the proximal external carotid artery. No significant plaquing in the right internal carotid artery.  On the left, significant noncalcified plaque is present in the distal common carotid artery. Moderate plaquing is present in the proximal external carotid artery.  Antegrade flow within the vertebral arteries, on limited evaluation.    1.    Elevated peak systolic velocity in the right internal carotid artery, suggesting greater than 70% stenosis. 2.    Elevated peak systolic velocity in the left internal carotid artery, suggesting 50-69% stenosis. 3.    Antegrade flow within the vertebral arteries on limited evaluation.     Signed (Electronic Signature): 08/16/2024 4:10 PM Signed By: Deward DELENA Brock, MD  MRA Head Wo Contrast Result Date: 08/16/2024 Exam: MRA circle of Willis.  History: 54 year old. Stroke.  Comparison: Head CT. August 16, 2024.  Technique: Standard MRA circle of Willis performed..  Findings:  Examination compromised by motion artifact. With this in mind, no large vessel occlusion is seen. No evidence of aneurysm formation around the circle of Willis. There are large caliber posterior communicating arteries bilaterally. There is  aplasia of the P1 segments of both posterior cerebral arteries. This leads to  small caliber of the intracranial vertebrobasilar system.     1. Intracranial MRA within normal limits. Examination mildly limited by patient motion.  Signed (Electronic Signature): 08/16/2024 3:51 PM Signed By: Fairy KATHEE Shipper, MD  CT Head Wo Contrast Result Date: 08/16/2024 Exam:  CT Head without Contrast  History: Not responsive, altered mental status  Technique: Routine brain CT  without IV contrast. AEC (automated exposure control) and/or manual techniques such as size-specific kV and mAs are employed where appropriate to reduce radiation exposure for all CT exams.  Comparison:  None.  Findings: Ventricles and sulci are appropriate for the patient's age. There is focal hypodensity in the right caudate body and adjacent white matter. This is ill-defined. Significant motion artifact is noted. Focal hypodensity in the left thalamus appears more chronic. No mass effect, midline shift, or extra-axial fluid collection. No intracranial hemorrhage. Basal cisterns are patent. The globes are intact bilaterally. Paranasal sinuses and mastoid air cells are clear.    Focal hypodensity in the right caudate body and adjacent white matter, concerning for infarct, technically age-indeterminate. Consider MRI for further assessment.  Findings were communicated to Augustin Cecil, RN, at 10:10 a.m. on 08/16/2024.  Signed (Electronic Signature): 08/16/2024 10:13 AM Signed By: Dorothyann Jointer, MD  Echocardiogram Follow Up/Limited Echo Result Date: 08/15/2024 Patient Info Name:     Lillyan Hitson Age:     53 years DOB:     December 29, 1970 Gender:     Female MRN:     [id number] Accession #:     [id number] [facility] Account #:     [id number] Ht:     152 cm Wt:     62 kg BSA:     1.63 m2 BP:     145 /  86 mmHg HR:     94 bpm Exam Date:     08/15/2024 3:05 PM Admit Date:     08/15/2024 Exam Type:     ECHOCARDIOGRAM FOLLOW UP/LIMITED ECHO Technical Quality:     Good Staff Sonographer:     Mliss Gate Supervising Physician:     Earla Maude Currier MD Ordering Physician:     Brutus Bryant Pace Study Info Indications      - BNP > 70000 Procedure(s)   Limited 2D transthoracic echocardiogram is performed. Summary   1. The left ventricle is moderately dilated in size with mildly increased wall thickness.   2. The left ventricular systolic function is moderately decreased, LVEF is visually estimated at 35-40%.   3. The  left atrium is dilated in size.   4. The right ventricle is mildly dilated in size, with mildly reduced systolic function.   5. The right atrium is dilated in size.   6. Valve function was not adequately assessed on limited echo. Left Ventricle   The left ventricle is moderately dilated in size with mildly increased wall thickness. The left ventricular systolic function is moderately decreased, LVEF is visually estimated at 35-40%. Left ventricular diastolic function cannot be accurately assessed. Right Ventricle   The right ventricle is mildly dilated in size, with mildly reduced systolic function. Left Atrium   The left atrium is dilated in size. Right Atrium   The right atrium is dilated in size. Aortic Valve   The aortic valve is not well visualized. Mitral Valve   The mitral valve leaflets are normal with normal leaflet mobility. Tricuspid Valve   The tricuspid valve leaflets are normal, with normal leaflet mobility. Aorta   The aorta is normal in size in the visualized segments. Inferior Vena Cava   The IVC is not well visualized precluding the ability to accurate assess right atrial pressure. Pericardium/Pleural   There is no pericardial effusion. Ventricles ---------------------------------------------------------------------- Name                                 Value        Normal ---------------------------------------------------------------------- LV Dimensions 2D/MM ----------------------------------------------------------------------  IVS Diastolic Thickness (2D)                                1.1 cm       0.6-0.9 LVID Diastole (2D)                  5.9 cm       3.8-5.2  LVPW Diastolic Thickness (2D)                                1.1 cm       0.6-0.9 LVID Systole (2D)                   4.6 cm       2.2-3.5 LV Mass Index (2D Cubed)          166 g/m2         43-95  Relative Wall Thickness (2D)                                  0.37        <=  0.42 LV Function  ---------------------------------------------------------------------- LV EF (4C MOD)                        34 %                LV Diastolic Volume Index (BP MOD)                        61.8 ml/m2     29.0-61.0 LV EF (BP MOD)                        30 %         54-74 RV Dimensions 2D/MM ----------------------------------------------------------------------  RV Basal Diastolic Dimension                           4.3 cm       2.5-4.1 Aorta ---------------------------------------------------------------------- Name                                 Value        Normal ---------------------------------------------------------------------- Ascending Aorta ---------------------------------------------------------------------- Ao Root Diameter (2D)               2.2 cm               Ao Root Diam Index (2D)          1.3 cm/m2 Report Signatures Finalized by Earla Maude Currier  MD on 08/15/2024 04:23 PM  CT chest without contrast Result Date: 08/15/2024 Exam: CT of the Chest without Contrast  History: Right flank pain. Chronic kidney disease. Diabetes. Last insulin  taken yesterday morning.  Technique: Contiguous axial images were obtained through the chest without intravenous contrast. Multiplanar and maximum intensity projection axial reformats were obtained. AEC (automated exposure control) and/or manual techniques such as size-specific kV and mAs are employed where appropriate to reduce radiation exposure for all CT exams.  Comparison: AP chest 08/15/2024, chest 2 views 08/20/2023, CTA chest 02/09/2012  Findings: LUNGS: Mild to moderate motion artifact. The central airways are patent. Moderate right and mild to moderate left pleural effusions with associated mild submental atelectasis. Segmental atelectasis within the inferior medial right middle lobe. Bilateral scattered groundglass opacities. No pneumothorax.  HEART: Heart size is moderately enlarged, mildly increased from 02/09/2012. Trace pericardial fluid. Moderate to  severe coronary artery calcifications.  GREAT VESSELS: The main pulm artery measures up to 3.3 cm, newly enlarged as can be seen with chronic pulmonary arterial hypertension. No thoracic aortic aneurysm. Mild to moderate atherosclerotic calcifications.  MEDIASTINUM: No lymphadenopathy.  BONES: Mild multilevel degenerative disc changes of the thoracic spine, greatest at the anterior, mid to superior thoracic spine.  SOFT TISSUES: Mild subcutaneous fat edema diffusely, likely mild anasarca.  UPPER ABDOMEN: Limited view of the upper abdomen is unremarkable.    1.    Moderate right and mild to moderate left pleural effusions with associated mild atelectasis. 2.    Bilateral scattered groundglass opacities, possibly subsegmental atelectasis versus mild pulmonary edema. 3.    Moderate cardiomegaly, mildly increased compared to 2013. Moderate to severe coronary artery calcifications. 4.    Mild enlargement of the main pulmonary artery as can be seen with chronic pulmonary arterial hypertension.   Signed (Electronic Signature): 08/15/2024 2:13 PM Signed By: Tanda Lyons  CT Abdomen Pelvis Wo Contrast Result Date: 08/15/2024  Exam: CT of the Abdomen and Pelvis without Contrast  History: 54 year old female with right flank pain  Technique: Routine CT of the abdomen and pelvis without IV contrast. AEC (automated exposure control) and/or manual techniques such as size-specific kV and mAs are employed where appropriate to reduce radiation exposure for all CT exams.  Comparison: 08/20/2023 CT  Findings: LOWER CHEST: Moderate right and small left pleural effusions. Cardiomegaly. Partially visualized groundglass in the lower lobes with passive atelectasis.  HEPATOBILIARY: Normal  SPLEEN: Normal.  PANCREAS: Normal.  ADRENALS: Normal.  KIDNEYS: No hydronephrosis. Right renal atrophy.  GASTROINTESTINAL: No abnormally dilated or thickened loops of bowel. Small volume ascites. No pneumoperitoneum.  PELVIC ORGANS: Small volume free  fluid in the pelvis.  LYMPH NODES: No lymphadenopathy.  VASCULATURE: Calcified atherosclerosis, normal caliber abdominal aorta. Right femoral bypass graft is poorly evaluated without contrast.  SOFT TISSUES: Small fat-containing right groin hernia.  BONES: No aggressive lesion. No acute abnormality.    1.    No acute findings in the abdomen or pelvis. 2.    Cardiomegaly with fluid overload. Moderate right and small left pleural effusions with small volume ascites.    Signed (Electronic Signature): 08/15/2024 2:03 PM Signed By: Carlin Essex, MD  XR Chest Portable Result Date: 08/15/2024 Exam:  Portable Chest  History: Cough  Technique:  Single frontal view.  Comparison: Chest radiograph dated 08/20/2023  Findings:    Diffuse, mild interstitial opacities.  Bibasilar opacities. Small bilateral pleural effusions. No pneumothorax.  The cardiomediastinal silhouette is unchanged.     *    Probable mild pulmonary edema with small bilateral pleural effusions. *    Bibasilar opacities, atelectasis versus infection.    Signed (Electronic Signature): 08/15/2024 9:41 AM Signed By: Reyes Going, MD   Lab Results   Recent Labs    08/18/24 1249  WBC 11.2*  HGB 11.4*  HCT 35.4  PLT 191   Recent Labs    08/16/24 0427 08/16/24 0854 08/16/24 1330 08/17/24 0518  NA 131*  --   --  137  K 4.7  --   --  5.1*  CL 104  --   --  104  CO2 20.4*  --   --  15.1*  BUN 31*  --   --  47*  CREATININE 2.15*  --   --  3.07*  GLU 67*  --   --  95  CALCIUM  8.8  --   --  8.6  ALBUMIN 3.4*  --   --  3.1*  PROT 7.0  --   --  6.7  BILITOT 0.8  --   --  0.9  AST 17  --   --  33  ALT 24  --   --  24  ALKPHOS 146*  --   --  138*  MG 1.9  --   --   --   PHOS  --   --   --  5.5*  TSH  --  5.105*  --   --   AMMONIA  --   --  10*  --    Recent Labs    08/16/24 0854 08/17/24 0518  CKTOTAL 81.0  --   TROPONINI  --  293*   No results for input(s): WBCUA, NITRITE, LEUKOCYTESUR, BACTERIA, RBCUA, BLOODU,  GLUCOSEU, PROTEINUA, KETONESU, KETUR in the last 72 hours. No results for input(s): OPIAU, BENZU, TRICYCLIC, PCPU, AMPHU, COCAU, CANNAU, BARBU, ETOH, ACETAMIN, SALICYLATE in the last 72 hours. No results for input(s): PREGTESTUR, PREGPOC  in the last 72 hours. Recent Labs    08/16/24 0854  CHOL 166  LDL 119*  HDL 36*  TRIG 66   Recent Labs    08/16/24 1017 08/17/24 0659  PHART 7.47* 7.32*  PCO2ART 19.5* 29.3*  PO2ART 80* 61*  HCO3ART 14.1*  --   O2SATART 96.4 88.0*  BEART -7.4*  --    Pending Labs     Order Current Status   Basic Metabolic Panel In process   Hepatic Function Panel In process   Phosphorus Level In process   Blood Culture Preliminary result   Blood Culture Preliminary result       Chiquita CHRISTELLA Richards, MD Hospitalist, [facility] 08/18/2024, 1:04 PM      [1]  Current Facility-Administered Medications:    acetaminophen  (TYLENOL ) tablet 650 mg, 650 mg, Enteral tube: gastric, Q4H PRN, Richards Chiquita Jansky, MD   acetaminophen  (TYLENOL ) tablet 650 mg, 650 mg, Enteral tube: gastric, Q4H PRN, Richards Chiquita Jansky, MD   albuterol  2.5 mg /3 mL (0.083 %) nebulizer solution 2.5 mg, 2.5 mg, Nebulization, Q4H PRN, Pace, Hagan Eggleston, FNP   albuterol  2.5 mg /3 mL (0.083 %) nebulizer solution 2.5 mg, 2.5 mg, Nebulization, Q6H While awake (RT), Richards Chiquita Jansky, MD   aluminum-magnesium  hydroxide-simethicone  (MAALOX MAX) 80-80-8 mg/mL oral suspension, 30 mL, Enteral tube: gastric, Q4H PRN, Richards Chiquita Jansky, MD   guaiFENesin  [facility]) oral syrup, 200 mg, Oral, Q4H PRN, Richards Chiquita Jansky, MD   heparin  (porcine) 5,000 unit/mL injection 5,000 Units, 5,000 Units, Subcutaneous, Q12H SCH, Richards Chiquita Jansky, MD   melatonin tablet 3 mg, 3 mg, Enteral tube: gastric, Nightly PRN, Richards Chiquita Jansky, MD   ondansetron  (ZOFRAN ) injection 4 mg, 4 mg, Intravenous, Q8H PRN **OR** ondansetron  (ZOFRAN ) injection 8 mg, 8 mg,  Intravenous, Q8H PRN, Richards Chiquita Jansky, MD   oxyCODONE  (ROXICODONE ) immediate release tablet 10 mg, 10 mg, Enteral tube: gastric, Q4H PRN, Richards Chiquita Jansky, MD   oxyCODONE  (ROXICODONE ) immediate release tablet 5 mg, 5 mg, Enteral tube: gastric, Q4H PRN, Richards Chiquita Jansky, MD   polyethylene glycol (MIRALAX ) packet 17 g, 17 g, Enteral tube: gastric, Daily PRN, Richards Chiquita Jansky, MD   senna NALANI) tablet 2 tablet, 2 tablet, Enteral tube: gastric, Nightly PRN, Richards Chiquita Jansky, MD [2] Past Medical History: Diagnosis Date   CHF (congestive heart failure) (CMS-HCC)    COPD (chronic obstructive pulmonary disease) (CMS-HCC)    Diabetes mellitus (CMS-HCC)    Hypertension    Nonischemic cardiomyopathy    (CMS-HCC)    Premature ventricular contractions    26% by [employer]   Renal disorder    Stage 3 CKD   Tobacco use   [3] Past Surgical History: Procedure Laterality Date   APPENDECTOMY     BACK SURGERY     Bilat leg amputation     CESAREAN SECTION     CHOLECYSTECTOMY    [4] History reviewed. No pertinent family history. "

## 2024-08-19 ENCOUNTER — Inpatient Hospital Stay (HOSPITAL_COMMUNITY)

## 2024-08-19 ENCOUNTER — Encounter (HOSPITAL_COMMUNITY): Payer: Self-pay

## 2024-08-19 ENCOUNTER — Inpatient Hospital Stay (HOSPITAL_COMMUNITY)
Admission: EM | Admit: 2024-08-19 | Source: Other Acute Inpatient Hospital | Attending: Internal Medicine | Admitting: Internal Medicine

## 2024-08-19 DIAGNOSIS — I10 Essential (primary) hypertension: Secondary | ICD-10-CM | POA: Diagnosis present

## 2024-08-19 DIAGNOSIS — I110 Hypertensive heart disease with heart failure: Secondary | ICD-10-CM

## 2024-08-19 DIAGNOSIS — I5023 Acute on chronic systolic (congestive) heart failure: Secondary | ICD-10-CM | POA: Diagnosis present

## 2024-08-19 DIAGNOSIS — J449 Chronic obstructive pulmonary disease, unspecified: Secondary | ICD-10-CM | POA: Diagnosis present

## 2024-08-19 DIAGNOSIS — E785 Hyperlipidemia, unspecified: Secondary | ICD-10-CM

## 2024-08-19 DIAGNOSIS — J9601 Acute respiratory failure with hypoxia: Principal | ICD-10-CM | POA: Insufficient documentation

## 2024-08-19 DIAGNOSIS — N289 Disorder of kidney and ureter, unspecified: Secondary | ICD-10-CM | POA: Diagnosis not present

## 2024-08-19 DIAGNOSIS — R29706 NIHSS score 6: Secondary | ICD-10-CM | POA: Diagnosis not present

## 2024-08-19 DIAGNOSIS — I5033 Acute on chronic diastolic (congestive) heart failure: Secondary | ICD-10-CM | POA: Diagnosis present

## 2024-08-19 DIAGNOSIS — N1832 Chronic kidney disease, stage 3b: Secondary | ICD-10-CM | POA: Diagnosis present

## 2024-08-19 DIAGNOSIS — I6340 Cerebral infarction due to embolism of unspecified cerebral artery: Secondary | ICD-10-CM | POA: Diagnosis not present

## 2024-08-19 DIAGNOSIS — Z89512 Acquired absence of left leg below knee: Secondary | ICD-10-CM | POA: Diagnosis present

## 2024-08-19 DIAGNOSIS — I509 Heart failure, unspecified: Secondary | ICD-10-CM | POA: Diagnosis not present

## 2024-08-19 DIAGNOSIS — Z89511 Acquired absence of right leg below knee: Secondary | ICD-10-CM

## 2024-08-19 DIAGNOSIS — N179 Acute kidney failure, unspecified: Secondary | ICD-10-CM

## 2024-08-19 DIAGNOSIS — I5021 Acute systolic (congestive) heart failure: Secondary | ICD-10-CM | POA: Diagnosis present

## 2024-08-19 DIAGNOSIS — E1169 Type 2 diabetes mellitus with other specified complication: Secondary | ICD-10-CM | POA: Diagnosis present

## 2024-08-19 LAB — COMPREHENSIVE METABOLIC PANEL WITH GFR
ALT: 38 U/L (ref 0–44)
AST: 31 U/L (ref 15–41)
Albumin: 3.1 g/dL — ABNORMAL LOW (ref 3.5–5.0)
Alkaline Phosphatase: 174 U/L — ABNORMAL HIGH (ref 38–126)
Anion gap: 19 — ABNORMAL HIGH (ref 5–15)
BUN: 98 mg/dL — ABNORMAL HIGH (ref 6–20)
CO2: 13 mmol/L — ABNORMAL LOW (ref 22–32)
Calcium: 8 mg/dL — ABNORMAL LOW (ref 8.9–10.3)
Chloride: 108 mmol/L (ref 98–111)
Creatinine, Ser: 3.96 mg/dL — ABNORMAL HIGH (ref 0.44–1.00)
GFR, Estimated: 13 mL/min — ABNORMAL LOW
Glucose, Bld: 259 mg/dL — ABNORMAL HIGH (ref 70–99)
Potassium: 4.5 mmol/L (ref 3.5–5.1)
Sodium: 139 mmol/L (ref 135–145)
Total Bilirubin: 0.5 mg/dL (ref 0.0–1.2)
Total Protein: 5.5 g/dL — ABNORMAL LOW (ref 6.5–8.1)

## 2024-08-19 LAB — CBC WITH DIFFERENTIAL/PLATELET
Abs Immature Granulocytes: 0.09 10*3/uL — ABNORMAL HIGH (ref 0.00–0.07)
Basophils Absolute: 0 10*3/uL (ref 0.0–0.1)
Basophils Relative: 0 %
Eosinophils Absolute: 0 10*3/uL (ref 0.0–0.5)
Eosinophils Relative: 0 %
HCT: 34.6 % — ABNORMAL LOW (ref 36.0–46.0)
Hemoglobin: 11.1 g/dL — ABNORMAL LOW (ref 12.0–15.0)
Immature Granulocytes: 1 %
Lymphocytes Relative: 14 %
Lymphs Abs: 1.6 10*3/uL (ref 0.7–4.0)
MCH: 30.6 pg (ref 26.0–34.0)
MCHC: 32.1 g/dL (ref 30.0–36.0)
MCV: 95.3 fL (ref 80.0–100.0)
Monocytes Absolute: 1.1 10*3/uL — ABNORMAL HIGH (ref 0.1–1.0)
Monocytes Relative: 9 %
Neutro Abs: 8.8 10*3/uL — ABNORMAL HIGH (ref 1.7–7.7)
Neutrophils Relative %: 76 %
Platelets: 154 10*3/uL (ref 150–400)
RBC: 3.63 MIL/uL — ABNORMAL LOW (ref 3.87–5.11)
RDW: 16.3 % — ABNORMAL HIGH (ref 11.5–15.5)
WBC: 11.5 10*3/uL — ABNORMAL HIGH (ref 4.0–10.5)
nRBC: 0 % (ref 0.0–0.2)

## 2024-08-19 LAB — GLUCOSE, CAPILLARY
Glucose-Capillary: 227 mg/dL — ABNORMAL HIGH (ref 70–99)
Glucose-Capillary: 213 mg/dL — ABNORMAL HIGH (ref 70–99)

## 2024-08-19 LAB — PHOSPHORUS: Phosphorus: 6.1 mg/dL — ABNORMAL HIGH (ref 2.5–4.6)

## 2024-08-19 LAB — LACTIC ACID, PLASMA
Lactic Acid, Venous: 1.3 mmol/L (ref 0.5–1.9)
Lactic Acid, Venous: 1.2 mmol/L (ref 0.5–1.9)

## 2024-08-19 LAB — PRO BRAIN NATRIURETIC PEPTIDE: Pro Brain Natriuretic Peptide: >35000 pg/mL — ABNORMAL HIGH

## 2024-08-19 LAB — MAGNESIUM: Magnesium: 2.3 mg/dL (ref 1.7–2.4)

## 2024-08-19 MED ORDER — ROSUVASTATIN CALCIUM 20 MG PO TABS: 40.0000 mg | ORAL_TABLET | Freq: Every evening | ORAL | Status: DC

## 2024-08-19 MED ORDER — ASPIRIN 81 MG PO CHEW
81.0000 mg | CHEWABLE_TABLET | Freq: Every day | ORAL | Status: DC
  Administered 2024-08-20: 81 mg
  Filled 2024-08-19 (×3): qty 1

## 2024-08-19 MED ORDER — HEPARIN SODIUM (PORCINE) 5000 UNIT/ML IJ SOLN
5000.0000 [IU] | Freq: Three times a day (TID) | INTRAMUSCULAR | Status: AC
  Administered 2024-08-19 – 2024-09-01 (×40): 5000 [IU] via SUBCUTANEOUS
  Filled 2024-08-19 (×39): qty 1

## 2024-08-19 MED ORDER — INSULIN ASPART 100 UNIT/ML IJ SOLN
0.0000 [IU] | Freq: Three times a day (TID) | INTRAMUSCULAR | Status: DC
  Administered 2024-08-20 (×2): 3 [IU] via SUBCUTANEOUS
  Administered 2024-08-20: 2 [IU] via SUBCUTANEOUS
  Administered 2024-08-21 – 2024-08-22 (×2): 3 [IU] via SUBCUTANEOUS
  Administered 2024-08-23: 8 [IU] via SUBCUTANEOUS
  Administered 2024-08-23: 2 [IU] via SUBCUTANEOUS
  Administered 2024-08-23: 3 [IU] via SUBCUTANEOUS
  Administered 2024-08-24 – 2024-08-25 (×5): 5 [IU] via SUBCUTANEOUS
  Filled 2024-08-19 (×3): qty 3
  Filled 2024-08-19: qty 5
  Filled 2024-08-19: qty 4
  Filled 2024-08-19: qty 3
  Filled 2024-08-19: qty 15
  Filled 2024-08-19: qty 5
  Filled 2024-08-19: qty 8
  Filled 2024-08-19: qty 2
  Filled 2024-08-19: qty 5
  Filled 2024-08-19: qty 2
  Filled 2024-08-19: qty 15
  Filled 2024-08-19: qty 3

## 2024-08-19 MED ORDER — SODIUM CHLORIDE 0.9 % IV BOLUS
500.0000 mL | Freq: Once | INTRAVENOUS | Status: AC
  Administered 2024-08-19: 500 mL via INTRAVENOUS

## 2024-08-19 MED ORDER — ACETAMINOPHEN 325 MG PO TABS
650.0000 mg | ORAL_TABLET | Freq: Four times a day (QID) | ORAL | Status: DC | PRN
  Administered 2024-08-21 – 2024-08-27 (×5): 650 mg via ORAL
  Filled 2024-08-19 (×5): qty 2

## 2024-08-19 MED ORDER — CLOPIDOGREL BISULFATE 75 MG PO TABS
75.0000 mg | ORAL_TABLET | Freq: Every day | ORAL | Status: DC
  Administered 2024-08-20: 75 mg
  Filled 2024-08-19 (×3): qty 1

## 2024-08-19 MED ORDER — ONDANSETRON HCL 4 MG/2ML IJ SOLN
4.0000 mg | Freq: Four times a day (QID) | INTRAMUSCULAR | Status: AC | PRN
  Administered 2024-08-31: 4 mg via INTRAVENOUS
  Filled 2024-08-19: qty 2

## 2024-08-19 MED ORDER — CLOPIDOGREL BISULFATE 75 MG PO TABS: 75.0000 mg | ORAL_TABLET | Freq: Every day | ORAL | Status: DC

## 2024-08-19 MED ORDER — STERILE WATER FOR INJECTION IV SOLN
INTRAVENOUS | Status: DC
  Filled 2024-08-19 (×2): qty 1000

## 2024-08-19 MED ORDER — ONDANSETRON HCL 4 MG PO TABS: 4.0000 mg | ORAL_TABLET | Freq: Four times a day (QID) | ORAL | Status: AC | PRN

## 2024-08-19 MED ORDER — STROKE: EARLY STAGES OF RECOVERY BOOK
Freq: Once | Status: AC
  Filled 2024-08-19: qty 1

## 2024-08-19 MED ORDER — INSULIN ASPART 100 UNIT/ML IJ SOLN
0.0000 [IU] | Freq: Every day | INTRAMUSCULAR | Status: DC
  Administered 2024-08-19: 2 [IU] via SUBCUTANEOUS
  Administered 2024-08-23 – 2024-08-24 (×2): 3 [IU] via SUBCUTANEOUS
  Administered 2024-08-25: 4 [IU] via SUBCUTANEOUS
  Filled 2024-08-19: qty 3
  Filled 2024-08-19: qty 4
  Filled 2024-08-19: qty 3
  Filled 2024-08-19: qty 2

## 2024-08-19 MED ORDER — ACETAMINOPHEN 650 MG RE SUPP: 650.0000 mg | Freq: Four times a day (QID) | RECTAL | Status: DC | PRN

## 2024-08-19 MED ORDER — HYDRALAZINE HCL 20 MG/ML IJ SOLN
10.0000 mg | Freq: Four times a day (QID) | INTRAMUSCULAR | Status: DC | PRN
  Administered 2024-08-21: 10 mg via INTRAVENOUS
  Filled 2024-08-19: qty 1

## 2024-08-19 MED ORDER — ALBUTEROL SULFATE (2.5 MG/3ML) 0.083% IN NEBU: 2.5000 mg | INHALATION_SOLUTION | RESPIRATORY_TRACT | Status: AC | PRN

## 2024-08-19 MED ORDER — ROSUVASTATIN CALCIUM 20 MG PO TABS
40.0000 mg | ORAL_TABLET | Freq: Every evening | ORAL | Status: DC
  Administered 2024-08-20: 40 mg
  Filled 2024-08-19 (×2): qty 2

## 2024-08-19 MED ORDER — ASPIRIN 81 MG PO TBEC: 81.0000 mg | DELAYED_RELEASE_TABLET | Freq: Every day | ORAL | Status: DC

## 2024-08-19 NOTE — Progress Notes (Incomplete)
 Patient found by primary RN without NGT in place.  31F NGT replaced to right nare with Mindy, RRT at bedside and verified placement.

## 2024-08-19 NOTE — Nursing Note (Addendum)
------------------------------------------------------------------------------- °  Summary: Renal Dose Adjustment:  Zosyn -------------------------------------------------------------------------------  Renally adjusted Zosyn 3.375 grams IV Q8H extended-infusion to Q12H extended-infusion for estimated CrCl < 20 ml/min per the Pharmacist- Initiated Dose Change Policy.

## 2024-08-19 NOTE — Consult Note (Signed)
 "  Cardiology Consultation   Patient ID: DENIYA CRAIGO MRN: [id number]; DOB: [birth year]  Admit date: 08/19/2024 Date of Consult: 08/20/2024  PCP:  Jeanette Comer BRAVO, PA-C   [facility] Providers Cardiologist:  Jayson Sierras, MD  Electrophysiologist:  Eulas BRAVO Furbish, MD       Patient Profile: [name] is a 54 y.o. female with a hx of heart failure with reduced ejection fraction, moderate low-flow low gradient aortic stenosis, eccentric moderate mitral regurgitation, high burden PVCs, CKD 3, hyperlipidemia type 2 diabetes, pulmonary embolism, COPD, peripheral arterial disease status post bilateral above-the-knee amputations who is being seen 08/20/2024 for the evaluation of heart failure at the request of [facility]ists.  History of Present Illness: Ms. Brys is unable to answer any questions due to recent CVA.  By chart review and discussion with accepting hospitalist, she was admitted with acute on chronic heart failure with volume overload by exam and imaging, BNP 70,000, and altered mental status to [facility] on 08/16/23. CT scan showed evidence of new CVA. Her troponin was peaked at 293.  Cardiology was consulted and felt the elevated troponin was related to demand ischemia.  Her respiratory status worsened to requiring high flow nasal cannula 12 L and there was concern for a minute death.  The patient was transition to DNR/DNI CODE STATUS with plan to pursue comfort care.  Her clinical status stabilized overnight on 1/23.  But by 1/24 her urine output decreased and her labs showed a worsening AKI with creatinine of 4.22.  She was transferred to [facility] for further evaluation and management.  Past Medical History:  Diagnosis Date   Anemia 02/05/2012   Anxiety    Arthritis    Asthma    B12 deficiency    CKD (chronic kidney disease) stage 3, GFR 30-59 ml/min (HCC) 09/25/2021   COPD (chronic obstructive pulmonary disease) (HCC)    Depression     Diabetes (HCC)    Elevated LFTs    Essential hypertension    Fatty liver    Folate deficiency    GERD (gastroesophageal reflux disease)    Headache(784.0)    Heart failure (HCC)    PAD (peripheral artery disease)    Status post bilateral AKA   PE (pulmonary embolism)    Xarelto   Pneumonia 2013   PVC (premature ventricular contraction)    Sepsis (HCC)    Type 2 diabetes mellitus (HCC)    Vitamin D deficiency     Past Surgical History:  Procedure Laterality Date   ABDOMINAL AORTOGRAM W/LOWER EXTREMITY N/A 09/05/2021   Procedure: ABDOMINAL AORTOGRAM W/LOWER EXTREMITY;  Surgeon: Magda Debby SAILOR, MD;  Location: [facility] INVASIVE CV LAB;  Service: Cardiovascular;  Laterality: N/A;   AMPUTATION Right 09/24/2021   Procedure: RIGHT ABOVE KNEE AMPUTATION;  Surgeon: Magda Debby SAILOR, MD;  Location: [facility] OR;  Service: Vascular;  Laterality: Right;   APPENDECTOMY  2013   CHOLECYSTECTOMY     DIAGNOSTIC LAPAROSCOPY     ESOPHAGEAL DILATION N/A 10/09/2014   Procedure: ESOPHAGEAL DILATION;  Surgeon: Margo LITTIE Haddock, MD;  Location: [facility] ORS;  Service: Endoscopy;  Laterality: N/A;  #15 and #16 savory   ESOPHAGOGASTRODUODENOSCOPY (EGD) WITH PROPOFOL  N/A 10/09/2014   Dr. Haddock: mild erosive gastritis, Savary dilation. Nausea and vomiting most likely due to uncontroleld blood sugars, reflux, and gastritis.    PVC ABLATION N/A 02/04/2024   Procedure: PVC ABLATION;  Surgeon: Furbish Eulas BRAVO, MD;  Location: [facility] INVASIVE CV LAB;  Service:  Cardiovascular;  Laterality: N/A;     Home Medications:  Prior to Admission medications  Medication Sig Start Date End Date Taking? Authorizing Provider  albuterol  (PROVENTIL ) (2.5 MG/3ML) 0.083% nebulizer solution Take 2.5 mg by nebulization every 4 (four) hours as needed for wheezing or shortness of breath. 08/19/23   [provider]  ALPRAZolam  (XANAX ) 0.5 MG tablet Take 1 tablet (0.5 mg total) by mouth 3 (three) times daily as needed for anxiety. 10/02/21    Jillian Buttery, MD  aspirin  EC 81 MG tablet Take 81 mg by mouth daily. Swallow whole.    [provider]  clopidogrel  (PLAVIX ) 75 MG tablet Take 75 mg by mouth daily.    [provider]  docusate sodium  (COLACE) 100 MG capsule Take 100 mg by mouth daily as needed for mild constipation.    [provider]  DULoxetine  (CYMBALTA ) 30 MG capsule Take 30 mg by mouth daily. 06/10/23   [provider]  furosemide  (LASIX ) 40 MG tablet Take 40 mg by mouth 2 (two) times daily. 12/01/23   [provider]  gabapentin  (NEURONTIN ) 300 MG capsule Take 300 mg by mouth 3 (three) times daily as needed (pain).    [provider]  JARDIANCE  25 MG TABS tablet Take 25 mg by mouth daily. 10/29/23   [provider]  LANTUS  SOLOSTAR 100 UNIT/ML Solostar Pen Inject 40 Units into the skin 2 (two) times daily.    [provider]  linaclotide  (LINZESS ) 72 MCG capsule Take 72 mcg by mouth daily as needed (constipation).    [provider]  mexiletine (MEXITIL ) 200 MG capsule Take 1 capsule (200 mg total) by mouth 2 (two) times daily. 04/10/24   Debera Jayson MATSU, MD  ondansetron  (ZOFRAN ) 4 MG tablet Take 4 mg by mouth every 6 (six) hours as needed for nausea or vomiting.    [provider]  pantoprazole  (PROTONIX ) 40 MG tablet Take 1 tablet (40 mg total) by mouth daily. 10/02/21   Jillian Buttery, MD  propranolol  ER (INDERAL  LA) 120 MG 24 hr capsule TAKE ONE CAPSULE BY MOUTH DAILY 04/18/24   Mealor, Augustus E, MD  rosuvastatin  (CRESTOR ) 40 MG tablet Take 40 mg by mouth every evening. 05/18/23   [provider]  sacubitril -valsartan  (ENTRESTO ) 24-26 MG TAKE 1 TABLET BY MOUTH TWICE DAILY 07/03/24   Debera Jayson MATSU, MD  topiramate  (TOPAMAX ) 50 MG tablet Take 50 mg by mouth 2 (two) times daily. 10/29/23   [provider]  triamcinolone cream (KENALOG) 0.5 % Apply 1 Application topically 2 (two) times daily. 01/10/24   [provider]  VENTOLIN  HFA 108 (90 Base) MCG/ACT inhaler Inhale 2 puffs into the lungs every 6 (six) hours as needed for shortness of breath or wheezing. 06/10/23   [provider]  vitamin B-12 1000 MCG tablet Take 1 tablet (1,000 mcg total) by mouth daily. 10/03/21   Jillian Buttery, MD    Scheduled Meds:   stroke: early stages of recovery book   Does not apply Once   aspirin   81 mg Per Tube Daily   clopidogrel   75 mg Per Tube Daily   heparin   5,000 Units Subcutaneous Q8H   insulin  aspart  0-15 Units Subcutaneous TID WC   insulin  aspart  0-5 Units Subcutaneous QHS   rosuvastatin   40 mg Per Tube QPM   Continuous Infusions:  sodium bicarbonate  150 mEq in sterile water  1,150 mL infusion 85 mL/hr at 08/20/24 0351   PRN Meds: acetaminophen  **OR**  acetaminophen , albuterol , hydrALAZINE , ondansetron  **OR** ondansetron  (ZOFRAN ) IV  Allergies:   Allergies[1]  Social History:   Social History   Socioeconomic History   Marital status: Single    Spouse name: Not on file   Number of children: Not on file   Years of education: 12   Highest education level: 12th grade  Occupational History   Not on file  Tobacco Use   Smoking status: Every Day    Current packs/day: 1.00    Average packs/day: 1 pack/day for 20.0 years (20.0 ttl pk-yrs)    Types: Cigarettes    Passive exposure: Current   Smokeless tobacco: Never  Vaping Use   Vaping status: Former  Substance and Sexual Activity   Alcohol  use: No   Drug use: No   Sexual activity: Yes    Birth control/protection: Injection  Other Topics Concern   Not on file  Social History Narrative   Not on file   Social Drivers of Health   Tobacco Use: High Risk (08/18/2024)   Received from [facility]   Patient History    Smoking Tobacco Use: Every Day    Smokeless Tobacco Use: Never    Passive Exposure: Not on file  Financial Resource Strain: Low Risk (08/16/2024)   Received from [facility]   Overall Financial  Resource Strain (CARDIA)    How hard is it for you to pay for the very basics like food, housing, medical care, and heating?: Not very hard  Food Insecurity: No Food Insecurity (08/16/2024)   Received from [facility]   Epic    Within the past 12 months, you worried that your food would run out before you got the money to buy more.: Never true    Within the past 12 months, the food you bought just didn't last and you didn't have money to get more.: Never true  Transportation Needs: No Transportation Needs (08/16/2024)   Received from [facility]   PRAPARE - Transportation    Lack of Transportation (Medical): No    Lack of Transportation (Non-Medical): No  Physical Activity: Inactive (08/16/2024)   Received from [facility]   Exercise Vital Sign    On average, how many days per week do you engage in moderate to strenuous exercise (like a brisk walk)?: 0 days    On average, how many minutes do you engage in exercise at this level?: 0 min  Stress: No Stress Concern Present (08/16/2024)   Received from [facility] - Occupational Stress Questionnaire    Do you feel stress - tense, restless, nervous, or anxious, or unable to sleep at night because your mind is troubled all the time - these days?: Not at all  Social Connections: Socially Isolated (08/16/2024)   Received from [facility]   Social Connection and Isolation Panel    In a typical week, how many times do you talk on the phone with family, friends, or neighbors?: Three times a week    How often do you get together with friends or relatives?: Three times a week    How often do you attend church or religious services?: Never    Do you belong to any clubs or organizations such as church groups, unions, fraternal or athletic groups, or school groups?: No    How often do you attend meetings of the clubs or organizations you belong to?: Never    Are you married, widowed,  divorced,  separated, never married, or living with a partner?: Separated  Intimate Partner Violence: Not At Risk (08/16/2024)   Received from [facility]   Epic    Within the last year, have you been afraid of your partner or ex-partner?: No    Within the last year, have you been humiliated or emotionally abused in other ways by your partner or ex-partner?: No    Within the last year, have you been kicked, hit, slapped, or otherwise physically hurt by your partner or ex-partner?: No    Within the last year, have you been raped or forced to have any kind of sexual activity by your partner or ex-partner?: No  Depression (PHQ2-9): Medium Risk (03/18/2022)   Depression (PHQ2-9)    PHQ-2 Score: 8  Alcohol  Screen: Low Risk (03/18/2022)   Alcohol  Screen    Last Alcohol  Screening Score (AUDIT): 2  Housing: Low Risk (08/23/2023)   Housing Stability Vital Sign    Unable to Pay for Housing in the Last Year: No    Number of Times Moved in the Last Year: 0    Homeless in the Last Year: No  Utilities: Low Risk (08/16/2024)   Received from [facility]   Utilities    Within the past 12 months, have you been unable to get utilities(heat, electricity) when it was really needed?: No  Health Literacy: Medium Risk (08/16/2024)   Received from [facility] Literacy    How often do you need to have someone help you when you read instructions, pamphlets, or other written material from your doctor or pharmacy?: Rarely    Family History:    Family History  Problem Relation Age of Onset   Breast cancer Mother    Kidney disease Sister    Diabetes Sister    Breast cancer Sister    Diabetes Brother    Colon cancer Neg Hx    Liver disease Neg Hx      ROS:  Please see the history of present illness.   All other ROS reviewed and negative.     Physical Exam/Data: Vitals:   08/20/24 0046 08/20/24 0049 08/20/24 0349 08/20/24 0452  BP: (!) 152/64 (!) 152/64    Pulse: (!) 55 (!) 55 (!) 55   Resp:   16 18   Temp:  97.9 F (36.6 C) 97.7 F (36.5 C)   TempSrc:  Oral Oral   SpO2: 96% 95% 94%   Weight:    60 kg    Intake/Output Summary (Last 24 hours) at 08/20/2024 0724 Last data filed at 08/20/2024 0456 Gross per 24 hour  Intake 259.18 ml  Output 500 ml  Net -240.82 ml      08/20/2024    4:52 AM 08/19/2024    9:34 PM 08/19/2024    4:37 PM  Last 3 Weights  Weight (lbs) 132 lb 4.4 oz 131 lb 2.8 oz 132 lb 11.5 oz  Weight (kg) 60 kg 59.5 kg 60.2 kg     Body mass index is 33.11 kg/m.  General: Awake, unable to respond to questions, NG tube in place HEENT: normal Neck: no JVD Vascular: No carotid bruits; Distal pulses 2+ bilaterally Cardiac:  normal S1, S2; RRR; systolic murmur Lungs: Slight crackles at the right base, no increased work of breathing Abd: soft, nontender, no hepatomegaly  Ext: no edema in bilateral thighs Musculoskeletal: Bilateral AKA Skin: warm and dry  Neuro: Unable to participate in neurologic exam Psych: Unable to  assess  EKG:  The EKG was personally reviewed and demonstrates: Normal sinus rhythm with right axis deviation Telemetry:  Telemetry was personally reviewed and demonstrates: Normal sinus rhythm with intermittent PVCs and short runs of nonsustained ventricular tachycardia  Relevant CV Studies: Echo 08/07/2024  IMPRESSIONS    1. Left ventricular ejection fraction, by estimation, is 35 to 40%. The  left ventricle has mildly decreased function. The left ventricle  demonstrates global hypokinesis. Left ventricular diastolic parameters are  indeterminate.   2. Right ventricular systolic function is low normal. The right  ventricular size is mildly enlarged. Tricuspid regurgitation signal is  inadequate for assessing PA pressure.   3. Left atrial size was severely dilated.   4. A small pericardial effusion is present. The pericardial effusion is  circumferential.   5. Eccentric posterior mitral regurgitation that may be underestimated  due to  eccentricity of jet. At least moderate mitral regurgitation. MV  VTI/AV VTI ratio is 1.1 suggesting moderate MR. The mitral valve is  abnormal. Moderate mitral valve  regurgitation. Mild mitral stenosis. The mean mitral valve gradient is 5.0  mmHg.   6. AVA VTI 1.3, mean gradient 5 mmHg, DI 0.46, SVI 26. Data would support  low flow low gradient moderate aortic stenosis. . The aortic valve is  tricuspid. There is moderate calcification of the aortic valve. There is  moderate thickening of the aortic  valve. Aortic valve regurgitation is moderate. Moderate aortic valve  stenosis.   7. The inferior vena cava is dilated in size with <50% respiratory  variability, suggesting right atrial pressure of 15 mmHg.   Comparison(s): A prior study was performed on 08/23/2023. EF 30-35%. Left  atrium was severely dilated. Right atrium was moderately dilated. he  posterior leafletof the mitrave valve appeared restricted. Modearte to  severe mitral regurgitation with a  eccentric jet. Trivial aortic regurgitation. Mild aortic valve stenosis.   NM Study 08/2022 -     Baseline EKG showed normal sinus rhythm and frequent PVCs. Stress ECG is negative for ischemia but has frequent PVCs in a pattern of ventricular quadrigeminy and trigeminy.   LV perfusion is normal. There is no evidence of ischemia. There is no evidence of infarction.   Left ventricular function is normal. Nuclear stress EF: 55 %.   The study is normal. The study is low risk.  Carotid duplex 08/2023 -   Right Carotid: Velocities in the right ICA are consistent with a 1-39% stenosis.                Non-hemodynamically significant plaque <50% noted in the CCA. The ECA appears >50% stenosed.   Left Carotid: Velocities in the left ICA are consistent with a 40-59% stenosis.               Non-hemodynamically significant plaque <50% noted in the CCA. The ECA appears <50% stenosed.   Vertebrals:  Right vertebral artery demonstrates antegrade  flow. Left  vertebral artery was not visualized.  Subclavians: Normal flow hemodynamics were seen in bilateral subclavian               arteries.   Laboratory Data: High Sensitivity Troponin:  No results for input(s): TROPONINIHS in the last 720 hours. No results for input(s): TRNPT in the last 720 hours.    Chemistry Recent Labs  Lab 08/19/24 1839 08/19/24 1848 08/20/24 0312  NA 139  --  142  K 4.5  --  4.2  CL 108  --  110  CO2  13*  --  15*  GLUCOSE 259*  --  192*  BUN 98*  --  96*  CREATININE 3.96*  --  3.72*  CALCIUM  8.0*  --  7.8*  MG  --  2.3  --   GFRNONAA 13*  --  14*  ANIONGAP 19*  --  17*    Recent Labs  Lab 08/19/24 1839  PROT 5.5*  ALBUMIN 3.1*  AST 31  ALT 38  ALKPHOS 174*  BILITOT 0.5   Lipids No results for input(s): CHOL, TRIG, HDL, LABVLDL, LDLCALC, CHOLHDL in the last 168 hours.  Hematology Recent Labs  Lab 08/19/24 1839 08/20/24 0312  WBC 11.5* 10.7*  RBC 3.63* 3.71*  HGB 11.1* 11.2*  HCT 34.6* 34.7*  MCV 95.3 93.5  MCH 30.6 30.2  MCHC 32.1 32.3  RDW 16.3* 16.0*  PLT 154 138*   Thyroid  No results for input(s): TSH, FREET4 in the last 168 hours.  BNP Recent Labs  Lab 08/19/24 1839  PROBNP >35,000.0*    DDimer No results for input(s): DDIMER in the last 168 hours.  Radiology/Studies:  DG Abd 1 View Result Date: 08/20/2024 EXAM: 1 VIEW XRAY OF THE ABDOMEN 08/20/2024 12:44:43 AM COMPARISON: None available. CLINICAL HISTORY: Nasogastric tube present. FINDINGS: LINES, TUBES AND DEVICES: Enteric tube in place with distal tip within the stomach, the proximal side port lies at the gastroesophageal junction. This should be advanced deeper into the stomach. BOWEL: Nonobstructive bowel gas pattern. Scattered large and small bowel gas is noted. No free air is seen. SOFT TISSUES: Cholecystectomy clips noted. BONES: No acute fracture. IMPRESSION: 1. Enteric tube in place with distal tip within the stomach and proximal side port  at the gastroesophageal junction; this should be advanced deeper into the stomach. 2. No free air. Electronically signed by: Oneil Devonshire MD 08/20/2024 12:47 AM EST RP Workstation: [id number]   MR BRAIN WO CONTRAST Result Date: 08/19/2024 EXAM: MRI Brain Without Contrast 08/19/2024 10:13:21 PM TECHNIQUE: Multiplanar multisequence MRI of the head/brain was performed without the administration of intravenous contrast. COMPARISON: None available. CLINICAL HISTORY: Neuro deficit, acute, stroke suspected Acute neurological deficit, stroke suspected. FINDINGS: Motion limited and incomplete study.  No SWI and no T2.  Within this limitation: BRAIN AND VENTRICLES: Significantly motion limited study with many acute infarcts in bilateral frontal, parietal and occipital lobes, right basal ganglia, left thalamus and left cerebellum. No obvious intracranial hemorrhage. No mass. No midline shift. No hydrocephalus. The sella is unremarkable. Normal flow voids. ORBITS: No significant abnormality. SINUSES AND MASTOIDS: No significant abnormality. BONES AND SOFT TISSUES: Normal marrow signal. No soft tissue abnormality. IMPRESSION: 1. Significantly motion limited and incomplete study with many acute infarcts in bilateral frontal, parietal and occipital lobes, right basal ganglia, left thalamus and left cerebellum. Consider embolic etiology. Electronically signed by: Glendia Molt MD 08/19/2024 10:29 PM EST RP Workstation: [id number]   DG CHEST PORT 1 VIEW Result Date: 08/19/2024 EXAM: 1 VIEW(S) XRAY OF THE CHEST 08/19/2024 06:54:00 PM COMPARISON: 08/22/2023 CLINICAL HISTORY: Cough. FINDINGS: LINES, TUBES AND DEVICES: Enteric tube in place with tip and side port below the hemidiaphragm. LUNGS AND PLEURA: Basilar airspace opacities are noted, right greater than left. No pleural effusion. No pneumothorax. HEART AND MEDIASTINUM: Cardiomegaly. Aortic calcification. BONES AND SOFT TISSUES: No acute osseous abnormality. IMPRESSION: 1.  Basilar airspace opacities, right greater than left. 2. Enteric tube tip and side port project below the diaphragm. Electronically signed by: Oneil Devonshire MD 08/19/2024 07:03 PM EST RP Workstation: [id number]  Assessment and Plan: Acute on chronic heart failure with reduced ejection fraction (EF 35% - systolic and diastolic) - Presumed nonischemic as previous stress test in 2024 with no ischemic changes but she does have significant coronary calcifications on imaging.  No coronary angiogram on file.  Her exam is notable for the absence of volume overload.  Her JVD is not elevated and she has no lower extremity edema.  At the time of my exam she was on room air and her oxygen saturation was 96%.  Due to her AKI and baseline bradycardia, her options for GDMT are limited to ACE inhibitor or ARB if blood pressure allows.  Would avoid additional diuresis given the absence of volume overload on exam. Consider small fluid boluses as needed. Ordered echocardiogram to ensure no changes since recent complete Echo. AKI - She does not appear volume overloaded currently.  An NT proBNP is pending.  It may be helpful to evaluate for signs of ATN by urine microscopy. Elevated troponin-elevated to the 200s at [facility].  She is unable to verbalize whether she has chest pain or not when asked.  ECG pending. Suspect due to demand and AKI. CAD - continue statin, anti-platelets, given current medical comorbidities, will forego coronary angiogram Moderate eccentric mitral regurgitation -could be underestimated due to the eccentricity of the mitral regurgitation jet.  Given her medical comorbidities she is unlikely a candidate for mitral valve intervention even if the valve disease is severe.  Therefore we will continue with medical management which will include appropriate dosing of diuretics and forego TEE. Moderate aortic stenosis-last echocardiogram within the past month revealed moderate low-flow low gradient aortic  stenosis.  Her exam seems consistent with this.  She will need routine echocardiograms in the future to evaluate for progression. CVA - per Neurology, brain MRI pending. Monitor for AF on telemetry and will need long term monitoring on discharge as her stroke is concerning for an embolic etiology. Carotid Artery Stenosis -continue statin and antiplatelets per neurology for CVA, stenosis < 60% bilaterally PAD s/p bilateral AKA - continue statin and antiplatelets Goals of Care - follow up result of palliative consult.   Risk Assessment/Risk Scores:       [place]  Heart Association (NYHA) Functional Class NYHA Class III       For questions or updates, please contact [facility] Please consult [email or web address] for contact info under      Signed, Jalaine DELENA Newcomer, MD  08/20/2024 7:24 AM     [1] No Known Allergies  "

## 2024-08-19 NOTE — Consult Note (Incomplete)
 NEUROLOGY CONSULT NOTE   Date of service: August 19, 2024 Patient Name: [name] MRN:  [id number] DOB:  [birth year] Chief Complaint: Confusion Requesting Provider: Vernon Velna SAUNDERS, MD  History of Present Illness  [name] is a 54 y.o. female with hx of hypertension, diabetes, heart failure with EF 35 to 40%, PAD status post AKA, history of PE, anemia who was transferred from [facility] due to renal failure, confusion, and concern for stroke.  She became less responsive on 1/21 and was not moving the left arm and therefore a code stroke was activated and a CT head showed right caudate infarct.  She was briefly made comfort care, but had an improving mental status and therefore has been transferred for further care.  LKW: Unclear, but definitely by 1/21 IV Thrombolysis: No, out of window EVT: No, out of window  NIHSS components Score: Comment  1a Level of Conscious 0[x]  1[]  2[]  3[]      1b LOC Questions 0[]  1[]  2[x]       1c LOC Commands 0[x]  1[]  2[]       2 Best Gaze 0[x]  1[]  2[]       3 Visual 0[x]  1[]  2[]  3[]      4 Facial Palsy 0[x]  1[]  2[]  3[]      5a Motor Arm - left 0[]  1[]  2[x]  3[]  4[]  UN[]    5b Motor Arm - Right 0[]  1[x]  2[]  3[]  4[]  UN[]    6a Motor Leg - Left 0[x]  1[]  2[]  3[]  4[]  UN[]    6b Motor Leg - Right 0[x]  1[]  2[]  3[]  4[]  UN[]    7 Limb Ataxia 0[x]  1[]  2[]  UN[]      8 Sensory 0[x]  1[]  2[]  UN[]      9 Best Language 0[]  1[]  2[x]  3[]      10 Dysarthria 0[x]  1[]  2[]  UN[]      11 Extinct. and Inattention 0[x]  1[]  2[]       TOTAL: 6      Past History   Past Medical History:  Diagnosis Date   Anemia 02/05/2012   Anxiety    Arthritis    Asthma    B12 deficiency    CKD (chronic kidney disease) stage 3, GFR 30-59 ml/min (HCC) 09/25/2021   COPD (chronic obstructive pulmonary disease) (HCC)    Depression    Diabetes (HCC)    Elevated LFTs    Essential hypertension    Fatty liver    Folate deficiency    GERD (gastroesophageal reflux disease)     Headache(784.0)    Heart failure (HCC)    PAD (peripheral artery disease)    Status post bilateral AKA   PE (pulmonary embolism)    Xarelto   Pneumonia 2013   PVC (premature ventricular contraction)    Sepsis (HCC)    Type 2 diabetes mellitus (HCC)    Vitamin D deficiency     Past Surgical History:  Procedure Laterality Date   ABDOMINAL AORTOGRAM W/LOWER EXTREMITY N/A 09/05/2021   Procedure: ABDOMINAL AORTOGRAM W/LOWER EXTREMITY;  Surgeon: Magda Debby SAILOR, MD;  Location: [facility];  Service: Cardiovascular;  Laterality: N/A;   AMPUTATION Right 09/24/2021   Procedure: RIGHT ABOVE KNEE AMPUTATION;  Surgeon: Magda Debby SAILOR, MD;  Location: [facility] OR;  Service: Vascular;  Laterality: Right;   APPENDECTOMY  2013   CHOLECYSTECTOMY     DIAGNOSTIC LAPAROSCOPY     ESOPHAGEAL DILATION N/A 10/09/2014   Procedure: ESOPHAGEAL DILATION;  Surgeon: Margo LITTIE Haddock, MD;  Location: [facility];  Service: Endoscopy;  Laterality: N/A;  #15  and #16 savory   ESOPHAGOGASTRODUODENOSCOPY (EGD) WITH PROPOFOL  N/A 10/09/2014   Dr. Harvey: mild erosive gastritis, Savary dilation. Nausea and vomiting most likely due to uncontroleld blood sugars, reflux, and gastritis.    PVC ABLATION N/A 02/04/2024   Procedure: PVC ABLATION;  Surgeon: Nancey Eulas BRAVO, MD;  Location: [facility];  Service: Cardiovascular;  Laterality: N/A;    Family History: Family History  Problem Relation Age of Onset   Breast cancer Mother    Kidney disease Sister    Diabetes Sister    Breast cancer Sister    Diabetes Brother    Colon cancer Neg Hx    Liver disease Neg Hx     Social History  reports that she has been smoking cigarettes. She has a 20 pack-year smoking history. She has been exposed to tobacco smoke. She has never used smokeless tobacco. She reports that she does not drink alcohol  and does not use drugs.  Allergies[1]  Medications  Current Medications[2]  Vitals   Vitals:    09-01-24 1637 September 01, 2024 2024  BP: 133/79 128/83  Pulse: (!) 59   Resp: (P) 18 18  Temp: 99.1 F (37.3 C) 98.8 F (37.1 C)  TempSrc: Axillary Oral  SpO2: 96%   Weight: (P) 60.2 kg     Body mass index is 33.22 kg/m (pended).   Physical Exam   Constitutional: Appears well-developed and well-nourished.   Neurologic Examination    Neuro: Mental Status: Patient is awake, alert, she is unable to name, but she is able to answer yes/no questions reliably she is able to tell me her name, but not the month or year. Cranial Nerves: II: Visual Fields are full. Pupils are equal, round, and reactive to light.   III,IV, VI: EOMI without ptosis or diploplia.  V: Facial sensation is symmetric to temperature VII: Facial movement is symmetric.  Motor: She does not cooperate well with formal testing, but she does drift significantly in the right upper extremity and just barely To the left for 10 seconds, the left arm drifts more quickly to the bed.  She is able to lift both stumps with good strength Sensory: Sensation is symmetric to light touch and temperature in the arms and legs. Cerebellar: She does not cooperate       Labs/Imaging/Neurodiagnostic studies   CBC:  Recent Labs  Lab 2024-09-01 1839  WBC 11.5*  NEUTROABS 8.8*  HGB 11.1*  HCT 34.6*  MCV 95.3  PLT 154   Basic Metabolic Panel:  Lab Results  Component Value Date   NA 139 01-Sep-2024   K 4.5 2024/09/01   CO2 13 (L) 2024/09/01   GLUCOSE 259 (H) 09-01-24   BUN 98 (H) 2024-09-01   CREATININE 3.96 (H) 2024/09/01   CALCIUM  8.0 (L) 09-01-24   GFRNONAA 13 (L) 09-01-24   GFRAA 77 (L) 09/06/2014     MRI Brain(Personally reviewed): Multifocal small infarcts  Echo from 1/20-EF 35 to 40%, dilated left atrium mildly dilated right ventricle moderately dilated left ventricle, but no thrombus was seen  Hemoglobin A1c was 10.1 LDL was 119  ASSESSMENT   [name] is a 54 y.o. female with multiple small  infarcts in the setting of heart failure, hypertension, hyperlipidemia concerning for embolic etiology.  I do wonder if she has cerebral emboli if embolic disease could be contributing to her renal dysfunction as well.  Her recent echo did not show any signs of embolic source, but she will need prolonged cardiac monitoring.  RECOMMENDATIONS  Continue statin, goal LDL less than 70  ______________________________________________________________________    Signed, Aisha Seals, MD [facility]      [1] No Known Allergies [2]  Current Facility-Administered Medications:    [START ON 08/20/2024]  stroke: early stages of recovery book, , Does not apply, Once, Pahwani, Rinka R, MD   acetaminophen  (TYLENOL ) tablet 650 mg, 650 mg, Oral, Q6H PRN **OR** acetaminophen  (TYLENOL ) suppository 650 mg, 650 mg, Rectal, Q6H PRN, Pahwani, Rinka R, MD   albuterol  (PROVENTIL ) (2.5 MG/3ML) 0.083% nebulizer solution 2.5 mg, 2.5 mg, Nebulization, Q4H PRN, Pahwani, Rinka R, MD   aspirin  chewable tablet 81 mg, 81 mg, Per Tube, Daily, Cyndy Ozell DASEN, RPH, 81 mg at 08/19/24 2311   [START ON 08/20/2024] clopidogrel  (PLAVIX ) tablet 75 mg, 75 mg, Per Tube, Daily, Bitonti, Michael T, RPH   heparin  injection 5,000 Units, 5,000 Units, Subcutaneous, Q8H, Pahwani, Rinka R, MD, 5,000 Units at 08/19/24 2332   hydrALAZINE  (APRESOLINE ) injection 10 mg, 10 mg, Intravenous, Q6H PRN, Pahwani, Rinka R, MD   [START ON 08/20/2024] insulin  aspart (novoLOG ) injection 0-15 Units, 0-15 Units, Subcutaneous, TID WC, Pahwani, Rinka R, MD   insulin  aspart (novoLOG ) injection 0-5 Units, 0-5 Units, Subcutaneous, QHS, Pahwani, Rinka R, MD, 2 Units at 08/19/24 2330   ondansetron  (ZOFRAN ) tablet 4 mg, 4 mg, Oral, Q6H PRN **OR** ondansetron  (ZOFRAN ) injection 4 mg, 4 mg, Intravenous, Q6H PRN, Pahwani, Rinka R, MD   [START ON 08/20/2024] rosuvastatin  (CRESTOR ) tablet 40 mg, 40 mg, Per Tube, QPM, Bitonti, Michael T,  RPH   sodium bicarbonate  150 mEq in sterile water  1,150 mL infusion, , Intravenous, Continuous, Geralynn Charleston, MD

## 2024-08-19 NOTE — Consult Note (Signed)
 NEUROLOGY CONSULT NOTE   Date of service: [name] 24, 2026 Patient Name: [name] MRN:  [id number] DOB:  [birth year] Chief Complaint: Confusion Requesting Provider: Vernon Velna SAUNDERS, MD  History of Present Illness  [name] is a 54 y.o. female with hx of hypertension, diabetes, heart failure with EF 35 to 40%, PAD status post AKA, history of PE, anemia who was transferred from [facility] due to renal failure, confusion, and concern for stroke.  She became less responsive on 1/21 and was not moving the left arm and therefore a code stroke was activated and a CT head showed right caudate infarct.  She was briefly made comfort care, but had an improving mental status and therefore has been transferred for further care.  LKW: Unclear, but definitely by 1/21 IV Thrombolysis: No, out of window EVT: No, out of window  NIHSS components Score: Comment  1a Level of Conscious 0[x]  1[]  2[]  3[]      1b LOC Questions 0[]  1[]  2[x]       1c LOC Commands 0[x]  1[]  2[]       2 Best Gaze 0[x]  1[]  2[]       3 Visual 0[x]  1[]  2[]  3[]      4 Facial Palsy 0[x]  1[]  2[]  3[]      5a Motor Arm - left 0[]  1[]  2[x]  3[]  4[]  UN[]    5b Motor Arm - Right 0[]  1[x]  2[]  3[]  4[]  UN[]    6a Motor Leg - Left 0[x]  1[]  2[]  3[]  4[]  UN[]    6b Motor Leg - Right 0[x]  1[]  2[]  3[]  4[]  UN[]    7 Limb Ataxia 0[x]  1[]  2[]  UN[]      8 Sensory 0[x]  1[]  2[]  UN[]      9 Best Language 0[]  1[]  2[x]  3[]      10 Dysarthria 0[x]  1[]  2[]  UN[]      11 Extinct. and Inattention 0[x]  1[]  2[]       TOTAL: 6      Past History   Past Medical History:  Diagnosis Date   Anemia 02/05/2012   Anxiety    Arthritis    Asthma    B12 deficiency    CKD (chronic kidney disease) stage 3, GFR 30-59 ml/min (HCC) 09/25/2021   COPD (chronic obstructive pulmonary disease) (HCC)    Depression    Diabetes (HCC)    Elevated LFTs    Essential hypertension    Fatty liver    Folate deficiency    GERD (gastroesophageal reflux disease)     Headache(784.0)    Heart failure (HCC)    PAD (peripheral artery disease)    Status post bilateral AKA   PE (pulmonary embolism)    Xarelto   Pneumonia 2013   PVC (premature ventricular contraction)    Sepsis (HCC)    Type 2 diabetes mellitus (HCC)    Vitamin D deficiency     Past Surgical History:  Procedure Laterality Date   ABDOMINAL AORTOGRAM W/LOWER EXTREMITY N/A 09/05/2021   Procedure: ABDOMINAL AORTOGRAM W/LOWER EXTREMITY;  Surgeon: Magda Debby SAILOR, MD;  Location: [facility];  Service: Cardiovascular;  Laterality: N/A;   AMPUTATION Right 09/24/2021   Procedure: RIGHT ABOVE KNEE AMPUTATION;  Surgeon: Magda Debby SAILOR, MD;  Location: [facility] OR;  Service: Vascular;  Laterality: Right;   APPENDECTOMY  2013   CHOLECYSTECTOMY     DIAGNOSTIC LAPAROSCOPY     ESOPHAGEAL DILATION N/A 10/09/2014   Procedure: ESOPHAGEAL DILATION;  Surgeon: Margo LITTIE Haddock, MD;  Location: [facility];  Service: Endoscopy;  Laterality: N/A;  #15  and #16 savory   ESOPHAGOGASTRODUODENOSCOPY (EGD) WITH PROPOFOL  N/A 10/09/2014   Dr. Harvey: mild erosive gastritis, Savary dilation. Nausea and vomiting most likely due to uncontroleld blood sugars, reflux, and gastritis.    PVC ABLATION N/A 02/04/2024   Procedure: PVC ABLATION;  Surgeon: Nancey Eulas BRAVO, MD;  Location: [facility];  Service: Cardiovascular;  Laterality: N/A;    Family History: Family History  Problem Relation Age of Onset   Breast cancer Mother    Kidney disease Sister    Diabetes Sister    Breast cancer Sister    Diabetes Brother    Colon cancer Neg Hx    Liver disease Neg Hx     Social History  reports that she has been smoking cigarettes. She has a 20 pack-year smoking history. She has been exposed to tobacco smoke. She has never used smokeless tobacco. She reports that she does not drink alcohol  and does not use drugs.  Allergies[1]  Medications  Current Medications[2]  Vitals   Vitals:   [name] 31, 2026 1637 Aug 26, 2024 2024   BP: 133/79 128/83  Pulse: (!) 59   Resp: (P) 18 18  Temp: 99.1 F (37.3 C) 98.8 F (37.1 C)  TempSrc: Axillary Oral  SpO2: 96%   Weight: (P) 60.2 kg     Body mass index is 33.22 kg/m (pended).   Physical Exam   Constitutional: Appears well-developed and well-nourished.   Neurologic Examination    Neuro: Mental Status: Patient is awake, alert, she is unable to name, but she is able to answer yes/no questions reliably she is able to tell me her name, but not the month or year. Cranial Nerves: II: Visual Fields are full. Pupils are equal, round, and reactive to light.   III,IV, VI: EOMI without ptosis or diploplia.  V: Facial sensation is symmetric to temperature VII: Facial movement is symmetric.  Motor: She does not cooperate well with formal testing, but she does drift significantly in the right upper extremity and just barely To the left for 10 seconds, the left arm drifts more quickly to the bed.  She is able to lift both stumps with good strength Sensory: Sensation is symmetric to light touch and temperature in the arms and legs. Cerebellar: She does not cooperate       Labs/Imaging/Neurodiagnostic studies   CBC:  Recent Labs  Lab 08/26/24 1839  WBC 11.5*  NEUTROABS 8.8*  HGB 11.1*  HCT 34.6*  MCV 95.3  PLT 154   Basic Metabolic Panel:  Lab Results  Component Value Date   NA 139 2024-08-26   K 4.5 26-Aug-2024   CO2 13 (L) 2024-08-26   GLUCOSE 259 (H) 08/26/24   BUN 98 (H) 26-Aug-2024   CREATININE 3.96 (H) 08/26/24   CALCIUM  8.0 (L) Aug 26, 2024   GFRNONAA 13 (L) 08/26/2024   GFRAA 77 (L) 09/06/2014     MRI Brain(Personally reviewed): Multifocal small infarcts  Echo from 1/20-EF 35 to 40%, dilated left atrium mildly dilated right ventricle moderately dilated left ventricle, but no thrombus was seen  Hemoglobin A1c was 10.1 LDL was 119  ASSESSMENT   [name] is a 54 y.o. female with multiple small infarcts in the setting of  heart failure, hypertension, hyperlipidemia concerning for embolic etiology.  I do wonder if she has cerebral emboli if embolic disease could be contributing to her renal dysfunction as well.  Her recent echo did not show any signs of embolic source, but she will need prolonged cardiac monitoring.  RECOMMENDATIONS  Continue statin, goal LDL less than 70 Continue aspirin  and Plavix  Permissive hypertension Prolonged cardiac monitoring May need to consider repeating echo Stroke team to follow ______________________________________________________________________    Signed, Aisha Seals, MD [facility]    [1] No Known Allergies [2]  Current Facility-Administered Medications:    [START ON 08/20/2024]  stroke: early stages of recovery book, , Does not apply, Once, Pahwani, Rinka R, MD   acetaminophen  (TYLENOL ) tablet 650 mg, 650 mg, Oral, Q6H PRN **OR** acetaminophen  (TYLENOL ) suppository 650 mg, 650 mg, Rectal, Q6H PRN, Pahwani, Rinka R, MD   albuterol  (PROVENTIL ) (2.5 MG/3ML) 0.083% nebulizer solution 2.5 mg, 2.5 mg, Nebulization, Q4H PRN, Pahwani, Rinka R, MD   aspirin  chewable tablet 81 mg, 81 mg, Per Tube, Daily, Bitonti, Michael T, RPH, 81 mg at 08/19/24 2311   [START ON 08/20/2024] clopidogrel  (PLAVIX ) tablet 75 mg, 75 mg, Per Tube, Daily, Bitonti, Michael T, [facility]   heparin  injection 5,000 Units, 5,000 Units, Subcutaneous, Q8H, Pahwani, Rinka R, MD, 5,000 Units at 08/19/24 2332   hydrALAZINE  (APRESOLINE ) injection 10 mg, 10 mg, Intravenous, Q6H PRN, Pahwani, Rinka R, MD   [START ON 08/20/2024] insulin  aspart (novoLOG ) injection 0-15 Units, 0-15 Units, Subcutaneous, TID WC, Pahwani, Rinka R, MD   insulin  aspart (novoLOG ) injection 0-5 Units, 0-5 Units, Subcutaneous, QHS, Pahwani, Rinka R, MD, 2 Units at 08/19/24 2330   ondansetron  (ZOFRAN ) tablet 4 mg, 4 mg, Oral, Q6H PRN **OR** ondansetron  (ZOFRAN ) injection 4 mg, 4 mg, Intravenous, Q6H PRN, Pahwani, Rinka R, MD    [START ON 08/20/2024] rosuvastatin  (CRESTOR ) tablet 40 mg, 40 mg, Per Tube, QPM, Bitonti, Michael T, RPH   sodium bicarbonate  150 mEq in sterile water  1,150 mL infusion, , Intravenous, Continuous, Geralynn Charleston, MD

## 2024-08-19 NOTE — Progress Notes (Signed)
 Pt failed swallow screen. SLP order placed. MD notified

## 2024-08-19 NOTE — Progress Notes (Signed)
 Patient found by primary RN without NGT in place.  31F NGT replaced to right nare with Mindy, RRT at bedside and verified placement.

## 2024-08-19 NOTE — Care Plan (Signed)
 Pt was given acetaminophen  and ibuprofen through the night to decrease elevated temp. Pt used nonpharm therapy to decrease temp such as a tepid bed bath, decreasing room temp, pulling off blanket, and running a fan. Pt expressed physical needs clearly. Pt said she was in pain twice, RN admin oxy both times. Dr was notified of temperatures and low urine output. Pt tolerated NG feeds well, very little residual feeds were suctioned up.   Shift Summary Acetaminophen  was administered twice for fever, and ibuprofen was given for elevated temperature later in the shift.  Oxygen therapy was maintained via nasal cannula, with SpO2 and respiratory rate monitored closely.  Jevity 1.5 tube feed bolus was provided to address poor nutrition status.  Insulin  lispro was administered for elevated blood glucose, and heparin  was given.  Patient remained cooperative and dependent for hygiene and bathing, with overall status stable and monitored throughout the shift.   Optimal Cerebral Tissue Perfusion: NIH Stroke Scale remained stable at 11 throughout the shift, with persistent mild-to-moderate sensory loss and drowsiness; right hand grip stayed weak and limb ataxia was present in one limb, with no change in motor function or neurological symptoms over time.   Optimal Nutrition Intake: Nutrition status was very poor and patient remained NPO, but a Jevity 1.5 tube feed bolus was administered during the shift.   Effective Oxygenation and Ventilation: Respiratory rate fluctuated, peaking at 25 before returning to baseline, while SpO2 gradually decreased to 91% before improving to 94% by end of shift; oxygen therapy was provided via humidified nasal cannula with FiO2 at 28%.   Improved Sensorimotor Function: Passive range of motion was performed for bilateral upper and lower extremities, but right hand grip remained weak and motor function unchanged.   Effective Urinary Elimination: Urine output was low and small in  amount, with clear appearance and indwelling catheter in place; catheter site remained clean and intact, and necessity was reviewed with care team.

## 2024-08-19 NOTE — Consult Note (Addendum)
 Renal Service Consult Note [facility] Lamar JONETTA Fret, MD  Patient: [name] Date: 08/19/2024 Requesting Physician: Dr. FABIENE Skeeter  Reason for Consult: Renal failure HPI: The patient is a 54 y.o. year-old w/ PMH significant for anemia, asthma, CKD 3, COPD, depression, diabetes, HTN, fatty liver, HFrEF EF 35 to 40%, PAD status post bilateral AKA, H/O PE, who presented to [facility] on 08/15/2024 brought in by family saying that she had been sick for the past month or so with intermittent nausea vomiting and diarrhea and reduced p.o. intake.  Over the last couple of days her urine output had reduced as well as her p.o. intake and she developed abdominal pain.  In the ED BP was greater than 70,000, creatinine 2.0, CT chest showed moderate right and mild to moderate left pleural effusions with scattered ground glass representing possible edema.  Patient was admitted for IV diuresis and IV antibiotics for UTI.  Her Entresto  and Aldactone and Jardiance  were continued.  January 21, patient became less responsive and nonverbal throughout the day.  She was able to make eye contact but then became completely unresponsive not moving the left arm to painful stimulus.  A code stroke was called and a CT head showed infarct of the right caudate.  Patient was moved to ICU.  IV Lasix  was continued.  The next day patient had poor urine output and rising creatinine.  Oxygen was required.  Patient was felt to be declining and family conversations were made and they felt that she was approaching end-of-life.  She was transitioned to comfort care with DNR and DNI CODE STATUS.  IV Lasix  was stopped and O2 provided as needed.  On January 23, patient was a bit more alert and started to open her eyes and track, she can answer her name and a couple simple questions.  NG tube was placed.  Creatinine was up to 3.7, Lasix  continued to be on hold.  Entresto  and Aldactone were put on hold.  Urine output  was poor.  Mild hyperkalemia.  [name] was called to see if they will accept the patient given her progressive renal failure.  Patient was accepted.  We are asked to see for renal failure.   Pt seen in hospital room.  Her eyes are open she responds verbally.  He is a bit confused.  She got 2026 with some help.  Did not know where she was.  Knew that the president was Trump.  Denied any shortness of breath, chest pain, abdominal pain.  Poor historian in general   ROS - n/a   Past Medical History  Past Medical History:  Diagnosis Date   Anemia 02/05/2012   Anxiety    Arthritis    Asthma    B12 deficiency    CKD (chronic kidney disease) stage 3, GFR 30-59 ml/min (HCC) 09/25/2021   COPD (chronic obstructive pulmonary disease) (HCC)    Depression    Diabetes (HCC)    Elevated LFTs    Essential hypertension    Fatty liver    Folate deficiency    GERD (gastroesophageal reflux disease)    Headache(784.0)    Heart failure (HCC)    PAD (peripheral artery disease)    Status post bilateral AKA   PE (pulmonary embolism)    Xarelto   Pneumonia 2013   PVC (premature ventricular contraction)    Sepsis (HCC)    Type 2 diabetes mellitus (HCC)    Vitamin D deficiency  Past Surgical History  Past Surgical History:  Procedure Laterality Date   ABDOMINAL AORTOGRAM W/LOWER EXTREMITY N/A 09/05/2021   Procedure: ABDOMINAL AORTOGRAM W/LOWER EXTREMITY;  Surgeon: Magda Debby SAILOR, MD;  Location: [facility] INVASIVE CV LAB;  Service: Cardiovascular;  Laterality: N/A;   AMPUTATION Right 09/24/2021   Procedure: RIGHT ABOVE KNEE AMPUTATION;  Surgeon: Magda Debby SAILOR, MD;  Location: [facility] OR;  Service: Vascular;  Laterality: Right;   APPENDECTOMY  2013   CHOLECYSTECTOMY     DIAGNOSTIC LAPAROSCOPY     ESOPHAGEAL DILATION N/A 10/09/2014   Procedure: ESOPHAGEAL DILATION;  Surgeon: Margo LITTIE Haddock, MD;  Location: [facility] ORS;  Service: Endoscopy;  Laterality: N/A;  #15 and #16 savory   ESOPHAGOGASTRODUODENOSCOPY  (EGD) WITH PROPOFOL  N/A 10/09/2014   Dr. Haddock: mild erosive gastritis, Savary dilation. Nausea and vomiting most likely due to uncontroleld blood sugars, reflux, and gastritis.    PVC ABLATION N/A 02/04/2024   Procedure: PVC ABLATION;  Surgeon: Nancey Eulas BRAVO, MD;  Location: [facility] INVASIVE CV LAB;  Service: Cardiovascular;  Laterality: N/A;   Family History  Family History  Problem Relation Age of Onset   Breast cancer Mother    Kidney disease Sister    Diabetes Sister    Breast cancer Sister    Diabetes Brother    Colon cancer Neg Hx    Liver disease Neg Hx    Social History  reports that she has been smoking cigarettes. She has a 20 [name]-year smoking history. She has been exposed to tobacco smoke. She has never used smokeless tobacco. She reports that she does not drink alcohol  and does not use drugs. Allergies Allergies[1] Home medications Prior to Admission medications  Medication Sig Start Date End Date Taking? Authorizing Provider  albuterol  (PROVENTIL ) (2.5 MG/3ML) 0.083% nebulizer solution Take 2.5 mg by nebulization every 4 (four) hours as needed for wheezing or shortness of breath. 08/19/23   [provider]  ALPRAZolam  (XANAX ) 0.5 MG tablet Take 1 tablet (0.5 mg total) by mouth 3 (three) times daily as needed for anxiety. 10/02/21   Jillian Buttery, MD  aspirin  EC 81 MG tablet Take 81 mg by mouth daily. Swallow whole.    [provider]  clopidogrel  (PLAVIX ) 75 MG tablet Take 75 mg by mouth daily.    [provider]  docusate sodium  (COLACE) 100 MG capsule Take 100 mg by mouth daily as needed for mild constipation.    [provider]  DULoxetine  (CYMBALTA ) 30 MG capsule Take 30 mg by mouth daily. 06/10/23   [provider]  furosemide  (LASIX ) 40 MG tablet Take 40 mg by mouth 2 (two) times daily. 12/01/23   [provider]  gabapentin  (NEURONTIN ) 300 MG capsule Take 300 mg by mouth 3 (three) times daily as needed (pain).     [provider]  JARDIANCE  25 MG TABS tablet Take 25 mg by mouth daily. 10/29/23   [provider]  LANTUS  SOLOSTAR 100 UNIT/ML Solostar Pen Inject 40 Units into the skin 2 (two) times daily.    [provider]  linaclotide  (LINZESS ) 72 MCG capsule Take 72 mcg by mouth daily as needed (constipation).    [provider]  mexiletine (MEXITIL ) 200 MG capsule Take 1 capsule (200 mg total) by mouth 2 (two) times daily. 04/10/24   Debera Jayson MATSU, MD  ondansetron  (ZOFRAN ) 4 MG tablet Take 4 mg by mouth every 6 (six) hours as needed for nausea or vomiting.    [provider]  pantoprazole  (  PROTONIX ) 40 MG tablet Take 1 tablet (40 mg total) by mouth daily. 10/02/21   Jillian Buttery, MD  propranolol  ER (INDERAL  LA) 120 MG 24 hr capsule TAKE ONE CAPSULE BY MOUTH DAILY 04/18/24   Mealor, Augustus E, MD  rosuvastatin  (CRESTOR ) 40 MG tablet Take 40 mg by mouth every evening. 05/18/23   [provider]  sacubitril -valsartan  (ENTRESTO ) 24-26 MG TAKE 1 TABLET BY MOUTH TWICE DAILY 07/03/24   Debera Jayson MATSU, MD  topiramate  (TOPAMAX ) 50 MG tablet Take 50 mg by mouth 2 (two) times daily. 10/29/23   [provider]  triamcinolone cream (KENALOG) 0.5 % Apply 1 Application topically 2 (two) times daily. 01/10/24   [provider]  VENTOLIN  HFA 108 (90 Base) MCG/ACT inhaler Inhale 2 puffs into the lungs every 6 (six) hours as needed for shortness of breath or wheezing. 06/10/23   [provider]  vitamin B-12 1000 MCG tablet Take 1 tablet (1,000 mcg total) by mouth daily. 10/03/21   Jillian Buttery, MD     Vitals:   08/19/24 1637 08/19/24 2024  BP: 133/79 128/83  Pulse: (!) 59   Resp: (P) 18 18  Temp: 99.1 F (37.3 C) 98.8 F (37.1 C)  TempSrc: Axillary Oral  SpO2: 96%   Weight: (P) 60.2 kg    Exam Gen no distress, perseverates occasionally, pleasant otherwise Sclera anicteric, throat slightly dry No jvd or bruits, flat neck veins  at 25 degrees Chest clear bilat to bases, no rales or rhonchi RRR no MRG Abd soft ntnd no mass or ascites +bs GU - foley cath in place w/ clear light yellowish urine  Ext bilat AKA, no stump edema, no UE edema, no other edema Neuro is alert, nf   Home bp meds: Lasix  Propranolol  Entresto      Date   Creat  eGFR   AKI peak Cr  2013- 2016  0.49- 1.01 2023   1.22- 1.63 Jan-feb 2025  1.70- 2.18 26- 35 ml/min  2.35 June 2025  1.90  31 ml/min 1/20   2.03  29 1/21   2.15  27   1/22   3.07  17 1/23   3.79    08/19/24  4.22  12 ml/min (in [place]) 08/19/24  3.96  Here at [facility]    BP- bp's are stable here, no hypotension , 135/80 range I/O: not recorded  UA - pending UNa, UCr pending   Renal US -  pend CXR 1/24 -  IMPRESSION: Basilar airspace opacities, right greater than left. Enteric tube tip and side port project below the diaphragm. Labs today --> Na 139, K 4.5, CO2 13, bun 98, creat 3.96, Ca 8, alb 3.1 Tbili 0.5, LA 1.3, WBC 11K, Hb 11.1     Assessment/ Plan:  # AKI on CKD 3b - b/l creatinine 1.7- 2.1, from Jan- June 2025, eGFR 26- 35 ml/min - creat 2.0 on admit 1/20 in [place], since then has climbed to 4.2 on 1/24 - pt was diuresed on 1/20 and 1/21 for suspected CHF - AKI possibly due to diuresis +/- entresto  (ARB) - no hypotension, no contrast, bp's are stable around 135/80  - lasix  held 1/22 at [facility] due to rising creatinine - on exam today, pt is euvolemic, on RA, flat neck veins, no edema, slightly dry mouth - will start IV bicarb gtt at 85 cc/hr  - f/u labs in am   # acute on chronic HFrEF/ hx EF 35-40% - presented w/ high BNP, felt to have CHF -  thoracentesis deferred on presentation as was not hypoxic - they held lasix  on 1/22 due to rising creatinine - also aldactone and entresto  were stopped on 1/22  # metabolic acidosis - from AKI/ CKD - giving IVFs as isotonic sod bicarbonate at 85 cc/hr  # suspected acute CVA, R caudate - was unresponsive and made  comfort care, then pt responded and comfort care was cancelled - pt remains DNR-Limited      Sharon Fret  MD [facility] 08/19/2024, 8:35 PM  Recent Labs  Lab 08/19/24 1839  CREATININE 3.96*  K 4.5   Inpatient medications:  aspirin  EC  81 mg Oral Daily   clopidogrel   75 mg Oral Daily   heparin   5,000 Units Subcutaneous Q8H   [START ON 08/20/2024] insulin  aspart  0-15 Units Subcutaneous TID WC   insulin  aspart  0-5 Units Subcutaneous QHS   rosuvastatin   40 mg Oral QPM    acetaminophen  **OR** acetaminophen , albuterol , hydrALAZINE , ondansetron  **OR** ondansetron  (ZOFRAN ) IV      [1] No Known Allergies

## 2024-08-19 NOTE — Nursing Note (Signed)
" °   08/19/24 0920  Note Type  Note Type Treatment Note  Physical Therapy Session Duration  PT Individual [mins] 11  General  Activity Tolerance Limited by fatigue;Limited by Mental Status;Limited secondary to medical complications  Equipment / Environment Vascular access (PIV, TLC, Port-a-cath, PICC);Telemetry;Supplemental oxygen;Foley;NGT  Communication Preference Verbal  Patient reports No verbalizations from pt, very drowsy, unable to keep eyes open more than a few seconds.  Pain Comments No c/o pain.  Cognition  Cognition Inconsistently follows commands  Cognition comment Drowsy, opens eyes for a few seconds, intermittently able to squeeze bilateral hands  Orientation Disoriented to place;Disoriented to time;Disoriented to situation  Bed Mobility  Bed Mobility comments Unable to assess this date.  Transfers  Transfer comments Unable to assess.  Ambulation  Level of Assistance Other (Comment) (Pt does not ambulate at baseline.)  Distance Ambulated (ft) 0 ft  Ambulation comments Nonambulatory at baseline.  Additional interventions  Additional interventions Performed PROM BUE shoulder elevation, elbow flexion/extension, wrist flexion/extension x15 each.  Therapy Recommendations for Safe Patient Handling  Transfer  Mechanical Lift;2 person assist  Ambulation  (Pt is non-ambulatory at baseline.)  Wheelchair Power Wheelchair;2 person assist (via mechanical lift)  Bathroom Bedpan;2 person assist  Goals  SHORT GOAL #1 Patient will demonstrate all aspects of bed mobility Min A.  Date Established  08/18/24  Time Frame  2 weeks  Plan  In Progress  SHORT GOAL #2 Patient will maintain sitting balance at the EOB with Min A for >10 minutes.  Date Established  08/18/24  Time Frame  2 weeks  Plan  In Progress  SHORT GOAL #3 Patient demonstrate functional transfers with Mod A with use of least restrictive assistive device.  Date Established  08/18/24  Time Frame  2 weeks  Long Term Goal  #1 Patient demonstrate functional transfers with Min A with use of least restrictive assistive device.  Time Frame 4 weeks  Assessment  Problem List Decreased strength;Decreased range of motion;Decreased endurance;Decreased mobility;Fall risk;Decreased cognition;Decreased coordination;Impaired ADLs;Impaired balance  Assessment  Pt seen for PT treatment, however, participation limited by decreased mental status. Pt received supine in bed, very drowsy, unable to sustain eye opening for more than a few seconds. No verbalizations noted during session. Performed BUE PROM shoulder elevation, elbow flexion/extension, wrist flexion/extension x15 each. Able to intermittently demonstrate active grasp bilaterally. At end of session, pt supine in bed with HOB elevated, all lines in place, all needs met, RN aware. Continue to recommend SNF for short-term rehab upon discharge to maximize safety and functional independence.  End of Session  Patient at end of session All needs in reach;In bed;Lines intact;Notified Nurse;Staff present    "

## 2024-08-19 NOTE — Progress Notes (Addendum)
 Bolus tube feed given this morning. Discussed with Dr. Heath and patient will be transitioned to continuous tube feeds moving forward. Currently waiting for new order after nutritionist Debby Chum reviews chart.

## 2024-08-19 NOTE — Progress Notes (Signed)
 "   Transfer from [facility] to [facility] 08/19/2024   Patient: [name]                            PCP: Skillman, Katherine E, PA-C                    DOB: Jun 23, 1971                         DOA: 02/04/2024 [id number]                         DOS: 08/19/2024, 11:33 AM   LOS: 0 days    Presented to [facility] -  in stepdown unit  Brief Narrative:    MADISON DIRENZO is a 54 year old female with extensive history of HFrEF (30-35%), PVCs, HTN, PAD, bilateral AKA, CKD III, HLD, PVD,  Per hospitalist Dr. Marty Kin:  Presented to [facility] on August 15, 2024 with chief complaint of nausea vomiting: Patient progressed to change in mental status, became nonverbal with a left-sided weakness, with acute respiratory failure with O2 demand as high as 12 L HHF    Acute respiratory failure  Multifactorial including CHF exacerbation Improved from 12 L, to 2 L of oxygen via nasal cannula now satting 90% Chest x-ray consistent with pulmonary edema  Altered mental status-likely new stroke With left-sided weakness, encephalopathic -MRI could not be completed at the time CT of the head, MRI was completed: Revealing possible Right caudate infarct with no other no significant  findings  on MRA  Patient is already on aspirin /Plavix  and statin - NG tube in place-with tube feeding  Elevated troponin -as high as 293-per cardiology cardiac demand  Acute on chronic CHF exacerbation - on  diuretics Respiratory effort improving   Per hospitalist mental status improving, patient is waking up, more responsive  In the past 24 hours has developed Tmax of 103.5, worsening kidney function with a creatinine of 4.22, low urine output  -Hospital discussed the case with nephrologist Dr. Geralynn for progressive renal failure, oliguria Per family request an evaluation for possible dialysis  Nephrologist recommended patient to be transferred to [facility] - stepdown he will see the  patient in consultation  It appears the patient has new stroke, and developing cardiorenal syndrome, with toxic metabolic encephalopathy (multifactorial including new onset stroke)   During hospitalization hospitalist at [facility] discussed goal of care and CODE STATUS Patient was mad DNR/DNI, But since patient is now more responsive family would like more aggressive care open to PEG tube placement and possible hemodialysis.  Tmax over 24 hours: TMax 103.5 - pulse 70s, respiratory rate 11, blood pressure 141/62, currently satting 90% on 2 L of oxygen  Hospitalist requesting patient to be transferred to [facility] per nephrology recommendation  - There are no nephrologist or subspecialty available over the weekend at [name], unable to obtain MRI on the weekend  With Tmax of 103.5 blood cultures were obtained, patient started on broad-spectrum antibiotics of Zosyn (avoided vancomycin  due to AKI)   We strongly recommended patient to stay at [facility], proceed with palliative care. Unfortunately due to EMTALA higher level of care is needed with subspecialty not available at [facility]  Therefore on the above pretenses patient is accepted to stepdown unit at [facility],  Expecting nephrology consult on arrival cardiology consult, pulmonary  consult, and palliative care           Objective:     No intake or output data in the 24 hours ending 08/19/24 1133 Filed Weights   02/04/24 1034  Weight: 58.1 kg   ------------------------------------------------------------------------------------------------------------------------------------------    Previous LABs:     Latest Ref Rng & Units 01/17/2024   10:24 AM 08/31/2023    4:54 AM 08/30/2023    4:14 AM  CBC  WBC 3.4 - 10.8 x10E3/uL 9.6  6.5  6.4   Hemoglobin 11.1 - 15.9 g/dL 86.5  9.6  9.5   Hematocrit 34.0 - 46.6 % 42.3  29.8  29.5   Platelets 150 - 450 x10E3/uL 253  213  184       Latest Ref Rng & Units  01/17/2024   10:25 AM 09/02/2023    4:33 AM 09/01/2023    4:42 AM  CMP  Glucose 70 - 99 mg/dL 638  885  897   BUN 6 - 24 mg/dL 33  45  45   Creatinine 0.57 - 1.00 mg/dL 8.09  7.64  7.65   Sodium 134 - 144 mmol/L 132  130  134   Potassium 3.5 - 5.2 mmol/L 4.8  3.8  4.2   Chloride 96 - 106 mmol/L 98  84  88   CO2 20 - 29 mmol/L 18  31  32   Calcium  8.7 - 10.2 mg/dL 9.1  8.8  9.3    Last echo: August 07, 2024: Left ventricular ejection fraction, by estimation, is 35 to 40%.  Decreased LV function, global hypokinesis, no significant LVH, RV mildly enlarged with normal function. Left Atrium: Left atrial size was severely dilated.  Right Atrium: Right atrial size was normal in size.  Pericardium: A small pericardial effusion is present   Micro Results No results found for this or any previous visit (from the past 240 hours).  Radiology Reports No results found.  SIGNED: Adriana DELENA Grams, MD, FHM. FAAFP. Jolynn Pack - [facility] hospitalist Time spent - 55 min.  In seeing, evaluating and examining the patient. Reviewing medical records, labs, drawn plan of care. [facility],  Pager (please use [email or web address] to page/ text) Please use Epic Secure Chat for non-urgent communication (7AM-7PM)  If 7PM-7AM, please contact night-coverage [email or web address], 08/19/2024, 11:33 AM     "

## 2024-08-19 NOTE — H&P (Signed)
 " History and Physical    [name] FMW:[id number] DOB: [birth year] DOA: 08/19/2024  PCP: Sharon Comer BRAVO, PA-C  Patient coming from: [facility]  I have personally briefly reviewed patient's old medical records in [facility]  Chief Complaint: AMS  HPI: [name] is a 54 y.o. female with medical history significant of extensive history of HFrEF (30-35%), PVCs, tobacco abuse, HTN, PAD, bilateral AKA, CKD III, HLD, PVD transferred from [facility] for evaulation of progressive renal failure.  Patient was admitted to [facility] on 1/20 stepdown/ICU unit with   Acute on chronic congestive heart failure exacerbation, Altered mental status with left-sided weakness concerning for stroke,  Cardiorenal syndrome with acute kidney injury (progressive oliguria and developing renal failure), and elevated troponin consistent with type 2 myocardial infarction.   Transfered to [facility] is requested for subspecialty consultation and multidisciplinary management, including nephrology, cardiology, pulmonary, and palliative care services, which were not available at [facility].  Patient is not good historian.  History gathered from charts.  Apparently patient became less responsive and noted to have left upper extremity weakness.  Code stroke was called.  CT head was done that showed focal hypodensity in the right caudate body and adjacent white matter concerning for infarction.  Patient was already on aspirin , Plavix  and statin.  Neurology recommended Keppra in case mental status due to seizure rather than a stroke.  Patient was started on Keppra.  Patient continued to have significant clinically declined was requiring high flow nasal cannula 12 L, poor urinary output.  Troponin trended up to 293.  CODE STATUS was discussed with patient's family and patient made DNR/DNI.  NG tube was placed.  On 1/24: Urine output declined significantly with worsening AKI with creatinine up to  4.22, K: 5.4, bicarb 13.8 and phosphorus of 6.5.  Patient was therefore transferred to [facility] for further evaluation and management.  Review of Systems: As per HPI otherwise negative.    Past Medical History:  Diagnosis Date   Anemia 02/05/2012   Anxiety    Arthritis    Asthma    B12 deficiency    CKD (chronic kidney disease) stage 3, GFR 30-59 ml/min (HCC) 09/25/2021   COPD (chronic obstructive pulmonary disease) (HCC)    Depression    Diabetes (HCC)    Elevated LFTs    Essential hypertension    Fatty liver    Folate deficiency    GERD (gastroesophageal reflux disease)    Headache(784.0)    Heart failure (HCC)    PAD (peripheral artery disease)    Status post bilateral AKA   PE (pulmonary embolism)    Xarelto   Pneumonia 2013   PVC (premature ventricular contraction)    Sepsis (HCC)    Type 2 diabetes mellitus (HCC)    Vitamin D deficiency     Past Surgical History:  Procedure Laterality Date   ABDOMINAL AORTOGRAM W/LOWER EXTREMITY N/A 09/05/2021   Procedure: ABDOMINAL AORTOGRAM W/LOWER EXTREMITY;  Surgeon: Magda Debby SAILOR, MD;  Location: [facility] INVASIVE CV LAB;  Service: Cardiovascular;  Laterality: N/A;   AMPUTATION Right 09/24/2021   Procedure: RIGHT ABOVE KNEE AMPUTATION;  Surgeon: Magda Debby SAILOR, MD;  Location: [facility] OR;  Service: Vascular;  Laterality: Right;   APPENDECTOMY  2013   CHOLECYSTECTOMY     DIAGNOSTIC LAPAROSCOPY     ESOPHAGEAL DILATION N/A 10/09/2014   Procedure: ESOPHAGEAL DILATION;  Surgeon: Margo LITTIE Haddock, MD;  Location: [facility] ORS;  Service: Endoscopy;  Laterality: N/A;  #  15 and #16 savory   ESOPHAGOGASTRODUODENOSCOPY (EGD) WITH PROPOFOL  N/A 10/09/2014   Dr. Harvey: mild erosive gastritis, Savary dilation. Nausea and vomiting most likely due to uncontroleld blood sugars, reflux, and gastritis.    PVC ABLATION N/A 02/04/2024   Procedure: PVC ABLATION;  Surgeon: Nancey Eulas BRAVO, MD;  Location: [facility] INVASIVE CV LAB;  Service: Cardiovascular;   Laterality: N/A;     reports that she has been smoking cigarettes. She has a 20 pack-year smoking history. She has been exposed to tobacco smoke. She has never used smokeless tobacco. She reports that she does not drink alcohol  and does not use drugs.  Allergies[1]  Family History  Problem Relation Age of Onset   Breast cancer Mother    Kidney disease Sister    Diabetes Sister    Breast cancer Sister    Diabetes Brother    Colon cancer Neg Hx    Liver disease Neg Hx     Prior to Admission medications  Medication Sig Start Date End Date Taking? Authorizing Provider  albuterol  (PROVENTIL ) (2.5 MG/3ML) 0.083% nebulizer solution Take 2.5 mg by nebulization every 4 (four) hours as needed for wheezing or shortness of breath. 08/19/23   [provider]  ALPRAZolam  (XANAX ) 0.5 MG tablet Take 1 tablet (0.5 mg total) by mouth 3 (three) times daily as needed for anxiety. 10/02/21   Jillian Buttery, MD  aspirin  EC 81 MG tablet Take 81 mg by mouth daily. Swallow whole.    [provider]  clopidogrel  (PLAVIX ) 75 MG tablet Take 75 mg by mouth daily.    [provider]  docusate sodium  (COLACE) 100 MG capsule Take 100 mg by mouth daily as needed for mild constipation.    [provider]  DULoxetine  (CYMBALTA ) 30 MG capsule Take 30 mg by mouth daily. 06/10/23   [provider]  furosemide  (LASIX ) 40 MG tablet Take 40 mg by mouth 2 (two) times daily. 12/01/23   [provider]  gabapentin  (NEURONTIN ) 300 MG capsule Take 300 mg by mouth 3 (three) times daily as needed (pain).    [provider]  JARDIANCE  25 MG TABS tablet Take 25 mg by mouth daily. 10/29/23   [provider]  LANTUS  SOLOSTAR 100 UNIT/ML Solostar Pen Inject 40 Units into the skin 2 (two) times daily.    [provider]  linaclotide  (LINZESS ) 72 MCG capsule Take 72 mcg by mouth daily as needed (constipation).    [provider]  mexiletine (MEXITIL ) 200 MG  capsule Take 1 capsule (200 mg total) by mouth 2 (two) times daily. 04/10/24   Debera Jayson MATSU, MD  ondansetron  (ZOFRAN ) 4 MG tablet Take 4 mg by mouth every 6 (six) hours as needed for nausea or vomiting.    [provider]  pantoprazole  (PROTONIX ) 40 MG tablet Take 1 tablet (40 mg total) by mouth daily. 10/02/21   Jillian Buttery, MD  propranolol  ER (INDERAL  LA) 120 MG 24 hr capsule TAKE ONE CAPSULE BY MOUTH DAILY 04/18/24   Mealor, Augustus E, MD  rosuvastatin  (CRESTOR ) 40 MG tablet Take 40 mg by mouth every evening. 05/18/23   [provider]  sacubitril -valsartan  (ENTRESTO ) 24-26 MG TAKE 1 TABLET BY MOUTH TWICE DAILY 07/03/24   Debera Jayson MATSU, MD  topiramate  (TOPAMAX ) 50 MG tablet Take 50 mg by mouth 2 (two) times daily. 10/29/23   [provider]  triamcinolone cream (KENALOG) 0.5 % Apply 1 Application topically 2 (two) times daily. 01/10/24   [provider]  VENTOLIN  HFA 108 (90 Base) MCG/ACT inhaler Inhale 2 puffs into the lungs every 6 (six) hours as needed for shortness of breath or wheezing. 06/10/23   [provider]  vitamin B-12 1000 MCG tablet Take 1 tablet (1,000 mcg total) by mouth daily. 10/03/21   Jillian Buttery, MD    Physical Exam: Vitals:   08/19/24 1637  BP: 133/79  Pulse: (!) 59  Resp: (P) 18  Temp: 99.1 F (37.3 C)  TempSrc: Axillary  SpO2: 96%  Weight: (P) 60.2 kg    Constitutional: NAD, calm, comfortable, on nasal cannula, has NG tube, sleepy but arousable, confused Eyes: PERRL, lids and conjunctivae normal ENMT: Mucous membranes are moist. Posterior pharynx clear of any exudate or lesions.Normal dentition.  Neck: normal, supple, no masses, no thyromegaly Respiratory: Bilateral coarse breath sounds positive Cardiovascular: Regular rate and rhythm, no murmurs / rubs / gallops.  Abdomen: no tenderness, no masses palpated. No hepatosplenomegaly. Bowel sounds positive.  Musculoskeletal: Bilateral AKA  skin: no  rashes, lesions, ulcers. No induration Neurologic: Somnolent but arousable.  Confused.  Following commands intermittently.  Left arm weakness noted  Labs on Admission: I have personally reviewed following labs and imaging studies  CBC: No results for input(s): WBC, NEUTROABS, HGB, HCT, MCV, PLT in the last 168 hours. Basic Metabolic Panel: No results for input(s): NA, K, CL, CO2, GLUCOSE, BUN, CREATININE, CALCIUM , MG, PHOS in the last 168 hours. GFR: CrCl cannot be calculated (Patient's most recent lab result is older than the maximum 21 days allowed.). Liver Function Tests: No results for input(s): AST, ALT, ALKPHOS, BILITOT, PROT, ALBUMIN in the last 168 hours. No results for input(s): LIPASE, AMYLASE in the last 168 hours. No results for input(s): AMMONIA in the last 168 hours. Coagulation Profile: No results for input(s): INR, PROTIME in the last 168 hours. Cardiac Enzymes: No results for input(s): CKTOTAL, CKMB, CKMBINDEX, TROPONINI in the last 168 hours. BNP (last 3 results) No results for input(s): PROBNP in the last 8760 hours. HbA1C: No results for input(s): HGBA1C in the last 72 hours. CBG: No results for input(s): GLUCAP in the last 168 hours. Lipid Profile: No results for input(s): CHOL, HDL, LDLCALC, TRIG, CHOLHDL, LDLDIRECT in the last 72 hours. Thyroid  Function Tests: No results for input(s): TSH, T4TOTAL, FREET4, T3FREE, THYROIDAB in the last 72 hours. Anemia Panel: No results for input(s): VITAMINB12, FOLATE, FERRITIN, TIBC, IRON, RETICCTPCT in the last 72 hours. Urine analysis:    Component Value Date/Time   COLORURINE YELLOW 08/23/2023 0005   APPEARANCEUR CLEAR 08/23/2023 0005   LABSPEC 1.008 08/23/2023 0005   PHURINE 5.0 08/23/2023 0005   GLUCOSEU NEGATIVE 08/23/2023 0005   HGBUR NEGATIVE 08/23/2023 0005   BILIRUBINUR NEGATIVE 08/23/2023 0005   KETONESUR  NEGATIVE 08/23/2023 0005   PROTEINUR 100 (A) 08/23/2023 0005   NITRITE NEGATIVE 08/23/2023 0005   LEUKOCYTESUR MODERATE (A) 08/23/2023 0005    Radiological Exams on Admission: No results found.   Assessment/Plan  Acute multiple ischemic infarction: - CT head showed focal hypodensity in the right caudate body and adjacent white matter concerning for infarction. -MRI brain shows many acute infarction in bilateral frontal, parietal and occipital lobes, right basal ganglia, left thalamus and left cerebellum.  Consider embolic etiology. -Consulted neurology. - Has NG tube - Allow permissive hypertension.  Monitor on telemetry - Echo done at outside facility on 08/15/2024 that showed EF of 35 to 40%.  - Consult PT/OT/SLP. -Will continue aspirin , statin and Plavix  per NG tube  Acute  on chronic HFrEF -Echo done on 08/15/2024 that showed EF of 35 to 40% -Currently on room air -Consulted cardiology-appreciate help.  Patient appears euvolemic on exam.  BNP significantly elevated.  Strict INO's and daily weight. -Monitor electrolytes.   -Was on Lasix , Aldactone and Entresto  however it was held on 1/20 due to worsening renal function. - Defer diuretics to cardiology and nephrology  Fever: Patient had a fever overnight at [facility].  Per notes: UA was negative.  Chest x-ray was negative for pneumonia.  Lactate was within normal limit.  Blood culture was ordered and was pending at the discharge.  She was started on Zosyn to cover in case for HCAP.   -Patient is currently afebrile.  Will monitor closely.   -Blood culture, lactate, chest x-ray is ordered and is pending.    AKI on CKD stage IIIb: High anion gap metabolic acidosis: - Likely in the setting of excessive diuresis. -Cr. up to 4.22,, GFR: 12, K: 5.4, bicarb 13.8 and phosphorus 6.5.  Outside facility - Consulted nephrology-appreciate help.  Check lactic acid - Monitor renal function.  Avoid nephrotoxic medications - Start IV bicarb  gtt. per nephrology  CAD Elevated troponin -Likely demand ischemia in the setting of worsening renal function and heart failure. - Will continue aspirin , Plavix  and statin  Severe PAD Bilateral AKA: Carotid artery stenosis -Continue aspirin , Plavix  and statin  Uncontrolled type 2 diabetes: Last A1c noted to be 10.1.  Start sliding scale insulin .  Tobacco dependence: Counseled about cessation  Anemia of chronic disease: Likely in the setting of CKD.  H&H noted to be 11.2/33.9.  Will continue to monitor  Hyperphosphatemia - 6.5.  Repeat labs  Hypercholesteremia: LDL noted to be 119.  Continue statin  DVT prophylaxis: Heparin  Code Status: DNR/DNI-confirmed with patient's sister Family Communication: None present at bedside.   I called patient's sister [name] and discussed plan of care and she verbalized understanding.  Disposition Plan: To be determined Consults called: Cardiology Nephrology Palliative Neurology Admission status: Inpatient   Velna JONELLE Skeeter MD [facility]  If 7PM-7AM, please contact night-coverage [email or web address]  08/19/2024, 5:50 PM        [1] No Known Allergies  "

## 2024-08-20 ENCOUNTER — Inpatient Hospital Stay (HOSPITAL_COMMUNITY)

## 2024-08-20 DIAGNOSIS — R7989 Other specified abnormal findings of blood chemistry: Secondary | ICD-10-CM | POA: Diagnosis not present

## 2024-08-20 DIAGNOSIS — E1151 Type 2 diabetes mellitus with diabetic peripheral angiopathy without gangrene: Secondary | ICD-10-CM | POA: Diagnosis not present

## 2024-08-20 DIAGNOSIS — I130 Hypertensive heart and chronic kidney disease with heart failure and stage 1 through stage 4 chronic kidney disease, or unspecified chronic kidney disease: Secondary | ICD-10-CM

## 2024-08-20 DIAGNOSIS — Z6833 Body mass index (BMI) 33.0-33.9, adult: Secondary | ICD-10-CM

## 2024-08-20 DIAGNOSIS — N1832 Chronic kidney disease, stage 3b: Secondary | ICD-10-CM | POA: Diagnosis not present

## 2024-08-20 DIAGNOSIS — Z794 Long term (current) use of insulin: Secondary | ICD-10-CM | POA: Diagnosis not present

## 2024-08-20 DIAGNOSIS — J9601 Acute respiratory failure with hypoxia: Secondary | ICD-10-CM | POA: Diagnosis not present

## 2024-08-20 DIAGNOSIS — E669 Obesity, unspecified: Secondary | ICD-10-CM

## 2024-08-20 DIAGNOSIS — I639 Cerebral infarction, unspecified: Secondary | ICD-10-CM

## 2024-08-20 DIAGNOSIS — I69391 Dysphagia following cerebral infarction: Secondary | ICD-10-CM | POA: Diagnosis not present

## 2024-08-20 DIAGNOSIS — Z7982 Long term (current) use of aspirin: Secondary | ICD-10-CM | POA: Diagnosis not present

## 2024-08-20 DIAGNOSIS — I6389 Other cerebral infarction: Secondary | ICD-10-CM | POA: Diagnosis not present

## 2024-08-20 DIAGNOSIS — Z7902 Long term (current) use of antithrombotics/antiplatelets: Secondary | ICD-10-CM

## 2024-08-20 DIAGNOSIS — E1122 Type 2 diabetes mellitus with diabetic chronic kidney disease: Secondary | ICD-10-CM

## 2024-08-20 DIAGNOSIS — I509 Heart failure, unspecified: Secondary | ICD-10-CM | POA: Diagnosis not present

## 2024-08-20 DIAGNOSIS — I6340 Cerebral infarction due to embolism of unspecified cerebral artery: Secondary | ICD-10-CM | POA: Diagnosis not present

## 2024-08-20 LAB — URINALYSIS, ROUTINE W REFLEX MICROSCOPIC
Bilirubin Urine: NEGATIVE
Glucose, UA: >=500 mg/dL — AB
Ketones, ur: 5 mg/dL — AB
Leukocytes,Ua: NEGATIVE
Nitrite: NEGATIVE
Protein, ur: >=300 mg/dL — AB
Specific Gravity, Urine: 1.016 (ref 1.005–1.030)
pH: 5 (ref 5.0–8.0)

## 2024-08-20 LAB — BASIC METABOLIC PANEL WITH GFR
Anion gap: 17 — ABNORMAL HIGH (ref 5–15)
BUN: 96 mg/dL — ABNORMAL HIGH (ref 6–20)
CO2: 15 mmol/L — ABNORMAL LOW (ref 22–32)
Calcium: 7.8 mg/dL — ABNORMAL LOW (ref 8.9–10.3)
Chloride: 110 mmol/L (ref 98–111)
Creatinine, Ser: 3.72 mg/dL — ABNORMAL HIGH (ref 0.44–1.00)
GFR, Estimated: 14 mL/min — ABNORMAL LOW
Glucose, Bld: 192 mg/dL — ABNORMAL HIGH (ref 70–99)
Potassium: 4.2 mmol/L (ref 3.5–5.1)
Sodium: 142 mmol/L (ref 135–145)

## 2024-08-20 LAB — GLUCOSE, CAPILLARY
Glucose-Capillary: 131 mg/dL — ABNORMAL HIGH (ref 70–99)
Glucose-Capillary: 148 mg/dL — ABNORMAL HIGH (ref 70–99)
Glucose-Capillary: 152 mg/dL — ABNORMAL HIGH (ref 70–99)
Glucose-Capillary: 156 mg/dL — ABNORMAL HIGH (ref 70–99)
Glucose-Capillary: 180 mg/dL — ABNORMAL HIGH (ref 70–99)
Glucose-Capillary: 212 mg/dL — ABNORMAL HIGH (ref 70–99)

## 2024-08-20 LAB — CBC
HCT: 34.7 % — ABNORMAL LOW (ref 36.0–46.0)
Hemoglobin: 11.2 g/dL — ABNORMAL LOW (ref 12.0–15.0)
MCH: 30.2 pg (ref 26.0–34.0)
MCHC: 32.3 g/dL (ref 30.0–36.0)
MCV: 93.5 fL (ref 80.0–100.0)
Platelets: 138 10*3/uL — ABNORMAL LOW (ref 150–400)
RBC: 3.71 MIL/uL — ABNORMAL LOW (ref 3.87–5.11)
RDW: 16 % — ABNORMAL HIGH (ref 11.5–15.5)
WBC: 10.7 10*3/uL — ABNORMAL HIGH (ref 4.0–10.5)
nRBC: 0 % (ref 0.0–0.2)

## 2024-08-20 LAB — ECHOCARDIOGRAM LIMITED
Calc EF: 53.7 %
S' Lateral: 4.4 cm
Single Plane A2C EF: 52.5 %
Single Plane A4C EF: 51.4 %
Weight: 2116.42 [oz_av]

## 2024-08-20 MED ORDER — STERILE WATER FOR INJECTION IV SOLN
INTRAVENOUS | Status: DC
  Filled 2024-08-20: qty 150
  Filled 2024-08-20: qty 1000
  Filled 2024-08-20: qty 150
  Filled 2024-08-20: qty 1000

## 2024-08-20 MED ORDER — CHLORHEXIDINE GLUCONATE CLOTH 2 % EX PADS
6.0000 | MEDICATED_PAD | Freq: Every day | CUTANEOUS | Status: DC
  Administered 2024-08-20 – 2024-08-23 (×4): 6 via TOPICAL

## 2024-08-20 MED ORDER — INSULIN GLARGINE 100 UNIT/ML ~~LOC~~ SOLN
15.0000 [IU] | Freq: Every day | SUBCUTANEOUS | Status: DC
  Administered 2024-08-20 – 2024-08-22 (×3): 15 [IU] via SUBCUTANEOUS
  Filled 2024-08-20 (×3): qty 0.15

## 2024-08-20 MED ORDER — ALPRAZOLAM 0.5 MG PO TABS
0.5000 mg | ORAL_TABLET | Freq: Three times a day (TID) | ORAL | Status: DC | PRN
  Administered 2024-08-26: 0.5 mg via ORAL
  Filled 2024-08-20: qty 1

## 2024-08-20 MED ORDER — GLUCERNA 1.2 CAL PO LIQD
1000.0000 mL | ORAL | Status: DC
  Administered 2024-08-20: 1000 mL
  Filled 2024-08-20 (×2): qty 1000

## 2024-08-20 NOTE — Progress Notes (Signed)

## 2024-08-20 NOTE — Progress Notes (Signed)
 " PROGRESS NOTE  [name]  FMW:[id number] DOB: [birth year] DOA: 08/19/2024 PCP: Jeanette Comer BRAVO, PA-C   Brief Narrative: Patient is a 54 year old female with history of HFrEF, PVCs, tobacco use, hypertension, peripheral artery disease, bilateral AKA, CKD stage IIIb, hyperlipidemia who was transferred from [facility] for  further management of progressive renal failure.  She was initially admitted there on 1/20 and was currently being managed for acute on chronic CHF exacerbation, AMS, left-sided weakness, concern for stroke, progressive renal failure, elevated troponin.  On presentation to there, she was confused, code stroke was called.  CT head showed focal hypodensity in the right caudate body and adjacent white matter concerning for stroke.  She continued to decline and was requested to transfer here.  Nephrology, neurology, cardiology, palliative care consulted here.  Assessment & Plan:  Principal Problem:   Acute hypoxemic respiratory failure (HCC)  Acute ischemic stroke: MRI showed scattered infarcts including bilateral frontal, parietal, occipital, basal ganglia, left thalamus and cerebellum.  Suspected embolic phenomena.  Neurology following.  Currently on aspirin , statin, Plavix , PT/OT/speech consulted  Altered mentation/dysphagia: Suspected to be from a stroke versus seizure.  Was started on Keppra at [facility], now not on keppra Patient is dysphasic.  Speech therapy following.  Currently has NG tube.  UA does not suggest UTI.  We may need to start on tube feeding.  Will consult dietitian for core track.  Acute on chronic HFrEF: Echo done on 08/07/2024 showed EF of 35 to 40%.  Repeat echo done here showed EF of 50 to 55%, low normal left ventricular function, severely dilated left ventricular cavity, eccentric left ventricular hypertrophy, mild mitral valve regurgitation .cardiology already consulted and following.  BNP was elevated.  Currently appears euvolemic.  Earlier  on Lasix , Aldactone, Entresto , currently on hold due to worsening renal function.  AKI in CKD stage IIIb/AGMA: Creatinine was elevated up to 4.2, now trending down.  Nephrology following.  On bicarb drip.  Looks like her baseline creatinine is around 2.3  History of coronary artery disease/elevated troponin: Troponin elevation likely from worsening renal function and heart failure.  On aspirin , Plavix , statin.   History of peripheral artery disease/carotid artery stenosis: Status post bilateral BKA  Uncontrolled diabetes type 2: Last A1c noted to be 10.1.  Currently on sliding scale.  Monitor blood sugars.  Diabetic monitor consulted.  She takes insulin  at home.  Restarted Lantus  at 15 units,Daily.  Will adjust dose as appropriate.  Anemia of chronic disease: In the setting of CKD.  Currently hemoglobin stable  Hyperlipidemia: On statin.  LDL of 119  Tobacco dependence: Counseled for cessation  Goals of care: Patient with multiple comorbidities now with altered mentation, significant acute ischemic stroke, renal failure.  CODE STATUS DNR/DNI.  Palliative care consulted for goals of care.  She has bilateral AKA.  Poor quality of life.  Feeding initiated by NG tube.  If her mentation does not improve, she may be a candidate for comfort care  Wound 08/19/24 Pressure Injury Sacrum Deep Tissue Pressure Injury - Purple or maroon localized area of discolored intact skin or blood-filled blister due to damage of underlying soft tissue from pressure and/or shear. (Active)           DVT prophylaxis:heparin  injection 5,000 Units Start: 08/19/24 2200 SCDs Start: 08/19/24 1749     Code Status: Limited: Do not attempt resuscitation (DNR) -DNR-LIMITED -Do Not Intubate/DNI   Family Communication: None at the bedside  Patient status:Inpatient  Patient is from :home  Anticipated discharge to:not sure  Estimated DC date:not sure   Consultants: Cardiology, nephrology, neurology  Procedures:  None yet  Antimicrobials:  Anti-infectives (From admission, onward)    None       Subjective: Patient seen and examined at bedside today.  Lying on bed.  Chronically deconditioned, weak.  Opens her eyes on calling her name but is nonverbal and does not obey commands.  Not in any kind of distress.  Hemodynamically stable  Objective: Vitals:   08/20/24 0046 08/20/24 0049 08/20/24 0349 08/20/24 0452  BP: (!) 152/64 (!) 152/64    Pulse: (!) 55 (!) 55 (!) 55   Resp:  16 18   Temp:  97.9 F (36.6 C) 97.7 F (36.5 C)   TempSrc:  Oral Oral   SpO2: 96% 95% 94%   Weight:    60 kg    Intake/Output Summary (Last 24 hours) at 08/20/2024 0746 Last data filed at 08/20/2024 0456 Gross per 24 hour  Intake 259.18 ml  Output 500 ml  Net -240.82 ml   Filed Weights   08/19/24 1637 08/19/24 2134 08/20/24 0452  Weight: (P) 60.2 kg 59.5 kg 60 kg    Examination:  General exam: Overall comfortable, not in distress, chronically ill looking, weak, deconditioned, lying on bed HEENT: PERRL Respiratory system:  no wheezes or crackles  Cardiovascular system: S1 & S2 heard, RRR.  Gastrointestinal system: Abdomen is nondistended, soft and nontender. Central nervous system: Awake but not alert or oriented Extremities: Bilateral AKA Skin: pressure ulcer as above   Data Reviewed: I have personally reviewed following labs and imaging studies  CBC: Recent Labs  Lab 08/19/24 1839 08/20/24 0312  WBC 11.5* 10.7*  NEUTROABS 8.8*  --   HGB 11.1* 11.2*  HCT 34.6* 34.7*  MCV 95.3 93.5  PLT 154 138*   Basic Metabolic Panel: Recent Labs  Lab 08/19/24 1839 08/19/24 1848 08/20/24 0312  NA 139  --  142  K 4.5  --  4.2  CL 108  --  110  CO2 13*  --  15*  GLUCOSE 259*  --  192*  BUN 98*  --  96*  CREATININE 3.96*  --  3.72*  CALCIUM  8.0*  --  7.8*  MG  --  2.3  --   PHOS  --  6.1*  --      No results found for this or any previous visit (from the past 240 hours).   Radiology  Studies: DG Abd 1 View Result Date: 08/20/2024 EXAM: 1 VIEW XRAY OF THE ABDOMEN 08/20/2024 12:44:43 AM COMPARISON: None available. CLINICAL HISTORY: Nasogastric tube present. FINDINGS: LINES, TUBES AND DEVICES: Enteric tube in place with distal tip within the stomach, the proximal side port lies at the gastroesophageal junction. This should be advanced deeper into the stomach. BOWEL: Nonobstructive bowel gas pattern. Scattered large and small bowel gas is noted. No free air is seen. SOFT TISSUES: Cholecystectomy clips noted. BONES: No acute fracture. IMPRESSION: 1. Enteric tube in place with distal tip within the stomach and proximal side port at the gastroesophageal junction; this should be advanced deeper into the stomach. 2. No free air. Electronically signed by: Oneil Devonshire MD 08/20/2024 12:47 AM EST RP Workstation: [id number]   MR BRAIN WO CONTRAST Result Date: 08/19/2024 EXAM: MRI Brain Without Contrast 08/19/2024 10:13:21 PM TECHNIQUE: Multiplanar multisequence MRI of the head/brain was performed without the administration of intravenous contrast. COMPARISON: None available. CLINICAL HISTORY: Neuro deficit, acute, stroke suspected Acute neurological deficit, stroke suspected.  FINDINGS: Motion limited and incomplete study.  No SWI and no T2.  Within this limitation: BRAIN AND VENTRICLES: Significantly motion limited study with many acute infarcts in bilateral frontal, parietal and occipital lobes, right basal ganglia, left thalamus and left cerebellum. No obvious intracranial hemorrhage. No mass. No midline shift. No hydrocephalus. The sella is unremarkable. Normal flow voids. ORBITS: No significant abnormality. SINUSES AND MASTOIDS: No significant abnormality. BONES AND SOFT TISSUES: Normal marrow signal. No soft tissue abnormality. IMPRESSION: 1. Significantly motion limited and incomplete study with many acute infarcts in bilateral frontal, parietal and occipital lobes, right basal ganglia, left  thalamus and left cerebellum. Consider embolic etiology. Electronically signed by: Glendia Molt MD 08/19/2024 10:29 PM EST RP Workstation: [id number]   DG CHEST PORT 1 VIEW Result Date: 08/19/2024 EXAM: 1 VIEW(S) XRAY OF THE CHEST 08/19/2024 06:54:00 PM COMPARISON: 08/22/2023 CLINICAL HISTORY: Cough. FINDINGS: LINES, TUBES AND DEVICES: Enteric tube in place with tip and side port below the hemidiaphragm. LUNGS AND PLEURA: Basilar airspace opacities are noted, right greater than left. No pleural effusion. No pneumothorax. HEART AND MEDIASTINUM: Cardiomegaly. Aortic calcification. BONES AND SOFT TISSUES: No acute osseous abnormality. IMPRESSION: 1. Basilar airspace opacities, right greater than left. 2. Enteric tube tip and side port project below the diaphragm. Electronically signed by: Oneil Devonshire MD 08/19/2024 07:03 PM EST RP Workstation: [id number]    Scheduled Meds:   stroke: early stages of recovery book   Does not apply Once   aspirin   81 mg Per Tube Daily   clopidogrel   75 mg Per Tube Daily   heparin   5,000 Units Subcutaneous Q8H   insulin  aspart  0-15 Units Subcutaneous TID WC   insulin  aspart  0-5 Units Subcutaneous QHS   rosuvastatin   40 mg Per Tube QPM   Continuous Infusions:  sodium bicarbonate  150 mEq in sterile water  1,150 mL infusion 85 mL/hr at 08/20/24 0351     LOS: 1 day   Ivonne Mustache, MD [facility] P1/25/2026, 7:46 AM  "

## 2024-08-20 NOTE — Progress Notes (Signed)
 "  Progress Note  Patient Name: [name] Date of Encounter: 08/20/2024 [facility] Cardiologist: Jayson Sierras, MD   Interval Summary   Almost completely aphasic, but does answer with nods of the head.  I think she does comprehend my questions but I am not sure.  Does not appear to have any respiratory difficulty or any distress.  Lying supine with head of bed elevation at 30 degrees.  Has mittens on.  Receiving tube feeds.  Vital Signs Vitals:   08/20/24 0349 08/20/24 0452 08/20/24 0809 08/20/24 1219  BP:   (!) 140/54 (!) 147/61  Pulse: (!) 55  (!) 57 (!) 55  Resp: 18  18   Temp: 97.7 F (36.5 C)  98 F (36.7 C) 97.6 F (36.4 C)  TempSrc: Oral  Axillary Axillary  SpO2: 94%  98% 98%  Weight:  60 kg      Intake/Output Summary (Last 24 hours) at 08/20/2024 1550 Last data filed at 08/20/2024 0456 Gross per 24 hour  Intake 259.18 ml  Output 500 ml  Net -240.82 ml      08/20/2024    4:52 AM 08/19/2024    9:34 PM 08/19/2024    4:37 PM  Last 3 Weights  Weight (lbs) 132 lb 4.4 oz 131 lb 2.8 oz 132 lb 11.5 oz  Weight (kg) 60 kg 59.5 kg 60.2 kg      Telemetry/ECG  Normal sinus rhythm- Personally Reviewed  I reviewed her echocardiogram and I largely agree with the findings except I think the EF is lower at about 40-45% and I think that there is a distinct wall motion abnormality with hypokinesis in the distribution of the right coronary artery.  Physical Exam  GEN: No acute distress.   Neck: No JVD Cardiac: RRR, no murmurs, rubs, or gallops.  Respiratory: Clear to auscultation bilaterally. GI: Soft, nontender, non-distended  MS: Lateral AKA  Assessment & Plan  54 year old woman with a history of heart failure with reduced ejection fraction, low-flow low gradient aortic stenosis (probably mild or moderate), moderate mitral insufficiency, history of high burden of PVCs (unable to perform ablation due to lack of PVCs during the procedure), chronic kidney disease  stage III, hyperlipidemia, DM type II, history of COPD and pulmonary embolism, PAD with bilateral above-the-knee amputations, admitted with a recent stroke and evidence of heart failure exacerbation.  After diuresis her heart failure seems to be well compensated.  She is breathing quite comfortably lying almost fully supine.  She seems to be clinically euvolemic.  Renal parameters, which had markedly worsened yesterday are at least stable, maybe with a slight improving trend today.  I do not think she should have additional diuretics today.  I reviewed her echocardiogram and in my opinion there is evidence of ischemia or infarction in the territory of the right coronary artery with hypokinesis in the basal segments of the inferior septum and inferior wall..  However, overall there is evidence of significant improvement in overall left ventricular systolic function with an ejection fraction that is now approximately 45-50%.  Mitral inflow Doppler was not performed on the current study to assess left heart filling pressures, but the inferior vena cava does not appear to be dilated.  Level of suspicion for true acute atherothrombotic coronary syndrome is low.  The minimal increase in high-sensitivity troponin can readily be attributed to demand ischemia/injury.  Will monitor closely for development of atrial fibrillation, which could explain her stroke.  Numerous comorbid conditions make her a very  poor candidate for invasive coronary evaluation or treatment as she would not be a candidate for any valvular interventions.  For this reason transesophageal echocardiography will also be of low yield.    For questions or updates, please contact  [facility] Please consult [email or web address] for contact info under         Signed, Jerel Balding, MD   "

## 2024-08-20 NOTE — Progress Notes (Signed)
 [facility] Progress Note  Subjective:  Seen in room Takes a while to wake her up UOP 500 cc yesterday  Presentation summary: 54 y.o. year-old w/ PMH significant for anemia, asthma, CKD 3, COPD, depression, diabetes, HTN, fatty liver, HFrEF EF 35 to 40%, PAD status post bilateral AKA, H/O PE, who presented to [facility] on 08/15/2024 brought in by family saying that she had been sick for the past month or so with intermittent nausea vomiting and diarrhea and reduced p.o. intake.  Over the last couple of days her urine output had reduced as well as her p.o. intake and she developed abdominal pain.  In the ED BP was greater than 70,000, creatinine 2.0, CT chest showed moderate right and mild to moderate left pleural effusions with scattered ground glass representing possible edema.  Patient was admitted for IV diuresis and IV antibiotics for UTI.  Her Entresto  and Aldactone and Jardiance  were continued.  January 21, patient became less responsive and nonverbal throughout the day.  She was able to make eye contact but then became completely unresponsive not moving the left arm to painful stimulus.  A code stroke was called and a CT head showed infarct of the right caudate.  Patient was moved to ICU.  IV Lasix  was continued.  The next day patient had poor urine output and rising creatinine.  Oxygen was required.  Patient was felt to be declining and family conversations were made and they felt that she was approaching end-of-life.  She was transitioned to comfort care with DNR and DNI CODE STATUS.  IV Lasix  was stopped and O2 provided as needed.  On January 23, patient was a bit more alert and started to open her eyes and track, she can answer her name and a couple simple questions.  NG tube was placed.  Creatinine was up to 3.7, Lasix  continued to be on hold.  Entresto  and Aldactone were put on hold.  Urine output was poor.  Mild hyperkalemia.  [facility] was called to see if they will  accept the patient given her progressive renal failure.  Patient was accepted.  We are asked to see for renal failure. Pt seen in hospital room.  Her eyes are open she responds verbally.  She is a bit confused.  She got 2026 with some help.  Did not know where she was.  Knew that the president was Trump.  Denied any shortness of breath, chest pain, abdominal pain.  Poor historian in general    Vitals:   08/20/24 0049 08/20/24 0349 08/20/24 0452 08/20/24 0809  BP: (!) 152/64   (!) 140/54  Pulse: (!) 55 (!) 55  (!) 57  Resp: 16 18  18   Temp: 97.9 F (36.6 C) 97.7 F (36.5 C)  98 F (36.7 C)  TempSrc: Oral Oral  Axillary  SpO2: 95% 94%  98%  Weight:   60 kg     Exam: Gen no distress, perseverates occasionally, pleasant otherwise Sclera anicteric, throat remains dry flat neck veins Chest clear bilat to bases, on RA RRR no MRG Abd soft ntnd no mass or ascites +bs GU - foley cath in place w/ clear light yellowish urine  Ext bilat AKA, absolutely no LE / UE edema Neuro is alert, follows simple commands     Home bp meds: Lasix  Propranolol  Entresto          Date  Creat               eGFR                           AKI peak Cr      2013- 2016                  0.49- 1.01 2023                            1.22- 1.63 Jan-feb 2025               1.70- 2.18        26- 35 ml/min              2.35 June 2025                   1.90                 31 ml/min 1/20                             2.03                 29 1/21                             2.15                 27                     1/22                             3.07                 17 1/23                             3.79                              08/19/24                        4.22                 12 ml/min (in [place]) 08/19/24                        3.96                 Here at [facility]       BP- bp's are stable here, no hypotension , 135/80 range I/O: not recorded   UA 1/25- prot > 300, 0-5 rbc/ wbc,  epis 6-10  Renal US  - pending  CXR 1/24 -  IMPRESSION: Basilar airspace opacities, right greater than left. Enteric tube tip and side port project below the diaphragm. Labs today --> bun 96, creat 7.2, K+ 42, CO2 15         Assessment/ Plan:   # AKI on CKD 3b - b/l creatinine 1.7- 2.1, from Jan- June 2025, eGFR 26- 35 ml/min - creat 2.0 on admit 1/20 in [place],  since then has climbed to 4.2 on 1/24 - pt was diuresed on 1/20 and 1/21 for suspected CHF - no hypotension, no contrast - bp's here are stable around 135/80  - UA shows prot > 300, no wbc/ rbc's - renal US  pending  - lasix  held 1/22 (prior to transfer) due to rising creatinine - initial exam showed pt euvolemic to dry, on RA, flat neck veins, dry mouth - AKI suspect due to diuresis +/- entresto  - IV bicarb gtt 85 cc/hr was started 1/24 - creat down yest at 3.9 and today at 3.7, good UOP - euvolemic to dry still -> will continue bicarb gtt, ^ to 100 /hr - f/u labs in am   # acute on chronic HFrEF/ hx EF 35-40% - presented w/ high BNP, felt to have CHF - thoracentesis deferred on presentation as was not hypoxic - they held lasix , aldactone and entresto  on 1/22 due to rising creatinine - CXR 1/24 here was clear   # metabolic acidosis - from AKI/ CKD - cont bicarb gtt, ^ to 100 cc/hr    # suspected acute CVA, R caudate - was unresponsive and made comfort care, then pt responded and comfort care was cancelled - pt remains DNR-Limited  # PAD/ hx bilat AKA     Rob Geralynn MD  [facility] 08/20/2024, 11:20 AM  Recent Labs  Lab 08/19/24 1839 08/19/24 1848 08/20/24 0312  HGB 11.1*  --  11.2*  ALBUMIN 3.1*  --   --   CALCIUM  8.0*  --  7.8*  PHOS  --  6.1*  --   CREATININE 3.96*  --  3.72*  K 4.5  --  4.2   No results for input(s): IRON, TIBC, FERRITIN in the last 168 hours. Inpatient medications:  aspirin   81 mg Per Tube Daily   clopidogrel   75 mg Per Tube Daily   heparin   5,000 Units Subcutaneous Q8H    insulin  aspart  0-15 Units Subcutaneous TID WC   insulin  aspart  0-5 Units Subcutaneous QHS   insulin  glargine  15 Units Subcutaneous Daily   rosuvastatin   40 mg Per Tube QPM    feeding supplement (GLUCERNA 1.2 CAL)     sodium bicarbonate  150 mEq in sterile water  1,150 mL infusion 85 mL/hr at 08/20/24 0351   acetaminophen  **OR** acetaminophen , albuterol , ALPRAZolam , hydrALAZINE , ondansetron  **OR** ondansetron  (ZOFRAN ) IV

## 2024-08-20 NOTE — Evaluation (Signed)
 Occupational Therapy Evaluation Patient Details Name: [name] MRN: [id number] DOB: [birth year] Today's Date: 08/20/2024   History of Present Illness   54 y.o. female presents to [facility] 08/19/24 from [facility] for evaluation of progressive renal failure. Pt also with AMS w/ L sided weakness and acute on chronic CHF exacerbation. MRI brain showed multiple acute infarcts in bilateral frontal, parietal and occipital lobes, right basal ganglia, left thalamus and left cerebellum, possibly embolic. PMHx: HFrEF (30-35%), PVCs, tobacco abuse, HTN, PAD, bilateral AKA, CKD III, HLD, PVD     Clinical Impressions Pt admitted based on above, and was seen based on problem list below. Per sister via phone call, PTA pt was mod I with slideboard transfers to power w/c. She reports pt mod I with ADLs from power w/c, with exception of toileting completed at bed level. Today pt is requiring min  to total assist for ADLs. Bed mobility was total +2 assist. Noted pt with L sided head turn, and R sided gaze preference. Pt appears to have L sided inattention, and difficulty crossing midline with RUE. Generalized weakness with LUE worse than RUE, however pt able to use functionally for grooming tasks. Formal assessments limited d/t pt's cog deficits. Pt presents with task perseveration, decreased attention, sequencing, orientation, and memory. Based on pt's medical complexity and was mod I PTA, anticipate pt would progress well with intensive rehab. Recommending >3 hours of skilled rehab daily. OT will continue to follow acutely to maximize functional independence.     If plan is discharge home, recommend the following:   Two people to help with bathing/dressing/bathroom;Two people to help with walking and/or transfers;Assistance with cooking/housework;Assist for transportation     Functional Status Assessment   Patient has had a recent decline in their functional status and demonstrates the ability to make  significant improvements in function in a reasonable and predictable amount of time.     Equipment Recommendations   Other (comment) (Defer to next venue)     Recommendations for Other Services   Rehab consult     Precautions/Restrictions   Precautions Precautions: Fall Recall of Precautions/Restrictions: Impaired Restrictions Weight Bearing Restrictions Per Provider Order: No     Mobility Bed Mobility Overal bed mobility: Needs Assistance Bed Mobility: Supine to Sit, Sit to Supine     Supine to sit: Total assist, +2 for physical assistance, +2 for safety/equipment Sit to supine: Total assist, +2 for physical assistance, +2 for safety/equipment   General bed mobility comments: Total +2 using helicopter method d/t pt's poor command following    Transfers     General transfer comment: Deferred for safety      Balance Overall balance assessment: Needs assistance Sitting-balance support: Bilateral upper extremity supported, Feet unsupported Sitting balance-Leahy Scale: Zero Sitting balance - Comments: Total assist d/t posterior lean Postural control: Posterior lean     ADL either performed or assessed with clinical judgement   ADL Overall ADL's : Needs assistance/impaired Eating/Feeding: Bed level;Minimal assistance   Grooming: Wash/dry face;Bed level;Minimal assistance Grooming Details (indicate cue type and reason): Perseverating on task, required cues to cross midline with R hand Upper Body Bathing: Moderate assistance;Bed level   Lower Body Bathing: Maximal assistance;Bed level   Upper Body Dressing : Moderate assistance;Bed level   Lower Body Dressing: Maximal assistance;Bed level         General ADL Comments: Pt limited d/t cog and poor sitting balance     Vision Baseline Vision/History: 0 No visual deficits Vision Assessment?: Vision impaired-  to be further tested in functional context Additional Comments: Unable to formally assess d/t cog  and poor command following. Pt with R sided gaze preference, with L head turn     Perception Perception: Impaired (Difficult to assess d/t cog) Preception Impairment Details: Inattention/Neglect (Appears slight L sided inattiention)     Praxis Praxis:  (Appears poor motor planning, but difficult to assess d/t cog)       Pertinent Vitals/Pain Pain Assessment Pain Assessment: Faces Faces Pain Scale: Hurts a little bit Pain Location: generalized with movement. Pt unable to verbalize location Pain Descriptors / Indicators: Discomfort, Grimacing Pain Intervention(s): Limited activity within patient's tolerance     Extremity/Trunk Assessment Upper Extremity Assessment Upper Extremity Assessment: Generalized weakness;Left hand dominant (Decreased grip strength, shoulders appear weak, unable to test sensation d/t cog. Used L hand functionally for grooming.)   Lower Extremity Assessment Lower Extremity Assessment: Defer to PT evaluation   Cervical / Trunk Assessment Cervical / Trunk Assessment: Other exceptions Cervical / Trunk Exceptions: Poor trunk and neck control sitting EOB   Communication Communication Communication: Impaired Factors Affecting Communication: Difficulty expressing self (Pt with mostly yes/no answers. Perseverates on words)   Cognition Arousal: Lethargic Behavior During Therapy: Restless Cognition: Cognition impaired, Difficult to assess Difficult to assess due to: Impaired communication Orientation impairments: Place, Time, Situation Awareness: Intellectual awareness impaired, Online awareness impaired Memory impairment (select all impairments): Short-term memory, Working memory Attention impairment (select first level of impairment): Focused attention Executive functioning impairment (select all impairments): Organization, Reasoning, Problem solving, Initiation, Sequencing OT - Cognition Comments: Pt with delayed processing, can perseverate on words and  actions. Pt only able to state her name and birthdate. Unable to state place or situation. Pt restless with R hand, requiring soft mittens overnight                 Following commands: Impaired Following commands impaired: Follows one step commands inconsistently, Follows one step commands with increased time (Followed approxametly 40% of commands)     Cueing  General Comments   Cueing Techniques: Verbal cues;Gestural cues;Tactile cues  VSS on RA           Home Living Family/patient expects to be discharged to:: Private residence Living Arrangements: Alone Available Help at Discharge: Family;Available PRN/intermittently;Personal care attendant (HH aid (her son) 7-8 hours, 9 hours total for weekend) Type of Home: House Home Access: Ramped entrance     Home Layout: One level     Bathroom Shower/Tub: Tub/shower unit (slideboards onto dow chemical)   Bathroom Toilet: Standard Bathroom Accessibility: No   Home Equipment: Shower seat;Wheelchair - power;Other (comment);Hospital bed;BSC/3in1 (slideboard)   Additional Comments: Home set achieved from sister via phone call      Prior Functioning/Environment Prior Level of Function : Needs assist             Mobility Comments: Per sister via phone call. Pt was able to slideboard to power w/c ADLs Comments: Per sister via phone call, pt used bed pan independently and family would empty it. Able to slideboard transfer to w/c    OT Problem List: Decreased strength;Decreased range of motion;Decreased activity tolerance;Impaired balance (sitting and/or standing);Impaired vision/perception;Decreased coordination;Decreased cognition;Decreased safety awareness;Decreased knowledge of use of DME or AE;Decreased knowledge of precautions;Impaired sensation;Cardiopulmonary status limiting activity   OT Treatment/Interventions: Self-care/ADL training;Therapeutic exercise;Energy conservation;DME and/or AE instruction;Neuromuscular  education;Therapeutic activities;Cognitive remediation/compensation;Visual/perceptual remediation/compensation;Patient/family education;Balance training      OT Goals(Current goals can be found in the care plan section)   Acute Rehab  OT Goals Patient Stated Goal: For pt to improve OT Goal Formulation: Patient unable to participate in goal setting Time For Goal Achievement: 09/03/24 Potential to Achieve Goals: Good   OT Frequency:  Min 2X/week    Co-evaluation PT/OT/SLP Co-Evaluation/Treatment: Yes Reason for Co-Treatment: Complexity of the patient's impairments (multi-system involvement);Necessary to address cognition/behavior during functional activity;For patient/therapist safety;To address functional/ADL transfers   OT goals addressed during session: ADL's and self-care      AM-PAC OT 6 Clicks Daily Activity     Outcome Measure Help from another person eating meals?: A Little Help from another person taking care of personal grooming?: A Little Help from another person toileting, which includes using toliet, bedpan, or urinal?: Total Help from another person bathing (including washing, rinsing, drying)?: A Lot Help from another person to put on and taking off regular upper body clothing?: A Lot Help from another person to put on and taking off regular lower body clothing?: A Lot 6 Click Score: 13   End of Session Nurse Communication: Mobility status  Activity Tolerance: Patient limited by fatigue Patient left: in bed;with call bell/phone within reach;with bed alarm set  OT Visit Diagnosis: Unsteadiness on feet (R26.81);Other abnormalities of gait and mobility (R26.89);Muscle weakness (generalized) (M62.81);Cognitive communication deficit (R41.841);Other symptoms and signs involving cognitive function Symptoms and signs involving cognitive functions: Cerebral infarction                Time: 9186-9169 OT Time Calculation (min): 17 min Charges:  OT General Charges $OT  Visit: 1 Visit OT Evaluation $OT Eval High Complexity: 1 High  Adrianne BROCKS, OT  Acute Rehabilitation Services Office [phone] Secure chat preferred   Adrianne GORMAN Savers 08/20/2024, 10:34 AM

## 2024-08-20 NOTE — Progress Notes (Signed)
 VASCULAR LAB    Carotid duplex has been performed.  See CV proc for preliminary results.   Allysha Tryon, RVT 08/20/2024, 11:43 AM

## 2024-08-20 NOTE — Evaluation (Signed)
 Physical Therapy Evaluation Patient Details Name: [name] MRN: [id number] DOB: Jun 17, 1971 Today's Date: 08/20/2024  History of Present Illness  54 y.o. female presents to [facility] 08/19/24 from [facility] for evaluation of progressive renal failure. Pt also with AMS w/ L sided weakness and acute on chronic CHF exacerbation. MRI brain showed multiple acute infarcts in bilateral frontal, parietal and occipital lobes, right basal ganglia, left thalamus and left cerebellum, possibly embolic. PMHx: HFrEF (30-35%), PVCs, tobacco abuse, HTN, PAD, bilateral AKA, CKD III, HLD, PVD   Clinical Impression  Per sister via phone call, PTA pt was ModI for slideboard transfers to power w/c. In today's eval, pt was A&Ox1 with difficulty communicating. She was able to follow simple commands ~40% of the time with difficulty sequencing and problem solving mobility. Required TotalAx2 for bed mobility with TotalA to support posterior lean w/ posterior pelvic tilt. Pt presents with L sided weakness UE>LE, ? L sided inattention, impaired communication/cognition and impaired balance. After discussion with family, pt can have 24/7 assist available upon d/c home. Recommending >3hrs post acute rehab to maximize rehab potential with acute PT to follow.         If plan is discharge home, recommend the following: A lot of help with walking and/or transfers;A lot of help with bathing/dressing/bathroom;Assist for transportation;Help with stairs or ramp for entrance;Assistance with cooking/housework   Can travel by private vehicle    No    Equipment Recommendations Chief Of Staff for Other Services  Rehab consult    Functional Status Assessment Patient has had a recent decline in their functional status and demonstrates the ability to make significant improvements in function in a reasonable and predictable amount of time.     Precautions / Restrictions Precautions Precautions: Fall Recall of  Precautions/Restrictions: Impaired Precaution/Restrictions Comments: NG tube, mitts, chronic B AKAs Restrictions Weight Bearing Restrictions Per Provider Order: No      Mobility  Bed Mobility Overal bed mobility: Needs Assistance Bed Mobility: Supine to Sit, Sit to Supine    Supine to sit: Total assist, +2 for physical assistance, +2 for safety/equipment Sit to supine: Total assist, +2 for physical assistance, +2 for safety/equipment   General bed mobility comments: Total +2 using helicopter method d/t pt's poor command following    Transfers  General transfer comment: Deferred for safety    Modified Rankin (Stroke Patients Only) Modified Rankin (Stroke Patients Only) Pre-Morbid Rankin Score: Slight disability Modified Rankin: Severe disability     Balance Overall balance assessment: Needs assistance Sitting-balance support: Bilateral upper extremity supported, Feet unsupported Sitting balance-Leahy Scale: Zero Sitting balance - Comments: Total assist d/t posterior lean Postural control: Posterior lean       Pertinent Vitals/Pain Pain Assessment Pain Assessment: Faces Faces Pain Scale: Hurts a little bit Pain Location: generalized with movement. Pt unable to verbalize location Pain Descriptors / Indicators: Discomfort, Grimacing Pain Intervention(s): Limited activity within patient's tolerance, Monitored during session, Repositioned    Home Living Family/patient expects to be discharged to:: Private residence Living Arrangements: Alone Available Help at Discharge: Family;Personal care attendant;Available 24 hours/day (HH aid (her son) 7-8 hours, 9 hours total for weekend) Type of Home: House Home Access: Ramped entrance    Home Layout: One level Home Equipment: Shower seat;Wheelchair - power;Other (comment);Hospital bed;BSC/3in1 (slideboard) Additional Comments: Home set achieved from sister via phone call    Prior Function Prior Level of Function : Needs  assist    Mobility Comments: Per sister via phone call. Pt was able  to slideboard to power w/c ADLs Comments: Per sister via phone call, pt used bed pan independently and family would empty it. Able to slideboard transfer to w/c     Extremity/Trunk Assessment   Upper Extremity Assessment Upper Extremity Assessment: Defer to OT evaluation    Lower Extremity Assessment Lower Extremity Assessment: LLE deficits/detail;Generalized weakness;Difficult to assess due to impaired cognition (B AKA) LLE Deficits / Details: Able to lift against gravity. Unable to formally assess strength/sensation due to cognition    Cervical / Trunk Assessment Cervical / Trunk Assessment: Other exceptions Cervical / Trunk Exceptions: Poor trunk and neck control sitting EOB, posterior pelvic tilt  Communication   Communication Communication: Impaired Factors Affecting Communication: Difficulty expressing self (Pt with mostly yes/no answers. Perseverates on words)    Cognition Arousal: Lethargic Behavior During Therapy: Restless   PT - Cognitive impairments: No family/caregiver present to determine baseline, Difficult to assess, Orientation, Memory, Awareness, Attention, Initiation, Sequencing, Problem solving, Safety/Judgement Difficult to assess due to: Impaired communication Orientation impairments: Place, Time, Situation    PT - Cognition Comments: Would perseverate on certain words. Able to follow simple commands ~40% of the time with increased time. Performed better with functional tasks (washing face) Following commands: Impaired Following commands impaired: Follows one step commands inconsistently, Follows one step commands with increased time (Followed ~ 40% of commands)     Cueing Cueing Techniques: Verbal cues, Gestural cues, Tactile cues     General Comments General comments (skin integrity, edema, etc.): VSS on RA     PT Assessment Patient needs continued PT services  PT Problem List  Decreased strength;Decreased range of motion;Decreased activity tolerance;Decreased balance;Decreased mobility;Decreased coordination;Decreased cognition;Decreased knowledge of use of DME;Decreased safety awareness;Decreased knowledge of precautions       PT Treatment Interventions DME instruction;Functional mobility training;Therapeutic activities;Therapeutic exercise;Balance training;Neuromuscular re-education;Patient/family education;Wheelchair mobility training    PT Goals (Current goals can be found in the Care Plan section)  Acute Rehab PT Goals Patient Stated Goal: unable to state goal PT Goal Formulation: Patient unable to participate in goal setting Time For Goal Achievement: 09/03/24 Potential to Achieve Goals: Good    Frequency Min 3X/week     Co-evaluation   Reason for Co-Treatment: Complexity of the patient's impairments (multi-system involvement);Necessary to address cognition/behavior during functional activity;For patient/therapist safety;To address functional/ADL transfers PT goals addressed during session: Mobility/safety with mobility;Balance OT goals addressed during session: ADL's and self-care       AM-PAC PT 6 Clicks Mobility  Outcome Measure Help needed turning from your back to your side while in a flat bed without using bedrails?: Total Help needed moving from lying on your back to sitting on the side of a flat bed without using bedrails?: Total Help needed moving to and from a bed to a chair (including a wheelchair)?: Total Help needed standing up from a chair using your arms (e.g., wheelchair or bedside chair)?: Total Help needed to walk in hospital room?: Total Help needed climbing 3-5 steps with a railing? : Total 6 Click Score: 6    End of Session   Activity Tolerance: Other (comment) (limited 2/2 cognition) Patient left: in bed;with call bell/phone within reach Nurse Communication: Mobility status PT Visit Diagnosis: Unsteadiness on feet  (R26.81);Other abnormalities of gait and mobility (R26.89);Muscle weakness (generalized) (M62.81);Hemiplegia and hemiparesis Hemiplegia - Right/Left: Left Hemiplegia - caused by: Cerebral infarction    Time: 0812-0830 PT Time Calculation (min) (ACUTE ONLY): 18 min   Charges:   PT Evaluation $PT Eval Moderate Complexity: 1  Mod   PT General Charges $$ ACUTE PT VISIT: 1 Visit       Kate ORN, PT, DPT Secure Chat Preferred  Rehab Office [phone]   Kate BRAVO Wendolyn 08/20/2024, 12:09 PM

## 2024-08-20 NOTE — Consult Note (Addendum)
" °  CLINICAL SUPPORT TEAM - WOUND OSTOMY AND CONTINENCE TEAM  CONSULTATION SERVICES   WOC Nurse-Inpatient Note    WOC Nurse Consult Note: Reason for Consult: deep tissue pressure injury POA  Wound type: Deep tissue Pressure Injury sacrum extending onto buttocks  Pressure Injury POA: Yes Measurement: see nursing flowsheet  Wound bed: purple maroon discoloration with ? Developing necrotic tissue centrally at sacrum  Drainage (amount, consistency, odor) see nursing flowsheet  Periwound: erythema  Dressing procedure/placement/frequency: Cleanse sacrum/buttocks with soap and water , apply Xeroform gauze ([id number]) to wound bed daily and secure with silicone foam.   Patient should be placed on a low air loss mattress for pressure redistribution and moisture management.    POC discussed with bedside nurse. WOC team will not follow.  Deep Tissue Pressure Injuries are high risk to deteriorate.  Please reconsult for worsening of wound bed.   Thank you,    Nazarene Bunning MSN, RN-BC, CWOCN     "

## 2024-08-20 NOTE — Consult Note (Signed)
 " Palliative Medicine Inpatient Consult Note  Consulting Provider: Willette Adriana LABOR, MD   Reason for consult:   Palliative Care Consult Services Palliative Medicine Consult   Symptom Management Consult  Reason for Consult? Goals of care-CODE STATUS   08/20/2024  HPI:  Per intake H&P -->   [name] is a 54 year old female with a past medical history significant for heart failure, peripheral arterial disease requiring bilateral above-the-knee amputations, tobacco abuse, anxiety, arthritis, depression, GERD, hypertension, and type 2 diabetes mellitus.  [name] was admitted to [facility] from [facility] on January 24 in the setting of progressive renal failure.  Palliative care has been asked to support additional goals of care conversations.  Clinical Assessment/Goals of Care:  I have reviewed medical records including EPIC notes of Dr. Rosemarie, Dr. Jillian, Dr. Geralynn, labs inclusive of BMP and CBC from today and Abdominal Xray from this morning which shows NGT in place, received report from bedside RN Aisha, assessed the patient who is lying in bed able to only state that he R hip is hurting.    I called and spoke with [name]'s sister, [name] and son, [name] to further discuss diagnosis prognosis, GOC, EOL wishes, disposition and options.   I introduced Palliative Medicine as specialized medical care for people living with serious illness. It focuses on providing relief from the symptoms and stress of a serious illness. The goal is to improve quality of life for both the patient and the family.  Medical History Review and Understanding:  A review of [name]'s past medical history significant for congestive heart failure, peripheral arterial disease requiring above-the-knee amputations bilaterally, hypertension, anxiety, arthritis, depression, GERD, type 2 diabetes, and tobacco abuse was completed.  Social History:  [name] is from [place], [place] .  She is not married.   She has one son, [name]  She has not been able to work due to her present disability (BLE AKA's).  Her son, [name] helps provide care 5 days a week as her primary caregiver.  She enjoys spending time with her family.  Per her sister she takes great pride in her autonomy.  She is a woman of Christian faith.  Functional and Nutritional State:  Preceding hospitalization, [name] was able to get in and out of her electric wheelchair in the home.  Her son came and cooks for her but otherwise she was fairly autonomous.  She did need her clothing set up for her but was able to dress herself and feed herself.  She had had no recent deviations in her appetite.  Advance Directives:  A detailed discussion was had today regarding advanced directives.  [name] does not have advanced directives though her brother, sister, and son help make decisions for her.  Code Status:  Concepts specific to code status, artifical feeding and hydration, continued IV antibiotics and rehospitalization was had.  The difference between a aggressive medical intervention path  and a palliative comfort care path for this patient at this time was had.   Confirm that [name] is a DO NOT RESUSCITATE/DO NOT INTUBATE CODE STATUS.  Discussion:  The circumstances surrounding hospitalization at [facility] were reviewed.  We discussed her current clinical condition and how she has suffered a stroke.  We reviewed her kidney dysfunction and this being the primary reason for transfer to [facility].    [name] and I reviewed the difficulty of care and losing her legs and how with her now suffering a stroke this additional loss of autonomy will  be difficult for [name] to endure.  Patient's sister shares that she is not sure [name] has a good idea of what is going on around her normal with her health.  She notes that her sister does recognize her as well as other family members.  We discussed the potential outcomes in the setting of  caring stroke inclusive of complications with mobility, swallowing, speech, and cognition.  We reviewed what to anticipate moving forward and how patients who have already suffered an ischemic stroke are at high risk for suffering additional strokes.  We further discussed the impact this would likely have on [name]'s life and her level of dependence moving into the future.  [name] shares she has not noticed a large difference in care and from [facility] to [facility].  She states that her stroke happened a few days ago and that [name] is about the same.  [name] shares while she was at [facility] the whole family thought [name] was at end-of-life as she had a death rattle however the next day seem to rebound and was coherent and verbal.  We discussed the rally and how some patients can have a burst of energy and a level of mental clarity prior to further deterioration.  Patient's sister and I discussed allowing her a few days to see if she can improve on her own and if not talking more about comfort measures and transitioning to inpatient hospice.  Both patient's sister [name] and patient's [name] are in agreement with this.  They share how difficult this whole situation has been on them.  I offered time and support emotionally through therapeutic listening.  Patient's [name] shares that as [name] is now he would not be able to care for her.  We talked about hospice in the home versus inpatient hospice.  Will patient's sister and patient's son do not think they would be able to offer additional support in the home 24/7.  If hospice is needed ideally would be an inpatient hospice home close to [facility], [place] .  Discussed the importance of continued conversation with family and their  medical providers regarding overall plan of care and treatment options, ensuring decisions are within the context of the patients values and GOCs.  Decision Maker: [place],[name]: Sister, Emergency Contact:  [phone] (Mobile)   SUMMARY OF RECOMMENDATIONS   DNAR/DNI  Continue care for the next 48 hours to see if additional improvements can be made  If no improvements are made in the next 48 hours additional conversations related to comfort care and hospice will be held  Ongoing palliative medicine team support  Code Status/Advance Care Planning: DNAR/DNI  Palliative Prophylaxis:  Aspiration, Bowel Regimen, Delirium Protocol, Frequent Pain Assessment, Oral Care, Palliative Wound Care, and Turn Reposition  Additional Recommendations (Limitations, Scope, Preferences): Continue current care  Psycho-social/Spiritual:  Desire for further Chaplaincy support: Yes Additional Recommendations: Education on disease burden-stroke   Prognosis: Limited overall given patient's profound debility, inability to mobilize, inability to eat or drink at this time, and chronic comorbid conditions.  Discharge Planning: Discharge plan to be determined.  Vitals:   08/20/24 0809 08/20/24 1219  BP: (!) 140/54 (!) 147/61  Pulse: (!) 57 (!) 55  Resp: 18   Temp: 98 F (36.7 C) 97.6 F (36.4 C)  SpO2: 98% 98%    Intake/Output Summary (Last 24 hours) at 08/20/2024 1220 Last data filed at 08/20/2024 0456 Gross per 24 hour  Intake 259.18 ml  Output 500 ml  Net -240.82 ml  Last Weight  Most recent update: 08/20/2024  4:53 AM    Weight  60 kg (132 lb 4.4 oz)             LABS: CBC:    Component Value Date/Time   WBC 10.7 (H) 08/20/2024 0312   HGB 11.2 (L) 08/20/2024 0312   HGB 13.4 01/17/2024 1024   HCT 34.7 (L) 08/20/2024 0312   HCT 42.3 01/17/2024 1024   PLT 138 (L) 08/20/2024 0312   PLT 253 01/17/2024 1024   MCV 93.5 08/20/2024 0312   MCV 97 01/17/2024 1024   NEUTROABS 8.8 (H) 08/19/2024 1839   LYMPHSABS 1.6 08/19/2024 1839   MONOABS 1.1 (H) 08/19/2024 1839   EOSABS 0.0 08/19/2024 1839   BASOSABS 0.0 08/19/2024 1839   Comprehensive Metabolic Panel:    Component Value Date/Time    NA 142 08/20/2024 0312   NA 132 (L) 01/17/2024 1025   K 4.2 08/20/2024 0312   CL 110 08/20/2024 0312   CO2 15 (L) 08/20/2024 0312   BUN 96 (H) 08/20/2024 0312   BUN 33 (H) 01/17/2024 1025   CREATININE 3.72 (H) 08/20/2024 0312   CREATININE 1.09 02/21/2014 1729   GLUCOSE 192 (H) 08/20/2024 0312   CALCIUM  7.8 (L) 08/20/2024 0312   AST 31 08/19/2024 1839   ALT 38 08/19/2024 1839   ALKPHOS 174 (H) 08/19/2024 1839   BILITOT 0.5 08/19/2024 1839   PROT 5.5 (L) 08/19/2024 1839   ALBUMIN 3.1 (L) 08/19/2024 1839   Gen: Middle-age Caucasian female chronically ill in appearance HEENT: Nasogastric tube in place, dry mucous membranes CV: Regular rate and rhythm. PULM: On room air breathing is even and nonlabored ABD: soft/nontender  EXT: Bilateral AKA's Neuro: Opens eyes perseverates on right hip discomfort  PPS: 10%   This conversation/these recommendations were discussed with patient primary care team, Dr. Jillian ______________________________________________________ [name] Palliative Medicine Team Team Cell Phone: [phone] Please utilize secure chat with additional questions, if there is no response within 30 minutes please call the above phone number  I personally spent a total of 75 minutes in the care of the patient today including preparing to see the patient, getting/reviewing separately obtained history, performing a medically appropriate exam/evaluation, counseling and educating, placing orders, referring and communicating with other health care professionals, documenting clinical information in the EHR, independently interpreting results, communicating results, and coordinating care.  "

## 2024-08-20 NOTE — Progress Notes (Signed)
 VASCULAR LAB    TCD has been performed.  See CV proc for preliminary results.   Liya Strollo, RVT 08/20/2024, 12:36 PM

## 2024-08-20 NOTE — Progress Notes (Addendum)
 STROKE TEAM PROGRESS NOTE    INTERIM HISTORY/SUBJECTIVE No family at the bedside.  Patient laying in the bed in no apparent distress. Patient does have bilateral mitts on.  She is oriented to herself, delayed responses.  She does move her bilateral upper extremities  MRI brain was limited due to motion however does show many acute bilateral infarcts in frontal, parietal and occipital lobes as well as the right basal ganglia, left thalamus and left cerebellum   CBC    Component Value Date/Time   WBC 10.7 (H) 08/20/2024 0312   RBC 3.71 (L) 08/20/2024 0312   HGB 11.2 (L) 08/20/2024 0312   HGB 13.4 01/17/2024 1024   HCT 34.7 (L) 08/20/2024 0312   HCT 42.3 01/17/2024 1024   PLT 138 (L) 08/20/2024 0312   PLT 253 01/17/2024 1024   MCV 93.5 08/20/2024 0312   MCV 97 01/17/2024 1024   MCH 30.2 08/20/2024 0312   MCHC 32.3 08/20/2024 0312   RDW 16.0 (H) 08/20/2024 0312   RDW 13.2 01/17/2024 1024   LYMPHSABS 1.6 08/19/2024 1839   MONOABS 1.1 (H) 08/19/2024 1839   EOSABS 0.0 08/19/2024 1839   BASOSABS 0.0 08/19/2024 1839    BMET    Component Value Date/Time   NA 142 08/20/2024 0312   NA 132 (L) 01/17/2024 1025   K 4.2 08/20/2024 0312   CL 110 08/20/2024 0312   CO2 15 (L) 08/20/2024 0312   GLUCOSE 192 (H) 08/20/2024 0312   BUN 96 (H) 08/20/2024 0312   BUN 33 (H) 01/17/2024 1025   CREATININE 3.72 (H) 08/20/2024 0312   CREATININE 1.09 02/21/2014 1729   CALCIUM  7.8 (L) 08/20/2024 0312   EGFR 31 (L) 01/17/2024 1025   GFRNONAA 14 (L) 08/20/2024 0312    IMAGING past 24 hours DG Abd 1 View Result Date: 08/20/2024 EXAM: 1 VIEW XRAY OF THE ABDOMEN 08/20/2024 12:44:43 AM COMPARISON: None available. CLINICAL HISTORY: Nasogastric tube present. FINDINGS: LINES, TUBES AND DEVICES: Enteric tube in place with distal tip within the stomach, the proximal side port lies at the gastroesophageal junction. This should be advanced deeper into the stomach. BOWEL: Nonobstructive bowel gas pattern.  Scattered large and small bowel gas is noted. No free air is seen. SOFT TISSUES: Cholecystectomy clips noted. BONES: No acute fracture. IMPRESSION: 1. Enteric tube in place with distal tip within the stomach and proximal side port at the gastroesophageal junction; this should be advanced deeper into the stomach. 2. No free air. Electronically signed by: Oneil Devonshire MD 08/20/2024 12:47 AM EST RP Workstation: [id number]   MR BRAIN WO CONTRAST Result Date: 08/19/2024 EXAM: MRI Brain Without Contrast 08/19/2024 10:13:21 PM TECHNIQUE: Multiplanar multisequence MRI of the head/brain was performed without the administration of intravenous contrast. COMPARISON: None available. CLINICAL HISTORY: Neuro deficit, acute, stroke suspected Acute neurological deficit, stroke suspected. FINDINGS: Motion limited and incomplete study.  No SWI and no T2.  Within this limitation: BRAIN AND VENTRICLES: Significantly motion limited study with many acute infarcts in bilateral frontal, parietal and occipital lobes, right basal ganglia, left thalamus and left cerebellum. No obvious intracranial hemorrhage. No mass. No midline shift. No hydrocephalus. The sella is unremarkable. Normal flow voids. ORBITS: No significant abnormality. SINUSES AND MASTOIDS: No significant abnormality. BONES AND SOFT TISSUES: Normal marrow signal. No soft tissue abnormality. IMPRESSION: 1. Significantly motion limited and incomplete study with many acute infarcts in bilateral frontal, parietal and occipital lobes, right basal ganglia, left thalamus and left cerebellum. Consider embolic etiology. Electronically signed by: Glendia Molt  MD 08/19/2024 10:29 PM EST RP Workstation: [id number]   DG CHEST PORT 1 VIEW Result Date: 08/19/2024 EXAM: 1 VIEW(S) XRAY OF THE CHEST 08/19/2024 06:54:00 PM COMPARISON: 08/22/2023 CLINICAL HISTORY: Cough. FINDINGS: LINES, TUBES AND DEVICES: Enteric tube in place with tip and side port below the hemidiaphragm. LUNGS AND PLEURA:  Basilar airspace opacities are noted, right greater than left. No pleural effusion. No pneumothorax. HEART AND MEDIASTINUM: Cardiomegaly. Aortic calcification. BONES AND SOFT TISSUES: No acute osseous abnormality. IMPRESSION: 1. Basilar airspace opacities, right greater than left. 2. Enteric tube tip and side port project below the diaphragm. Electronically signed by: Oneil Devonshire MD 08/19/2024 07:03 PM EST RP Workstation: [id number]    Vitals:   08/20/24 9950 08/20/24 0349 08/20/24 0452 08/20/24 0809  BP: (!) 152/64   (!) 140/54  Pulse: (!) 55 (!) 55  (!) 57  Resp: 16 18  18   Temp: 97.9 F (36.6 C) 97.7 F (36.5 C)  98 F (36.7 C)  TempSrc: Oral Oral  Axillary  SpO2: 95% 94%  98%  Weight:   60 kg      PHYSICAL EXAM General: Chronically ill female Psych:  Mood and affect appropriate for situation CV: Regular rate and rhythm on monitor Respiratory:  Regular, unlabored respirations on room air GI: Abdomen soft and nontender Musculoskeletal bilateral below-knee amputation   NEURO:  Mental Status: She is awake alert oriented to self with slowed responses.  Follows commands  Cranial Nerves:  II: PERRL. Visual fields full.  Tracking III, IV, VI: EOMI. Eyelids elevate symmetrically.  V: Sensation is intact to light touch and symmetrical to face.  VII: Face is symmetrical resting and smiling VIII: hearing intact to voice. IX, X: Palate elevates symmetrically. Phonation is normal.  KP:Dynloizm shrug 5/5. XII: tongue is midline without fasciculations. Motor: Generalized weakness, bilateral upper extremities able to hold against gravity with drift but equal, bilateral AKA but can move stumps, questionable whether left stump moves less than right Tone: is normal and bulk is normal Sensation- Intact to light touch bilaterally. Extinction absent to light touch to DSS.   Coordination: FTN intact bilaterally, HKS: no ataxia in BLE.No drift.  Gait- deferred    ASSESSMENT/PLAN  [name] is a 54 y.o. female with history of  hypertension, diabetes, heart failure with EF 35 to 40%, PAD status post AKA, history of PE, anemia who was transferred from [facility] due to renal failure, confusion, and concern for stroke.  She became less responsive on 1/21 and was not moving the left arm and therefore a code stroke was activated and a CT head showed right caudate infarct.  She was briefly made comfort care, but had an improving mental status and therefore has been transferred for further care.  NIH on Admission 6  Acute Ischemic Infarct: Scattered bilateral infarcts in frontal, parietal and occipital lobes as well as the right basal ganglia, left thalamus and left cerebellum Etiology: Likely cardioembolic MRI  Significantly motion limited and incomplete study with many acute infarcts in bilateral frontal, parietal and occipital lobes, right basal ganglia, left thalamus and left cerebellum. Consider embolic etiology. Carotid Doppler ordered TCD with bubble ordered 2D Echo EF 50 to 55%.  LV moderately to severely dilated Will need either a 30-day heart monitor or loop recorder depending on patient improvement LDL 119 HgbA1c 7.6 VTE prophylaxis -heparin  subcu aspirin  81 mg daily and clopidogrel  75 mg daily prior to admission, now on aspirin  81 mg daily and clopidogrel  75 mg daily Therapy  recommendations:  Pending Disposition: Pending  Hypertension CHF CAD Carotid artery stenosis Home meds: Lasix  40 mg, propranolol  120 mg Stable Blood Pressure Goal: SBP less than 160   Hyperlipidemia Home meds: Crestor  40 mg, resumed in hospital LDL 119, goal < 70 Consider switching Crestor  to atorvastatin  80 mg Continue statin at discharge  Diabetes type II UnControlled Home meds: Jardiance , insulin  HgbA1c 7.6, goal < 7.0 CBGs SSI Recommend close follow-up with PCP for better DM control  Dysphagia Patient has post-stroke dysphagia, SLP consulted    Diet   Diet NPO  time specified   Advance diet as tolerated  Other Stroke Risk Factors Obesity, Body mass index is 33.11 kg/m., BMI >/= 30 associated with increased stroke risk, recommend weight loss, diet and exercise as appropriate  Coronary artery disease Congestive heart failure   Other Active Problems CKD stage IIIb PAD S/P bilateral AKA Anxiety and depression COPD Vitamin D deficiency Vitamin B12 deficiency Fatty liver  Hospital day # 1  Karna Geralds DNP, ACNPC-AG  [facility]  I have personally obtained history,examined this patient, reviewed notes, independently viewed imaging studies, participated in medical decision making and plan of care.ROS completed by me personally and pertinent positives fully documented  I have made any additions or clarifications directly to the above note. Agree with note above.  Patient presents with confusion and MRI scan was bilateral small embolic infarcts maintain posterior circulation.  Recommend continue ongoing stroke workup check carotid ultrasound, transcranial Doppler.Continue cardiac monitoring and may will likely need outpatient monitoring to look for paroxysmal A-fib..   Continue aspirin  and Plavix  for now.  Discussed with family goals of care.  Discussed with Dr. Ivonne.   I personally spent a total of 50 minutes in the care of the patient today including getting/reviewing separately obtained history, performing a medically appropriate exam/evaluation, counseling and educating, placing orders, referring and communicating with other health care professionals, documenting clinical information in the EHR, independently interpreting results, and coordinating care.        Eather Popp, MD Medical Director [facility] Pager: [phone] 08/20/2024 12:55 PM   To contact Stroke Continuity provider, please refer to [email or web address]. After hours, contact General Neurology

## 2024-08-20 NOTE — Progress Notes (Signed)
 [facility] night cross cover note:   I was notified by the patient's RN that the patient's NG tube has now been replaced after patient had self removed the preceding NGT in the context of the patient's failed RN swallow screen, with MRI brain today showing evidence of multiple acute infarcts in distribution concerning for embolic process.   I subsequently placed order for plain film to confirm placement/positioning of NGT and also placed updated order for nasogastric tube to reconcile the recently placed NGT.      [name] Pore, DO Hospitalist

## 2024-08-21 DIAGNOSIS — I7090 Unspecified atherosclerosis: Secondary | ICD-10-CM

## 2024-08-21 DIAGNOSIS — Z7984 Long term (current) use of oral hypoglycemic drugs: Secondary | ICD-10-CM

## 2024-08-21 DIAGNOSIS — I6340 Cerebral infarction due to embolism of unspecified cerebral artery: Secondary | ICD-10-CM | POA: Diagnosis not present

## 2024-08-21 DIAGNOSIS — I69391 Dysphagia following cerebral infarction: Secondary | ICD-10-CM | POA: Diagnosis not present

## 2024-08-21 DIAGNOSIS — E1151 Type 2 diabetes mellitus with diabetic peripheral angiopathy without gangrene: Secondary | ICD-10-CM | POA: Diagnosis not present

## 2024-08-21 DIAGNOSIS — Z794 Long term (current) use of insulin: Secondary | ICD-10-CM | POA: Diagnosis not present

## 2024-08-21 DIAGNOSIS — I130 Hypertensive heart and chronic kidney disease with heart failure and stage 1 through stage 4 chronic kidney disease, or unspecified chronic kidney disease: Secondary | ICD-10-CM | POA: Diagnosis not present

## 2024-08-21 DIAGNOSIS — E785 Hyperlipidemia, unspecified: Secondary | ICD-10-CM | POA: Diagnosis not present

## 2024-08-21 DIAGNOSIS — J9601 Acute respiratory failure with hypoxia: Secondary | ICD-10-CM | POA: Diagnosis not present

## 2024-08-21 DIAGNOSIS — I509 Heart failure, unspecified: Secondary | ICD-10-CM | POA: Diagnosis not present

## 2024-08-21 DIAGNOSIS — N1832 Chronic kidney disease, stage 3b: Secondary | ICD-10-CM | POA: Diagnosis not present

## 2024-08-21 DIAGNOSIS — E669 Obesity, unspecified: Secondary | ICD-10-CM | POA: Diagnosis not present

## 2024-08-21 DIAGNOSIS — E1122 Type 2 diabetes mellitus with diabetic chronic kidney disease: Secondary | ICD-10-CM | POA: Diagnosis not present

## 2024-08-21 LAB — COMPREHENSIVE METABOLIC PANEL WITH GFR
ALT: 28 U/L (ref 0–44)
AST: 30 U/L (ref 15–41)
Albumin: 2.8 g/dL — ABNORMAL LOW (ref 3.5–5.0)
Alkaline Phosphatase: 138 U/L — ABNORMAL HIGH (ref 38–126)
Anion gap: 15 (ref 5–15)
BUN: 83 mg/dL — ABNORMAL HIGH (ref 6–20)
CO2: 25 mmol/L (ref 22–32)
Calcium: 7.5 mg/dL — ABNORMAL LOW (ref 8.9–10.3)
Chloride: 105 mmol/L (ref 98–111)
Creatinine, Ser: 2.81 mg/dL — ABNORMAL HIGH (ref 0.44–1.00)
GFR, Estimated: 19 mL/min — ABNORMAL LOW
Glucose, Bld: 194 mg/dL — ABNORMAL HIGH (ref 70–99)
Potassium: 3.7 mmol/L (ref 3.5–5.1)
Sodium: 145 mmol/L (ref 135–145)
Total Bilirubin: 0.4 mg/dL (ref 0.0–1.2)
Total Protein: 5 g/dL — ABNORMAL LOW (ref 6.5–8.1)

## 2024-08-21 LAB — CBC
HCT: 33.9 % — ABNORMAL LOW (ref 36.0–46.0)
Hemoglobin: 10.8 g/dL — ABNORMAL LOW (ref 12.0–15.0)
MCH: 29.9 pg (ref 26.0–34.0)
MCHC: 31.9 g/dL (ref 30.0–36.0)
MCV: 93.9 fL (ref 80.0–100.0)
Platelets: 137 10*3/uL — ABNORMAL LOW (ref 150–400)
RBC: 3.61 MIL/uL — ABNORMAL LOW (ref 3.87–5.11)
RDW: 15.9 % — ABNORMAL HIGH (ref 11.5–15.5)
WBC: 8.9 10*3/uL (ref 4.0–10.5)
nRBC: 0 % (ref 0.0–0.2)

## 2024-08-21 LAB — GLUCOSE, CAPILLARY
Glucose-Capillary: 168 mg/dL — ABNORMAL HIGH (ref 70–99)
Glucose-Capillary: 175 mg/dL — ABNORMAL HIGH (ref 70–99)
Glucose-Capillary: 186 mg/dL — ABNORMAL HIGH (ref 70–99)
Glucose-Capillary: 202 mg/dL — ABNORMAL HIGH (ref 70–99)
Glucose-Capillary: 67 mg/dL — ABNORMAL LOW (ref 70–99)
Glucose-Capillary: 79 mg/dL (ref 70–99)

## 2024-08-21 MED ORDER — CLOPIDOGREL BISULFATE 75 MG PO TABS: 75.0000 mg | ORAL_TABLET | Freq: Every day | ORAL | Status: DC

## 2024-08-21 MED ORDER — ADULT MULTIVITAMIN W/MINERALS CH
1.0000 | ORAL_TABLET | Freq: Every day | ORAL | Status: AC
  Administered 2024-08-21 – 2024-09-01 (×12): 1 via ORAL
  Filled 2024-08-21 (×12): qty 1

## 2024-08-21 MED ORDER — ENSURE PLUS HIGH PROTEIN PO LIQD
237.0000 mL | Freq: Three times a day (TID) | ORAL | Status: DC
  Administered 2024-08-21 – 2024-08-25 (×10): 237 mL via ORAL

## 2024-08-21 MED ORDER — ASPIRIN 81 MG PO CHEW: 81.0000 mg | CHEWABLE_TABLET | Freq: Every day | ORAL | Status: DC

## 2024-08-21 MED ORDER — CLOPIDOGREL BISULFATE 75 MG PO TABS
75.0000 mg | ORAL_TABLET | Freq: Every day | ORAL | Status: AC
  Administered 2024-08-21 – 2024-09-01 (×12): 75 mg via ORAL
  Filled 2024-08-21 (×11): qty 1

## 2024-08-21 MED ORDER — ROSUVASTATIN CALCIUM 20 MG PO TABS
40.0000 mg | ORAL_TABLET | Freq: Every evening | ORAL | Status: AC
  Administered 2024-08-22 – 2024-09-01 (×10): 40 mg via ORAL
  Filled 2024-08-21 (×11): qty 2

## 2024-08-21 MED ORDER — ASPIRIN 81 MG PO CHEW
81.0000 mg | CHEWABLE_TABLET | Freq: Every day | ORAL | Status: DC
  Administered 2024-08-21 – 2024-08-30 (×10): 81 mg via ORAL
  Filled 2024-08-21 (×9): qty 1

## 2024-08-21 NOTE — Progress Notes (Signed)
 Pt is much more alert this morning, has pulled out NG tube(has Mitts on). Pt will not let me replace NG Tube. Pt is repeatedly asking for apple juice and to have Mitts removed. She is to have Cortrack feeding tube placed today. Provider on call notified of her unwillingness to let me replace NG tube at this time. Will leave out for now in anticipation of Small bore tube placement this morning.

## 2024-08-21 NOTE — Progress Notes (Addendum)
 STROKE TEAM PROGRESS NOTE    INTERIM HISTORY/SUBJECTIVE No family at the bedside.  Patient laying in the bed in no apparent distress. Patient does have bilateral mitts on.  She remains confused but is oriented to herself, delayed responses.  She does move her bilateral upper extremities  Carotid ultrasound shows 40 to 59% left ICA stenosis and 1-39% right ICA stenosis.  Transcranial Doppler study shows absent left temporal windows.  Elevated pulsatility in dicey suggest diffuse intracranial atherosclerosis. CBC    Component Value Date/Time   WBC 8.9 08/21/2024 0257   RBC 3.61 (L) 08/21/2024 0257   HGB 10.8 (L) 08/21/2024 0257   HGB 13.4 01/17/2024 1024   HCT 33.9 (L) 08/21/2024 0257   HCT 42.3 01/17/2024 1024   PLT 137 (L) 08/21/2024 0257   PLT 253 01/17/2024 1024   MCV 93.9 08/21/2024 0257   MCV 97 01/17/2024 1024   MCH 29.9 08/21/2024 0257   MCHC 31.9 08/21/2024 0257   RDW 15.9 (H) 08/21/2024 0257   RDW 13.2 01/17/2024 1024   LYMPHSABS 1.6 08/19/2024 1839   MONOABS 1.1 (H) 08/19/2024 1839   EOSABS 0.0 08/19/2024 1839   BASOSABS 0.0 08/19/2024 1839    BMET    Component Value Date/Time   NA 145 08/21/2024 0257   NA 132 (L) 01/17/2024 1025   K 3.7 08/21/2024 0257   CL 105 08/21/2024 0257   CO2 25 08/21/2024 0257   GLUCOSE 194 (H) 08/21/2024 0257   BUN 83 (H) 08/21/2024 0257   BUN 33 (H) 01/17/2024 1025   CREATININE 2.81 (H) 08/21/2024 0257   CREATININE 1.09 02/21/2014 1729   CALCIUM  7.5 (L) 08/21/2024 0257   EGFR 31 (L) 01/17/2024 1025   GFRNONAA 19 (L) 08/21/2024 0257    IMAGING past 24 hours VAS US  TRANSCRANIAL DOPPLER Result Date: 08/21/2024  Transcranial Doppler Patient Name:  [name]  Date of Exam:   08/20/2024 Medical Rec #: [id number]        Accession #:    [id number] Date of Birth: Mar 30, 1971        Patient Gender: F Patient Age:   54 years Exam Location:  [facility] Procedure:      VAS US  TRANSCRANIAL DOPPLER Referring Phys: Elita Dame  --------------------------------------------------------------------------------  Indications: Stroke. Limitations: Patient movement and inability to turn head to the right for              visualization of the left transtemporal window Limitations for diagnostic windows: Unable to insonate left transtemporal window. Comparison Study: No prior study Performing Technologist: Alberta Lis RVS  Examination Guidelines: A complete evaluation includes B-mode imaging, spectral Doppler, color Doppler, and power Doppler as needed of all accessible portions of each vessel. Bilateral testing is considered an integral part of a complete examination. Limited examinations for reoccurring indications may be performed as noted.  +----------+---------------+----------+-----------+------------------+ RIGHT TCD Right VM (cm/s)Depth (cm)Pulsatility     Comment       +----------+---------------+----------+-----------+------------------+ MCA             87                    1.35                       +----------+---------------+----------+-----------+------------------+ ACA             -75                   1.45                       +----------+---------------+----------+-----------+------------------+  Term ICA                                      Unable to insonate +----------+---------------+----------+-----------+------------------+ PCA P1          -27                   1.73                       +----------+---------------+----------+-----------+------------------+ Opthalmic       16                    1.29                       +----------+---------------+----------+-----------+------------------+ ICA siphon                                    Unable to insonate +----------+---------------+----------+-----------+------------------+ Vertebral       -37                   1.28                       +----------+---------------+----------+-----------+------------------+   +----------+--------------+----------+-----------+------------------+ LEFT TCD  Left VM (cm/s)Depth (cm)Pulsatility     Comment       +----------+--------------+----------+-----------+------------------+ MCA                                          Unable to insonate +----------+--------------+----------+-----------+------------------+ ACA                                          Unable to insonate +----------+--------------+----------+-----------+------------------+ Term ICA                                     Unable to insonate +----------+--------------+----------+-----------+------------------+ PCA P1                                       Unable to insonate +----------+--------------+----------+-----------+------------------+ Opthalmic       15                   1.23                       +----------+--------------+----------+-----------+------------------+ ICA siphon                                   Unable to insonate +----------+--------------+----------+-----------+------------------+ Vertebral      -30                   1.69                       +----------+--------------+----------+-----------+------------------+  +------------+-------+-------+             VM cm/sComment +------------+-------+-------+ Prox Basilar  -56          +------------+-------+-------+  Summary:  Absent left temporal window limits evaluation of anterior circulation on the left. Mildly elevated right middle and anterior cerbral artery mean flow velocitie sof unclear significance. Globally elevated pulsatility indices suggests diffus eintracranial atherosclerosis likely. *See table(s) above for TCD measurements and observations.  Diagnosing physician: Eather Popp MD Electronically signed by Eather Popp MD on 08/21/2024 at 7:43:31 AM.    Final    VAS US  CAROTID Result Date: 08/21/2024 Carotid Arterial Duplex Study Patient Name:  [name]  Date of Exam:   08/20/2024  Medical Rec #: [id number]        Accession #:    [id number] Date of Birth: 1971-01-21        Patient Gender: F Patient Age:   54 years Exam Location:  [facility] Procedure:      VAS US  CAROTID Referring Phys: Silvie Obremski --------------------------------------------------------------------------------  Indications:       CVA. Risk Factors:      Hypertension, hyperlipidemia, current smoker, prior MI,                    coronary artery disease, PAD. Other Factors:     Bilateral AKA, HFrEF, CKD. Limitations        Today's exam was limited due to the patient's inability or                    unwillingness to cooperate/altered mental status. Patient                    would not keep head turned Comparison Study:  Prior carotid duplex done 09/22/23 indicating 1-39% right ICA                    stenosis and 40-59% left ICA stenosis Performing Technologist: Alberta Lis RVS  Examination Guidelines: A complete evaluation includes B-mode imaging, spectral Doppler, color Doppler, and power Doppler as needed of all accessible portions of each vessel. Bilateral testing is considered an integral part of a complete examination. Limited examinations for reoccurring indications may be performed as noted.  Right Carotid Findings: +----------+--------+--------+--------+------------------+------------------+           PSV cm/sEDV cm/sStenosisPlaque DescriptionComments           +----------+--------+--------+--------+------------------+------------------+ CCA Prox  99      19                                intimal thickening +----------+--------+--------+--------+------------------+------------------+ CCA Distal81      19                                intimal thickening +----------+--------+--------+--------+------------------+------------------+ ICA Prox  144     37      1-39%   calcific                             +----------+--------+--------+--------+------------------+------------------+ ICA  Mid   96      29                                                   +----------+--------+--------+--------+------------------+------------------+ ICA Distal126     36                                                   +----------+--------+--------+--------+------------------+------------------+  ECA       486     12      >50%    heterogenous                         +----------+--------+--------+--------+------------------+------------------+ +----------+--------+-------+--------+-------------------+           PSV cm/sEDV cmsDescribeArm Pressure (mmHG) +----------+--------+-------+--------+-------------------+ Dlarojcpjw855                                        +----------+--------+-------+--------+-------------------+ +---------+--------+--+--------+--+---------+ VertebralPSV cm/s36EDV cm/s11Antegrade +---------+--------+--+--------+--+---------+  Left Carotid Findings: +----------+--------+--------+--------+----------------------+---------+           PSV cm/sEDV cm/sStenosisPlaque Description    Comments  +----------+--------+--------+--------+----------------------+---------+ CCA Prox  174     35              heterogenous                    +----------+--------+--------+--------+----------------------+---------+ CCA Distal169     25              heterogenous                    +----------+--------+--------+--------+----------------------+---------+ ICA Prox  263     52      40-59%  irregular and calcificShadowing +----------+--------+--------+--------+----------------------+---------+ ICA Mid   118     29                                              +----------+--------+--------+--------+----------------------+---------+ ICA Distal128     33                                              +----------+--------+--------+--------+----------------------+---------+ ECA       275     25                                               +----------+--------+--------+--------+----------------------+---------+ +----------+--------+--------+--------+-------------------+           PSV cm/sEDV cm/sDescribeArm Pressure (mmHG) +----------+--------+--------+--------+-------------------+ Subclavian115                                         +----------+--------+--------+--------+-------------------+ +---------+--------+--------+------------+ VertebralPSV cm/sEDV cm/sNot assessed +---------+--------+--------+------------+   Summary: Right Carotid: Velocities in the right ICA are consistent with a 1-39% stenosis. Left Carotid: Velocities in the left ICA are consistent with a 40-59% stenosis. Vertebrals:  Right vertebral artery demonstrates antegrade flow. Left vertebral              not assessed. Subclavians: Normal flow hemodynamics were seen in bilateral subclavian              arteries. *See table(s) above for measurements and observations.  Electronically signed by Eather Popp MD on 08/21/2024 at 7:41:15 AM.    Final    US  RENAL Result Date: 08/20/2024 EXAM: RETROPERITONEAL ULTRASOUND OF THE KIDNEYS 08/20/2024 06:04:00 PM TECHNIQUE: Real-time ultrasonography of the retroperitoneum, specifically the kidneys  and urinary bladder, was performed. COMPARISON: US  Renal 10/14/2023. CLINICAL HISTORY: Acute on chronic renal failure. FINDINGS: RIGHT KIDNEY: Right kidney measures 7.2 x 2.9 x 3.2 cm. Calculated volume is 35 ml. Diffuse atrophic changes are noted. No hydronephrosis. LEFT KIDNEY: Left kidney measures 11.0 x 4.7 x 4.2 cm. Calculated volume: 114 ml. Normal cortical echogenicity. No hydronephrosis. BLADDER: The bladder is decompressed by a foley catheter. IMPRESSION: 1. Right kidney with diffuse atrophic changes, measuring 7.2 x 2.9 x 3.2 cm (calculated volume 35 ml). 2. Left kidney measuring 11.0 x 4.7 x 4.2 cm (calculated volume 114 ml). Electronically signed by: Oneil Devonshire MD 08/20/2024 06:23 PM EST RP Workstation: [id number]     Vitals:   08/20/24 1932 08/20/24 2339 08/21/24 0423 08/21/24 0735  BP: (!) 146/59 (!) 156/55 (!) 140/73 (!) 148/42  Pulse: 62 70 73 72  Resp: 16 16 16 17   Temp: 98.4 F (36.9 C) 99 F (37.2 C) 97.8 F (36.6 C) 97.7 F (36.5 C)  TempSrc: Oral Axillary Axillary Axillary  SpO2: 98% 99% 98% 95%  Weight:   62.2 kg      PHYSICAL EXAM General: Chronically ill female Psych:  Mood and affect appropriate for situation CV: Regular rate and rhythm on monitor Respiratory:  Regular, unlabored respirations on room air GI: Abdomen soft and nontender Musculoskeletal bilateral below-knee amputation   NEURO:  Mental Status: She is awake alert oriented to self with slowed responses.  Follows commands  Cranial Nerves:  II: PERRL. Visual fields full.  Tracking III, IV, VI: EOMI. Eyelids elevate symmetrically.  V: Sensation is intact to light touch and symmetrical to face.  VII: Face is symmetrical resting and smiling VIII: hearing intact to voice. IX, X: Palate elevates symmetrically. Phonation is normal.  KP:Dynloizm shrug 5/5. XII: tongue is midline without fasciculations. Motor: Generalized weakness, bilateral upper extremities able to hold against gravity with drift but equal, bilateral AKA but can move stumps, questionable whether left stump moves less than right Tone: is normal and bulk is normal Sensation- Intact to light touch bilaterally. Extinction absent to light touch to DSS.   Coordination: FTN intact bilaterally, HKS: no ataxia in BLE.No drift.  Gait- deferred    ASSESSMENT/PLAN  [name] is a 54 y.o. female with history of  hypertension, diabetes, heart failure with EF 35 to 40%, PAD status post AKA, history of PE, anemia who was transferred from [facility] due to renal failure, confusion, and concern for stroke.  She became less responsive on 1/21 and was not moving the left arm and therefore a code stroke was activated and a CT head showed right caudate  infarct.  She was briefly made comfort care, but had an improving mental status and therefore has been transferred for further care.  NIH on Admission 6  Acute Ischemic Infarct: Scattered bilateral infarcts in frontal, parietal and occipital lobes as well as the right basal ganglia, left thalamus and left cerebellum Etiology: Likely cardioembolic MRI  Significantly motion limited and incomplete study with many acute infarcts in bilateral frontal, parietal and occipital lobes, right basal ganglia, left thalamus and left cerebellum. Consider embolic etiology. Carotid Doppler 40-59% left carotid stenosis.  1-39% right carotid stenosis TCD   absent left temporal windows.  Elevated pulsatility in dicey suggest diffuse intracranial atherosclerosis. 2D Echo EF 50 to 55%.  LV moderately to severely dilated Will need either a 30-day heart monitor or loop recorder depending on patient improvement LDL 119 HgbA1c 7.6 VTE prophylaxis -heparin  subcu aspirin   81 mg daily and clopidogrel  75 mg daily prior to admission, now on aspirin  81 mg daily and clopidogrel  75 mg daily Therapy recommendations:  Pending Disposition: Pending  Hypertension CHF CAD Carotid artery stenosis Home meds: Lasix  40 mg, propranolol  120 mg Stable Blood Pressure Goal: SBP less than 160   Hyperlipidemia Home meds: Crestor  40 mg, resumed in hospital LDL 119, goal < 70 Consider switching Crestor  to atorvastatin  80 mg Continue statin at discharge  Diabetes type II UnControlled Home meds: Jardiance , insulin  HgbA1c 7.6, goal < 7.0 CBGs SSI Recommend close follow-up with PCP for better DM control  Dysphagia Patient has post-stroke dysphagia, SLP consulted    Diet   Diet NPO time specified   Advance diet as tolerated  Other Stroke Risk Factors Obesity, Body mass index is 34.32 kg/m., BMI >/= 30 associated with increased stroke risk, recommend weight loss, diet and exercise as appropriate  Coronary artery  disease Congestive heart failure   Other Active Problems CKD stage IIIb PAD S/P bilateral AKA Anxiety and depression COPD Vitamin D deficiency Vitamin B12 deficiency Fatty liver  Hospital day # 2    Patient presents with confusion and MRI scan was bilateral small embolic infarcts maintain posterior circulation.   Continue cardiac monitoring and may will likely need outpatient monitoring to look for paroxysmal A-fib..   Continue aspirin  and Plavix  for now.   Will need to discuss with family goals of care.  Discussed with Dr. Ivonne.       I personally spent a total of  35 minutes in the care of the patient today including getting/reviewing separately obtained history, performing a medically appropriate exam/evaluation, counseling and educating, placing orders, referring and communicating with other health care professionals, documenting clinical information in the EHR, independently interpreting results, and coordinating care.         Eather Popp, MD Medical Director [facility] Pager: [phone] 08/21/2024 10:49 AM   To contact Stroke Continuity provider, please refer to [email or web address]. After hours, contact General Neurology

## 2024-08-21 NOTE — Progress Notes (Addendum)
 "  Progress Note  Patient Name: [name] Date of Encounter: 08/21/2024 [facility] Cardiologist: Jayson Sierras, MD   Interval Summary   Sleepy but wakes to sounds and appears to follow commands  Vital Signs Vitals:   08/20/24 1817 08/20/24 1932 08/20/24 2339 08/21/24 0423  BP: (!) 138/54 (!) 146/59 (!) 156/55 (!) 140/73  Pulse: 60 62 70 73  Resp: 20 16 16 16   Temp: 98 F (36.7 C) 98.4 F (36.9 C) 99 F (37.2 C) 97.8 F (36.6 C)  TempSrc: Axillary Oral Axillary Axillary  SpO2: 99% 98% 99% 98%  Weight:    62.2 kg    Intake/Output Summary (Last 24 hours) at 08/21/2024 0725 Last data filed at 08/21/2024 0427 Gross per 24 hour  Intake --  Output 750 ml  Net -750 ml      08/21/2024    4:23 AM 08/20/2024    4:52 AM 08/19/2024    9:34 PM  Last 3 Weights  Weight (lbs) 137 lb 2 oz 132 lb 4.4 oz 131 lb 2.8 oz  Weight (kg) 62.2 kg 60 kg 59.5 kg      Telemetry/ECG  Sinus rhythm, PACS, PVCs, episodes of 3 beat NSVT. Short sinus tachycardia HR 150 this morning [ hard to tell on telemetry but maybe rate related LBBB] avg HR 80 - Personally Reviewed  Physical Exam  GEN: No acute distress.   Neck: JVD Cardiac: irregularly, irregular, no murmurs, rubs, or gallops.  Respiratory: Clear to auscultation bilaterally. GI: Soft, nontender, non-distended  MS: No edema  Assessment & Plan  54 year old woman with a history of heart failure with reduced ejection fraction, low-flow low gradient aortic stenosis (probably mild or moderate), moderate mitral insufficiency, history of high burden of PVCs (unable to perform ablation due to lack of PVCs during the procedure), chronic kidney disease stage III, hyperlipidemia, uncontrolled DM type II, history of COPD, tobacco use and pulmonary embolism, PAD with bilateral above-the-knee amputations who had been admitted at [facility] 1/20 with acute on chronic HF exacerbation with cardiorenal syndrome and stroke like symptoms. She was  transferred to [facility] for further evaluation.   Head imaging showing multiple infarcts throughout the brain, suspected to be cardio-embolic. Neurology following. AKI on CKD, possible over diuresis as patient was euvolemic-dry on arrival. Nephrology following.   Patient's family transitioned patient to DNR and comfort care, though comfort care has now been cancelled as patient's responsiveness is improving. DNR remains. Palliative following.   Acute on chronic HFrEF AKI on CKD Myoview  at time of diagnosis was WNL, though she is suspected to have underlying CAD with her hx Etiology possibly mixed On admission at [facility], ProBNP > 70,000.  Did receive IV diuresis though with worsening renal function this was stopped. Renal function has continued to improve off diuresis with Cr today 2.81. Nephrology following.   Echo this admission showed EF improvement to 50-55% with possible RWMA in RCA distrubution. Invasive coronary evaluation not recommended at this time with her acute CVA and AKI on CKD. Can re-address as clinical picture continues to change.   Net IO Since Admission: -990.82 mL [08/21/24 0738] On exam, appears to be mildly volume up though she does have severe hypoalbuminemia. Weight is up 5lbs from yesterday.  Will hold on diuresis today as renal function is continuing to improve off diuresis and she recently pulled out NG tube thus now decreasing po intake, suspect she may need diuresis soon.  GDMT on hold with AKI on CKD  and recent CVA [renal dysfunction and permissive HTN]  Frequent ectopy Seen by EP previously for high PVC burden, attempted PVC ablation though was aborted given very low frequency of PVCs at the time, including with isoproterenol .  Telemetry also showing frequent PACS and brief NSVT Keep K > 4 and Mag > 2. Will order mag for tomorrow am.   BB on hold, would like to restart when able   Hypertension BP: 160/60 Neurology following, has had permissive HTN     Severe PAD s/p bilateral BKA  CAD Continue ASA 81 mg Continue plavix  75 mg  Tobacco use NRT per primary  Hyperlipidemia LDL 119   HDL 36   Currently above goal, LDL goal < 55 Consider outpatient lipid clinic referral.  Continue crestor  40 mg  Elevated Troponin 70 -> 75 -> 128 Though RWMA noted on echocardiogram, troponin elevation suspected to be 2/2 demand ischemia given trend. Coronary evaluation on hold as above. As she clinically improves can reconsider coronary evaluation.  CVA Will continue to follow telemetry for AF, pending disposition and GOC could apply 30 day monitor for further evaluation. Could consider TEE as well though would also need to consider in context of situation with her current acute state. TEE unlikely to change current management, though patient is becoming more alert, continue to re-assess.  Neurology following  VHD AI and MR improved when compared to prior echocardiogram, continue to follow outpatient.   Per primary T2DM [A1c 10.1%] CVA AKI on CKD Anemia of chronic disease GOC    For questions or updates, please contact [facility] Please consult [email or web address] for contact info under       Signed, Leontine LOISE Salen, PA-C   History and all data above reviewed.  I personally took the history today, performed the physical exam and formulated the substantive portion of the assessment and plan.   She response yes know to questions.  She reports pain but can not localize on interview.  I reviewed all relevant tests and studies. Patient examined.  I agree with the findings as above.  The patient exam reveals COR:RRR  ,  Lungs: Decreased breath sounds   ,  Abd: Mild tenderness to deep palpation, Ext  Bilateral leg amputations   .  All available labs, radiology testing, previous records reviewed. Agree with documented assessment and plan. AMS/CVA:  Improving.  Actually looks like she is passing a swallowing study.  Though there appears to be an  embolic source there has not been evidence of atrial fib and she remains on Plavix  and ASA.  Out patient monitoring would be reasonable.  Otherwise conservative management with no plans for TEE.  Acute on chronic HFimEF:  EF 45 - 50% with regional wall motion abnormality per Dr Tyrone note.  Still holding off on diuresis given her AKI and no evidence of respiratory distress or acute pulmonary edema.  We will continue to reassess volume status.     Lynwood Raelene Trew  10:42 AM  08/21/2024   "

## 2024-08-21 NOTE — Progress Notes (Signed)
 " PROGRESS NOTE  [name]  FMW:[id number] DOB: 1971-02-12 DOA: 08/19/2024 PCP: [name] BRAVO, PA-C   Brief Narrative: Patient is a 54 year old female with history of HFrEF, PVCs, tobacco use, hypertension, peripheral artery disease, bilateral AKA, CKD stage IIIb, hyperlipidemia who was transferred from [facility] for  further management of progressive renal failure.  She was initially admitted there on 1/20 and was currently being managed for acute on chronic CHF exacerbation, AMS, left-sided weakness, concern for stroke, progressive renal failure, elevated troponin.  On presentation to there, she was confused, code stroke was called.  CT head showed focal hypodensity in the right caudate body and adjacent white matter concerning for stroke.  She continued to decline and was requested to transfer here.  MRI showed acute ischemic stroke.  Workup completed.  Nephrology, neurology, cardiology, palliative care were following.  PT recommending AIR on discharge.  Assessment & Plan:  Principal Problem:   Acute hypoxemic respiratory failure (HCC)  Acute ischemic stroke: MRI showed scattered infarcts including bilateral frontal, parietal, occipital, basal ganglia, left thalamus and cerebellum.  Suspected embolic phenomena.  Neurology following.  Currently on aspirin , statin, Plavix , PT/OT/speech consulted.  PT recommended AIR. She has global weakness but she might have more weakness on the left side.  Obeys commands today.  Alert and awake.  Has dysphagia  Altered mentation/dysphagia: Suspected to be from a stroke versus seizure.  Was started on Keppra at [facility], now not on keppra Patient is dysphasic.  Speech therapy following.  Currently has NG tube for feeding.  Consulted dietitian for core track.  Might need PEG in the long-term.  Will continue to monitor clinical progression. Mental status continues to improve.  She obeys commands.  Oriented to place  Acute on chronic HFrEF:  Echo done on 08/07/2024 showed EF of 35 to 40%.  Repeat echo done here showed EF of 50 to 55%, low normal left ventricular function, severely dilated left ventricular cavity, eccentric left ventricular hypertrophy, mild mitral valve regurgitation .cardiology already consulted and following.  BNP was elevated.  Currently appears euvolemic.  Earlier on Lasix , Aldactone, Entresto , currently on hold due to worsening renal function.  AKI in CKD stage IIIb/AGMA: Creatinine was elevated up to 4.2, now trending down.  Nephrology following.  On bicarb drip.  Looks like her baseline creatinine is around 2.3  History of coronary artery disease/elevated troponin: Troponin elevation likely from worsening renal function and heart failure.  On aspirin , Plavix , statin.   History of peripheral artery disease/carotid artery stenosis: Status post bilateral BKA  Uncontrolled diabetes type 2: Last A1c noted to be 10.1.  Currently on sliding scale.  Monitor blood sugars.  Diabetic coordinator consulted.  She takes insulin  at home.  Restarted Lantus  at 15 units,Daily.  Will adjust dose as appropriate.  Anemia of chronic disease: In the setting of CKD.  Currently hemoglobin stable  Hyperlipidemia: On statin.  LDL of 119  Tobacco dependence: Will be counseled for cessation  Goals of care: Patient with multiple comorbidities now with altered mentation, significant acute ischemic stroke, renal failure.  CODE STATUS DNR/DNI.  Palliative care consulted for goals of care.  She has bilateral AKA.  Poor quality of life.  Feeding initiated by NG tube.  If her mentation does not improve, she may be a candidate for comfort care.  Sister [name] understands this plan and anticipates improvement before discussing about comfort care/hospice options  Wound 08/19/24 Pressure Injury Sacrum Deep Tissue Pressure Injury - Purple or maroon localized  area of discolored intact skin or blood-filled blister due to damage of underlying soft tissue  from pressure and/or shear. (Active)        Wound 08/19/24 Pressure Injury Sacrum Deep Tissue Pressure Injury - Purple or maroon localized area of discolored intact skin or blood-filled blister due to damage of underlying soft tissue from pressure and/or shear. (Active)    DVT prophylaxis:heparin  injection 5,000 Units Start: 08/19/24 2200 SCDs Start: 08/19/24 1749     Code Status: Limited: Do not attempt resuscitation (DNR) -DNR-LIMITED -Do Not Intubate/DNI   Family Communication:Called and discussed with sister [name] on phone on 1/26  Patient status:Inpatient  Patient is from :home  Anticipated discharge to: Acute inpatient rehab  Estimated DC date: 2 to 3 days   Consultants: Cardiology, nephrology, neurology  Procedures: None yet  Antimicrobials:  Anti-infectives (From admission, onward)    None       Subjective: Patient seen and examined at bedside today.  Lying in bed.  Appears comfortable.  Not in any Distress.  Obeys commands today.  Lifted her right arm upon asking but could not lift her left arm. She speaks little.  Found to be dysphasic and on tube feeding which she pulled out last night, plan for another tube placement.  Objective: Vitals:   08/20/24 1932 08/20/24 2339 08/21/24 0423 08/21/24 0735  BP: (!) 146/59 (!) 156/55 (!) 140/73 (!) 148/42  Pulse: 62 70 73 72  Resp: 16 16 16 17   Temp: 98.4 F (36.9 C) 99 F (37.2 C) 97.8 F (36.6 C) 97.7 F (36.5 C)  TempSrc: Oral Axillary Axillary Axillary  SpO2: 98% 99% 98% 95%  Weight:   62.2 kg     Intake/Output Summary (Last 24 hours) at 08/21/2024 1032 Last data filed at 08/21/2024 0427 Gross per 24 hour  Intake --  Output 750 ml  Net -750 ml   Filed Weights   08/19/24 2134 08/20/24 0452 08/21/24 0423  Weight: 59.5 kg 60 kg 62.2 kg    Examination:   General exam: Overall comfortable, not in distress, lying on bed, chronically deconditioned HEENT: PERRL Respiratory system:  no wheezes or  crackles  Cardiovascular system: S1 & S2 heard, RRR.  Gastrointestinal system: Abdomen is nondistended, soft and nontender. Central nervous system: Alert and awake, obeys commands, oriented to place Extremities: Lateral AKA Skin: No rashes, no icterus   GU: Foley   Data Reviewed: I have personally reviewed following labs and imaging studies  CBC: Recent Labs  Lab 08/19/24 1839 08/20/24 0312 08/21/24 0257  WBC 11.5* 10.7* 8.9  NEUTROABS 8.8*  --   --   HGB 11.1* 11.2* 10.8*  HCT 34.6* 34.7* 33.9*  MCV 95.3 93.5 93.9  PLT 154 138* 137*   Basic Metabolic Panel: Recent Labs  Lab 08/19/24 1839 08/19/24 1848 08/20/24 0312 08/21/24 0257  NA 139  --  142 145  K 4.5  --  4.2 3.7  CL 108  --  110 105  CO2 13*  --  15* 25  GLUCOSE 259*  --  192* 194*  BUN 98*  --  96* 83*  CREATININE 3.96*  --  3.72* 2.81*  CALCIUM  8.0*  --  7.8* 7.5*  MG  --  2.3  --   --   PHOS  --  6.1*  --   --      Recent Results (from the past 240 hours)  Culture, blood (Routine X 2) w Reflex to ID Panel     Status:  None (Preliminary result)   Collection Time: 08/19/24  6:39 PM   Specimen: BLOOD LEFT ARM  Result Value Ref Range Status   Specimen Description BLOOD LEFT ARM  Final   Special Requests   Final    BOTTLES DRAWN AEROBIC AND ANAEROBIC Blood Culture results may not be optimal due to an inadequate volume of blood received in culture bottles   Culture   Final    NO GROWTH < 12 HOURS Performed at [facility], [address] [place], [name] [address]    Report Status PENDING  Incomplete  Culture, blood (Routine X 2) w Reflex to ID Panel     Status: None (Preliminary result)   Collection Time: 08/19/24  6:42 PM   Specimen: BLOOD RIGHT HAND  Result Value Ref Range Status   Specimen Description BLOOD RIGHT HAND  Final   Special Requests   Final    BOTTLES DRAWN AEROBIC AND ANAEROBIC Blood Culture adequate volume   Culture   Final    NO GROWTH < 12 HOURS Performed at [facility], [address], [place], [name] [address]    Report Status PENDING  Incomplete     Radiology Studies: VAS US  TRANSCRANIAL DOPPLER Result Date: 08/21/2024  Transcranial Doppler Patient Name:  TARRI GUILFOIL  Date of Exam:   08/20/2024 Medical Rec #: [id number]        Accession #:    [id number] Date of Birth: Dec 15, 1970        Patient Gender: F Patient Age:   18 years Exam Location:  [facility] Procedure:      VAS US  TRANSCRANIAL DOPPLER Referring Phys: PRAMOD SETHI --------------------------------------------------------------------------------  Indications: Stroke. Limitations: Patient movement and inability to turn head to the right for              visualization of the left transtemporal window Limitations for diagnostic windows: Unable to insonate left transtemporal window. Comparison Study: No prior study Performing Technologist: Alberta Lis RVS  Examination Guidelines: A complete evaluation includes B-mode imaging, spectral Doppler, color Doppler, and power Doppler as needed of all accessible portions of each vessel. Bilateral testing is considered an integral part of a complete examination. Limited examinations for reoccurring indications may be performed as noted.  +----------+---------------+----------+-----------+------------------+ RIGHT TCD Right VM (cm/s)Depth (cm)Pulsatility     Comment       +----------+---------------+----------+-----------+------------------+ MCA             87                    1.35                       +----------+---------------+----------+-----------+------------------+ ACA             -75                   1.45                       +----------+---------------+----------+-----------+------------------+ Term ICA                                      Unable to insonate +----------+---------------+----------+-----------+------------------+ PCA P1          -27                   1.73                        +----------+---------------+----------+-----------+------------------+  Opthalmic       16                    1.29                       +----------+---------------+----------+-----------+------------------+ ICA siphon                                    Unable to insonate +----------+---------------+----------+-----------+------------------+ Vertebral       -37                   1.28                       +----------+---------------+----------+-----------+------------------+  +----------+--------------+----------+-----------+------------------+ LEFT TCD  Left VM (cm/s)Depth (cm)Pulsatility     Comment       +----------+--------------+----------+-----------+------------------+ MCA                                          Unable to insonate +----------+--------------+----------+-----------+------------------+ ACA                                          Unable to insonate +----------+--------------+----------+-----------+------------------+ Term ICA                                     Unable to insonate +----------+--------------+----------+-----------+------------------+ PCA P1                                       Unable to insonate +----------+--------------+----------+-----------+------------------+ Opthalmic       15                   1.23                       +----------+--------------+----------+-----------+------------------+ ICA siphon                                   Unable to insonate +----------+--------------+----------+-----------+------------------+ Vertebral      -30                   1.69                       +----------+--------------+----------+-----------+------------------+  +------------+-------+-------+             VM cm/sComment +------------+-------+-------+ Prox Basilar  -56          +------------+-------+-------+ Summary:  Absent left temporal window limits evaluation of anterior circulation on the left. Mildly  elevated right middle and anterior cerbral artery mean flow velocitie sof unclear significance. Globally elevated pulsatility indices suggests diffus eintracranial atherosclerosis likely. *See table(s) above for TCD measurements and observations.  Diagnosing physician: Eather Popp MD Electronically signed by Eather Popp MD on 08/21/2024 at 7:43:31 AM.    Final    VAS US  CAROTID Result Date: 08/21/2024 Carotid Arterial Duplex Study Patient Name:  DARICE JONETTA DITCH  Date of  Exam:   08/20/2024 Medical Rec #: [id number]        Accession #:    [id number] Date of Birth: Jan 16, 1971        Patient Gender: F Patient Age:   1 years Exam Location:  [facility] Procedure:      VAS US  CAROTID Referring Phys: PRAMOD SETHI --------------------------------------------------------------------------------  Indications:       CVA. Risk Factors:      Hypertension, hyperlipidemia, current smoker, prior MI,                    coronary artery disease, PAD. Other Factors:     Bilateral AKA, HFrEF, CKD. Limitations        Today's exam was limited due to the patient's inability or                    unwillingness to cooperate/altered mental status. Patient                    would not keep head turned Comparison Study:  Prior carotid duplex done 09/22/23 indicating 1-39% right ICA                    stenosis and 40-59% left ICA stenosis Performing Technologist: Alberta Lis RVS  Examination Guidelines: A complete evaluation includes B-mode imaging, spectral Doppler, color Doppler, and power Doppler as needed of all accessible portions of each vessel. Bilateral testing is considered an integral part of a complete examination. Limited examinations for reoccurring indications may be performed as noted.  Right Carotid Findings: +----------+--------+--------+--------+------------------+------------------+           PSV cm/sEDV cm/sStenosisPlaque DescriptionComments            +----------+--------+--------+--------+------------------+------------------+ CCA Prox  99      19                                intimal thickening +----------+--------+--------+--------+------------------+------------------+ CCA Distal81      19                                intimal thickening +----------+--------+--------+--------+------------------+------------------+ ICA Prox  144     37      1-39%   calcific                             +----------+--------+--------+--------+------------------+------------------+ ICA Mid   96      29                                                   +----------+--------+--------+--------+------------------+------------------+ ICA Distal126     36                                                   +----------+--------+--------+--------+------------------+------------------+ ECA       486     12      >50%    heterogenous                         +----------+--------+--------+--------+------------------+------------------+ +----------+--------+-------+--------+-------------------+  PSV cm/sEDV cmsDescribeArm Pressure (mmHG) +----------+--------+-------+--------+-------------------+ [id number]                                        +----------+--------+-------+--------+-------------------+ +---------+--------+--+--------+--+---------+ VertebralPSV cm/s36EDV cm/s11Antegrade +---------+--------+--+--------+--+---------+  Left Carotid Findings: +----------+--------+--------+--------+----------------------+---------+           PSV cm/sEDV cm/sStenosisPlaque Description    Comments  +----------+--------+--------+--------+----------------------+---------+ CCA Prox  174     35              heterogenous                    +----------+--------+--------+--------+----------------------+---------+ CCA Distal169     25              heterogenous                     +----------+--------+--------+--------+----------------------+---------+ ICA Prox  263     52      40-59%  irregular and calcificShadowing +----------+--------+--------+--------+----------------------+---------+ ICA Mid   118     29                                              +----------+--------+--------+--------+----------------------+---------+ ICA Distal128     33                                              +----------+--------+--------+--------+----------------------+---------+ ECA       275     25                                              +----------+--------+--------+--------+----------------------+---------+ +----------+--------+--------+--------+-------------------+           PSV cm/sEDV cm/sDescribeArm Pressure (mmHG) +----------+--------+--------+--------+-------------------+ Subclavian115                                         +----------+--------+--------+--------+-------------------+ +---------+--------+--------+------------+ VertebralPSV cm/sEDV cm/sNot assessed +---------+--------+--------+------------+   Summary: Right Carotid: Velocities in the right ICA are consistent with a 1-39% stenosis. Left Carotid: Velocities in the left ICA are consistent with a 40-59% stenosis. Vertebrals:  Right vertebral artery demonstrates antegrade flow. Left vertebral              not assessed. Subclavians: Normal flow hemodynamics were seen in bilateral subclavian              arteries. *See table(s) above for measurements and observations.  Electronically signed by Eather Popp MD on 08/21/2024 at 7:41:15 AM.    Final    US  RENAL Result Date: 08/20/2024 EXAM: RETROPERITONEAL ULTRASOUND OF THE KIDNEYS 08/20/2024 06:04:00 PM TECHNIQUE: Real-time ultrasonography of the retroperitoneum, specifically the kidneys and urinary bladder, was performed. COMPARISON: US  Renal 10/14/2023. CLINICAL HISTORY: Acute on chronic renal failure. FINDINGS: RIGHT KIDNEY: Right kidney  measures 7.2 x 2.9 x 3.2 cm. Calculated volume is 35 ml. Diffuse atrophic changes are noted. No hydronephrosis. LEFT KIDNEY: Left kidney measures 11.0 x 4.7 x 4.2 cm. Calculated volume: 114 ml. Normal cortical echogenicity.  No hydronephrosis. BLADDER: The bladder is decompressed by a foley catheter. IMPRESSION: 1. Right kidney with diffuse atrophic changes, measuring 7.2 x 2.9 x 3.2 cm (calculated volume 35 ml). 2. Left kidney measuring 11.0 x 4.7 x 4.2 cm (calculated volume 114 ml). Electronically signed by: Oneil Devonshire MD 08/20/2024 06:23 PM EST RP Workstation: [id number]   ECHOCARDIOGRAM LIMITED Result Date: 08/20/2024    ECHOCARDIOGRAM LIMITED REPORT   Patient Name:   KENNETTA PAVLOVIC Date of Exam: 08/20/2024 Medical Rec #:  [id number]       Height:       53.0 in Accession #:    [id number]      Weight:       132.3 lb Date of Birth:  03/13/1971       BSA:          1.431 m Patient Age:    53 years        BP:           150/53 mmHg Patient Gender: F               HR:           52 bpm. Exam Location:  Inpatient Procedure: Limited Echo (Both Spectral and Color Flow Doppler were utilized            during procedure). Indications:    Elevated Troponin  History:        Patient has prior history of Echocardiogram examinations, most                 recent 08/07/2024. Signs/Symptoms:Hypertensive Heart Disease.  Sonographer:    Nathanel Devonshire Referring Phys: [id number] BELAL A SULEIMAN IMPRESSIONS  1. Left ventricular ejection fraction, by estimation, is 50 to 55%. Left ventricular ejection fraction by 2D MOD biplane is 53.7 %. The left ventricle has low normal function. The left ventricular internal cavity size was moderately to severely dilated.  There is severe eccentric left ventricular hypertrophy.  2. The mitral valve is normal in structure. Mild mitral valve regurgitation.  3. The aortic valve is normal in structure. Aortic valve regurgitation is mild.  4. The inferior vena cava is normal in size with greater than 50%  respiratory variability, suggesting right atrial pressure of 3 mmHg. Comparison(s): Prior images reviewed side by side. Limited for EF. EF has improved. AI and MR improved and now mild. FINDINGS  Left Ventricle: Left ventricular ejection fraction, by estimation, is 50 to 55%. Left ventricular ejection fraction by 2D MOD biplane is 53.7 %. The left ventricle has low normal function. The left ventricular internal cavity size was moderately to severely dilated. There is severe eccentric left ventricular hypertrophy. Mitral Valve: The mitral valve is normal in structure. Mild mitral valve regurgitation. Aortic Valve: The aortic valve is normal in structure. Aortic valve regurgitation is mild. Venous: The inferior vena cava is normal in size with greater than 50% respiratory variability, suggesting right atrial pressure of 3 mmHg. LEFT VENTRICLE PLAX 2D                        Biplane EF (MOD) LVIDd:         5.90 cm         LV Biplane EF:   Left LVIDs:         4.40 cm                          ventricular  LV PW:         1.10 cm                          ejection LV IVS:        1.20 cm                          fraction by                                                 2D MOD                                                 biplane is LV Volumes (MOD)                                53.7 %. LV vol d, MOD    108.0 ml A2C: LV vol d, MOD    115.0 ml A4C: LV vol s, MOD    51.3 ml A2C: LV vol s, MOD    55.9 ml A4C: LV SV MOD A2C:   56.7 ml LV SV MOD A4C:   115.0 ml LV SV MOD BP:    61.7 ml IVC IVC diam: 1.85 cm Franck Azobou Tonleu Electronically signed by Joelle Ren Ny Signature Date/Time: 08/20/2024/10:48:36 AM    Final    DG Abd 1 View Result Date: 08/20/2024 EXAM: 1 VIEW XRAY OF THE ABDOMEN 08/20/2024 12:44:43 AM COMPARISON: None available. CLINICAL HISTORY: Nasogastric tube present. FINDINGS: LINES, TUBES AND DEVICES: Enteric tube in place with distal tip within the stomach, the proximal side port lies at the  gastroesophageal junction. This should be advanced deeper into the stomach. BOWEL: Nonobstructive bowel gas pattern. Scattered large and small bowel gas is noted. No free air is seen. SOFT TISSUES: Cholecystectomy clips noted. BONES: No acute fracture. IMPRESSION: 1. Enteric tube in place with distal tip within the stomach and proximal side port at the gastroesophageal junction; this should be advanced deeper into the stomach. 2. No free air. Electronically signed by: Oneil Devonshire MD 08/20/2024 12:47 AM EST RP Workstation: [id number]   MR BRAIN WO CONTRAST Result Date: 08/19/2024 EXAM: MRI Brain Without Contrast 08/19/2024 10:13:21 PM TECHNIQUE: Multiplanar multisequence MRI of the head/brain was performed without the administration of intravenous contrast. COMPARISON: None available. CLINICAL HISTORY: Neuro deficit, acute, stroke suspected Acute neurological deficit, stroke suspected. FINDINGS: Motion limited and incomplete study.  No SWI and no T2.  Within this limitation: BRAIN AND VENTRICLES: Significantly motion limited study with many acute infarcts in bilateral frontal, parietal and occipital lobes, right basal ganglia, left thalamus and left cerebellum. No obvious intracranial hemorrhage. No mass. No midline shift. No hydrocephalus. The sella is unremarkable. Normal flow voids. ORBITS: No significant abnormality. SINUSES AND MASTOIDS: No significant abnormality. BONES AND SOFT TISSUES: Normal marrow signal. No soft tissue abnormality. IMPRESSION: 1. Significantly motion limited and incomplete study with many acute infarcts in bilateral frontal, parietal and occipital lobes, right basal ganglia, left thalamus and left cerebellum. Consider embolic etiology. Electronically signed by: Glendia Molt MD 08/19/2024 10:29 PM EST  RP Workstation: [id number]   DG CHEST PORT 1 VIEW Result Date: 08/19/2024 EXAM: 1 VIEW(S) XRAY OF THE CHEST 08/19/2024 06:54:00 PM COMPARISON: 08/22/2023 CLINICAL HISTORY: Cough.  FINDINGS: LINES, TUBES AND DEVICES: Enteric tube in place with tip and side port below the hemidiaphragm. LUNGS AND PLEURA: Basilar airspace opacities are noted, right greater than left. No pleural effusion. No pneumothorax. HEART AND MEDIASTINUM: Cardiomegaly. Aortic calcification. BONES AND SOFT TISSUES: No acute osseous abnormality. IMPRESSION: 1. Basilar airspace opacities, right greater than left. 2. Enteric tube tip and side port project below the diaphragm. Electronically signed by: Oneil Devonshire MD 08/19/2024 07:03 PM EST RP Workstation: [id number]    Scheduled Meds:  aspirin   81 mg Per Tube Daily   Chlorhexidine  Gluconate Cloth  6 each Topical Daily   clopidogrel   75 mg Per Tube Daily   heparin   5,000 Units Subcutaneous Q8H   insulin  aspart  0-15 Units Subcutaneous TID WC   insulin  aspart  0-5 Units Subcutaneous QHS   insulin  glargine  15 Units Subcutaneous Daily   rosuvastatin   40 mg Per Tube QPM   Continuous Infusions:  feeding supplement (GLUCERNA 1.2 CAL) 1,000 mL (08/20/24 1324)   sodium bicarbonate  150 mEq in sterile water  1,150 mL infusion 100 mL/hr at 08/20/24 2344     LOS: 2 days   Ivonne Mustache, MD [facility] P1/26/2026, 10:32 AM  "

## 2024-08-21 NOTE — Progress Notes (Signed)
 [facility] Progress Note    Assessment/ Plan:   # AKI on CKD 3b - b/l creatinine 1.7- 2.3, from Jan- June 2025, eGFR 26- 35 ml/min - creat 2.0 on admit 1/20 in [place], since then has climbed to 4.2 on 1/24 - pt was diuresed on 1/20 and 1/21 for suspected CHF - no hypotension, no contrast - bp's here are stable around 135/80  - UA shows prot > 300, no wbc/ rbc's - renal US  pending  - lasix  held 1/22 (prior to transfer) due to rising creatinine - initial exam showed pt euvolemic to dry, on RA, flat neck veins, dry mouth - AKI suspect due to diuresis +/- entresto  - IV bicarb gtt 85 cc/hr was started 1/24 -> now at 100/hr. Appears euvolemic - creat down yest at 3.9 -> 3.7 -> 2.8, good UOP - Really not far from her baseline; should be able to stop the bicarb in the next 24-48hrs.  Signing off at this time; please reconsult as needed.  # acute on chronic HFrEF/ hx EF 35-40% - presented w/ high BNP, felt to have CHF - thoracentesis deferred on presentation as was not hypoxic - they held lasix , aldactone and entresto  on 1/22 due to rising creatinine - CXR 1/24 here was clear   # metabolic acidosis - from AKI/ CKD - cont bicarb gtt at 100 cc/hr    # suspected acute CVA, R caudate - was unresponsive and made comfort care, then pt responded and comfort care was cancelled - pt remains DNR-Limited  # PAD/ hx bilat AKA  Subjective:  Seen in room Takes a while to wake her up; confused, has mittens on. Pulled NGT overnight. UOP 500 cc prior day and 787ml/24hrs.  Presentation summary: 54 y.o. year-old w/ PMH significant for anemia, asthma, CKD 3, COPD, depression, diabetes, HTN, fatty liver, HFrEF EF 35 to 40%, PAD status post bilateral AKA, H/O PE, who presented to [facility] on 08[facility]15/2024 brought in by family saying that she had been sick for the past month or so with intermittent nausea vomiting and diarrhea and reduced p.o. intake.  Over the last couple of days  her urine output had reduced as well as her p.o. intake and she developed abdominal pain.  In the ED BP was greater than 70,000, creatinine 2.0, CT chest showed moderate right and mild to moderate left pleural effusions with scattered ground glass representing possible edema.  Patient was admitted for IV diuresis and IV antibiotics for UTI.  Her Entresto  and Aldactone and Jardiance  were continued.  January 21, patient became less responsive and nonverbal throughout the day.  She was able to make eye contact but then became completely unresponsive not moving the left arm to painful stimulus.  A code stroke was called and a CT head showed infarct of the right caudate.  Patient was moved to ICU.  IV Lasix  was continued.  The next day patient had poor urine output and rising creatinine.  Oxygen was required.  Patient was felt to be declining and family conversations were made and they felt that she was approaching end-of-life.  She was transitioned to comfort care with DNR and DNI CODE STATUS.  IV Lasix  was stopped and O2 provided as needed.  On January 23, patient was a bit more alert and started to open her eyes and track, she can answer her name and a couple simple questions.  NG tube was placed.  Creatinine was up to 3.7, Lasix  continued to be on  hold.  Entresto  and Aldactone were put on hold.  Urine output was poor.  Mild hyperkalemia.  [facility] was called to see if they will accept the patient given her progressive renal failure.  Patient was accepted.  We are asked to see for renal failure. Pt seen in hospital room.  Her eyes are open she responds verbally.  She is a bit confused.  She got 2026 with some help.  Did not know where she was.  Knew that the president was Trump.  Denied any shortness of breath, chest pain, abdominal pain.  Poor historian in general    Vitals:   08/21/24 1135 08/21/24 1200 08/21/24 1216 08/21/24 1228  BP: (!) 167/71 (!) 171/93  (!) 159/63  Pulse:    77  Resp: 18     Temp: 98  F (36.7 C)     TempSrc: Axillary     SpO2: 97%   97%  Weight:      Height:   5' 8 (1.727 m)     Exam: Gen no distress, confused, pleasant otherwise Sclera anicteric, throat remains dry flat neck veins Chest clear bilat to bases, on RA RRR no MRG Abd soft ntnd no mass or ascites +bs GU - foley cath in place w/ clear light yellowish urine  Ext bilat AKA, absolutely no LE / UE edema Neuro is alert but disoriented, follows simple commands     Home bp meds: Lasix  Propranolol  Entresto          Date                             Creat               eGFR                           AKI peak Cr      2013- 2016                  0.49- 1.01 2023                            1.22- 1.63 Jan-feb 2025               1.70- 2.18        26- 35 ml/min              2.35 June 2025                   1.90                 31 ml/min 1/20                             2.03                 29 1/21                             2.15                 27                     1/22  3.07                 17 1/23                             3.79                              08/19/24                        4.22                 12 ml/min (in [place]) 08/19/24                        3.96                 Here at [facility]       BP- bp's are stable here, no hypotension , 135/80 range I/O: not recorded   UA 1/25- prot > 300, 0-5 rbc/ wbc, epis 6-10  Renal US  - pending  CXR 1/24 -  IMPRESSION: Basilar airspace opacities, right greater than left. Enteric tube tip and side port project below the diaphragm.      Recent Labs  Lab 08/19/24 1839 08/19/24 1848 08/20/24 0312 08/21/24 0257  HGB 11.1*  --  11.2* 10.8*  ALBUMIN 3.1*  --   --  2.8*  CALCIUM  8.0*  --  7.8* 7.5*  PHOS  --  6.1*  --   --   CREATININE 3.96*  --  3.72* 2.81*  K 4.5  --  4.2 3.7   No results for input(s): IRON, TIBC, FERRITIN in the last 168 hours. Inpatient medications:  aspirin   81 mg Oral Daily   Chlorhexidine   Gluconate Cloth  6 each Topical Daily   clopidogrel   75 mg Oral Daily   feeding supplement  237 mL Oral TID BM   heparin   5,000 Units Subcutaneous Q8H   insulin  aspart  0-15 Units Subcutaneous TID WC   insulin  aspart  0-5 Units Subcutaneous QHS   insulin  glargine  15 Units Subcutaneous Daily   multivitamin with minerals  1 tablet Oral Daily   rosuvastatin   40 mg Oral QPM    sodium bicarbonate  150 mEq in sterile water  1,150 mL infusion 100 mL/hr at 08/21/24 1212   acetaminophen  **OR** acetaminophen , albuterol , ALPRAZolam , hydrALAZINE , ondansetron  **OR** ondansetron  (ZOFRAN ) IV

## 2024-08-21 NOTE — Evaluation (Signed)
 Speech Language Pathology Evaluation Patient Details Name: [name] MRN: [id number] DOB: [birth year] Today's Date: 08/21/2024 Time: 8954-8897 SLP Time Calculation (min) (ACUTE ONLY): 17 min  Problem List:  Patient Active Problem List   Diagnosis Date Noted   Acute hypoxemic respiratory failure (HCC) 08/19/2024   Type 2 diabetes mellitus with hyperlipidemia (HCC) 08/22/2023   COPD (chronic obstructive pulmonary disease) (HCC) 08/22/2023   Obesity (BMI 30-39.9) 08/22/2023   Acute on chronic systolic CHF (congestive heart failure) (HCC) 08/21/2023   Abnormal CT scan, small bowel 06/18/2023   Impingement syndrome of left shoulder 10/22/2022   Debility 10/01/2021   CKD (chronic kidney disease) stage 3, GFR 30-59 ml/min (HCC) 09/25/2021   Hyponatremia 09/25/2021   Leukocytosis 09/25/2021   PAD (peripheral artery disease) 09/24/2021   COPD not affecting current episode of care 09/24/2021   Tobacco dependence 09/24/2021   Nausea with vomiting 03/12/2016   Dysphagia, pharyngoesophageal phase 08/24/2014   Constipation 04/20/2014   Liver abscess 02/21/2014   RUQ pain 01/31/2014   Septic shock (HCC) 02/05/2012   Hypokalemia 02/05/2012   Normocytic anemia 02/05/2012   Acute respiratory failure with hypoxia (HCC) 01/28/2012   Appendicitis 01/28/2012   ARDS (adult respiratory distress syndrome) (HCC) 01/28/2012   Diabetes mellitus (HCC) 01/28/2012   Asthma 01/28/2012   Hypertension 01/28/2012   Past Medical History:  Past Medical History:  Diagnosis Date   Anemia 02/05/2012   Anxiety    Arthritis    Asthma    B12 deficiency    CKD (chronic kidney disease) stage 3, GFR 30-59 ml/min (HCC) 09/25/2021   COPD (chronic obstructive pulmonary disease) (HCC)    Depression    Diabetes (HCC)    Elevated LFTs    Essential hypertension    Fatty liver    Folate deficiency    GERD (gastroesophageal reflux disease)    Headache(784.0)    Heart failure (HCC)    PAD (peripheral artery  disease)    Status post bilateral AKA   PE (pulmonary embolism)    Xarelto   Pneumonia 2013   PVC (premature ventricular contraction)    Sepsis (HCC)    Type 2 diabetes mellitus (HCC)    Vitamin D deficiency    Past Surgical History:  Past Surgical History:  Procedure Laterality Date   ABDOMINAL AORTOGRAM W/LOWER EXTREMITY N/A 09/05/2021   Procedure: ABDOMINAL AORTOGRAM W/LOWER EXTREMITY;  Surgeon: Magda Debby SAILOR, MD;  [facility]: [facility];  Service: Cardiovascular;  Laterality: N/A;   AMPUTATION Right 09/24/2021   Procedure: RIGHT ABOVE KNEE AMPUTATION;  Surgeon: Magda Debby SAILOR, MD;  Location: [facility] OR;  Service: Vascular;  Laterality: Right;   APPENDECTOMY  2013   CHOLECYSTECTOMY     DIAGNOSTIC LAPAROSCOPY     ESOPHAGEAL DILATION N/A 10/09/2014   Procedure: ESOPHAGEAL DILATION;  Surgeon: Margo LITTIE Haddock, MD;  Location: [facility] ORS;  Service: Endoscopy;  Laterality: N/A;  #15 and #16 savory   ESOPHAGOGASTRODUODENOSCOPY (EGD) WITH PROPOFOL  N/A 10/09/2014   Dr. Haddock: mild erosive gastritis, Savary dilation. Nausea and vomiting most likely due to uncontroleld blood sugars, reflux, and gastritis.    PVC ABLATION N/A 02/04/2024   Procedure: PVC ABLATION;  Surgeon: Nancey Eulas BRAVO, MD;  Location: [facility] INVAS[facility] LAB;  Service: Cardiovascular;  Laterality: N/A;   HPI:  54 y.o. female presents to [facility] 08/19/24 from [facility] for evaluation of progressive renal failure. Pt also with AMS w/ L sided weakness and acute on chronic CHF exacerbation. MRI brain showed multiple acute  infarcts in bilateral frontal, parietal and occipital lobes, right basal ganglia, left thalamus and left cerebellum, possibly embolic. PMHx: HFrEF (30-35%), PVCs, tobacco abuse, HTN, PAD, bilateral AKA, CKD III, HLD, PVD   Assessment / Plan / Recommendation Clinical Impression  Pt presents with broad cognitive/llinguistic deficits s/p diffuse bilateral infarcts. Primary deficits are related to poor attention,  delayed initiation and response times, absence of responses rather than inaccurate responses. There was a paucity of spontaneous speech and when posed with questions, there was often no response at all.  When pressed, she tended to perseverate on prior verbalization (e.g., repeated her name despite the query changing).  Similarly, when asked to follow a command, there was often no response.  Recommend SLP f/u here and at next level of care to address cognitive deficits s/p CVA.    SLP Assessment  SLP Recommendation/Assessment: Patient needs continued Speech Language Pathology Services SLP Visit Diagnosis: Cognitive communication deficit (R41.841)     Assistance Recommended at Discharge  Frequent or constant Supervision/Assistance  Functional Status Assessment Patient has had a recent decline in their functional status and demonstrates the ability to make significant improvements in function in a reasonable and predictable amount of time.  Frequency and Duration min 2x/week  2 weeks      SLP Evaluation Cognition  Overall Cognitive Status: Impaired/Different from baseline Arousal/Alertness: Awake/alert Orientation Level: Disoriented to situation;Disoriented to time;Disoriented to place;Oriented to person Attention: Focused;Sustained Focused Attention: Appears intact Sustained Attention: Impaired Sustained Attention Impairment: Verbal basic;Functional basic Memory: Impaired Memory Impairment: Storage deficit Awareness: Impaired Awareness Impairment: Intellectual impairment Problem Solving: Impaired Problem Solving Impairment: Verbal basic       Comprehension  Auditory Comprehension Overall Auditory Comprehension: Impaired Yes/No Questions: Impaired Basic Biographical Questions: 26-50% accurate Commands: Impaired One Step Basic Commands:  (absence of resp0onse)    Expression Expression Primary Mode of Expression: Verbal Verbal Expression Overall Verbal Expression:  Impaired Naming: Impairment Confrontation: Impaired Convergent: 50-74% accurate Written Expression Dominant Hand: Left Written Expression: Not tested   Oral / Motor  Oral Motor/Sensory Function Overall Oral Motor/Sensory Function: Within functional limits Motor Speech Overall Motor Speech: Appears within functional limits for tasks assessed            [name] 08/21/2024, 11:20 AM Palma L. Vona, MA CCC/SLP Clinical Specialist - Acute Care SLP Acute Rehabilitation Services Office number [phone]

## 2024-08-21 NOTE — Plan of Care (Signed)

## 2024-08-21 NOTE — Inpatient Diabetes Management (Signed)
 Inpatient Diabetes Program Recommendations  AACE/ADA: New Consensus Statement on Inpatient Glycemic Control   Target Ranges:  Prepandial:   less than 140 mg/dL      Peak postprandial:   less than 180 mg/dL (1-2 hours)      Critically ill patients:  140 - 180 mg/dL   Lab Results  Component Value Date   GLUCAP 186 (H) 08/21/2024   HGBA1C 7.6 (H) 08/21/2023    Latest Reference Range & Units 08/20/24 12:18 08/20/24 18:25 08/20/24 19:36 08/20/24 23:38 08/21/24 04:31 08/21/24 07:37 08/21/24 11:34  Glucose-Capillary 70 - 99 mg/dL 868 (H) 847 (H) 843 (H) 148 (H) 202 (H) 168 (H) 186 (H)  (H): Data is abnormally high  Review of Glycemic Control  Diabetes history: DM2  Outpatient Diabetes medications:  Jardiance  25mg  daily  Lantus  40 units BID   Current orders for Inpatient glycemic control:  Lantus  15 units daily  Novolog  0-15 units TID + 0-5 units at bedtime   Inpatient Diabetes Program Recommendations:   Noted Inpatient Diabetes Coordinator consult.   Team will continue to follow.   Thanks,  Lavanda Search, RN, MSN, [facility]  Inpatient Diabetes Coordinator  Pager [phone] (8a-5p)

## 2024-08-21 NOTE — Evaluation (Signed)
 Clinical/Bedside Swallow Evaluation Patient Details  Name: SHAVONTAE GIBEAULT MRN: [id number] Date of Birth: 11/18/1970  Today's Date: 08/21/2024 Time: SLP Start Time (ACUTE ONLY): 1030 SLP Stop Time (ACUTE ONLY): 1045 SLP Time Calculation (min) (ACUTE ONLY): 15 min  Past Medical History:  Past Medical History:  Diagnosis Date   Anemia 02/05/2012   Anxiety    Arthritis    Asthma    B12 deficiency    CKD (chronic kidney disease) stage 3, GFR 30-59 ml/min (HCC) 09/25/2021   COPD (chronic obstructive pulmonary disease) (HCC)    Depression    Diabetes (HCC)    Elevated LFTs    Essential hypertension    Fatty liver    Folate deficiency    GERD (gastroesophageal reflux disease)    Headache(784.0)    Heart failure (HCC)    PAD (peripheral artery disease)    Status post bilateral AKA   PE (pulmonary embolism)    Xarelto   Pneumonia 2013   PVC (premature ventricular contraction)    Sepsis (HCC)    Type 2 diabetes mellitus (HCC)    Vitamin D deficiency    Past Surgical History:  Past Surgical History:  Procedure Laterality Date   ABDOMINAL AORTOGRAM W/LOWER EXTREMITY N/A 09/05/2021   Procedure: ABDOMINAL AORTOGRAM W/LOWER EXTREMITY;  Surgeon: Magda Debby SAILOR, MD;  Location: [facility];  Service: Cardiovascular;  Laterality: N/A;   AMPUTATION Right 09/24/2021   Procedure: RIGHT ABOVE KNEE AMPUTATION;  Surgeon: Magda Debby SAILOR, MD;  Location: [facility] OR;  Service: Vascular;  Laterality: Right;   APPENDECTOMY  2013   CHOLECYSTECTOMY     DIAGNOSTIC LAPAROSCOPY     ESOPHAGEAL DILATION N/A 10/09/2014   Procedure: ESOPHAGEAL DILATION;  Surgeon: Margo LITTIE Haddock, MD;  Location: [facility];  Service: Endoscopy;  Laterality: N/A;  #15 and #16 savory   ESOPHAGOGASTRODUODENOSCOPY (EGD) WITH PROPOFOL  N/A 10/09/2014   Dr. Haddock: mild erosive gastritis, Savary dilation. Nausea and vomiting most likely due to uncontroleld blood sugars, reflux, and gastritis.    PVC ABLATION N/A 02/04/2024    Procedure: PVC ABLATION;  Surgeon: Nancey Eulas BRAVO, MD;  Location: [facility];  Service: Cardiovascular;  Laterality: N/A;   HPI:  54 y.o. female presents to [facility] 08/19/24 from [facility] for evaluation of progressive renal failure. Pt also with AMS w/ L sided weakness and acute on chronic CHF exacerbation. MRI brain showed multiple acute infarcts in bilateral frontal, parietal and occipital lobes, right basal ganglia, left thalamus and left cerebellum, possibly embolic. PMHx: HFrEF (30-35%), PVCs, tobacco abuse, HTN, PAD, bilateral AKA, CKD III, HLD, PVD    Assessment / Plan / Recommendation  Clinical Impression  PLAN: Start dysphagia 3 diet; thin liquids.  Meds with water  (give with applesauce if she coughs).  Needs assistance with self-feeding - she is left handed and has LUE weakness.     Pt did very well with swallowing/eating. Oral mechanism exam normal excluding tongue surface discoloration; pt is edentulous.  No focal CN deficits. Pt drank 6 oz of ice water  without concern.  She consumed a container of applesauce and several graham crackers with functional mastication despite absence of teeth.  No dysphagia noted; no concerns for aspiration.  She will benefit from dentures being available (she explains they are at home.) No f/u for swallowing is needed.  SLP Visit Diagnosis: Dysphagia, unspecified (R13.10)    Aspiration Risk  No limitations    Diet Recommendation   Dysphagia 3 (mechanical soft);Thin  Medication Administration:  Whole meds with liquid    Other Recommendations Oral Care Recommendations: Oral care BID       Swallow Study   General HPI: 54 y.o. female presents to [facility]/19/24 from [facility] for evaluation of progressive renal failure. Pt also with AMS w/ L sided weakness and acute on chronic CHF exacerbation. MRI brain showed multiple acute infarcts in bilateral frontal, parietal and occipital lobes, right basal ganglia, left thalamus and left  cerebellum, possibly embolic. PMHx: HFrEF (30-35%), PVCs, tobacco abuse, HTN, PAD, bilateral AKA, CKD III, HLD, PVD Type of Study: Bedside Swallow Evaluation Previous Swallow Assessment: 1/23 at [facility] Diet Prior to this Study: NPO Temperature Spikes Noted: No Respiratory Status: Room air History of Recent Intubation: No Behavior/Cognition: Alert;Requires cueing Oral Cavity Assessment: Within Functional Limits Oral Care Completed by SLP: Recent completion by staff Oral Cavity - Dentition: Edentulous Vision: Functional for self-feeding Self-Feeding Abilities: Needs assist Patient Positioning: Upright in bed Baseline Vocal Quality: Normal Volitional Cough: Strong Volitional Swallow: Able to elicit    Oral/Motor/Sensory Function Overall Oral Motor/Sensory Function: Within functional limits   Ice Chips Ice chips: Within functional limits   Thin Liquid Thin Liquid: Within functional limits    Nectar Thick Nectar Thick Liquid: Not tested   Honey Thick Honey Thick Liquid: Not tested   Puree Puree: Within functional limits   Solid     Solid: Within functional limits      [name] 08/21/2024,11:09 AM  Palma L. Vona, MA CCC/SLP Clinical Specialist - Acute Care SLP Acute Rehabilitation Services Office number [phone]

## 2024-08-21 NOTE — Progress Notes (Signed)
 Initial Nutrition Assessment  DOCUMENTATION CODES:  Not applicable  INTERVENTION:  D/C Cortrak placement since patient now has a diet order Continue dysphagia 3 diet with thin liquids per SLP Add Ensure Plus High Protein po TID, each supplement provides 350 kcal and 20 grams of protein Add MVI with minerals daily  NUTRITION DIAGNOSIS:  Inadequate oral intake related to dysphagia as evidenced by other (comment) (NPO status since admission, diet just advanced to D3-thin).  GOAL:  Patient will meet greater than or equal to 90% of their needs  MONITOR:  PO intake, Supplement acceptance  REASON FOR ASSESSMENT:  Consult Enteral/tube feeding initiation and management, Assessment of nutrition requirement/status, Calorie Count  ASSESSMENT:  54 yo female admitted with renal failure, confusion. CT head showed right caudate infarct. PMH includes hypertension, diabetes, heart failure with EF 35 to 40%, PAD status post bilateral AKA, pulmonary embolism, anemia, asthma, B12 deficiency, CKD stage 3, COPD, fatty liver, folate deficiency, HF, vitamin D deficiency, PVC.  Unable to speak with patient or complete NFPE at this time.  Per chart review, she was 5'8 prior to bilateral above the knee amputations and she has had no change in appetite recently.  Diet was just advanced to dysphagia 3 with thin liquids by SLP today. Lunch will be her first meal. She will require assistance with self feeding per SLP d/t being L handed with LUE weakness.  NGT was placed 1/24 for enteral nutrition. She has been receiving Glucerna 1.2 Patient has pulled NGT out at least twice. Since diet has been advanced, plans to hold off on replacement of NGT or Cortrak for now.  Usual weight: 61.5 kg (09/03/23) Admit weight: 59.5 kg Current weight: 62.2 kg No significant weight changes noted PTA.   Nutritionally Relevant Medications: Scheduled Meds:  aspirin   81 mg Oral Daily   Chlorhexidine  Gluconate Cloth  6 each Topical  Daily   clopidogrel   75 mg Oral Daily   feeding supplement  237 mL Oral TID BM   heparin   5,000 Units Subcutaneous Q8H   insulin  aspart  0-15 Units Subcutaneous TID WC   insulin  aspart  0-5 Units Subcutaneous QHS   insulin  glargine  15 Units Subcutaneous Daily   multivitamin with minerals  1 tablet Oral Daily   rosuvastatin   40 mg Oral QPM   Continuous Infusions:  sodium bicarbonate  150 mEq in sterile water  1,150 mL infusion 100 mL/hr at 08/21/24 1212   PRN Meds:.acetaminophen  **OR** acetaminophen , albuterol , ALPRAZolam , hydrALAZINE , ondansetron  **OR** ondansetron  (ZOFRAN ) IV  Labs Reviewed: Phosphorus 6.1 (08/19/24) CBG ranges from 131-202 mg/dL over the last 24 hours HgbA1c 7.6 (08/21/23)   NUTRITION - FOCUSED PHYSICAL EXAM: Unable to complete at this time  Diet Order:   Diet Order             DIET DYS 3 Room service appropriate? Yes with Assist; Fluid consistency: Thin  Diet effective now                  EDUCATION NEEDS:  Not appropriate for education at this time  Skin:  Skin Assessment: Skin Integrity Issues: Skin Integrity Issues:: DTI DTI: sacrum  Last BM:  1/25  Height:  Ht Readings from Last 1 Encounters:  08/21/24 5' 8 (1.727 m)  5' 8 prior to bil AKA  Weight:  Wt Readings from Last 1 Encounters:  08/21/24 62.2 kg   Ideal Body Weight:  53.5 kg (adjusted for above the knee amputations)  BMI:  Body mass index is 24.8 kg/m (  adjusted for bil AKA)  Estimated Nutritional Needs:  Kcal:  1500-1700 Protein:  70-85 gm Fluid:  1.5-1.7 L   Suzen HUNT RD, LDN, CNSC Contact via secure chat. If unavailable, use group chat RD Inpatient.

## 2024-08-21 NOTE — TOC Initial Note (Signed)
 Transition of Care [facility]) - Initial/Assessment Note    Patient Details  Name: [name] MRN: [id number] Date of Birth: June 13, 1971  Transition of Care [facility]) CM/SW Contact:    Sudie Erminio Deems, RN Phone Number: 08/21/2024, 12:28 PM  Clinical Narrative: Patient presented for AMS, CHF and left sided weakness concerning for stroke. Neurology is following the patient. ICM spoke with sister and she states patient has DME electric wheelchair, hospital bed, and transfer board. PTA patient was from home with son that is her paid caregiver from [facility]- 5 days a week. Sister states she lives five minutes away and her brother stays next door. Patient is not currently active with any HH Services. Patient has previously been to two SNF's and did not like how she was treated: [facility] and a SNF in [place]. PT/OT recommendations are for CIR- Inpatient Rehab Coordinator will follow to see if patient progresses to a CIR candidate. CSW is aware to follow for SNF. ICM will continue to follow for disposition needs.                 Expected Discharge Plan: IP Rehab Facility     Patient Goals and CMS Choice            Expected Discharge Plan and Services   Discharge Planning Services: CM Consult Post Acute Care Choice: IP Rehab Living arrangements for the past 2 months: Single Family Home                   DME Agency: NA                  Prior Living Arrangements/Services Living arrangements for the past 2 months: Single Family Home Lives with:: Self, Adult Children Patient language and need for interpreter reviewed:: Yes Do you feel safe going back to the place where you live?: Yes        Care giver support system in place?: Yes (comment) Current home services: DME (electric wheelchair, hospital bed, and transfer board.) Criminal Activity/Legal Involvement Pertinent to Current Situation/Hospitalization: No - Comment as needed  Activities of Daily Living       Permission Sought/Granted Permission sought to share information with : Case Manager, Family Supports                Emotional Assessment   Attitude/Demeanor/Rapport: Unable to Assess Affect (typically observed): Unable to Assess   Alcohol  / Substance Use: Not Applicable Psych Involvement: No (comment)  Admission diagnosis:  Acute hypoxemic respiratory failure (HCC) [J96.01] Patient Active Problem List   Diagnosis Date Noted   Acute hypoxemic respiratory failure (HCC) 08/19/2024   Type 2 diabetes mellitus with hyperlipidemia (HCC) 08/22/2023   COPD (chronic obstructive pulmonary disease) (HCC) 08/22/2023   Obesity (BMI 30-39.9) 08/22/2023   Acute on chronic systolic CHF (congestive heart failure) (HCC) 08/21/2023   Abnormal CT scan, small bowel 06/18/2023   Impingement syndrome of left shoulder 10/22/2022   Debility 10/01/2021   CKD (chronic kidney disease) stage 3, GFR 30-59 ml/min (HCC) 09/25/2021   Hyponatremia 09/25/2021   Leukocytosis 09/25/2021   PAD (peripheral artery disease) 09/24/2021   COPD not affecting current episode of care 09/24/2021   Tobacco dependence 09/24/2021   Nausea with vomiting 03/12/2016   Dysphagia, pharyngoesophageal phase 08/24/2014   Constipation 04/20/2014   Liver abscess 02/21/2014   RUQ pain 01/31/2014   Septic shock (HCC) 02/05/2012   Hypokalemia 02/05/2012   Normocytic anemia 02/05/2012   Acute respiratory failure  with hypoxia (HCC) 01/28/2012   Appendicitis 01/28/2012   ARDS (adult respiratory distress syndrome) (HCC) 01/28/2012   Diabetes mellitus (HCC) 01/28/2012   Asthma 01/28/2012   Hypertension 01/28/2012   PCP:  Skillman, Katherine E, PA-C Pharmacy:   [facility] - [place], [name] - [address] [name] [address] Phone: [phone] Fax: [phone]  [facility] - [place], [place] - [facility] AT Portal to Registered [address] [place] [address] Phone: [phone] Fax: [phone]  [facility] [address] [name] [address] Phone: [phone] Fax: [phone]  [facility] [address] [name] [address] Phone: [phone] Fax: [phone]     Social Drivers of Health (SDOH) Social History: SDOH Screenings   Food Insecurity: No Food Insecurity (08/16/2024)   Received from [facility]  Housing: Low Risk (08/23/2023)  Transportation Needs: No Transportation Needs (08/16/2024)   Received from [facility]  Utilities: Low Risk (08/16/2024)   Received from [facility]  Alcohol  Screen: Low Risk (03/18/2022)  Depression (PHQ2-9): Medium Risk (03/18/2022)  Financial Resource Strain: Low Risk (08/16/2024)   Received from [facility]  Physical Activity: Inactive (08/16/2024)   Received from [facility]  Social Connections: Socially Isolated (08/16/2024)   Received from [facility]  Stress: No Stress Concern Present (08/16/2024)   [facility]  Tobacco Use: High Risk (08/18/2024)   [facility]  Health Literacy: Medium Risk (08/16/2024)   Received from [facility]   SDOH Interventions:     Readmission Risk Interventions     No data to display

## 2024-08-21 NOTE — Progress Notes (Signed)
 "  Palliative Medicine Inpatient Follow Up Note  HPI:  [name] is a 54 year old female with a past medical history significant for heart failure, peripheral arterial disease requiring bilateral above-the-knee amputations, tobacco abuse, anxiety, arthritis, depression, GERD, hypertension, and type 2 diabetes mellitus. [name] was admitted to [facility] from [facility] on January 24 in the setting of progressive renal failure. Palliative care has been asked to support additional goals of care conversations.   Today's Discussion 08/21/2024  I reviewed the chart notes including nursing notes from [facility], progress notes from Alan Snuffer, Dr. Melia, Dr. Rosemarie, Dr. Jillian, and Dr. Lavona. I also reviewed vital signs which appear generally stable, nursing flowsheets - pulled NGT and has passed swallow evaluation and been placed on a Dys 3 diet, medication administrations record, labs from today inclusive of BMP and CBC, and imaging inclusive of Xray KUB and Renal US .    I met with Lavergne at bedside this morning. She is more alert today and aware of self. She however becomes perseverate on being in the hospital and this seems to be the only thing she is able to say to me after a few minutes. She has no nonverbal signs of distress.   I was able to speak to patients sister, [name]. We reviewed that today [name] has passed her swallow evaluation and if she continues to have some improvements may be able to go to rehab. WE discussed her expressive aphasia and perseveration and how patients who suffer certain strokes can have this though the hope is with speech therapy there could be some improvement(s). Patient sister is hopeful that [name] can continue to improve. She does shares understanding though if [name] were to decline or worsen then the goals would shift.   Questions and concerns addressed/Palliative Support Provided.   Objective Assessment: Vital Signs Vitals:   08/21/24  1200 08/21/24 1228  BP: (!) 171/93 (!) 159/63  Pulse:  77  Resp:    Temp:    SpO2:  97%    Intake/Output Summary (Last 24 hours) at 08/21/2024 1524 Last data filed at 08/21/2024 1200 Gross per 24 hour  Intake 240 ml  Output 1150 ml  Net -910 ml   Last Weight  Most recent update: 08/21/2024  4:27 AM    Weight  62.2 kg (137 lb 2 oz)            Gen: Middle-age Caucasian female chronically ill in appearance HEENT: Nasogastric tube in place, dry mucous membranes CV: Regular rate and rhythm. PULM: On room air breathing is even and nonlabored ABD: soft/nontender  EXT: Bilateral AKA's Neuro: Opens eyes knows who she is and where she is - aphasia present  perseverates thereafter  SUMMARY OF RECOMMENDATIONS   DNAR/DNI   Patient showing signs of improvements - has passed swallow evaluation  Patients family aware of potential outcomes if patient were to decline and how this may involve transition in care focus  CIR evaluating for possible admission  Ongoing palliative medicine team support ______________________________________________________________________________________ [name] Palliative Medicine Team Team Cell Phone: [phone] Please utilize secure chat with additional questions, if there is no response within 30 minutes please call the above phone number  I personally spent a total of 35 minutes in the care of the patient today including preparing to see the patient, getting/reviewing separately obtained history, performing a medically appropriate exam/evaluation, counseling and educating, placing orders, documenting clinical information in the EHR, and coordinating care.     "

## 2024-08-21 NOTE — Progress Notes (Signed)
 Notified by CCMD pt has short burst of ST HR up to 143 at 0537. Strip saved

## 2024-08-22 ENCOUNTER — Encounter (HOSPITAL_COMMUNITY): Payer: Self-pay | Admitting: Internal Medicine

## 2024-08-22 ENCOUNTER — Other Ambulatory Visit: Payer: Self-pay

## 2024-08-22 DIAGNOSIS — Z794 Long term (current) use of insulin: Secondary | ICD-10-CM | POA: Diagnosis not present

## 2024-08-22 DIAGNOSIS — I6340 Cerebral infarction due to embolism of unspecified cerebral artery: Secondary | ICD-10-CM | POA: Diagnosis not present

## 2024-08-22 DIAGNOSIS — I509 Heart failure, unspecified: Secondary | ICD-10-CM | POA: Diagnosis not present

## 2024-08-22 DIAGNOSIS — Z89512 Acquired absence of left leg below knee: Secondary | ICD-10-CM | POA: Diagnosis present

## 2024-08-22 DIAGNOSIS — Z7984 Long term (current) use of oral hypoglycemic drugs: Secondary | ICD-10-CM | POA: Diagnosis not present

## 2024-08-22 DIAGNOSIS — J9601 Acute respiratory failure with hypoxia: Secondary | ICD-10-CM | POA: Diagnosis not present

## 2024-08-22 DIAGNOSIS — E1122 Type 2 diabetes mellitus with diabetic chronic kidney disease: Secondary | ICD-10-CM | POA: Diagnosis not present

## 2024-08-22 DIAGNOSIS — Z89511 Acquired absence of right leg below knee: Secondary | ICD-10-CM

## 2024-08-22 DIAGNOSIS — N1832 Chronic kidney disease, stage 3b: Secondary | ICD-10-CM | POA: Diagnosis not present

## 2024-08-22 DIAGNOSIS — I69391 Dysphagia following cerebral infarction: Secondary | ICD-10-CM | POA: Diagnosis not present

## 2024-08-22 DIAGNOSIS — I130 Hypertensive heart and chronic kidney disease with heart failure and stage 1 through stage 4 chronic kidney disease, or unspecified chronic kidney disease: Secondary | ICD-10-CM | POA: Diagnosis not present

## 2024-08-22 DIAGNOSIS — E1151 Type 2 diabetes mellitus with diabetic peripheral angiopathy without gangrene: Secondary | ICD-10-CM | POA: Diagnosis not present

## 2024-08-22 DIAGNOSIS — Z7982 Long term (current) use of aspirin: Secondary | ICD-10-CM | POA: Diagnosis not present

## 2024-08-22 DIAGNOSIS — E785 Hyperlipidemia, unspecified: Secondary | ICD-10-CM | POA: Diagnosis not present

## 2024-08-22 LAB — BASIC METABOLIC PANEL WITH GFR
Anion gap: 11 (ref 5–15)
BUN: 62 mg/dL — ABNORMAL HIGH (ref 6–20)
CO2: 33 mmol/L — ABNORMAL HIGH (ref 22–32)
Calcium: 7.5 mg/dL — ABNORMAL LOW (ref 8.9–10.3)
Chloride: 103 mmol/L (ref 98–111)
Creatinine, Ser: 2 mg/dL — ABNORMAL HIGH (ref 0.44–1.00)
GFR, Estimated: 29 mL/min — ABNORMAL LOW
Glucose, Bld: 77 mg/dL (ref 70–99)
Potassium: 3 mmol/L — ABNORMAL LOW (ref 3.5–5.1)
Sodium: 146 mmol/L — ABNORMAL HIGH (ref 135–145)

## 2024-08-22 LAB — GLUCOSE, CAPILLARY
Glucose-Capillary: 74 mg/dL (ref 70–99)
Glucose-Capillary: 71 mg/dL (ref 70–99)
Glucose-Capillary: 95 mg/dL (ref 70–99)
Glucose-Capillary: 162 mg/dL — ABNORMAL HIGH (ref 70–99)
Glucose-Capillary: 123 mg/dL — ABNORMAL HIGH (ref 70–99)

## 2024-08-22 LAB — MAGNESIUM: Magnesium: 2.3 mg/dL (ref 1.7–2.4)

## 2024-08-22 MED ORDER — INSULIN GLARGINE 100 UNIT/ML ~~LOC~~ SOLN
12.0000 [IU] | Freq: Every day | SUBCUTANEOUS | Status: DC
  Administered 2024-08-23 – 2024-08-25 (×3): 12 [IU] via SUBCUTANEOUS
  Filled 2024-08-22 (×4): qty 0.12

## 2024-08-22 MED ORDER — POTASSIUM CHLORIDE 20 MEQ PO PACK
40.0000 meq | PACK | ORAL | Status: AC
  Administered 2024-08-22 (×2): 40 meq via ORAL
  Filled 2024-08-22 (×2): qty 2

## 2024-08-22 MED ORDER — METOPROLOL SUCCINATE ER 25 MG PO TB24
25.0000 mg | ORAL_TABLET | Freq: Every day | ORAL | Status: DC
  Administered 2024-08-22: 25 mg via ORAL
  Filled 2024-08-22: qty 1

## 2024-08-22 NOTE — Plan of Care (Signed)

## 2024-08-22 NOTE — Progress Notes (Addendum)
 Physical Therapy Treatment Patient Details Name: [name] MRN: [id number] DOB: May 07, 1971 Today's Date: 08/22/2024   History of Present Illness 54 y.o. female presents [facility] 08/19/24 from [facility] for evaluation of progressive renal failure. Pt also with AMS w/ L sided weakness and acute on chronic CHF exacerbation. MRI brain showed multiple acute infarcts in bilateral frontal, parietal and occipital lobes, right basal ganglia, left thalamus and left cerebellum, possibly embolic. PMHx: HFrEF (30-35%), PVCs, tobacco abuse, HTN, PAD, bilateral AKA, CKD III, HLD, PVD    PT Comments  Pt required mod assist rolling R/L using bedrails, and +2 total assist supine<>sit. Oriented to self only. Perseverating on certain words. Pt supine in bed at end of session. Current POC remains appropriate.     If plan is discharge home, recommend the following: A lot of help with walking and/or transfers;A lot of help with bathing/dressing/bathroom;Assist for transportation;Help with stairs or ramp for entrance;Assistance with cooking/housework   Can travel by private Environmental Consultant    Recommendations for Other Services       Precautions / Restrictions Precautions Precautions: Fall;Other (comment) Recall of Precautions/Restrictions: Impaired Precaution/Restrictions Comments: foley, h/o bilat AKAs     Mobility  Bed Mobility Overal bed mobility: Needs Assistance Bed Mobility: Rolling, Supine to Sit, Sit to Supine Rolling: Mod assist, Used rails   Supine to sit: Total assist, +2 for physical assistance, +2 for safety/equipment Sit to supine: Total assist, +2 for physical assistance, +2 for safety/equipment   General bed mobility comments: multiple rolls R/L for pericare    Transfers                        Ambulation/Gait                   Stairs             Wheelchair Mobility     Tilt Bed    Modified Rankin  (Stroke Patients Only) Modified Rankin (Stroke Patients Only) Pre-Morbid Rankin Score: Slight disability Modified Rankin: Severe disability     Balance Overall balance assessment: Needs assistance Sitting-balance support: No upper extremity supported, Feet unsupported Sitting balance-Leahy Scale: Zero Sitting balance - Comments: total assist                                    Communication Communication Communication: Impaired Factors Affecting Communication: Difficulty expressing self  Cognition Arousal: Alert Behavior During Therapy: Flat affect   PT - Cognitive impairments: No family/caregiver present to determine baseline, Difficult to assess, Orientation, Memory, Awareness, Attention, Initiation, Sequencing, Problem solving, Safety/Judgement Difficult to assess due to: Impaired communication Orientation impairments: Place, Time, Situation                   PT - Cognition Comments: perseverating Following commands: Impaired Following commands impaired: Follows one step commands inconsistently, Follows one step commands with increased time    Cueing Cueing Techniques: Gestural cues, Verbal cues, Tactile cues  Exercises      General Comments General comments (skin integrity, edema, etc.): VSS on RA      Pertinent Vitals/Pain Pain Assessment Pain Assessment: Faces Faces Pain Scale: Hurts a little bit Pain Location: generalized Pain Descriptors / Indicators: Discomfort, Moaning Pain Intervention(s): Monitored during session, Limited activity within patient's tolerance    Home Living Family/patient expects  to be discharged to:: Other (Comment) Living Arrangements: Alone                      Prior Function            PT Goals (current goals can now be found in the care plan section) Acute Rehab PT Goals Patient Stated Goal: unable to state goal Progress towards PT goals: Progressing toward goals    Frequency    Min  2X/week      PT Plan      Co-evaluation   Reason for Co-Treatment: Complexity of the patient's impairments (multi-system involvement);Necessary to address cognition/behavior during functional activity;For patient/therapist safety;To address functional/ADL transfers PT goals addressed during session: Mobility/safety with mobility;Balance OT goals addressed during session: ADL's and self-care      AM-PAC PT 6 Clicks Mobility   Outcome Measure  Help needed turning from your back to your side while in a flat bed without using bedrails?: A Lot Help needed moving from lying on your back to sitting on the side of a flat bed without using bedrails?: Total Help needed moving to and from a bed to a chair (including a wheelchair)?: Total Help needed standing up from a chair using your arms (e.g., wheelchair or bedside chair)?: Total Help needed to walk in hospital room?: Total Help needed climbing 3-5 steps with a railing? : Total 6 Click Score: 7    End of Session   Activity Tolerance: Patient tolerated treatment well Patient left: in bed;with call bell/phone within reach;with bed alarm set Nurse Communication: Mobility status PT Visit Diagnosis: Unsteadiness on feet (R26.81);Other abnormalities of gait and mobility (R26.89);Muscle weakness (generalized) (M62.81);Hemiplegia and hemiparesis Hemiplegia - Right/Left: Left Hemiplegia - caused by: Cerebral infarction     Time: 0902-0925 PT Time Calculation (min) (ACUTE ONLY): 23 min  Charges:    $Therapeutic Activity: 8-22 mins PT General Charges $$ ACUTE PT VISIT: 1 Visit                     Sari MATSU., PT  Office # [phone]    Erven Sari Shaker 08/22/2024, 11:56 AM

## 2024-08-22 NOTE — Progress Notes (Addendum)
 " PROGRESS NOTE  [name]  FMW:[id number] DOB: 09-09-1970 DOA: 08/19/2024 PCP: Jeanette Comer BRAVO, PA-C   Brief Narrative: Patient is a 54 year old female with history of HFrEF, PVCs, tobacco use, hypertension, peripheral artery disease, bilateral AKA, CKD stage IIIb, hyperlipidemia who was transferred from [facility] for  further management of progressive renal failure.  She was initially admitted there on 1/20 and was currently being managed for acute on chronic CHF exacerbation, AMS, left-sided weakness, concern for stroke, progressive renal failure, elevated troponin.  On presentation to there, she was confused, code stroke was called.  CT head showed focal hypodensity in the right caudate body and adjacent white matter concerning for stroke.  She continued to decline and was requested to transfer here.  MRI showed acute ischemic stroke.  Workup completed.  Nephrology, neurology, cardiology, palliative care were following.  PT recommending AIR on discharge.  Has clinically improved significantly.  Can be discharged to acute inpatient rehab when possible  Assessment & Plan:  Principal Problem:   Acute hypoxemic respiratory failure (HCC)  Acute ischemic stroke: MRI showed scattered infarcts including bilateral frontal, parietal, occipital, basal ganglia, left thalamus and cerebellum.  Suspected embolic phenomena.  Neurology following.  Currently on aspirin , statin, Plavix , PT/OT/speech consulted.  PT recommended AIR.  TEE deferred for now. She has global weakness.  Obeys commands today.  Alert and awake.  Has expressive aphasia/dysarthria  Altered mentation/dysphagia: Suspected to be from a stroke versus seizure.  Was started on Keppra at [facility], now not on keppra Mentation has improved.  Currently alert and awake, oriented to place.  Has expressive aphasia.  On dysphagia 3 diet  Acute on chronic HFrEF: Echo done on 08/07/2024 showed EF of 35 to 40%.  Repeat echo done here showed  EF of 50 to 55%, low normal left ventricular function, severely dilated left ventricular cavity, eccentric left ventricular hypertrophy, mild mitral valve regurgitation .cardiology already consulted and following.  BNP was elevated.  Currently appears euvolemic.  Earlier on Lasix , Aldactone, Entresto , currently on hold due to worsening renal function.  Started on metoprolol   AKI in CKD stage IIIb/AGMA: Creatinine was elevated up to 4.2 now back to baseline.  Looks like her baseline creatinine is around 2.3.  Nephrology signed off  Hypertension: Remains hypertensive today.  Started on metoprolol   History of coronary artery disease/elevated troponin: Troponin elevation likely from worsening renal function and heart failure.  On aspirin , Plavix , statin.  Cardiology planning for further evaluation of her coronary arteries after she clinically improves  History of peripheral artery disease/carotid artery stenosis: Status post bilateral BKA  Uncontrolled diabetes type 2: Last A1c noted to be 10.1.  Currently on sliding scale.  Monitor blood sugars.  Diabetic coordinator consulted.  She takes insulin  at home.  Restarted Lantus  at 12 units,Daily.  Will adjust dose as appropriate.  Anemia of chronic disease: In the setting of CKD.  Currently hemoglobin stable  Hyperlipidemia: On statin.  LDL of 119  Hypokalemia: Being supplemented with potassium.  Being monitored  Goals of care: Patient with multiple comorbidities now with altered mentation, significant acute ischemic stroke, renal failure.  CODE STATUS DNR/DNI.  Palliative care consulted for goals of care.  She has bilateral AKA.  Poor quality of life.  Feeding initiated by NG tube.  If her mentation does not improve, she may be a candidate for comfort care.  Sister [name] understands this plan and anticipates improvement before discussing about comfort care/hospice options.  Patient is clinically improving  Wound 08/19/24 Pressure Injury Sacrum Deep  Tissue Pressure Injury - Purple or maroon localized area of discolored intact skin or blood-filled blister due to damage of underlying soft tissue from pressure and/or shear. (Active)      Nutrition Problem: Inadequate oral intake Etiology: dysphagia Wound 08/19/24 Pressure Injury Sacrum Deep Tissue Pressure Injury - Purple or maroon localized area of discolored intact skin or blood-filled blister due to damage of underlying soft tissue from pressure and/or shear. (Active)    DVT prophylaxis:heparin  injection 5,000 Units Start: 08/19/24 2200 SCDs Start: 08/19/24 1749     Code Status: Limited: Do not attempt resuscitation (DNR) -DNR-LIMITED -Do Not Intubate/DNI   Family Communication:Called and discussed with sister [name] on phone on 1/26  Patient status:Inpatient  Patient is from :home  Anticipated discharge to: Acute inpatient rehab  Estimated DC date: Whenever possible   Consultants: Cardiology, nephrology, neurology  Procedures: None yet  Antimicrobials:  Anti-infectives (From admission, onward)    None       Subjective: Patient seen and examined at bedside today.  Hemodynamically stable.  Comfortable.  Lying in bed.  Eating her breakfast.  Moving both hands.  Has expressive aphasia.  Does not look in any kind of distress.  On room air.  Objective: Vitals:   08/21/24 1959 08/21/24 2317 08/22/24 0353 08/22/24 0743  BP: (!) 161/66 (!) 161/60 (!) 157/70 (!) 180/77  Pulse: 77 68 74 75  Resp: 16 18 18 18   Temp: 97.6 F (36.4 C) 98.7 F (37.1 C) 98.8 F (37.1 C) 98.5 F (36.9 C)  TempSrc: Oral Oral Oral Oral  SpO2: 95% 96% 96% 97%  Weight:   (P) 62.9 kg   Height:        Intake/Output Summary (Last 24 hours) at 08/22/2024 1127 Last data filed at 08/22/2024 0356 Gross per 24 hour  Intake 240 ml  Output 950 ml  Net -710 ml   Filed Weights   08/20/24 0452 08/21/24 0423 08/22/24 0353  Weight: 60 kg 62.2 kg (P) 62.9 kg    Examination:   General exam:  Overall comfortable, not in distress, chronically deconditioned HEENT: PERRL Respiratory system:  no wheezes or crackles  Cardiovascular system: S1 & S2 heard, RRR.  Gastrointestinal system: Abdomen is nondistended, soft and nontender. Central nervous system: Alert and awake, unclear if she is oriented.  Has expressive aphasia/dysarthria, global weakness Extremities: B/L AKA Skin: No rashes, no ulcers,no icterus     Data Reviewed: I have personally reviewed following labs and imaging studies  CBC: Recent Labs  Lab 08/19/24 1839 08/20/24 0312 08/21/24 0257  WBC 11.5* 10.7* 8.9  NEUTROABS 8.8*  --   --   HGB 11.1* 11.2* 10.8*  HCT 34.6* 34.7* 33.9*  MCV 95.3 93.5 93.9  PLT 154 138* 137*   Basic Metabolic Panel: Recent Labs  Lab 08/19/24 1839 08/19/24 1848 08/20/24 0312 08/21/24 0257 08/22/24 0430  NA 139  --  142 145 146*  K 4.5  --  4.2 3.7 3.0*  CL 108  --  110 105 103  CO2 13*  --  15* 25 33*  GLUCOSE 259*  --  192* 194* 77  BUN 98*  --  96* 83* 62*  CREATININE 3.96*  --  3.72* 2.81* 2.00*  CALCIUM  8.0*  --  7.8* 7.5* 7.5*  MG  --  2.3  --   --  2.3  PHOS  --  6.1*  --   --   --  Recent Results (from the past 240 hours)  Culture, blood (Routine X 2) w Reflex to ID Panel     Status: None (Preliminary result)   Collection Time: 08/19/24  6:39 PM   Specimen: BLOOD LEFT ARM  Result Value Ref Range Status   Specimen Description BLOOD LEFT ARM  Final   Special Requests   Final    BOTTLES DRAWN AEROBIC AND ANAEROBIC Blood Culture results may not be optimal due to an inadequate volume of blood received in culture bottles   Culture   Final    NO GROWTH 3 DAYS Performed at [facility], [address] [place], [name] [address]    Report Status PENDING  Incomplete  Culture, blood (Routine X 2) w Reflex to ID Panel     Status: None (Preliminary result)   Collection Time: 08/19/24  6:42 PM   Specimen: BLOOD RIGHT HAND  Result Value Ref Range Status    Specimen Description BLOOD RIGHT HAND  Final   Special Requests   Final    BOTTLES DRAWN AEROBIC AND ANAEROBIC Blood Culture adequate volume   Culture   Final    NO GROWTH 3 DAYS Performed at [facility], [address] [place], [name] [address]    Report Status PENDING  Incomplete     Radiology Studies: VAS US  TRANSCRANIAL DOPPLER Result Date: 08/21/2024  Transcranial Doppler Patient Name:  SHONTELL PROSSER  Date of Exam:   08/20/2024 Medical Rec #: [id number]        Accession #:    [id number] Date of Birth: 11/27/1970        Patient Gender: F Patient Age:   51 years Exam Location:  [facility] Procedure:      VAS US  TRANSCRANIAL DOPPLER Referring Phys: PRAMOD SETHI --------------------------------------------------------------------------------  Indications: Stroke. Limitations: Patient movement and inability to turn head to the right for              visualization of the left transtemporal window Limitations for diagnostic windows: Unable to insonate left transtemporal window. Comparison Study: No prior study Performing Technologist: Alberta Lis RVS  Examination Guidelines: A complete evaluation includes B-mode imaging, spectral Doppler, color Doppler, and power Doppler as needed of all accessible portions of each vessel. Bilateral testing is considered an integral part of a complete examination. Limited examinations for reoccurring indications may be performed as noted.  +----------+---------------+----------+-----------+------------------+ RIGHT TCD Right VM (cm/s)Depth (cm)Pulsatility     Comment       +----------+---------------+----------+-----------+------------------+ MCA             87                    1.35                       +----------+---------------+----------+-----------+------------------+ ACA             -75                   1.45                       +----------+---------------+----------+-----------+------------------+ Term ICA                                       Unable to insonate +----------+---------------+----------+-----------+------------------+ PCA P1          -27  1.73                       +----------+---------------+----------+-----------+------------------+ Opthalmic       16                    1.29                       +----------+---------------+----------+-----------+------------------+ ICA siphon                                    Unable to insonate +----------+---------------+----------+-----------+------------------+ Vertebral       -37                   1.28                       +----------+---------------+----------+-----------+------------------+  +----------+--------------+----------+-----------+------------------+ LEFT TCD  Left VM (cm/s)Depth (cm)Pulsatility     Comment       +----------+--------------+----------+-----------+------------------+ MCA                                          Unable to insonate +----------+--------------+----------+-----------+------------------+ ACA                                          Unable to insonate +----------+--------------+----------+-----------+------------------+ Term ICA                                     Unable to insonate +----------+--------------+----------+-----------+------------------+ PCA P1                                       Unable to insonate +----------+--------------+----------+-----------+------------------+ Opthalmic       15                   1.23                       +----------+--------------+----------+-----------+------------------+ ICA siphon                                   Unable to insonate +----------+--------------+----------+-----------+------------------+ Vertebral      -30                   1.69                       +----------+--------------+----------+-----------+------------------+  +------------+-------+-------+             VM cm/sComment  +------------+-------+-------+ Prox Basilar  -56          +------------+-------+-------+ Summary:  Absent left temporal window limits evaluation of anterior circulation on the left. Mildly elevated right middle and anterior cerbral artery mean flow velocitie sof unclear significance. Globally elevated pulsatility indices suggests diffus eintracranial atherosclerosis likely. *See table(s) above for TCD measurements and observations.  Diagnosing physician: Eather Popp MD Electronically signed by Eather Popp MD on 08/21/2024 at 7:43:31 AM.  Final    VAS US  CAROTID Result Date: 08/21/2024 Carotid Arterial Duplex Study Patient Name:  JOCLYNN LUMB  Date of Exam:   08/20/2024 Medical Rec #: [id number]        Accession #:    [id number] Date of Birth: 30-Sep-1970        Patient Gender: F Patient Age:   13 years Exam Location:  [facility] Procedure:      VAS US  CAROTID Referring Phys: PRAMOD SETHI --------------------------------------------------------------------------------  Indications:       CVA. Risk Factors:      Hypertension, hyperlipidemia, current smoker, prior MI,                    coronary artery disease, PAD. Other Factors:     Bilateral AKA, HFrEF, CKD. Limitations        Today's exam was limited due to the patient's inability or                    unwillingness to cooperate/altered mental status. Patient                    would not keep head turned Comparison Study:  Prior carotid duplex done 09/22/23 indicating 1-39% right ICA                    stenosis and 40-59% left ICA stenosis Performing Technologist: Alberta Lis RVS  Examination Guidelines: A complete evaluation includes B-mode imaging, spectral Doppler, color Doppler, and power Doppler as needed of all accessible portions of each vessel. Bilateral testing is considered an integral part of a complete examination. Limited examinations for reoccurring indications may be performed as noted.  Right Carotid Findings:  +----------+--------+--------+--------+------------------+------------------+           PSV cm/sEDV cm/sStenosisPlaque DescriptionComments           +----------+--------+--------+--------+------------------+------------------+ CCA Prox  99      19                                intimal thickening +----------+--------+--------+--------+------------------+------------------+ CCA Distal81      19                                intimal thickening +----------+--------+--------+--------+------------------+------------------+ ICA Prox  144     37      1-39%   calcific                             +----------+--------+--------+--------+------------------+------------------+ ICA Mid   96      29                                                   +----------+--------+--------+--------+------------------+------------------+ ICA Distal126     36                                                   +----------+--------+--------+--------+------------------+------------------+ ECA       486     12      >50%  heterogenous                         +----------+--------+--------+--------+------------------+------------------+ +----------+--------+-------+--------+-------------------+           PSV cm/sEDV cmsDescribeArm Pressure (mmHG) +----------+--------+-------+--------+-------------------+ [id number]                                        +----------+--------+-------+--------+-------------------+ +---------+--------+--+--------+--+---------+ VertebralPSV cm/s36EDV cm/s11Antegrade +---------+--------+--+--------+--+---------+  Left Carotid Findings: +----------+--------+--------+--------+----------------------+---------+           PSV cm/sEDV cm/sStenosisPlaque Description    Comments  +----------+--------+--------+--------+----------------------+---------+ CCA Prox  174     35              heterogenous                     +----------+--------+--------+--------+----------------------+---------+ CCA Distal169     25              heterogenous                    +----------+--------+--------+--------+----------------------+---------+ ICA Prox  263     52      40-59%  irregular and calcificShadowing +----------+--------+--------+--------+----------------------+---------+ ICA Mid   118     29                                              +----------+--------+--------+--------+----------------------+---------+ ICA Distal128     33                                              +----------+--------+--------+--------+----------------------+---------+ ECA       275     25                                              +----------+--------+--------+--------+----------------------+---------+ +----------+--------+--------+--------+-------------------+           PSV cm/sEDV cm/sDescribeArm Pressure (mmHG) +----------+--------+--------+--------+-------------------+ Subclavian115                                         +----------+--------+--------+--------+-------------------+ +---------+--------+--------+------------+ VertebralPSV cm/sEDV cm/sNot assessed +---------+--------+--------+------------+   Summary: Right Carotid: Velocities in the right ICA are consistent with a 1-39% stenosis. Left Carotid: Velocities in the left ICA are consistent with a 40-59% stenosis. Vertebrals:  Right vertebral artery demonstrates antegrade flow. Left vertebral              not assessed. Subclavians: Normal flow hemodynamics were seen in bilateral subclavian              arteries. *See table(s) above for measurements and observations.  Electronically signed by Eather Popp MD on 08/21/2024 at 7:41:15 AM.    Final    US  RENAL Result Date: 08/20/2024 EXAM: RETROPERITONEAL ULTRASOUND OF THE KIDNEYS 08/20/2024 06:04:00 PM TECHNIQUE: Real-time ultrasonography of the retroperitoneum, specifically the kidneys and urinary  bladder, was performed. COMPARISON: US  Renal 10/14/2023. CLINICAL HISTORY: Acute on chronic renal failure. FINDINGS: RIGHT KIDNEY: Right kidney  measures 7.2 x 2.9 x 3.2 cm. Calculated volume is 35 ml. Diffuse atrophic changes are noted. No hydronephrosis. LEFT KIDNEY: Left kidney measures 11.0 x 4.7 x 4.2 cm. Calculated volume: 114 ml. Normal cortical echogenicity. No hydronephrosis. BLADDER: The bladder is decompressed by a foley catheter. IMPRESSION: 1. Right kidney with diffuse atrophic changes, measuring 7.2 x 2.9 x 3.2 cm (calculated volume 35 ml). 2. Left kidney measuring 11.0 x 4.7 x 4.2 cm (calculated volume 114 ml). Electronically signed by: Oneil Devonshire MD 08/20/2024 06:23 PM EST RP Workstation: [id number]    Scheduled Meds:  aspirin   81 mg Oral Daily   Chlorhexidine  Gluconate Cloth  6 each Topical Daily   clopidogrel   75 mg Oral Daily   feeding supplement  237 mL Oral TID BM   heparin   5,000 Units Subcutaneous Q8H   insulin  aspart  0-15 Units Subcutaneous TID WC   insulin  aspart  0-5 Units Subcutaneous QHS   [START ON 08/23/2024] insulin  glargine  12 Units Subcutaneous Daily   metoprolol  succinate  25 mg Oral Daily   multivitamin with minerals  1 tablet Oral Daily   rosuvastatin   40 mg Oral QPM   Continuous Infusions:     LOS: 3 days   Ivonne Mustache, MD [facility] P1/27/2026, 11:27 AM  "

## 2024-08-22 NOTE — Progress Notes (Signed)
 Nutrition Brief Note  RD received consult for supplement evaluation.  Full follow-up completed 1/26. At that time pt was started on: -Ensure Plus High Protein po TID, each supplement provides 350 kcal and 20 grams of protein.  -Daily MVI  Patient continues on dysphagia 3 diet with thin liquids. More alert today. Consumed 50% of breakfast this morning. Requires some feeding assistance.  Will add Magic cups to meals as well. Will monitor PO intakes.  Labs and medications reviewed. CBGs:71-95 Low potassium  If further nutrition issues arise, please consult RD.   Morna Lee, MS, RD, LDN Inpatient Clinical Dietitian Contact via Secure chat

## 2024-08-22 NOTE — Progress Notes (Signed)
 Occupational Therapy Treatment Patient Details Name: [name] MRN: [id number] DOB: [birth year] Today's Date: 08/22/2024   History of present illness 54 y.o. female presents to [facility] 08/19/24 from [facility] for evaluation of progressive renal failure. Pt also with AMS w/ L sided weakness and acute on chronic CHF exacerbation. MRI brain showed multiple acute infarcts in bilateral frontal, parietal and occipital lobes, right basal ganglia, left thalamus and left cerebellum, possibly embolic. PMHx: HFrEF (30-35%), PVCs, tobacco abuse, HTN, PAD, bilateral AKA, CKD III, HLD, PVD   OT comments  Pt presented in bed moaning but could not report what was hurting. She noted to have had loose BM and required total assist for peri care with mod assist to roll side to side to initiation and then was able to assist with holding onto bed rail. Pt then had another BM and required the same care and total assist to reposition in bed but appeared to be comfortable as was starting to fall asleep. Acute Occupational Therapy to follow.       If plan is discharge home, recommend the following:  Two people to help with bathing/dressing/bathroom;Two people to help with walking and/or transfers;Assistance with cooking/housework;Assist for transportation   Equipment Recommendations  Other (comment) (TBD at next venue)    Recommendations for Other Services Rehab consult    Precautions / Restrictions Precautions Precautions: Fall Recall of Precautions/Restrictions: Impaired Precaution/Restrictions Comments: NG tube, chronic B AKAs Restrictions Weight Bearing Restrictions Per Provider Order: No       Mobility Bed Mobility Overal bed mobility: Needs Assistance Bed Mobility: Rolling Rolling: Mod assist, Used rails         General bed mobility comments: rolled several times R and L side with mod assist to intiate but then was able to hold onto rail for periods of time    Transfers                    General transfer comment: deffered due to further bms     Balance                                           ADL either performed or assessed with clinical judgement   ADL Overall ADL's : Needs assistance/impaired   Eating/Feeding Details (indicate cue type and reason): pt no longer appeared to be intrested in self ffeding Grooming: Wash/dry face;Moderate assistance;Bed level       Lower Body Bathing: Total assistance;Bed level       Lower Body Dressing: Total assistance;Bed level       Toileting- Clothing Manipulation and Hygiene: Maximal assistance;Bed level              Extremity/Trunk Assessment Upper Extremity Assessment Upper Extremity Assessment: Generalized weakness;Difficult to assess due to impaired cognition (Pt was able to reach out to B rails with rolling side to side and then also was holding onto fork in session but not clear if was able to self feed self prior to enter the room)            Vision   Vision Assessment?: Vision impaired- to be further tested in functional context   Perception Perception Perception:  (difficult to assess due to cognition)   Praxis     Communication Communication Communication: Impaired Factors Affecting Communication: Difficulty expressing self   Cognition Arousal: Alert, Lethargic Behavior During Therapy: Flat affect  Cognition: History of cognitive impairments             OT - Cognition Comments: Pt noted to be able to report name and family member name in session.                 Following commands: Impaired Following commands impaired: Follows one step commands inconsistently (needs increase in time)      Cueing   Cueing Techniques: Gestural cues, Verbal cues, Tactile cues  Exercises      Shoulder Instructions       General Comments      Pertinent Vitals/ Pain       Pain Assessment Pain Assessment: Faces Faces Pain Scale: Hurts a little bit Breathing:  normal Negative Vocalization: occasional moan/groan, low speech, negative/disapproving quality Facial Expression: smiling or inexpressive Body Language: relaxed Consolability: no need to console PAINAD Score: 1 Pain Location: generalized, could not vocalize why but maybe BM? Pain Descriptors / Indicators: Discomfort Pain Intervention(s): Monitored during session  Home Living                                          Prior Functioning/Environment              Frequency  Min 2X/week        Progress Toward Goals  OT Goals(current goals can now be found in the care plan section)  Progress towards OT goals: Progressing toward goals  Acute Rehab OT Goals Patient Stated Goal: none OT Goal Formulation: Patient unable to participate in goal setting Time For Goal Achievement: 09/03/24 Potential to Achieve Goals: Good ADL Goals Pt Will Perform Eating: sitting;with contact guard assist Pt Will Perform Grooming: with contact guard assist;sitting Pt Will Perform Upper Body Bathing: sitting;with contact guard assist Pt Will Perform Lower Body Bathing: with min assist;sitting/lateral leans  Plan      Co-evaluation    PT/OT/SLP Co-Evaluation/Treatment: Yes Reason for Co-Treatment: Complexity of the patient's impairments (multi-system involvement);Necessary to address cognition/behavior during functional activity;For patient/therapist safety;To address functional/ADL transfers   OT goals addressed during session: ADL's and self-care      AM-PAC OT 6 Clicks Daily Activity     Outcome Measure   Help from another person eating meals?: A Little Help from another person taking care of personal grooming?: A Lot Help from another person toileting, which includes using toliet, bedpan, or urinal?: Total Help from another person bathing (including washing, rinsing, drying)?: A Lot Help from another person to put on and taking off regular upper body clothing?: A  Lot Help from another person to put on and taking off regular lower body clothing?: Total 6 Click Score: 11    End of Session    OT Visit Diagnosis: Unsteadiness on feet (R26.81);Other abnormalities of gait and mobility (R26.89);Muscle weakness (generalized) (M62.81);Cognitive communication deficit (R41.841);Other symptoms and signs involving cognitive function Symptoms and signs involving cognitive functions: Cerebral infarction   Activity Tolerance Other (comment) (bms)   Patient Left in bed;with call bell/phone within reach;with bed alarm set   Nurse Communication Mobility status (bms)        Time: 9096-9075 OT Time Calculation (min): 21 min  Charges: OT General Charges $OT Visit: 1 Visit OT Treatments $Self Care/Home Management : 8-22 mins  Warrick POUR OTR/L  Acute Rehab Services  [phone] office number   Warrick Berber 08/22/2024, 9:55 AM

## 2024-08-22 NOTE — Inpatient Diabetes Management (Signed)
 Inpatient Diabetes Program Recommendations  AACE/ADA: New Consensus Statement on Inpatient Glycemic Control  Target Ranges:  Prepandial:   less than 140 mg/dL      Peak postprandial:   less than 180 mg/dL (1-2 hours)      Critically ill patients:  140 - 180 mg/dL   Lab Results  Component Value Date   GLUCAP 71 08/22/2024   HGBA1C 7.6 (H) 08/21/2023    Latest Reference Range & Units 08/21/24 07:37 08/21/24 11:34 08/21/24 16:02 08/21/24 20:50 08/21/24 23:58 08/22/24 03:59 08/22/24 07:43  Glucose-Capillary 70 - 99 mg/dL 831 (H) 813 (H) 824 (H) 67 (L) 79 74 71    Review of Glycemic Control  Diabetes history: DM2  Outpatient Diabetes medications:  Lantus  40 units BID  Jardiance  25mg  daily   Current orders for Inpatient glycemic control:  Lantus  15 units daily  Novolog  0-15 units TID + 0-5 units at bedtime   Inpatient Diabetes Program Recommendations:   Noted mild Hypoglycemia yesterday @ 2050 and CBG was 71mg /dL this morning.   Please consider:  - Decrease Lantus  to 12 units daily   Thanks,  Lavanda Search, RN, MSN, [facility]  Inpatient Diabetes Coordinator  Pager [phone] (8a-5p)

## 2024-08-22 NOTE — Progress Notes (Addendum)
 "  Progress Note  Patient Name: [name] Date of Encounter: 08/22/2024 [facility] Cardiologist: Jayson Sierras, MD   Interval Summary   Somnolent though does awake when spoken to, though promptly would fall back asleep. Mumbled answers to questions  Vital Signs Vitals:   08/21/24 1228 08/21/24 1959 08/21/24 2317 08/22/24 0353  BP: (!) 159/63 (!) 161/66 (!) 161/60 (!) 157/70  Pulse: 77 77 68 74  Resp:  16 18 18   Temp:  97.6 F (36.4 C) 98.7 F (37.1 C) 98.8 F (37.1 C)  TempSrc:  Oral Oral Oral  SpO2: 97% 95% 96% 96%  Weight:    (P) 62.9 kg  Height:        Intake/Output Summary (Last 24 hours) at 08/22/2024 0732 Last data filed at 08/22/2024 0356 Gross per 24 hour  Intake 240 ml  Output 950 ml  Net -710 ml      08/22/2024    3:53 AM 08/21/2024    4:23 AM 08/20/2024    4:52 AM  Last 3 Weights  Weight (lbs) 138 lb 10.7 oz 137 lb 2 oz 132 lb 4.4 oz  Weight (kg) 62.9 kg 62.2 kg 60 kg      Telemetry/ECG  Sinus rhythm with frequent PVCs, several episodes of 3 beat NVST HR ~70 - Personally Reviewed  Physical Exam  GEN: No acute distress.   Neck: mild JVD Cardiac: Irregularly, irregular, 1-2/6 systolic murmur Respiratory: right side diminished though no adventitious sound Skin: warm and dry EXT: bilateral BKA  Assessment & Plan  54 year old woman with a history of heart failure with reduced ejection fraction, low-flow low gradient aortic stenosis (probably mild or moderate), moderate mitral insufficiency, history of high burden of PVCs (unable to perform ablation due to lack of PVCs during the procedure), chronic kidney disease stage III, hyperlipidemia, uncontrolled DM type II, history of COPD, tobacco use and pulmonary embolism, PAD with bilateral above-the-knee amputations who had been admitted at [facility] 1/20 with acute on chronic HF exacerbation with cardiorenal syndrome and stroke like symptoms. She was transferred to [facility] for further evaluation.     Head imaging showing multiple infarcts throughout the brain, suspected to be cardio-embolic. Neurology following. AKI on CKD, possible over diuresis as patient was euvolemic-dry on arrival. Nephrology signed off yesterday.    Patient's family transitioned patient to DNR and comfort care, though comfort care has now been cancelled as patient's responsiveness is improving. DNR remains. Palliative following.    Acute on chronic HFrEF AKI on CKD Myoview  at time of diagnosis was WNL, though she is suspected to have underlying CAD with her hx Etiology possibly mixed On admission at [facility], ProBNP > 70,000.  Did receive IV diuresis though with worsening renal function this was stopped. Renal function has continued to improve off diuresis with Cr today 2.00. Nephrology has signed off.    Echo this admission showed EF improvement to 50-55% with possible RWMA in RCA distrubution. Invasive coronary evaluation not recommended at this time with her acute CVA and AKI on CKD. Can re-address as clinical picture continues to change.    Net IO Since Admission: -1,700.82 mL [08/22/24 0733] On exam, appears volume up, though also has severe hypoalbuminemia. Will hold on CXR for now as she is not requiring oxygen.    Will hold on diuresis as renal function is continuing to improve off diuresis  GDMT on hold with AKI on CKD, neurology SBP goal < 160 Will start metoprolol  succinate 25 mg  Frequent ectopy Seen by EP previously for high PVC burden, attempted PVC ablation though was aborted given very low frequency of PVCs at the time, including with isoproterenol .  Telemetry also showing frequent PVCs and brief episodes of NSVT [3 beats] Keep K > 4 and Mag > 2.    BB as above   Hypertension BP: 180/77 Neurology following, SBP goal < 160 BB as above   Severe PAD s/p bilateral BKA  CAD Continue ASA 81 mg Continue plavix  75 mg   Tobacco use NRT per primary   Hyperlipidemia LDL 119   HDL  36   Currently above goal, LDL goal < 55 Consider outpatient lipid clinic referral.  Continue crestor  40 mg   Elevated Troponin 70 -> 75 -> 128 Though RWMA noted on echocardiogram, troponin elevation suspected to be 2/2 demand ischemia given trend. Coronary evaluation on hold as above. As she clinically improves can reconsider coronary evaluation.   CVA Will continue to follow telemetry for AF, pending disposition and GOC could apply 30 day monitor for further evaluation. Could consider TEE as well though would also need to consider in context of situation with her current acute state. TEE unlikely to change current management, though patient is becoming more alert, continue to re-assess.  Neurology following   VHD AI and MR improved when compared to prior echocardiogram, continue to follow outpatient.  Hypokalemia K today 3.0, repletion ordered   Per primary T2DM [A1c 10.1%] CVA AKI on CKD Anemia of chronic disease GOC     For questions or updates, please contact [facility] Please consult [email or web address] for contact info under       Signed, Leontine LOISE Salen, PA-C   "

## 2024-08-22 NOTE — Progress Notes (Signed)
 Sharon Buttery, MD notified about patient pulling at telemetry leads and wires. RN ordered to remove telemetry from patient and d/c order.

## 2024-08-22 NOTE — Progress Notes (Signed)
 STROKE TEAM PROGRESS NOTE    INTERIM HISTORY/SUBJECTIVE No family at the bedside.  Patient laying in the bed in no apparent distress. She is more interactive today and speaks a few words but not sentences.  Follows occasional commands.  Moves all 4 extremities with right greater than left. CBC    Component Value Date/Time   WBC 8.9 08/21/2024 0257   RBC 3.61 (L) 08/21/2024 0257   HGB 10.8 (L) 08/21/2024 0257   HGB 13.4 01/17/2024 1024   HCT 33.9 (L) 08/21/2024 0257   HCT 42.3 01/17/2024 1024   PLT 137 (L) 08/21/2024 0257   PLT 253 01/17/2024 1024   MCV 93.9 08/21/2024 0257   MCV 97 01/17/2024 1024   MCH 29.9 08/21/2024 0257   MCHC 31.9 08/21/2024 0257   RDW 15.9 (H) 08/21/2024 0257   RDW 13.2 01/17/2024 1024   LYMPHSABS 1.6 08/19/2024 1839   MONOABS 1.1 (H) 08/19/2024 1839   EOSABS 0.0 08/19/2024 1839   BASOSABS 0.0 08/19/2024 1839    BMET    Component Value Date/Time   NA 146 (H) 08/22/2024 0430   NA 132 (L) 01/17/2024 1025   K 3.0 (L) 08/22/2024 0430   CL 103 08/22/2024 0430   CO2 33 (H) 08/22/2024 0430   GLUCOSE 77 08/22/2024 0430   BUN 62 (H) 08/22/2024 0430   BUN 33 (H) 01/17/2024 1025   CREATININE 2.00 (H) 08/22/2024 0430   CREATININE 1.09 02/21/2014 1729   CALCIUM  7.5 (L) 08/22/2024 0430   EGFR 31 (L) 01/17/2024 1025   GFRNONAA 29 (L) 08/22/2024 0430    IMAGING past 24 hours No results found.   Vitals:   08/21/24 2317 08/22/24 0353 08/22/24 0743 08/22/24 1152  BP: (!) 161/60 (!) 157/70 (!) 180/77 (!) 153/71  Pulse: 68 74 75 77  Resp: 18 18 18 19   Temp: 98.7 F (37.1 C) 98.8 F (37.1 C) 98.5 F (36.9 C) 98.1 F (36.7 C)  TempSrc: Oral Oral Oral Oral  SpO2: 96% 96% 97% 96%  Weight:  (P) 62.9 kg    Height:         PHYSICAL EXAM General: Chronically ill female Psych:  Mood and affect appropriate for situation CV: Regular rate and rhythm on monitor Respiratory:  Regular, unlabored respirations on room air GI: Abdomen soft and  nontender Musculoskeletal bilateral below-knee amputation   NEURO:  Mental Status: She is awake alert oriented to self with slowed responses.    Expressive greater than receptive aphasia and speaks only a few words occasional short sentences.  Follows simple midline commands on the Cranial Nerves:  II: PERRL. Visual fields full.  Tracking III, IV, VI: EOMI. Eyelids elevate symmetrically.  V: Sensation is intact to light touch and symmetrical to face.  VII: Face is symmetrical resting and smiling VIII: hearing intact to voice. IX, X: Palate elevates symmetrically. Phonation is normal.  KP:Dynloizm shrug 5/5. XII: tongue is midline without fasciculations. Motor: Generalized weakness, bilateral upper extremities able to hold against gravity with drift but equal, bilateral AKA but can move stumps, questionable whether left stump moves less than right Tone: is normal and bulk is normal Sensation- Intact to light touch bilaterally. Extinction absent to light touch to DSS.   Coordination: FTN intact bilaterally, HKS: no ataxia in BLE.No drift.  Gait- deferred    ASSESSMENT/PLAN  [name] is a 54 y.o. female with history of  hypertension, diabetes, heart failure with EF 35 to 40%, PAD status post AKA, history of PE,  anemia who was transferred from [facility] due to renal failure, confusion, and concern for stroke.  She became less responsive on 1/21 and was not moving the left arm and therefore a code stroke was activated and a CT head showed right caudate infarct.  She was briefly made comfort care, but had an improving mental status and therefore has been transferred for further care.  NIH on Admission 6  Acute Ischemic Infarct: Scattered bilateral infarcts in frontal, parietal and occipital lobes as well as the right basal ganglia, left thalamus and left cerebellum Etiology: Likely cardioembolic MRI  Significantly motion limited and incomplete study with many acute infarcts in  bilateral frontal, parietal and occipital lobes, right basal ganglia, left thalamus and left cerebellum. Consider embolic etiology. Carotid Doppler 40-59% left carotid stenosis.  1-39% right carotid stenosis TCD   absent left temporal windows.  Elevated pulsatility in dicey suggest diffuse intracranial atherosclerosis. 2D Echo EF 50 to 55%.  LV moderately to severely dilated Will need either a 30-day heart monitor or loop recorder depending on patient improvement LDL 119 HgbA1c 7.6 VTE prophylaxis -heparin  subcu aspirin  81 mg daily and clopidogrel  75 mg daily prior to admission, now on aspirin  81 mg daily and clopidogrel  75 mg daily Therapy recommendations:  Pending Disposition: Pending  Hypertension CHF CAD Carotid artery stenosis Home meds: Lasix  40 mg, propranolol  120 mg Stable Blood Pressure Goal: SBP less than 160   Hyperlipidemia Home meds: Crestor  40 mg, resumed in hospital LDL 119, goal < 70 Consider switching Crestor  to atorvastatin  80 mg Continue statin at discharge  Diabetes type II UnControlled Home meds: Jardiance , insulin  HgbA1c 7.6, goal < 7.0 CBGs SSI Recommend close follow-up with PCP for better DM control  Dysphagia Patient has post-stroke dysphagia, SLP consulted    Diet   DIET DYS 3 Room service appropriate? Yes with Assist; Fluid consistency: Thin   Advance diet as tolerated  Other Stroke Risk Factors Obesity, Body mass index is 21.08 kg/m (pended)., BMI >/= 30 associated with increased stroke risk, recommend weight loss, diet and exercise as appropriate  Coronary artery disease Congestive heart failure   Other Active Problems CKD stage IIIb PAD S/P bilateral AKA Anxiety and depression COPD Vitamin D deficiency Vitamin B12 deficiency Fatty liver  Hospital day # 3    Patient presents with confusion and MRI scan was bilateral small embolic infarcts maintain posterior circulation.   Continue cardiac monitoring and may will likely need  outpatient monitoring to look for paroxysmal A-fib..   Continue aspirin  and Plavix  for now.  Neurological exam shows slight improvement but still has expressive greater than receptive aphasia and mild hemiparesis.  Continue ongoing therapies.  Patient is a DNR.  Palliative care team has been consulted for goals of care discussion with family.  If patient does not improve significantly may consider comfort care if she gets worse.  No family at the bedside.  Stroke team will sign off.  Kindly call for questions.   e.         Eather Popp, MD Medical Director [facility] Pager: [phone] 08/22/2024 11:53 AM   To contact Stroke Continuity provider, please refer to [email or web address]. After hours, contact General Neurology

## 2024-08-22 NOTE — Progress Notes (Signed)
 "  Palliative Medicine Inpatient Follow Up Note  HPI:  [name] is a 54 year old female with a past medical history significant for heart failure, peripheral arterial disease requiring bilateral above-the-knee amputations, tobacco abuse, anxiety, arthritis, depression, GERD, hypertension, and type 2 diabetes mellitus. [name] was admitted to [facility] from [facility] on January 24 in the setting of progressive renal failure. Palliative care has been asked to support additional goals of care conversations.   Today's Discussion 08/22/2024  I reviewed the chart notes including nursing notes from Dania Lunger, progress notes from Leontine Salen, [place] - Cardiology, Warrick Berber - OT, Lavanda Search - Diabetes RN. I also reviewed vital signs - elevated SBP this morning nursing flowsheets - have requested nursing document oral intake, medication administrations record, labs from today inclusive of BMP- improved Cr of 2; no imaging within the last 24 hours to review.   I met with [name] at bedside this morning. She is awake and alert to person and place but not situation. She denies pain this morning. She is eating breakfast at the time of assessment though seems to be having trouble with her L hand. I was able to set up her tray and shared with staff that she needs 1:1 feeding assistance.   I called and spoke with patients sister, [name]. She shares that she is stuck in snow still and will not be here today. I provided a brief update on patients clinical condition. We reviewed the differences between Acute and SNF rehab as below:  Acute rehab is intense rehab for patients who have experienced a major medical trauma and need serious efforts to aid in recovery. Some patients may have had a stroke, just come out of major surgery, had an amputation, or may still be dealing with a serious illness. Personalized multidisciplinary therapy scheduled throughout the day on average for three to four  hours, five to seven days a week, to improve function and independence.   Skilled care is nursing and therapy care that can only be safely and effectively performed by, or under the supervision of, professionals or engineer, maintenance. Its health care given when you need skilled nursing or skilled therapy to treat, manage, and observe your condition, and evaluate your care. In a skilled nursing facility you'll receive one or more therapies for an average of one to two hours per day five days a week. This includes physical, occupational, and speech therapy. The therapies are not considered intensive.   Patients sister is hopeful that Braelee is accepted to acute rehabilitation to be optimized as best as able.   Questions and concerns addressed/Palliative Support Provided.   Objective Assessment: Vital Signs Vitals:   08/22/24 0353 08/22/24 0743  BP: (!) 157/70 (!) 180/77  Pulse: 74 75  Resp: 18 18  Temp: 98.8 F (37.1 C) 98.5 F (36.9 C)  SpO2: 96% 97%    Intake/Output Summary (Last 24 hours) at 08/22/2024 1147 Last data filed at 08/22/2024 0356 Gross per 24 hour  Intake 240 ml  Output 550 ml  Net -310 ml   Last Weight  Most recent update: 08/22/2024  4:01 AM    Weight  (P)  62.9 kg (138 lb 10.7 oz)            Gen: Middle-age Caucasian female chronically ill in appearance HEENT: moist mucous membranes CV: Regular rate and rhythm. PULM: On room air breathing is even and nonlabored ABD: soft/nontender  EXT: Bilateral AKA's Neuro: Aware of self and place,  expressive aphasia  SUMMARY OF RECOMMENDATIONS   DNAR/DNI  Continue current care allowing time for outcomes   Patient showing signs of improvements - has passed swallow evaluation, PT/OT working with her today  Patients family aware of potential outcomes if patient were to decline and how this may involve transition in care focus  CIR evaluating for possible admission  Ongoing palliative medicine team  support ______________________________________________________________________________________ [name] Palliative Medicine Team Team Cell Phone: [phone] Please utilize secure chat with additional questions, if there is no response within 30 minutes please call the above phone number  I personally spent a total of 35 minutes in the care of the patient today including preparing to see the patient, getting/reviewing separately obtained history, performing a medically appropriate exam/evaluation, counseling and educating, placing orders, documenting clinical information in the EHR, and coordinating care.     "

## 2024-08-23 DIAGNOSIS — J9601 Acute respiratory failure with hypoxia: Secondary | ICD-10-CM | POA: Diagnosis not present

## 2024-08-23 LAB — GLUCOSE, CAPILLARY
Glucose-Capillary: 122 mg/dL — ABNORMAL HIGH (ref 70–99)
Glucose-Capillary: 163 mg/dL — ABNORMAL HIGH (ref 70–99)
Glucose-Capillary: 268 mg/dL — ABNORMAL HIGH (ref 70–99)
Glucose-Capillary: 345 mg/dL — ABNORMAL HIGH (ref 70–99)
Glucose-Capillary: 293 mg/dL — ABNORMAL HIGH (ref 70–99)

## 2024-08-23 LAB — BASIC METABOLIC PANEL WITH GFR
Anion gap: 8 (ref 5–15)
BUN: 51 mg/dL — ABNORMAL HIGH (ref 6–20)
CO2: 30 mmol/L (ref 22–32)
Calcium: 7.7 mg/dL — ABNORMAL LOW (ref 8.9–10.3)
Chloride: 103 mmol/L (ref 98–111)
Creatinine, Ser: 1.72 mg/dL — ABNORMAL HIGH (ref 0.44–1.00)
GFR, Estimated: 35 mL/min — ABNORMAL LOW
Glucose, Bld: 86 mg/dL (ref 70–99)
Potassium: 3.8 mmol/L (ref 3.5–5.1)
Sodium: 142 mmol/L (ref 135–145)

## 2024-08-23 MED ORDER — EZETIMIBE 10 MG PO TABS
10.0000 mg | ORAL_TABLET | Freq: Every day | ORAL | Status: AC
  Administered 2024-08-23 – 2024-09-01 (×10): 10 mg via ORAL
  Filled 2024-08-23 (×10): qty 1

## 2024-08-23 MED ORDER — OXYCODONE HCL 5 MG PO TABS
5.0000 mg | ORAL_TABLET | Freq: Four times a day (QID) | ORAL | Status: DC | PRN
  Administered 2024-08-23 – 2024-08-29 (×12): 5 mg via ORAL
  Filled 2024-08-23 (×12): qty 1

## 2024-08-23 MED ORDER — CARVEDILOL 3.125 MG PO TABS
3.1250 mg | ORAL_TABLET | Freq: Two times a day (BID) | ORAL | Status: AC
  Administered 2024-08-23 – 2024-09-01 (×20): 3.125 mg via ORAL
  Filled 2024-08-23 (×21): qty 1

## 2024-08-23 NOTE — Progress Notes (Signed)
 Physical Therapy Treatment Patient Details Name: [name] MRN: [id number] DOB: [birth year] Today's Date: 08/23/2024   History of Present Illness 54 y.o. female presents to [facility] 08/19/24 from [facility] for evaluation of progressive renal failure. Pt also with AMS w/ L sided weakness and acute on chronic CHF exacerbation. MRI brain showed multiple acute infarcts in bilateral frontal, parietal and occipital lobes, right basal ganglia, left thalamus and left cerebellum, possibly embolic. PMHx: HFrEF (30-35%), PVCs, tobacco abuse, HTN, PAD, bilateral AKA, CKD III, HLD, PVD   PT Comments  Pt received in supine and agreeable to PT session. Worked on seated balance/core strength and lateral scoot transfers in today's session. Pt continues to require up to MaxAx2 for bed mobility with limited initiation. With initial transfer to seated position, pt had a heavy posterior lean and was pushing hips off EOB. Required repositioning in supine with improvements seen when sitting up on the left side of the bed. Able to then maintain seated position with MinA/CGA and BUE. Required TotalAx2 to perform lateral scoots towards [facility] with use of bed pad. Continue to recommend >3hrs post acute rehab with acute PT to follow.    If plan is discharge home, recommend the following: A lot of help with walking and/or transfers;A lot of help with bathing/dressing/bathroom;Assist for transportation;Help with stairs or ramp for entrance;Assistance with cooking/housework   Can travel by private vehicle      No  Equipment Recommendations  Hoyer lift       Precautions / Restrictions Precautions Precautions: Fall;Other (comment) Recall of Precautions/Restrictions: Impaired Precaution/Restrictions Comments: foley, h/o bilat AKAs Restrictions Weight Bearing Restrictions Per Provider Order: No     Mobility  Bed Mobility Overal bed mobility: Needs Assistance Bed Mobility: Rolling, Sidelying to Sit, Sit to  Sidelying Rolling: Max assist, Used rails Sidelying to sit: Max assist, +2 for physical assistance, +2 for safety/equipment, Used rails    Sit to sidelying: Max assist General bed mobility comments: cues to look towards the bed rail with hand over hand assist needed to reach. MaxAx2 to raise trunk    Transfers Overall transfer level: Needs assistance Equipment used: None Transfers: Bed to chair/wheelchair/BSC     Lateral/Scoot Transfers: Total assist, +2 physical assistance, +2 safety/equipment General transfer comment: multiple lateral scoots towards HOB with TotalAx2 and use of bed pad      Modified Rankin (Stroke Patients Only) Modified Rankin (Stroke Patients Only) Pre-Morbid Rankin Score: Slight disability Modified Rankin: Severe disability     Balance Overall balance assessment: Needs assistance Sitting-balance support: Bilateral upper extremity supported, Feet unsupported Sitting balance-Leahy Scale: Poor Sitting balance - Comments: Initially TotalA, after repositioning pt progressed to MinA/CGA Postural control: Posterior lean        Communication Communication Communication: Impaired Factors Affecting Communication: Difficulty expressing self  Cognition Arousal: Alert Behavior During Therapy: Flat affect   PT - Cognitive impairments: No family/caregiver present to determine baseline, Difficult to assess, Orientation, Memory, Awareness, Attention, Initiation, Sequencing, Problem solving, Safety/Judgement Difficult to assess due to: Impaired communication Orientation impairments: Place, Time, Situation    PT - Cognition Comments: Difficulty expressing needs/emotions. Would moan with mobility with frequent check ins needed Following commands: Impaired Following commands impaired: Follows one step commands inconsistently, Follows one step commands with increased time    Cueing Cueing Techniques: Gestural cues, Verbal cues, Tactile cues  Exercises General  Exercises - Lower Extremity Straight Leg Raises: AROM, Left, 5 reps, Supine Other Exercises Other Exercises: Bx3 lateral leans to elbow with push into  midline- MaxAx2 Other Exercises: x5 anterior trunk pulls with BUE        Pertinent Vitals/Pain Pain Assessment Pain Assessment: Faces Faces Pain Scale: Hurts even more Pain Location: R side and bottom Pain Descriptors / Indicators: Discomfort, Moaning Pain Intervention(s): Limited activity within patient's tolerance, Monitored during session, Repositioned     PT Goals (current goals can now be found in the care plan section) Acute Rehab PT Goals PT Goal Formulation: Patient unable to participate in goal setting Time For Goal Achievement: 09/03/24 Potential to Achieve Goals: Fair Progress towards PT goals: Progressing toward goals    Frequency    Min 2X/week       AM-PAC PT 6 Clicks Mobility   Outcome Measure  Help needed turning from your back to your side while in a flat bed without using bedrails?: A Lot Help needed moving from lying on your back to sitting on the side of a flat bed without using bedrails?: Total Help needed moving to and from a bed to a chair (including a wheelchair)?: Total Help needed standing up from a chair using your arms (e.g., wheelchair or bedside chair)?: Total Help needed to walk in hospital room?: Total Help needed climbing 3-5 steps with a railing? : Total 6 Click Score: 7    End of Session   Activity Tolerance: Patient limited by fatigue Patient left: in bed;with call bell/phone within reach;with nursing/sitter in room Nurse Communication: Mobility status PT Visit Diagnosis: Unsteadiness on feet (R26.81);Other abnormalities of gait and mobility (R26.89);Muscle weakness (generalized) (M62.81);Hemiplegia and hemiparesis Hemiplegia - Right/Left: Left Hemiplegia - caused by: Cerebral infarction     Time: 1335-1404 PT Time Calculation (min) (ACUTE ONLY): 29 min  Charges:     $Therapeutic Exercise: 8-22 mins $Therapeutic Activity: 8-22 mins PT General Charges $$ ACUTE PT VISIT: 1 Visit                    Kate ORN, PT, DPT Secure Chat Preferred  Rehab Office [phone]   Kate BRAVO Wendolyn 08/23/2024, 2:31 PM

## 2024-08-23 NOTE — Progress Notes (Signed)
 " PROGRESS NOTE  [name]  FMW:[id number] DOB: 01-31-1971 DOA: 08/19/2024 PCP: Jeanette Comer BRAVO, PA-C   Brief Narrative: Patient is a 54 year old female with history of HFrEF, PVCs, tobacco use, hypertension, peripheral artery disease, bilateral AKA, CKD stage IIIb, hyperlipidemia who was transferred from [facility] for  further management of progressive renal failure.  She was initially admitted there on 1/20 and was currently being managed for acute on chronic CHF exacerbation, AMS, left-sided weakness, concern for stroke, progressive renal failure, elevated troponin.  On presentation to there, she was confused, code stroke was called.  CT head showed focal hypodensity in the right caudate body and adjacent white matter concerning for stroke.  She continued to decline and was requested to transfer here.  MRI showed acute ischemic stroke.  Workup completed.  Nephrology, neurology, cardiology, palliative care were following.  PT recommending AIR on discharge.  Has clinically improved significantly.  Can be discharged to acute inpatient rehab when possible  Assessment & Plan:  Principal Problem:   Acute hypoxemic respiratory failure (HCC) Active Problems:   Acute HFrEF (heart failure with reduced ejection fraction) (HCC)   Cerebrovascular accident (CVA) due to embolism of cerebral artery (HCC)   Status post below-knee amputation of both lower extremities (HCC)  Acute ischemic stroke: MRI showed scattered infarcts including bilateral frontal, parietal, occipital, basal ganglia, left thalamus and cerebellum.  Suspected embolic phenomena.  Neurology following.  Currently on aspirin , statin, Plavix , PT/OT/speech consulted.  PT recommended AIR.  TEE deferred for now. She has global weakness.  Obeys commands today.  Alert and awake.  Has expressive aphasia/dysarthria  Altered mentation/dysphagia: Suspected to be from a stroke versus seizure.  Was started on Keppra at [facility], now not  on keppra Mentation has improved.  Currently alert and awake, oriented to place.  Has expressive aphasia.  On dysphagia 3 diet  Acute on chronic HFrEF: Echo done on 08/07/2024 showed EF of 35 to 40%.  Repeat echo done here showed EF of 50 to 55%, low normal left ventricular function, severely dilated left ventricular cavity, eccentric left ventricular hypertrophy, mild mitral valve regurgitation .cardiology already consulted and following.  BNP was elevated.  Currently appears euvolemic.  Earlier on Lasix , Aldactone, Entresto , currently on hold due to worsening renal function.  Started on metoprolol   AKI in CKD stage IIIb/AGMA: Creatinine was elevated up to 4.2 now better than baseline.  Looks like her baseline creatinine is around 2.3.  Nephrology signed off  Hypertension: Remains hypertensive today.  On  coreg .  Continue as needed medications for severe hypertension  History of coronary artery disease/elevated troponin: Troponin elevation likely from worsening renal function and heart failure.  On aspirin , Plavix , statin.  Cardiology planning for further evaluation of her coronary arteries after she clinically improves  History of peripheral artery disease/carotid artery stenosis: Status post bilateral BKA  Uncontrolled diabetes type 2: Last A1c noted to be 10.1.  Currently on sliding scale.  Monitor blood sugars.  Diabetic coordinator consulted.  She takes insulin  at home.  Restarted Lantus  at 12 units,Daily. Continue to  adjust dose as appropriate.  Anemia of chronic disease: In the setting of CKD.  Currently hemoglobin stable  Hyperlipidemia: On statin,zetia .  LDL of 119  Goals of care: Patient with multiple comorbidities now with altered mentation, significant acute ischemic stroke, renal failure.  CODE STATUS DNR/DNI.  Palliative care was consulted for goals of care.  She has bilateral AKA.  Poor quality of life. If her mentation does not  improve, she may be a candidate for comfort care.   Sister [name] understands this plan and anticipates improvement before discussing about comfort care/hospice options.  Patient is clinically improving now  Wound 08/19/24 Pressure Injury Sacrum Deep Tissue Pressure Injury - Purple or maroon localized area of discolored intact skin or blood-filled blister due to damage of underlying soft tissue from pressure and/or shear. (Active)      Nutrition Problem: Inadequate oral intake Etiology: dysphagia Wound 08/19/24 Pressure Injury Sacrum Deep Tissue Pressure Injury - Purple or maroon localized area of discolored intact skin or blood-filled blister due to damage of underlying soft tissue from pressure and/or shear. (Active)    DVT prophylaxis:heparin  injection 5,000 Units Start: 08/19/24 2200 SCDs Start: 08/19/24 1749     Code Status: Limited: Do not attempt resuscitation (DNR) -DNR-LIMITED -Do Not Intubate/DNI   Family Communication:Called and discussed with sister [name] on phone on 1/26  Patient status:Inpatient  Patient is from :home  Anticipated discharge to: Acute inpatient rehab  Estimated DC date: Whenever possible   Consultants: Cardiology, nephrology, neurology  Procedures: None yet  Antimicrobials:  Anti-infectives (From admission, onward)    None       Subjective: Patient seen and examined at bedside today.  Hemodynamically stable.  Overall comfortable.  Being fed today.  Not in any kind of distress.  Same as yesterday.  Moving her bilateral upper extremities.  Has expressive aphasia, remains confused but not agitated.  Objective: Vitals:   08/22/24 1920 08/23/24 0014 08/23/24 0415 08/23/24 0844  BP: (!) 151/58 (!) 159/85 (!) 149/57 (!) 160/58  Pulse:  79 70   Resp: 18 18 17 16   Temp: 98.3 F (36.8 C) 99.1 F (37.3 C) 98.9 F (37.2 C) 98.7 F (37.1 C)  TempSrc: Oral Axillary Axillary Oral  SpO2: 96%  96%   Weight:   63.1 kg   Height:        Intake/Output Summary (Last 24 hours) at 08/23/2024  1022 Last data filed at 08/23/2024 0415 Gross per 24 hour  Intake 720 ml  Output 400 ml  Net 320 ml   Filed Weights   08/21/24 0423 08/22/24 0353 08/23/24 0415  Weight: 62.2 kg (P) 62.9 kg 63.1 kg    Examination:  General exam: Overall comfortable, not in distress, chronically deconditioned HEENT: PERRL Respiratory system:  no wheezes or crackles  Cardiovascular system: S1 & S2 heard, RRR.  Gastrointestinal system: Abdomen is nondistended, soft and nontender. Central nervous system: Alert and awake but not sure if she is oriented.  Has expressive aphasia/dysarthria/global weakness Extremities: B/L AKA Skin: No rashes, no ulcers,no icterus     Data Reviewed: I have personally reviewed following labs and imaging studies  CBC: Recent Labs  Lab 08/19/24 1839 08/20/24 0312 08/21/24 0257  WBC 11.5* 10.7* 8.9  NEUTROABS 8.8*  --   --   HGB 11.1* 11.2* 10.8*  HCT 34.6* 34.7* 33.9*  MCV 95.3 93.5 93.9  PLT 154 138* 137*   Basic Metabolic Panel: Recent Labs  Lab 08/19/24 1839 08/19/24 1848 08/20/24 0312 08/21/24 0257 08/22/24 0430 08/23/24 0529  NA 139  --  142 145 146* 142  K 4.5  --  4.2 3.7 3.0* 3.8  CL 108  --  110 105 103 103  CO2 13*  --  15* 25 33* 30  GLUCOSE 259*  --  192* 194* 77 86  BUN 98*  --  96* 83* 62* 51*  CREATININE 3.96*  --  3.72* 2.81* 2.00* 1.72*  CALCIUM  8.0*  --  7.8* 7.5* 7.5* 7.7*  MG  --  2.3  --   --  2.3  --   PHOS  --  6.1*  --   --   --   --      Recent Results (from the past 240 hours)  Culture, blood (Routine X 2) w Reflex to ID Panel     Status: None (Preliminary result)   Collection Time: 08/19/24  6:39 PM   Specimen: BLOOD LEFT ARM  Result Value Ref Range Status   Specimen Description BLOOD LEFT ARM  Final   Special Requests   Final    BOTTLES DRAWN AEROBIC AND ANAEROBIC Blood Culture results may not be optimal due to an inadequate volume of blood received in culture bottles   Culture   Final    NO GROWTH 4 DAYS Performed  at [facility], [address], [place], [name] [address]    Report Status PENDING  Incomplete  Culture, blood (Routine X 2) w Reflex to ID Panel     Status: None (Preliminary result)   Collection Time: 08/19/24  6:42 PM   Specimen: BLOOD RIGHT HAND  Result Value Ref Range Status   Specimen Description BLOOD RIGHT HAND  Final   Special Requests   Final    BOTTLES DRAWN AEROBIC AND ANAEROBIC Blood Culture adequate volume   Culture   Final    NO GROWTH 4 DAYS Performed at [facility], [address] [place], [name] [address]    Report Status PENDING  Incomplete     Radiology Studies: No results found.   Scheduled Meds:  aspirin   81 mg Oral Daily   carvedilol   3.125 mg Oral BID WC   Chlorhexidine  Gluconate Cloth  6 each Topical Daily   clopidogrel   75 mg Oral Daily   ezetimibe   10 mg Oral Daily   feeding supplement  237 mL Oral TID BM   heparin   5,000 Units Subcutaneous Q8H   insulin  aspart  0-15 Units Subcutaneous TID WC   insulin  aspart  0-5 Units Subcutaneous QHS   insulin  glargine  12 Units Subcutaneous Daily   multivitamin with minerals  1 tablet Oral Daily   rosuvastatin   40 mg Oral QPM   Continuous Infusions:     LOS: 4 days   Ivonne Mustache, MD [facility] P1/28/2026, 10:22 AM  "

## 2024-08-23 NOTE — Progress Notes (Signed)
" ° °  Palliative Medicine Inpatient Follow Up Note  HPI:  [name] is a 54 year old female with a past medical history significant for heart failure, peripheral arterial disease requiring bilateral above-the-knee amputations, tobacco abuse, anxiety, arthritis, depression, GERD, hypertension, and type 2 diabetes mellitus. [name] was admitted to [facility] from [facility] on January 24 in the setting of progressive renal failure. Palliative care has been asked to support additional goals of care conversations.   Today's Discussion 08/23/2024  I reviewed the chart notes including nursing notes from [place], [place] - Cardiology,Dr. Rosemarie, Sari Ginsberg, PT & RN [facility]. I also reviewed vital signs - elevated SBP this morning nursing flowsheets - eating 25-50% of meals - needs 1:1 assistance , medication administrations record, labs from today inclusive of BMP- improved Cr of 1.72 ; no imaging within the last 24 hours to review.   I met with [name] at bedside this morning, she is awake and aware of person and place though otherwise experiences perseveration associated with her aphasia. She is able to use both arms though is weaker on the L side. [name] exemplifies no signs of distress upon assessment this morning.   CIR evaluating [name] for potential admission versus SNF placement at [facility].   Questions and concerns addressed/Palliative Support Provided.   Objective Assessment: Vital Signs Vitals:   08/23/24 0415 08/23/24 0844  BP: (!) 149/57 (!) 160/58  Pulse: 70   Resp: 17 16  Temp: 98.9 F (37.2 C) 98.7 F (37.1 C)  SpO2: 96%     Intake/Output Summary (Last 24 hours) at 08/23/2024 1017 Last data filed at 08/23/2024 0415 Gross per 24 hour  Intake 720 ml  Output 400 ml  Net 320 ml   Last Weight  Most recent update: 08/23/2024  4:21 AM    Weight  63.1 kg (139 lb 1.8 oz)            Gen: Middle-age Caucasian female chronically ill in appearance HEENT:  moist mucous membranes CV: Regular rate and rhythm. PULM: On room air breathing is even and nonlabored ABD: soft/nontender  EXT: Bilateral AKA's Neuro: Aware of self and place, expressive aphasia  SUMMARY OF RECOMMENDATIONS   DNAR/DNI  Continue current care allowing time for outcomes   Patient showing signs of improvements - has passed swallow evaluation, PT/OT working with her    CIR evaluating for possible admission versus [name] transitioning to [facility]  Ongoing palliative medicine team support as needed ______________________________________________________________________________________ [name] [facility] Palliative Medicine Team Team Cell Phone: [phone] Please utilize secure chat with additional questions, if there is no response within 30 minutes please call the above phone number  I personally spent a total of 25 minutes in the care of the patient today including preparing to see the patient, getting/reviewing separately obtained history, performing a medically appropriate exam/evaluation, counseling and educating, placing orders, documenting clinical information in the EHR, and coordinating care.     "

## 2024-08-23 NOTE — Plan of Care (Signed)

## 2024-08-23 NOTE — Progress Notes (Signed)
 Inpatient Rehab Admissions Coordinator:   Rescreened for progress.  Pt continues to require max to total assist of 2 people for bed level mobility.  Per Dr. Darlis documentation today, she is stable for discharge.  In order to qualify for CIR, pts need to demonstrate the ability to tolerate the intensity of rehab and also the ability to make large gains in a short period of time.  Unfortunately she does not meet these criteria at this time.  Recommend TOC pursue other rehab venues at this time.    Reche Lowers, PT, DPT Admissions Coordinator [phone] 08/23/24 4:37 PM

## 2024-08-23 NOTE — Progress Notes (Addendum)
 "  Progress Note  Patient Name: [name] Date of Encounter: 08/23/2024 [facility] Cardiologist: Jayson Sierras, MD   Interval Summary   Sleepy, answers questions with one word then falls back asleep Described breathing as slow Shook head yes to chest pain  Vital Signs Vitals:   08/22/24 1624 08/22/24 1920 08/23/24 0014 08/23/24 0415  BP: (!) 137/52 (!) 151/58 (!) 159/85 (!) 149/57  Pulse: 78  79 70  Resp: 18 18 18 17   Temp: 98.5 F (36.9 C) 98.3 F (36.8 C) 99.1 F (37.3 C) 98.9 F (37.2 C)  TempSrc: Oral Oral Axillary Axillary  SpO2: 96% 96%  96%  Weight:    63.1 kg  Height:        Intake/Output Summary (Last 24 hours) at 08/23/2024 0727 Last data filed at 08/23/2024 0415 Gross per 24 hour  Intake 1110 ml  Output 400 ml  Net 710 ml      08/23/2024    4:15 AM 08/22/2024    3:53 AM 08/21/2024    4:23 AM  Last 3 Weights  Weight (lbs) 139 lb 1.8 oz 138 lb 10.7 oz 137 lb 2 oz  Weight (kg) 63.1 kg 62.9 kg 62.2 kg      Telemetry/ECG  Not on telemetry - Personally Reviewed  Physical Exam  GEN: No acute distress.   Neck: With head positioning and sleeping unable to assess JVD at this time Cardiac: RRR, 3/6 systolic murmur Respiratory: Clear to auscultation bilaterally diminished at based [though patient was sleeping and would not take deep inspiration] MS: bilateral BKA  Assessment & Plan  54 year old woman with a history of heart failure with reduced ejection fraction, low-flow low gradient aortic stenosis (probably mild or moderate), moderate mitral insufficiency, history of high burden of PVCs (unable to perform ablation due to lack of PVCs during the procedure), chronic kidney disease stage III, hyperlipidemia, uncontrolled DM type II, history of COPD, tobacco use and pulmonary embolism, PAD with bilateral above-the-knee amputations who had been admitted at [facility] 1/20 with acute on chronic HF exacerbation with cardiorenal syndrome and stroke  like symptoms. She was transferred to [facility] for further evaluation.    Head imaging showing multiple infarcts throughout the brain, suspected to be cardio-embolic. Neurology has now signed off. AKI on CKD, possible over diuresis as patient was euvolemic-dry on arrival. Nephrology signed off.    Patient's family transitioned patient to DNR and comfort care, though comfort care has now been cancelled as patient's responsiveness is improving. DNR remains. Palliative following.    Acute on chronic HFrEF AKI on CKD Echo this admission showed EF improvement to 50-55% with possible RWMA in RCA distrubution. Invasive coronary evaluation not recommended at this time with her acute CVA and AKI on CKD. Can re-address as clinical picture continues to change.  Renal function continues to improve off diuresis. Cr today 1.72 Net IO Since Admission: -990.82 mL [08/23/24 0733] On exam her volume status remains stable, will continue to hold diuresis with improving kidney function. If she develops an oxygen requirement consider CXR though can continue to hold off.   Switch BB to coreg  3.125 mg BID [remained hypertensive systolic ~150 on low dose BB] GDMT on hold with AKI on CKD, neurology SBP goal < 160  CVA Unfortunately telemetry was discontinued as patient was agitated and pulling leads off overnight, restart when clinically able to in order to continue to assess for AF. Pending disposition and GOC apply 30 day monitor for further evaluation.  Could consider TEE  though would also need to consider in context of situation with her current acute state. TEE unlikely to change current management, though patient is becoming more alert, continue to re-assess.  Neurology has signed off  Hypertension BP: 160/58 Neurology has signed off, SBP goal < 160. Avoiding hypotension.  Switch BB to coreg  3.125 mg BID  Frequent ectopy Seen by EP previously for high PVC burden, attempted PVC ablation though was aborted given  very low frequency of PVCs at the time, including with isoproterenol .  Keep K > 4 and Mag > 2.    BB as above  Chest pain Patient vaguely mentioned chest pain, though with her mentation unclear if she is truly having it. As described above she is not a candidate for coronary evaluation at this time and she appears comfortable on exam.  Will continue to monitor, will work on BP control for now.    Severe PAD s/p bilateral BKA  CAD Continue ASA 81 mg Continue plavix  75 mg BB as above   Tobacco use NRT per primary   Hyperlipidemia LDL 119   HDL 36   Currently above goal, LDL goal < 55 Consider outpatient lipid clinic referral.  Continue crestor  40 mg Start zetia  10 mg   Elevated Troponin 70 -> 75 -> 128 Though RWMA noted on echocardiogram, troponin elevation suspected to be 2/2 demand ischemia given trend. Coronary evaluation on hold as above. As she clinically improves can reconsider coronary evaluation.   VHD AI and MR improved when compared to prior echocardiogram, continue to follow outpatient.   Hypokalemia Improved, K today 3.8   Per primary T2DM [A1c 10.1%] CVA AKI on CKD Anemia of chronic disease GOC   For questions or updates, please contact [facility] Please consult [email or web address] for contact info under       Signed, Leontine LOISE Salen, PA-C   PT seen and examined  I agree with assessment as noted above by LITTIE Salen  PT very sleepy when I came in   Nodded answers    No CP  NO SOB  Neck  JVP is normal Lungs are CTA Cardiac   RRR  II/VI systolic murmur LSB Ext  s/p bilateral BKA  Off of tele  HFimpEF   I have reviewed second echo from this admit  LV is mildly dilated   The basal inferior / inferolateral walls are hypokinetic     Volume status overall appears OK Agree with switch to carvedilol     Follow BP / HR  CVA   Neuro has signed off       WOuld be good to get back on telemetry   Look for afib    Hold on further investigation    HTN   HIgh today  Switching to Coreg   Will follow to make sure BP improves   HL  On Crestor   Zetia  added   PAD  ON ASA and Plavix    Will follow   No further recomm for now   FUrther plans will be based on how she progresses   Vina Gull MD "

## 2024-08-23 NOTE — Plan of Care (Signed)
 " Problem: Education: Goal: Knowledge of General Education information will improve Description: Including pain rating scale, medication(s)/side effects and non-pharmacologic comfort measures 08/23/2024 1708 by Dallie Waddell SAUNDERS, RN Outcome: Progressing 08/23/2024 1559 by Dallie Waddell SAUNDERS, RN Outcome: Progressing   Problem: Health Behavior/Discharge Planning: Goal: Ability to manage health-related needs will improve 08/23/2024 1708 by Dallie Waddell SAUNDERS, RN Outcome: Progressing 08/23/2024 1559 by Dallie Waddell SAUNDERS, RN Outcome: Progressing   Problem: Clinical Measurements: Goal: Ability to maintain clinical measurements within normal limits will improve 08/23/2024 1708 by Dallie Waddell SAUNDERS, RN Outcome: Progressing 08/23/2024 1559 by Dallie Waddell SAUNDERS, RN Outcome: Progressing Goal: Will remain free from infection 08/23/2024 1708 by Dallie Waddell SAUNDERS, RN Outcome: Progressing 08/23/2024 1559 by Dallie Waddell SAUNDERS, RN Outcome: Progressing Goal: Diagnostic test results will improve 08/23/2024 1708 by Dallie Waddell SAUNDERS, RN Outcome: Progressing 08/23/2024 1559 by Dallie Waddell SAUNDERS, RN Outcome: Progressing Goal: Respiratory complications will improve 08/23/2024 1708 by Dallie Waddell SAUNDERS, RN Outcome: Progressing 08/23/2024 1559 by Dallie Waddell SAUNDERS, RN Outcome: Progressing Goal: Cardiovascular complication will be avoided 08/23/2024 1708 by Dallie Waddell SAUNDERS, RN Outcome: Progressing 08/23/2024 1559 by Dallie Waddell SAUNDERS, RN Outcome: Progressing   Problem: Activity: Goal: Risk for activity intolerance will decrease 08/23/2024 1708 by Dallie Waddell SAUNDERS, RN Outcome: Progressing 08/23/2024 1559 by Dallie Waddell SAUNDERS, RN Outcome: Progressing   Problem: Nutrition: Goal: Adequate nutrition will be maintained 08/23/2024 1708 by Dallie Waddell SAUNDERS, RN Outcome: Progressing 08/23/2024 1559 by Dallie Waddell SAUNDERS, RN Outcome: Progressing   Problem: Coping: Goal: Level of anxiety will decrease 08/23/2024 1708 by Dallie Waddell SAUNDERS, RN Outcome:  Progressing 08/23/2024 1559 by Dallie Waddell SAUNDERS, RN Outcome: Progressing   Problem: Elimination: Goal: Will not experience complications related to bowel motility 08/23/2024 1708 by Dallie Waddell SAUNDERS, RN Outcome: Progressing 08/23/2024 1559 by Dallie Waddell SAUNDERS, RN Outcome: Progressing Goal: Will not experience complications related to urinary retention 08/23/2024 1708 by Dallie Waddell SAUNDERS, RN Outcome: Progressing 08/23/2024 1559 by Dallie Waddell SAUNDERS, RN Outcome: Progressing   Problem: Pain Managment: Goal: General experience of comfort will improve and/or be controlled 08/23/2024 1708 by Dallie Waddell SAUNDERS, RN Outcome: Progressing 08/23/2024 1559 by Dallie Waddell SAUNDERS, RN Outcome: Progressing   Problem: Safety: Goal: Ability to remain free from injury will improve 08/23/2024 1708 by Dallie Waddell SAUNDERS, RN Outcome: Progressing 08/23/2024 1559 by Dallie Waddell SAUNDERS, RN Outcome: Progressing   Problem: Skin Integrity: Goal: Risk for impaired skin integrity will decrease 08/23/2024 1708 by Dallie Waddell SAUNDERS, RN Outcome: Progressing 08/23/2024 1559 by Dallie Waddell SAUNDERS, RN Outcome: Progressing   Problem: Education: Goal: Ability to describe self-care measures that may prevent or decrease complications (Diabetes Survival Skills Education) will improve 08/23/2024 1708 by Dallie Waddell SAUNDERS, RN Outcome: Progressing 08/23/2024 1559 by Dallie Waddell SAUNDERS, RN Outcome: Progressing Goal: Individualized Educational Video(s) 08/23/2024 1708 by Dallie Waddell SAUNDERS, RN Outcome: Progressing 08/23/2024 1559 by Dallie Waddell SAUNDERS, RN Outcome: Progressing   Problem: Coping: Goal: Ability to adjust to condition or change in health will improve 08/23/2024 1708 by Dallie Waddell SAUNDERS, RN Outcome: Progressing 08/23/2024 1559 by Dallie Waddell SAUNDERS, RN Outcome: Progressing   Problem: Fluid Volume: Goal: Ability to maintain a balanced intake and output will improve 08/23/2024 1708 by Dallie Waddell SAUNDERS, RN Outcome: Progressing 08/23/2024 1559 by  Dallie Waddell SAUNDERS, RN Outcome: Progressing   Problem: Health Behavior/Discharge Planning: Goal: Ability to identify and utilize available resources and services will improve 08/23/2024 1708 by Dallie Waddell SAUNDERS, RN Outcome: Progressing 08/23/2024 1559  by Dallie Waddell SAUNDERS, RN Outcome: Progressing Goal: Ability to manage health-related needs will improve 08/23/2024 1708 by Dallie Waddell SAUNDERS, RN Outcome: Progressing 08/23/2024 1559 by Dallie Waddell SAUNDERS, RN Outcome: Progressing   Problem: Metabolic: Goal: Ability to maintain appropriate glucose levels will improve 08/23/2024 1708 by Dallie Waddell SAUNDERS, RN Outcome: Progressing 08/23/2024 1559 by Dallie Waddell SAUNDERS, RN Outcome: Progressing   Problem: Nutritional: Goal: Maintenance of adequate nutrition will improve 08/23/2024 1708 by Dallie Waddell SAUNDERS, RN Outcome: Progressing 08/23/2024 1559 by Dallie Waddell SAUNDERS, RN Outcome: Progressing Goal: Progress toward achieving an optimal weight will improve 08/23/2024 1708 by Dallie Waddell SAUNDERS, RN Outcome: Progressing 08/23/2024 1559 by Dallie Waddell SAUNDERS, RN Outcome: Progressing   Problem: Skin Integrity: Goal: Risk for impaired skin integrity will decrease 08/23/2024 1708 by Dallie Waddell SAUNDERS, RN Outcome: Progressing 08/23/2024 1559 by Dallie Waddell SAUNDERS, RN Outcome: Progressing   Problem: Tissue Perfusion: Goal: Adequacy of tissue perfusion will improve 08/23/2024 1708 by Dallie Waddell SAUNDERS, RN Outcome: Progressing 08/23/2024 1559 by Dallie Waddell SAUNDERS, RN Outcome: Progressing   Problem: Education: Goal: Knowledge of disease or condition will improve 08/23/2024 1708 by Dallie Waddell SAUNDERS, RN Outcome: Progressing 08/23/2024 1559 by Dallie Waddell SAUNDERS, RN Outcome: Progressing Goal: Knowledge of secondary prevention will improve (MUST DOCUMENT ALL) 08/23/2024 1708 by Dallie Waddell SAUNDERS, RN Outcome: Progressing 08/23/2024 1559 by Dallie Waddell SAUNDERS, RN Outcome: Progressing Goal: Knowledge of patient specific risk factors will improve  (DELETE if not current risk factor) 08/23/2024 1708 by Dallie Waddell SAUNDERS, RN Outcome: Progressing 08/23/2024 1559 by Dallie Waddell SAUNDERS, RN Outcome: Progressing   Problem: Ischemic Stroke/TIA Tissue Perfusion: Goal: Complications of ischemic stroke/TIA will be minimized 08/23/2024 1708 by Dallie Waddell SAUNDERS, RN Outcome: Progressing 08/23/2024 1559 by Dallie Waddell SAUNDERS, RN Outcome: Progressing   Problem: Coping: Goal: Will verbalize positive feelings about self 08/23/2024 1708 by Dallie Waddell SAUNDERS, RN Outcome: Progressing 08/23/2024 1559 by Dallie Waddell SAUNDERS, RN Outcome: Progressing Goal: Will identify appropriate support needs 08/23/2024 1708 by Dallie Waddell SAUNDERS, RN Outcome: Progressing 08/23/2024 1559 by Dallie Waddell SAUNDERS, RN Outcome: Progressing   Problem: Health Behavior/Discharge Planning: Goal: Ability to manage health-related needs will improve 08/23/2024 1708 by Dallie Waddell SAUNDERS, RN Outcome: Progressing 08/23/2024 1559 by Dallie Waddell SAUNDERS, RN Outcome: Progressing Goal: Goals will be collaboratively established with patient/family 08/23/2024 1708 by Dallie Waddell SAUNDERS, RN Outcome: Progressing 08/23/2024 1559 by Dallie Waddell SAUNDERS, RN Outcome: Progressing   Problem: Self-Care: Goal: Ability to participate in self-care as condition permits will improve 08/23/2024 1708 by Dallie Waddell SAUNDERS, RN Outcome: Progressing 08/23/2024 1559 by Dallie Waddell SAUNDERS, RN Outcome: Progressing Goal: Verbalization of feelings and concerns over difficulty with self-care will improve 08/23/2024 1708 by Dallie Waddell SAUNDERS, RN Outcome: Progressing 08/23/2024 1559 by Dallie Waddell SAUNDERS, RN Outcome: Progressing Goal: Ability to communicate needs accurately will improve 08/23/2024 1708 by Dallie Waddell SAUNDERS, RN Outcome: Progressing 08/23/2024 1559 by Dallie Waddell SAUNDERS, RN Outcome: Progressing   Problem: Nutrition: Goal: Risk of aspiration will decrease 08/23/2024 1708 by Dallie Waddell SAUNDERS, RN Outcome: Progressing 08/23/2024 1559 by Dallie Waddell SAUNDERS, RN Outcome: Progressing Goal: Dietary intake will improve 08/23/2024 1708 by Dallie Waddell SAUNDERS, RN Outcome: Progressing 08/23/2024 1559 by Dallie Waddell SAUNDERS, RN Outcome: Progressing   "

## 2024-08-24 DIAGNOSIS — J9601 Acute respiratory failure with hypoxia: Secondary | ICD-10-CM | POA: Diagnosis not present

## 2024-08-24 LAB — CULTURE, BLOOD (ROUTINE X 2)
Culture: NO GROWTH
Culture: NO GROWTH
Special Requests: ADEQUATE

## 2024-08-24 LAB — GLUCOSE, CAPILLARY
Glucose-Capillary: 216 mg/dL — ABNORMAL HIGH (ref 70–99)
Glucose-Capillary: 232 mg/dL — ABNORMAL HIGH (ref 70–99)
Glucose-Capillary: 247 mg/dL — ABNORMAL HIGH (ref 70–99)
Glucose-Capillary: 250 mg/dL — ABNORMAL HIGH (ref 70–99)
Glucose-Capillary: 254 mg/dL — ABNORMAL HIGH (ref 70–99)
Glucose-Capillary: 274 mg/dL — ABNORMAL HIGH (ref 70–99)
Glucose-Capillary: 290 mg/dL — ABNORMAL HIGH (ref 70–99)

## 2024-08-24 NOTE — Plan of Care (Signed)
" °  Problem: Health Behavior/Discharge Planning: Goal: Ability to manage health-related needs will improve Outcome: Progressing   Problem: Clinical Measurements: Goal: Ability to maintain clinical measurements within normal limits will improve Outcome: Progressing   Problem: Clinical Measurements: Goal: Will remain free from infection Outcome: Progressing   Problem: Clinical Measurements: Goal: Diagnostic test results will improve Outcome: Progressing    Problem: Clinical Measurements: Goal: Diagnostic test results will improve Outcome: Progressing   Problem: Activity: Goal: Risk for activity intolerance will decrease Outcome: Progressing   Problem: Coping: Goal: Level of anxiety will decrease Outcome: Progressing   Problem: Pain Managment: Goal: General experience of comfort will improve and/or be controlled Outcome: Progressing   Problem: Skin Integrity: Goal: Risk for impaired skin integrity will decrease Outcome: Progressing     "

## 2024-08-24 NOTE — Plan of Care (Signed)

## 2024-08-24 NOTE — TOC Progression Note (Addendum)
 Transition of Care [facility]) - Progression Note    Patient Details  Name: [name] MRN: [id number] Date of Birth: May 16, 1971  [facility] CM/SW Contact  Isaiah Public, LCSWA Phone Number: 08/24/2024, 10:46 AM  Clinical Narrative:     CSW received consult for possible SNF placement at time of discharge. Due to patients current orientation CSW spoke with patients sister [name] regarding PT recommendation of SNF placement at time of discharge. Patients sister reports that patient lives at home alone. Patients sister expressed understanding of PT recommendation and is agreeable to SNF placement for patient at time of discharge. Patients sister reports preference for [facility]. Patients sister gave CSW permission to fax out patient for SNF placement.CSW discussed insurance authorization process. All questions answered.No further questions reported at this time. CSW to continue to follow and assist with discharge planning needs.   Update- CSW request for Natalie CMA to start insurance authorization for patient.   Update- Patients passr is pending. CSW submitted requested clinicals to [place] must for review.  UpdateGLENWOOD Edelman CMA informed CSW that she spoke with Dottie with home and community who informed CMA that they do  not manage patients plan.   Update- CSW spoke with Jon with [facility] who confirmed SNF bed for pateint. Jon confirmed that facility will start insurance authorization for patient.    Expected Discharge Plan: IP Rehab Facility                 Expected Discharge Plan and Services   Discharge Planning Services: CM Consult Post Acute Care Choice: IP Rehab Living arrangements for the past 2 months: Single Family Home                   DME Agency: NA                   Social Drivers of Health (SDOH) Interventions SDOH Screenings   Food Insecurity: No Food Insecurity (08/22/2024)  Housing: Low Risk (08/22/2024)  Transportation  Needs: No Transportation Needs (08/22/2024)  Utilities: Not At Risk (08/22/2024)  Alcohol  Screen: Low Risk (03/18/2022)  Depression (PHQ2-9): Medium Risk (03/18/2022)  Financial Resource Strain: Low Risk (08/16/2024)   [facility]  Physical Activity: Inactive (08/16/2024)   Received from [facility]  Social Connections: Socially Isolated (08/16/2024)   Received from [facility]  Stress: No Stress Concern Present (08/16/2024)   Received from [facility]  Tobacco Use: High Risk (08/22/2024)  Health Literacy: Medium Risk (08/16/2024)   Received from [facility]    Readmission Risk Interventions     No data to display

## 2024-08-24 NOTE — Progress Notes (Signed)
 " PROGRESS NOTE  [name]  FMW:[id number] DOB: 1970/09/19 DOA: 08/19/2024 PCP: Jeanette Comer BRAVO, PA-C   Brief Narrative: Patient is a 54 year old female with history of HFrEF, PVCs, tobacco use, hypertension, peripheral artery disease, bilateral AKA, CKD stage IIIb, hyperlipidemia who was transferred from [facility] for  further management of progressive renal failure.  She was initially admitted there on 1/20 and was currently being managed for acute on chronic CHF exacerbation, AMS, left-sided weakness, concern for stroke, progressive renal failure, elevated troponin.  On presentation to there, she was confused, code stroke was called.  CT head showed focal hypodensity in the right caudate body and adjacent white matter concerning for stroke.  She continued to decline and was requested to transfer here.  MRI showed acute ischemic stroke.  Workup completed.  Nephrology, neurology, cardiology, palliative care were following.  PT recommending AIR on discharge.  But no other recommendation isSN facility.  Has clinically improved significantly.  Can be discharged to SNF when possible  Assessment & Plan:  Principal Problem:   Acute hypoxemic respiratory failure (HCC) Active Problems:   Acute HFrEF (heart failure with reduced ejection fraction) (HCC)   Cerebrovascular accident (CVA) due to embolism of cerebral artery (HCC)   Status post below-knee amputation of both lower extremities (HCC)  Acute ischemic stroke: MRI showed scattered infarcts including bilateral frontal, parietal, occipital, basal ganglia, left thalamus and cerebellum.  Suspected embolic phenomena.  Neurology following.  Currently on aspirin , statin, Plavix , PT/OT/speech consulted.  PT recommended AIR.  TEE deferred for now. She has global weakness.  Obeys commands .  Alert and awake, oriented to place.  Has expressive aphasia/dysarthria  Altered mentation/dysphagia: Suspected to be from a stroke versus seizure.   Was started on Keppra at [facility], now not on keppra Mentation has improved.  Currently alert and awake, oriented to place.  Has expressive aphasia.  On dysphagia 3 diet  Acute on chronic HFrEF: Echo done on 08/07/2024 showed EF of 35 to 40%.  Repeat echo done here showed EF of 50 to 55%, low normal left ventricular function, severely dilated left ventricular cavity, eccentric left ventricular hypertrophy, mild mitral valve regurgitation .cardiology already consulted and following.  BNP was elevated.  Currently appears euvolemic.  Earlier she was on Lasix , Aldactone, Entresto , currently on hold due to worsening renal function.  Currently on Coreg   AKI in CKD stage IIIb/AGMA: Creatinine was elevated up to 4.2 now better than baseline.  Looks like her baseline creatinine is around 2.3.  Nephrology signed off  Hypertension:   On  coreg .  Continue as needed medications for severe hypertension  History of coronary artery disease/elevated troponin: Troponin elevation likely from worsening renal function and heart failure.  On aspirin , Plavix , statin.  Cardiology planning for further evaluation of her coronary arteries after she clinically improves  History of peripheral artery disease/carotid artery stenosis: Status post bilateral BKA  Uncontrolled diabetes type 2: Last A1c noted to be 10.1.  Currently on sliding scale.  Monitor blood sugars.  Diabetic coordinator consulted.  She takes insulin  at home.  Restarted Lantus  at 12 units,Daily. Continue to  adjust dose as appropriate.  Anemia of chronic disease: In the setting of CKD.  Currently hemoglobin stable  Hyperlipidemia: On statin,zetia .  LDL of 119  Goals of care: Patient with multiple comorbidities now with altered mentation, significant acute ischemic stroke, renal failure.  CODE STATUS DNR/DNI.  Palliative care was consulted for goals of care.  She has bilateral AKA.  Poor  quality of life. If her mentation does not improve, she may be a candidate for  comfort care.  [name] understands this plan and anticipates improvement before discussing about comfort care/hospice options.  Patient is clinically improving now.Plan for SnF  Wound 08/19/24 Pressure Injury Sacrum Deep Tissue Pressure Injury - Purple or maroon localized area of discolored intact skin or blood-filled blister due to damage of underlying soft tissue from pressure and/or shear. (Active)      Nutrition Problem: Inadequate oral intake Etiology: dysphagia Wound 08/19/24 Pressure Injury Sacrum Deep Tissue Pressure Injury - Purple or maroon localized area of discolored intact skin or blood-filled blister due to damage of underlying soft tissue from pressure and/or shear. (Active)    DVT prophylaxis:heparin  injection 5,000 Units Start: 08/19/24 2200 SCDs Start: 08/19/24 1749     Code Status: Limited: Do not attempt resuscitation (DNR) -DNR-LIMITED -Do Not Intubate/DNI   Family Communication:Called and discussed with [name] on phone on 1/26  Patient status:Inpatient  Patient is from :home  Anticipated discharge to:SNF  Estimated DC date: Whenever possible   Consultants: Cardiology, nephrology, neurology  Procedures: None yet  Antimicrobials:  Anti-infectives (From admission, onward)    None       Subjective: Patient seen and examined at bedside today.  She looks comfortable this morning.  Lying in bed.  Alert and awake oriented to place.  Could not tell the correct month.  Complains of some pain on the right side of the chest.  Not in any  apparent distress  Objective: Vitals:   08/24/24 0020 08/24/24 0412 08/24/24 0741 08/24/24 1021  BP: (!) 145/47 (!) 148/60 (!) 154/63 (!) 154/63  Pulse: 79 76  76  Resp: 16 18 17    Temp: 98.4 F (36.9 C) 98.4 F (36.9 C) 98 F (36.7 C)   TempSrc: Oral Oral Oral   SpO2: 92% 96% 94%   Weight:  63.1 kg    Height:        Intake/Output Summary (Last 24 hours) at 08/24/2024 1127 Last data filed at  08/24/2024 0413 Gross per 24 hour  Intake 220 ml  Output 350 ml  Net -130 ml   Filed Weights   08/22/24 0353 08/23/24 0415 08/24/24 0412  Weight: (P) 62.9 kg 63.1 kg 63.1 kg    Examination:     General exam: Overall comfortable, not in distress, chronically deconditioned HEENT: PERRL Respiratory system:  no wheezes or crackles  Cardiovascular system: S1 & S2 heard, RRR.  Gastrointestinal system: Abdomen is nondistended, soft and nontender. Central nervous system: Alert and awake, oriented to place only, expressive aphasia, global weakness Extremities: B/L AKA Skin: No rashes, no ulcers,no icterus     Data Reviewed: I have personally reviewed following labs and imaging studies  CBC: Recent Labs  Lab 08/19/24 1839 08/20/24 0312 08/21/24 0257  WBC 11.5* 10.7* 8.9  NEUTROABS 8.8*  --   --   HGB 11.1* 11.2* 10.8*  HCT 34.6* 34.7* 33.9*  MCV 95.3 93.5 93.9  PLT 154 138* 137*   Basic Metabolic Panel: Recent Labs  Lab 08/19/24 1839 08/19/24 1848 08/20/24 0312 08/21/24 0257 08/22/24 0430 08/23/24 0529  NA 139  --  142 145 146* 142  K 4.5  --  4.2 3.7 3.0* 3.8  CL 108  --  110 105 103 103  CO2 13*  --  15* 25 33* 30  GLUCOSE 259*  --  192* 194* 77 86  BUN 98*  --  96* 83* 62* 51*  CREATININE 3.96*  --  3.72* 2.81* 2.00* 1.72*  CALCIUM  8.0*  --  7.8* 7.5* 7.5* 7.7*  MG  --  2.3  --   --  2.3  --   PHOS  --  6.1*  --   --   --   --      Recent Results (from the past 240 hours)  Culture, blood (Routine X 2) w Reflex to ID Panel     Status: None   Collection Time: 08/19/24  6:39 PM   Specimen: BLOOD LEFT ARM  Result Value Ref Range Status   Specimen Description BLOOD LEFT ARM  Final   Special Requests   Final    BOTTLES DRAWN AEROBIC AND ANAEROBIC Blood Culture results may not be optimal due to an inadequate volume of blood received in culture bottles   Culture   Final    NO GROWTH 5 DAYS Performed at [facility], [address] [place], [name]  [address]    Report Status 08/24/2024 FINAL  Final  Culture, blood (Routine X 2) w Reflex to ID Panel     Status: None   Collection Time: 08/19/24  6:42 PM   Specimen: BLOOD RIGHT HAND  Result Value Ref Range Status   Specimen Description BLOOD RIGHT HAND  Final   Special Requests   Final    BOTTLES DRAWN AEROBIC AND ANAEROBIC Blood Culture adequate volume   Culture   Final    NO GROWTH 5 DAYS Performed at [facility], [address], [place], [name] [address]    Report Status 08/24/2024 FINAL  Final     Radiology Studies: No results found.   Scheduled Meds:  aspirin   81 mg Oral Daily   carvedilol   3.125 mg Oral BID WC   clopidogrel   75 mg Oral Daily   ezetimibe   10 mg Oral Daily   feeding supplement  237 mL Oral TID BM   heparin   5,000 Units Subcutaneous Q8H   insulin  aspart  0-15 Units Subcutaneous TID WC   insulin  aspart  0-5 Units Subcutaneous QHS   insulin  glargine  12 Units Subcutaneous Daily   multivitamin with minerals  1 tablet Oral Daily   rosuvastatin   40 mg Oral QPM   Continuous Infusions:     LOS: 5 days   Ivonne Mustache, MD [facility] P1/29/2026, 11:27 AM  "

## 2024-08-24 NOTE — Progress Notes (Addendum)
 "  Progress Note  Patient Name: [name] Date of Encounter: 08/24/2024 [facility] Cardiologist: Jayson Sierras, MD   Interval Summary   Having right sided and bottom pain. Denied chest pain and shortness of breath  Vital Signs Vitals:   08/24/24 0020 08/24/24 0412 08/24/24 0741 08/24/24 1021  BP: (!) 145/47 (!) 148/60 (!) 154/63 (!) 154/63  Pulse: 79 76  76  Resp: 16 18 17    Temp: 98.4 F (36.9 C) 98.4 F (36.9 C) 98 F (36.7 C)   TempSrc: Oral Oral Oral   SpO2: 92% 96% 94%   Weight:  63.1 kg    Height:        Intake/Output Summary (Last 24 hours) at 08/24/2024 1047 Last data filed at 08/24/2024 0413 Gross per 24 hour  Intake 220 ml  Output 350 ml  Net -130 ml      08/24/2024    4:12 AM 08/23/2024    4:15 AM 08/22/2024    3:53 AM  Last 3 Weights  Weight (lbs) 139 lb 1.8 oz 139 lb 1.8 oz 138 lb 10.7 oz  Weight (kg) 63.1 kg 63.1 kg 62.9 kg      Telemetry/ECG  Not on telemetry - Personally Reviewed  Physical Exam  GEN: No acute distress.   Neck: No JVD Cardiac: RRR, no murmurs, rubs, or gallops.  Respiratory: Clear to auscultation bilaterally. GI: Soft, nontender, non-distended  MS: bilateral BKA  Assessment & Plan  54 year old woman with a history of heart failure with reduced ejection fraction, low-flow low gradient aortic stenosis (probably mild or moderate), moderate mitral insufficiency, history of high burden of PVCs (unable to perform ablation due to lack of PVCs during the procedure), chronic kidney disease stage III, hyperlipidemia, uncontrolled DM type II, history of COPD, tobacco use and pulmonary embolism, PAD with bilateral above-the-knee amputations who had been admitted at [facility] 1/20 with acute on chronic HF exacerbation with cardiorenal syndrome and stroke like symptoms. She was transferred to [facility] for further evaluation.    Head imaging showing multiple infarcts throughout the brain, suspected to be cardio-embolic. Neurology  has now signed off. AKI on CKD, possible over diuresis as patient was euvolemic-dry on arrival. Nephrology signed off.    Patient's family transitioned patient to DNR and comfort care, though comfort care has now been cancelled as patient's responsiveness is improving. DNR remains. Palliative following.    Acute on chronic HFrEF AKI on CKD Echo this admission showed EF improvement to 50-55% with possible RWMA in RCA distrubution. Invasive coronary evaluation not recommended at this time with her acute CVA and AKI on CKD. Can re-address as clinical picture continues to change.  Renal function improving off diuresis. No Cr obtained today.  Net IO Since Admission: -620.82 mL [08/24/24 1048] Weight is up 8lbs since admission [though was over diuresed previously] .  On exam her volume status remains stable despite weight increase. Will hold off on diuresis for now.    Continue coreg  3.125 mg BID, avoiding hypotension for now with recent CVA GDMT on hold with AKI on CKD and avoidance of hypotension   CVA Unfortunately telemetry was discontinued as patient was agitated and pulling leads off overnight, restart when clinically able to in order to continue to assess for AF. Pending disposition and GOC apply 30 day monitor for further evaluation. Could consider TEE  though would also need to consider in context of situation with her current acute state. TEE unlikely to change current management, though patient  is becoming more alert, continue to re-assess.  Neurology has signed off   Hypertension BP: 154/63 Neurology has signed off, SBP goal < 160. Avoiding hypotension.  Coreg  as above.    Frequent ectopy Seen by EP previously for high PVC burden, attempted PVC ablation though was aborted given very low frequency of PVCs at the time, including with isoproterenol .  Keep K > 4 and Mag > 2.    Coreg  as above.    Severe PAD s/p bilateral BKA  CAD Continue ASA 81 mg Continue plavix  75 mg Coreg  as  above.    Tobacco use NRT per primary   Hyperlipidemia LDL 119   HDL 36   Currently above goal, LDL goal < 55 Consider outpatient lipid clinic referral. Will need repeat Lipid panel and LFTs in 6-8 weeks. Continue crestor  40 mg Continue zetia  10 mg   Elevated Troponin 70 -> 75 -> 128 Though RWMA noted on echocardiogram, troponin elevation suspected to be 2/2 demand ischemia given trend. Coronary evaluation on hold as above. As she clinically improves can reconsider coronary evaluation.   VHD AI and MR improved when compared to prior echocardiogram, continue to follow outpatient.   Hypokalemia Improved, no K today.   Sacral ulcer Patient reporting right sided/back pain. Primary team aware and nursing staff changing dressing/rotating patient Per primary   Per primary T2DM [A1c 10.1%] CVA AKI on CKD Anemia of chronic disease GOC   For questions or updates, please contact [facility] Please consult [email or web address] for contact info under       Signed, Leontine LOISE Salen, PA-C   Patient seen and examined, note reviewed with the signed Advanced Practice Provider. I personally reviewed laboratory data, imaging studies and relevant notes. I independently examined the patient and formulated the important aspects of the plan. I have personally discussed the plan with the patient and/or family. Comments or changes to the note/plan are indicated below.  Patient seen examined by her bedside.  She was awake on arrival for no specific complaints at this time.  Acute on chronic heart failure with reduced ejection fraction AKI on CKD History of CVA  She has had acute CVA during this admission has been started on aspirin  as well as statin.  Her echocardiogram which was done dismission does show improved EF there is some wall motion abnormalities.  At this time we will not be pursuing ischemic evaluation with invasive coronary angiography giving her recent CVA.  She is not  experiencing any anginal symptoms at this time.  Although she has had some aggressive diuresis giving her hypervolemia.  For now this is being held I agree with this.  I will continue her Coreg  3.125 mg twice daily and monitor. Hopefully creatinine continue to improve. Her blood pressure is elevated but we will gradually increase her antihypertensive. Continue to replete electrolytes keep K above 4 and mag above 2. Significant ectopy but hopefully once we can slightly go up on her beta-blocker this should help as well.  We will continue to follow with you  Judah Chevere DO, MS [place] Attending Cardiologist [facility]  [address] [place], [name] [address] [phone] Website: [email or web address]  "

## 2024-08-24 NOTE — TOC PASRR Note (Signed)
 [facility] IP TOC PASRR NOTE  RE: [name]  Date of Birth: [birth year]  Date: 08/24/2024    To Whom It May Concern:   Please be advised that the above-named patient will require a short-term nursing home stay - anticipated 30 days or less for rehabilitation and strengthening. The plan is for return home.

## 2024-08-24 NOTE — NC FL2 (Signed)
 " [place]  MEDICAID FL2 LEVEL OF CARE FORM     IDENTIFICATION  Patient Name: [name] Birthdate: Jul 06, 1971 Sex: female Admission Date (Current Location): 08/19/2024  [facility] and [place] Number:  Producer, Television/film/video and Address:  [facility], [address] [place], [name] [address]      Provider Number: [id number]  Attending Physician Name and Address:  Jillian Buttery, MD  Relative Name and Phone Number:  [name] Lamer (sister) [phone], [name] Montclair (son) [phone]    Current Level of Care: Hospital Recommended Level of Care: Skilled Nursing Facility Prior Approval Number:    Date Approved/Denied:   PASRR Number: PASRR under review  Discharge Plan: SNF    Current Diagnoses: Patient Active Problem List   Diagnosis Date Noted   Cerebrovascular accident (CVA) due to embolism of cerebral artery (HCC) 08/22/2024   Status post below-knee amputation of both lower extremities (HCC) 08/22/2024   Acute hypoxemic respiratory failure (HCC) 08/19/2024   Type 2 diabetes mellitus with hyperlipidemia (HCC) 08/22/2023   COPD (chronic obstructive pulmonary disease) (HCC) 08/22/2023   Obesity (BMI 30-39.9) 08/22/2023   Acute HFrEF (heart failure with reduced ejection fraction) (HCC) 08/21/2023   Abnormal CT scan, small bowel 06/18/2023   Impingement syndrome of left shoulder 10/22/2022   Debility 10/01/2021   CKD (chronic kidney disease) stage 3, GFR 30-59 ml/min (HCC) 09/25/2021   Hyponatremia 09/25/2021   Leukocytosis 09/25/2021   PAD (peripheral artery disease) 09/24/2021   COPD not affecting current episode of care 09/24/2021   Tobacco dependence 09/24/2021   Nausea with vomiting 03/12/2016   Dysphagia, pharyngoesophageal phase 08/24/2014   Constipation 04/20/2014   Liver abscess 02/21/2014   RUQ pain 01/31/2014   Septic shock (HCC) 02/05/2012   Hypokalemia 02/05/2012   Normocytic anemia 02/05/2012   Acute respiratory failure  with hypoxia (HCC) 01/28/2012   Appendicitis 01/28/2012   ARDS (adult respiratory distress syndrome) (HCC) 01/28/2012   Diabetes mellitus (HCC) 01/28/2012   Asthma 01/28/2012   Hypertension 01/28/2012    Orientation RESPIRATION BLADDER Height & Weight     Self, Time  Normal Incontinent Weight: 139 lb 1.8 oz (63.1 kg) Height:  5' 8 (172.7 cm) (prior to bilateral AKA)  BEHAVIORAL SYMPTOMS/MOOD NEUROLOGICAL BOWEL NUTRITION STATUS      Incontinent Diet (Please see discharge summary)  AMBULATORY STATUS COMMUNICATION OF NEEDS Skin   Extensive Assist Verbally (difficulty speaking)                         Personal Care Assistance Level of Assistance  Bathing, Feeding, Dressing Bathing Assistance: Maximum assistance Feeding assistance: Limited assistance Dressing Assistance: Maximum assistance     Functional Limitations Info  Hearing, Speech   Hearing Info: Adequate Speech Info:  (difficulty speaking)    SPECIAL CARE FACTORS FREQUENCY  PT (By licensed PT), OT (By licensed OT)     PT Frequency: 5x min weekly OT Frequency: 5x min weekly            Contractures Contractures Info: Not present    Additional Factors Info  Code Status, Allergies, Insulin  Sliding Scale Code Status Info: DNR Allergies Info: NKA   Insulin  Sliding Scale Info: Please see DC summary       Current Medications (08/24/2024):  This is the current hospital active medication list Current Facility-Administered Medications  Medication Dose Route Frequency Provider Last Rate Last Admin   acetaminophen  (TYLENOL ) tablet 650 mg  650 mg Oral Q6H PRN  Pahwani, Rinka R, MD   650 mg at 08/23/24 1304   Or   acetaminophen  (TYLENOL ) suppository 650 mg  650 mg Rectal Q6H PRN Pahwani, Rinka R, MD       albuterol  (PROVENTIL ) (2.5 MG/3ML) 0.083% nebulizer solution 2.5 mg  2.5 mg Nebulization Q4H PRN Pahwani, Rinka R, MD       ALPRAZolam  (XANAX ) tablet 0.5 mg  0.5 mg Oral TID PRN Jillian Buttery, MD       aspirin   chewable tablet 81 mg  81 mg Oral Daily Adhikari, Amrit, MD   81 mg at 08/24/24 1021   carvedilol  (COREG ) tablet 3.125 mg  3.125 mg Oral BID WC Garrick Christians N, PA-C   3.125 mg at 08/24/24 1021   clopidogrel  (PLAVIX ) tablet 75 mg  75 mg Oral Daily Adhikari, Buttery, MD   75 mg at 08/24/24 1020   ezetimibe  (ZETIA ) tablet 10 mg  10 mg Oral Daily Garrick Christians SAILOR, PA-C   10 mg at 08/24/24 1021   feeding supplement (ENSURE PLUS HIGH PROTEIN) liquid 237 mL  237 mL Oral TID BM Adhikari, Amrit, MD   237 mL at 08/23/24 2030   heparin  injection 5,000 Units  5,000 Units Subcutaneous Q8H Pahwani, Rinka R, MD   5,000 Units at 08/24/24 9372   hydrALAZINE  (APRESOLINE ) injection 10 mg  10 mg Intravenous Q6H PRN Pahwani, Rinka R, MD   10 mg at 08/21/24 1200   insulin  aspart (novoLOG ) injection 0-15 Units  0-15 Units Subcutaneous TID WC Pahwani, Rinka R, MD   5 Units at 08/24/24 1022   insulin  aspart (novoLOG ) injection 0-5 Units  0-5 Units Subcutaneous QHS Pahwani, Rinka R, MD   3 Units at 08/23/24 2258   insulin  glargine (LANTUS ) injection 12 Units  12 Units Subcutaneous Daily Jillian Buttery, MD   12 Units at 08/24/24 1021   multivitamin with minerals tablet 1 tablet  1 tablet Oral Daily Jillian Buttery, MD   1 tablet at 08/24/24 1020   ondansetron  (ZOFRAN ) tablet 4 mg  4 mg Oral Q6H PRN Pahwani, Rinka R, MD       Or   ondansetron  (ZOFRAN ) injection 4 mg  4 mg Intravenous Q6H PRN Pahwani, Rinka R, MD       oxyCODONE  (Oxy IR/ROXICODONE ) immediate release tablet 5 mg  5 mg Oral Q6H PRN Jillian Buttery, MD   5 mg at 08/23/24 2314   rosuvastatin  (CRESTOR ) tablet 40 mg  40 mg Oral QPM Jillian Buttery, MD   40 mg at 08/23/24 1642     Discharge Medications: Please see discharge summary for a list of discharge medications.  Relevant Imaging Results:  Relevant Lab Results:   Additional Information [id number]  Isaiah Public, LCSWA     "

## 2024-08-25 ENCOUNTER — Inpatient Hospital Stay (HOSPITAL_COMMUNITY)

## 2024-08-25 DIAGNOSIS — N1832 Chronic kidney disease, stage 3b: Secondary | ICD-10-CM | POA: Diagnosis present

## 2024-08-25 DIAGNOSIS — J9601 Acute respiratory failure with hypoxia: Secondary | ICD-10-CM | POA: Diagnosis not present

## 2024-08-25 DIAGNOSIS — N179 Acute kidney failure, unspecified: Secondary | ICD-10-CM

## 2024-08-25 LAB — GLUCOSE, CAPILLARY
Glucose-Capillary: 248 mg/dL — ABNORMAL HIGH (ref 70–99)
Glucose-Capillary: 238 mg/dL — ABNORMAL HIGH (ref 70–99)
Glucose-Capillary: 110 mg/dL — ABNORMAL HIGH (ref 70–99)
Glucose-Capillary: 233 mg/dL — ABNORMAL HIGH (ref 70–99)
Glucose-Capillary: 312 mg/dL — ABNORMAL HIGH (ref 70–99)

## 2024-08-25 LAB — URINALYSIS, ROUTINE W REFLEX MICROSCOPIC
Bacteria, UA: NONE SEEN
Bilirubin Urine: NEGATIVE
Glucose, UA: >=500 mg/dL — AB
Hgb urine dipstick: NEGATIVE
Ketones, ur: NEGATIVE mg/dL
Leukocytes,Ua: NEGATIVE
Nitrite: NEGATIVE
Protein, ur: >=300 mg/dL — AB
Specific Gravity, Urine: 1.01 (ref 1.005–1.030)
pH: 8 (ref 5.0–8.0)

## 2024-08-25 LAB — BASIC METABOLIC PANEL WITH GFR
Anion gap: 10 (ref 5–15)
BUN: 44 mg/dL — ABNORMAL HIGH (ref 6–20)
CO2: 25 mmol/L (ref 22–32)
Calcium: 7.6 mg/dL — ABNORMAL LOW (ref 8.9–10.3)
Chloride: 91 mmol/L — ABNORMAL LOW (ref 98–111)
Creatinine, Ser: 1.4 mg/dL — ABNORMAL HIGH (ref 0.44–1.00)
GFR, Estimated: 44 mL/min — ABNORMAL LOW
Glucose, Bld: 273 mg/dL — ABNORMAL HIGH (ref 70–99)
Potassium: 4.5 mmol/L (ref 3.5–5.1)
Sodium: 125 mmol/L — ABNORMAL LOW (ref 135–145)

## 2024-08-25 LAB — CBC
HCT: 31 % — ABNORMAL LOW (ref 36.0–46.0)
Hemoglobin: 10.2 g/dL — ABNORMAL LOW (ref 12.0–15.0)
MCH: 30.2 pg (ref 26.0–34.0)
MCHC: 32.9 g/dL (ref 30.0–36.0)
MCV: 91.7 fL (ref 80.0–100.0)
Platelets: 177 10*3/uL (ref 150–400)
RBC: 3.38 MIL/uL — ABNORMAL LOW (ref 3.87–5.11)
RDW: 15.8 % — ABNORMAL HIGH (ref 11.5–15.5)
WBC: 14.2 10*3/uL — ABNORMAL HIGH (ref 4.0–10.5)
nRBC: 0 % (ref 0.0–0.2)

## 2024-08-25 MED ORDER — SENNOSIDES-DOCUSATE SODIUM 8.6-50 MG PO TABS
1.0000 | ORAL_TABLET | Freq: Two times a day (BID) | ORAL | Status: DC
  Administered 2024-08-25 – 2024-08-29 (×8): 1 via ORAL
  Filled 2024-08-25 (×10): qty 1

## 2024-08-25 MED ORDER — BOOST PLUS PO LIQD
237.0000 mL | Freq: Three times a day (TID) | ORAL | Status: AC
  Administered 2024-08-25 – 2024-09-01 (×19): 237 mL via ORAL
  Filled 2024-08-25 (×24): qty 237

## 2024-08-25 MED ORDER — HYDROMORPHONE HCL 1 MG/ML IJ SOLN
0.5000 mg | INTRAMUSCULAR | Status: DC | PRN
  Administered 2024-08-25 – 2024-08-28 (×6): 0.5 mg via INTRAVENOUS
  Filled 2024-08-25 (×6): qty 1

## 2024-08-25 MED ORDER — SODIUM CHLORIDE 0.9 % IV SOLN: INTRAVENOUS | Status: AC

## 2024-08-25 MED ORDER — NICOTINE 21 MG/24HR TD PT24
21.0000 mg | MEDICATED_PATCH | Freq: Every day | TRANSDERMAL | Status: AC
  Administered 2024-08-25 – 2024-09-01 (×8): 21 mg via TRANSDERMAL
  Filled 2024-08-25 (×8): qty 1

## 2024-08-25 MED ORDER — SODIUM CHLORIDE 0.9 % IV SOLN
3.0000 g | Freq: Four times a day (QID) | INTRAVENOUS | Status: DC
  Administered 2024-08-25 – 2024-08-27 (×7): 3 g via INTRAVENOUS
  Filled 2024-08-25 (×7): qty 8

## 2024-08-25 MED ORDER — SPIRONOLACTONE 12.5 MG HALF TABLET
12.5000 mg | ORAL_TABLET | Freq: Every day | ORAL | Status: DC
  Administered 2024-08-25 – 2024-08-26 (×2): 12.5 mg via ORAL
  Filled 2024-08-25 (×2): qty 1

## 2024-08-25 MED ORDER — POLYETHYLENE GLYCOL 3350 17 G PO PACK
17.0000 g | PACK | Freq: Every day | ORAL | Status: DC
  Administered 2024-08-25 – 2024-08-27 (×3): 17 g via ORAL
  Filled 2024-08-25 (×3): qty 1

## 2024-08-25 NOTE — Plan of Care (Signed)
" °  Problem: Clinical Measurements: Goal: Will remain free from infection Outcome: Progressing   Problem: Clinical Measurements: Goal: Diagnostic test results will improve Outcome: Progressing   Problem: Education: Goal: Knowledge of General Education information will improve Description: Including pain rating scale, medication(s)/side effects and non-pharmacologic comfort measures Outcome: Progressing   Problem: Clinical Measurements: Goal: Respiratory complications will improve Outcome: Progressing   Problem: Elimination: Goal: Will not experience complications related to bowel motility Outcome: Progressing   Problem: Pain Managment: Goal: General experience of comfort will improve and/or be controlled Outcome: Progressing   Problem: Skin Integrity: Goal: Risk for impaired skin integrity will decrease Outcome: Progressing   Problem: Coping: Goal: Ability to adjust to condition or change in health will improve Outcome: Progressing   Problem: Skin Integrity: Goal: Risk for impaired skin integrity will decrease Outcome: Progressing   Problem: Education: Goal: Knowledge of disease or condition will improve Outcome: Progressing   "

## 2024-08-25 NOTE — Progress Notes (Addendum)
 New changes with patient's sacral wound noted this PM.  Notified by NT that he had noticed a significant foul odor when turning the patient for routine care.  Responded to room, turned patient, removed dressing and reassessed wound bed and surrounding area.  Noted moderate amount of purulent, brown, foul-smelling drainage on the dressing and emanating from the location of the sacral wound.  Wound bed does not appear grossly different on assessment from this AM.  Patient does endorse ongoing and increasing pain localized to the area.  Possibility of infection is high.  Attempted to get a new image for chart media, but was unable to get it to cross into the patient's chart for unknown reason.  Notified Dr. Jillian of findings.  Will continue to monitor.

## 2024-08-25 NOTE — Progress Notes (Addendum)
 "  Rounding Note   Patient Name: [name] Date of Encounter: 08/25/2024  [facility] Cardiologist: Jayson Sierras, MD   Subjective No acute overnight events. Her main complaints this morning are right sided flank pain and diffuse abdominal pain. She also reports some nausea but no vomiting. Spoke with RN and he said these complaints are not new. She denies any chest pain. She states she feels a little short of breath but looks comfortable.  Scheduled Meds:  aspirin   81 mg Oral Daily   carvedilol   3.125 mg Oral BID WC   clopidogrel   75 mg Oral Daily   ezetimibe   10 mg Oral Daily   feeding supplement  237 mL Oral TID BM   heparin   5,000 Units Subcutaneous Q8H   insulin  aspart  0-15 Units Subcutaneous TID WC   insulin  aspart  0-5 Units Subcutaneous QHS   insulin  glargine  12 Units Subcutaneous Daily   multivitamin with minerals  1 tablet Oral Daily   rosuvastatin   40 mg Oral QPM   Continuous Infusions:  PRN Meds: acetaminophen  **OR** acetaminophen , albuterol , ALPRAZolam , hydrALAZINE , ondansetron  **OR** ondansetron  (ZOFRAN ) IV, oxyCODONE    Vital Signs  Vitals:   08/24/24 1754 08/24/24 1930 08/25/24 0032 08/25/24 0431  BP: (!) 154/68 (!) 151/67 (!) 158/60 (!) 159/69  Pulse: 65 83 88 82  Resp: 17 20 (!) 22 18  Temp: 98.7 F (37.1 C) 98.4 F (36.9 C) 98.9 F (37.2 C) 98.4 F (36.9 C)  TempSrc: Oral Oral Oral Oral  SpO2:  98% 99% 99%  Weight:      Height:        Intake/Output Summary (Last 24 hours) at 08/25/2024 0635 Last data filed at 08/24/2024 1514 Gross per 24 hour  Intake --  Output 350 ml  Net -350 ml      08/24/2024    4:12 AM 08/23/2024    4:15 AM 08/22/2024    3:53 AM  Last 3 Weights  Weight (lbs) 139 lb 1.8 oz 139 lb 1.8 oz 138 lb 10.7 oz  Weight (kg) 63.1 kg 63.1 kg 62.9 kg      Telemetry Not on telemetry.  ECG  No new ECG tracing. - Personally Reviewed  Physical Exam  GEN: No acute distress.   Neck: No JVD. Cardiac: RRR. No  murmurs, rubs, or gallops.  Respiratory: Clear to auscultation bilaterally. No wheezes, rhonchi, or rales. MS: No lower extremity edema S/p bilateral above knee amputations. Neuro:  No focal deficits. Psych: Normal affect.  Labs High Sensitivity Troponin:  No results for input(s): TROPONINIHS in the last 720 hours. No results for input(s): TRNPT in the last 720 hours.     Chemistry Recent Labs  Lab 08/19/24 1839 08/19/24 1848 08/20/24 9687 08/21/24 0257 08/22/24 0430 08/23/24 0529 08/25/24 0409  NA 139  --    < > 145 146* 142 125*  K 4.5  --    < > 3.7 3.0* 3.8 4.5  CL 108  --    < > 105 103 103 91*  CO2 13*  --    < > 25 33* 30 25  GLUCOSE 259*  --    < > 194* 77 86 273*  BUN 98*  --    < > 83* 62* 51* 44*  CREATININE 3.96*  --    < > 2.81* 2.00* 1.72* 1.40*  CALCIUM  8.0*  --    < > 7.5* 7.5* 7.7* 7.6*  MG  --  2.3  --   --  2.3  --   --   PROT 5.5*  --   --  5.0*  --   --   --   ALBUMIN  3.1*  --   --  2.8*  --   --   --   AST 31  --   --  30  --   --   --   ALT 38  --   --  28  --   --   --   ALKPHOS 174*  --   --  138*  --   --   --   BILITOT 0.5  --   --  0.4  --   --   --   GFRNONAA 13*  --    < > 19* 29* 35* 44*  ANIONGAP 19*  --    < > 15 11 8 10    < > = values in this interval not displayed.    Lipids No results for input(s): CHOL, TRIG, HDL, LABVLDL, LDLCALC, CHOLHDL in the last 168 hours.  Hematology Recent Labs  Lab 08/20/24 0312 08/21/24 0257 08/25/24 0409  WBC 10.7* 8.9 14.2*  RBC 3.71* 3.61* 3.38*  HGB 11.2* 10.8* 10.2*  HCT 34.7* 33.9* 31.0*  MCV 93.5 93.9 91.7  MCH 30.2 29.9 30.2  MCHC 32.3 31.9 32.9  RDW 16.0* 15.9* 15.8*  PLT 138* 137* 177   Thyroid  No results for input(s): TSH, FREET4 in the last 168 hours.  BNP Recent Labs  Lab 08/19/24 1839  PROBNP >35,000.0*    DDimer No results for input(s): DDIMER in the last 168 hours.   Radiology  No results found.  Cardiac Studies  Complete Echocardiogram  08/07/2024: Impressions: 1. Left ventricular ejection fraction, by estimation, is 35 to 40%. The  left ventricle has mildly decreased function. The left ventricle  demonstrates global hypokinesis. Left ventricular diastolic parameters are  indeterminate.   2. Right ventricular systolic function is low normal. The right  ventricular size is mildly enlarged. Tricuspid regurgitation signal is  inadequate for assessing PA pressure.   3. Left atrial size was severely dilated.   4. A small pericardial effusion is present. The pericardial effusion is  circumferential.   5. Eccentric posterior mitral regurgitation that may be underestimated  due to eccentricity of jet. At least moderate mitral regurgitation. MV  VTI/AV VTI ratio is 1.1 suggesting moderate MR. The mitral valve is  abnormal. Moderate mitral valve  regurgitation. Mild mitral stenosis. The mean mitral valve gradient is 5.0  mmHg.   6. AVA VTI 1.3, mean gradient 5 mmHg, DI 0.46, SVI 26. Data would support  low flow low gradient moderate aortic stenosis. . The aortic valve is  tricuspid. There is moderate calcification of the aortic valve. There is  moderate thickening of the aortic  valve. Aortic valve regurgitation is moderate. Moderate aortic valve  stenosis.   7. The inferior vena cava is dilated in size with <50% respiratory  variability, suggesting right atrial pressure of 15 mmHg.  _______________  Limited Echocardiogram 08/20/2024: Impressions:  1. Left ventricular ejection fraction, by estimation, is 50 to 55%. Left  ventricular ejection fraction by 2D MOD biplane is 53.7 %. The left  ventricle has low normal function. The left ventricular internal cavity  size was moderately to severely dilated.   There is severe eccentric left ventricular hypertrophy.   2. The mitral valve is normal in structure. Mild mitral valve  regurgitation.   3. The aortic valve is normal in structure. Aortic  valve regurgitation is  mild.   4.  The inferior vena cava is normal in size with greater than 50%  respiratory variability, suggesting right atrial pressure of 3 mmHg.   Comparison(s): Prior images reviewed side by side. Limited for EF. EF has  improved. AI and MR improved and now mild.  _______________  Carotid Dopplers 08/20/2024: Summary: Right Carotid: Velocities in the right ICA are consistent with a 1-39%  stenosis.   Left Carotid: Velocities in the left ICA are consistent with a 40-59%  stenosis.   Vertebrals: Right vertebral artery demonstrates antegrade flow. Left  vertebral not assessed.  Subclavians: Normal flow hemodynamics were seen in bilateral subclavian  arteries.  _______________  Transcranial Doppler 08/20/2024: Summary: Absent left temporal window limits evaluation of anterior circulation on  the left. Mildly elevated right middle and anterior cerbral artery mean  flow velocitie sof unclear significance. Globally elevated pulsatility  indices suggests diffus eintracranial  atherosclerosis likely.   Patient Profile   54 y.o. female with a history of chronic HFrEF with EF of 35-40%, frequent PVCs, low flow low gradient moderate aortic stenosis  moderate mitral regurgitation, PAD s/p bilateral above knee amputations, carotid artery disease, COPD, prior PE,  hyperlipidemia, type 2 diabetes mellitus, and CKD stage III who was admitted at [facility] on 08/15/2024 with acute on chronic CHF and altered mental status. He subsequently developed stroke like symptoms and was transferred to [facility] on 08/19/2024 for further evaluation.  Assessment & Plan   Acute on Chronic HFrEF History of chronic HFrEF.  Recent patient Echo on 08/07/2024 showed LVEF of 35-40% with global hypokinesis.  [facility]BNP at [facility] was markedly elevated at > 70,000.  Repeat proBNP here at [facility] >35,000. Repeat limited Echo this admission showed EF of 50-55% with severe LVH and possible wall motion abnormalities in RCA  distribution, mild AI, and mild MR.  He was initially diuresed with IV Lasix   at [facility] but had worsening renal function with creatinine peaking at 4.2  He has not received any Lasix  since being here at [facility]. Creatinine continues to slowly improve. Weight is up 8 lbs since arrival to [facility]. - Euvolemic on exam.  - Continue Coreg  3.125mg  twice daily. - Home Entresto , Spironolactone , and Jardiance  all on hold due to AKI from initial over-diuresis. Renal function as now improved so we could like start to slowly restart these. Consider restarting Jardiance  first but will discuss with MD.  Elevated Troponin High-sensitivity troponin 70 >> 75 >> 128. Echo showed improvement of LVEF to 50-55% but regional wall motion abnormalities in RCA distribution. - No chest pain.  - Troponin elevation not consistent with ACS and secondary to demand ischemia. No plans for cardiac catheterization at this time given recent AKI and recent stroke. Can consider outpatient ischemic evaluation after she recovers from stroke.  Valvular Heart Disease Recent outpatient Echo on 08/11/2024 showed low flow low gradient moderate aortic stenosis/ moderate aortic insufficiency, moderate mitral regurgitation/ mild mitral stenosis. However, repeat limited Echo this admission showed only mild aortic insufficiency and mild mitral regurgitation. - Continue to monitor as an outpatient.   Frequent PVC History of frequent PVCs in the past. PVC ablation was attempted in 01/2024 but was aborted given very low frequency PVCs at that time even with Isoproterenol .  - Not on telemetry but no ectopy noted on exam. - Continue Coreg  3.125mg  twice daily. - She is not currently on telemetry. RN mentioned that she is awaiting bed placement at a SNF. If  she is not going to be discharged today, would recommend putting her back on telemetry given her acute stroke this admission.  PAD S/p bilateral above knee amputations.  - Continue DAPT with  Aspirin  81mg  daily and Plavix  75mg  daily. - Continue Crestor  40mg  daily and Zetia  10mg  daily.  Carotid Artery Disease Carotid dopplers this admission showed 40-59% stenosis of left ICA and 1-39% stenosis of right ICA. - Continue DAPT and Crestor / Zetia  as above.  Hypertension BP mildly elevated with systolic BP in the 140s-150s.  - Continue Coreg  3.125mg  twice daily.  - Home Entresto  and Spironolactone  on hold due to AKI. But creatinine has normalized so may be able to restart one of these. Will discuss with MD.  Hyperlipidemia  Lipid panel at [facility]: Total Cholesterol 166, Triglycerides 66, HDL 36, LDL 119. LDL goal <55.  - Continue Crestor  40mg  daily. - Continue Zetia  10mg  daily (started this admission). - Will need repeat lipid panel and LFTs in 6-8 weeks.  - Would benefit from referral to lipid clinic for PCSK9 inhibitor.  AKI on CKD Stage III Creatinine was 2.03 on arrival to [facility] and peaked at 4.2. Baseline creatinine around 1.7 to 2.3. Seen by Nephrology and was treated with Bicarb. AKI felt to be secondary to overdiuresis +/- Entresto . - Creatinine has been gradually improving and is 1.4 today.  Acute Ischemic Stroke MRI showed scattered infarcts including bilateral frontal, parietal, occipital, basal ganglia, left thalamus and cerebellum. Suspected embolic phenomena.  - Continue DAPT with Aspirin  and Plavix .  - Continue Crestor / Zetia . - Will need outpatient 30 day event monitor at discharge.  - Neurology has signed off.  Hyponatremia Sodium 125 today. Has been running in the 140s. Question whether this is accurate. - Management per primary team.  Otherwise, per primary team: - Type 2 diabetes mellitus - Anemia of chronic disease - Altered mental status - Dysphagia  - Sacral ulcer - Leukocytosis - Abdominal pain/ flank pain - Nausea   For questions or updates, please contact [facility] Please consult [email or web address] for contact info  under     Signed, Callie E Goodrich, PA-C  08/25/2024, 6:35 AM    Patient seen and examined.  Agree with above documentation.  On exam, patient is somnolent but arousable, regular rate and rhythm, no murmurs, lungs CTAB, no JVD, s/p bilateral BKA.  She appears euvolemic.  Her Entresto , spironolactone , and Jardiance  are on hold due to AKI.  Renal function has improved.  BP elevated.  Will add back spironolactone .  Lonni LITTIE Nanas, MD    "

## 2024-08-25 NOTE — Progress Notes (Signed)
 Nutrition Follow-up  DOCUMENTATION CODES:  Not applicable  INTERVENTION:  Continue dysphagia 3 diet with thin liquids per SLP Change PO supplement to Boost Plus PO TID, each supplement provides 360 kcal and 14 gm protein Continue Magic cup TID with meals, each supplement provides 290 kcal and 9 grams of protein Continue MVI with minerals daily  NUTRITION DIAGNOSIS:  Inadequate oral intake related to decreased appetite as evidenced by meal completion < 50%, per patient/family report.  GOAL:  Patient will meet greater than or equal to 90% of their needs  MONITOR:  PO intake, Supplement acceptance  REASON FOR ASSESSMENT:  Consult Assessment of nutrition requirement/status  ASSESSMENT:  54 yo female admitted with renal failure, confusion. CT head showed right caudate infarct. PMH includes hypertension, diabetes, heart failure with EF 35 to 40%, PAD status post bilateral AKA, pulmonary embolism, anemia, asthma, B12 deficiency, CKD stage 3, COPD, fatty liver, folate deficiency, HF, vitamin D deficiency, PVC.  Spoke with patient and her family at bedside. They report that patient typically has a good appetite and eats well. Intake since admission has been poor d/t pain and decreased appetite. She is eating 20-50% of her meals. She likes chocolate supplements, will change to Boost Plus since chocolate Ensure Plus High Protein is on backorder.   Usual weight: 61.5 kg (09/03/23) Admit weight: 59.5 kg Current weight: 67.5 kg  Nutritionally Relevant Medications: Scheduled Meds:  aspirin   81 mg Oral Daily   carvedilol   3.125 mg Oral BID WC   clopidogrel   75 mg Oral Daily   ezetimibe   10 mg Oral Daily   heparin   5,000 Units Subcutaneous Q8H   insulin  aspart  0-15 Units Subcutaneous TID WC   insulin  aspart  0-5 Units Subcutaneous QHS   insulin  glargine  12 Units Subcutaneous Daily   lactose free nutrition  237 mL Oral TID WC   multivitamin with minerals  1 tablet Oral Daily   nicotine   21  mg Transdermal Daily   polyethylene glycol  17 g Oral Daily   rosuvastatin   40 mg Oral QPM   senna-docusate  1 tablet Oral BID   spironolactone   12.5 mg Oral Daily   Continuous Infusions:  sodium chloride  Stopped (08/25/24 1110)   PRN Meds:.acetaminophen  **OR** acetaminophen , albuterol , ALPRAZolam , hydrALAZINE , HYDROmorphone  (DILAUDID ) injection, ondansetron  **OR** ondansetron  (ZOFRAN ) IV, oxyCODONE   Labs Reviewed: Phosphorus 6.1 (08/19/24) CBG ranges from 110-290 mg/dL over the last 24 hours HgbA1c 7.6 (08/21/23)   NUTRITION - FOCUSED PHYSICAL EXAM: Flowsheet Row Most Recent Value  Orbital Region No depletion  Upper Arm Region No depletion  Thoracic and Lumbar Region No depletion  Buccal Region No depletion  Temple Region No depletion  Clavicle Bone Region No depletion  Clavicle and Acromion Bone Region No depletion  Scapular Bone Region No depletion  Dorsal Hand No depletion  Patellar Region Unable to assess  Anterior Thigh Region Unable to assess  Posterior Calf Region Unable to assess  Edema (RD Assessment) None  Hair Reviewed  Eyes Reviewed  Mouth Reviewed  Skin Reviewed  Nails Reviewed    Diet Order:   Diet Order             DIET DYS 3 Room service appropriate? Yes with Assist; Fluid consistency: Thin  Diet effective now                  EDUCATION NEEDS:  Not appropriate for education at this time  Skin:  Skin Assessment: Skin Integrity Issues: Skin Integrity Issues:: DTI  DTI: sacrum  Last BM:  1/29  Height:  Ht Readings from Last 1 Encounters:  08/21/24 5' 8 (1.727 m)  5' 8 prior to bil AKA  Weight:  Wt Readings from Last 1 Encounters:  08/25/24 67.5 kg   Ideal Body Weight:  53.5 kg (adjusted for above the knee amputations)  Estimated Nutritional Needs:  Kcal:  1500-1700 Protein:  70-85 gm Fluid:  1.5-1.7 L   Suzen HUNT RD, LDN, CNSC Contact via secure chat. If unavailable, use group chat RD Inpatient.

## 2024-08-25 NOTE — Progress Notes (Signed)
 Physical Therapy Treatment Patient Details Name: [name] MRN: [id number] DOB: 08-24-1970 Today's Date: 08/25/2024   History of Present Illness 54 y.o. female presents to [facility] 08/19/24 from [facility] for evaluation of progressive renal failure. Pt also with AMS w/ L sided weakness and acute on chronic CHF exacerbation. MRI brain showed multiple acute infarcts in bilateral frontal, parietal and occipital lobes, right basal ganglia, left thalamus and left cerebellum, possibly embolic. PMHx: HFrEF (30-35%), PVCs, tobacco abuse, HTN, PAD, bilateral AKA, CKD III, HLD, PVD    PT Comments  Pt received in supine and agreeable to PT/OT co-treat to progress acute therapy goals. Pt continues to require MaxAx2 for bed mobility. Pt would have occasional losses of balance to the right and posterior requiring up to MaxA to correct. Worked on dynamic balance with pt reaching for cup and washing face with 1UE to support. Pt reported I don't feel good with increased fatigue, vitals stable. Pt requested to return to supine with MaxAx2 required. New recommendation for <3hrs post acute rehab due to decreased activity tolerance. Will continue to follow acutely.   If plan is discharge home, recommend the following: A lot of help with walking and/or transfers;A lot of help with bathing/dressing/bathroom;Assist for transportation;Help with stairs or ramp for entrance;Assistance with cooking/housework   Can travel by private vehicle     No  Equipment Recommendations  Hoyer lift       Precautions / Restrictions Precautions Precautions: Fall;Other (comment) Recall of Precautions/Restrictions: Impaired Precaution/Restrictions Comments: foley, h/o bilat AKAs Restrictions Weight Bearing Restrictions Per Provider Order: No     Mobility  Bed Mobility Overal bed mobility: Needs Assistance Bed Mobility: Rolling, Sidelying to Sit, Sit to Sidelying Rolling: Used rails, Mod assist Sidelying to sit: Max assist,  +2 for physical assistance, +2 for safety/equipment, Used rails    Sit to sidelying: Max assist, +2 for physical assistance General bed mobility comments: assistance with trunk and scooting towards EOB    Transfers Overall transfer level: Needs assistance Equipment used: None Transfers: Bed to chair/wheelchair/BSC     Lateral/Scoot Transfers: Max assist, +2 physical assistance General transfer comment: lateral scooting towards right with use of bed pad and max assist +2       Balance Overall balance assessment: Needs assistance Sitting-balance support: Bilateral upper extremity supported, Feet unsupported Sitting balance-Leahy Scale: Poor Sitting balance - Comments: max assist initially and progressed to min/CGA Postural control: Right lateral lean, Posterior lean     Communication Communication Communication: Impaired Factors Affecting Communication: Difficulty expressing self  Cognition Arousal: Alert Behavior During Therapy: Flat affect   PT - Cognitive impairments: No family/caregiver present to determine baseline, Attention, Sequencing, Problem solving, Safety/Judgement    PT - Cognition Comments: Improvements in orientation with pt able to engage in conversation. Increased time to process with word finding difficulties Following commands: Impaired Following commands impaired: Follows one step commands inconsistently, Follows one step commands with increased time    Cueing Cueing Techniques: Gestural cues, Verbal cues, Tactile cues     General Comments General comments (skin integrity, edema, etc.): Patient became more lethargic at end of session      Pertinent Vitals/Pain Pain Assessment Pain Assessment: Faces Faces Pain Scale: Hurts even more Pain Location: R side and bottom Pain Descriptors / Indicators: Discomfort, Moaning Pain Intervention(s): Limited activity within patient's tolerance, Monitored during session, Repositioned     PT Goals (current goals  can now be found in the care plan section) Acute Rehab PT Goals PT Goal  Formulation: Patient unable to participate in goal setting Time For Goal Achievement: 09/03/24 Potential to Achieve Goals: Fair Progress towards PT goals: Progressing toward goals    Frequency    Min 2X/week           Co-evaluation   Reason for Co-Treatment: Complexity of the patient's impairments (multi-system involvement);Necessary to address cognition/behavior during functional activity;For patient/therapist safety;To address functional/ADL transfers PT goals addressed during session: Mobility/safety with mobility;Balance;Strengthening/ROM OT goals addressed during session: ADL's and self-care      AM-PAC PT 6 Clicks Mobility   Outcome Measure  Help needed turning from your back to your side while in a flat bed without using bedrails?: A Lot Help needed moving from lying on your back to sitting on the side of a flat bed without using bedrails?: Total Help needed moving to and from a bed to a chair (including a wheelchair)?: Total Help needed standing up from a chair using your arms (e.g., wheelchair or bedside chair)?: Total Help needed to walk in hospital room?: Total Help needed climbing 3-5 steps with a railing? : Total 6 Click Score: 7    End of Session   Activity Tolerance: Patient limited by fatigue Patient left: in bed;with call bell/phone within reach;with bed alarm set Nurse Communication: Mobility status PT Visit Diagnosis: Unsteadiness on feet (R26.81);Other abnormalities of gait and mobility (R26.89);Muscle weakness (generalized) (M62.81);Hemiplegia and hemiparesis Hemiplegia - Right/Left: Left Hemiplegia - caused by: Cerebral infarction     Time: 9076-9057 PT Time Calculation (min) (ACUTE ONLY): 19 min  Charges:      PT General Charges $$ ACUTE PT VISIT: 1 Visit                    Kate ORN, PT, DPT Secure Chat Preferred  Rehab Office [phone]   Kate BRAVO  Wendolyn 08/25/2024, 1:09 PM

## 2024-08-25 NOTE — TOC Progression Note (Signed)
 Transition of Care [facility]) - Progression Note    Patient Details  Name: [name] MRN: [id number] Date of Birth: January 29, 1971  [facility] CM/SW Contact  Isaiah Public, LCSWA Phone Number: 08/25/2024, 10:25 AM  Clinical Narrative:     CSW spoke with Jon with [facility] who confirmed insurance authorization currently pending for SNF. CSW will continue to follow.  Expected Discharge Plan: IP Rehab Facility                 Expected Discharge Plan and Services   Discharge Planning Services: CM Consult Post Acute Care Choice: IP Rehab Living arrangements for the past 2 months: Single Family Home                   DME Agency: NA                   Social Drivers of Health (SDOH) Interventions SDOH Screenings   Food Insecurity: No Food Insecurity (08/22/2024)  Housing: Low Risk (08/22/2024)  Transportation Needs: No Transportation Needs (08/22/2024)  Utilities: Not At Risk (08/22/2024)  Alcohol  Screen: Low Risk (03/18/2022)  Depression (PHQ2-9): Medium Risk (03/18/2022)  Financial Resource Strain: Low Risk (08/16/2024)   Received from [facility]  Physical Activity: Inactive (08/16/2024)   Received from [facility]  Social Connections: Socially Isolated (08/16/2024)   Received from [facility]  Stress: No Stress Concern Present (08/16/2024)   Received from [facility]  Tobacco Use: High Risk (08/22/2024)  Health Literacy: Medium Risk (08/16/2024)   Received from [facility]    Readmission Risk Interventions     No data to display

## 2024-08-25 NOTE — Progress Notes (Addendum)
 Occupational Therapy Treatment Patient Details Name: [name] MRN: [id number] DOB: 1971/04/13 Today's Date: 08/25/2024   History of present illness 54 y.o. female presents to [facility] 08/19/24 from [facility] for evaluation of progressive renal failure. Pt also with AMS w/ L sided weakness and acute on chronic CHF exacerbation. MRI brain showed multiple acute infarcts in bilateral frontal, parietal and occipital lobes, right basal ganglia, left thalamus and left cerebellum, possibly embolic. PMHx: HFrEF (30-35%), PVCs, tobacco abuse, HTN, PAD, bilateral AKA, CKD III, HLD, PVD   OT comments  Patient received in supine and agreeable to OT/PT treatment. Patient was instructed on bed mobility and required max assist +2 to get to EOB. Once on EOB patient required max assist for sitting balance but progressed to min/CGA. Patient stating she was fearful of falling and felt more secure with someone sitting in front of her. Patient able to manage cup and wash face with min assist while on EOB. Patient became more lethargic at end of session and required max assist +2 to return to supine.       If plan is discharge home, recommend the following:  Two people to help with bathing/dressing/bathroom;Two people to help with walking and/or transfers;Assistance with cooking/housework;Assist for transportation   Equipment Recommendations  Other (comment) (TBD at next venue)    Recommendations for Other Services      Precautions / Restrictions Precautions Precautions: Fall;Other (comment) Recall of Precautions/Restrictions: Impaired Precaution/Restrictions Comments: foley, h/o bilat AKAs Restrictions Weight Bearing Restrictions Per Provider Order: No       Mobility Bed Mobility Overal bed mobility: Needs Assistance Bed Mobility: Rolling, Sidelying to Sit, Sit to Sidelying Rolling: Used rails, Mod assist Sidelying to sit: Max assist, +2 for physical assistance, +2 for safety/equipment, Used  rails     Sit to sidelying: Max assist, +2 for physical assistance General bed mobility comments: assistance with trunk and scooting towards EOB    Transfers Overall transfer level: Needs assistance Equipment used: None Transfers: Bed to chair/wheelchair/BSC            Lateral/Scoot Transfers: Max assist, +2 physical assistance General transfer comment: lateral scooting towards rigth with use of bed pad and max assist +2     Balance Overall balance assessment: Needs assistance Sitting-balance support: Bilateral upper extremity supported, Feet unsupported Sitting balance-Leahy Scale: Poor Sitting balance - Comments: max assist initiatlly and progressed to min/CGA Postural control: Right lateral lean, Posterior lean                                 ADL either performed or assessed with clinical judgement   ADL Overall ADL's : Needs assistance/impaired Eating/Feeding: Minimal assistance;Sitting Eating/Feeding Details (indicate cue type and reason): to manage cup Grooming: Wash/dry face;Minimal assistance;Sitting Grooming Details (indicate cue type and reason): on EOB                                    Extremity/Trunk Assessment              Vision       Perception     Praxis     Communication Communication Communication: Impaired Factors Affecting Communication: Difficulty expressing self   Cognition Arousal: Alert Behavior During Therapy: Flat affect Cognition: History of cognitive impairments  Following commands: Impaired Following commands impaired: Follows one step commands inconsistently, Follows one step commands with increased time      Cueing   Cueing Techniques: Gestural cues, Verbal cues, Tactile cues  Exercises      Shoulder Instructions       General Comments Patient became more lethargic at end of session    Pertinent Vitals/ Pain       Pain Assessment Pain  Assessment: Faces Faces Pain Scale: Hurts even more Pain Location: R side and bottom Pain Descriptors / Indicators: Discomfort, Moaning Pain Intervention(s): Limited activity within patient's tolerance, Monitored during session, Repositioned  Home Living                                          Prior Functioning/Environment              Frequency  Min 2X/week        Progress Toward Goals  OT Goals(current goals can now be found in the care plan section)  Progress towards OT goals: Progressing toward goals  Acute Rehab OT Goals Patient Stated Goal: to feel better OT Goal Formulation: With patient Time For Goal Achievement: 09/03/24 Potential to Achieve Goals: Good  Plan      Co-evaluation    PT/OT/SLP Co-Evaluation/Treatment: Yes Reason for Co-Treatment: Complexity of the patient's impairments (multi-system involvement);Necessary to address cognition/behavior during functional activity;For patient/therapist safety;To address functional/ADL transfers   OT goals addressed during session: ADL's and self-care      AM-PAC OT 6 Clicks Daily Activity     Outcome Measure   Help from another person eating meals?: A Little Help from another person taking care of personal grooming?: A Lot Help from another person toileting, which includes using toliet, bedpan, or urinal?: Total Help from another person bathing (including washing, rinsing, drying)?: A Lot Help from another person to put on and taking off regular upper body clothing?: A Lot Help from another person to put on and taking off regular lower body clothing?: Total 6 Click Score: 11    End of Session    OT Visit Diagnosis: Unsteadiness on feet (R26.81);Other abnormalities of gait and mobility (R26.89);Muscle weakness (generalized) (M62.81);Cognitive communication deficit (R41.841);Other symptoms and signs involving cognitive function Symptoms and signs involving cognitive functions: Cerebral  infarction   Activity Tolerance Patient tolerated treatment well;Patient limited by lethargy   Patient Left in bed;with call bell/phone within reach;with bed alarm set   Nurse Communication Mobility status        Time: 9076-9057 OT Time Calculation (min): 19 min  Charges: OT General Charges $OT Visit: 1 Visit OT Treatments $Self Care/Home Management : 8-22 mins  Dick Laine, OTA Acute Rehabilitation Services  Office [phone]   Jeb LITTIE Laine 08/25/2024, 12:35 PM

## 2024-08-25 NOTE — Progress Notes (Addendum)
 " PROGRESS NOTE  [name]  FMW:[id number] DOB: 08-09-1970 DOA: 08/19/2024 PCP: Jeanette Comer BRAVO, PA-C   Brief Narrative: Patient is a 54 year old female with history of HFrEF, PVCs, tobacco use, hypertension, peripheral artery disease, bilateral AKA, CKD stage IIIb, hyperlipidemia who was transferred from [facility] for  further management of progressive renal failure.  She was initially admitted there on 1/20 and was currently being managed for acute on chronic CHF exacerbation, AMS, left-sided weakness, concern for stroke, progressive renal failure, elevated troponin.  On presentation to there, she was confused, code stroke was called.  CT head showed focal hypodensity in the right caudate body and adjacent white matter concerning for stroke.  She continued to decline and was requested to transfer here.  MRI showed acute ischemic stroke.  Workup completed.  Nephrology, neurology, cardiology, palliative care were following.  PT recommending AIR on discharge.  But now the plan is for SnF.TOC following  Assessment & Plan:  Principal Problem:   Acute hypoxemic respiratory failure (HCC) Active Problems:   Acute HFrEF (heart failure with reduced ejection fraction) (HCC)   Cerebrovascular accident (CVA) due to embolism of cerebral artery (HCC)   Status post below-knee amputation of both lower extremities (HCC)  Acute ischemic stroke: MRI showed scattered infarcts including bilateral frontal, parietal, occipital, basal ganglia, left thalamus and cerebellum.  Suspected embolic phenomena.  Neurology following.  Currently on aspirin , statin, Plavix , PT/OT/speech consulted.  PT recommended AIR.  TEE deferred for now. She has global weakness.  Obeys commands .  Alert and awake, oriented to place.    Altered mentation/dysphagia: Suspected to be from a stroke versus seizure.  Was started on Keppra at [facility], now not on keppra Mentation has improved.  Currently alert and awake, oriented to  place.On dysphagia 3 diet  Hyponatremia: Unclear etiology.  She appears euvolemic.  Will try gentle normal saline today.  Check BMP tomorrow  Leukocytosis/right upper quadrant pain: UA does not support UTI.  She was complaining of lower chest, right upper quadrant pain.  Abdomen is soft and nondistended, mostly nontender.  Will check CT abdomen/pelvis without contrast, chest x-ray  Acute on chronic HFrEF: Echo done on 08/07/2024 showed EF of 35 to 40%.  Repeat echo done here showed EF of 50 to 55%, low normal left ventricular function, severely dilated left ventricular cavity, eccentric left ventricular hypertrophy, mild mitral valve regurgitation .cardiology already consulted and following.  BNP was elevated.  Currently appears euvolemic.  Earlier she was on Lasix , Aldactone , Entresto , currently on hold due to worsening renal function.  Currently on Coreg   AKI in CKD stage IIIb/AGMA: Creatinine was elevated up to 4.2 now better than baseline.  Looks like her baseline creatinine is around 2.3.  Nephrology signed off  Hypertension:   On  coreg .  Continue as needed medications for severe hypertension.   History of coronary artery disease/elevated troponin: Troponin elevation likely from worsening renal function and heart failure.  On aspirin , Plavix , statin.  Cardiology planning for further evaluation of her coronary arteries after she clinically improves  History of peripheral artery disease/carotid artery stenosis: Status post bilateral BKA  Uncontrolled diabetes type 2: Last A1c noted to be 10.1.  Currently on sliding scale.  Monitor blood sugars.  Diabetic coordinator consulted.  She takes insulin  at home.  Restarted Lantus  at 12 units,Daily. Continue to  adjust dose as appropriate.  Anemia of chronic disease: In the setting of CKD.  Currently hemoglobin stable  Hyperlipidemia: On statin,zetia .  LDL of  119  Goals of care: Patient with multiple comorbidities now with altered mentation,  significant acute ischemic stroke, renal failure.  CODE STATUS DNR/DNI.  Palliative care was consulted for goals of care.  She has bilateral AKA.    Wound 08/19/24 Pressure Injury Sacrum Deep Tissue Pressure Injury - Purple or maroon localized area of discolored intact skin or blood-filled blister due to damage of underlying soft tissue from pressure and/or shear. (Active)  Addendum:  RN alert that the sacral decubitus wound has some drainage and foul smell. She has has developed new leucocytosis. We will start on unasyn  and request wound care consult.    Nutrition Problem: Inadequate oral intake Etiology: dysphagia Wound 08/19/24 Pressure Injury Sacrum Deep Tissue Pressure Injury - Purple or maroon localized area of discolored intact skin or blood-filled blister due to damage of underlying soft tissue from pressure and/or shear. (Active)    DVT prophylaxis:heparin  injection 5,000 Units Start: 08/19/24 2200 SCDs Start: 08/19/24 1749     Code Status: Limited: Do not attempt resuscitation (DNR) -DNR-LIMITED -Do Not Intubate/DNI   Family Communication:Called and discussed with sister [name] on phone on 1/26  Patient status:Inpatient  Patient is from :home  Anticipated discharge to:SNF  Estimated DC date: 1-2 days   Consultants: Cardiology, nephrology, neurology  Procedures: None yet  Antimicrobials:  Anti-infectives (From admission, onward)    None       Subjective: Patient seen and examined at bedside today.  Hemodynamically stable.  Sitting on the bed.  She complains of right upper quadrant pain today.  She was complaining of right lower chest pain yesterday.  Abdomen is soft and nondistended with good bowel sounds.  Remains alert and awake, oriented to place.  Objective: Vitals:   08/24/24 1930 08/25/24 0032 08/25/24 0431 08/25/24 0801  BP: (!) 151/67 (!) 158/60 (!) 159/69 (!) 155/72  Pulse: 83 88 82 88  Resp: 20 (!) 22 18 16   Temp: 98.4 F (36.9 C) 98.9 F (37.2  C) 98.4 F (36.9 C) 98.8 F (37.1 C)  TempSrc: Oral Oral Oral Oral  SpO2: 98% 99% 99% 98%  Weight:   67.5 kg   Height:        Intake/Output Summary (Last 24 hours) at 08/25/2024 1130 Last data filed at 08/25/2024 1025 Gross per 24 hour  Intake --  Output 1150 ml  Net -1150 ml   Filed Weights   08/23/24 0415 08/24/24 0412 08/25/24 0431  Weight: 63.1 kg 63.1 kg 67.5 kg    Examination:    General exam: Overall comfortable, not in distress, chronically deconditioned HEENT: PERRL Respiratory system:  no wheezes or crackles  Cardiovascular system: S1 & S2 heard, RRR.  Gastrointestinal system: Abdomen is nondistended, soft and nontender. Central nervous system: Alert and awake, obeys commands, moves upper extremities, oriented to place Extremities: No edema, no clubbing ,no cyanosis, bilateral AKA Skin: No rashes, no ulcers,no icterus     Data Reviewed: I have personally reviewed following labs and imaging studies  CBC: Recent Labs  Lab 08/19/24 1839 08/20/24 0312 08/21/24 0257 08/25/24 0409  WBC 11.5* 10.7* 8.9 14.2*  NEUTROABS 8.8*  --   --   --   HGB 11.1* 11.2* 10.8* 10.2*  HCT 34.6* 34.7* 33.9* 31.0*  MCV 95.3 93.5 93.9 91.7  PLT 154 138* 137* 177   Basic Metabolic Panel: Recent Labs  Lab 08/19/24 1848 08/20/24 0312 08/21/24 0257 08/22/24 0430 08/23/24 0529 08/25/24 0409  NA  --  142 145 146* 142 125*  K  --  4.2 3.7 3.0* 3.8 4.5  CL  --  110 105 103 103 91*  CO2  --  15* 25 33* 30 25  GLUCOSE  --  192* 194* 77 86 273*  BUN  --  96* 83* 62* 51* 44*  CREATININE  --  3.72* 2.81* 2.00* 1.72* 1.40*  CALCIUM   --  7.8* 7.5* 7.5* 7.7* 7.6*  MG 2.3  --   --  2.3  --   --   PHOS 6.1*  --   --   --   --   --      Recent Results (from the past 240 hours)  Culture, blood (Routine X 2) w Reflex to ID Panel     Status: None   Collection Time: 08/19/24  6:39 PM   Specimen: BLOOD LEFT ARM  Result Value Ref Range Status   Specimen Description BLOOD LEFT ARM   Final   Special Requests   Final    BOTTLES DRAWN AEROBIC AND ANAEROBIC Blood Culture results may not be optimal due to an inadequate volume of blood received in culture bottles   Culture   Final    NO GROWTH 5 DAYS Performed at [facility], [address] [place], [name] [address]    Report Status 08/24/2024 FINAL  Final  Culture, blood (Routine X 2) w Reflex to ID Panel     Status: None   Collection Time: 08/19/24  6:42 PM   Specimen: BLOOD RIGHT HAND  Result Value Ref Range Status   Specimen Description BLOOD RIGHT HAND  Final   Special Requests   Final    BOTTLES DRAWN AEROBIC AND ANAEROBIC Blood Culture adequate volume   Culture   Final    NO GROWTH 5 DAYS Performed at [facility], [address], [place], [name] [address]    Report Status 08/24/2024 FINAL  Final     Radiology Studies: No results found.   Scheduled Meds:  aspirin   81 mg Oral Daily   carvedilol   3.125 mg Oral BID WC   clopidogrel   75 mg Oral Daily   ezetimibe   10 mg Oral Daily   feeding supplement  237 mL Oral TID BM   heparin   5,000 Units Subcutaneous Q8H   insulin  aspart  0-15 Units Subcutaneous TID WC   insulin  aspart  0-5 Units Subcutaneous QHS   insulin  glargine  12 Units Subcutaneous Daily   multivitamin with minerals  1 tablet Oral Daily   polyethylene glycol  17 g Oral Daily   rosuvastatin   40 mg Oral QPM   senna-docusate  1 tablet Oral BID   Continuous Infusions:  sodium chloride  Stopped (08/25/24 1110)      LOS: 6 days   Ivonne Mustache, MD [facility] P1/30/2026, 11:30 AM  "

## 2024-08-25 NOTE — Plan of Care (Signed)
" °  Problem: Education: Goal: Knowledge of General Education information will improve Description: Including pain rating scale, medication(s)/side effects and non-pharmacologic comfort measures Outcome: Progressing   Problem: Health Behavior/Discharge Planning: Goal: Ability to manage health-related needs will improve Outcome: Progressing   Problem: Clinical Measurements: Goal: Ability to maintain clinical measurements within normal limits will improve Outcome: Progressing Goal: Will remain free from infection Outcome: Progressing Goal: Diagnostic test results will improve Outcome: Progressing Goal: Respiratory complications will improve Outcome: Progressing Goal: Cardiovascular complication will be avoided Outcome: Progressing   Problem: Activity: Goal: Risk for activity intolerance will decrease Outcome: Progressing   Problem: Nutrition: Goal: Adequate nutrition will be maintained Outcome: Progressing   Problem: Coping: Goal: Level of anxiety will decrease Outcome: Progressing   Problem: Elimination: Goal: Will not experience complications related to bowel motility Outcome: Progressing Goal: Will not experience complications related to urinary retention Outcome: Progressing   Problem: Pain Managment: Goal: General experience of comfort will improve and/or be controlled Outcome: Progressing   Problem: Safety: Goal: Ability to remain free from injury will improve Outcome: Progressing   Problem: Skin Integrity: Goal: Risk for impaired skin integrity will decrease Outcome: Progressing   Problem: Education: Goal: Ability to describe self-care measures that may prevent or decrease complications (Diabetes Survival Skills Education) will improve Outcome: Progressing Goal: Individualized Educational Video(s) Outcome: Progressing   Problem: Coping: Goal: Ability to adjust to condition or change in health will improve Outcome: Progressing   Problem: Health  Behavior/Discharge Planning: Goal: Ability to identify and utilize available resources and services will improve Outcome: Progressing Goal: Ability to manage health-related needs will improve Outcome: Progressing   Problem: Metabolic: Goal: Ability to maintain appropriate glucose levels will improve Outcome: Progressing   Problem: Nutritional: Goal: Maintenance of adequate nutrition will improve Outcome: Progressing Goal: Progress toward achieving an optimal weight will improve Outcome: Progressing   Problem: Skin Integrity: Goal: Risk for impaired skin integrity will decrease Outcome: Progressing   Problem: Tissue Perfusion: Goal: Adequacy of tissue perfusion will improve Outcome: Progressing   Problem: Education: Goal: Knowledge of disease or condition will improve Outcome: Progressing Goal: Knowledge of secondary prevention will improve (MUST DOCUMENT ALL) Outcome: Progressing Goal: Knowledge of patient specific risk factors will improve (DELETE if not current risk factor) Outcome: Progressing   Problem: Ischemic Stroke/TIA Tissue Perfusion: Goal: Complications of ischemic stroke/TIA will be minimized Outcome: Progressing   Problem: Coping: Goal: Will verbalize positive feelings about self Outcome: Progressing Goal: Will identify appropriate support needs Outcome: Progressing   Problem: Health Behavior/Discharge Planning: Goal: Ability to manage health-related needs will improve Outcome: Progressing Goal: Goals will be collaboratively established with patient/family Outcome: Progressing   Problem: Self-Care: Goal: Ability to participate in self-care as condition permits will improve Outcome: Progressing Goal: Verbalization of feelings and concerns over difficulty with self-care will improve Outcome: Progressing Goal: Ability to communicate needs accurately will improve Outcome: Progressing   Problem: Nutrition: Goal: Risk of aspiration will  decrease Outcome: Progressing Goal: Dietary intake will improve Outcome: Progressing   "

## 2024-08-26 DIAGNOSIS — Z89512 Acquired absence of left leg below knee: Secondary | ICD-10-CM

## 2024-08-26 DIAGNOSIS — Z89511 Acquired absence of right leg below knee: Secondary | ICD-10-CM

## 2024-08-26 DIAGNOSIS — J9601 Acute respiratory failure with hypoxia: Secondary | ICD-10-CM | POA: Diagnosis not present

## 2024-08-26 DIAGNOSIS — I5021 Acute systolic (congestive) heart failure: Secondary | ICD-10-CM | POA: Diagnosis not present

## 2024-08-26 LAB — BASIC METABOLIC PANEL WITH GFR
Anion gap: 10 (ref 5–15)
BUN: 41 mg/dL — ABNORMAL HIGH (ref 6–20)
CO2: 23 mmol/L (ref 22–32)
Calcium: 7.6 mg/dL — ABNORMAL LOW (ref 8.9–10.3)
Chloride: 90 mmol/L — ABNORMAL LOW (ref 98–111)
Creatinine, Ser: 1.36 mg/dL — ABNORMAL HIGH (ref 0.44–1.00)
GFR, Estimated: 46 mL/min — ABNORMAL LOW
Glucose, Bld: 221 mg/dL — ABNORMAL HIGH (ref 70–99)
Potassium: 4.9 mmol/L (ref 3.5–5.1)
Sodium: 123 mmol/L — ABNORMAL LOW (ref 135–145)

## 2024-08-26 LAB — PRO BRAIN NATRIURETIC PEPTIDE: Pro Brain Natriuretic Peptide: >35000 pg/mL — ABNORMAL HIGH

## 2024-08-26 LAB — HEMOGLOBIN A1C
Hgb A1c MFr Bld: 8.9 % — ABNORMAL HIGH (ref 4.8–5.6)
Mean Plasma Glucose: 208.73 mg/dL

## 2024-08-26 LAB — CBC
HCT: 27.9 % — ABNORMAL LOW (ref 36.0–46.0)
Hemoglobin: 9.2 g/dL — ABNORMAL LOW (ref 12.0–15.0)
MCH: 30.3 pg (ref 26.0–34.0)
MCHC: 33 g/dL (ref 30.0–36.0)
MCV: 91.8 fL (ref 80.0–100.0)
Platelets: 194 10*3/uL (ref 150–400)
RBC: 3.04 MIL/uL — ABNORMAL LOW (ref 3.87–5.11)
RDW: 15.6 % — ABNORMAL HIGH (ref 11.5–15.5)
WBC: 16.6 10*3/uL — ABNORMAL HIGH (ref 4.0–10.5)
nRBC: 0 % (ref 0.0–0.2)

## 2024-08-26 LAB — HEPATIC FUNCTION PANEL
ALT: 18 U/L (ref 0–44)
AST: 20 U/L (ref 15–41)
Albumin: 3 g/dL — ABNORMAL LOW (ref 3.5–5.0)
Alkaline Phosphatase: 106 U/L (ref 38–126)
Bilirubin, Direct: 0.3 mg/dL — ABNORMAL HIGH (ref 0.0–0.2)
Indirect Bilirubin: 0.2 mg/dL — ABNORMAL LOW (ref 0.3–0.9)
Total Bilirubin: 0.5 mg/dL (ref 0.0–1.2)
Total Protein: 5.4 g/dL — ABNORMAL LOW (ref 6.5–8.1)

## 2024-08-26 LAB — GLUCOSE, CAPILLARY
Glucose-Capillary: 216 mg/dL — ABNORMAL HIGH (ref 70–99)
Glucose-Capillary: 157 mg/dL — ABNORMAL HIGH (ref 70–99)
Glucose-Capillary: 169 mg/dL — ABNORMAL HIGH (ref 70–99)
Glucose-Capillary: 184 mg/dL — ABNORMAL HIGH (ref 70–99)

## 2024-08-26 LAB — MAGNESIUM: Magnesium: 2.1 mg/dL (ref 1.7–2.4)

## 2024-08-26 MED ORDER — ALBUMIN HUMAN 25 % IV SOLN
25.0000 g | Freq: Four times a day (QID) | INTRAVENOUS | Status: AC
  Administered 2024-08-26 (×2): 25 g via INTRAVENOUS
  Administered 2024-08-26: 12.5 g via INTRAVENOUS
  Filled 2024-08-26 (×3): qty 100

## 2024-08-26 MED ORDER — VANCOMYCIN HCL 1500 MG/300ML IV SOLN
1500.0000 mg | Freq: Once | INTRAVENOUS | Status: AC
  Administered 2024-08-26: 1500 mg via INTRAVENOUS
  Filled 2024-08-26: qty 300

## 2024-08-26 MED ORDER — INSULIN ASPART 100 UNIT/ML IJ SOLN
0.0000 [IU] | Freq: Every day | INTRAMUSCULAR | Status: AC
  Administered 2024-08-27: 2 [IU] via SUBCUTANEOUS
  Administered 2024-08-30: 4 [IU] via SUBCUTANEOUS
  Administered 2024-08-31: 2 [IU] via SUBCUTANEOUS
  Administered 2024-09-01: 3 [IU] via SUBCUTANEOUS
  Filled 2024-08-26: qty 1
  Filled 2024-08-26: qty 4
  Filled 2024-08-26: qty 2
  Filled 2024-08-26: qty 1

## 2024-08-26 MED ORDER — INSULIN ASPART 100 UNIT/ML IJ SOLN
3.0000 [IU] | Freq: Three times a day (TID) | INTRAMUSCULAR | Status: DC
  Administered 2024-08-26 – 2024-08-31 (×7): 3 [IU] via SUBCUTANEOUS
  Filled 2024-08-26 (×9): qty 3

## 2024-08-26 MED ORDER — COLLAGENASE 250 UNIT/GM EX OINT
TOPICAL_OINTMENT | Freq: Every day | CUTANEOUS | Status: AC
  Filled 2024-08-26 (×2): qty 30

## 2024-08-26 MED ORDER — INSULIN GLARGINE 100 UNIT/ML ~~LOC~~ SOLN
10.0000 [IU] | Freq: Two times a day (BID) | SUBCUTANEOUS | Status: DC
  Administered 2024-08-26 – 2024-08-28 (×6): 10 [IU] via SUBCUTANEOUS
  Filled 2024-08-26 (×8): qty 0.1

## 2024-08-26 MED ORDER — VANCOMYCIN HCL IN DEXTROSE 1-5 GM/200ML-% IV SOLN
1000.0000 mg | INTRAVENOUS | Status: DC
  Administered 2024-08-27 – 2024-08-29 (×3): 1000 mg via INTRAVENOUS
  Filled 2024-08-26 (×3): qty 200

## 2024-08-26 MED ORDER — INSULIN ASPART 100 UNIT/ML IJ SOLN
0.0000 [IU] | Freq: Three times a day (TID) | INTRAMUSCULAR | Status: AC
  Administered 2024-08-26: 3 [IU] via SUBCUTANEOUS
  Administered 2024-08-26 – 2024-08-27 (×4): 2 [IU] via SUBCUTANEOUS
  Administered 2024-08-28: 5 [IU] via SUBCUTANEOUS
  Administered 2024-08-28: 2 [IU] via SUBCUTANEOUS
  Administered 2024-08-28: 3 [IU] via SUBCUTANEOUS
  Administered 2024-08-29: 2 [IU] via SUBCUTANEOUS
  Administered 2024-08-29: 1 [IU] via SUBCUTANEOUS
  Administered 2024-08-30: 3 [IU] via SUBCUTANEOUS
  Administered 2024-08-30: 1 [IU] via SUBCUTANEOUS
  Administered 2024-08-30 – 2024-08-31 (×2): 3 [IU] via SUBCUTANEOUS
  Administered 2024-08-31: 7 [IU] via SUBCUTANEOUS
  Administered 2024-09-01: 2 [IU] via SUBCUTANEOUS
  Administered 2024-09-01: 1 [IU] via SUBCUTANEOUS
  Administered 2024-09-01: 3 [IU] via SUBCUTANEOUS
  Filled 2024-08-26: qty 3
  Filled 2024-08-26: qty 2
  Filled 2024-08-26 (×2): qty 1
  Filled 2024-08-26: qty 2
  Filled 2024-08-26: qty 7
  Filled 2024-08-26: qty 5
  Filled 2024-08-26 (×2): qty 2
  Filled 2024-08-26: qty 3
  Filled 2024-08-26: qty 2
  Filled 2024-08-26 (×2): qty 3
  Filled 2024-08-26 (×2): qty 2
  Filled 2024-08-26: qty 3
  Filled 2024-08-26: qty 1
  Filled 2024-08-26: qty 3

## 2024-08-26 MED ORDER — QUETIAPINE FUMARATE 25 MG PO TABS: 25.0000 mg | ORAL_TABLET | Freq: Every evening | ORAL | Status: DC | PRN

## 2024-08-26 MED ORDER — SODIUM CHLORIDE 1 G PO TABS: 2.0000 g | ORAL_TABLET | Freq: Three times a day (TID) | ORAL | Status: DC

## 2024-08-26 MED ORDER — FUROSEMIDE 10 MG/ML IJ SOLN
40.0000 mg | Freq: Two times a day (BID) | INTRAMUSCULAR | Status: AC
  Administered 2024-08-26 (×2): 40 mg via INTRAVENOUS
  Filled 2024-08-26 (×2): qty 4

## 2024-08-26 NOTE — Progress Notes (Signed)
 "  Rounding Note   Patient Name: [name] Date of Encounter: 08/26/2024  [facility] Cardiologist: Jayson Sierras, MD   Subjective Somnolent but arousable  Scheduled Meds:  aspirin   81 mg Oral Daily   carvedilol   3.125 mg Oral BID WC   clopidogrel   75 mg Oral Daily   collagenase    Topical Daily   ezetimibe   10 mg Oral Daily   furosemide   40 mg Intravenous Q12H   heparin   5,000 Units Subcutaneous Q8H   insulin  aspart  0-5 Units Subcutaneous QHS   insulin  aspart  0-9 Units Subcutaneous TID WC   insulin  aspart  3 Units Subcutaneous TID WC   insulin  glargine  10 Units Subcutaneous BID   lactose free nutrition  237 mL Oral TID WC   multivitamin with minerals  1 tablet Oral Daily   nicotine   21 mg Transdermal Daily   polyethylene glycol  17 g Oral Daily   rosuvastatin   40 mg Oral QPM   senna-docusate  1 tablet Oral BID   spironolactone   12.5 mg Oral Daily   Continuous Infusions:  albumin  human 25 g (08/26/24 0847)   ampicillin -sulbactam (UNASYN ) IV 3 g (08/26/24 0617)   [START ON 08/27/2024] vancomycin      vancomycin  1,500 mg (08/26/24 1008)   PRN Meds: acetaminophen  **OR** acetaminophen , albuterol , ALPRAZolam , hydrALAZINE , HYDROmorphone  (DILAUDID ) injection, ondansetron  **OR** ondansetron  (ZOFRAN ) IV, oxyCODONE    Vital Signs  Vitals:   08/25/24 2311 08/26/24 0333 08/26/24 0744 08/26/24 0908  BP: (!) 151/67 (!) 152/65  (!) 143/70  Pulse: 88 79  88  Resp: 14 20    Temp: 98.1 F (36.7 C) 98 F (36.7 C) 98.5 F (36.9 C)   TempSrc: Oral Oral Oral   SpO2: 98% 95%    Weight:  70 kg    Height:        Intake/Output Summary (Last 24 hours) at 08/26/2024 1022 Last data filed at 08/26/2024 0942 Gross per 24 hour  Intake 410 ml  Output 400 ml  Net 10 ml      08/26/2024    3:33 AM 08/25/2024    4:31 AM 08/24/2024    4:12 AM  Last 3 Weights  Weight (lbs) 154 lb 5.2 oz 148 lb 13 oz 139 lb 1.8 oz  Weight (kg) 70 kg 67.5 kg 63.1 kg      Telemetry Not on  telemetry.  ECG  No new ECG tracing. - Personally Reviewed  Physical Exam  GEN: No acute distress.   Neck: No JVD. Cardiac: RRR. No murmurs, rubs, or gallops.  Respiratory: Clear to auscultation bilaterally. No wheezes, rhonchi, or rales. MS: No lower extremity edema S/p bilateral above knee amputations. Neuro:  somnolent but arousable Psych: unable to assess  Labs High Sensitivity Troponin:  No results for input(s): TROPONINIHS in the last 720 hours. No results for input(s): TRNPT in the last 720 hours.     Chemistry Recent Labs  Lab 08/19/24 1839 08/19/24 1848 08/20/24 9687 08/21/24 0257 08/22/24 0430 08/23/24 0529 08/25/24 0409 08/26/24 0418  NA 139  --    < > 145 146* 142 125* 123*  K 4.5  --    < > 3.7 3.0* 3.8 4.5 4.9  CL 108  --    < > 105 103 103 91* 90*  CO2 13*  --    < > 25 33* 30 25 23   GLUCOSE 259*  --    < > 194* 77 86 273* 221*  BUN  98*  --    < > 83* 62* 51* 44* 41*  CREATININE 3.96*  --    < > 2.81* 2.00* 1.72* 1.40* 1.36*  CALCIUM  8.0*  --    < > 7.5* 7.5* 7.7* 7.6* 7.6*  MG  --  2.3  --   --  2.3  --   --  2.1  PROT 5.5*  --   --  5.0*  --   --   --   --   ALBUMIN  3.1*  --   --  2.8*  --   --   --   --   AST 31  --   --  30  --   --   --   --   ALT 38  --   --  28  --   --   --   --   ALKPHOS 174*  --   --  138*  --   --   --   --   BILITOT 0.5  --   --  0.4  --   --   --   --   GFRNONAA 13*  --    < > 19* 29* 35* 44* 46*  ANIONGAP 19*  --    < > 15 11 8 10 10    < > = values in this interval not displayed.    Lipids No results for input(s): CHOL, TRIG, HDL, LABVLDL, LDLCALC, CHOLHDL in the last 168 hours.  Hematology Recent Labs  Lab 08/21/24 0257 08/25/24 0409 08/26/24 0418  WBC 8.9 14.2* 16.6*  RBC 3.61* 3.38* 3.04*  HGB 10.8* 10.2* 9.2*  HCT 33.9* 31.0* 27.9*  MCV 93.9 91.7 91.8  MCH 29.9 30.2 30.3  MCHC 31.9 32.9 33.0  RDW 15.9* 15.8* 15.6*  PLT 137* 177 194   Thyroid  No results for input(s): TSH, FREET4 in the  last 168 hours.  BNP Recent Labs  Lab 08/19/24 1839  PROBNP >35,000.0*    DDimer No results for input(s): DDIMER in the last 168 hours.   Radiology  CT ABDOMEN PELVIS WO CONTRAST Result Date: 08/25/2024 CLINICAL DATA:  Acute abdominal pain EXAM: CT ABDOMEN AND PELVIS WITHOUT CONTRAST TECHNIQUE: Multidetector CT imaging of the abdomen and pelvis was performed following the standard protocol without IV contrast. RADIATION DOSE REDUCTION: This exam was performed according to the departmental dose-optimization program which includes automated exposure control, adjustment of the mA and/or kV according to patient size and/or use of iterative reconstruction technique. COMPARISON:  CT abdomen and pelvis dated 09/01/2023 FINDINGS: Lower chest: No focal consolidation or pulmonary nodule in the lung bases. Small bilateral pleural effusions. Mild multichamber cardiomegaly. Hypodensity of the blood pool may represent anemia. Hepatobiliary: No focal hepatic lesions. No intra or extrahepatic biliary ductal dilation. Cholecystectomy. Pancreas: No focal lesions or main ductal dilation. Spleen: Normal in size without focal abnormality. Adrenals/Urinary Tract: No adrenal nodules. Atrophic right kidney. No hydronephrosis or calculi. Small volume intraluminal gas within the urinary bladder. No focal bladder wall thickening. Stomach/Bowel: Normal appearance of the stomach. No evidence of bowel wall thickening, distention, or inflammatory changes. Appendix is not discretely seen. Vascular/Lymphatic: Aortic atherosclerosis. Partially imaged right common femoral artery vascular stent. No enlarged abdominal or pelvic lymph nodes. Reproductive: No adnexal masses. Other: Small volume pelvic and presacral free fluid. No free air or fluid collection. Musculoskeletal: No acute or abnormal lytic or blastic osseous lesions. Scattered foci of subcutaneous emphysema within the anterior abdominal wall, likely injection related. Diffuse  body wall edema. IMPRESSION: 1. No acute abdominopelvic abnormality. 2. Small volume intraluminal gas within the urinary bladder. Recommend correlation with recent instrumentation. 3. Small bilateral pleural effusions, small volume pelvic and presacral free fluid, and diffuse body wall edema, suggestive of volume overload. 4. Mild multichamber cardiomegaly. 5. Hypodensity of the blood pool may represent anemia. 6.  Aortic Atherosclerosis (ICD10-I70.0). Electronically Signed   By: Limin  Xu M.D.   On: 08/25/2024 11:40    Cardiac Studies  Complete Echocardiogram 2024-08-17: Impressions: 1. Left ventricular ejection fraction, by estimation, is 35 to 40%. The  left ventricle has mildly decreased function. The left ventricle  demonstrates global hypokinesis. Left ventricular diastolic parameters are  indeterminate.   2. Right ventricular systolic function is low normal. The right  ventricular size is mildly enlarged. Tricuspid regurgitation signal is  inadequate for assessing PA pressure.   3. Left atrial size was severely dilated.   4. A small pericardial effusion is present. The pericardial effusion is  circumferential.   5. Eccentric posterior mitral regurgitation that may be underestimated  due to eccentricity of jet. At least moderate mitral regurgitation. MV  VTI/AV VTI ratio is 1.1 suggesting moderate MR. The mitral valve is  abnormal. Moderate mitral valve  regurgitation. Mild mitral stenosis. The mean mitral valve gradient is 5.0  mmHg.   6. AVA VTI 1.3, mean gradient 5 mmHg, DI 0.46, SVI 26. Data would support  low flow low gradient moderate aortic stenosis. . The aortic valve is  tricuspid. There is moderate calcification of the aortic valve. There is  moderate thickening of the aortic  valve. Aortic valve regurgitation is moderate. Moderate aortic valve  stenosis.   7. The inferior vena cava is dilated in size with <50% respiratory  variability, suggesting right atrial pressure of  15 mmHg.  _______________  Limited Echocardiogram 08/20/2024: Impressions:  1. Left ventricular ejection fraction, by estimation, is 50 to 55%. Left  ventricular ejection fraction by 2D MOD biplane is 53.7 %. The left  ventricle has low normal function. The left ventricular internal cavity  size was moderately to severely dilated.   There is severe eccentric left ventricular hypertrophy.   2. The mitral valve is normal in structure. Mild mitral valve  regurgitation.   3. The aortic valve is normal in structure. Aortic valve regurgitation is  mild.   4. The inferior vena cava is normal in size with greater than 50%  respiratory variability, suggesting right atrial pressure of 3 mmHg.   Comparison(s): Prior images reviewed side by side. Limited for EF. EF has  improved. AI and MR improved and now mild.  _______________  Carotid Dopplers 08/20/2024: Summary: Right Carotid: Velocities in the right ICA are consistent with a 1-39%  stenosis.   Left Carotid: Velocities in the left ICA are consistent with a 40-59%  stenosis.   Vertebrals: Right vertebral artery demonstrates antegrade flow. Left  vertebral not assessed.  Subclavians: Normal flow hemodynamics were seen in bilateral subclavian  arteries.  _______________  Transcranial Doppler 08/20/2024: Summary: Absent left temporal window limits evaluation of anterior circulation on  the left. Mildly elevated right middle and anterior cerbral artery mean  flow velocitie sof unclear significance. Globally elevated pulsatility  indices suggests diffus eintracranial  atherosclerosis likely.   Patient Profile   54 y.o. female with a history of chronic HFrEF with EF of 35-40%, frequent PVCs, low flow low gradient moderate aortic stenosis  moderate mitral regurgitation, PAD s/p bilateral above knee amputations, carotid artery disease, COPD,  prior PE,  hyperlipidemia, type 2 diabetes mellitus, and CKD stage III who was admitted at [facility] on 08/15/2024 with acute on chronic CHF and altered mental status. He subsequently developed stroke like symptoms and was transferred to [facility] on 08/19/2024 for further evaluation.  Assessment & Plan   Acute on Chronic HFrEF History of chronic HFrEF.  Recent patient Echo on 08/07/2024 showed LVEF of 35-40% with global hypokinesis.  proBNP at [facility] was markedly elevated at > 70,000.  Repeat proBNP here at [facility] >35,000. Repeat limited Echo this admission showed EF of 50-55% with severe LVH and possible wall motion abnormalities in RCA distribution, mild AI, and mild MR.  He was initially diuresed with IV Lasix   at [facility] but had worsening renal function with creatinine peaking at 4.2  He has not received any Lasix  since being here at [facility]. Creatinine continues to slowly improve. Weight is up 8 lbs since arrival to [facility]. - Euvolemic on exam.  - Continue Coreg  3.125mg  twice daily  - Home Entresto  and Jardiance  and spironolactone  were on hold due to AKI from initial over-diuresis. Renal function has now improved so we could like start to slowly restart these.  Restarted spironolactone  1/30. - She was restarted on IV Lasix  today due to worsening hyponatremia.  Has had significant change in sodium levels.  Would consider nephrology evaluation  Elevated Troponin High-sensitivity troponin 70 >> 75 >> 128. Echo showed improvement of LVEF to 50-55% but regional wall motion abnormalities in RCA distribution. - No chest pain.  - Troponin elevation not consistent with ACS and secondary to demand ischemia. No plans for cardiac catheterization at this time given recent AKI and recent stroke. Can consider outpatient ischemic evaluation after she recovers from stroke.  Valvular Heart Disease Recent outpatient Echo on 08/11/2024 showed low flow low gradient moderate aortic stenosis/ moderate aortic insufficiency, moderate mitral regurgitation/ mild mitral stenosis. However, repeat limited  Echo this admission showed only mild aortic insufficiency and mild mitral regurgitation. - Continue to monitor as an outpatient.   Frequent PVC History of frequent PVCs in the past. PVC ablation was attempted in 01/2024 but was aborted given very low frequency PVCs at that time even with Isoproterenol .  - Not on telemetry but no ectopy noted on exam. - Continue Coreg  3.125mg  twice daily. - She is not currently on telemetry. RN mentioned that she is awaiting bed placement at a SNF. If she is not going to be discharged today, would recommend putting her back on telemetry given her acute stroke this admission.  PAD S/p bilateral above knee amputations.  - Continue DAPT with Aspirin  81mg  daily and Plavix  75mg  daily. - Continue Crestor  40mg  daily and Zetia  10mg  daily.  Carotid Artery Disease Carotid dopplers this admission showed 40-59% stenosis of left ICA and 1-39% stenosis of right ICA. - Continue DAPT and Crestor / Zetia  as above.  Hypertension BP mildly elevated with systolic BP in the 140s-150s.  - Continue Coreg  3.125mg  twice daily.  - Home Entresto  and Spironolactone  on hold due to AKI. But creatinine has normalized so restarted spironolactone   Hyperlipidemia  Lipid panel at [facility]: Total Cholesterol 166, Triglycerides 66, HDL 36, LDL 119. LDL goal <55.  - Continue Crestor  40mg  daily. - Continue Zetia  10mg  daily (started this admission). - Will need repeat lipid panel and LFTs in 6-8 weeks.  - Would benefit from referral to lipid clinic for PCSK9 inhibitor.  AKI on CKD Stage III Creatinine was 2.03 on arrival to  [facility] and peaked at 4.2. Baseline creatinine around 1.7 to 2.3. Seen by Nephrology and was treated with Bicarb. AKI felt to be secondary to overdiuresis +/- Entresto . - Creatinine has been gradually improving and is 1.4 today.  Acute Ischemic Stroke MRI showed scattered infarcts including bilateral frontal, parietal, occipital, basal ganglia, left thalamus  and cerebellum. Suspected embolic phenomena.  - Continue DAPT with Aspirin  and Plavix .  - Continue Crestor / Zetia . - Will need outpatient 30 day event monitor at discharge.  - Neurology has signed off.  Hyponatremia Sodium 123 today. - Management per primary team, would consider nephrology evaluation  Otherwise, per primary team: - Type 2 diabetes mellitus - Anemia of chronic disease - Altered mental status - Dysphagia  - Sacral ulcer - Leukocytosis - Abdominal pain/ flank pain - Nausea   For questions or updates, please contact [facility] Please consult [email or web address] for contact info under     Signed, Lonni LITTIE Nanas, MD  08/26/2024, 10:22 AM      "

## 2024-08-26 NOTE — Progress Notes (Addendum)
 Pharmacy Antibiotic Note  [name] is a 54 y.o. female admitted on 08/19/2024 with cellulitis.  Pharmacy has been consulted for vancomycin  dosing.  Plan: Vancomycin  1500 mg IV x1 Vancomycin  1000 mg IV q24h (eAUC 455.8, Vd 0.72, Scr 1.36) Follow up renal function and adjust as needed  Height: 5' 8 (172.7 cm) (prior to bilateral AKA) Weight: 70 kg (154 lb 5.2 oz) IBW/kg (Calculated) : 63.9  Temp (24hrs), Avg:98 F (36.7 C), Min:97.4 F (36.3 C), Max:98.8 F (37.1 C)  Recent Labs  Lab 08/19/24 1839 08/19/24 2116 08/20/24 0312 08/21/24 0257 08/22/24 0430 08/23/24 0529 08/25/24 0409 08/26/24 0418  WBC 11.5*  --  10.7* 8.9  --   --  14.2* 16.6*  CREATININE 3.96*  --  3.72* 2.81* 2.00* 1.72* 1.40* 1.36*  LATICACIDVEN 1.3 1.2  --   --   --   --   --   --     Estimated Creatinine Clearance: 47.1 mL/min (A) (by C-G formula based on SCr of 1.36 mg/dL (H)).    Allergies[1]   Microbiology results: 1/24 BCx: ngtd  Thank you for allowing pharmacy to be a part of this patients care.  Elma Fail, PharmD PGY1 Clinical Pharmacist [facility]  08/26/2024 7:34 AM      [1] No Known Allergies

## 2024-08-26 NOTE — TOC Progression Note (Signed)
 Transition of Care [facility]) - Progression Note    Patient Details  Name: [name] MRN: [id number] Date of Birth: 10-21-1970  [facility] CM/SW Contact  [name], [name] Phone Number: 08/26/2024, 10:00 AM  Clinical Narrative:     Per [name][name]ia[facility] - insurance still pending]- she will notify TOC once they have received determination from insurance.  Montie Louder, MSW, LCSW Clinical Social Worker     Expected Discharge Plan: Skilled Nursing Facility Barriers to Discharge: Insurance Authorization               Expected Discharge Plan and Services   Discharge Planning Services: CM Consult Post Acute Care Choice: IP Rehab Living arrangements for the past 2 months: Single Family Home                   DME Agency: NA                   Social Drivers of Health (SDOH) Interventions SDOH Screenings   Food Insecurity: No Food Insecurity (08/22/2024)  Housing: Low Risk (08/22/2024)  Transportation Needs: No Transportation Needs (08/22/2024)  Utilities: Not At Risk (08/22/2024)  Alcohol  Screen: Low Risk (03/18/2022)  Depression (PHQ2-9): Medium Risk (03/18/2022)  Financial Resource Strain: Low Risk (08/16/2024)   Received from [facility]  Physical Activity: Inactive (08/16/2024)   Received from [facility]  Social Connections: Socially Isolated (08/16/2024)   Received from [facility]  Stress: No Stress Concern Present (08/16/2024)   Received from [facility]  Tobacco Use: High Risk (08/22/2024)  Health Literacy: Medium Risk (08/16/2024)   Received from [facility]    Readmission Risk Interventions     No data to display

## 2024-08-26 NOTE — Plan of Care (Signed)
" °  Problem: Education: Goal: Knowledge of General Education information will improve Description: Including pain rating scale, medication(s)/side effects and non-pharmacologic comfort measures Outcome: Progressing   Problem: Health Behavior/Discharge Planning: Goal: Ability to manage health-related needs will improve Outcome: Progressing   Problem: Clinical Measurements: Goal: Will remain free from infection Outcome: Progressing   Problem: Clinical Measurements: Goal: Diagnostic test results will improve Outcome: Progressing    Problem: Clinical Measurements: Goal: Cardiovascular complication will be avoided Outcome: Progressing   Problem: Activity: Goal: Risk for activity intolerance will decrease Outcome: Progressing   Problem: Coping: Goal: Level of anxiety will decrease Outcome: Progressing   Problem: Elimination: Goal: Will not experience complications related to bowel motility Outcome: Progressing   Problem: Pain Managment: Goal: General experience of comfort will improve and/or be controlled Outcome: Progressing   Problem: Safety: Goal: Ability to remain free from injury will improve Outcome: Progressing   Problem: Skin Integrity: Goal: Risk for impaired skin integrity will decrease Outcome: Progressing   Problem: Education: Goal: Knowledge of disease or condition will improve Outcome: Progressing   Problem: Ischemic Stroke/TIA Tissue Perfusion: Goal: Complications of ischemic stroke/TIA will be minimized Outcome: Progressing   "

## 2024-08-26 NOTE — Consult Note (Addendum)
" °  CLINICAL SUPPORT TEAM - WOUND OSTOMY AND CONTINENCE TEAM  CONSULTATION SERVICES   WOC Nurse-Inpatient Note  WOC Nurse Consult Note: patient admitted with an extensive deep tissue pressure injury sacrum (see consult note 1/25)  Reason for Consult: worsening sacral wound  Wound type: DTPI that has now progressed to unstageable with tan necrotic tissue  Pressure Injury POA: Yes as a deep tissue  Measurement: see nursing flowsheet  Wound bed: 100% tan necrotic  Drainage (amount, consistency, odor) foul smelling per bedside nurse   Periwound: erythema, some purple maroon discoloration still extends onto buttocks  Dressing procedure/placement/frequency:  Cleanse sacral wound with Vashe, do not rinse. Apply 1/4 thick layer of Santyl  to wound bed daily, top with saline moist gauze, dry gauze and ABD pad and clothe tape or silicone foam whichever works best.  Continue to apply Xeroform to the purple discoloration to buttocks.   Patient should be placed on a low air loss mattress for pressure redistribution (LALM was ordered on admission, per bedside nurse patient is currently not on one).  POC discussed with bedside nurse, appreciate C. Ralph and P. Anyikwa, assistance with this consult.   Thank you,    Stockton Nunley MSN, RN-BC, CWOCN "

## 2024-08-26 NOTE — Progress Notes (Signed)
 [facility] PROGRESS NOTE    Progress Note  [name]  FMW:[id number] DOB: 09/25/1970 DOA: 08/19/2024 PCP: Jeanette Comer BRAVO, PA-C     Brief Narrative:   [name] is an 54 y.o. female past medical history of HFrEF, essential hypertension, bilateral AKA, chronic kidney disease stage IIIb transferred from [facility] due to progressive renal failure, initially admitted on 08/15/2024 for acute decompensated heart failure, altered mental status and left-sided weakness for concern for stroke she did develop progressive renal failure and elevated troponins.  On presentation she was confused code stroke was called CT of the head showed focal hypodensity in the right caudate concerning for stroke.  MRI of the brain showed an acute ischemic stroke neurology and cardiology were consulted.  Work has been completed PT evaluated the patient, anticipating she will go to skilled nursing facility.  Assessment/Plan:   Acute CVA: MRI of the brain showed bilateral infarct in the frontal parietal lobe, basal ganglia and left thalamus and cerebellum suspect embolic. Neurology was consulted currently on aspirin , statin and Plavix . TEE was deferred. Due to global weakness PT evaluated the patient she will need to go to skilled nursing facility.  Altered mental status/dysphagia: Likely due to from from stroke. Started on Keppra and [address] which has been discontinued. Mentation is improved now on a dysphagia diet.  Hypervolemic hyponatremia: She was given IV fluids her sodium progressively got worse. She is currently Aldactone .  Her potassium is rising to 4.9. Specific gravity on UA was 1010, before that it was 1016, her blood glucose in the UA was greater than 500, and her albumin  is 2.8 back in 2025. She appears to be fluid overloaded with positive hepato-jugular reflux, BNP greater than 35k on the 24th CT renal pelvis shows bilateral pleural effusion small volume ascites and  diffuse body edema. Go ahead and started on albumin  for 24 hours and IV Lasix  for 24 hours monitoring I's and O's and daily weights. Will correct blood glucose, placed on fluid restriction and recheck a basic metabolic panel in the morning.  Leukocytosis likely due to infected stage IV sacral ureteral ulcer Check hepatic function panel. CT scan of the abdomen and pelvis showed no acute intra-abdominal or pelvic abnormality. Question if her leukocytosis is due to aspiration and she has dysphagia with a recent stroke. Add IV vancomycin  continue IV Unasyn . Consult wound care for chemical debridement.  Her wound has purulent drainage and foul-smelling  Acute HFrEF (heart failure with reduced ejection fraction) (HCC) Last 2D echo in January 24 showed EF of 55% with diastolic dysfunction.  Severely dilated left ventricular cavity with hypertrophy mitral regurg. Continue Coreg  and IV Lasix .  Acute kidney injury on chronic kidney stage IIIb/high anion gap metabolic acidosis: Her creatinine peaked at 4, back in 2023 was 1.5, she was given IV fluid her creatinine improved to 1.3 now she is hypoalbuminemic and third spacing. Was started on gentle IV Lasix  and IV albumin  and will see how she responds.  Essential hypertension: Continue Coreg .  History of coronary artery disease/elevated troponins: Likely due to chronic renal disease continue aspirin , Plavix  and statin. Cardiology is planning for ischemic evaluation when she clinically improves.  History of peripheral vascular disease/carotid artery disease/Status post below-knee amputation of both lower extremities (HCC): Status post bilateral BKA, her stump look clean.  Uncontrolled diabetes mellitus type 2 with a last A1c of 10. Blood glucose elevated, increase your long-acting insulin  to 10 mg twice a day. Change sliding scale to sensitive  add meal coverage.  Anemia of chronic renal disease: Hemoglobin relatively stable.  Hyperlipidemia:   continue statins.  Goals of care: She is currently a stroke Perative care was consulted addressing goals of care.    DVT prophylaxis: lovenox Family Communication:none Status is: Inpatient Remains inpatient appropriate because: The patient will need skilled nursing facility placement.    Code Status:     Code Status Orders  (From admission, onward)           Start     Ordered   08/19/24 1824  Do not attempt resuscitation (DNR)- Limited -Do Not Intubate (DNI)  (Code Status)  Continuous       Question Answer Comment  If pulseless and not breathing No CPR or chest compressions.   In Pre-Arrest Conditions (Patient Is Breathing and Has A Pulse) Do not intubate. Provide all appropriate non-invasive medical interventions. Avoid ICU transfer unless indicated or required.   Consent: Discussion documented in EHR or advanced directives reviewed      08/19/24 1824           Code Status History     Date Active Date Inactive Code Status Order ID Comments User Context   08/19/2024 1750 08/19/2024 1824 Full Code [id number]  Vernon Velna SAUNDERS, MD Inpatient   02/04/2024 1352 02/04/2024 2147 Full Code [id number]  Nancey Eulas BRAVO, MD Inpatient   08/21/2023 2227 09/03/2023 1830 Full Code [id number]  Marca Maude POUR, MD Inpatient   09/24/2021 1611 10/03/2021 0156 Full Code [id number]  Charlyne Reed, PA-C Inpatient   09/05/2021 1015 09/05/2021 1906 Full Code [id number]  Magda Debby SAILOR, MD Inpatient   02/14/2014 1507 02/15/2014 0334 Full Code [id number]  Johann Sieving, MD HOV   01/28/2012 0622 02/06/2012 1554 Full Code [id number]  Alaine Vicenta NOVAK, MD Inpatient         IV Access:   Peripheral IV   Procedures and diagnostic studies:   CT ABDOMEN PELVIS WO CONTRAST Result Date: 08/25/2024 CLINICAL DATA:  Acute abdominal pain EXAM: CT ABDOMEN AND PELVIS WITHOUT CONTRAST TECHNIQUE: Multidetector CT imaging of the abdomen and pelvis was performed following the standard protocol without IV  contrast. RADIATION DOSE REDUCTION: This exam was performed according to the departmental dose-optimization program which includes automated exposure control, adjustment of the mA and/or kV according to patient size and/or use of iterative reconstruction technique. COMPARISON:  CT abdomen and pelvis dated 09/01/2023 FINDINGS: Lower chest: No focal consolidation or pulmonary nodule in the lung bases. Small bilateral pleural effusions. Mild multichamber cardiomegaly. Hypodensity of the blood pool may represent anemia. Hepatobiliary: No focal hepatic lesions. No intra or extrahepatic biliary ductal dilation. Cholecystectomy. Pancreas: No focal lesions or main ductal dilation. Spleen: Normal in size without focal abnormality. Adrenals/Urinary Tract: No adrenal nodules. Atrophic right kidney. No hydronephrosis or calculi. Small volume intraluminal gas within the urinary bladder. No focal bladder wall thickening. Stomach/Bowel: Normal appearance of the stomach. No evidence of bowel wall thickening, distention, or inflammatory changes. Appendix is not discretely seen. Vascular/Lymphatic: Aortic atherosclerosis. Partially imaged right common femoral artery vascular stent. No enlarged abdominal or pelvic lymph nodes. Reproductive: No adnexal masses. Other: Small volume pelvic and presacral free fluid. No free air or fluid collection. Musculoskeletal: No acute or abnormal lytic or blastic osseous lesions. Scattered foci of subcutaneous emphysema within the anterior abdominal wall, likely injection related. Diffuse body wall edema. IMPRESSION: 1. No acute abdominopelvic abnormality. 2. Small volume intraluminal gas within the urinary bladder. Recommend correlation with recent instrumentation. 3. Small bilateral pleural  effusions, small volume pelvic and presacral free fluid, and diffuse body wall edema, suggestive of volume overload. 4. Mild multichamber cardiomegaly. 5. Hypodensity of the blood pool may represent anemia. 6.   Aortic Atherosclerosis (ICD10-I70.0). Electronically Signed   By: Limin  Xu M.D.   On: 08/25/2024 11:40     Medical Consultants:   None.   Subjective:    [name] complaining of abdominal pain  Objective:    Vitals:   08/25/24 1614 08/25/24 1958 08/25/24 2311 08/26/24 0333  BP: (!) 155/63 (!) 152/69 (!) 151/67 (!) 152/65  Pulse: 84 92 88 79  Resp: 14 17 14 20   Temp: 98 F (36.7 C) 97.8 F (36.6 C) 98.1 F (36.7 C) 98 F (36.7 C)  TempSrc: Oral Oral Oral Oral  SpO2: 96% 99% 98% 95%  Weight:    70 kg  Height:       SpO2: 95 %   Intake/Output Summary (Last 24 hours) at 08/26/2024 0705 Last data filed at 08/25/2024 1300 Gross per 24 hour  Intake 360 ml  Output 400 ml  Net -40 ml   Filed Weights   08/24/24 0412 08/25/24 0431 08/26/24 0333  Weight: 63.1 kg 67.5 kg 70 kg    Exam: General exam: In no acute distress. Respiratory system: Good air movement and clear to auscultation. Cardiovascular system: S1 & S2 heard, RRR.  Positive hepatojugular reflex Gastrointestinal system: Abdomen is nondistended, soft and nontender.  Extremities: 2+ edema Skin: No rashes, lesions or ulcers Psychiatry: Judgement and insight appear normal. Mood & affect appropriate.    Data Reviewed:    Labs: Basic Metabolic Panel: Recent Labs  Lab 08/19/24 1848 08/20/24 9687 08/21/24 0257 08/22/24 0430 08/23/24 0529 08/25/24 0409 08/26/24 0418  NA  --    < > 145 146* 142 125* 123*  K  --    < > 3.7 3.0* 3.8 4.5 4.9  CL  --    < > 105 103 103 91* 90*  CO2  --    < > 25 33* 30 25 23   GLUCOSE  --    < > 194* 77 86 273* 221*  BUN  --    < > 83* 62* 51* 44* 41*  CREATININE  --    < > 2.81* 2.00* 1.72* 1.40* 1.36*  CALCIUM   --    < > 7.5* 7.5* 7.7* 7.6* 7.6*  MG 2.3  --   --  2.3  --   --  2.1  PHOS 6.1*  --   --   --   --   --   --    < > = values in this interval not displayed.   GFR Estimated Creatinine Clearance: 47.1 mL/min (A) (by C-G formula based on SCr of 1.36  mg/dL (H)). Liver Function Tests: Recent Labs  Lab 08/19/24 1839 08/21/24 0257  AST 31 30  ALT 38 28  ALKPHOS 174* 138*  BILITOT 0.5 0.4  PROT 5.5* 5.0*  ALBUMIN  3.1* 2.8*   No results for input(s): LIPASE, AMYLASE in the last 168 hours. No results for input(s): AMMONIA in the last 168 hours. Coagulation profile No results for input(s): INR, PROTIME in the last 168 hours. COVID-19 Labs  No results for input(s): DDIMER, FERRITIN, LDH, CRP in the last 72 hours.  Lab Results  Component Value Date   SARSCOV2NAA RESULT: NEGATIVE 09/22/2021    CBC: Recent Labs  Lab 08/19/24 1839 08/20/24 0312 08/21/24 0257 08/25/24 0409 08/26/24 0418  WBC 11.5*  10.7* 8.9 14.2* 16.6*  NEUTROABS 8.8*  --   --   --   --   HGB 11.1* 11.2* 10.8* 10.2* 9.2*  HCT 34.6* 34.7* 33.9* 31.0* 27.9*  MCV 95.3 93.5 93.9 91.7 91.8  PLT 154 138* 137* 177 194   Cardiac Enzymes: No results for input(s): CKTOTAL, CKMB, CKMBINDEX, TROPONINI in the last 168 hours. BNP (last 3 results) Recent Labs    08/19/24 1839  PROBNP >35,000.0*   CBG: Recent Labs  Lab 08/25/24 0429 08/25/24 0759 08/25/24 1142 08/25/24 1734 08/25/24 2136  GLUCAP 248* 238* 110* 233* 312*   D-Dimer: No results for input(s): DDIMER in the last 72 hours. Hgb A1c: No results for input(s): HGBA1C in the last 72 hours. Lipid Profile: No results for input(s): CHOL, HDL, LDLCALC, TRIG, CHOLHDL, LDLDIRECT in the last 72 hours. Thyroid  function studies: No results for input(s): TSH, T4TOTAL, T3FREE, THYROIDAB in the last 72 hours.  Invalid input(s): FREET3 Anemia work up: No results for input(s): VITAMINB12, FOLATE, FERRITIN, TIBC, IRON, RETICCTPCT in the last 72 hours. Sepsis Labs: Recent Labs  Lab 08/19/24 1839 08/19/24 2116 08/20/24 0312 08/21/24 0257 08/25/24 0409 08/26/24 0418  WBC 11.5*  --  10.7* 8.9 14.2* 16.6*  LATICACIDVEN 1.3 1.2  --   --   --   --     Microbiology Recent Results (from the past 240 hours)  Culture, blood (Routine X 2) w Reflex to ID Panel     Status: None   Collection Time: 08/19/24  6:39 PM   Specimen: BLOOD LEFT ARM  Result Value Ref Range Status   Specimen Description BLOOD LEFT ARM  Final   Special Requests   Final    BOTTLES DRAWN AEROBIC AND ANAEROBIC Blood Culture results may not be optimal due to an inadequate volume of blood received in culture bottles   Culture   Final    NO GROWTH 5 DAYS Performed at [facility], [address], [place], [name] [address]    Report Status 08/24/2024 FINAL  Final  Culture, blood (Routine X 2) w Reflex to ID Panel     Status: None   Collection Time: 08/19/24  6:42 PM   Specimen: BLOOD RIGHT HAND  Result Value Ref Range Status   Specimen Description BLOOD RIGHT HAND  Final   Special Requests   Final    BOTTLES DRAWN AEROBIC AND ANAEROBIC Blood Culture adequate volume   Culture   Final    NO GROWTH 5 DAYS Performed at [facility], [address] [place], [name] [address]    Report Status 08/24/2024 FINAL  Final     Medications:    aspirin   81 mg Oral Daily   carvedilol   3.125 mg Oral BID WC   clopidogrel   75 mg Oral Daily   collagenase    Topical Daily   ezetimibe   10 mg Oral Daily   heparin   5,000 Units Subcutaneous Q8H   insulin  aspart  0-15 Units Subcutaneous TID WC   insulin  aspart  0-5 Units Subcutaneous QHS   insulin  glargine  12 Units Subcutaneous Daily   lactose free nutrition  237 mL Oral TID WC   multivitamin with minerals  1 tablet Oral Daily   nicotine   21 mg Transdermal Daily   polyethylene glycol  17 g Oral Daily   rosuvastatin   40 mg Oral QPM   senna-docusate  1 tablet Oral BID   spironolactone   12.5 mg Oral Daily   Continuous Infusions:  ampicillin -sulbactam (UNASYN ) IV 3 g (08/26/24 0617)      LOS: 7 days   Erle Odell Castor  [facility]  08/26/2024, 7:05 AM

## 2024-08-27 DIAGNOSIS — Z89512 Acquired absence of left leg below knee: Secondary | ICD-10-CM | POA: Diagnosis not present

## 2024-08-27 DIAGNOSIS — J9601 Acute respiratory failure with hypoxia: Secondary | ICD-10-CM | POA: Diagnosis not present

## 2024-08-27 DIAGNOSIS — Z89511 Acquired absence of right leg below knee: Secondary | ICD-10-CM | POA: Diagnosis not present

## 2024-08-27 DIAGNOSIS — I5021 Acute systolic (congestive) heart failure: Secondary | ICD-10-CM | POA: Diagnosis not present

## 2024-08-27 LAB — BASIC METABOLIC PANEL WITH GFR
Anion gap: 14 (ref 5–15)
BUN: 42 mg/dL — ABNORMAL HIGH (ref 6–20)
CO2: 20 mmol/L — ABNORMAL LOW (ref 22–32)
Calcium: 8.1 mg/dL — ABNORMAL LOW (ref 8.9–10.3)
Chloride: 91 mmol/L — ABNORMAL LOW (ref 98–111)
Creatinine, Ser: 1.6 mg/dL — ABNORMAL HIGH (ref 0.44–1.00)
GFR, Estimated: 38 mL/min — ABNORMAL LOW
Glucose, Bld: 157 mg/dL — ABNORMAL HIGH (ref 70–99)
Potassium: 5 mmol/L (ref 3.5–5.1)
Sodium: 125 mmol/L — ABNORMAL LOW (ref 135–145)

## 2024-08-27 LAB — SODIUM, URINE, RANDOM: Sodium, Ur: <30 mmol/L

## 2024-08-27 LAB — GLUCOSE, CAPILLARY
Glucose-Capillary: 116 mg/dL — ABNORMAL HIGH (ref 70–99)
Glucose-Capillary: 180 mg/dL — ABNORMAL HIGH (ref 70–99)
Glucose-Capillary: 181 mg/dL — ABNORMAL HIGH (ref 70–99)
Glucose-Capillary: 228 mg/dL — ABNORMAL HIGH (ref 70–99)

## 2024-08-27 MED ORDER — SODIUM ZIRCONIUM CYCLOSILICATE 10 G PO PACK: 10.0000 g | PACK | Freq: Three times a day (TID) | ORAL | Status: DC

## 2024-08-27 MED ORDER — ALBUMIN HUMAN 25 % IV SOLN
25.0000 g | Freq: Four times a day (QID) | INTRAVENOUS | Status: AC
  Administered 2024-08-27 – 2024-08-28 (×4): 25 g via INTRAVENOUS
  Filled 2024-08-27 (×4): qty 100

## 2024-08-27 MED ORDER — SODIUM ZIRCONIUM CYCLOSILICATE 10 G PO PACK
10.0000 g | PACK | Freq: Once | ORAL | Status: AC
  Administered 2024-08-27: 10 g via ORAL
  Filled 2024-08-27: qty 1

## 2024-08-27 MED ORDER — FUROSEMIDE 10 MG/ML IJ SOLN
80.0000 mg | Freq: Once | INTRAMUSCULAR | Status: AC
  Administered 2024-08-27: 80 mg via INTRAVENOUS
  Filled 2024-08-27: qty 8

## 2024-08-27 MED ORDER — SODIUM CHLORIDE 0.9 % IV SOLN
2.0000 g | Freq: Two times a day (BID) | INTRAVENOUS | Status: DC
  Administered 2024-08-27 – 2024-08-30 (×7): 2 g via INTRAVENOUS
  Filled 2024-08-27 (×7): qty 12.5

## 2024-08-27 NOTE — Plan of Care (Signed)

## 2024-08-27 NOTE — Progress Notes (Signed)
 "  Rounding Note   Patient Name: [name] Date of Encounter: 08/27/2024  [facility] Cardiologist: Jayson Sierras, MD   Subjective Somnolent but arousable, not answering questions  Scheduled Meds:  aspirin   81 mg Oral Daily   carvedilol   3.125 mg Oral BID WC   clopidogrel   75 mg Oral Daily   collagenase    Topical Daily   ezetimibe   10 mg Oral Daily   heparin   5,000 Units Subcutaneous Q8H   insulin  aspart  0-5 Units Subcutaneous QHS   insulin  aspart  0-9 Units Subcutaneous TID WC   insulin  aspart  3 Units Subcutaneous TID WC   insulin  glargine  10 Units Subcutaneous BID   lactose free nutrition  237 mL Oral TID WC   multivitamin with minerals  1 tablet Oral Daily   nicotine   21 mg Transdermal Daily   polyethylene glycol  17 g Oral Daily   rosuvastatin   40 mg Oral QPM   senna-docusate  1 tablet Oral BID   Continuous Infusions:  albumin  human 25 g (08/27/24 1001)   ceFEPime  (MAXIPIME ) IV 2 g (08/27/24 1142)   vancomycin  1,000 mg (08/27/24 0837)   PRN Meds: acetaminophen  **OR** acetaminophen , albuterol , ALPRAZolam , hydrALAZINE , HYDROmorphone  (DILAUDID ) injection, ondansetron  **OR** ondansetron  (ZOFRAN ) IV, oxyCODONE , QUEtiapine    Vital Signs  Vitals:   08/26/24 1915 08/26/24 2348 08/27/24 0807 08/27/24 1126  BP: (!) 147/75 (!) 141/71  131/71  Pulse: 88 81    Resp: 20 16 18 18   Temp: 98.4 F (36.9 C) 98.4 F (36.9 C) 97.7 F (36.5 C) 97.8 F (36.6 C)  TempSrc: Oral Oral Oral Axillary  SpO2: 97% 98%    Weight:      Height:        Intake/Output Summary (Last 24 hours) at 08/27/2024 1146 Last data filed at 08/27/2024 0910 Gross per 24 hour  Intake 790 ml  Output 550 ml  Net 240 ml      08/26/2024    3:33 AM 08/25/2024    4:31 AM 08/24/2024    4:12 AM  Last 3 Weights  Weight (lbs) 154 lb 5.2 oz 148 lb 13 oz 139 lb 1.8 oz  Weight (kg) 70 kg 67.5 kg 63.1 kg      Telemetry Not on telemetry.  ECG  No new ECG tracing. - Personally  Reviewed  Physical Exam  GEN: No acute distress.   Neck: No JVD. Cardiac: RRR. No murmurs, rubs, or gallops.  Respiratory: Clear to auscultation bilaterally. No wheezes, rhonchi, or rales. MS: No lower extremity edema S/p bilateral above knee amputations. Neuro:  somnolent but arousable Psych: unable to assess  Labs High Sensitivity Troponin:  No results for input(s): TROPONINIHS in the last 720 hours. No results for input(s): TRNPT in the last 720 hours.     Chemistry Recent Labs  Lab 08/21/24 0257 08/22/24 0430 08/23/24 0529 08/25/24 0409 08/26/24 0418 08/26/24 0941 08/27/24 0237  NA 145 146*   < > 125* 123*  --  125*  K 3.7 3.0*   < > 4.5 4.9  --  5.0  CL 105 103   < > 91* 90*  --  91*  CO2 25 33*   < > 25 23  --  20*  GLUCOSE 194* 77   < > 273* 221*  --  157*  BUN 83* 62*   < > 44* 41*  --  42*  CREATININE 2.81* 2.00*   < > 1.40* 1.36*  --  1.60*  CALCIUM  7.5* 7.5*   < > 7.6* 7.6*  --  8.1*  MG  --  2.3  --   --  2.1  --   --   PROT 5.0*  --   --   --   --  5.4*  --   ALBUMIN  2.8*  --   --   --   --  3.0*  --   AST 30  --   --   --   --  20  --   ALT 28  --   --   --   --  18  --   ALKPHOS 138*  --   --   --   --  106  --   BILITOT 0.4  --   --   --   --  0.5  --   GFRNONAA 19* 29*   < > 44* 46*  --  38*  ANIONGAP 15 11   < > 10 10  --  14   < > = values in this interval not displayed.    Lipids No results for input(s): CHOL, TRIG, HDL, LABVLDL, LDLCALC, CHOLHDL in the last 168 hours.  Hematology Recent Labs  Lab 08/21/24 0257 08/25/24 0409 08/26/24 0418  WBC 8.9 14.2* 16.6*  RBC 3.61* 3.38* 3.04*  HGB 10.8* 10.2* 9.2*  HCT 33.9* 31.0* 27.9*  MCV 93.9 91.7 91.8  MCH 29.9 30.2 30.3  MCHC 31.9 32.9 33.0  RDW 15.9* 15.8* 15.6*  PLT 137* 177 194   Thyroid  No results for input(s): TSH, FREET4 in the last 168 hours.  BNP Recent Labs  Lab 08/26/24 0941  PROBNP >35,000.0*    DDimer No results for input(s): DDIMER in the last 168  hours.   Radiology  No results found.   Cardiac Studies  Complete Echocardiogram 09/01/2024: Impressions: 1. Left ventricular ejection fraction, by estimation, is 35 to 40%. The  left ventricle has mildly decreased function. The left ventricle  demonstrates global hypokinesis. Left ventricular diastolic parameters are  indeterminate.   2. Right ventricular systolic function is low normal. The right  ventricular size is mildly enlarged. Tricuspid regurgitation signal is  inadequate for assessing PA pressure.   3. Left atrial size was severely dilated.   4. A small pericardial effusion is present. The pericardial effusion is  circumferential.   5. Eccentric posterior mitral regurgitation that may be underestimated  due to eccentricity of jet. At least moderate mitral regurgitation. MV  VTI/AV VTI ratio is 1.1 suggesting moderate MR. The mitral valve is  abnormal. Moderate mitral valve  regurgitation. Mild mitral stenosis. The mean mitral valve gradient is 5.0  mmHg.   6. AVA VTI 1.3, mean gradient 5 mmHg, DI 0.46, SVI 26. Data would support  low flow low gradient moderate aortic stenosis. . The aortic valve is  tricuspid. There is moderate calcification of the aortic valve. There is  moderate thickening of the aortic  valve. Aortic valve regurgitation is moderate. Moderate aortic valve  stenosis.   7. The inferior vena cava is dilated in size with <50% respiratory  variability, suggesting right atrial pressure of 15 mmHg.  _______________  Limited Echocardiogram 08/20/2024: Impressions:  1. Left ventricular ejection fraction, by estimation, is 50 to 55%. Left  ventricular ejection fraction by 2D MOD biplane is 53.7 %. The left  ventricle has low normal function. The left ventricular internal cavity  size was moderately to severely dilated.   There is severe eccentric left ventricular hypertrophy.  2. The mitral valve is normal in structure. Mild mitral valve  regurgitation.    3. The aortic valve is normal in structure. Aortic valve regurgitation is  mild.   4. The inferior vena cava is normal in size with greater than 50%  respiratory variability, suggesting right atrial pressure of 3 mmHg.   Comparison(s): Prior images reviewed side by side. Limited for EF. EF has  improved. AI and MR improved and now mild.  _______________  Carotid Dopplers 08/20/2024: Summary: Right Carotid: Velocities in the right ICA are consistent with a 1-39%  stenosis.   Left Carotid: Velocities in the left ICA are consistent with a 40-59%  stenosis.   Vertebrals: Right vertebral artery demonstrates antegrade flow. Left  vertebral not assessed.  Subclavians: Normal flow hemodynamics were seen in bilateral subclavian  arteries.  _______________  Transcranial Doppler 08/20/2024: Summary: Absent left temporal window limits evaluation of anterior circulation on  the left. Mildly elevated right middle and anterior cerbral artery mean  flow velocitie sof unclear significance. Globally elevated pulsatility  indices suggests diffus eintracranial  atherosclerosis likely.   Patient Profile   54 y.o. female with a history of chronic HFrEF with EF of 35-40%, frequent PVCs, low flow low gradient moderate aortic stenosis  moderate mitral regurgitation, PAD s/p bilateral above knee amputations, carotid artery disease, COPD, prior PE,  hyperlipidemia, type 2 diabetes mellitus, and CKD stage III who was admitted at [facility] on 08/15/2024 with acute on chronic CHF and altered mental status. He subsequently developed stroke like symptoms and was transferred to [facility] on 08/19/2024 for further evaluation.  Assessment & Plan   Acute on Chronic HFrEF History of chronic HFrEF.  Recent patient Echo on 08/07/2024 showed LVEF of 35-40% with global hypokinesis.  proBNP at [facility] was markedly elevated at > 70,000.  Repeat proBNP here at [facility] >35,000. Repeat limited Echo this admission  showed EF of 50-55% with severe LVH and possible wall motion abnormalities in RCA distribution, mild AI, and mild MR.  He was initially diuresed with IV Lasix   at [facility] but had worsening renal function with creatinine peaking at 4.2  He has not received any Lasix  since being here at [facility]. Creatinine continues to slowly improve. Weight is up 8 lbs since arrival to [facility]. - Continue Coreg  3.125mg  twice daily  - Home Entresto  and Jardiance  and spironolactone  were on hold due to AKI from initial over-diuresis. Renal function improving so we could like start to slowly restart these.   - She was restarted on IV Lasix  yesterday due to worsening hyponatremia.  Has had significant change in sodium levels.  Would consider nephrology evaluation  Elevated Troponin High-sensitivity troponin 70 >> 75 >> 128. Echo showed improvement of LVEF to 50-55% but regional wall motion abnormalities in RCA distribution. - No chest pain.  - Troponin elevation not consistent with ACS and secondary to demand ischemia. No plans for cardiac catheterization at this time given recent AKI and recent stroke. Can consider outpatient ischemic evaluation after she recovers from stroke.  Valvular Heart Disease Recent outpatient Echo on 08/11/2024 showed low flow low gradient moderate aortic stenosis/ moderate aortic insufficiency, moderate mitral regurgitation/ mild mitral stenosis. However, repeat limited Echo this admission showed only mild aortic insufficiency and mild mitral regurgitation. - Continue to monitor as an outpatient.   Frequent PVC History of frequent PVCs in the past. PVC ablation was attempted in 01/2024 but was aborted given very low frequency PVCs at that time even with  Isoproterenol .  - Continue Coreg  3.125mg  twice daily.  PAD S/p bilateral above knee amputations.  - Continue DAPT with Aspirin  81mg  daily and Plavix  75mg  daily. - Continue Crestor  40mg  daily and Zetia  10mg  daily.  Carotid Artery  Disease Carotid dopplers this admission showed 40-59% stenosis of left ICA and 1-39% stenosis of right ICA. - Continue DAPT and Crestor / Zetia  as above.  Hypertension BP mildly elevated with systolic BP in the 140s-150s.  - Continue Coreg  3.125mg  twice daily.  - Home Entresto  and Spironolactone  on hold due to AKI.  Hyperlipidemia  Lipid panel at [facility]: Total Cholesterol 166, Triglycerides 66, HDL 36, LDL 119. LDL goal <55.  - Continue Crestor  40mg  daily. - Continue Zetia  10mg  daily (started this admission). - Will need repeat lipid panel and LFTs in 6-8 weeks.  - Would benefit from referral to lipid clinic for PCSK9 inhibitor.  AKI on CKD Stage III Creatinine was 2.03 on arrival to [facility] and peaked at 4.2. Baseline creatinine around 1.7 to 2.3. Seen by Nephrology and was treated with Bicarb. AKI felt to be secondary to overdiuresis +/- Entresto . - Creatinine has been gradually improving, worsened from 1.4 to 1.6 today  Acute Ischemic Stroke MRI showed scattered infarcts including bilateral frontal, parietal, occipital, basal ganglia, left thalamus and cerebellum. Suspected embolic phenomena.  - Continue DAPT with Aspirin  and Plavix .  - Continue Crestor / Zetia . - Will need outpatient 30 day event monitor at discharge.  - Neurology has signed off.  Hyponatremia Sodium 125 today. - Management per primary team, would consider nephrology evaluation  Otherwise, per primary team: - Type 2 diabetes mellitus - Anemia of chronic disease - Altered mental status - Dysphagia  - Sacral ulcer - Leukocytosis - Abdominal pain/ flank pain - Nausea   For questions or updates, please contact [facility] Please consult [email or web address] for contact info under     Signed, Lonni LITTIE Nanas, MD  08/27/2024, 11:46 AM      "

## 2024-08-27 NOTE — Consult Note (Signed)
 WOC team reconsulted for sacral wound. Please see consult note 1/31, chemical debridement with Santyl  started.    WOC team will follow to assess for further needs.    Thank you,    Powell Bar MSN, RN-BC, [employer]

## 2024-08-27 NOTE — Progress Notes (Signed)
" ° °  Palliative Medicine Inpatient Follow Up Note  HPI:  [name] is a 54 year old female with a past medical history significant for heart failure, peripheral arterial disease requiring bilateral above-the-knee amputations, tobacco abuse, anxiety, arthritis, depression, GERD, hypertension, and type 2 diabetes mellitus. [name] was admitted to [facility] from [facility] on January 24 in the setting of progressive renal failure. Palliative care has been asked to support additional goals of care conversations.   Today's Discussion 08/27/2024  I reviewed the chart notes including nursing notes from Dr. Jaclynn - Cardiology, Dr. Celinda, RN - [place], RN - [name]). I also reviewed vital signs which are stable morning nursing flowsheets  needs 1:1 assistance with meals, medication administrations record, labs from this morning inclusive of BMP- improved Cr of 1.60 ; I reviewed the CT abdomen and pelvis and CXR from 1/30 indicating volume overload.  I met with [name] at bedside this morning, she is alseep though does arouse. Patients wife is aware of self and place. I noted the inability to fully open her L eye and her not being able to grip my L hand which she was able to do prior although it was weak. Alerted Dr. Celinda and patients RN, [name] to these findings.    Per MSW note review we are awaiting [facility] authorization for [name]. Did not meet criteria for CIR.  Questions and concerns addressed/Palliative Support Provided.   Objective Assessment: Vital Signs Vitals:   08/26/24 2348 08/27/24 0807  BP: (!) 141/71   Pulse: 81   Resp: 16 18  Temp: 98.4 F (36.9 C) 97.7 F (36.5 C)  SpO2: 98%     Intake/Output Summary (Last 24 hours) at 08/27/2024 1059 Last data filed at 08/27/2024 0910 Gross per 24 hour  Intake 790 ml  Output 550 ml  Net 240 ml   Last Weight  Most recent update: 08/26/2024  3:59 AM    Weight  70 kg (154 lb 5.2 oz)             Gen: Middle-age Caucasian female chronically ill in appearance HEENT: moist mucous membranes CV: Regular rate and rhythm. PULM: On room air breathing is even and nonlabored ABD: soft/nontender  EXT: Bilateral AKA's Neuro: Aware of self and place, expressive aphasia  SUMMARY OF RECOMMENDATIONS   DNAR/DNI  Continue current care allowing time for outcomes  Have alerted RN and hospitalist to patients L sided weakness --> Appears more pronounced this morning than on prior days   Plan for transition to SNF once accepted  Ongoing palliative medicine team support as needed ______________________________________________________________________________________ [name] [facility] Palliative Medicine Team Team Cell Phone: [phone] Please utilize secure chat with additional questions, if there is no response within 30 minutes please call the above phone number  I personally spent a total of 28 minutes in the care of the patient today including preparing to see the patient, performing a medically appropriate exam/evaluation, referring and communicating with other health care professionals, documenting clinical information in the EHR, and coordinating care.      "

## 2024-08-28 DIAGNOSIS — Z89512 Acquired absence of left leg below knee: Secondary | ICD-10-CM | POA: Diagnosis not present

## 2024-08-28 DIAGNOSIS — Z89511 Acquired absence of right leg below knee: Secondary | ICD-10-CM | POA: Diagnosis not present

## 2024-08-28 DIAGNOSIS — J9601 Acute respiratory failure with hypoxia: Secondary | ICD-10-CM | POA: Diagnosis not present

## 2024-08-28 DIAGNOSIS — I5021 Acute systolic (congestive) heart failure: Secondary | ICD-10-CM | POA: Diagnosis not present

## 2024-08-28 LAB — CBC
HCT: 25 % — ABNORMAL LOW (ref 36.0–46.0)
Hemoglobin: 8.1 g/dL — ABNORMAL LOW (ref 12.0–15.0)
MCH: 29.9 pg (ref 26.0–34.0)
MCHC: 32.4 g/dL (ref 30.0–36.0)
MCV: 92.3 fL (ref 80.0–100.0)
Platelets: 220 10*3/uL (ref 150–400)
RBC: 2.71 MIL/uL — ABNORMAL LOW (ref 3.87–5.11)
RDW: 16.2 % — ABNORMAL HIGH (ref 11.5–15.5)
WBC: 16.5 10*3/uL — ABNORMAL HIGH (ref 4.0–10.5)
nRBC: 0 % (ref 0.0–0.2)

## 2024-08-28 LAB — GLUCOSE, CAPILLARY
Glucose-Capillary: 191 mg/dL — ABNORMAL HIGH (ref 70–99)
Glucose-Capillary: 228 mg/dL — ABNORMAL HIGH (ref 70–99)
Glucose-Capillary: 270 mg/dL — ABNORMAL HIGH (ref 70–99)
Glucose-Capillary: 143 mg/dL — ABNORMAL HIGH (ref 70–99)

## 2024-08-28 LAB — BASIC METABOLIC PANEL WITH GFR
Anion gap: 14 (ref 5–15)
BUN: 48 mg/dL — ABNORMAL HIGH (ref 6–20)
CO2: 20 mmol/L — ABNORMAL LOW (ref 22–32)
Calcium: 8.4 mg/dL — ABNORMAL LOW (ref 8.9–10.3)
Chloride: 90 mmol/L — ABNORMAL LOW (ref 98–111)
Creatinine, Ser: 1.73 mg/dL — ABNORMAL HIGH (ref 0.44–1.00)
GFR, Estimated: 34 mL/min — ABNORMAL LOW
Glucose, Bld: 174 mg/dL — ABNORMAL HIGH (ref 70–99)
Potassium: 4.8 mmol/L (ref 3.5–5.1)
Sodium: 125 mmol/L — ABNORMAL LOW (ref 135–145)

## 2024-08-28 LAB — MAGNESIUM: Magnesium: 2.3 mg/dL (ref 1.7–2.4)

## 2024-08-28 MED ORDER — FUROSEMIDE 10 MG/ML IJ SOLN
80.0000 mg | Freq: Two times a day (BID) | INTRAMUSCULAR | Status: AC
  Administered 2024-08-28 – 2024-08-29 (×2): 80 mg via INTRAVENOUS
  Filled 2024-08-28 (×2): qty 8

## 2024-08-28 MED ORDER — ALBUMIN HUMAN 25 % IV SOLN
25.0000 g | Freq: Four times a day (QID) | INTRAVENOUS | Status: AC
  Administered 2024-08-28 (×3): 25 g via INTRAVENOUS
  Filled 2024-08-28 (×4): qty 100

## 2024-08-28 MED ORDER — SODIUM CHLORIDE 0.9% FLUSH: 10.0000 mL | INTRAVENOUS | Status: AC | PRN

## 2024-08-28 MED ORDER — FUROSEMIDE 10 MG/ML IJ SOLN
80.0000 mg | Freq: Two times a day (BID) | INTRAMUSCULAR | Status: DC
  Administered 2024-08-28: 80 mg via INTRAVENOUS
  Filled 2024-08-28: qty 8

## 2024-08-28 NOTE — Plan of Care (Signed)
 " Problem: Education: Goal: Knowledge of General Education information will improve Description: Including pain rating scale, medication(s)/side effects and non-pharmacologic comfort measures 08/28/2024 1653 by Dallie Waddell SAUNDERS, RN Outcome: Progressing 08/28/2024 1506 by Dallie Waddell SAUNDERS, RN Outcome: Progressing   Problem: Health Behavior/Discharge Planning: Goal: Ability to manage health-related needs will improve 08/28/2024 1653 by Dallie Waddell SAUNDERS, RN Outcome: Progressing 08/28/2024 1506 by Dallie Waddell SAUNDERS, RN Outcome: Progressing   Problem: Clinical Measurements: Goal: Ability to maintain clinical measurements within normal limits will improve 08/28/2024 1653 by Dallie Waddell SAUNDERS, RN Outcome: Progressing 08/28/2024 1506 by Dallie Waddell SAUNDERS, RN Outcome: Progressing Goal: Will remain free from infection 08/28/2024 1653 by Dallie Waddell SAUNDERS, RN Outcome: Progressing 08/28/2024 1506 by Dallie Waddell SAUNDERS, RN Outcome: Progressing Goal: Diagnostic test results will improve 08/28/2024 1653 by Dallie Waddell SAUNDERS, RN Outcome: Progressing 08/28/2024 1506 by Dallie Waddell SAUNDERS, RN Outcome: Progressing Goal: Respiratory complications will improve 08/28/2024 1653 by Dallie Waddell SAUNDERS, RN Outcome: Progressing 08/28/2024 1506 by Dallie Waddell SAUNDERS, RN Outcome: Progressing Goal: Cardiovascular complication will be avoided 08/28/2024 1653 by Dallie Waddell SAUNDERS, RN Outcome: Progressing 08/28/2024 1506 by Dallie Waddell SAUNDERS, RN Outcome: Progressing   Problem: Activity: Goal: Risk for activity intolerance will decrease 08/28/2024 1653 by Dallie Waddell SAUNDERS, RN Outcome: Progressing 08/28/2024 1506 by Dallie Waddell SAUNDERS, RN Outcome: Progressing   Problem: Nutrition: Goal: Adequate nutrition will be maintained 08/28/2024 1653 by Dallie Waddell SAUNDERS, RN Outcome: Progressing 08/28/2024 1506 by Dallie Waddell SAUNDERS, RN Outcome: Progressing   Problem: Coping: Goal: Level of anxiety will decrease 08/28/2024 1653 by Dallie Waddell SAUNDERS, RN Outcome:  Progressing 08/28/2024 1506 by Dallie Waddell SAUNDERS, RN Outcome: Progressing   Problem: Elimination: Goal: Will not experience complications related to bowel motility 08/28/2024 1653 by Dallie Waddell SAUNDERS, RN Outcome: Progressing 08/28/2024 1506 by Dallie Waddell SAUNDERS, RN Outcome: Progressing Goal: Will not experience complications related to urinary retention 08/28/2024 1653 by Dallie Waddell SAUNDERS, RN Outcome: Progressing 08/28/2024 1506 by Dallie Waddell SAUNDERS, RN Outcome: Progressing   Problem: Pain Managment: Goal: General experience of comfort will improve and/or be controlled 08/28/2024 1653 by Dallie Waddell SAUNDERS, RN Outcome: Progressing 08/28/2024 1506 by Dallie Waddell SAUNDERS, RN Outcome: Progressing   Problem: Safety: Goal: Ability to remain free from injury will improve 08/28/2024 1653 by Dallie Waddell SAUNDERS, RN Outcome: Progressing 08/28/2024 1506 by Dallie Waddell SAUNDERS, RN Outcome: Progressing   Problem: Skin Integrity: Goal: Risk for impaired skin integrity will decrease 08/28/2024 1653 by Dallie Waddell SAUNDERS, RN Outcome: Progressing 08/28/2024 1506 by Dallie Waddell SAUNDERS, RN Outcome: Progressing   Problem: Education: Goal: Ability to describe self-care measures that may prevent or decrease complications (Diabetes Survival Skills Education) will improve 08/28/2024 1653 by Dallie Waddell SAUNDERS, RN Outcome: Progressing 08/28/2024 1506 by Dallie Waddell SAUNDERS, RN Outcome: Progressing Goal: Individualized Educational Video(s) 08/28/2024 1653 by Dallie Waddell SAUNDERS, RN Outcome: Progressing 08/28/2024 1506 by Dallie Waddell SAUNDERS, RN Outcome: Progressing   Problem: Coping: Goal: Ability to adjust to condition or change in health will improve 08/28/2024 1653 by Dallie Waddell SAUNDERS, RN Outcome: Progressing 08/28/2024 1506 by Dallie Waddell SAUNDERS, RN Outcome: Progressing   Problem: Fluid Volume: Goal: Ability to maintain a balanced intake and output will improve 08/28/2024 1653 by Dallie Waddell SAUNDERS, RN Outcome: Progressing 08/28/2024 1506 by Dallie Waddell SAUNDERS,  RN Outcome: Progressing   Problem: Health Behavior/Discharge Planning: Goal: Ability to identify and utilize available resources and services will improve 08/28/2024 1653 by Dallie Waddell SAUNDERS, RN Outcome: Progressing 08/28/2024 1506  by Dallie Waddell SAUNDERS, RN Outcome: Progressing Goal: Ability to manage health-related needs will improve 08/28/2024 1653 by Dallie Waddell SAUNDERS, RN Outcome: Progressing 08/28/2024 1506 by Dallie Waddell SAUNDERS, RN Outcome: Progressing   Problem: Metabolic: Goal: Ability to maintain appropriate glucose levels will improve 08/28/2024 1653 by Dallie Waddell SAUNDERS, RN Outcome: Progressing 08/28/2024 1506 by Dallie Waddell SAUNDERS, RN Outcome: Progressing   Problem: Nutritional: Goal: Maintenance of adequate nutrition will improve 08/28/2024 1653 by Dallie Waddell SAUNDERS, RN Outcome: Progressing 08/28/2024 1506 by Dallie Waddell SAUNDERS, RN Outcome: Progressing Goal: Progress toward achieving an optimal weight will improve 08/28/2024 1653 by Dallie Waddell SAUNDERS, RN Outcome: Progressing 08/28/2024 1506 by Dallie Waddell SAUNDERS, RN Outcome: Progressing   Problem: Skin Integrity: Goal: Risk for impaired skin integrity will decrease 08/28/2024 1653 by Dallie Waddell SAUNDERS, RN Outcome: Progressing 08/28/2024 1506 by Dallie Waddell SAUNDERS, RN Outcome: Progressing   Problem: Tissue Perfusion: Goal: Adequacy of tissue perfusion will improve 08/28/2024 1653 by Dallie Waddell SAUNDERS, RN Outcome: Progressing 08/28/2024 1506 by Dallie Waddell SAUNDERS, RN Outcome: Progressing   Problem: Education: Goal: Knowledge of disease or condition will improve 08/28/2024 1653 by Dallie Waddell SAUNDERS, RN Outcome: Progressing 08/28/2024 1506 by Dallie Waddell SAUNDERS, RN Outcome: Progressing Goal: Knowledge of secondary prevention will improve (MUST DOCUMENT ALL) 08/28/2024 1653 by Dallie Waddell SAUNDERS, RN Outcome: Progressing 08/28/2024 1506 by Dallie Waddell SAUNDERS, RN Outcome: Progressing Goal: Knowledge of patient specific risk factors will improve (DELETE if not current risk  factor) 08/28/2024 1653 by Dallie Waddell SAUNDERS, RN Outcome: Progressing 08/28/2024 1506 by Dallie Waddell SAUNDERS, RN Outcome: Progressing   Problem: Ischemic Stroke/TIA Tissue Perfusion: Goal: Complications of ischemic stroke/TIA will be minimized 08/28/2024 1653 by Dallie Waddell SAUNDERS, RN Outcome: Progressing 08/28/2024 1506 by Dallie Waddell SAUNDERS, RN Outcome: Progressing   Problem: Coping: Goal: Will verbalize positive feelings about self 08/28/2024 1653 by Dallie Waddell SAUNDERS, RN Outcome: Progressing 08/28/2024 1506 by Dallie Waddell SAUNDERS, RN Outcome: Progressing Goal: Will identify appropriate support needs 08/28/2024 1653 by Dallie Waddell SAUNDERS, RN Outcome: Progressing 08/28/2024 1506 by Dallie Waddell SAUNDERS, RN Outcome: Progressing   Problem: Health Behavior/Discharge Planning: Goal: Ability to manage health-related needs will improve 08/28/2024 1653 by Dallie Waddell SAUNDERS, RN Outcome: Progressing 08/28/2024 1506 by Dallie Waddell SAUNDERS, RN Outcome: Progressing Goal: Goals will be collaboratively established with patient/family 08/28/2024 1653 by Dallie Waddell SAUNDERS, RN Outcome: Progressing 08/28/2024 1506 by Dallie Waddell SAUNDERS, RN Outcome: Progressing   Problem: Self-Care: Goal: Ability to participate in self-care as condition permits will improve 08/28/2024 1653 by Dallie Waddell SAUNDERS, RN Outcome: Progressing 08/28/2024 1506 by Dallie Waddell SAUNDERS, RN Outcome: Progressing Goal: Verbalization of feelings and concerns over difficulty with self-care will improve 08/28/2024 1653 by Dallie Waddell SAUNDERS, RN Outcome: Progressing 08/28/2024 1506 by Dallie Waddell SAUNDERS, RN Outcome: Progressing Goal: Ability to communicate needs accurately will improve 08/28/2024 1653 by Dallie Waddell SAUNDERS, RN Outcome: Progressing 08/28/2024 1506 by Dallie Waddell SAUNDERS, RN Outcome: Progressing   Problem: Nutrition: Goal: Risk of aspiration will decrease 08/28/2024 1653 by Dallie Waddell SAUNDERS, RN Outcome: Progressing 08/28/2024 1506 by Dallie Waddell SAUNDERS, RN Outcome: Progressing Goal: Dietary  intake will improve 08/28/2024 1653 by Dallie Waddell SAUNDERS, RN Outcome: Progressing 08/28/2024 1506 by Dallie Waddell SAUNDERS, RN Outcome: Progressing   "

## 2024-08-28 NOTE — Plan of Care (Signed)

## 2024-08-28 NOTE — Progress Notes (Signed)

## 2024-08-28 NOTE — Progress Notes (Addendum)
 Physical Therapy Treatment Patient Details Name: [name] MRN: [id number] DOB: 1971/07/12 Today's Date: 08/28/2024   History of Present Illness 54 y.o. female admitted to [facility] 08/15/24 with AMS, acute decompensated HF, L-side weakness. Pt less responsive 1/21; head CT with R caudate infarct; briefly made comfort care but then status improved. Transfer to [facility] 08/19/24 with progressive renal failure. MRI 1/24 showed multiple acute infarcts in bilateral frontal, parietal and occipital lobes, right basal ganglia, left thalamus and left cerebellum, possibly embolic. PMH includes HFrEF (30-35%), PVCs, tobacco abuse, HTN, PAD s/p bilateral AKA, CKD III, HLD, PVD, PE, asthma.   PT Comments  Pt slowly progressing with mobility. Pt frequently rolling self to R-side with rail support at supervision-level, otherwise requiring maxA+2 for bed mobility, repositioning and attempt to sit EOB; tolerating brief bouts of unsupported long sitting in bed with rail support. Pt with inconsistent participation in conversation and command following; difficult to determine extent of true cognitive impairment vs cognitive deficits exacerbated by pain/fatigue/desire to participate. Pt limited by generalized weakness, pain, decreased activity tolerance, poor balance strategies/postural reactions, and impaired cognition. Continue to recommend post-acute rehab (< 3 hrs/day) to maximize functional mobility and independence.     If plan is discharge home, recommend the following: Two people to help with walking and/or transfers;A lot of help with bathing/dressing/bathroom;Assistance with cooking/housework;Assist for transportation;Direct supervision/assist for medications management;Direct supervision/assist for financial management;Help with stairs or ramp for entrance;Supervision due to cognitive status   Can travel by private vehicle     No  Equipment Recommendations  Hoyer lift;Hospital bed (with amputee sling)     Recommendations for Other Services       Precautions / Restrictions Precautions Precautions: Fall;Other (comment) Recall of Precautions/Restrictions: Impaired Precaution/Restrictions Comments: h/o bilat AKAs; urinary incontinence Restrictions Weight Bearing Restrictions Per Provider Order: No     Mobility  Bed Mobility Overal bed mobility: Needs Assistance Bed Mobility: Rolling, Sidelying to Sit Rolling: Contact guard assist, Mod assist Sidelying to sit: Max assist, +2 for physical assistance, +2 for safety/equipment, Used rails     Sit to sidelying: Max assist, +2 for physical assistance General bed mobility comments: pt frequently rolling self to R-side without assist, use of bed rail, often doing this when asked to perform a diff bed mob task; modA to roll self to L-side, maxA+2 sidelying<>sit, pt with decreased awareness of rolling off L side bed requiring maxA to prevent this; significant increased time and effort. pt able to elevate trunk from EOB with use of bed rails and modA+2; tolerates prolonged long sitting progressing to supervision-level    Transfers                   General transfer comment: unable this session    Ambulation/Gait                   Stairs             Wheelchair Mobility     Tilt Bed    Modified Rankin (Stroke Patients Only)       Balance Overall balance assessment: Needs assistance Sitting-balance support: Bilateral upper extremity supported, Single extremity supported Sitting balance-Leahy Scale: Poor Sitting balance - Comments: pt unable to maintain balance sitting EOB requiring maxA+2 to prevent fall from edge of bed. pt tolerates prolonged unsupported long sitting in bed with rail support, unable to accept challenge  Communication Communication Communication: Impaired Factors Affecting Communication: Difficulty expressing self  Cognition Arousal:  Alert Behavior During Therapy: Flat affect   PT - Cognitive impairments: No family/caregiver present to determine baseline, Attention, Sequencing, Problem solving, Safety/Judgement, Awareness, Initiation                       PT - Cognition Comments: pt inconsistently engaging in conversation, inconsistent command following, at times speaking clearly and appropriately, other times requiring >60 sec to complete task with repeated cues; difficult to determine extent of true cognitive impairment vs exacerbated by fatigue/pain/decreased desire to participate(?) Following commands: Impaired Following commands impaired: Follows one step commands inconsistently, Follows one step commands with increased time    Cueing Cueing Techniques: Gestural cues, Verbal cues, Tactile cues  Exercises      General Comments General comments (skin integrity, edema, etc.): pt clearly and appropriately talking about her pain, otherwise not forthcoming in conversation; RN present during session to give pain meds. multiple attempts to engage in session, mobility progression and goals of care, but pt not forthcoming      Pertinent Vitals/Pain Pain Assessment Pain Assessment: Faces Faces Pain Scale: Hurts whole lot Pain Location: back and L side Pain Descriptors / Indicators: Discomfort, Moaning Pain Intervention(s): Monitored during session, Limited activity within patient's tolerance, Patient requesting pain meds-RN notified, RN gave pain meds during session, Repositioned    Home Living                          Prior Function            PT Goals (current goals can now be found in the care plan section) Progress towards PT goals: Progressing toward goals (slowly)    Frequency    Min 2X/week      PT Plan      Co-evaluation PT/OT/SLP Co-Evaluation/Treatment: Yes Reason for Co-Treatment: Complexity of the patient's impairments (multi-system involvement);Necessary to address  cognition/behavior during functional activity;For patient/therapist safety;To address functional/ADL transfers PT goals addressed during session: Mobility/safety with mobility;Balance;Strengthening/ROM OT goals addressed during session: ADL's and self-care      AM-PAC PT 6 Clicks Mobility   Outcome Measure  Help needed turning from your back to your side while in a flat bed without using bedrails?: Total Help needed moving from lying on your back to sitting on the side of a flat bed without using bedrails?: Total Help needed moving to and from a bed to a chair (including a wheelchair)?: Total Help needed standing up from a chair using your arms (e.g., wheelchair or bedside chair)?: Total Help needed to walk in hospital room?: Total Help needed climbing 3-5 steps with a railing? : Total 6 Click Score: 6    End of Session   Activity Tolerance: Patient limited by fatigue;Other (comment) (patient limited by impaired cognition?) Patient left: in bed;with call bell/phone within reach;with bed alarm set;with nursing/sitter in room Nurse Communication: Mobility status PT Visit Diagnosis: Unsteadiness on feet (R26.81);Other abnormalities of gait and mobility (R26.89);Muscle weakness (generalized) (M62.81);Hemiplegia and hemiparesis Hemiplegia - caused by: Cerebral infarction     Time: 8943-8878 PT Time Calculation (min) (ACUTE ONLY): 25 min  Charges:    $Therapeutic Activity: 8-22 mins PT General Charges $$ ACUTE PT VISIT: 1 Visit                      Darice Almas, PT, DPT Acute Rehabilitation Services  Personal: [facility]  Office: [phone]  Hodge Stachnik L Juel Ripley 08/28/2024, 1:44 PM

## 2024-08-28 NOTE — TOC Progression Note (Addendum)
 Transition of Care [facility]) - Progression Note    Patient Details  Name: [name] MRN: [id number] Date of Birth: 1971-07-12  Transition of Care [facility]) CM/SW Contact  Isaiah Public, LCSWA Phone Number: 08/28/2024, 8:27 AM  Clinical Narrative:     CSW awaiting to hear back from [place] with [facility] to check on status of patients insurance authorization for SNF. CSW will continue to follow.  Update- Jon informed CSW she is awaiting to hear back from [place] with admissions at [facility] to check on status of patients insurance authorization.  Update- Jon informed CSW that she heard back from [place] in admissions who informed Jon she will check on status of patients insurance authorization but as of right now beds are full. No current bed available for patient today.  Expected Discharge Plan: Skilled Nursing Facility Barriers to Discharge: Insurance Authorization               Expected Discharge Plan and Services   Discharge Planning Services: CM Consult Post Acute Care Choice: IP Rehab Living arrangements for the past 2 months: Single Family Home                   DME Agency: NA                   Social Drivers of Health (SDOH) Interventions SDOH Screenings   Food Insecurity: No Food Insecurity (08/22/2024)  Housing: Low Risk (08/22/2024)  Transportation Needs: No Transportation Needs (08/22/2024)  Utilities: Not At Risk (08/22/2024)  Alcohol  Screen: Low Risk (03/18/2022)  Depression (PHQ2-9): Medium Risk (03/18/2022)  Financial Resource Strain: Low Risk (08/16/2024)   Received from [facility]  Physical Activity: Inactive (08/16/2024)   Received from [facility]  Social Connections: Socially Isolated (08/16/2024)   Received from [facility]  Stress: No Stress Concern Present (08/16/2024)   Received from [facility]  Tobacco Use: High Risk (08/22/2024)  Health Literacy: Medium Risk (08/16/2024)   Received from [facility]     Readmission Risk Interventions     No data to display

## 2024-08-29 DIAGNOSIS — Z89511 Acquired absence of right leg below knee: Secondary | ICD-10-CM | POA: Diagnosis not present

## 2024-08-29 DIAGNOSIS — I5021 Acute systolic (congestive) heart failure: Secondary | ICD-10-CM | POA: Diagnosis not present

## 2024-08-29 DIAGNOSIS — J9601 Acute respiratory failure with hypoxia: Secondary | ICD-10-CM | POA: Diagnosis not present

## 2024-08-29 DIAGNOSIS — Z89512 Acquired absence of left leg below knee: Secondary | ICD-10-CM | POA: Diagnosis not present

## 2024-08-29 LAB — BASIC METABOLIC PANEL WITH GFR
Anion gap: 15 (ref 5–15)
BUN: 51 mg/dL — ABNORMAL HIGH (ref 6–20)
CO2: 19 mmol/L — ABNORMAL LOW (ref 22–32)
Calcium: 8.6 mg/dL — ABNORMAL LOW (ref 8.9–10.3)
Chloride: 92 mmol/L — ABNORMAL LOW (ref 98–111)
Creatinine, Ser: 1.88 mg/dL — ABNORMAL HIGH (ref 0.44–1.00)
GFR, Estimated: 31 mL/min — ABNORMAL LOW
Glucose, Bld: 92 mg/dL (ref 70–99)
Potassium: 4.8 mmol/L (ref 3.5–5.1)
Sodium: 126 mmol/L — ABNORMAL LOW (ref 135–145)

## 2024-08-29 LAB — GLUCOSE, CAPILLARY
Glucose-Capillary: 97 mg/dL (ref 70–99)
Glucose-Capillary: 191 mg/dL — ABNORMAL HIGH (ref 70–99)
Glucose-Capillary: 150 mg/dL — ABNORMAL HIGH (ref 70–99)
Glucose-Capillary: 198 mg/dL — ABNORMAL HIGH (ref 70–99)

## 2024-08-29 LAB — VANCOMYCIN, PEAK: Vancomycin Pk: 47 ug/mL — ABNORMAL HIGH (ref 30–40)

## 2024-08-29 MED ORDER — INSULIN GLARGINE 100 UNIT/ML ~~LOC~~ SOLN
10.0000 [IU] | Freq: Every day | SUBCUTANEOUS | Status: DC
  Administered 2024-08-29 – 2024-08-31 (×3): 10 [IU] via SUBCUTANEOUS
  Filled 2024-08-29 (×3): qty 0.1

## 2024-08-29 MED ORDER — OXYCODONE HCL 5 MG PO TABS
2.5000 mg | ORAL_TABLET | Freq: Four times a day (QID) | ORAL | Status: AC | PRN
  Filled 2024-08-29: qty 1

## 2024-08-29 MED ORDER — OXYCODONE HCL 5 MG PO TABS
5.0000 mg | ORAL_TABLET | ORAL | Status: AC | PRN
  Administered 2024-08-30 – 2024-08-31 (×4): 5 mg via ORAL
  Filled 2024-08-29 (×4): qty 1

## 2024-08-29 MED ORDER — FUROSEMIDE 10 MG/ML IJ SOLN
120.0000 mg | Freq: Four times a day (QID) | INTRAVENOUS | Status: AC
  Administered 2024-08-29 (×2): 120 mg via INTRAVENOUS
  Filled 2024-08-29: qty 120
  Filled 2024-08-29: qty 10

## 2024-08-29 MED ORDER — ACETAMINOPHEN 500 MG PO TABS
1000.0000 mg | ORAL_TABLET | Freq: Three times a day (TID) | ORAL | Status: AC
  Administered 2024-08-29 – 2024-09-01 (×11): 1000 mg via ORAL
  Filled 2024-08-29 (×11): qty 2

## 2024-08-29 MED ORDER — VANCOMYCIN VARIABLE DOSE PER UNSTABLE RENAL FUNCTION (PHARMACIST DOSING): Status: DC

## 2024-08-29 MED ORDER — ALPRAZOLAM 0.5 MG PO TABS: 0.5000 mg | ORAL_TABLET | Freq: Two times a day (BID) | ORAL | Status: DC | PRN

## 2024-08-29 NOTE — Progress Notes (Signed)
 Speech Language Pathology Treatment: Cognitive-Linguistic  Patient Details Name: [name] MRN: [id number] DOB: 03-19-1971 Today's Date: 08/29/2024 Time: 1010-1027 SLP Time Calculation (min) (ACUTE ONLY): 17 min  Assessment / Plan / Recommendation Clinical Impression  PLAN: Continue SLP to address aphasia.   Pt much more alert since last seen by SLP.  Communication now reveals a higher degree of aphasia than could be identified previously.  [name] was in significant pain this am, reaching for her stomach, moaning, and able to say my stomach hurts.  Nursing notified.  Pt has difficulty with word retrieval, tending to say words repeatedly, in a perseverative pattern, and unable to shift her speech when necessary (for example, repeated ice water  long after the target of conversation changed.)  With modeling, she was able to say her name, DOB, location, but required ongoing cues to shift word production and unlock perseverative speech pattern. She needed constant visual and verbal cues to follow instructions in order to reposition in bed and hold a cup for drinking.   Session ended due to pain interfering - RN informed as stated earlier.  SLP will continue to follow- discussed my concerns re: severe communication impairment, poor PO intake with [name], Palliative Care.    HPI HPI: 54 y.o. female presents to [facility] 08/19/24 from [facility] for evaluation of progressive renal failure. Pt also with AMS w/ L sided weakness and acute on chronic CHF exacerbation. MRI brain showed multiple acute infarcts in bilateral frontal, parietal and occipital lobes, right basal ganglia, left thalamus and left cerebellum, possibly embolic. PMHx: HFrEF (30-35%), PVCs, tobacco abuse, HTN, PAD, bilateral AKA, CKD III, HLD, PVD      SLP Plan  Continue with current plan of care;Goals updated        Swallow Evaluation Recommendations         Recommendations                      Oral care BID   Frequent or constant Supervision/Assistance Cognitive communication deficit (R41.841)     Continue with current plan of care;Goals updated   [name] L. [place], MA CCC/SLP Clinical Specialist - Acute Care SLP Acute Rehabilitation Services Office number [phone]   [name]  08/29/2024, 10:44 AM

## 2024-08-30 DIAGNOSIS — J449 Chronic obstructive pulmonary disease, unspecified: Secondary | ICD-10-CM

## 2024-08-30 DIAGNOSIS — I10 Essential (primary) hypertension: Secondary | ICD-10-CM | POA: Diagnosis not present

## 2024-08-30 DIAGNOSIS — I6340 Cerebral infarction due to embolism of unspecified cerebral artery: Secondary | ICD-10-CM | POA: Diagnosis not present

## 2024-08-30 DIAGNOSIS — N1832 Chronic kidney disease, stage 3b: Secondary | ICD-10-CM | POA: Diagnosis not present

## 2024-08-30 DIAGNOSIS — Z89511 Acquired absence of right leg below knee: Secondary | ICD-10-CM | POA: Diagnosis not present

## 2024-08-30 DIAGNOSIS — I5033 Acute on chronic diastolic (congestive) heart failure: Secondary | ICD-10-CM

## 2024-08-30 DIAGNOSIS — Z89512 Acquired absence of left leg below knee: Secondary | ICD-10-CM | POA: Diagnosis not present

## 2024-08-30 LAB — GLUCOSE, CAPILLARY
Glucose-Capillary: 129 mg/dL — ABNORMAL HIGH (ref 70–99)
Glucose-Capillary: 220 mg/dL — ABNORMAL HIGH (ref 70–99)
Glucose-Capillary: 263 mg/dL — ABNORMAL HIGH (ref 70–99)
Glucose-Capillary: 306 mg/dL — ABNORMAL HIGH (ref 70–99)

## 2024-08-30 LAB — CBC
HCT: 23.6 % — ABNORMAL LOW (ref 36.0–46.0)
Hemoglobin: 7.8 g/dL — ABNORMAL LOW (ref 12.0–15.0)
MCH: 30.5 pg (ref 26.0–34.0)
MCHC: 33.1 g/dL (ref 30.0–36.0)
MCV: 92.2 fL (ref 80.0–100.0)
Platelets: 223 10*3/uL (ref 150–400)
RBC: 2.56 MIL/uL — ABNORMAL LOW (ref 3.87–5.11)
RDW: 16.6 % — ABNORMAL HIGH (ref 11.5–15.5)
WBC: 12.4 10*3/uL — ABNORMAL HIGH (ref 4.0–10.5)
nRBC: 0 % (ref 0.0–0.2)

## 2024-08-30 LAB — BASIC METABOLIC PANEL WITH GFR
Anion gap: 13 (ref 5–15)
BUN: 53 mg/dL — ABNORMAL HIGH (ref 6–20)
CO2: 22 mmol/L (ref 22–32)
Calcium: 8.6 mg/dL — ABNORMAL LOW (ref 8.9–10.3)
Chloride: 92 mmol/L — ABNORMAL LOW (ref 98–111)
Creatinine, Ser: 1.8 mg/dL — ABNORMAL HIGH (ref 0.44–1.00)
GFR, Estimated: 33 mL/min — ABNORMAL LOW
Glucose, Bld: 125 mg/dL — ABNORMAL HIGH (ref 70–99)
Potassium: 4.1 mmol/L (ref 3.5–5.1)
Sodium: 127 mmol/L — ABNORMAL LOW (ref 135–145)

## 2024-08-30 LAB — VANCOMYCIN, TROUGH: Vancomycin Tr: 37 ug/mL (ref 15–20)

## 2024-08-30 MED ORDER — FUROSEMIDE 10 MG/ML IJ SOLN
120.0000 mg | Freq: Four times a day (QID) | INTRAVENOUS | Status: AC
  Administered 2024-08-30 (×3): 120 mg via INTRAVENOUS
  Filled 2024-08-30: qty 120
  Filled 2024-08-30 (×2): qty 10
  Filled 2024-08-30: qty 120

## 2024-08-30 NOTE — Assessment & Plan Note (Signed)
 Continue bronchodilator therapy.

## 2024-08-30 NOTE — TOC Progression Note (Addendum)
 Transition of Care [facility]) - Progression Note    Patient Details  Name: [name] MRN: [id number] Date of Birth: August 02, 1970  [facility]) CM/SW Contact  Isaiah Public, LCSWA Phone Number: 08/30/2024, 10:06 AM  Clinical Narrative:     Jon armin Berber with [facility] informed CSW that patients insurance authorization has been approved. Facility informed CSW that patient has a bed at [facility] today if medically ready. CSW informed MD. CSW will continue to follow.  Update- MD informed CSW patient not medically ready today. CSW informed Jon and [place] with [facility].  Expected Discharge Plan: Skilled Nursing Facility Barriers to Discharge: Insurance Authorization               Expected Discharge Plan and Services   Discharge Planning Services: CM Consult Post Acute Care Choice: IP Rehab Living arrangements for the past 2 months: Single Family Home                   DME Agency: NA                   Social Drivers of Health (SDOH) Interventions SDOH Screenings   Food Insecurity: No Food Insecurity (08/22/2024)  Housing: Low Risk (08/22/2024)  Transportation Needs: No Transportation Needs (08/22/2024)  Utilities: Not At Risk (08/22/2024)  Alcohol  Screen: Low Risk (03/18/2022)  Depression (PHQ2-9): Medium Risk (03/18/2022)  Financial Resource Strain: Low Risk (08/16/2024)   Received from [facility]  Physical Activity: Inactive (08/16/2024)   Received from [facility]  Social Connections: Socially Isolated (08/16/2024)   Received from [facility]  Stress: No Stress Concern Present (08/16/2024)   Received from [facility]  Tobacco Use: High Risk (08/22/2024)  Health Literacy: Medium Risk (08/16/2024)   Received from [facility]    Readmission Risk Interventions     No data to display

## 2024-08-30 NOTE — Assessment & Plan Note (Signed)
 Echocardiogram with preserved LV systolic function EF 50 to 55%, moderate to severe LV cavity dilatation, severe LVH,  No significant valvular disease.   Continue diuresis with furosemide 

## 2024-08-30 NOTE — Assessment & Plan Note (Addendum)
 MRI of the brain showed bilateral infarct in the frontal parietal lobe, basal ganglia and left thalamus and cerebellum suspect embolic. Neurology was consulted currently on aspirin , statin and Plavix . TEE was deferred. Will need a 30-day heart monitor as an outpatient.  Specially once her mentation is improved. Due to global weakness PT evaluated the patient she will need to go to skilled nursing facility.  Once sodium is improved.  Metabolic encephalopathy, acute delirium, dysphagia.,  Plan to continue quetiapine  at night

## 2024-08-30 NOTE — Assessment & Plan Note (Signed)
 Continue blood pressure monitoring Continue with carvedilol.

## 2024-08-30 NOTE — Progress Notes (Signed)
 Pharmacy Antibiotic Note  [name] is a 54 y.o. female admitted on 08/19/2024 with cellulitis.  Pharmacy has been consulted for vancomycin  dosing.  D#6 antibiotics (D#5 vancomycin , D#4 CFP s/p 1 day of Unasyn ) WBC 12.4 down today SCr peaked 1.88 yesterday > down to 1.89 today.  Blood cultures negative (finalized 1/29)  Vancomycin  1000 mg Q24h regimen (3 doses given) - held yesterday with uptrending SCr and levels obtained VP 47 2/3 @1304  (1g q24h given 0926) VT 37 2/4 @0911   eAUC 707, est half life 25h, Ke 0.027  Plan: Continue variable vancomycin  dosing based on levels  Continue cefepime  2g every 12 hours  Monitor renal fx, cx results, clinical pic, and LOT   Height: 5' 8 (172.7 cm) (prior to bilateral AKA) Weight: 74.8 kg (165 lb) IBW/kg (Calculated) : 63.9  Temp (24hrs), Avg:98 F (36.7 C), Min:97.8 F (36.6 C), Max:98.5 F (36.9 C)  Recent Labs  Lab 08/25/24 0409 08/26/24 0418 08/27/24 0237 08/28/24 0238 08/28/24 1042 08/29/24 0855 08/29/24 1304 08/30/24 0714 08/30/24 0911  WBC 14.2* 16.6*  --   --  16.5*  --   --  12.4*  --   CREATININE 1.40* 1.36* 1.60* 1.73*  --  1.88*  --  1.80*  --   VANCOTROUGH  --   --   --   --   --   --   --   --  37*  VANCOPEAK  --   --   --   --   --   --  47*  --   --     Estimated Creatinine Clearance: 35.6 mL/min (A) (by C-G formula based on SCr of 1.8 mg/dL (H)).    Allergies[1]   Microbiology results: 1/24 BCx: ngF  Thank you for allowing pharmacy to participate in this patient's care,  Maurilio Fila, PharmD Clinical Pharmacist 08/30/2024  2:02 PM    [1] No Known Allergies

## 2024-08-30 NOTE — Assessment & Plan Note (Signed)
 Hyponatremia.  Patient placed on furosemide  for diuresis   Follow up renal function with serum cr at 1.80 with K at 4.1 and serum bicarbonate at 22  Na 127   Plan to continue diuresis and follow up renal function and electrolytes in am

## 2024-08-30 NOTE — Progress Notes (Addendum)
 " Progress Note   Patient: [name] FMW:[id number] DOB: [birth year] DOA: 08/19/2024     11 DOS: the patient was seen and examined on 08/30/2024   Brief hospital course: DULCEMARIA BULA is an 54 y.o. female past medical history of heart failure, essential hypertension, bilateral AKA, chronic kidney disease stage IIIb transferred from [facility] due to progressive renal failure, initially admitted on 08/15/2024 for acute decompensated heart failure, altered mental status and left-sided weakness for concern for stroke she did develop progressive renal failure and elevated troponins.   On presentation she was confused code stroke was called CT of the head showed focal hypodensity in the right caudate concerning for stroke.   MRI of the brain showed an acute ischemic stroke neurology and cardiology were consulted.  Work has been completed PT evaluated the patient, anticipating she will go to skilled nursing facility.   Assessment and Plan: * Cerebrovascular accident (CVA) due to embolism of cerebral artery (HCC) MRI of the brain showed bilateral infarct in the frontal parietal lobe, basal ganglia and left thalamus and cerebellum suspect embolic. Neurology was consulted currently on aspirin , statin and Plavix . TEE was deferred. Will need a 30-day heart monitor as an outpatient.  Specially once her mentation is improved. Due to global weakness PT evaluated the patient she will need to go to skilled nursing facility.  Once sodium is improved.  Metabolic encephalopathy, acute delirium, dysphagia.,  Plan to continue quetiapine  at night  Chronic kidney disease, stage 3b (HCC) Hyponatremia.  Patient placed on furosemide  for diuresis   Follow up renal function with serum cr at 1.80 with K at 4.1 and serum bicarbonate at 22  Na 127   Plan to continue diuresis and follow up renal function and electrolytes in am    Acute on chronic diastolic (congestive) heart failure (HCC) Echocardiogram with  preserved LV systolic function EF 50 to 55%, moderate to severe LV cavity dilatation, severe LVH,  No significant valvular disease.   Continue diuresis with furosemide    Type 2 diabetes mellitus with hyperlipidemia (HCC) Continue glucose cover and monitoring with insulin  sliding scale Continue basal insulin   Continue statin   Hypertension Continue blood pressure monitoring  Continue with carvedilol .   Status post below-knee amputation of both lower extremities (HCC) Continue PT and OT Positive large pressure ulcer stage 3 To my examination no signs of local infection, will stop antibiotic therapy and follow up response Possible reactive leukocytosis   COPD (chronic obstructive pulmonary disease) (HCC) Continue bronchodilator therapy         Subjective: Patient continue to have pain at her pressure wound, no chest pain, no dyspnea, no nausea or vomiting, continue very weak and deconditioned   Physical Exam: Vitals:   08/29/24 1648 08/29/24 2014 08/30/24 0013 08/30/24 0400  BP:  (!) 148/61 (!) 140/85 (!) 125/58  Pulse: 79   65  Resp:    17  Temp:  98.5 F (36.9 C) 97.9 F (36.6 C) 97.8 F (36.6 C)  TempSrc:  Oral Oral Oral  SpO2: 100%     Weight:    74.8 kg  Height:       Neurology awake and alert, deconditioned ENT with mild pallor with no icterus Cardiovascular with S1 and S2 present and regular with no gallops or rubs Respiratory with no wheezing or rhonchi Abdomen with no distention, soft and non tender No lower extremity edema     Data Reviewed:    Family Communication: no family at the bedside  Disposition: Status is: Inpatient Remains inpatient appropriate because: IV furosemide    Planned Discharge Destination: Home     Author: Elidia Toribio Furnace, MD 08/30/2024 10:56 AM  For on call review [email or web address].  "

## 2024-08-30 NOTE — Assessment & Plan Note (Signed)
 Continue PT and OT Positive large pressure ulcer stage 3 To my examination no signs of local infection, will stop antibiotic therapy and follow up response Possible reactive leukocytosis

## 2024-08-30 NOTE — Consult Note (Signed)
 Nephrology Follow-Up Consult note   Assessment/Recommendations: [name] is a/an 54 y.o. female with a past medical history significant for HFmrEF (50-55%), essential HTN, PAD s/p B/L AKA, CKD stage IIIb with progressive renal failure with decreasing urine output and increasing creatinine.  Admitted for acute CVA and suddenly became hyponatremic.  Likely hypervolemic hyponatremia secondary to history of HFmrEF.  Diuresed aggressively with IV Lasix  120 mg x 2 doses yesterday, output 450 mL yesterday.  Creatinine improved slightly from 1.88 >1.80.  Sodium improved from 126 > 127.  Will continue diuresis with IV Lasix  120 mg x 3 doses today, appreciate nursing's assistance with documenting strict I/O.   Hypervolemic Hyponatremia Progressive Oliguria AKI  CKD Stage IIIb Patient is euvolemic on exam.  Weight continues to rise, up 33 pounds since admission unsure if this is accurate.  Urine studies pending.  Creatinine 1.8 (baseline 1.7-2.1), GFR 33 (baseline 26-35), Bicarb 22.  Continues to get IV Lasix  at this time with improvement in her creatinine. - Continue IV Lasix  120 mg every 6 hours for 3 doses - Chart reviewed: (medications acceptable, does not appear to have been exposed to nephrotoxins with imaging or had episodes of significant hypotension).   - Continue to monitor daily Cr, Dose meds for GFR - Monitor Daily I/Os, Daily weight  - Maintain MAP>65 for optimal renal perfusion.  - Avoid nephrotoxic medications including NSAIDs - Use synthetic opioids (Fentanyl /Dilaudid ) if needed   HFmrEF EF improved to 50-55% per Echo from 07/2023 to 07/2024.  Appears mildly volume overloaded on exam.  Weight is up 33 pounds since admission.  Cardiology following, appreciate recommendations.  Holding home Entresto , Alcactone, and Jardiance  d/t AKI from initial over-diuresis.   - Continue IV Lasix  120 mg every 6 hours for 3 doses - Continue Coreg  3.125 mg BID   Anemia of chronic disease -  Transfuse for Hgb <7 g/dL - No role for ESA in this setting   Uncontrolled Diabetes Mellitus Type 2 A1c 8.9. Currently not hyperglycemic.  Insulin  per primary team.   Acute CVA  AMS Acute bilateral infarcts in the frontal parietal lobe, basal ganglia, and left thalamus and cerebral suspected to be embolic.  Neurology on board treating with aspirin , Plavix , and statin.   Leukocytosis Considered secondary to stage IV sacral decubitus ulcers. On IV Vanc and Cefepime .   Recommendations conveyed to primary service.    [name], DO [facility], PGY-2 [facility] 08/30/2024 12:12 PM  ___________________________________________________________  CC: Renal failure  Interval History/Subjective:  - Per nursing patient is having more diarrhea, but had good urine output response to Lasix  - Patient expresses some back and buttock pain, likely from laying in bed; palliative on board to assist with pain regimen - More conversant this morning and was getting cleaned up by nursing  Medications:  Current Facility-Administered Medications  Medication Dose Route Frequency Provider Last Rate Last Admin   acetaminophen  (TYLENOL ) tablet 1,000 mg  1,000 mg Oral TID Ferolito, Michelle Y, NP   1,000 mg at 08/30/24 0909   albuterol  (PROVENTIL ) (2.5 MG/3ML) 0.083% nebulizer solution 2.5 mg  2.5 mg Nebulization Q4H PRN Pahwani, Rinka R, MD       ALPRAZolam  (XANAX ) tablet 0.5 mg  0.5 mg Oral BID PRN Odell Celinda Balo, MD       aspirin  chewable tablet 81 mg  81 mg Oral Daily Jillian Buttery, MD   81 mg at 08/30/24 0910   carvedilol  (COREG ) tablet 3.125 mg  3.125  mg Oral BID WC Garrick Leontine SAILOR, PA-C   3.125 mg at 08/30/24 9090   ceFEPIme  (MAXIPIME ) 2 g in sodium chloride  0.9 % 100 mL IVPB  2 g Intravenous Q12H Odell Celinda Balo, MD 200 mL/hr at 08/30/24 0926 2 g at 08/30/24 9073   clopidogrel  (PLAVIX ) tablet 75 mg  75 mg Oral Daily Adhikari, Amrit, MD   75 mg  at 08/30/24 9090   collagenase  (SANTYL ) ointment   Topical Daily Odell Celinda Balo, MD   Given at 08/30/24 0910   ezetimibe  (ZETIA ) tablet 10 mg  10 mg Oral Daily Garrick Leontine SAILOR, PA-C   10 mg at 08/30/24 9090   furosemide  (LASIX ) 120 mg in dextrose  5 % 50 mL IVPB  120 mg Intravenous Q6H Macel Jayson PARAS, MD 62 mL/hr at 08/30/24 1157 120 mg at 08/30/24 1157   heparin  injection 5,000 Units  5,000 Units Subcutaneous Q8H Pahwani, Rinka R, MD   5,000 Units at 08/30/24 0607   hydrALAZINE  (APRESOLINE ) injection 10 mg  10 mg Intravenous Q6H PRN Pahwani, Rinka R, MD   10 mg at 08/21/24 1200   insulin  aspart (novoLOG ) injection 0-5 Units  0-5 Units Subcutaneous QHS Odell Celinda Balo, MD   2 Units at 08/27/24 2240   insulin  aspart (novoLOG ) injection 0-9 Units  0-9 Units Subcutaneous TID WC Odell Celinda Balo, MD   1 Units at 08/30/24 9090   insulin  aspart (novoLOG ) injection 3 Units  3 Units Subcutaneous TID WC Odell Celinda Balo, MD   3 Units at 08/30/24 9090   insulin  glargine (LANTUS ) injection 10 Units  10 Units Subcutaneous Daily Odell Celinda Balo, MD   10 Units at 08/30/24 0908   lactose free nutrition (BOOST PLUS) liquid 237 mL  237 mL Oral TID WC Jillian Buttery, MD   237 mL at 08/30/24 0911   multivitamin with minerals tablet 1 tablet  1 tablet Oral Daily Jillian Buttery, MD   1 tablet at 08/30/24 0909   nicotine  (NICODERM CQ  - dosed in mg/24 hours) patch 21 mg  21 mg Transdermal Daily Jillian Buttery, MD   21 mg at 08/30/24 0908   ondansetron  (ZOFRAN ) tablet 4 mg  4 mg Oral Q6H PRN Pahwani, Rinka R, MD       Or   ondansetron  (ZOFRAN ) injection 4 mg  4 mg Intravenous Q6H PRN Pahwani, Rinka R, MD       oxyCODONE  (Oxy IR/ROXICODONE ) immediate release tablet 2.5 mg  2.5 mg Oral Q6H PRN Ferolito, Michelle Y, NP       oxyCODONE  (Oxy IR/ROXICODONE ) immediate release tablet 5 mg  5 mg Oral Q4H PRN Ferolito, Michelle Y, NP   5 mg at 08/30/24 9387   polyethylene glycol (MIRALAX  / GLYCOLAX )  packet 17 g  17 g Oral Daily Jillian Buttery, MD   17 g at 08/27/24 9046   QUEtiapine  (SEROQUEL ) tablet 25 mg  25 mg Oral QHS PRN Odell Celinda Balo, MD       rosuvastatin  (CRESTOR ) tablet 40 mg  40 mg Oral QPM Jillian Buttery, MD   40 mg at 08/29/24 1756   senna-docusate (Senokot-S) tablet 1 tablet  1 tablet Oral BID Jillian Buttery, MD   1 tablet at 08/29/24 2131   sodium chloride  flush (NS) 0.9 % injection 10-40 mL  10-40 mL Intracatheter PRN Odell Celinda Balo, MD       vancomycin  variable dose per unstable renal function (pharmacist dosing)   Does not apply See admin instructions Sherryll Suzen SQUIBB, [facility]  Review of Systems: 10 systems reviewed and negative except per interval history/subjective  Physical Exam: Vitals:   08/30/24 0400 08/30/24 1152  BP: (!) 125/58 (!) 110/57  Pulse: 65 71  Resp: 17 16  Temp: 97.8 F (36.6 C) 97.8 F (36.6 C)  SpO2:  96%   Total I/O In: 480 [P.O.:480] Out: 350 [Urine:350]  Intake/Output Summary (Last 24 hours) at 08/30/2024 1212 Last data filed at 08/30/2024 1152 Gross per 24 hour  Intake 480 ml  Output 800 ml  Net -320 ml   Constitutional: NAD, conversant this morning ENMT: ears and nose without scars or lesions, MMM CV: normal rate, no edema Respiratory: clear to auscultation, normal work of breathing Gastrointestinal: soft, non-tender, non-distended, normoactive bowel sounds Extremities: edema over bilateral AKAs improved since yesterday Skin: Sacral decubitus ulcer  Psych: alert, judgement/insight appropriate, appropriate mood and affect   Test Results I personally reviewed new and old clinical labs and radiology tests Lab Results  Component Value Date   NA 127 (L) 08/30/2024   K 4.1 08/30/2024   CL 92 (L) 08/30/2024   CO2 22 08/30/2024   BUN 53 (H) 08/30/2024   CREATININE 1.80 (H) 08/30/2024   CALCIUM  8.6 (L) 08/30/2024   ALBUMIN  3.0 (L) 08/26/2024   PHOS 6.1 (H) 08/19/2024    CBC Recent Labs  Lab  08/26/24 0418 08/28/24 1042 08/30/24 0714  WBC 16.6* 16.5* 12.4*  HGB 9.2* 8.1* 7.8*  HCT 27.9* 25.0* 23.6*  MCV 91.8 92.3 92.2  PLT 194 220 223

## 2024-08-30 NOTE — Assessment & Plan Note (Signed)
 Continue insulin  80 units daily Insulin  SSI with at bedtime coverage ordered

## 2024-08-30 NOTE — Hospital Course (Signed)
 [name] is an 54 y.o. female past medical history of heart failure, essential hypertension, bilateral AKA, chronic kidney disease stage IIIb transferred from [facility] due to progressive renal failure, initially admitted on 08/15/2024 for acute decompensated heart failure, altered mental status and left-sided weakness for concern for stroke she did develop progressive renal failure and elevated troponins.   On presentation she was confused code stroke was called CT of the head showed focal hypodensity in the right caudate concerning for stroke.   MRI of the brain showed an acute ischemic stroke neurology and cardiology were consulted.  Work has been completed PT evaluated the patient, anticipating she will go to skilled nursing facility.

## 2024-08-30 NOTE — Progress Notes (Signed)
 Physical Therapy Treatment Patient Details Name: [name] MRN: [id number] DOB: 07/01/1971 Today's Date: 08/30/2024   History of Present Illness 54 y.o. female admitted to [facility] 08/15/24 with AMS, acute decompensated HF, L-side weakness. Pt less responsive 1/21; head CT with R caudate infarct; briefly made comfort care but then status improved. Transfer to [facility] 08/19/24 with progressive renal failure. MRI 1/24 showed multiple acute infarcts in bilateral frontal, parietal and occipital lobes, right basal ganglia, left thalamus and left cerebellum, possibly embolic. PMH includes HFrEF (30-35%), PVCs, tobacco abuse, HTN, PAD s/p bilateral AKA, CKD III, HLD, PVD, PE, asthma.    PT Comments  Pt received in supine and agreeable to PT session. Pt continues to be limited by fatigue, impaired cognition and pain in bottom when seated on EOB. Pt has a R lateral and posterior lean requiring 1UE support and ModA/MaxA. Brief moments of CGA when not dual tasking. Able to work on dynamic balance by engaging in hygiene and ADL tasks at EOB. Pt reported having an increased amount of pain in bottom and requested to return to supine. Continue to recommend <3hrs post acute rehab with acute PT to follow.     If plan is discharge home, recommend the following: Two people to help with walking and/or transfers;A lot of help with bathing/dressing/bathroom;Assistance with cooking/housework;Assist for transportation;Direct supervision/assist for medications management;Direct supervision/assist for financial management;Help with stairs or ramp for entrance;Supervision due to cognitive status   Can travel by private vehicle     No  Equipment Recommendations  Hoyer lift;Hospital bed (with amputee sling)       Precautions / Restrictions Precautions Precautions: Fall;Other (comment) Recall of Precautions/Restrictions: Impaired Precaution/Restrictions Comments: h/o bilat AKAs; urinary  incontinence Restrictions Weight Bearing Restrictions Per Provider Order: No     Mobility  Bed Mobility Overal bed mobility: Needs Assistance Bed Mobility: Rolling, Sidelying to Sit, Sit to Sidelying Rolling: Mod assist, Used rails Sidelying to sit: Max assist, +2 for physical assistance, +2 for safety/equipment, Used rails    Sit to sidelying: Max assist, +2 for physical assistance General bed mobility comments: ModA to roll to the right with MaxAx2 to raise trunk. Required assist to scoot forwards towards HOB. MaxAx2 to return to sidelying    Transfers    General transfer comment: pt declined    Modified Rankin (Stroke Patients Only) Modified Rankin (Stroke Patients Only) Pre-Morbid Rankin Score: Slight disability Modified Rankin: Severe disability     Balance Overall balance assessment: Needs assistance Sitting-balance support: Single extremity supported Sitting balance-Leahy Scale: Poor Sitting balance - Comments: brief moments of CGA with pt mainly needing ModA to MaxA for R lateral and posterior lean Postural control: Right lateral lean, Posterior lean       Communication Communication Communication: Impaired Factors Affecting Communication: Difficulty expressing self  Cognition Arousal: Alert Behavior During Therapy: Flat affect   PT - Cognitive impairments: No family/caregiver present to determine baseline, Attention, Sequencing, Problem solving, Safety/Judgement, Awareness, Initiation, Orientation   Orientation impairments: Time, Situation    PT - Cognition Comments: At times would required increased time to respond to questions/commands Following commands: Impaired Following commands impaired: Follows one step commands inconsistently, Follows one step commands with increased time    Cueing Cueing Techniques: Gestural cues, Verbal cues, Tactile cues         Pertinent Vitals/Pain Pain Assessment Pain Assessment: Faces Faces Pain Scale: Hurts whole  lot Pain Location: bottom Pain Descriptors / Indicators: Discomfort, Grimacing Pain Intervention(s): Monitored during session, Limited activity within  patient's tolerance, Repositioned     PT Goals (current goals can now be found in the care plan section) Acute Rehab PT Goals PT Goal Formulation: Patient unable to participate in goal setting Time For Goal Achievement: 09/03/24 Potential to Achieve Goals: Fair Progress towards PT goals: Progressing toward goals    Frequency    Min 2X/week       AM-PAC PT 6 Clicks Mobility   Outcome Measure  Help needed turning from your back to your side while in a flat bed without using bedrails?: A Lot Help needed moving from lying on your back to sitting on the side of a flat bed without using bedrails?: Total Help needed moving to and from a bed to a chair (including a wheelchair)?: Total Help needed standing up from a chair using your arms (e.g., wheelchair or bedside chair)?: Total Help needed to walk in hospital room?: Total Help needed climbing 3-5 steps with a railing? : Total 6 Click Score: 7    End of Session   Activity Tolerance: Patient limited by fatigue Patient left: in bed;with call bell/phone within reach;with bed alarm set Nurse Communication: Mobility status PT Visit Diagnosis: Unsteadiness on feet (R26.81);Other abnormalities of gait and mobility (R26.89);Muscle weakness (generalized) (M62.81);Hemiplegia and hemiparesis Hemiplegia - Right/Left: Left Hemiplegia - caused by: Cerebral infarction     Time: 1010-1033 PT Time Calculation (min) (ACUTE ONLY): 23 min  Charges:    $Therapeutic Activity: 23-37 mins PT General Charges $$ ACUTE PT VISIT: 1 Visit                    Kate ORN, PT, DPT Secure Chat Preferred  Rehab Office [phone]   Kate BRAVO Wendolyn 08/30/2024, 11:53 AM

## 2024-08-31 ENCOUNTER — Encounter (HOSPITAL_COMMUNITY): Payer: Self-pay | Admitting: Internal Medicine

## 2024-08-31 DIAGNOSIS — N1832 Chronic kidney disease, stage 3b: Secondary | ICD-10-CM | POA: Diagnosis not present

## 2024-08-31 DIAGNOSIS — I5033 Acute on chronic diastolic (congestive) heart failure: Secondary | ICD-10-CM | POA: Diagnosis not present

## 2024-08-31 DIAGNOSIS — I6340 Cerebral infarction due to embolism of unspecified cerebral artery: Secondary | ICD-10-CM | POA: Diagnosis not present

## 2024-08-31 DIAGNOSIS — I10 Essential (primary) hypertension: Secondary | ICD-10-CM | POA: Diagnosis not present

## 2024-08-31 LAB — GLUCOSE, CAPILLARY
Glucose-Capillary: 316 mg/dL — ABNORMAL HIGH (ref 70–99)
Glucose-Capillary: 202 mg/dL — ABNORMAL HIGH (ref 70–99)
Glucose-Capillary: 82 mg/dL (ref 70–99)
Glucose-Capillary: 245 mg/dL — ABNORMAL HIGH (ref 70–99)

## 2024-08-31 LAB — RENAL FUNCTION PANEL
Albumin: 3.7 g/dL (ref 3.5–5.0)
Anion gap: 14 (ref 5–15)
BUN: 58 mg/dL — ABNORMAL HIGH (ref 6–20)
CO2: 21 mmol/L — ABNORMAL LOW (ref 22–32)
Calcium: 8.7 mg/dL — ABNORMAL LOW (ref 8.9–10.3)
Chloride: 91 mmol/L — ABNORMAL LOW (ref 98–111)
Creatinine, Ser: 1.92 mg/dL — ABNORMAL HIGH (ref 0.44–1.00)
GFR, Estimated: 30 mL/min — ABNORMAL LOW
Glucose, Bld: 323 mg/dL — ABNORMAL HIGH (ref 70–99)
Phosphorus: 4.1 mg/dL (ref 2.5–4.6)
Potassium: 4.8 mmol/L (ref 3.5–5.1)
Sodium: 127 mmol/L — ABNORMAL LOW (ref 135–145)

## 2024-08-31 LAB — CBC
HCT: 25.2 % — ABNORMAL LOW (ref 36.0–46.0)
Hemoglobin: 8.3 g/dL — ABNORMAL LOW (ref 12.0–15.0)
MCH: 30.5 pg (ref 26.0–34.0)
MCHC: 32.9 g/dL (ref 30.0–36.0)
MCV: 92.6 fL (ref 80.0–100.0)
Platelets: 233 10*3/uL (ref 150–400)
RBC: 2.72 MIL/uL — ABNORMAL LOW (ref 3.87–5.11)
RDW: 16.8 % — ABNORMAL HIGH (ref 11.5–15.5)
WBC: 11 10*3/uL — ABNORMAL HIGH (ref 4.0–10.5)
nRBC: 0 % (ref 0.0–0.2)

## 2024-08-31 LAB — SODIUM, URINE, RANDOM: Sodium, Ur: 72 mmol/L

## 2024-08-31 LAB — OSMOLALITY, URINE: Osmolality, Ur: 291 mosm/kg — ABNORMAL LOW (ref 300–900)

## 2024-08-31 MED ORDER — INSULIN ASPART 100 UNIT/ML IJ SOLN
5.0000 [IU] | Freq: Three times a day (TID) | INTRAMUSCULAR | Status: AC
  Administered 2024-08-31: 5 [IU] via SUBCUTANEOUS
  Filled 2024-08-31: qty 5

## 2024-08-31 MED ORDER — INSULIN GLARGINE 100 UNIT/ML ~~LOC~~ SOLN
15.0000 [IU] | Freq: Every day | SUBCUTANEOUS | Status: AC
  Administered 2024-09-01: 15 [IU] via SUBCUTANEOUS
  Filled 2024-08-31 (×2): qty 0.15

## 2024-08-31 MED ORDER — INSULIN GLARGINE 100 UNIT/ML ~~LOC~~ SOLN
5.0000 [IU] | Freq: Once | SUBCUTANEOUS | Status: AC
  Administered 2024-08-31: 5 [IU] via SUBCUTANEOUS
  Filled 2024-08-31: qty 0.05

## 2024-08-31 MED ORDER — FUROSEMIDE 10 MG/ML IJ SOLN
120.0000 mg | Freq: Four times a day (QID) | INTRAVENOUS | Status: AC
  Administered 2024-08-31 (×3): 120 mg via INTRAVENOUS
  Filled 2024-08-31: qty 12
  Filled 2024-08-31: qty 10
  Filled 2024-08-31: qty 120

## 2024-08-31 MED ORDER — ASPIRIN 81 MG PO TBEC
81.0000 mg | DELAYED_RELEASE_TABLET | Freq: Every day | ORAL | Status: AC
  Administered 2024-08-31 – 2024-09-01 (×2): 81 mg via ORAL
  Filled 2024-08-31 (×2): qty 1

## 2024-08-31 MED ORDER — DULOXETINE HCL 60 MG PO CPEP
60.0000 mg | ORAL_CAPSULE | Freq: Every day | ORAL | Status: AC
  Administered 2024-08-31 – 2024-09-01 (×2): 60 mg via ORAL
  Filled 2024-08-31 (×2): qty 1

## 2024-08-31 MED ORDER — CITALOPRAM HYDROBROMIDE 20 MG PO TABS
20.0000 mg | ORAL_TABLET | Freq: Every day | ORAL | Status: AC
  Administered 2024-08-31 – 2024-09-01 (×2): 20 mg via ORAL
  Filled 2024-08-31 (×2): qty 1

## 2024-08-31 MED ORDER — SALINE SPRAY 0.65 % NA SOLN
1.0000 | NASAL | Status: AC | PRN
  Filled 2024-08-31: qty 44

## 2024-08-31 NOTE — Progress Notes (Cosign Needed Addendum)
 Nephrology Follow-Up Consult note   Assessment/Recommendations: [name] is a/an 54 y.o. female with a past medical history significant for HFmrEF (50-55%), essential HTN, PAD s/p B/L AKA, CKD stage IIIb with progressive renal failure with decreasing urine output and increasing creatinine.  Admitted for acute CVA and suddenly became hyponatremic.  Likely hypervolemic hyponatremia secondary to history of HFmrEF.  Corrected sodium at 132, improved from 128 yesterday.  Creatinine trended up from 1.8 > 1.92 s/p diuresis with IV Lasix  120 mg for 3 doses with true output unknown since Purewick wasn't working last night, however with 4 pounds of weight loss it can be presumed this worked well for her.    Hypervolemic Hyponatremia Progressive Oliguria AKI  CKD Stage IIIb Patient is euvolemic on exam.  Weight decreased slightly by 4 pounds from yesterday, up 29 pounds since admission, unsure if this is accurate.  Urine studies pending.  Creatinine 1.92 (baseline 1.7-2.1), GFR 30 (baseline 26-35), Bicarb 21.  Hyponatremia improving with diuresis.  - Continue IV Lasix  120 mg every 6 hours for 3 doses, may consider transition to PO Torsemide  100 mg BID tomorrow if patient continues to tolerate the diuresis well - Chart reviewed: (medications acceptable, does not appear to have been exposed to nephrotoxins with imaging or had episodes of significant hypotension).   - Continue to monitor daily Cr, Dose meds for GFR - Monitor Daily I/Os, Daily weight  - Maintain MAP>65 for optimal renal perfusion.  - Avoid nephrotoxic medications including NSAIDs - Use synthetic opioids (Fentanyl /Dilaudid ) if needed   HFmrEF EF improved to 50-55% per Echo from 07/2023 to 07/2024.  Weight is up 29 pounds since admission.  Cardiology following, appreciate recommendations.  Holding home Entresto , Alcactone, and Jardiance  d/t AKI from initial over-diuresis.   - Continue IV Lasix  120 mg every 6 hours for 3 doses - Continue  Coreg  3.125 mg BID   Anemia of chronic disease Hgb 8.3 today. - Transfuse for Hgb < 7 g/dL - No role for ESA in this setting   Uncontrolled Diabetes Mellitus Type 2 A1c 8.9. Currently not hyperglycemic.  Insulin  per primary team.   Acute CVA  AMS Acute bilateral infarcts in the frontal parietal lobe, basal ganglia, and left thalamus and cerebral suspected to be embolic.  Neurology on board treating with aspirin , Plavix , and statin.   Leukocytosis Considered secondary to stage IV sacral decubitus ulcers. On IV Vanc and Cefepime .   Recommendations conveyed to primary service.    [name] [facility] 08/31/2024 11:42 AM  ___________________________________________________________  CC: Progressive renal function decline  Interval History/Subjective:  - Patient resting comfortably in bed in NAD - No concerns expressed this morning - Nursing reports Purewick was not working this morning which could be the reason output wasn't recorded overnight  Medications:  Current Facility-Administered Medications  Medication Dose Route Frequency Provider Last Rate Last Admin   acetaminophen  (TYLENOL ) tablet 1,000 mg  1,000 mg Oral TID Ferolito, Michelle Y, NP   1,000 mg at 08/31/24 0848   albuterol  (PROVENTIL ) (2.5 MG/3ML) 0.083% nebulizer solution 2.5 mg  2.5 mg Nebulization Q4H PRN Pahwani, Rinka R, MD       ALPRAZolam  (XANAX ) tablet 0.5 mg  0.5 mg Oral BID PRN Odell Celinda Balo, MD       aspirin  EC tablet 81 mg  81 mg Oral Daily Paytes, Emma U, RPH   81 mg at 08/31/24 0848   carvedilol  (COREG ) tablet 3.125 mg  3.125 mg Oral BID WC Garrick Christians  N, PA-C   3.125 mg at 08/31/24 0848   clopidogrel  (PLAVIX ) tablet 75 mg  75 mg Oral Daily Adhikari, Amrit, MD   75 mg at 08/31/24 0848   collagenase  (SANTYL ) ointment   Topical Daily Odell Celinda Balo, MD   Given at 08/31/24 [phone]   ezetimibe  (ZETIA ) tablet 10 mg  10 mg Oral Daily Garrick Leontine SAILOR, PA-C   10 mg at 08/31/24  0848   furosemide  (LASIX ) 120 mg in dextrose  5 % 50 mL IVPB  120 mg Intravenous Q6H Macel Jayson PARAS, MD 62 mL/hr at 08/31/24 0750 120 mg at 08/31/24 0750   heparin  injection 5,000 Units  5,000 Units Subcutaneous Q8H Pahwani, Rinka R, MD   5,000 Units at 08/31/24 9377   hydrALAZINE  (APRESOLINE ) injection 10 mg  10 mg Intravenous Q6H PRN Pahwani, Rinka R, MD   10 mg at 08/21/24 1200   insulin  aspart (novoLOG ) injection 0-5 Units  0-5 Units Subcutaneous QHS Odell Celinda Balo, MD   4 Units at 08/30/24 2216   insulin  aspart (novoLOG ) injection 0-9 Units  0-9 Units Subcutaneous TID WC Odell Celinda Balo, MD   7 Units at 08/31/24 9150   insulin  aspart (novoLOG ) injection 5 Units  5 Units Subcutaneous TID WC Arrien, Mauricio Daniel, MD       NOREEN ON 09/01/2024] insulin  glargine (LANTUS ) injection 15 Units  15 Units Subcutaneous Daily Arrien, Elidia Sieving, MD       insulin  glargine (LANTUS ) injection 5 Units  5 Units Subcutaneous Once Arrien, Elidia Sieving, MD       lactose free nutrition (BOOST PLUS) liquid 237 mL  237 mL Oral TID WC Jillian Buttery, MD   237 mL at 08/31/24 0850   multivitamin with minerals tablet 1 tablet  1 tablet Oral Daily Adhikari, Amrit, MD   1 tablet at 08/31/24 0848   nicotine  (NICODERM CQ  - dosed in mg/24 hours) patch 21 mg  21 mg Transdermal Daily Jillian Buttery, MD   21 mg at 08/31/24 9145   ondansetron  (ZOFRAN ) tablet 4 mg  4 mg Oral Q6H PRN Pahwani, Rinka R, MD       Or   ondansetron  (ZOFRAN ) injection 4 mg  4 mg Intravenous Q6H PRN Pahwani, Rinka R, MD       oxyCODONE  (Oxy IR/ROXICODONE ) immediate release tablet 2.5 mg  2.5 mg Oral Q6H PRN Ferolito, Michelle Y, NP       oxyCODONE  (Oxy IR/ROXICODONE ) immediate release tablet 5 mg  5 mg Oral Q4H PRN Ferolito, Michelle Y, NP   5 mg at 08/31/24 9378   QUEtiapine  (SEROQUEL ) tablet 25 mg  25 mg Oral QHS PRN Odell Celinda Balo, MD       rosuvastatin  (CRESTOR ) tablet 40 mg  40 mg Oral QPM Jillian Buttery, MD   40 mg at  08/30/24 1631   sodium chloride  flush (NS) 0.9 % injection 10-40 mL  10-40 mL Intracatheter PRN Odell Celinda Balo, MD          Review of Systems: 10 systems reviewed and negative except per interval history/subjective  Physical Exam: Vitals:   08/31/24 0325 08/31/24 0813  BP: 134/62 (!) 160/84  Pulse: 66 72  Resp: 15 17  Temp: 97.7 F (36.5 C) 97.7 F (36.5 C)  SpO2:  96%   Total I/O In: -  Out: 200 [Urine:200]  Intake/Output Summary (Last 24 hours) at 08/31/2024 1142 Last data filed at 08/31/2024 1021 Gross per 24 hour  Intake 480 ml  Output 200 ml  Net 280 ml   Constitutional: NAD ENMT: ears and nose without scars or lesions, MMM CV: normal rate, no edema Respiratory: clear to auscultation, normal work of breathing Gastrointestinal: soft, non-tender, non-distended, normoactive bowel sounds Extremities: no peripheral edema Skin: Sacral decubitus ulcer   Test Results I personally reviewed new and old clinical labs and radiology tests Lab Results  Component Value Date   NA 127 (L) 08/31/2024   K 4.8 08/31/2024   CL 91 (L) 08/31/2024   CO2 21 (L) 08/31/2024   BUN 58 (H) 08/31/2024   CREATININE 1.92 (H) 08/31/2024   CALCIUM  8.7 (L) 08/31/2024   ALBUMIN  3.7 08/31/2024   PHOS 4.1 08/31/2024    CBC Recent Labs  Lab 08/28/24 1042 08/30/24 0714 08/31/24 0454  WBC 16.5* 12.4* 11.0*  HGB 8.1* 7.8* 8.3*  HCT 25.0* 23.6* 25.2*  MCV 92.3 92.2 92.6  PLT 220 223 233

## 2024-08-31 NOTE — Progress Notes (Signed)
 " PROGRESS NOTE    [name]  FMW:[id number] DOB: Sep 02, 1970 DOA: 08/19/2024 PCP: Jeanette Comer BRAVO, PA-C    Brief Narrative:  Pt is a 54 y/o female with a past medical history of type 2 diabetes mellitus, PAD s/p bilateral AKA, essential HTN, COPD, chronic diastolic CHF, stage 3b CKD, anxiety, and depression. She was admitted to [facility] in [place] on 08/15/24, and was transferred to [facility] due to need for multidisciplinary management. She was admitted to [facility] on 08/19/24 with acute on chronic CHF exacerbation, AMS with L sided weakness concerning for stroke, AKI, and elevated troponins. Code stroke was activated and MRI revealed acute multilobar infarcts consistent with embolic etiology. Work up has been completed. Palliative care has spoken to family and they would like for pt to be discharged to SNF.   Assessment & Plan:   Principal Problem:   Cerebrovascular accident (CVA) due to embolism of cerebral artery (HCC) Active Problems:   Hypertension   Acute on chronic diastolic (congestive) heart failure (HCC)   Type 2 diabetes mellitus with hyperlipidemia (HCC)   COPD (chronic obstructive pulmonary disease) (HCC)   Status post below-knee amputation of both lower extremities (HCC)   Chronic kidney disease, stage 3b (HCC)   CVA due to embolism of cerebral artery -Significant labs and imaging -MRI: acute infarct in the bilateral frontal, parietal, and occipital lobes, R basal ganglia, L thalamus, and L cerebellum -Hemoglobin A1c: 8.9 on 08/26/24 -Carotid US  on 08/20/24:  40-59% stenosis in L ICA, 1-39% stenosis in R ICA -Transcranial doppler suggests diffuse intracranial atherosclerosis -Lipid panel: LDL of 119, HDL of 36 -PT/OT have assessed the pt -Pt was evaluated by neurology, stroke team signed off -Atrial fibrillation not appreciated on ECG, will need 30 day heart monitor outpatient -Continue daily aspirin  81 mg PO, clopidogrel  75 mg PO daily -Continue SQ heparin  for VTE  prophylaxis -Continue seroquel  PRN at bedtime for delirium  -Smoking cessation with patient education   Acute on chronic diastolic heart failure -Significant labs and imaging -Chest radiograph: cardiomegaly, small bilateral pleural effusions, possible pulmonary edema -Kidney function: BUN 58, Cr 1.92, eGFR 30 -Echocardiogram: LVEF of 50-55%, severe L ventricle dilation and and hypertrophy -Cardiac profile: proBNP 35,000 -Continue Furosemide  120 mg IV q6hrs -Continue coreg  3.125 mg PO bid  -Supplement K PRN -Strict I/Os and daily weights -Trend kidney function  -In last 24 hours, urine output of 350 ml and weight loss of 1.8 lbs.  Stage 3b chronic kidney disease with acute kidney injury  -Hypervolemic hyponatremia: Na 127 -Kidney function: BUN 58, Cr 1.92, eGFR 30, K 4.8, bicarb 21 -Continue pt on Furosemide  120 mg IV q6hrs -Strict I/Os and daily weights  Type 2 DM -continue sliding scale insulin  -continue basal insulin   Coronary artery disease Hyperlipidemia -continue atorvastatin  and zetia   Essential hypertension -monitor BP -continue coreg  and hydralazine   PAD s/p bilateral AKA -continue working with PT/OT -limited movement resulted in stage 3 pressure ulcer of the sacral region -follow wound care with Santyl   Anemia of chronic disease -trend H&H  COPD -continue albuterol  nebulizer PRN  Depression and anxiety -continue xanax  -restart lexapro and cymbalta   DVT prophylaxis: heparin  5000 units q8hrs Code Status: DNR/DNI Family Communication: No family at bedside Disposition:   Status is: Inpatient Remains inpatient appropriate because: IV lasix    Consultants:  Wound care Nephrology Palliative care Cardiology Neurology    Antimicrobials:  Unasyn  3g IV started 08/25/24 ended 08/27/24 Cefepime  2g IV started 08/27/24 ended 08/30/24 Vancomycin  IV started  08/26/24 ended 08/29/24   Subjective: Pt responded with yes when asking if she was in pain, but was  unable to articulate the location of the pain.  Objective: Vitals:   08/30/24 2304 08/31/24 0325 08/31/24 0615 08/31/24 0813  BP: (!) 137/54 134/62  (!) 160/84  Pulse:  66  72  Resp:  15  17  Temp: 97.7 F (36.5 C) 97.7 F (36.5 C)  97.7 F (36.5 C)  TempSrc: Oral Axillary  Oral  SpO2:    96%  Weight:   73 kg   Height:        Intake/Output Summary (Last 24 hours) at 08/31/2024 0951 Last data filed at 08/30/2024 1152 Gross per 24 hour  Intake 480 ml  Output --  Net 480 ml   Filed Weights   08/29/24 0358 08/30/24 0400 08/31/24 0615  Weight: 70.4 kg 74.8 kg 73 kg    Examination:  General exam: somnolent, appears uncomfortable HEENT: no scleral icterus Respiratory system: Clear to auscultation. Respiratory effort normal. Moving air well.  Cardiovascular system: S1 & S2 heard, RRR. No JVD, murmurs or rubs.  Gastrointestinal system: Abdomen is nondistended, soft and nontender to palpation.  Central nervous system: Patient is oriented to person only.  Skin: No rashes, lesions or ulcers Psychiatry: Patient seems uncomfortable and anxious, difficulty expressing concerns    Data Reviewed: I have personally reviewed following labs and imaging studies  CBC: Recent Labs  Lab 08/25/24 0409 08/26/24 0418 08/28/24 1042 08/30/24 0714 08/31/24 0454  WBC 14.2* 16.6* 16.5* 12.4* 11.0*  HGB 10.2* 9.2* 8.1* 7.8* 8.3*  HCT 31.0* 27.9* 25.0* 23.6* 25.2*  MCV 91.7 91.8 92.3 92.2 92.6  PLT 177 194 220 223 233    Basic Metabolic Panel: Recent Labs  Lab 08/26/24 0418 08/27/24 0237 08/28/24 0238 08/29/24 0855 08/30/24 0714 08/31/24 0454  NA 123* 125* 125* 126* 127* 127*  K 4.9 5.0 4.8 4.8 4.1 4.8  CL 90* 91* 90* 92* 92* 91*  CO2 23 20* 20* 19* 22 21*  GLUCOSE 221* 157* 174* 92 125* 323*  BUN 41* 42* 48* 51* 53* 58*  CREATININE 1.36* 1.60* 1.73* 1.88* 1.80* 1.92*  CALCIUM  7.6* 8.1* 8.4* 8.6* 8.6* 8.7*  MG 2.1  --  2.3  --   --   --   PHOS  --   --   --   --   --  4.1     GFR: Estimated Creatinine Clearance: 33.4 mL/min (A) (by C-G formula based on SCr of 1.92 mg/dL (H)).  Liver Function Tests: Recent Labs  Lab 08/26/24 0941 08/31/24 0454  AST 20  --   ALT 18  --   ALKPHOS 106  --   BILITOT 0.5  --   PROT 5.4*  --   ALBUMIN  3.0* 3.7    CBG: Recent Labs  Lab 08/30/24 0755 08/30/24 1151 08/30/24 1541 08/30/24 2145 08/31/24 0804  GLUCAP 129* 220* 263* 306* 316*     No results found for this or any previous visit (from the past 240 hours).       Radiology Studies: No results found.      Scheduled Meds:  acetaminophen   1,000 mg Oral TID   aspirin  EC  81 mg Oral Daily   carvedilol   3.125 mg Oral BID WC   clopidogrel   75 mg Oral Daily   collagenase    Topical Daily   ezetimibe   10 mg Oral Daily   heparin   5,000 Units Subcutaneous Q8H   insulin   aspart  0-5 Units Subcutaneous QHS   insulin  aspart  0-9 Units Subcutaneous TID WC   insulin  aspart  3 Units Subcutaneous TID WC   insulin  glargine  10 Units Subcutaneous Daily   lactose free nutrition  237 mL Oral TID WC   multivitamin with minerals  1 tablet Oral Daily   nicotine   21 mg Transdermal Daily   polyethylene glycol  17 g Oral Daily   rosuvastatin   40 mg Oral QPM   senna-docusate  1 tablet Oral BID   Continuous Infusions:  furosemide  120 mg (08/31/24 0750)     LOS: 12 days    Kipton Skillen, PA-S [facility]    To contact the attending provider between 7A-7P or the covering provider during after hours 7P-7A, please log into the web site [email or web address] and access using universal [place] password for that web site. If you do not have the password, please call the hospital operator.  08/31/2024, 9:51 AM   "

## 2024-08-31 NOTE — Progress Notes (Signed)
 Occupational Therapy Treatment Patient Details Name: [name] MRN: [id number] DOB: 01-06-1971 Today's Date: 08/31/2024   History of present illness 54 y.o. female admitted to [facility] 08/15/24 with AMS, acute decompensated HF, L-side weakness. Pt less responsive 1/21; head CT with R caudate infarct; briefly made comfort care but then status improved. Transfer to [facility] 08/19/24 with progressive renal failure. MRI 1/24 showed multiple acute infarcts in bilateral frontal, parietal and occipital lobes, right basal ganglia, left thalamus and left cerebellum, possibly embolic. PMH includes HFrEF (30-35%), PVCs, tobacco abuse, HTN, PAD s/p bilateral AKA, CKD III, HLD, PVD, PE, asthma.   OT comments  Pt presented in bed on her R side and had a bm/urination but was unaware. Pt required total assist for peri care with mod-max assist with rolling due to pain. She also noted to have blood on her hands as was itching at nose and encouraged to use cloth in session. She attempted to complete some self feeding but needed cues to remain at midline/sitting position as then would roll to the side and required min-mod assist. Patient will benefit from continued inpatient follow up therapy, <3 hours/day.      If plan is discharge home, recommend the following:  Two people to help with bathing/dressing/bathroom;Two people to help with walking and/or transfers;Assistance with cooking/housework;Assist for transportation   Equipment Recommendations  Other (comment) (tbd at next venue)    Recommendations for Other Services      Precautions / Restrictions Precautions Precautions: Fall;Other (comment) Recall of Precautions/Restrictions: Impaired Precaution/Restrictions Comments: h/o bilat AKAs; urinary incontinence/bowel Restrictions Weight Bearing Restrictions Per Provider Order: No       Mobility Bed Mobility Overal bed mobility: Needs Assistance Bed Mobility: Rolling Rolling: Max assist, Used rails,  Mod assist         General bed mobility comments: depeding on pain how much assist as likes to be on R side to avoide pressure wound    Transfers                         Balance                                           ADL either performed or assessed with clinical judgement   ADL Overall ADL's : Needs assistance/impaired Eating/Feeding: Minimal assistance;Sitting Eating/Feeding Details (indicate cue type and reason): Pt needed cup placed in hand and then as focused on pain needed cue to start drinking once up to lips Grooming: Wash/dry face;Minimal assistance Grooming Details (indicate cue type and reason): blowing nose/wipind due to blood Upper Body Bathing: Moderate assistance;Bed level   Lower Body Bathing: Total assistance;Bed level   Upper Body Dressing : Moderate assistance;Bed level   Lower Body Dressing: Total assistance;Bed level       Toileting- Clothing Manipulation and Hygiene: Total assistance;Bed level              Extremity/Trunk Assessment Upper Extremity Assessment Upper Extremity Assessment: Generalized weakness;Difficult to assess due to impaired cognition            Vision       Perception     Praxis     Communication Communication Communication: Impaired Factors Affecting Communication: Difficulty expressing self   Cognition Arousal: Alert Behavior During Therapy: Flat affect Cognition: History of cognitive impairments, Cognition impaired, Difficult to assess Difficult to assess  due to: Impaired communication Orientation impairments: Place, Time, Situation Awareness: Intellectual awareness impaired, Online awareness impaired Memory impairment (select all impairments): Short-term memory, Working memory Attention impairment (select first level of impairment): Sustained attention Executive functioning impairment (select all impairments): Organization, Reasoning, Problem solving, Initiation,  Sequencing OT - Cognition Comments: Pt noted to only atttend to one task at a time                 Following commands: Impaired Following commands impaired: Follows one step commands inconsistently, Follows one step commands with increased time      Cueing   Cueing Techniques: Gestural cues, Verbal cues, Tactile cues  Exercises      Shoulder Instructions       General Comments      Pertinent Vitals/ Pain       Pain Assessment Pain Assessment: Faces Faces Pain Scale: Hurts whole lot Pain Location: bottom Pain Descriptors / Indicators: Discomfort, Grimacing Pain Intervention(s): Limited activity within patient's tolerance, Monitored during session, Repositioned  Home Living                                          Prior Functioning/Environment              Frequency  Min 2X/week        Progress Toward Goals  OT Goals(current goals can now be found in the care plan section)  Progress towards OT goals: Progressing toward goals  Acute Rehab OT Goals Patient Stated Goal: to get comfortable OT Goal Formulation: With patient Time For Goal Achievement: 09/14/24 Potential to Achieve Goals: Poor  Plan      Co-evaluation                 AM-PAC OT 6 Clicks Daily Activity     Outcome Measure   Help from another person eating meals?: A Little Help from another person taking care of personal grooming?: A Lot Help from another person toileting, which includes using toliet, bedpan, or urinal?: Total Help from another person bathing (including washing, rinsing, drying)?: Total Help from another person to put on and taking off regular upper body clothing?: A Lot Help from another person to put on and taking off regular lower body clothing?: Total 6 Click Score: 10    End of Session    OT Visit Diagnosis: Unsteadiness on feet (R26.81);Other abnormalities of gait and mobility (R26.89);Muscle weakness (generalized) (M62.81);Cognitive  communication deficit (R41.841);Other symptoms and signs involving cognitive function Symptoms and signs involving cognitive functions: Cerebral infarction   Activity Tolerance Patient limited by pain   Patient Left in bed;with call bell/phone within reach   Nurse Communication Mobility status (pain. nose)        Time: 9249-9178 OT Time Calculation (min): 31 min  Charges: OT General Charges $OT Visit: 1 Visit OT Treatments $Self Care/Home Management : 23-37 mins  Warrick POUR OTR/L  Acute Rehab Services  [phone] office number   Warrick Berber 08/31/2024, 8:29 AM

## 2024-08-31 NOTE — Consult Note (Addendum)
" ° ° °  CLINICAL SUPPORT TEAM - WOUND OSTOMY AND CONTINENCE TEAM  CONSULTATION SERVICES   WOC Nurse-Inpatient Note   WOC Nurse wound follow up: Consult performed remotely after review of progress notes and photos in the EMR.   Refer to [facility] consult note on 1/31.  Pt had a Deep tissue pressure injury which was evolving into Unstageable to the sacrum, this was present on admission.   Wound is approx 90% tightly adhered yellow slough, 10% pink at the wound edges, 6X5cm, some areas of dark red-purple Deep tissue pressure injury surrounding the edges. Santyl  has been ordered to provide enzymatic debridement of nonviable tissue.    Continue present plan of care. Topical treatment orders have been provided for bedside nurses to perform.  Pt will transfer to a SNF soon, according to progress notes.   Please re-consult if further assistance is needed.  Thank-you,  Stephane Fought MSN, RN, CWOCN, CWCN-AP, CNS Contact Mon-Fri 0700-1500: [phone]  "

## 2024-08-31 NOTE — TOC Progression Note (Signed)
 Transition of Care [facility]) - Progression Note    Patient Details  Name: [name] MRN: [id number] Date of Birth: Jan 08, 1971  Transition of Care [facility]) CM/SW Contact  Isaiah Public, LCSWA Phone Number: 08/31/2024, 1:40 PM  Clinical Narrative:     MD informed CSW patient not medically ready today. CSW updated facility. Patient has SNF bed at [facility] . Insurance authorization approved for SNF. CSW will continue to follow.  Expected Discharge Plan: Skilled Nursing Facility Barriers to Discharge: Insurance Authorization               Expected Discharge Plan and Services   Discharge Planning Services: CM Consult Post Acute Care Choice: IP Rehab Living arrangements for the past 2 months: Single Family Home                   DME Agency: NA                   Social Drivers of Health (SDOH) Interventions SDOH Screenings   Food Insecurity: No Food Insecurity (08/22/2024)  Housing: Low Risk (08/22/2024)  Transportation Needs: No Transportation Needs (08/22/2024)  Utilities: Not At Risk (08/22/2024)  Alcohol  Screen: Low Risk (03/18/2022)  Depression (PHQ2-9): Medium Risk (03/18/2022)  Financial Resource Strain: Low Risk (08/16/2024)   Received from [facility]  Physical Activity: Inactive (08/16/2024)   Received from [facility]  Social Connections: Socially Isolated (08/16/2024)   Received from [facility]  Stress: No Stress Concern Present (08/16/2024)   Received from [facility]  Tobacco Use: High Risk (08/22/2024)  Health Literacy: Medium Risk (08/16/2024)   Received from [facility]    Readmission Risk Interventions     No data to display

## 2024-08-31 NOTE — Progress Notes (Signed)
 Pt removed purewick this morning and refused to put back on. Will not be able to measure her output. Dr. Noralee was made aware.  Loyce Blazing, RN

## 2024-09-01 LAB — CBC
HCT: 23.9 % — ABNORMAL LOW (ref 36.0–46.0)
Hemoglobin: 7.9 g/dL — ABNORMAL LOW (ref 12.0–15.0)
MCH: 30.7 pg (ref 26.0–34.0)
MCHC: 33.1 g/dL (ref 30.0–36.0)
MCV: 93 fL (ref 80.0–100.0)
Platelets: 238 10*3/uL (ref 150–400)
RBC: 2.57 MIL/uL — ABNORMAL LOW (ref 3.87–5.11)
RDW: 16.9 % — ABNORMAL HIGH (ref 11.5–15.5)
WBC: 10.2 10*3/uL (ref 4.0–10.5)
nRBC: 0 % (ref 0.0–0.2)

## 2024-09-01 LAB — RENAL FUNCTION PANEL
Albumin: 3.6 g/dL (ref 3.5–5.0)
Anion gap: 13 (ref 5–15)
BUN: 58 mg/dL — ABNORMAL HIGH (ref 6–20)
CO2: 23 mmol/L (ref 22–32)
Calcium: 8.6 mg/dL — ABNORMAL LOW (ref 8.9–10.3)
Chloride: 92 mmol/L — ABNORMAL LOW (ref 98–111)
Creatinine, Ser: 1.86 mg/dL — ABNORMAL HIGH (ref 0.44–1.00)
GFR, Estimated: 31 mL/min — ABNORMAL LOW
Glucose, Bld: 123 mg/dL — ABNORMAL HIGH (ref 70–99)
Phosphorus: 3.7 mg/dL (ref 2.5–4.6)
Potassium: 4.3 mmol/L (ref 3.5–5.1)
Sodium: 128 mmol/L — ABNORMAL LOW (ref 135–145)

## 2024-09-01 LAB — GLUCOSE, CAPILLARY
Glucose-Capillary: 141 mg/dL — ABNORMAL HIGH (ref 70–99)
Glucose-Capillary: 178 mg/dL — ABNORMAL HIGH (ref 70–99)
Glucose-Capillary: 228 mg/dL — ABNORMAL HIGH (ref 70–99)
Glucose-Capillary: 282 mg/dL — ABNORMAL HIGH (ref 70–99)

## 2024-09-01 MED ORDER — TORSEMIDE 100 MG PO TABS
100.0000 mg | ORAL_TABLET | Freq: Two times a day (BID) | ORAL | Status: AC
  Administered 2024-09-01 (×2): 100 mg via ORAL
  Filled 2024-09-01 (×3): qty 1

## 2024-09-01 NOTE — Plan of Care (Signed)

## 2024-09-01 NOTE — Progress Notes (Signed)
 Physical Therapy Treatment Patient Details Name: [name] MRN: [id number] DOB: 01/06/1971 Today's Date: 09/01/2024   History of Present Illness 54 y.o. female admitted to [facility] 08/15/24 with AMS, acute decompensated HF, L-side weakness. Pt less responsive 1/21; head CT with R caudate infarct; briefly made comfort care but then status improved. Transfer to [facility] 08/19/24 with progressive renal failure. MRI 1/24 showed multiple acute infarcts in bilateral frontal, parietal and occipital lobes, right basal ganglia, left thalamus and left cerebellum, possibly embolic. PMH includes HFrEF (30-35%), PVCs, tobacco abuse, HTN, PAD s/p bilateral AKA, CKD III, HLD, PVD, PE, asthma.    PT Comments  Upon arrival, pt was asleep and leaning against the right rail with her face pressed into the rail. Pt was lethargic during the session and would intermittently stare off in the distance or not respond to questions. Pt was aware of having a BM but declined calling out for help for pericare. Required MinA to roll bilaterally for pericare and bed pad change with use of bed rail. Then required TotalAx2 for supine<>sit transition with limited initiation. Pt had a heavy posterior and R lateral lean requiring MaxA to TotalA for support. Able to engage in ADL activities for ~5-8 minutes before reporting that she felt wet. Returned to supine for more pericare due to urinary incontinence. Continue to recommend <3hrs post acute rehab with acute PT to follow.     If plan is discharge home, recommend the following: Two people to help with walking and/or transfers;A lot of help with bathing/dressing/bathroom;Assistance with cooking/housework;Assist for transportation;Direct supervision/assist for medications management;Direct supervision/assist for financial management;Help with stairs or ramp for entrance;Supervision due to cognitive status   Can travel by private vehicle     No  Equipment Recommendations  Hoyer  lift;Hospital bed (with amputee sling)       Precautions / Restrictions Precautions Precautions: Fall;Other (comment) Recall of Precautions/Restrictions: Impaired Precaution/Restrictions Comments: h/o bilat AKAs; urinary incontinence/bowel Restrictions Weight Bearing Restrictions Per Provider Order: No     Mobility  Bed Mobility Overal bed mobility: Needs Assistance Bed Mobility: Rolling, Sidelying to Sit, Sit to Supine Rolling: Min assist, Used rails Sidelying to sit: Total assist, +2 for physical assistance, +2 for safety/equipment   Sit to supine: Total assist, +2 for physical assistance, +2 for safety/equipment, HOB elevated   General bed mobility comments: MinA to roll bilaterally for pericare and bed pad change. TotalAx2 for remainder of bed mobility with poor initiation    Transfers  General transfer comment: deferred 2/2 heavy posterior and R lateral lean    Modified Rankin (Stroke Patients Only) Modified Rankin (Stroke Patients Only) Pre-Morbid Rankin Score: Slight disability Modified Rankin: Severe disability     Balance Overall balance assessment: Needs assistance Sitting-balance support: Bilateral upper extremity supported Sitting balance-Leahy Scale: Zero Sitting balance - Comments: MaxA to TotalA due to posterior and R lateral lean Postural control: Right lateral lean, Posterior lean       Communication Communication Communication: Impaired Factors Affecting Communication: Difficulty expressing self  Cognition Arousal: Lethargic Behavior During Therapy: Flat affect   PT - Cognitive impairments: No family/caregiver present to determine baseline, Attention, Sequencing, Problem solving, Safety/Judgement, Awareness, Initiation, Orientation    PT - Cognition Comments: Required frequent cues to attend to task at hand with pt staring off in the distance or not responding to questions Following commands: Impaired Following commands impaired: Follows one step  commands inconsistently, Follows one step commands with increased time    Cueing Cueing Techniques: Gestural cues,  Verbal cues, Tactile cues         Pertinent Vitals/Pain Pain Assessment Pain Assessment: Faces Faces Pain Scale: Hurts even more Pain Location: bottom Pain Descriptors / Indicators: Discomfort, Grimacing Pain Intervention(s): Limited activity within patient's tolerance, Monitored during session, Repositioned     PT Goals (current goals can now be found in the care plan section) Acute Rehab PT Goals PT Goal Formulation: Patient unable to participate in goal setting Time For Goal Achievement: 09/15/24 Potential to Achieve Goals: Fair Progress towards PT goals: Progressing toward goals    Frequency    Min 2X/week       AM-PAC PT 6 Clicks Mobility   Outcome Measure  Help needed turning from your back to your side while in a flat bed without using bedrails?: A Little Help needed moving from lying on your back to sitting on the side of a flat bed without using bedrails?: Total Help needed moving to and from a bed to a chair (including a wheelchair)?: Total Help needed standing up from a chair using your arms (e.g., wheelchair or bedside chair)?: Total Help needed to walk in hospital room?: Total Help needed climbing 3-5 steps with a railing? : Total 6 Click Score: 8    End of Session   Activity Tolerance: Other (comment) (limited 2/2 cognition) Patient left: in bed;with call bell/phone within reach;with bed alarm set Nurse Communication: Mobility status PT Visit Diagnosis: Unsteadiness on feet (R26.81);Other abnormalities of gait and mobility (R26.89);Muscle weakness (generalized) (M62.81);Hemiplegia and hemiparesis Hemiplegia - Right/Left: Left Hemiplegia - caused by: Cerebral infarction     Time: 8964-8896 PT Time Calculation (min) (ACUTE ONLY): 28 min  Charges:    $Therapeutic Activity: 23-37 mins PT General Charges $$ ACUTE PT VISIT: 1  Visit                    Kate ORN, PT, DPT Secure Chat Preferred  Rehab Office [phone]   Kate BRAVO Wendolyn 09/01/2024, 12:43 PM

## 2024-09-01 NOTE — Progress Notes (Signed)
 Nephrology Follow-Up Consult note   Assessment/Recommendations: [name] is a/an 54 y.o. female with a past medical history significant for HFmrEF (50-55%), essential HTN, PAD s/p B/L AKA, CKD stage IIIb with progressive renal failure with decreasing urine output and increasing creatinine.  Admitted for acute CVA and suddenly became hyponatremic.  Likely hypervolemic hyponatremia secondary to history of HFmrEF.  Corrected sodium at 128 today, slight decrease from 132 yesterday.  Creatinine trended down from 1.92 > 1.86 s/p diuresis with another 3 doses of IV Lasix  120 mg. Unsure how reliable patient's weights and urine output are at this time. No new weight recorded this morning.   Hypervolemic Hyponatremia Progressive Oliguria AKI  CKD Stage IIIb Patient is euvolemic on exam. No new weight today, up 29 pounds since admission, unsure if this is accurate.  Urine studies demonstrated urine sodium 72 and urine osmolality 291, pointing towards hypervolemic hyponatremia .  Creatinine 1.92 (baseline 1.7-2.1), GFR 30 (baseline 26-35), Bicarb 21.  Hyponatremia improving with diuresis.  - Transition to PO Torsemide  100 mg BID  - Chart reviewed: (medications acceptable, does not appear to have been exposed to nephrotoxins with imaging or had episodes of significant hypotension).   - Continue to monitor daily Cr, Dose meds for GFR - Monitor Daily I/Os, Daily weight  - Maintain MAP>65 for optimal renal perfusion.  - Avoid nephrotoxic medications including NSAIDs - Use synthetic opioids (Fentanyl /Dilaudid ) if needed   HFmrEF EF improved to 50-55% per Echo from 07/2023 to 07/2024.  Weight is up 29 pounds since admission.  Cardiology following, appreciate recommendations.  Holding home Entresto , Alcactone, and Jardiance  d/t AKI from initial over-diuresis.   - Torsemide  100 mg BID - Continue Coreg  3.125 mg BID   Anemia of chronic disease - Transfuse for Hgb < 8 g/dL w/ history of heart failure - No  role for ESA in this setting   Uncontrolled Diabetes Mellitus Type 2 A1c 8.9. Currently not hyperglycemic.  Insulin  per primary team.   Acute CVA  AMS Acute bilateral infarcts in the frontal parietal lobe, basal ganglia, and left thalamus and cerebral suspected to be embolic.  Neurology on board treating with aspirin , Plavix , and statin.   Leukocytosis Improved.  Recommendations conveyed to primary service.    [name] [facility] 09/01/2024 12:22 PM  ___________________________________________________________  CC: progressive renal function decline  Interval History/Subjective: - Patient resting comfortably in bed in NAD - No concerns expressed this AM   Medications:  Current Facility-Administered Medications  Medication Dose Route Frequency Provider Last Rate Last Admin   acetaminophen  (TYLENOL ) tablet 1,000 mg  1,000 mg Oral TID Ferolito, Michelle Y, NP   1,000 mg at 09/01/24 0950   albuterol  (PROVENTIL ) (2.5 MG/3ML) 0.083% nebulizer solution 2.5 mg  2.5 mg Nebulization Q4H PRN Pahwani, Rinka R, MD       aspirin  EC tablet 81 mg  81 mg Oral Daily Paytes, Emma U, RPH   81 mg at 09/01/24 0950   carvedilol  (COREG ) tablet 3.125 mg  3.125 mg Oral BID WC Garrick Leontine SAILOR, PA-C   3.125 mg at 09/01/24 9145   citalopram  (CELEXA ) tablet 20 mg  20 mg Oral Daily Arrien, Elidia Sieving, MD   20 mg at 09/01/24 0950   clopidogrel  (PLAVIX ) tablet 75 mg  75 mg Oral Daily Adhikari, Amrit, MD   75 mg at 09/01/24 9049   collagenase  (SANTYL ) ointment   Topical Daily Odell Celinda Balo, MD   Given at 09/01/24 0950   DULoxetine  (CYMBALTA )  DR capsule 60 mg  60 mg Oral Daily Arrien, Mauricio Daniel, MD   60 mg at 09/01/24 0950   ezetimibe  (ZETIA ) tablet 10 mg  10 mg Oral Daily Garrick Leontine SAILOR, PA-C   10 mg at 09/01/24 0950   heparin  injection 5,000 Units  5,000 Units Subcutaneous Q8H Pahwani, Rinka R, MD   5,000 Units at 09/01/24 0544   insulin  aspart (novoLOG ) injection  0-5 Units  0-5 Units Subcutaneous QHS Odell Celinda Balo, MD   2 Units at 08/31/24 2213   insulin  aspart (novoLOG ) injection 0-9 Units  0-9 Units Subcutaneous TID WC Odell Celinda Balo, MD   1 Units at 09/01/24 9144   insulin  aspart (novoLOG ) injection 5 Units  5 Units Subcutaneous TID WC Arrien, Mauricio Daniel, MD   5 Units at 08/31/24 1236   insulin  glargine (LANTUS ) injection 15 Units  15 Units Subcutaneous Daily Arrien, Mauricio Daniel, MD   15 Units at 09/01/24 0949   lactose free nutrition (BOOST PLUS) liquid 237 mL  237 mL Oral TID WC Jillian Buttery, MD   237 mL at 09/01/24 0855   multivitamin with minerals tablet 1 tablet  1 tablet Oral Daily Jillian Buttery, MD   1 tablet at 09/01/24 0950   nicotine  (NICODERM CQ  - dosed in mg/24 hours) patch 21 mg  21 mg Transdermal Daily Jillian Buttery, MD   21 mg at 09/01/24 9050   ondansetron  (ZOFRAN ) tablet 4 mg  4 mg Oral Q6H PRN Pahwani, Rinka R, MD       Or   ondansetron  (ZOFRAN ) injection 4 mg  4 mg Intravenous Q6H PRN Pahwani, Rinka R, MD   4 mg at 08/31/24 2213   oxyCODONE  (Oxy IR/ROXICODONE ) immediate release tablet 2.5 mg  2.5 mg Oral Q6H PRN Ferolito, Michelle Y, NP       oxyCODONE  (Oxy IR/ROXICODONE ) immediate release tablet 5 mg  5 mg Oral Q4H PRN Ferolito, Michelle Y, NP   5 mg at 08/31/24 2212   rosuvastatin  (CRESTOR ) tablet 40 mg  40 mg Oral QPM Jillian Buttery, MD   40 mg at 08/30/24 1631   sodium chloride  (OCEAN) 0.65 % nasal spray 1 spray  1 spray Each Nare PRN Arrien, Elidia Sieving, MD       sodium chloride  flush (NS) 0.9 % injection 10-40 mL  10-40 mL Intracatheter PRN Odell Celinda Balo, MD       torsemide  (DEMADEX ) tablet 100 mg  100 mg Oral BID Peeples, Samuel J, MD   100 mg at 09/01/24 9145      Review of Systems: 10 systems reviewed and negative except per interval history/subjective  Physical Exam: Vitals:   09/01/24 0354 09/01/24 0836  BP: (!) 142/70 (!) 138/54  Pulse:  68  Resp: 16 16  Temp: 97.7 F (36.5  C) (!) 96.3 F (35.7 C)  SpO2:  98%   Total I/O In: -  Out: 200 [Urine:200]  Intake/Output Summary (Last 24 hours) at 09/01/2024 1222 Last data filed at 09/01/2024 9162 Gross per 24 hour  Intake 711 ml  Output 575 ml  Net 136 ml    Constitutional: No acute distress ENMT: ears and nose without scars or lesions, MMM CV: normal rate, no edema Respiratory: clear to auscultation, normal work of breathing Gastrointestinal: soft, non-tender, no palpable masses or hernias Skin: sacral decubitus ulcer   Test Results I personally reviewed new and old clinical labs and radiology tests Lab Results  Component Value Date   NA 128 (L) 09/01/2024  K 4.3 09/01/2024   CL 92 (L) 09/01/2024   CO2 23 09/01/2024   BUN 58 (H) 09/01/2024   CREATININE 1.86 (H) 09/01/2024   CALCIUM  8.6 (L) 09/01/2024   ALBUMIN  3.6 09/01/2024   PHOS 3.7 09/01/2024    CBC Recent Labs  Lab 08/30/24 0714 08/31/24 0454 09/01/24 0450  WBC 12.4* 11.0* 10.2  HGB 7.8* 8.3* 7.9*  HCT 23.6* 25.2* 23.9*  MCV 92.2 92.6 93.0  PLT 223 233 238

## 2024-09-01 NOTE — Progress Notes (Incomplete)
 " PROGRESS NOTE    [name]  FMW:[id number] DOB: [birth year] DOA: 08/19/2024 PCP: Skillman, Katherine E, PA-C (Confirm with patient/family/NH records and if not entered, this HAS to be entered at [facility] point of entry. No PCP if truly none.)   No chief complaint on file.   Brief Narrative: (Start on day 1 of progress note - keep it brief and live) ***   Assessment & Plan:   Principal Problem:   Cerebrovascular accident (CVA) due to embolism of cerebral artery (HCC) Active Problems:   Chronic kidney disease, stage 3b (HCC)   Acute on chronic diastolic (congestive) heart failure (HCC)   Hypertension   Type 2 diabetes mellitus with hyperlipidemia (HCC)   Status post below-knee amputation of both lower extremities (HCC)   COPD (chronic obstructive pulmonary disease) (HCC)   ***   DVT prophylaxis: (Lovenox/Heparin /SCD's/anticoagulated/None (if comfort care) Code Status: (Full/Partial - specify details) Family Communication: (Specify name, relationship & date discussed. NO discussed with patient) Disposition:   Status is: Inpatient {Inpatient:23812}   Consultants:  ***  Procedures: (Don't include imaging studies which can be auto populated. Include things that cannot be auto populated i.e. Echo, Carotid and venous dopplers, Foley, Bipap, HD, tubes/drains, wound vac, central lines etc) ***  Antimicrobials: (specify start and planned stop date. Auto populated tables are space occupying and do not give end dates) ***    Subjective: Patient laying in bed alert.  Denies any chest pain or shortness of breath.  Patient with complaints of pain in her back and her sacral region.  Objective: Vitals:   08/31/24 2340 09/01/24 0354 09/01/24 0836 09/01/24 1225  BP: (!) 150/59 (!) 142/70 (!) 138/54 (!) 128/58  Pulse: 69  68 63  Resp: 17 16 16 15   Temp: 97.6 F (36.4 C) 97.7 F (36.5 C) (!) 96.3 F (35.7 C) 98.4 F (36.9 C)  TempSrc: Axillary Oral Oral Oral  SpO2: 95%   98% 97%  Weight:      Height:        Intake/Output Summary (Last 24 hours) at 09/01/2024 1459 Last data filed at 09/01/2024 0837 Gross per 24 hour  Intake 474 ml  Output 575 ml  Net -101 ml   Filed Weights   08/29/24 0358 08/30/24 0400 08/31/24 0615  Weight: 70.4 kg 74.8 kg 73 kg    Examination:  General exam: Appears calm and comfortable  Respiratory system: Clear to auscultation. Respiratory effort normal. Cardiovascular system: S1 & S2 heard, RRR. No JVD, murmurs, rubs, gallops or clicks. No pedal edema. Gastrointestinal system: Abdomen is nondistended, soft and nontender. No organomegaly or masses felt. Normal bowel sounds heard. Central nervous system: Alert and oriented. No focal neurological deficits. Extremities: Symmetric 5 x 5 power. Skin: No rashes, lesions or ulcers Psychiatry: Judgement and insight appear normal. Mood & affect appropriate.     Data Reviewed: I have personally reviewed following labs and imaging studies  CBC: Recent Labs  Lab 08/26/24 0418 08/28/24 1042 08/30/24 0714 08/31/24 0454 09/01/24 0450  WBC 16.6* 16.5* 12.4* 11.0* 10.2  HGB 9.2* 8.1* 7.8* 8.3* 7.9*  HCT 27.9* 25.0* 23.6* 25.2* 23.9*  MCV 91.8 92.3 92.2 92.6 93.0  PLT 194 220 223 233 238    Basic Metabolic Panel: Recent Labs  Lab 08/26/24 0418 08/27/24 0237 08/28/24 0238 08/29/24 0855 08/30/24 0714 08/31/24 0454 09/01/24 0450  NA 123*   < > 125* 126* 127* 127* 128*  K 4.9   < > 4.8 4.8 4.1 4.8 4.3  CL 90*   < > 90* 92* 92* 91* 92*  CO2 23   < > 20* 19* 22 21* 23  GLUCOSE 221*   < > 174* 92 125* 323* 123*  BUN 41*   < > 48* 51* 53* 58* 58*  CREATININE 1.36*   < > 1.73* 1.88* 1.80* 1.92* 1.86*  CALCIUM  7.6*   < > 8.4* 8.6* 8.6* 8.7* 8.6*  MG 2.1  --  2.3  --   --   --   --   PHOS  --   --   --   --   --  4.1 3.7   < > = values in this interval not displayed.    GFR: Estimated Creatinine Clearance: 34.5 mL/min (A) (by C-G formula based on SCr of 1.86 mg/dL  (H)).  Liver Function Tests: Recent Labs  Lab 08/26/24 0941 08/31/24 0454 09/01/24 0450  AST 20  --   --   ALT 18  --   --   ALKPHOS 106  --   --   BILITOT 0.5  --   --   PROT 5.4*  --   --   ALBUMIN  3.0* 3.7 3.6    CBG: Recent Labs  Lab 08/31/24 1141 08/31/24 1621 08/31/24 2158 09/01/24 0832 09/01/24 1224  GLUCAP 202* 82 245* 141* 178*     No results found for this or any previous visit (from the past 240 hours).       Radiology Studies: No results found.      Scheduled Meds:  acetaminophen   1,000 mg Oral TID   aspirin  EC  81 mg Oral Daily   carvedilol   3.125 mg Oral BID WC   citalopram   20 mg Oral Daily   clopidogrel   75 mg Oral Daily   collagenase    Topical Daily   DULoxetine   60 mg Oral Daily   ezetimibe   10 mg Oral Daily   heparin   5,000 Units Subcutaneous Q8H   insulin  aspart  0-5 Units Subcutaneous QHS   insulin  aspart  0-9 Units Subcutaneous TID WC   insulin  aspart  5 Units Subcutaneous TID WC   insulin  glargine  15 Units Subcutaneous Daily   lactose free nutrition  237 mL Oral TID WC   multivitamin with minerals  1 tablet Oral Daily   nicotine   21 mg Transdermal Daily   rosuvastatin   40 mg Oral QPM   torsemide   100 mg Oral BID   Continuous Infusions:   LOS: 13 days    Time spent: ***    Toribio Hummer, MD [facility]   To contact the attending provider between 7A-7P or the covering provider during after hours 7P-7A, please log into the web site [email or web address] and access using universal [place] password for that web site. If you do not have the password, please call the hospital operator.  09/01/2024, 2:59 PM    "

## 2024-09-01 NOTE — Plan of Care (Signed)
 " Problem: Education: Goal: Knowledge of General Education information will improve Description: Including pain rating scale, medication(s)/side effects and non-pharmacologic comfort measures 09/01/2024 1804 by Dallie Waddell SAUNDERS, RN Outcome: Progressing 09/01/2024 1615 by Dallie Waddell SAUNDERS, RN Outcome: Progressing   Problem: Health Behavior/Discharge Planning: Goal: Ability to manage health-related needs will improve 09/01/2024 1804 by Dallie Waddell SAUNDERS, RN Outcome: Progressing 09/01/2024 1615 by Dallie Waddell SAUNDERS, RN Outcome: Progressing   Problem: Clinical Measurements: Goal: Ability to maintain clinical measurements within normal limits will improve 09/01/2024 1804 by Dallie Waddell SAUNDERS, RN Outcome: Progressing 09/01/2024 1615 by Dallie Waddell SAUNDERS, RN Outcome: Progressing Goal: Will remain free from infection 09/01/2024 1804 by Dallie Waddell SAUNDERS, RN Outcome: Progressing 09/01/2024 1615 by Dallie Waddell SAUNDERS, RN Outcome: Progressing Goal: Diagnostic test results will improve 09/01/2024 1804 by Dallie Waddell SAUNDERS, RN Outcome: Progressing 09/01/2024 1615 by Dallie Waddell SAUNDERS, RN Outcome: Progressing Goal: Respiratory complications will improve 09/01/2024 1804 by Dallie Waddell SAUNDERS, RN Outcome: Progressing 09/01/2024 1615 by Dallie Waddell SAUNDERS, RN Outcome: Progressing Goal: Cardiovascular complication will be avoided 09/01/2024 1804 by Dallie Waddell SAUNDERS, RN Outcome: Progressing 09/01/2024 1615 by Dallie Waddell SAUNDERS, RN Outcome: Progressing   Problem: Activity: Goal: Risk for activity intolerance will decrease 09/01/2024 1804 by Dallie Waddell SAUNDERS, RN Outcome: Progressing 09/01/2024 1615 by Dallie Waddell SAUNDERS, RN Outcome: Progressing   Problem: Nutrition: Goal: Adequate nutrition will be maintained 09/01/2024 1804 by Dallie Waddell SAUNDERS, RN Outcome: Progressing 09/01/2024 1615 by Dallie Waddell SAUNDERS, RN Outcome: Progressing   Problem: Coping: Goal: Level of anxiety will decrease 09/01/2024 1804 by Dallie Waddell SAUNDERS, RN Outcome:  Progressing 09/01/2024 1615 by Dallie Waddell SAUNDERS, RN Outcome: Progressing   Problem: Elimination: Goal: Will not experience complications related to bowel motility 09/01/2024 1804 by Dallie Waddell SAUNDERS, RN Outcome: Progressing 09/01/2024 1615 by Dallie Waddell SAUNDERS, RN Outcome: Progressing Goal: Will not experience complications related to urinary retention 09/01/2024 1804 by Dallie Waddell SAUNDERS, RN Outcome: Progressing 09/01/2024 1615 by Dallie Waddell SAUNDERS, RN Outcome: Progressing   Problem: Pain Managment: Goal: General experience of comfort will improve and/or be controlled 09/01/2024 1804 by Dallie Waddell SAUNDERS, RN Outcome: Progressing 09/01/2024 1615 by Dallie Waddell SAUNDERS, RN Outcome: Progressing   Problem: Safety: Goal: Ability to remain free from injury will improve 09/01/2024 1804 by Dallie Waddell SAUNDERS, RN Outcome: Progressing 09/01/2024 1615 by Dallie Waddell SAUNDERS, RN Outcome: Progressing   Problem: Skin Integrity: Goal: Risk for impaired skin integrity will decrease 09/01/2024 1804 by Dallie Waddell SAUNDERS, RN Outcome: Progressing 09/01/2024 1615 by Dallie Waddell SAUNDERS, RN Outcome: Progressing   Problem: Education: Goal: Ability to describe self-care measures that may prevent or decrease complications (Diabetes Survival Skills Education) will improve 09/01/2024 1804 by Dallie Waddell SAUNDERS, RN Outcome: Progressing 09/01/2024 1615 by Dallie Waddell SAUNDERS, RN Outcome: Progressing Goal: Individualized Educational Video(s) 09/01/2024 1804 by Dallie Waddell SAUNDERS, RN Outcome: Progressing 09/01/2024 1615 by Dallie Waddell SAUNDERS, RN Outcome: Progressing   Problem: Coping: Goal: Ability to adjust to condition or change in health will improve 09/01/2024 1804 by Dallie Waddell SAUNDERS, RN Outcome: Progressing 09/01/2024 1615 by Dallie Waddell SAUNDERS, RN Outcome: Progressing   Problem: Fluid Volume: Goal: Ability to maintain a balanced intake and output will improve 09/01/2024 1804 by Dallie Waddell SAUNDERS, RN Outcome: Progressing 09/01/2024 1615 by Dallie Waddell SAUNDERS,  RN Outcome: Progressing   Problem: Health Behavior/Discharge Planning: Goal: Ability to identify and utilize available resources and services will improve 09/01/2024 1804 by Dallie Waddell SAUNDERS, RN Outcome: Progressing 09/01/2024 1615  by Dallie Waddell SAUNDERS, RN Outcome: Progressing Goal: Ability to manage health-related needs will improve 09/01/2024 1804 by Dallie Waddell SAUNDERS, RN Outcome: Progressing 09/01/2024 1615 by Dallie Waddell SAUNDERS, RN Outcome: Progressing   Problem: Metabolic: Goal: Ability to maintain appropriate glucose levels will improve 09/01/2024 1804 by Dallie Waddell SAUNDERS, RN Outcome: Progressing 09/01/2024 1615 by Dallie Waddell SAUNDERS, RN Outcome: Progressing   Problem: Nutritional: Goal: Maintenance of adequate nutrition will improve 09/01/2024 1804 by Dallie Waddell SAUNDERS, RN Outcome: Progressing 09/01/2024 1615 by Dallie Waddell SAUNDERS, RN Outcome: Progressing Goal: Progress toward achieving an optimal weight will improve 09/01/2024 1804 by Dallie Waddell SAUNDERS, RN Outcome: Progressing 09/01/2024 1615 by Dallie Waddell SAUNDERS, RN Outcome: Progressing   Problem: Skin Integrity: Goal: Risk for impaired skin integrity will decrease 09/01/2024 1804 by Dallie Waddell SAUNDERS, RN Outcome: Progressing 09/01/2024 1615 by Dallie Waddell SAUNDERS, RN Outcome: Progressing   Problem: Tissue Perfusion: Goal: Adequacy of tissue perfusion will improve 09/01/2024 1804 by Dallie Waddell SAUNDERS, RN Outcome: Progressing 09/01/2024 1615 by Dallie Waddell SAUNDERS, RN Outcome: Progressing   Problem: Education: Goal: Knowledge of disease or condition will improve 09/01/2024 1804 by Dallie Waddell SAUNDERS, RN Outcome: Progressing 09/01/2024 1615 by Dallie Waddell SAUNDERS, RN Outcome: Progressing Goal: Knowledge of secondary prevention will improve (MUST DOCUMENT ALL) 09/01/2024 1804 by Dallie Waddell SAUNDERS, RN Outcome: Progressing 09/01/2024 1615 by Dallie Waddell SAUNDERS, RN Outcome: Progressing Goal: Knowledge of patient specific risk factors will improve (DELETE if not current risk  factor) 09/01/2024 1804 by Dallie Waddell SAUNDERS, RN Outcome: Progressing 09/01/2024 1615 by Dallie Waddell SAUNDERS, RN Outcome: Progressing   Problem: Ischemic Stroke/TIA Tissue Perfusion: Goal: Complications of ischemic stroke/TIA will be minimized 09/01/2024 1804 by Dallie Waddell SAUNDERS, RN Outcome: Progressing 09/01/2024 1615 by Dallie Waddell SAUNDERS, RN Outcome: Progressing   Problem: Coping: Goal: Will verbalize positive feelings about self 09/01/2024 1804 by Dallie Waddell SAUNDERS, RN Outcome: Progressing 09/01/2024 1615 by Dallie Waddell SAUNDERS, RN Outcome: Progressing Goal: Will identify appropriate support needs 09/01/2024 1804 by Dallie Waddell SAUNDERS, RN Outcome: Progressing 09/01/2024 1615 by Dallie Waddell SAUNDERS, RN Outcome: Progressing   Problem: Health Behavior/Discharge Planning: Goal: Ability to manage health-related needs will improve 09/01/2024 1804 by Dallie Waddell SAUNDERS, RN Outcome: Progressing 09/01/2024 1615 by Dallie Waddell SAUNDERS, RN Outcome: Progressing Goal: Goals will be collaboratively established with patient/family 09/01/2024 1804 by Dallie Waddell SAUNDERS, RN Outcome: Progressing 09/01/2024 1615 by Dallie Waddell SAUNDERS, RN Outcome: Progressing   Problem: Self-Care: Goal: Ability to participate in self-care as condition permits will improve 09/01/2024 1804 by Dallie Waddell SAUNDERS, RN Outcome: Progressing 09/01/2024 1615 by Dallie Waddell SAUNDERS, RN Outcome: Progressing Goal: Verbalization of feelings and concerns over difficulty with self-care will improve 09/01/2024 1804 by Dallie Waddell SAUNDERS, RN Outcome: Progressing 09/01/2024 1615 by Dallie Waddell SAUNDERS, RN Outcome: Progressing Goal: Ability to communicate needs accurately will improve 09/01/2024 1804 by Dallie Waddell SAUNDERS, RN Outcome: Progressing 09/01/2024 1615 by Dallie Waddell SAUNDERS, RN Outcome: Progressing   Problem: Nutrition: Goal: Risk of aspiration will decrease 09/01/2024 1804 by Dallie Waddell SAUNDERS, RN Outcome: Progressing 09/01/2024 1615 by Dallie Waddell SAUNDERS, RN Outcome: Progressing Goal: Dietary  intake will improve 09/01/2024 1804 by Dallie Waddell SAUNDERS, RN Outcome: Progressing 09/01/2024 1615 by Dallie Waddell SAUNDERS, RN Outcome: Progressing   "

## 2024-09-01 NOTE — TOC Progression Note (Signed)
 Transition of Care [facility]) - Progression Note    Patient Details  Name: [name] MRN: [id number] Date of Birth: [birth year]  Transition [facility] CM/SW Contact  Isaiah Public, LCSWA Phone Number: 09/01/2024, 3:06 PM  Clinical Narrative:     Nyle with [facility] informed CSW patients insurance authorization approved through today for SNF. CSW informed MD. MD informed CSW patient not medically ready today. CSW request for facility to restart auth. Jon with [facility] confirmed she will inform Nyle with [facility] to restart insurance authorization for patient for SNF. CSW will continue to follow.  Expected Discharge Plan: Skilled Nursing Facility Barriers to Discharge: Insurance Authorization               Expected Discharge Plan and Services   Discharge Planning Services: CM Consult Post Acute Care Choice: IP Rehab Living arrangements for the past 2 months: Single Family Home                   DME Agency: NA                   Social Drivers of Health (SDOH) Interventions SDOH Screenings   Food Insecurity: No Food Insecurity (08/22/2024)  Housing: Low Risk (08/22/2024)  Transportation Needs: No Transportation Needs (08/22/2024)  Utilities: Not At Risk (08/22/2024)  Alcohol  Screen: Low Risk (03/18/2022)  Depression (PHQ2-9): Medium Risk (03/18/2022)  Financial Resource Strain: Low Risk (08/16/2024)   Received from [facility]  Physical Activity: Inactive (08/16/2024)   Received from [facility]  Social Connections: Socially Isolated (08/16/2024)   Received from [facility]  Stress: No Stress Concern Present (08/16/2024)   Received from [facility]  Tobacco Use: High Risk (08/22/2024)  Health Literacy: Medium Risk (08/16/2024)   Received from [facility]    Readmission Risk Interventions     No data to display

## 2024-09-01 NOTE — Progress Notes (Signed)
 Speech Language Pathology Treatment: Cognitive-Linguistic  Patient Details Name: [name] MRN: [id number] DOB: 1970-09-04 Today's Date: 09/01/2024 Time: 8854-8843 SLP Time Calculation (min) (ACUTE ONLY): 11 min  Assessment / Plan / Recommendation Clinical Impression  Pt poorly participatory today.  Needed constant cues to help her attend, open her eyes and answer questions.  She was rolling from side to side in bed, moaning.  Able to finally identify her backside as source of discomfort.  Less perseverative today, able to vary her word production and answer questions about comfort/needs with yes/no responses, required max cues to elicit responses.  Inattention, lethargy, pain are obstacles to participation at this time.   HPI HPI: 54 y.o. female presents to [facility] 08/19/24 from [facility] for evaluation of progressive renal failure. Pt also with AMS w/ L sided weakness and acute on chronic CHF exacerbation. MRI brain showed multiple acute infarcts in bilateral frontal, parietal and occipital lobes, right basal ganglia, left thalamus and left cerebellum, possibly embolic. PMHx: HFrEF (30-35%), PVCs, tobacco abuse, HTN, PAD, bilateral AKA, CKD III, HLD, PVD      SLP Plan  Continue with current plan of care                     Frequent or constant Supervision/Assistance Cognitive communication deficit (R41.841)     Continue with current plan of care    Sharon Leija L. Vona, MA CCC/SLP Clinical Specialist - Acute Care SLP Acute Rehabilitation Services Office number [phone]  [name]  09/01/2024, 12:07 PM

## 2024-09-01 NOTE — Progress Notes (Addendum)
 "  Palliative Medicine Inpatient Follow Up Note  HPI:  [name] is a 54 year old female with a past medical history significant for heart failure, peripheral arterial disease requiring bilateral above-the-knee amputations, tobacco abuse, anxiety, arthritis, depression, GERD, hypertension, and type 2 diabetes mellitus. [name] was admitted to [facility] from [facility] on January 24 in the setting of progressive renal failure. Palliative care has been asked to support additional goals of care conversations.   Today's Discussion 09/01/2024  I reviewed the chart notes including nursing notes from Dr. Noralee, Dr. Janna - Nephrology, Isaiah Poe Wendolyn - PT, Alan Snuffer - SLP, & Armida, MSW. I also reviewed vital signs which are stable - patient remains on RA, nursing flowsheets  needs 1:1 assistance with meals but PO's have been variable, medication administrations record, labs from this morning inclusive of BMP - Na 128. No imaging studies in the last 24 hours.  I met with [name] this morning. She arouses and know who she is and where she is. She shares with me that her bottom is hurting this morning. We discussed that she has a significant pressure ulcer which is the main contributor to her discomfort. I shared my concerns in the setting of her poor oral intake. She fell asleep during my time speaking to her.  I was able to come back to bedside this later afternoon. I met with patients sister, [name] I brought up concerns associated with [name]'s adult FTT, immobility, and post stroke delirium.   We reviewed that [name] feels patient is more awake since reducing pain medications. She feels that patient will arouse and eat for she or her family. She shares that [name] is very stubborn and won't do anything unless she is pushed. We discussed the reasoning why we do not push people in the hospital and how we really cannot push. I shared we would offer food regularly and try to  work on 1:1 feeding. We did determine artificial nutrition via a g-tube or ngt would not benefit the patient she would dislodge it.   We discussed [name]'s sacral wound and the potential outcomes of this in the short and long term. We reviewed potential for recurrent infection. We also discussed should the infection go to the hone the difficulty associated with treating this.  Patients sister shares ongoing hope for the patient to transition to SNF and improve. We reviewed the relationship between nutrition, physical ability, and cognitive abilities to success in the SNF environment. Patients sister endorses understanding. She shares the hope to try and if patient neglects to do well the idea of bringing her home with hospice care. She is in agreement with Op Palliative support in the meanwhile.   Questions and concerns addressed/Palliative Support Provided.   Objective Assessment: Vital Signs Vitals:   09/01/24 0836 09/01/24 1225  BP: (!) 138/54 (!) 128/58  Pulse: 68 63  Resp: 16 15  Temp: (!) 96.3 F (35.7 C) 98.4 F (36.9 C)  SpO2: 98% 97%    Intake/Output Summary (Last 24 hours) at 09/01/2024 1512 Last data filed at 09/01/2024 0837 Gross per 24 hour  Intake 474 ml  Output 575 ml  Net -101 ml   Last Weight  Most recent update: 08/31/2024  6:16 AM    Weight  73 kg (161 lb)            Gen: Middle-age Caucasian female chronically ill in appearance HEENT: moist mucous membranes CV: Regular rate and rhythm. PULM: On room air breathing  is even and nonlabored ABD: soft/nontender  EXT: Bilateral AKA's Neuro: Aware of self and place, expressive aphasia  SUMMARY OF RECOMMENDATIONS   DNAR/DNI  No artificial tube feeding  Continue current care allowing time for outcomes  Discussed potential outcomes in the setting of patients acute stroke and new sacral ulcer  Sacral Pain: Tylenol  1g PO TID Norco 2.5-5mg  PO Q4H PRN for moderate-severe pain  Plan for transition to [name]  once accepted  OP Palliative support on discharge  Ongoing palliative medicine team support as needed ______________________________________________________________________________________ [name] Palliative Medicine Team Team Cell Phone: [phone] Please utilize secure chat with additional questions, if there is no response within 30 minutes please call the above phone number  I personally spent a total of 68 minutes in the care of the patient today including preparing to see the patient, performing a medically appropriate exam/evaluation, referring and communicating with other health care professionals, documenting clinical information in the EHR, and coordinating care.      "

## 2024-09-02 NOTE — Progress Notes (Incomplete)
 " PROGRESS NOTE    [name]  FMW:[id number] DOB: Mar 21, 1971 DOA: 08/19/2024 PCP: Skillman, Katherine E, PA-C (Confirm with patient/family/NH records and if not entered, this HAS to be entered at [facility] point of entry. No PCP if truly none.)   No chief complaint on file.   Brief Narrative: (Start on day 1 of progress note - keep it brief and live) ***   Assessment & Plan:   Principal Problem:   Cerebrovascular accident (CVA) due to embolism of cerebral artery (HCC) Active Problems:   Chronic kidney disease, stage 3b (HCC)   Acute on chronic diastolic (congestive) heart failure (HCC)   Hypertension   Type 2 diabetes mellitus with hyperlipidemia (HCC)   Status post below-knee amputation of both lower extremities (HCC)   COPD (chronic obstructive pulmonary disease) (HCC)   ***   DVT prophylaxis: (Lovenox/Heparin /SCD's/anticoagulated/None (if comfort care) Code Status: (Full/Partial - specify details) Family Communication: (Specify name, relationship & date discussed. NO discussed with patient) Disposition:   Status is: Inpatient {Inpatient:23812}   Consultants:  ***  Procedures: (Don't include imaging studies which can be auto populated. Include things that cannot be auto populated i.e. Echo, Carotid and venous dopplers, Foley, Bipap, HD, tubes/drains, wound vac, central lines etc) CT abdomen pelvis 08/25/2024 MRI brain 08/19/2024 Renal ultrasound 08/20/2024 2D echo 08/20/2024  Transcranial Dopplers 08/20/2024 Carotid ultrasound 08/20/2018  Antimicrobials:  Anti-infectives (From admission, onward)    Start     Dose/Rate Route Frequency Ordered Stop   08/29/24 1100  vancomycin  variable dose per unstable renal function (pharmacist dosing)  Status:  Discontinued         Does not apply See admin instructions 08/29/24 1100 08/30/24 1658   08/27/24 1000  ceFEPIme  (MAXIPIME ) 2 g in sodium chloride  0.9 % 100 mL IVPB  Status:  Discontinued        2 g 200 mL/hr over 30  Minutes Intravenous Every 12 hours 08/27/24 0902 08/30/24 1658   08/27/24 0830  vancomycin  (VANCOCIN ) IVPB 1000 mg/200 mL premix  Status:  Discontinued        1,000 mg 200 mL/hr over 60 Minutes Intravenous Every 24 hours 08/26/24 0732 08/29/24 1100   08/26/24 0830  vancomycin  (VANCOREADY) IVPB 1500 mg/300 mL        1,500 mg 150 mL/hr over 120 Minutes Intravenous  Once 08/26/24 0732 08/27/24 0730   08/25/24 1900  Ampicillin -Sulbactam (UNASYN ) 3 g in sodium chloride  0.9 % 100 mL IVPB  Status:  Discontinued        3 g 200 mL/hr over 30 Minutes Intravenous Every 6 hours 08/25/24 1811 08/27/24 0853         Subjective: Patient laying in bed alert.  Denies any chest pain or shortness of breath.  Patient with complaints of pain in her back and her sacral region.  Objective: Vitals:   08/31/24 2340 09/01/24 0354 09/01/24 0836 09/01/24 1225  BP: (!) 150/59 (!) 142/70 (!) 138/54 (!) 128/58  Pulse: 69  68 63  Resp: 17 16 16 15   Temp: 97.6 F (36.4 C) 97.7 F (36.5 C) (!) 96.3 F (35.7 C) 98.4 F (36.9 C)  TempSrc: Axillary Oral Oral Oral  SpO2: 95%  98% 97%  Weight:      Height:        Intake/Output Summary (Last 24 hours) at 09/01/2024 1459 Last data filed at 09/01/2024 0837 Gross per 24 hour  Intake 474 ml  Output 575 ml  Net -101 ml   [employer]  08/29/24 0358 08/30/24 0400 08/31/24 0615  Weight: 70.4 kg 74.8 kg 73 kg    Examination:  General exam: Appears calm and comfortable  Respiratory system: Clear to auscultation.  No wheezes, no crackles, no rhonchi.  Fair air movement.  Speaking in full sentences respiratory effort normal. Cardiovascular system: S1 & S2 heard, RRR. No JVD, murmurs, rubs, gallops or clicks. No pedal edema. Gastrointestinal system: Abdomen is nondistended, soft and nontender. No organomegaly or masses felt. Normal bowel sounds heard. Central nervous system: Alert and oriented. No focal neurological deficits. Extremities: Status post bilateral  AKA. Skin: No rashes, lesions or ulcers Psychiatry: Judgement and insight appear normal. Mood & affect appropriate.     Data Reviewed: I have personally reviewed following labs and imaging studies  CBC: Recent Labs  Lab 08/26/24 0418 08/28/24 1042 08/30/24 0714 08/31/24 0454 09/01/24 0450  WBC 16.6* 16.5* 12.4* 11.0* 10.2  HGB 9.2* 8.1* 7.8* 8.3* 7.9*  HCT 27.9* 25.0* 23.6* 25.2* 23.9*  MCV 91.8 92.3 92.2 92.6 93.0  PLT 194 220 223 233 238    Basic Metabolic Panel: Recent Labs  Lab 08/26/24 0418 08/27/24 0237 08/28/24 0238 08/29/24 0855 08/30/24 0714 08/31/24 0454 09/01/24 0450  NA 123*   < > 125* 126* 127* 127* 128*  K 4.9   < > 4.8 4.8 4.1 4.8 4.3  CL 90*   < > 90* 92* 92* 91* 92*  CO2 23   < > 20* 19* 22 21* 23  GLUCOSE 221*   < > 174* 92 125* 323* 123*  BUN 41*   < > 48* 51* 53* 58* 58*  CREATININE 1.36*   < > 1.73* 1.88* 1.80* 1.92* 1.86*  CALCIUM  7.6*   < > 8.4* 8.6* 8.6* 8.7* 8.6*  MG 2.1  --  2.3  --   --   --   --   PHOS  --   --   --   --   --  4.1 3.7   < > = values in this interval not displayed.    GFR: Estimated Creatinine Clearance: 34.5 mL/min (A) (by C-G formula based on SCr of 1.86 mg/dL (H)).  Liver Function Tests: Recent Labs  Lab 08/26/24 0941 08/31/24 0454 09/01/24 0450  AST 20  --   --   ALT 18  --   --   ALKPHOS 106  --   --   BILITOT 0.5  --   --   PROT 5.4*  --   --   ALBUMIN  3.0* 3.7 3.6    CBG: Recent Labs  Lab 08/31/24 1141 08/31/24 1621 08/31/24 2158 09/01/24 0832 09/01/24 1224  GLUCAP 202* 82 245* 141* 178*     No results found for this or any previous visit (from the past 240 hours).       Radiology Studies: No results found.      Scheduled Meds:  acetaminophen   1,000 mg Oral TID   aspirin  EC  81 mg Oral Daily   carvedilol   3.125 mg Oral BID WC   citalopram   20 mg Oral Daily   clopidogrel   75 mg Oral Daily   collagenase    Topical Daily   DULoxetine   60 mg Oral Daily   ezetimibe   10  mg Oral Daily   heparin   5,000 Units Subcutaneous Q8H   insulin  aspart  0-5 Units Subcutaneous QHS   insulin  aspart  0-9 Units Subcutaneous TID WC   insulin  aspart  5 Units Subcutaneous TID WC  insulin  glargine  15 Units Subcutaneous Daily   lactose free nutrition  237 mL Oral TID WC   multivitamin with minerals  1 tablet Oral Daily   nicotine   21 mg Transdermal Daily   rosuvastatin   40 mg Oral QPM   torsemide   100 mg Oral BID   Continuous Infusions:   LOS: 13 days    Time spent: 35 minutes    Toribio Hummer, MD [facility]   To contact the attending provider between 7A-7P or the covering provider during after hours 7P-7A, please log into the web site [email or web address] and access using universal [place] password for that web site. If you do not have the password, please call the hospital operator.  09/01/2024, 2:59 PM    "

## 2024-09-05 ENCOUNTER — Encounter

## 2024-09-18 ENCOUNTER — Ambulatory Visit: Admitting: Cardiology
